# Patient Record
Sex: Male | Born: 1945 | Race: White | Hispanic: No | Marital: Married | Smoking: Never smoker
Health system: Southern US, Community
[De-identification: ages and names within clinical notes are randomized; demographics above are authoritative.]

## PROBLEM LIST (undated history)

## (undated) DIAGNOSIS — I509 Heart failure, unspecified: Secondary | ICD-10-CM

## (undated) DIAGNOSIS — J439 Emphysema, unspecified: Secondary | ICD-10-CM

## (undated) DIAGNOSIS — I2510 Atherosclerotic heart disease of native coronary artery without angina pectoris: Secondary | ICD-10-CM

## (undated) DIAGNOSIS — K2270 Barrett's esophagus without dysplasia: Secondary | ICD-10-CM

## (undated) DIAGNOSIS — A048 Other specified bacterial intestinal infections: Secondary | ICD-10-CM

## (undated) DIAGNOSIS — D132 Benign neoplasm of duodenum: Secondary | ICD-10-CM

## (undated) DIAGNOSIS — C884 Extranodal marginal zone b-cell lymphoma of mucosa-associated lymphoid tissue (malt-lymphoma) not having achieved remission: Secondary | ICD-10-CM

## (undated) DIAGNOSIS — R7303 Prediabetes: Secondary | ICD-10-CM

## (undated) DIAGNOSIS — K649 Unspecified hemorrhoids: Secondary | ICD-10-CM

## (undated) DIAGNOSIS — I5030 Unspecified diastolic (congestive) heart failure: Secondary | ICD-10-CM

## (undated) DIAGNOSIS — Z87448 Personal history of other diseases of urinary system: Secondary | ICD-10-CM

## (undated) DIAGNOSIS — I10 Essential (primary) hypertension: Secondary | ICD-10-CM

## (undated) DIAGNOSIS — N400 Enlarged prostate without lower urinary tract symptoms: Secondary | ICD-10-CM

## (undated) DIAGNOSIS — M109 Gout, unspecified: Secondary | ICD-10-CM

## (undated) DIAGNOSIS — K635 Polyp of colon: Secondary | ICD-10-CM

## (undated) DIAGNOSIS — K219 Gastro-esophageal reflux disease without esophagitis: Secondary | ICD-10-CM

## (undated) DIAGNOSIS — K449 Diaphragmatic hernia without obstruction or gangrene: Secondary | ICD-10-CM

## (undated) DIAGNOSIS — F419 Anxiety disorder, unspecified: Secondary | ICD-10-CM

## (undated) DIAGNOSIS — K259 Gastric ulcer, unspecified as acute or chronic, without hemorrhage or perforation: Secondary | ICD-10-CM

## (undated) DIAGNOSIS — I5032 Chronic diastolic (congestive) heart failure: Secondary | ICD-10-CM

## (undated) DIAGNOSIS — I700 Atherosclerosis of aorta: Secondary | ICD-10-CM

## (undated) DIAGNOSIS — K2970 Gastritis, unspecified, without bleeding: Secondary | ICD-10-CM

## (undated) HISTORY — DX: Diaphragmatic hernia without obstruction or gangrene: K44.9

## (undated) HISTORY — PX: OTHER SURGICAL HISTORY: SHX169

## (undated) HISTORY — PX: EYE SURGERY: SHX253

## (undated) HISTORY — DX: Polyp of colon: K63.5

## (undated) HISTORY — DX: Gastro-esophageal reflux disease without esophagitis: K21.9

## (undated) HISTORY — DX: Unspecified diastolic (congestive) heart failure: I50.30

## (undated) HISTORY — DX: Barrett's esophagus without dysplasia: K22.70

## (undated) HISTORY — PX: CORONARY ANGIOPLASTY: SHX604

## (undated) HISTORY — PX: TONSILLECTOMY: SUR1361

## (undated) HISTORY — PX: COLONOSCOPY: SHX174

## (undated) HISTORY — DX: Essential (primary) hypertension: I10

## (undated) HISTORY — DX: Gout, unspecified: M10.9

## (undated) HISTORY — DX: Atherosclerotic heart disease of native coronary artery without angina pectoris: I25.10

## (undated) HISTORY — PX: CARDIAC CATHETERIZATION: SHX172

---
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
## 2008-01-01 ENCOUNTER — Ambulatory Visit: Payer: Self-pay | Admitting: Gastroenterology

## 2008-03-25 ENCOUNTER — Emergency Department: Payer: Self-pay | Admitting: Emergency Medicine

## 2008-03-25 ENCOUNTER — Other Ambulatory Visit: Payer: Self-pay

## 2010-04-12 ENCOUNTER — Ambulatory Visit: Payer: Self-pay | Admitting: Family Medicine

## 2010-05-20 ENCOUNTER — Ambulatory Visit: Payer: Self-pay | Admitting: Cardiovascular Disease

## 2010-06-08 ENCOUNTER — Ambulatory Visit: Payer: Self-pay | Admitting: Family Medicine

## 2010-06-22 ENCOUNTER — Ambulatory Visit: Payer: Self-pay | Admitting: Gastroenterology

## 2010-06-24 LAB — PATHOLOGY REPORT

## 2011-02-26 ENCOUNTER — Emergency Department: Payer: Self-pay | Admitting: Internal Medicine

## 2011-03-02 ENCOUNTER — Ambulatory Visit: Payer: Self-pay | Admitting: Orthopedic Surgery

## 2011-03-07 ENCOUNTER — Ambulatory Visit: Payer: Self-pay | Admitting: Orthopedic Surgery

## 2011-11-27 ENCOUNTER — Ambulatory Visit: Payer: Self-pay | Admitting: Family Medicine

## 2012-01-03 DIAGNOSIS — I10 Essential (primary) hypertension: Secondary | ICD-10-CM | POA: Diagnosis not present

## 2012-03-13 DIAGNOSIS — R509 Fever, unspecified: Secondary | ICD-10-CM | POA: Diagnosis not present

## 2012-03-13 DIAGNOSIS — R51 Headache: Secondary | ICD-10-CM | POA: Diagnosis not present

## 2012-03-13 DIAGNOSIS — R070 Pain in throat: Secondary | ICD-10-CM | POA: Diagnosis not present

## 2012-03-13 DIAGNOSIS — J019 Acute sinusitis, unspecified: Secondary | ICD-10-CM | POA: Diagnosis not present

## 2012-03-20 DIAGNOSIS — R0982 Postnasal drip: Secondary | ICD-10-CM | POA: Diagnosis not present

## 2012-03-20 DIAGNOSIS — H9319 Tinnitus, unspecified ear: Secondary | ICD-10-CM | POA: Diagnosis not present

## 2012-03-20 DIAGNOSIS — J018 Other acute sinusitis: Secondary | ICD-10-CM | POA: Diagnosis not present

## 2012-03-20 DIAGNOSIS — H903 Sensorineural hearing loss, bilateral: Secondary | ICD-10-CM | POA: Diagnosis not present

## 2012-05-02 DIAGNOSIS — K219 Gastro-esophageal reflux disease without esophagitis: Secondary | ICD-10-CM | POA: Diagnosis not present

## 2012-05-02 DIAGNOSIS — Z0000 Encounter for general adult medical examination without abnormal findings: Secondary | ICD-10-CM | POA: Diagnosis not present

## 2012-05-02 DIAGNOSIS — I10 Essential (primary) hypertension: Secondary | ICD-10-CM | POA: Diagnosis not present

## 2012-05-02 DIAGNOSIS — Z125 Encounter for screening for malignant neoplasm of prostate: Secondary | ICD-10-CM | POA: Diagnosis not present

## 2012-05-02 DIAGNOSIS — K228 Other specified diseases of esophagus: Secondary | ICD-10-CM | POA: Diagnosis not present

## 2012-07-17 DIAGNOSIS — K219 Gastro-esophageal reflux disease without esophagitis: Secondary | ICD-10-CM | POA: Diagnosis not present

## 2012-10-29 DIAGNOSIS — Z23 Encounter for immunization: Secondary | ICD-10-CM | POA: Diagnosis not present

## 2012-10-29 DIAGNOSIS — I10 Essential (primary) hypertension: Secondary | ICD-10-CM | POA: Diagnosis not present

## 2012-11-26 DIAGNOSIS — E875 Hyperkalemia: Secondary | ICD-10-CM | POA: Diagnosis not present

## 2013-01-03 DIAGNOSIS — J019 Acute sinusitis, unspecified: Secondary | ICD-10-CM | POA: Diagnosis not present

## 2013-01-16 DIAGNOSIS — J018 Other acute sinusitis: Secondary | ICD-10-CM | POA: Diagnosis not present

## 2013-04-09 ENCOUNTER — Ambulatory Visit: Payer: Self-pay | Admitting: Gastroenterology

## 2013-04-09 DIAGNOSIS — Z79899 Other long term (current) drug therapy: Secondary | ICD-10-CM | POA: Diagnosis not present

## 2013-04-09 DIAGNOSIS — I10 Essential (primary) hypertension: Secondary | ICD-10-CM | POA: Diagnosis not present

## 2013-04-09 DIAGNOSIS — K5730 Diverticulosis of large intestine without perforation or abscess without bleeding: Secondary | ICD-10-CM | POA: Diagnosis not present

## 2013-04-09 DIAGNOSIS — Z7982 Long term (current) use of aspirin: Secondary | ICD-10-CM | POA: Diagnosis not present

## 2013-04-09 DIAGNOSIS — Z09 Encounter for follow-up examination after completed treatment for conditions other than malignant neoplasm: Secondary | ICD-10-CM | POA: Diagnosis not present

## 2013-04-09 DIAGNOSIS — K2270 Barrett's esophagus without dysplasia: Secondary | ICD-10-CM | POA: Diagnosis not present

## 2013-04-09 DIAGNOSIS — Z1211 Encounter for screening for malignant neoplasm of colon: Secondary | ICD-10-CM | POA: Diagnosis not present

## 2013-04-09 DIAGNOSIS — K219 Gastro-esophageal reflux disease without esophagitis: Secondary | ICD-10-CM | POA: Diagnosis not present

## 2013-04-09 DIAGNOSIS — K449 Diaphragmatic hernia without obstruction or gangrene: Secondary | ICD-10-CM | POA: Diagnosis not present

## 2013-04-09 DIAGNOSIS — Z86010 Personal history of colonic polyps: Secondary | ICD-10-CM | POA: Diagnosis not present

## 2013-04-10 LAB — PATHOLOGY REPORT

## 2013-05-07 DIAGNOSIS — Z0000 Encounter for general adult medical examination without abnormal findings: Secondary | ICD-10-CM | POA: Diagnosis not present

## 2013-05-07 DIAGNOSIS — M545 Low back pain: Secondary | ICD-10-CM | POA: Diagnosis not present

## 2013-05-07 DIAGNOSIS — K219 Gastro-esophageal reflux disease without esophagitis: Secondary | ICD-10-CM | POA: Diagnosis not present

## 2013-05-08 DIAGNOSIS — E785 Hyperlipidemia, unspecified: Secondary | ICD-10-CM | POA: Diagnosis not present

## 2013-05-08 DIAGNOSIS — I10 Essential (primary) hypertension: Secondary | ICD-10-CM | POA: Diagnosis not present

## 2013-05-08 DIAGNOSIS — Z0000 Encounter for general adult medical examination without abnormal findings: Secondary | ICD-10-CM | POA: Diagnosis not present

## 2013-05-08 DIAGNOSIS — Z125 Encounter for screening for malignant neoplasm of prostate: Secondary | ICD-10-CM | POA: Diagnosis not present

## 2013-07-28 DIAGNOSIS — M545 Low back pain: Secondary | ICD-10-CM | POA: Diagnosis not present

## 2013-10-14 DIAGNOSIS — M7650 Patellar tendinitis, unspecified knee: Secondary | ICD-10-CM | POA: Diagnosis not present

## 2013-11-03 DIAGNOSIS — S83419A Sprain of medial collateral ligament of unspecified knee, initial encounter: Secondary | ICD-10-CM | POA: Diagnosis not present

## 2013-11-04 DIAGNOSIS — S83419A Sprain of medial collateral ligament of unspecified knee, initial encounter: Secondary | ICD-10-CM | POA: Diagnosis not present

## 2013-11-05 DIAGNOSIS — I10 Essential (primary) hypertension: Secondary | ICD-10-CM | POA: Diagnosis not present

## 2013-11-05 DIAGNOSIS — Z23 Encounter for immunization: Secondary | ICD-10-CM | POA: Diagnosis not present

## 2013-11-11 DIAGNOSIS — S83419A Sprain of medial collateral ligament of unspecified knee, initial encounter: Secondary | ICD-10-CM | POA: Diagnosis not present

## 2013-11-13 DIAGNOSIS — S83419A Sprain of medial collateral ligament of unspecified knee, initial encounter: Secondary | ICD-10-CM | POA: Diagnosis not present

## 2013-11-18 DIAGNOSIS — S83419A Sprain of medial collateral ligament of unspecified knee, initial encounter: Secondary | ICD-10-CM | POA: Diagnosis not present

## 2013-11-20 DIAGNOSIS — S83419A Sprain of medial collateral ligament of unspecified knee, initial encounter: Secondary | ICD-10-CM | POA: Diagnosis not present

## 2013-12-02 DIAGNOSIS — M25569 Pain in unspecified knee: Secondary | ICD-10-CM | POA: Diagnosis not present

## 2013-12-02 DIAGNOSIS — M6281 Muscle weakness (generalized): Secondary | ICD-10-CM | POA: Diagnosis not present

## 2013-12-09 DIAGNOSIS — J019 Acute sinusitis, unspecified: Secondary | ICD-10-CM | POA: Diagnosis not present

## 2013-12-17 DIAGNOSIS — M1710 Unilateral primary osteoarthritis, unspecified knee: Secondary | ICD-10-CM | POA: Diagnosis not present

## 2013-12-17 DIAGNOSIS — S83419A Sprain of medial collateral ligament of unspecified knee, initial encounter: Secondary | ICD-10-CM | POA: Diagnosis not present

## 2014-01-06 ENCOUNTER — Emergency Department: Payer: Self-pay | Admitting: Emergency Medicine

## 2014-01-06 DIAGNOSIS — Z79899 Other long term (current) drug therapy: Secondary | ICD-10-CM | POA: Diagnosis not present

## 2014-01-06 DIAGNOSIS — I10 Essential (primary) hypertension: Secondary | ICD-10-CM | POA: Diagnosis not present

## 2014-01-06 DIAGNOSIS — R000 Tachycardia, unspecified: Secondary | ICD-10-CM | POA: Diagnosis not present

## 2014-01-06 DIAGNOSIS — R002 Palpitations: Secondary | ICD-10-CM | POA: Diagnosis not present

## 2014-01-06 LAB — CBC
HCT: 42.5 % (ref 40.0–52.0)
HGB: 14.6 g/dL (ref 13.0–18.0)
MCH: 32.4 pg (ref 26.0–34.0)
MCHC: 34.3 g/dL (ref 32.0–36.0)
MCV: 95 fL (ref 80–100)
PLATELETS: 260 10*3/uL (ref 150–440)
RBC: 4.5 10*6/uL (ref 4.40–5.90)
RDW: 13.6 % (ref 11.5–14.5)
WBC: 7.8 10*3/uL (ref 3.8–10.6)

## 2014-01-06 LAB — TSH: Thyroid Stimulating Horm: 1.2 u[IU]/mL

## 2014-01-06 LAB — BASIC METABOLIC PANEL
ANION GAP: 6 — AB (ref 7–16)
BUN: 22 mg/dL — ABNORMAL HIGH (ref 7–18)
CHLORIDE: 101 mmol/L (ref 98–107)
CO2: 27 mmol/L (ref 21–32)
Calcium, Total: 9.7 mg/dL (ref 8.5–10.1)
Creatinine: 1.13 mg/dL (ref 0.60–1.30)
EGFR (African American): >60
GLUCOSE: 113 mg/dL — AB (ref 65–99)
OSMOLALITY: 272 (ref 275–301)
POTASSIUM: 4 mmol/L (ref 3.5–5.1)
SODIUM: 134 mmol/L — AB (ref 136–145)

## 2014-01-06 LAB — T4, FREE: Free Thyroxine: 1.15 ng/dL (ref 0.76–1.46)

## 2014-01-26 DIAGNOSIS — I10 Essential (primary) hypertension: Secondary | ICD-10-CM | POA: Diagnosis not present

## 2014-02-04 DIAGNOSIS — J018 Other acute sinusitis: Secondary | ICD-10-CM | POA: Diagnosis not present

## 2014-03-10 DIAGNOSIS — I10 Essential (primary) hypertension: Secondary | ICD-10-CM | POA: Diagnosis not present

## 2014-05-12 DIAGNOSIS — Z125 Encounter for screening for malignant neoplasm of prostate: Secondary | ICD-10-CM | POA: Diagnosis not present

## 2014-05-12 DIAGNOSIS — Z23 Encounter for immunization: Secondary | ICD-10-CM | POA: Diagnosis not present

## 2014-05-12 DIAGNOSIS — Z0000 Encounter for general adult medical examination without abnormal findings: Secondary | ICD-10-CM | POA: Diagnosis not present

## 2014-05-12 DIAGNOSIS — I10 Essential (primary) hypertension: Secondary | ICD-10-CM | POA: Diagnosis not present

## 2014-11-10 DIAGNOSIS — I10 Essential (primary) hypertension: Secondary | ICD-10-CM | POA: Diagnosis not present

## 2014-11-10 DIAGNOSIS — Z23 Encounter for immunization: Secondary | ICD-10-CM | POA: Diagnosis not present

## 2015-05-17 ENCOUNTER — Encounter: Payer: Self-pay | Admitting: Family Medicine

## 2015-05-25 ENCOUNTER — Ambulatory Visit (INDEPENDENT_AMBULATORY_CARE_PROVIDER_SITE_OTHER): Payer: Medicare Other | Admitting: Family Medicine

## 2015-05-25 ENCOUNTER — Encounter: Payer: Self-pay | Admitting: Family Medicine

## 2015-05-25 VITALS — BP 132/78 | HR 92 | Temp 98.7°F | Ht 69.0 in | Wt 238.0 lb

## 2015-05-25 DIAGNOSIS — K2270 Barrett's esophagus without dysplasia: Secondary | ICD-10-CM | POA: Diagnosis not present

## 2015-05-25 DIAGNOSIS — Z125 Encounter for screening for malignant neoplasm of prostate: Secondary | ICD-10-CM

## 2015-05-25 DIAGNOSIS — I10 Essential (primary) hypertension: Secondary | ICD-10-CM | POA: Diagnosis not present

## 2015-05-25 DIAGNOSIS — Z0001 Encounter for general adult medical examination with abnormal findings: Secondary | ICD-10-CM | POA: Diagnosis not present

## 2015-05-25 DIAGNOSIS — F419 Anxiety disorder, unspecified: Secondary | ICD-10-CM | POA: Insufficient documentation

## 2015-05-25 DIAGNOSIS — M5441 Lumbago with sciatica, right side: Secondary | ICD-10-CM

## 2015-05-25 DIAGNOSIS — K229 Disease of esophagus, unspecified: Secondary | ICD-10-CM | POA: Insufficient documentation

## 2015-05-25 MED ORDER — METOPROLOL TARTRATE 25 MG PO TABS: 25.0000 mg | ORAL_TABLET | Freq: Two times a day (BID) | ORAL | Status: DC

## 2015-05-25 MED ORDER — CELECOXIB 200 MG PO CAPS: 200.0000 mg | ORAL_CAPSULE | Freq: Every day | ORAL | Status: DC

## 2015-05-25 MED ORDER — OLMESARTAN-AMLODIPINE-HCTZ 40-10-12.5 MG PO TABS: 1.0000 | ORAL_TABLET | Freq: Every morning | ORAL | Status: DC

## 2015-05-25 MED ORDER — LORAZEPAM 1 MG PO TABS: 1.0000 mg | ORAL_TABLET | Freq: Every day | ORAL | Status: DC | PRN

## 2015-05-25 NOTE — Assessment & Plan Note (Signed)
The current medical regimen is effective;  continue present plan and medications.  

## 2015-05-25 NOTE — Addendum Note (Signed)
Addended by: Amado Coe on: 05/25/2015 04:17 PM   Modules accepted: Orders

## 2015-05-25 NOTE — Progress Notes (Addendum)
BP 132/78 mmHg  Pulse 92  Temp(Src) 98.7 F (37.1 C)  Ht 5\' 9"  (1.753 m)  Wt 238 lb (107.956 kg)  BMI 35.13 kg/m2  SpO2 98%   Subjective:    Patient ID: Jesse Norton, male    DOB: [birth year], 69 y.o.   MRN: [id number]  HPI: Jesse Norton is a 69 y.o. male  Chief Complaint  Patient presents with  . Annual Exam  . Back Pain  rt lower back and hip worse with walking not with heavy back pack cellebrex has helped BP doing well meds taking every day and no side effects Reflux stable Anxiety l;orezapam did well and out of meds wants refill   Relevant past medical, surgical, family and social history reviewed and updated as indicated. Interim medical history since our last visit reviewed. Allergies and medications reviewed and updated.  Review of Systems  Constitutional: Negative.   HENT: Negative.   Eyes: Negative.   Respiratory: Negative.   Cardiovascular: Negative.   Gastrointestinal: Negative.   Endocrine: Negative.   Genitourinary: Negative.   Musculoskeletal: Negative.   Skin: Negative.   Allergic/Immunologic: Negative.   Neurological: Negative.   Hematological: Negative.   Psychiatric/Behavioral: Negative.     Per HPI unless specifically indicated above     Objective:    BP 132/78 mmHg  Pulse 92  Temp(Src) 98.7 F (37.1 C)  Ht 5\' 9"  (1.753 m)  Wt 238 lb (107.956 kg)  BMI 35.13 kg/m2  SpO2 98%  Wt Readings from Last 3 Encounters:  05/25/15 238 lb (107.956 kg)  11/10/14 239 lb (108.41 kg)  04/09/15 239 lb (108.41 kg)    Physical Exam  Constitutional: He is oriented to person, place, and time. He appears well-developed and well-nourished.  HENT:  Head: Normocephalic and atraumatic.  Right Ear: External ear normal.  Left Ear: External ear normal.  Eyes: Conjunctivae and EOM are normal. Pupils are equal, round, and reactive to light.  Neck: Normal range of motion. Neck supple.  Cardiovascular: Normal rate, regular rhythm, normal heart sounds  and intact distal pulses.   No murmur heard. Pulmonary/Chest: Effort normal and breath sounds normal.  Abdominal: Soft. Bowel sounds are normal. There is no splenomegaly or hepatomegaly.  Genitourinary: Rectum normal, prostate normal and penis normal.  Musculoskeletal: Normal range of motion.  Neurological: He is alert and oriented to person, place, and time. He has normal reflexes.  Skin: No rash noted. No erythema.  Psychiatric: He has a normal mood and affect. His behavior is normal. Judgment and thought content normal.        Assessment & Plan:   Problem List Items Addressed This Visit      Cardiovascular and Mediastinum   Hypertension - Primary    The current medical regimen is effective;  continue present plan and medications.       Relevant Medications   metoprolol tartrate (LOPRESSOR) 25 MG tablet   Olmesartan-Amlodipine-HCTZ 40-10-12.5 MG TABS   Other Relevant Orders   CBC with Differential/Platelet   Comprehensive metabolic panel   Lipid panel   TSH   Urinalysis, Routine w reflex microscopic (not at [facility])   Basic metabolic panel     Digestive   Barrett's esophagus    .Marland KitchenMarland KitchenThe current medical regimen is effective;  continue present plan and medications.       Relevant Orders   CBC with Differential/Platelet   Comprehensive metabolic panel   TSH   Urinalysis, Routine w reflex microscopic (not at [facility])  Other   Acute anxiety    Good tx needed for mother-in-law 78 lasted a year      Relevant Medications   LORazepam (ATIVAN) 1 MG tablet   Other Relevant Orders   CBC with Differential/Platelet   Comprehensive metabolic panel   TSH   Urinalysis, Routine w reflex microscopic (not at [facility])    Other Visit Diagnoses    Right-sided low back pain with right-sided sciatica        discussed cont celebrex and will get apt chiropractor     Relevant Medications    celecoxib (CELEBREX) 200 MG capsule    Prostate cancer screening        Screening for prostate  cancer            Follow up plan: Return in about 6 months (around 11/24/2015) for BP.

## 2015-05-25 NOTE — Addendum Note (Signed)
Addended byGolden Pop on: 05/25/2015 04:08 PM   Modules accepted: Miquel Dunn

## 2015-05-25 NOTE — Assessment & Plan Note (Signed)
Good tx needed for mother-in-law 29 lasted a year

## 2015-05-26 LAB — CBC WITH DIFFERENTIAL/PLATELET
BASOS: 1 %
Basophils Absolute: 0.1 10*3/uL (ref 0.0–0.2)
EOS (ABSOLUTE): 0.1 10*3/uL (ref 0.0–0.4)
EOS: 2 %
HEMATOCRIT: 42.5 % (ref 37.5–51.0)
HEMOGLOBIN: 14.6 g/dL (ref 12.6–17.7)
IMMATURE GRANS (ABS): 0 10*3/uL (ref 0.0–0.1)
IMMATURE GRANULOCYTES: 0 %
LYMPHS: 44 %
Lymphocytes Absolute: 3.2 10*3/uL — ABNORMAL HIGH (ref 0.7–3.1)
MCH: 32 pg (ref 26.6–33.0)
MCHC: 34.4 g/dL (ref 31.5–35.7)
MCV: 93 fL (ref 79–97)
MONOS ABS: 0.7 10*3/uL (ref 0.1–0.9)
Monocytes: 10 %
NEUTROS ABS: 3.2 10*3/uL (ref 1.4–7.0)
Neutrophils: 43 %
Platelets: 251 10*3/uL (ref 150–379)
RBC: 4.56 x10E6/uL (ref 4.14–5.80)
RDW: 14.4 % (ref 12.3–15.4)
WBC: 7.3 10*3/uL (ref 3.4–10.8)

## 2015-05-26 LAB — COMPREHENSIVE METABOLIC PANEL
ALBUMIN: 4.5 g/dL (ref 3.5–4.8)
ALT: 14 IU/L (ref 0–44)
AST: 22 IU/L (ref 0–40)
Albumin/Globulin Ratio: 2 (ref 1.1–2.5)
Alkaline Phosphatase: 47 IU/L (ref 39–117)
BUN/Creatinine Ratio: 22 (ref 10–22)
BUN: 22 mg/dL (ref 8–27)
Bilirubin Total: 0.6 mg/dL (ref 0.0–1.2)
CALCIUM: 10 mg/dL (ref 8.6–10.2)
CO2: 26 mmol/L (ref 18–29)
CREATININE: 1.02 mg/dL (ref 0.76–1.27)
Chloride: 98 mmol/L (ref 97–108)
GFR calc Af Amer: 86 mL/min/{1.73_m2} (ref >59)
GFR calc non Af Amer: 74 mL/min/{1.73_m2} (ref >59)
GLOBULIN, TOTAL: 2.3 g/dL (ref 1.5–4.5)
Glucose: 99 mg/dL (ref 65–99)
Potassium: 4.5 mmol/L (ref 3.5–5.2)
Sodium: 139 mmol/L (ref 134–144)
TOTAL PROTEIN: 6.8 g/dL (ref 6.0–8.5)

## 2015-05-26 LAB — URINALYSIS, ROUTINE W REFLEX MICROSCOPIC
BILIRUBIN UA: NEGATIVE
Glucose, UA: NEGATIVE
Ketones, UA: NEGATIVE
Leukocytes, UA: NEGATIVE
Nitrite, UA: NEGATIVE
Protein, UA: NEGATIVE
SPEC GRAV UA: 1.015 (ref 1.005–1.030)
UUROB: 0.2 mg/dL (ref 0.2–1.0)
pH, UA: 5 (ref 5.0–7.5)

## 2015-05-26 LAB — MICROSCOPIC EXAMINATION: WBC UA: NONE SEEN /HPF (ref 0–?)

## 2015-05-26 LAB — PSA: Prostate Specific Ag, Serum: 2.7 ng/mL (ref 0.0–4.0)

## 2015-05-26 LAB — LIPID PANEL
CHOLESTEROL TOTAL: 186 mg/dL (ref 100–199)
Chol/HDL Ratio: 3 ratio units (ref 0.0–5.0)
HDL: 62 mg/dL (ref >39)
LDL CALC: 109 mg/dL — AB (ref 0–99)
Triglycerides: 75 mg/dL (ref 0–149)
VLDL Cholesterol Cal: 15 mg/dL (ref 5–40)

## 2015-05-26 LAB — TSH: TSH: 1.61 u[IU]/mL (ref 0.450–4.500)

## 2015-10-21 ENCOUNTER — Encounter: Payer: Self-pay | Admitting: Family Medicine

## 2015-11-22 ENCOUNTER — Encounter: Payer: Self-pay | Admitting: Family Medicine

## 2015-11-22 ENCOUNTER — Ambulatory Visit (INDEPENDENT_AMBULATORY_CARE_PROVIDER_SITE_OTHER): Payer: Medicare Other | Admitting: Family Medicine

## 2015-11-22 VITALS — BP 136/80 | HR 79 | Temp 97.9°F | Ht 68.2 in | Wt 245.0 lb

## 2015-11-22 DIAGNOSIS — Z23 Encounter for immunization: Secondary | ICD-10-CM

## 2015-11-22 DIAGNOSIS — Z113 Encounter for screening for infections with a predominantly sexual mode of transmission: Secondary | ICD-10-CM | POA: Diagnosis not present

## 2015-11-22 DIAGNOSIS — I10 Essential (primary) hypertension: Secondary | ICD-10-CM

## 2015-11-22 DIAGNOSIS — M79675 Pain in left toe(s): Secondary | ICD-10-CM | POA: Diagnosis not present

## 2015-11-22 DIAGNOSIS — M25572 Pain in left ankle and joints of left foot: Secondary | ICD-10-CM

## 2015-11-22 DIAGNOSIS — F419 Anxiety disorder, unspecified: Secondary | ICD-10-CM

## 2015-11-22 MED ORDER — AMLODIPINE BESYLATE 10 MG PO TABS: 10.0000 mg | ORAL_TABLET | Freq: Every day | ORAL | Status: DC

## 2015-11-22 MED ORDER — LOSARTAN POTASSIUM-HCTZ 100-12.5 MG PO TABS: 1.0000 | ORAL_TABLET | Freq: Every day | ORAL | Status: DC

## 2015-11-22 MED ORDER — LORAZEPAM 1 MG PO TABS: 1.0000 mg | ORAL_TABLET | Freq: Every day | ORAL | Status: DC | PRN

## 2015-11-22 NOTE — Assessment & Plan Note (Signed)
The current medical regimen is effective;  continue present plan and medications. But because of cost will change into component medications Discussed taking metoprolol tartrate 2 tablets each morning patient will checks blood pressure in the evenings and see what it is and report back

## 2015-11-22 NOTE — Progress Notes (Signed)
BP 136/80 mmHg  Pulse 79  Temp(Src) 97.9 F (36.6 C)  Ht 5' 8.2" (1.732 m)  Wt 245 lb (111.131 kg)  BMI 37.05 kg/m2  SpO2 99%   Subjective:    Patient ID: Jesse Norton, male    DOB: 01-12-1946, 69 y.o.   MRN: [id number]  HPI: Jesse Norton is a 69 y.o. male  Chief Complaint  Patient presents with  . Hypertension   Doing well with blood pressure blood pressure medications no complaints or concerns. Taking medications without side effects except for tribenzor gotten too expensive even generic Patient also with left bunion gets somewhat red and inflamed sore and tender steak and Celebrex which is helped. No prior history of gout  Relevant past medical, surgical, family and social history reviewed and updated as indicated. Interim medical history since our last visit reviewed. Allergies and medications reviewed and updated.  Review of Systems  Constitutional: Negative.   Respiratory: Negative.   Cardiovascular: Negative.     Per HPI unless specifically indicated above     Objective:    BP 136/80 mmHg  Pulse 79  Temp(Src) 97.9 F (36.6 C)  Ht 5' 8.2" (1.732 m)  Wt 245 lb (111.131 kg)  BMI 37.05 kg/m2  SpO2 99%  Wt Readings from Last 3 Encounters:  11/22/15 245 lb (111.131 kg)  05/25/15 238 lb (107.956 kg)  11/10/14 239 lb (108.41 kg)    Physical Exam  Constitutional: He is oriented to person, place, and time. He appears well-developed and well-nourished. No distress.  HENT:  Head: Normocephalic and atraumatic.  Right Ear: Hearing normal.  Left Ear: Hearing normal.  Nose: Nose normal.  Eyes: Conjunctivae and lids are normal. Right eye exhibits no discharge. Left eye exhibits no discharge. No scleral icterus.  Cardiovascular: Normal rate, regular rhythm and normal heart sounds.   Pulmonary/Chest: Effort normal and breath sounds normal. No respiratory distress.  Musculoskeletal: Normal range of motion.  bunioin enlarged otherwise normal  Neurological:  He is alert and oriented to person, place, and time.  Skin: Skin is intact. No rash noted.  Psychiatric: He has a normal mood and affect. His speech is normal and behavior is normal. Judgment and thought content normal. Cognition and memory are normal.    Results for orders placed or performed in visit on 05/25/15  Microscopic Examination  Result Value Ref Range   WBC, UA None seen 0 -  5 /hpf   RBC, UA 0-2 0 -  2 /hpf   Epithelial Cells (non renal) 0-10 0 - 10 /hpf   Bacteria, UA Few None seen/Few  CBC with Differential/Platelet  Result Value Ref Range   WBC 7.3 3.4 - 10.8 x10E3/uL   RBC 4.56 4.14 - 5.80 x10E6/uL   Hemoglobin 14.6 12.6 - 17.7 g/dL   Hematocrit 42.5 37.5 - 51.0 %   MCV 93 79 - 97 fL   MCH 32.0 26.6 - 33.0 pg   MCHC 34.4 31.5 - 35.7 g/dL   RDW 14.4 12.3 - 15.4 %   Platelets 251 150 - 379 x10E3/uL   Neutrophils 43 %   Lymphs 44 %   Monocytes 10 %   Eos 2 %   Basos 1 %   Neutrophils Absolute 3.2 1.4 - 7.0 x10E3/uL   Lymphocytes Absolute 3.2 (H) 0.7 - 3.1 x10E3/uL   Monocytes Absolute 0.7 0.1 - 0.9 x10E3/uL   EOS (ABSOLUTE) 0.1 0.0 - 0.4 x10E3/uL   Basophils Absolute 0.1 0.0 - 0.2 x10E3/uL  Immature Granulocytes 0 %   Immature Grans (Abs) 0.0 0.0 - 0.1 x10E3/uL  Comprehensive metabolic panel  Result Value Ref Range   Glucose 99 65 - 99 mg/dL   BUN 22 8 - 27 mg/dL   Creatinine, Ser 1.02 0.76 - 1.27 mg/dL   GFR calc non Af Amer 74 >59 mL/min/1.73   GFR calc Af Amer 86 >59 mL/min/1.73   BUN/Creatinine Ratio 22 10 - 22   Sodium 139 134 - 144 mmol/L   Potassium 4.5 3.5 - 5.2 mmol/L   Chloride 98 97 - 108 mmol/L   CO2 26 18 - 29 mmol/L   Calcium 10.0 8.6 - 10.2 mg/dL   Total Protein 6.8 6.0 - 8.5 g/dL   Albumin 4.5 3.5 - 4.8 g/dL   Globulin, Total 2.3 1.5 - 4.5 g/dL   Albumin/Globulin Ratio 2.0 1.1 - 2.5   Bilirubin Total 0.6 0.0 - 1.2 mg/dL   Alkaline Phosphatase 47 39 - 117 IU/L   AST 22 0 - 40 IU/L   ALT 14 0 - 44 IU/L  Lipid panel  Result Value Ref  Range   Cholesterol, Total 186 100 - 199 mg/dL   Triglycerides 75 0 - 149 mg/dL   HDL 62 >39 mg/dL   VLDL Cholesterol Cal 15 5 - 40 mg/dL   LDL Calculated 109 (H) 0 - 99 mg/dL   Chol/HDL Ratio 3.0 0.0 - 5.0 ratio units  TSH  Result Value Ref Range   TSH 1.610 0.450 - 4.500 uIU/mL  Urinalysis, Routine w reflex microscopic (not at [facility])  Result Value Ref Range   Specific Gravity, UA 1.015 1.005 - 1.030   pH, UA 5.0 5.0 - 7.5   Color, UA Yellow Yellow   Appearance Ur Clear Clear   Leukocytes, UA Negative Negative   Protein, UA Negative Negative/Trace   Glucose, UA Negative Negative   Ketones, UA Negative Negative   RBC, UA 3+ (A) Negative   Bilirubin, UA Negative Negative   Urobilinogen, Ur 0.2 0.2 - 1.0 mg/dL   Nitrite, UA Negative Negative   Microscopic Examination See below:   PSA  Result Value Ref Range   Prostate Specific Ag, Serum 2.7 0.0 - 4.0 ng/mL      Assessment & Plan:   Problem List Items Addressed This Visit      Cardiovascular and Mediastinum   Essential hypertension    The current medical regimen is effective;  continue present plan and medications. But because of cost will change into component medications Discussed taking metoprolol tartrate 2 tablets each morning patient will checks blood pressure in the evenings and see what it is and report back      Relevant Medications   losartan-hydrochlorothiazide (HYZAAR) 100-12.5 MG tablet   amLODipine (NORVASC) 10 MG tablet     Other   Acute anxiety   Relevant Medications   LORazepam (ATIVAN) 1 MG tablet    Other Visit Diagnoses    Immunization due    -  Primary    Relevant Orders    Flu Vaccine QUAD 36+ mos PF IM (Fluarix & Fluzone Quad PF) (Completed)    Routine screening for STI (sexually transmitted infection)        Relevant Orders    Hepatitis C Antibody    Toe joint pain, left        Will check uric acid if uric acid normal diagnosis arthritis    Relevant Orders    Basic metabolic panel     Uric  acid        Follow up plan: Return in about 6 months (around 05/22/2016) for Physical Exam.

## 2015-11-23 ENCOUNTER — Telehealth: Payer: Self-pay | Admitting: Family Medicine

## 2015-11-23 LAB — BASIC METABOLIC PANEL
BUN / CREAT RATIO: 13 (ref 10–22)
BUN: 14 mg/dL (ref 8–27)
CO2: 26 mmol/L (ref 18–29)
CREATININE: 1.04 mg/dL (ref 0.76–1.27)
Calcium: 9.7 mg/dL (ref 8.6–10.2)
Chloride: 99 mmol/L (ref 96–106)
GFR, EST AFRICAN AMERICAN: 84 mL/min/{1.73_m2} (ref >59)
GFR, EST NON AFRICAN AMERICAN: 72 mL/min/{1.73_m2} (ref >59)
Glucose: 96 mg/dL (ref 65–99)
POTASSIUM: 4.9 mmol/L (ref 3.5–5.2)
SODIUM: 140 mmol/L (ref 134–144)

## 2015-11-23 LAB — HEPATITIS C ANTIBODY: HEP C VIRUS AB: 0.2 {s_co_ratio} (ref 0.0–0.9)

## 2015-11-23 LAB — URIC ACID: Uric Acid: 7.5 mg/dL (ref 3.7–8.6)

## 2015-11-23 MED ORDER — COLCHICINE 0.6 MG PO CAPS: 0.6000 mg | ORAL_CAPSULE | Freq: Every day | ORAL | Status: DC

## 2015-11-23 MED ORDER — ALLOPURINOL 300 MG PO TABS: 300.0000 mg | ORAL_TABLET | Freq: Every day | ORAL | Status: DC

## 2015-11-23 NOTE — Telephone Encounter (Signed)
-----   Message from Wynn Maudlin, [place] sent at 11/23/2015  4:56 PM EST ----- labs

## 2015-11-23 NOTE — Telephone Encounter (Signed)
Phone call Discussed with patient elevated uric acid Asian still with marked gout symptoms will start treatment discuss use of medication

## 2015-11-25 ENCOUNTER — Ambulatory Visit: Payer: Medicare Other | Admitting: Family Medicine

## 2016-02-09 ENCOUNTER — Emergency Department
Admission: EM | Admit: 2016-02-09 | Discharge: 2016-02-09 | Disposition: A | Discharge status: Home / Self Care | Payer: Medicare Other | Attending: Emergency Medicine | Admitting: Emergency Medicine

## 2016-02-09 ENCOUNTER — Emergency Department: Payer: Medicare Other

## 2016-02-09 DIAGNOSIS — F419 Anxiety disorder, unspecified: Secondary | ICD-10-CM | POA: Diagnosis not present

## 2016-02-09 DIAGNOSIS — M79642 Pain in left hand: Secondary | ICD-10-CM | POA: Insufficient documentation

## 2016-02-09 DIAGNOSIS — Z7982 Long term (current) use of aspirin: Secondary | ICD-10-CM | POA: Diagnosis not present

## 2016-02-09 DIAGNOSIS — I10 Essential (primary) hypertension: Secondary | ICD-10-CM | POA: Diagnosis not present

## 2016-02-09 DIAGNOSIS — Z79899 Other long term (current) drug therapy: Secondary | ICD-10-CM | POA: Diagnosis not present

## 2016-02-09 DIAGNOSIS — F439 Reaction to severe stress, unspecified: Secondary | ICD-10-CM | POA: Diagnosis not present

## 2016-02-09 DIAGNOSIS — I159 Secondary hypertension, unspecified: Secondary | ICD-10-CM

## 2016-02-09 DIAGNOSIS — F430 Acute stress reaction: Secondary | ICD-10-CM | POA: Diagnosis not present

## 2016-02-09 LAB — URINALYSIS COMPLETE WITH MICROSCOPIC (ARMC ONLY)
BACTERIA UA: NONE SEEN
Bilirubin Urine: NEGATIVE
Glucose, UA: NEGATIVE mg/dL
LEUKOCYTES UA: NEGATIVE
NITRITE: NEGATIVE
PH: 5 (ref 5.0–8.0)
PROTEIN: NEGATIVE mg/dL
SPECIFIC GRAVITY, URINE: 1.024 (ref 1.005–1.030)

## 2016-02-09 LAB — CBC
HCT: 42.5 % (ref 40.0–52.0)
HEMOGLOBIN: 14.4 g/dL (ref 13.0–18.0)
MCH: 32.5 pg (ref 26.0–34.0)
MCHC: 34 g/dL (ref 32.0–36.0)
MCV: 95.6 fL (ref 80.0–100.0)
PLATELETS: 249 10*3/uL (ref 150–440)
RBC: 4.44 MIL/uL (ref 4.40–5.90)
RDW: 14.4 % (ref 11.5–14.5)
WBC: 7.3 10*3/uL (ref 3.8–10.6)

## 2016-02-09 LAB — BASIC METABOLIC PANEL
Anion gap: 10 (ref 5–15)
BUN: 23 mg/dL — AB (ref 6–20)
CALCIUM: 9.8 mg/dL (ref 8.9–10.3)
CO2: 26 mmol/L (ref 22–32)
CREATININE: 1.2 mg/dL (ref 0.61–1.24)
Chloride: 101 mmol/L (ref 101–111)
GFR, EST NON AFRICAN AMERICAN: 59 mL/min — AB (ref >60)
Glucose, Bld: 120 mg/dL — ABNORMAL HIGH (ref 65–99)
Potassium: 3.6 mmol/L (ref 3.5–5.1)
SODIUM: 137 mmol/L (ref 135–145)

## 2016-02-09 LAB — TROPONIN I
TROPONIN I: 0.03 ng/mL (ref <0.031)
Troponin I: <0.03 ng/mL (ref <0.031)

## 2016-02-09 MED ORDER — LORAZEPAM 1 MG PO TABS: 1.0000 mg | ORAL_TABLET | Freq: Three times a day (TID) | ORAL | Status: DC | PRN

## 2016-02-09 NOTE — ED Notes (Signed)
Pt reports checking his BP yesterday and today and it being high. Pt denies any CP. Pt in no acute distress

## 2016-02-09 NOTE — ED Notes (Signed)
Discussed discharge instructions, prescriptions, and follow-up care with patient. No questions or concerns at this time. Pt stable at discharge.  

## 2016-02-09 NOTE — ED Provider Notes (Signed)
[facility] Emergency Department Provider Note  ____________________________________________  Time seen: Approximately 5:04 AM  I have reviewed the triage vital signs and the nursing notes.   HISTORY  Chief Complaint Hypertension    HPI ENDY MAZZAFERRO is a 70 y.o. male who presents to the ED from home with a chief complaint of elevated blood pressure. Patient reports his PCP changed his medications in December 2 medicines which are more affordable. He is taking amlodipine, Hyzaar and metoprolol daily. Reports increased stress this week and has been using his chainsaw for the past 3 days to saw wood in his yard.Baseline blood pressures 110-130/60s to 80s. Patient states he usually checks his blood pressure once weekly. Yesterday when he checked his pressure it was something in the 160/100 range. He awoke tonight approximately 12:30 AM sweating but states he was covered by a down comforter and sometimes gets too hot. Also noted aching and warm sensation in his left arm, but states he has had surgery in that arm before and occasionally notes aching sensation. The combination of his symptoms concerned him and he presented to the ED for further evaluation. Denies associated symptoms of fever, chills, headache, neck pain, vision changes, chest pain, shortness of breath, abdominal pain, nausea, vomiting, diarrhea, dysuria. Denies recent trauma or travel. Nothing makes the symptoms better or worse.   Past Medical History  Diagnosis Date  . Barrett's esophagus   . GERD (gastroesophageal reflux disease)     Patient Active Problem List   Diagnosis Date Noted  . Essential hypertension 05/25/2015  . Barrett's esophagus 05/25/2015  . Acute anxiety 05/25/2015    Past Surgical History  Procedure Laterality Date  . Tonsillectomy    . Left elbow repair Left   . Splenectomy  1957    Current Outpatient Rx  Name  Route  Sig  Dispense  Refill  . allopurinol (ZYLOPRIM) 300  MG tablet   Oral   Take 1 tablet (300 mg total) by mouth daily.   30 tablet   6   . amLODipine (NORVASC) 10 MG tablet   Oral   Take 1 tablet (10 mg total) by mouth daily.   90 tablet   1   . aspirin EC 81 MG tablet   Oral   Take 81 mg by mouth daily.         . celecoxib (CELEBREX) 200 MG capsule   Oral   Take 1 capsule (200 mg total) by mouth daily.   90 capsule   4   . Colchicine (MITIGARE) 0.6 MG CAPS   Oral   Take 0.6 mg by mouth daily. Take 2 now then 1 a day   30 capsule   4   . esomeprazole (NEXIUM) 40 MG capsule   Oral   Take 40 mg by mouth daily at 12 noon.         . fexofenadine (ALLEGRA) 180 MG tablet   Oral   Take 180 mg by mouth daily.         Marland Kitchen LORazepam (ATIVAN) 1 MG tablet   Oral   Take 1 tablet (1 mg total) by mouth daily as needed for anxiety.   30 tablet   1   . losartan-hydrochlorothiazide (HYZAAR) 100-12.5 MG tablet   Oral   Take 1 tablet by mouth daily.   90 tablet   1   . metoprolol tartrate (LOPRESSOR) 25 MG tablet   Oral   Take 1 tablet (25 mg total) by mouth  2 (two) times daily.   90 tablet   4   . Multiple Vitamin (MULTIVITAMIN) tablet   Oral   Take 1 tablet by mouth daily.           Allergies Review of patient's allergies indicates no known allergies.  Family History  Problem Relation Age of Onset  . Adopted: Yes    Social History Social History  Substance Use Topics  . Smoking status: Never Smoker   . Smokeless tobacco: Never Used  . Alcohol Use: 3.0 oz/week    5 Glasses of wine per week    Review of Systems  Constitutional: No fever/chills. Eyes: No visual changes. ENT: No sore throat. Cardiovascular: Denies chest pain. Respiratory: Denies shortness of breath. Gastrointestinal: No abdominal pain.  No nausea, no vomiting.  No diarrhea.  No constipation. Genitourinary: Negative for dysuria. Musculoskeletal: Positive for LUE pain. Negative for back pain. Skin: Negative for rash. Neurological:  Negative for headaches, focal weakness or numbness.  10-point ROS otherwise negative.  ____________________________________________   PHYSICAL EXAM:  VITAL SIGNS: ED Triage Vitals  Enc Vitals Group     BP 02/09/16 0208 156/88 mmHg     Pulse Rate 02/09/16 0208 90     Resp 02/09/16 0208 18     Temp 02/09/16 0208 98.2 F (36.8 C)     Temp Source 02/09/16 0208 Oral     SpO2 02/09/16 0208 97 %     Weight 02/09/16 0206 220 lb (99.791 kg)     Height 02/09/16 0206 5\' 11"  (1.803 m)     Head Cir --      Peak Flow --      Pain Score --      Pain Loc --      Pain Edu? --      Excl. in [place]? --     Constitutional: Alert and oriented. Well appearing and in no acute distress. Eyes: Conjunctivae are normal. PERRL. EOMI. Head: Atraumatic. Nose: No congestion/rhinnorhea. Mouth/Throat: Mucous membranes are moist.  Oropharynx non-erythematous. Neck: No stridor.  No carotid bruits. Cardiovascular: Normal rate, regular rhythm. Grossly normal heart sounds.  Good peripheral circulation. Respiratory: Normal respiratory effort.  No retractions. Lungs CTAB. Gastrointestinal: Soft and nontender. No distention. No abdominal bruits. No CVA tenderness. Musculoskeletal: No lower extremity tenderness nor edema.  No joint effusions. Neurologic:  Normal speech and language. No gross focal neurologic deficits are appreciated. No gait instability. Skin:  Skin is warm, dry and intact. No rash noted. Psychiatric: Mood and affect are normal. Speech and behavior are normal.  ____________________________________________   LABS (all labs ordered are listed, but only abnormal results are displayed)  Labs Reviewed  BASIC METABOLIC PANEL - Abnormal; Notable for the following:    Glucose, Bld 120 (*)    BUN 23 (*)    GFR calc non Af Amer 59 (*)    All other components within normal limits  URINALYSIS COMPLETEWITH MICROSCOPIC ([facility] ONLY) - Abnormal; Notable for the following:    Color, Urine YELLOW (*)     APPearance CLEAR (*)    Ketones, ur TRACE (*)    Hgb urine dipstick 2+ (*)    Squamous Epithelial / LPF 0-5 (*)    All other components within normal limits  CBC  TROPONIN I  TROPONIN I   ____________________________________________  EKG  ED ECG REPORT I, SUNG,JADE J, the attending physician, personally viewed and interpreted this ECG.   Date: 02/09/2016  EKG Time: 0208  Rate: 91  Rhythm:  normal EKG, normal sinus rhythm  Axis: Normal  Intervals:none  ST&T Change: Nonspecific  ____________________________________________  RADIOLOGY  Chest 2 view (view by me, interpreted per Dr. Dorann Lodge): Bibasilar atelectasis. ___________________________________________   PROCEDURES  Procedure(s) performed: None  Critical Care performed: No  ____________________________________________   INITIAL IMPRESSION / ASSESSMENT AND PLAN / ED COURSE  Pertinent labs & imaging results that were available during my care of the patient were reviewed by me and considered in my medical decision making (see chart for details).  70 year old male with a history of hypertension who presents for elevated blood pressure from his baseline. He is currently asymptomatic and resting in no acute distress. Initial laboratory results unremarkable including negative troponin. Will add chest x-ray, urinalysis and obtain repeat timed troponin.  ----------------------------------------- 6:37 AM on 02/09/2016 -----------------------------------------  Patient resting in no acute distress. Voices no complaints of chest pain, shortness of breath or arm pain. Discussed at length; patient appears to have had more stress and anxiety since he has retired. Will write limited prescription for low-dose benzodiazepine. He is to keep a log of his blood pressure medicines and bring them to his doctor for follow-up next week. He is due for his morning medications which he will take once he gets home. Strict return precautions  given. Patient and spouse verbalize understanding and agree with plan of care.  ____________________________________________   FINAL CLINICAL IMPRESSION(S) / ED DIAGNOSES  Final diagnoses:  Secondary hypertension, unspecified  Stress  Anxiety      Paulette Blanch, MD 02/09/16 [phone]

## 2016-02-09 NOTE — ED Notes (Signed)
Pt states had bp meds changed in January and he checks it regularly and states was elevated yesterday.  States no chest pain at this time, states left arm "feels warm".  Denies any shob at this time, no diaphoresis.

## 2016-02-09 NOTE — Discharge Instructions (Signed)
1. Take your blood pressure around the same time day and evening; keep a log to bring to your doctor.  2. You may take anxiety medicine as needed for stress/anxiety (Ativan #15). 3. Return to the ER for worsening symptoms, persistent vomiting, difficulty breathing, chest pain or other concerns.  Hypertension Hypertension, commonly called high blood pressure, is when the force of blood pumping through your arteries is too strong. Your arteries are the blood vessels that carry blood from your heart throughout your body. A blood pressure reading consists of a higher number over a lower number, such as 110/72. The higher number (systolic) is the pressure inside your arteries when your heart pumps. The lower number (diastolic) is the pressure inside your arteries when your heart relaxes. Ideally you want your blood pressure below 120/80. Hypertension forces your heart to work harder to pump blood. Your arteries may become narrow or stiff. Having untreated or uncontrolled hypertension can cause heart attack, stroke, kidney disease, and other problems. RISK FACTORS Some risk factors for high blood pressure are controllable. Others are not.  Risk factors you cannot control include:   Race. You may be at higher risk if you are African American.  Age. Risk increases with age.  Gender. Men are at higher risk than women before age 31 years. After age 64, women are at higher risk than men. Risk factors you can control include:  Not getting enough exercise or physical activity.  Being overweight.  Getting too much fat, sugar, calories, or salt in your diet.  Drinking too much alcohol. SIGNS AND SYMPTOMS Hypertension does not usually cause signs or symptoms. Extremely high blood pressure (hypertensive crisis) may cause headache, anxiety, shortness of breath, and nosebleed. DIAGNOSIS To check if you have hypertension, your health care provider will measure your blood pressure while you are seated, with  your arm held at the level of your heart. It should be measured at least twice using the same arm. Certain conditions can cause a difference in blood pressure between your right and left arms. A blood pressure reading that is higher than normal on one occasion does not mean that you need treatment. If it is not clear whether you have high blood pressure, you may be asked to return on a different day to have your blood pressure checked again. Or, you may be asked to monitor your blood pressure at home for 1 or more weeks. TREATMENT Treating high blood pressure includes making lifestyle changes and possibly taking medicine. Living a healthy lifestyle can help lower high blood pressure. You may need to change some of your habits. Lifestyle changes may include:  Following the DASH diet. This diet is high in fruits, vegetables, and whole grains. It is low in salt, red meat, and added sugars.  Keep your sodium intake below 2,300 mg per day.  Getting at least 30-45 minutes of aerobic exercise at least 4 times per week.  Losing weight if necessary.  Not smoking.  Limiting alcoholic beverages.  Learning ways to reduce stress. Your health care provider may prescribe medicine if lifestyle changes are not enough to get your blood pressure under control, and if one of the following is true:  You are 45-17 years of age and your systolic blood pressure is above 140.  You are 40 years of age or older, and your systolic blood pressure is above 150.  Your diastolic blood pressure is above 90.  You have diabetes, and your systolic blood pressure is over 140 or  your diastolic blood pressure is over 90.  You have kidney disease and your blood pressure is above 140/90.  You have heart disease and your blood pressure is above 140/90. Your personal target blood pressure may vary depending on your medical conditions, your age, and other factors. HOME CARE INSTRUCTIONS  Have your blood pressure rechecked as  directed by your health care provider.   Take medicines only as directed by your health care provider. Follow the directions carefully. Blood pressure medicines must be taken as prescribed. The medicine does not work as well when you skip doses. Skipping doses also puts you at risk for problems.  Do not smoke.   Monitor your blood pressure at home as directed by your health care provider. SEEK MEDICAL CARE IF:   You think you are having a reaction to medicines taken.  You have recurrent headaches or feel dizzy.  You have swelling in your ankles.  You have trouble with your vision. SEEK IMMEDIATE MEDICAL CARE IF:  You develop a severe headache or confusion.  You have unusual weakness, numbness, or feel faint.  You have severe chest or abdominal pain.  You vomit repeatedly.  You have trouble breathing. MAKE SURE YOU:   Understand these instructions.  Will watch your condition.  Will get help right away if you are not doing well or get worse.   This information is not intended to replace advice given to you by your health care provider. Make sure you discuss any questions you have with your health care provider.   Document Released: 11/27/2005 Document Revised: 04/13/2015 Document Reviewed: 09/19/2013 Elsevier Interactive Patient Education 2016 [employer] and Stress Management Stress is a normal reaction to life events. It is what you feel when life demands more than you are used to or more than you can handle. Some stress can be useful. For example, the stress reaction can help you catch the last bus of the day, study for a test, or meet a deadline at work. But stress that occurs too often or for too long can cause problems. It can affect your emotional health and interfere with relationships and normal daily activities. Too much stress can weaken your immune system and increase your risk for physical illness. If you already have a medical problem, stress can  make it worse. CAUSES  All sorts of life events may cause stress. An event that causes stress for one person may not be stressful for another person. Major life events commonly cause stress. These may be positive or negative. Examples include losing your job, moving into a new home, getting married, having a baby, or losing a loved one. Less obvious life events may also cause stress, especially if they occur day after day or in combination. Examples include working long hours, driving in traffic, caring for children, being in debt, or being in a difficult relationship. SIGNS AND SYMPTOMS Stress may cause emotional symptoms including, the following:  Anxiety. This is feeling worried, afraid, on edge, overwhelmed, or out of control.  Anger. This is feeling irritated or impatient.  Depression. This is feeling sad, down, helpless, or guilty.  Difficulty focusing, remembering, or making decisions. Stress may cause physical symptoms, including the following:   Aches and pains. These may affect your head, neck, back, stomach, or other areas of your body.  Tight muscles or clenched jaw.  Low energy or trouble sleeping. Stress may cause unhealthy behaviors, including the following:   Eating to feel better (overeating) or  skipping meals.  Sleeping too little, too much, or both.  Working too much or putting off tasks (procrastination).  Smoking, drinking alcohol, or using drugs to feel better. DIAGNOSIS  Stress is diagnosed through an assessment by your health care provider. Your health care provider will ask questions about your symptoms and any stressful life events.Your health care provider will also ask about your medical history and may order blood tests or other tests. Certain medical conditions and medicine can cause physical symptoms similar to stress. Mental illness can cause emotional symptoms and unhealthy behaviors similar to stress. Your health care provider may refer you to a  mental health professional for further evaluation.  TREATMENT  Stress management is the recommended treatment for stress.The goals of stress management are reducing stressful life events and coping with stress in healthy ways.  Techniques for reducing stressful life events include the following:  Stress identification. Self-monitor for stress and identify what causes stress for you. These skills may help you to avoid some stressful events.  Time management. Set your priorities, keep a calendar of events, and learn to say "no." These tools can help you avoid making too many commitments. Techniques for coping with stress include the following:  Rethinking the problem. Try to think realistically about stressful events rather than ignoring them or overreacting. Try to find the positives in a stressful situation rather than focusing on the negatives.  Exercise. Physical exercise can release both physical and emotional tension. The key is to find a form of exercise you enjoy and do it regularly.  Relaxation techniques. These relax the body and mind. Examples include yoga, meditation, tai chi, biofeedback, deep breathing, progressive muscle relaxation, listening to music, being out in nature, journaling, and other hobbies. Again, the key is to find one or more that you enjoy and can do regularly.  Healthy lifestyle. Eat a balanced diet, get plenty of sleep, and do not smoke. Avoid using alcohol or drugs to relax.  Strong support network. Spend time with family, friends, or other people you enjoy being around.Express your feelings and talk things over with someone you trust. Counseling or talktherapy with a mental health professional may be helpful if you are having difficulty managing stress on your own. Medicine is typically not recommended for the treatment of stress.Talk to your health care provider if you think you need medicine for symptoms of stress. HOME CARE INSTRUCTIONS  Keep all  follow-up visits as directed by your health care provider.  Take all medicines as directed by your health care provider. SEEK MEDICAL CARE IF:  Your symptoms get worse or you start having new symptoms.  You feel overwhelmed by your problems and can no longer manage them on your own. SEEK IMMEDIATE MEDICAL CARE IF:  You feel like hurting yourself or someone else.   This information is not intended to replace advice given to you by your health care provider. Make sure you discuss any questions you have with your health care provider.   Document Released: 05/23/2001 Document Revised: 12/18/2014 Document Reviewed: 07/22/2013 Elsevier Interactive Patient Education [employer].

## 2016-02-14 ENCOUNTER — Encounter: Payer: Self-pay | Admitting: Family Medicine

## 2016-02-14 ENCOUNTER — Ambulatory Visit (INDEPENDENT_AMBULATORY_CARE_PROVIDER_SITE_OTHER): Payer: Medicare Other | Admitting: Family Medicine

## 2016-02-14 VITALS — BP 148/90 | HR 76 | Temp 97.4°F | Ht 68.1 in | Wt 247.0 lb

## 2016-02-14 DIAGNOSIS — I10 Essential (primary) hypertension: Secondary | ICD-10-CM | POA: Diagnosis not present

## 2016-02-14 DIAGNOSIS — F419 Anxiety disorder, unspecified: Secondary | ICD-10-CM | POA: Diagnosis not present

## 2016-02-14 MED ORDER — LORAZEPAM 1 MG PO TABS: 1.0000 mg | ORAL_TABLET | Freq: Three times a day (TID) | ORAL | Status: DC

## 2016-02-14 MED ORDER — LORAZEPAM 1 MG PO TABS: 0.5000 mg | ORAL_TABLET | Freq: Every day | ORAL | Status: DC | PRN

## 2016-02-14 MED ORDER — LOSARTAN POTASSIUM-HCTZ 100-25 MG PO TABS
1.0000 | ORAL_TABLET | Freq: Every day | ORAL | Status: DC
Start: 2016-02-14 — End: 2016-05-30

## 2016-02-14 NOTE — Assessment & Plan Note (Signed)
Discussed blood pressure and stress response stress management techniques with exercise lifestyle diet Discuss unavoidable nature of stress over the next short-term We will modify risk for now though by increasing losartan HCT to 100/25 Discussed taking other medications metoprolol tartrate twice a day

## 2016-02-14 NOTE — Progress Notes (Signed)
BP 148/90 mmHg  Pulse 76  Temp(Src) 97.4 F (36.3 C)  Ht 5' 8.1" (1.73 m)  Wt 247 lb (112.038 kg)  BMI 37.43 kg/m2  SpO2 99%   Subjective:    Patient ID: Jesse Norton, male    DOB: [birth year], 70 y.o.   MRN: [id number]  HPI: Jesse Norton is a 70 y.o. male  Chief Complaint  Patient presents with  . Hypertension    spiked high last week   Patient reviews been under a great deal of stress with mother dying manage homes and regular activities of daily living blood pressures been elevated as treated and released from the emergency room which notes were reviewed patient's ultimate diagnosis was anxiety with resulting elevated blood pressure Patient's been checking his blood pressure with higher readings in the afternoons in the morning patient's taking his metoprolol tartrate 2 in the evenings. Has used some lorazepam which seemed to help him stormy days. Patient's had a lot of stormy days lately Relevant past medical, surgical, family and social history reviewed and updated as indicated. Interim medical history since our last visit reviewed. Allergies and medications reviewed and updated.  Review of Systems  Constitutional: Negative.   Respiratory: Negative.   Cardiovascular: Negative.     Per HPI unless specifically indicated above     Objective:    BP 148/90 mmHg  Pulse 76  Temp(Src) 97.4 F (36.3 C)  Ht 5' 8.1" (1.73 m)  Wt 247 lb (112.038 kg)  BMI 37.43 kg/m2  SpO2 99%  Wt Readings from Last 3 Encounters:  02/14/16 247 lb (112.038 kg)  02/09/16 220 lb (99.791 kg)  11/22/15 245 lb (111.131 kg)    Physical Exam  Constitutional: He is oriented to person, place, and time. He appears well-developed and well-nourished. No distress.  HENT:  Head: Normocephalic and atraumatic.  Right Ear: Hearing normal.  Left Ear: Hearing normal.  Nose: Nose normal.  Eyes: Conjunctivae and lids are normal. Right eye exhibits no discharge. Left eye exhibits no discharge. No  scleral icterus.  Cardiovascular: Normal rate, regular rhythm and normal heart sounds.   Pulmonary/Chest: Effort normal and breath sounds normal. No respiratory distress.  Musculoskeletal: Normal range of motion.  Neurological: He is alert and oriented to person, place, and time.  Skin: Skin is intact. No rash noted.  Psychiatric: He has a normal mood and affect. His speech is normal and behavior is normal. Judgment and thought content normal. Cognition and memory are normal.    Results for orders placed or performed during the hospital encounter of 02/09/16  CBC  Result Value Ref Range   WBC 7.3 3.8 - 10.6 K/uL   RBC 4.44 4.40 - 5.90 MIL/uL   Hemoglobin 14.4 13.0 - 18.0 g/dL   HCT 42.5 40.0 - 52.0 %   MCV 95.6 80.0 - 100.0 fL   MCH 32.5 26.0 - 34.0 pg   MCHC 34.0 32.0 - 36.0 g/dL   RDW 14.4 11.5 - 14.5 %   Platelets 249 150 - 440 K/uL  Basic metabolic panel  Result Value Ref Range   Sodium 137 135 - 145 mmol/L   Potassium 3.6 3.5 - 5.1 mmol/L   Chloride 101 101 - 111 mmol/L   CO2 26 22 - 32 mmol/L   Glucose, Bld 120 (H) 65 - 99 mg/dL   BUN 23 (H) 6 - 20 mg/dL   Creatinine, Ser 1.20 0.61 - 1.24 mg/dL   Calcium 9.8 8.9 - 10.3 mg/dL  GFR calc non Af Amer 59 (L) >60 mL/min   GFR calc Af Amer >60 >60 mL/min   Anion gap 10 5 - 15  Troponin I  Result Value Ref Range   Troponin I 0.03 <0.031 ng/mL  Urinalysis complete, with microscopic ([facility] only)  Result Value Ref Range   Color, Urine YELLOW (A) YELLOW   APPearance CLEAR (A) CLEAR   Glucose, UA NEGATIVE NEGATIVE mg/dL   Bilirubin Urine NEGATIVE NEGATIVE   Ketones, ur TRACE (A) NEGATIVE mg/dL   Specific Gravity, Urine 1.024 1.005 - 1.030   Hgb urine dipstick 2+ (A) NEGATIVE   pH 5.0 5.0 - 8.0   Protein, ur NEGATIVE NEGATIVE mg/dL   Nitrite NEGATIVE NEGATIVE   Leukocytes, UA NEGATIVE NEGATIVE   RBC / HPF 0-5 0 - 5 RBC/hpf   WBC, UA 0-5 0 - 5 WBC/hpf   Bacteria, UA NONE SEEN NONE SEEN   Squamous Epithelial / LPF 0-5 (A)  NONE SEEN   Mucous PRESENT    Hyaline Casts, UA PRESENT   Troponin I  Result Value Ref Range   Troponin I <0.03 <0.031 ng/mL      Assessment & Plan:   Problem List Items Addressed This Visit      Cardiovascular and Mediastinum   Essential hypertension - Primary    Discussed blood pressure and stress response stress management techniques with exercise lifestyle diet Discuss unavoidable nature of stress over the next short-term We will modify risk for now though by increasing losartan HCT to 100/25 Discussed taking other medications metoprolol tartrate twice a day      Relevant Medications   losartan-hydrochlorothiazide (HYZAAR) 100-25 MG tablet     Other   Acute anxiety    Discussed limited use meds      Relevant Medications   LORazepam (ATIVAN) 1 MG tablet       Follow up plan: Return in about 4 weeks (around 03/13/2016) for BP check.

## 2016-02-14 NOTE — Assessment & Plan Note (Signed)
Discussed limited use meds

## 2016-03-21 ENCOUNTER — Ambulatory Visit (INDEPENDENT_AMBULATORY_CARE_PROVIDER_SITE_OTHER): Payer: Medicare Other | Admitting: Family Medicine

## 2016-03-21 ENCOUNTER — Encounter: Payer: Self-pay | Admitting: Family Medicine

## 2016-03-21 VITALS — BP 135/80 | HR 72 | Temp 97.5°F | Ht 68.3 in | Wt 247.0 lb

## 2016-03-21 DIAGNOSIS — M10072 Idiopathic gout, left ankle and foot: Secondary | ICD-10-CM | POA: Diagnosis not present

## 2016-03-21 DIAGNOSIS — I10 Essential (primary) hypertension: Secondary | ICD-10-CM

## 2016-03-21 DIAGNOSIS — M109 Gout, unspecified: Secondary | ICD-10-CM | POA: Insufficient documentation

## 2016-03-21 NOTE — Progress Notes (Signed)
BP 135/80 mmHg  Pulse 72  Temp(Src) 97.5 F (36.4 C)  Ht 5' 8.3" (1.735 m)  Wt 247 lb (112.038 kg)  BMI 37.22 kg/m2  SpO2 99%   Subjective:    Patient ID: Jesse Norton, male    DOB: 07-08-1946, 70 y.o.   MRN: [id number]  HPI: NASIF LEN is a 70 y.o. male  Chief Complaint  Patient presents with  . Hypertension  . clarify gout medications  Patient recheck hypertension doing well with medications no complaints home blood pressure monitoring indicating good control. Patient not taking colchicine or allopurinol some confusion over what and went to take.  Relevant past medical, surgical, family and social history reviewed and updated as indicated. Interim medical history since our last visit reviewed. Allergies and medications reviewed and updated.  Review of Systems  Constitutional: Negative.   Respiratory: Negative.   Cardiovascular: Negative.     Per HPI unless specifically indicated above     Objective:    BP 135/80 mmHg  Pulse 72  Temp(Src) 97.5 F (36.4 C)  Ht 5' 8.3" (1.735 m)  Wt 247 lb (112.038 kg)  BMI 37.22 kg/m2  SpO2 99%  Wt Readings from Last 3 Encounters:  03/21/16 247 lb (112.038 kg)  02/14/16 247 lb (112.038 kg)  02/09/16 220 lb (99.791 kg)    Physical Exam  Constitutional: He is oriented to person, place, and time. He appears well-developed and well-nourished. No distress.  HENT:  Head: Normocephalic and atraumatic.  Right Ear: Hearing normal.  Left Ear: Hearing normal.  Nose: Nose normal.  Eyes: Conjunctivae and lids are normal. Right eye exhibits no discharge. Left eye exhibits no discharge. No scleral icterus.  Cardiovascular: Normal rate, regular rhythm and normal heart sounds.   Pulmonary/Chest: Effort normal and breath sounds normal. No respiratory distress.  Musculoskeletal: Normal range of motion.  Neurological: He is alert and oriented to person, place, and time.  Skin: Skin is intact. No rash noted.  Psychiatric: He has  a normal mood and affect. His speech is normal and behavior is normal. Judgment and thought content normal. Cognition and memory are normal.    Results for orders placed or performed during the hospital encounter of 02/09/16  CBC  Result Value Ref Range   WBC 7.3 3.8 - 10.6 K/uL   RBC 4.44 4.40 - 5.90 MIL/uL   Hemoglobin 14.4 13.0 - 18.0 g/dL   HCT 42.5 40.0 - 52.0 %   MCV 95.6 80.0 - 100.0 fL   MCH 32.5 26.0 - 34.0 pg   MCHC 34.0 32.0 - 36.0 g/dL   RDW 14.4 11.5 - 14.5 %   Platelets 249 150 - 440 K/uL  Basic metabolic panel  Result Value Ref Range   Sodium 137 135 - 145 mmol/L   Potassium 3.6 3.5 - 5.1 mmol/L   Chloride 101 101 - 111 mmol/L   CO2 26 22 - 32 mmol/L   Glucose, Bld 120 (H) 65 - 99 mg/dL   BUN 23 (H) 6 - 20 mg/dL   Creatinine, Ser 1.20 0.61 - 1.24 mg/dL   Calcium 9.8 8.9 - 10.3 mg/dL   GFR calc non Af Amer 59 (L) >60 mL/min   GFR calc Af Amer >60 >60 mL/min   Anion gap 10 5 - 15  Troponin I  Result Value Ref Range   Troponin I 0.03 <0.031 ng/mL  Urinalysis complete, with microscopic ([facility] only)  Result Value Ref Range   Color, Urine YELLOW (A) YELLOW  APPearance CLEAR (A) CLEAR   Glucose, UA NEGATIVE NEGATIVE mg/dL   Bilirubin Urine NEGATIVE NEGATIVE   Ketones, ur TRACE (A) NEGATIVE mg/dL   Specific Gravity, Urine 1.024 1.005 - 1.030   Hgb urine dipstick 2+ (A) NEGATIVE   pH 5.0 5.0 - 8.0   Protein, ur NEGATIVE NEGATIVE mg/dL   Nitrite NEGATIVE NEGATIVE   Leukocytes, UA NEGATIVE NEGATIVE   RBC / HPF 0-5 0 - 5 RBC/hpf   WBC, UA 0-5 0 - 5 WBC/hpf   Bacteria, UA NONE SEEN NONE SEEN   Squamous Epithelial / LPF 0-5 (A) NONE SEEN   Mucous PRESENT    Hyaline Casts, UA PRESENT   Troponin I  Result Value Ref Range   Troponin I <0.03 <0.031 ng/mL      Assessment & Plan:   Problem List Items Addressed This Visit      Cardiovascular and Mediastinum   Essential hypertension - Primary    The current medical regimen is effective;  continue present plan and  medications.       Relevant Orders   Basic metabolic panel     Other   Gout    Discuss gout care and treatment use of colchicine first for 2 months followed by allopurinol in 1-2 weeks with long-term use of allopurinol stopping colchicine after about 2 months.      Relevant Orders   Uric acid       Follow up plan: Return for Physical Exam, As scheduled.

## 2016-03-21 NOTE — Assessment & Plan Note (Signed)
The current medical regimen is effective;  continue present plan and medications.  

## 2016-03-21 NOTE — Assessment & Plan Note (Signed)
Discuss gout care and treatment use of colchicine first for 2 months followed by allopurinol in 1-2 weeks with long-term use of allopurinol stopping colchicine after about 2 months.

## 2016-03-22 ENCOUNTER — Encounter: Payer: Self-pay | Admitting: Family Medicine

## 2016-03-22 LAB — BASIC METABOLIC PANEL
BUN / CREAT RATIO: 20 (ref 10–24)
BUN: 21 mg/dL (ref 8–27)
CO2: 27 mmol/L (ref 18–29)
CREATININE: 1.06 mg/dL (ref 0.76–1.27)
Calcium: 9.6 mg/dL (ref 8.6–10.2)
Chloride: 99 mmol/L (ref 96–106)
GFR, EST AFRICAN AMERICAN: 81 mL/min/{1.73_m2} (ref >59)
GFR, EST NON AFRICAN AMERICAN: 70 mL/min/{1.73_m2} (ref >59)
GLUCOSE: 105 mg/dL — AB (ref 65–99)
Potassium: 4.3 mmol/L (ref 3.5–5.2)
SODIUM: 141 mmol/L (ref 134–144)

## 2016-03-22 LAB — URIC ACID: Uric Acid: 6.7 mg/dL (ref 3.7–8.6)

## 2016-04-28 DIAGNOSIS — K2270 Barrett's esophagus without dysplasia: Secondary | ICD-10-CM | POA: Diagnosis not present

## 2016-04-28 DIAGNOSIS — K219 Gastro-esophageal reflux disease without esophagitis: Secondary | ICD-10-CM | POA: Diagnosis not present

## 2016-05-30 ENCOUNTER — Ambulatory Visit (INDEPENDENT_AMBULATORY_CARE_PROVIDER_SITE_OTHER): Payer: Medicare Other | Admitting: Family Medicine

## 2016-05-30 ENCOUNTER — Encounter: Payer: Self-pay | Admitting: Family Medicine

## 2016-05-30 VITALS — BP 134/82 | HR 77 | Temp 97.6°F | Ht 68.3 in | Wt 243.0 lb

## 2016-05-30 DIAGNOSIS — N400 Enlarged prostate without lower urinary tract symptoms: Secondary | ICD-10-CM | POA: Diagnosis not present

## 2016-05-30 DIAGNOSIS — K22719 Barrett's esophagus with dysplasia, unspecified: Secondary | ICD-10-CM

## 2016-05-30 DIAGNOSIS — F419 Anxiety disorder, unspecified: Secondary | ICD-10-CM | POA: Diagnosis not present

## 2016-05-30 DIAGNOSIS — I10 Essential (primary) hypertension: Secondary | ICD-10-CM

## 2016-05-30 DIAGNOSIS — R5383 Other fatigue: Secondary | ICD-10-CM

## 2016-05-30 DIAGNOSIS — M10072 Idiopathic gout, left ankle and foot: Secondary | ICD-10-CM

## 2016-05-30 DIAGNOSIS — L918 Other hypertrophic disorders of the skin: Secondary | ICD-10-CM | POA: Diagnosis not present

## 2016-05-30 DIAGNOSIS — Q828 Other specified congenital malformations of skin: Secondary | ICD-10-CM | POA: Diagnosis not present

## 2016-05-30 DIAGNOSIS — Z0000 Encounter for general adult medical examination without abnormal findings: Secondary | ICD-10-CM | POA: Diagnosis not present

## 2016-05-30 LAB — URINALYSIS, ROUTINE W REFLEX MICROSCOPIC
Bilirubin, UA: NEGATIVE
GLUCOSE, UA: NEGATIVE
Ketones, UA: NEGATIVE
Leukocytes, UA: NEGATIVE
Nitrite, UA: NEGATIVE
PROTEIN UA: NEGATIVE
SPEC GRAV UA: 1.015 (ref 1.005–1.030)
UUROB: 0.2 mg/dL (ref 0.2–1.0)
pH, UA: 6 (ref 5.0–7.5)

## 2016-05-30 LAB — MICROSCOPIC EXAMINATION

## 2016-05-30 MED ORDER — ALLOPURINOL 300 MG PO TABS: 300.0000 mg | ORAL_TABLET | Freq: Every day | ORAL | Status: DC

## 2016-05-30 MED ORDER — METOPROLOL TARTRATE 25 MG PO TABS: 25.0000 mg | ORAL_TABLET | Freq: Two times a day (BID) | ORAL | Status: DC

## 2016-05-30 MED ORDER — LORAZEPAM 1 MG PO TABS: 0.5000 mg | ORAL_TABLET | Freq: Every day | ORAL | Status: DC | PRN

## 2016-05-30 MED ORDER — AMLODIPINE BESYLATE 10 MG PO TABS: 10.0000 mg | ORAL_TABLET | Freq: Every day | ORAL | Status: DC

## 2016-05-30 MED ORDER — COLCHICINE 0.6 MG PO CAPS: 0.6000 mg | ORAL_CAPSULE | Freq: Every day | ORAL | Status: DC

## 2016-05-30 MED ORDER — LOSARTAN POTASSIUM-HCTZ 100-25 MG PO TABS: 1.0000 | ORAL_TABLET | Freq: Every day | ORAL | Status: DC

## 2016-05-30 NOTE — Assessment & Plan Note (Signed)
Follow up EGD scheduled for routine monitoring.

## 2016-05-30 NOTE — Assessment & Plan Note (Signed)
The current medical regimen is effective;  continue present plan and medications.  

## 2016-05-30 NOTE — Progress Notes (Signed)
BP 134/82 mmHg  Pulse 77  Temp(Src) 97.6 F (36.4 C)  Ht 5' 8.3" (1.735 m)  Wt 243 lb (110.224 kg)  BMI 36.62 kg/m2  SpO2 99%   Subjective:    Patient ID: Jesse Norton, male    DOB: 1946/04/07, 70 y.o.   MRN: [id number]  HPI: DETAVION SEKEL is a 70 y.o. male  Chief Complaint  Patient presents with  . Annual Exam   HTN - Taking amlodipine, metoprolol, and losartan HCTZ faithfully. Home BPs running 110-130/80s. No dizzy spells or syncope  Gout - Doing well with allopurinol, controlled with no recent flares. Has colchicine on hand for flares.  Anxiety - Having excess home stress currently, needing refill on lorazepam to help with his nerves.   Barrett's Esophagus - EGD scheduled soon to monitor. Symptoms stable.   Joint Pain - pain in right wrist recently, noticed when working out and lifting more than 50 lb at the gym. Pain shoots up arm toward elbow.   Relevant past medical, surgical, family and social history reviewed and updated as indicated. Interim medical history since our last visit reviewed. Allergies and medications reviewed and updated.  Review of Systems  Constitutional: Negative.   HENT: Negative.   Eyes: Negative.   Respiratory: Negative.   Cardiovascular: Negative.   Gastrointestinal: Negative.   Endocrine: Negative.   Genitourinary: Negative.   Musculoskeletal: Positive for arthralgias.       Right wrist pain  Skin: Negative.   Allergic/Immunologic: Negative.   Neurological: Negative.   Hematological: Negative.   Psychiatric/Behavioral: Negative.     Per HPI unless specifically indicated above     Objective:    BP 134/82 mmHg  Pulse 77  Temp(Src) 97.6 F (36.4 C)  Ht 5' 8.3" (1.735 m)  Wt 243 lb (110.224 kg)  BMI 36.62 kg/m2  SpO2 99%  Wt Readings from Last 3 Encounters:  05/30/16 243 lb (110.224 kg)  03/21/16 247 lb (112.038 kg)  02/14/16 247 lb (112.038 kg)    Physical Exam  Constitutional: He is oriented to person, place,  and time. He appears well-developed and well-nourished.  HENT:  Head: Normocephalic and atraumatic.  Right Ear: External ear normal.  Left Ear: External ear normal.  Eyes: Conjunctivae and EOM are normal. Pupils are equal, round, and reactive to light.  Neck: Normal range of motion. Neck supple.  Cardiovascular: Normal rate, regular rhythm, normal heart sounds and intact distal pulses.   Pulmonary/Chest: Effort normal and breath sounds normal.  Abdominal: Soft. Bowel sounds are normal. There is no splenomegaly or hepatomegaly.  Genitourinary: Rectum normal, prostate normal and penis normal.  Musculoskeletal: Normal range of motion.  TTP on right wrist.   Neurological: He is alert and oriented to person, place, and time. He has normal reflexes.  Skin: No rash noted. No erythema.  Skin tags present in right axilla  Psychiatric: He has a normal mood and affect. His behavior is normal. Judgment and thought content normal.    Results for orders placed or performed in visit on 03/21/16  Uric acid  Result Value Ref Range   Uric Acid 6.7 3.7 - 8.6 mg/dL  Basic metabolic panel  Result Value Ref Range   Glucose 105 (H) 65 - 99 mg/dL   BUN 21 8 - 27 mg/dL   Creatinine, Ser 1.06 0.76 - 1.27 mg/dL   GFR calc non Af Amer 70 >59 mL/min/1.73   GFR calc Af Amer 81 >59 mL/min/1.73   BUN/Creatinine Ratio 20  10 - 24   Sodium 141 134 - 144 mmol/L   Potassium 4.3 3.5 - 5.2 mmol/L   Chloride 99 96 - 106 mmol/L   CO2 27 18 - 29 mmol/L   Calcium 9.6 8.6 - 10.2 mg/dL      Assessment & Plan:   Problem List Items Addressed This Visit      Cardiovascular and Mediastinum   Essential hypertension    The current medical regimen is effective;  continue present plan and medications.      Relevant Medications   amLODipine (NORVASC) 10 MG tablet   losartan-hydrochlorothiazide (HYZAAR) 100-25 MG tablet   metoprolol tartrate (LOPRESSOR) 25 MG tablet     Digestive   Barrett's esophagus    Follow up  EGD scheduled for routine monitoring.        Other   Acute anxiety    The current medical regimen is effective;  continue present plan and medications. Will refill today.       Relevant Medications   LORazepam (ATIVAN) 1 MG tablet   Gout    The current medical regimen is effective;  continue present plan and medications.        Other Visit Diagnoses    Routine general medical examination at a health care facility    -  Primary    Relevant Orders    CBC with Differential/Platelet    Comprehensive metabolic panel    Lipid Panel w/o Chol/HDL Ratio    PSA    TSH    Urinalysis, Routine w reflex microscopic (not at [facility])    Other fatigue        Relevant Orders    CBC with Differential/Platelet    TSH    Enlarged prostate        Relevant Orders    PSA    Accessory skin tags        Skin tags left and right axilla destroyed with cryo-unit. These are irritated skin tags that occasionally scratching bleed.        Follow up plan: Return in about 6 months (around 11/29/2016) for bmp.

## 2016-05-30 NOTE — Assessment & Plan Note (Signed)
The current medical regimen is effective;  continue present plan and medications. Will refill today.

## 2016-05-31 ENCOUNTER — Encounter: Payer: Self-pay | Admitting: Family Medicine

## 2016-05-31 LAB — COMPREHENSIVE METABOLIC PANEL
ALBUMIN: 4.5 g/dL (ref 3.5–4.8)
ALK PHOS: 48 IU/L (ref 39–117)
ALT: 21 IU/L (ref 0–44)
AST: 25 IU/L (ref 0–40)
Albumin/Globulin Ratio: 2 (ref 1.2–2.2)
BILIRUBIN TOTAL: 0.6 mg/dL (ref 0.0–1.2)
BUN/Creatinine Ratio: 21 (ref 10–24)
BUN: 23 mg/dL (ref 8–27)
CHLORIDE: 97 mmol/L (ref 96–106)
CO2: 27 mmol/L (ref 18–29)
CREATININE: 1.08 mg/dL (ref 0.76–1.27)
Calcium: 9.8 mg/dL (ref 8.6–10.2)
GFR calc Af Amer: 79 mL/min/{1.73_m2} (ref >59)
GFR calc non Af Amer: 69 mL/min/{1.73_m2} (ref >59)
GLUCOSE: 117 mg/dL — AB (ref 65–99)
Globulin, Total: 2.2 g/dL (ref 1.5–4.5)
Potassium: 4.1 mmol/L (ref 3.5–5.2)
Sodium: 140 mmol/L (ref 134–144)
Total Protein: 6.7 g/dL (ref 6.0–8.5)

## 2016-05-31 LAB — LIPID PANEL W/O CHOL/HDL RATIO
CHOLESTEROL TOTAL: 181 mg/dL (ref 100–199)
HDL: 62 mg/dL (ref >39)
LDL CALC: 107 mg/dL — AB (ref 0–99)
TRIGLYCERIDES: 61 mg/dL (ref 0–149)
VLDL CHOLESTEROL CAL: 12 mg/dL (ref 5–40)

## 2016-05-31 LAB — CBC WITH DIFFERENTIAL/PLATELET
BASOS ABS: 0.1 10*3/uL (ref 0.0–0.2)
Basos: 2 %
EOS (ABSOLUTE): 0.2 10*3/uL (ref 0.0–0.4)
Eos: 3 %
HEMOGLOBIN: 14 g/dL (ref 12.6–17.7)
Hematocrit: 39.5 % (ref 37.5–51.0)
Immature Grans (Abs): 0 10*3/uL (ref 0.0–0.1)
Immature Granulocytes: 0 %
LYMPHS ABS: 2.4 10*3/uL (ref 0.7–3.1)
Lymphs: 47 %
MCH: 33.1 pg — AB (ref 26.6–33.0)
MCHC: 35.4 g/dL (ref 31.5–35.7)
MCV: 93 fL (ref 79–97)
MONOCYTES: 14 %
Monocytes Absolute: 0.7 10*3/uL (ref 0.1–0.9)
NEUTROS ABS: 1.7 10*3/uL (ref 1.4–7.0)
Neutrophils: 34 %
PLATELETS: 278 10*3/uL (ref 150–379)
RBC: 4.23 x10E6/uL (ref 4.14–5.80)
RDW: 13.8 % (ref 12.3–15.4)
WBC: 5.1 10*3/uL (ref 3.4–10.8)

## 2016-05-31 LAB — URIC ACID: URIC ACID: 6 mg/dL (ref 3.7–8.6)

## 2016-05-31 LAB — PSA: Prostate Specific Ag, Serum: 1.9 ng/mL (ref 0.0–4.0)

## 2016-05-31 LAB — TSH: TSH: 1.46 u[IU]/mL (ref 0.450–4.500)

## 2016-06-01 ENCOUNTER — Other Ambulatory Visit: Payer: Self-pay | Admitting: Family Medicine

## 2016-07-07 ENCOUNTER — Encounter: Payer: Self-pay | Admitting: *Deleted

## 2016-07-10 ENCOUNTER — Encounter
Admission: RE | Disposition: A | Discharge status: Home Health | Payer: Self-pay | Source: Ambulatory Visit | Attending: Gastroenterology

## 2016-07-10 ENCOUNTER — Encounter: Payer: Self-pay | Admitting: *Deleted

## 2016-07-10 ENCOUNTER — Ambulatory Visit
Admission: RE | Admit: 2016-07-10 | Discharge: 2016-07-10 | Disposition: A | Discharge status: Home Health | Payer: Medicare Other | Source: Ambulatory Visit | Attending: Gastroenterology | Admitting: Gastroenterology

## 2016-07-10 ENCOUNTER — Ambulatory Visit: Payer: Medicare Other | Admitting: Anesthesiology

## 2016-07-10 DIAGNOSIS — K2270 Barrett's esophagus without dysplasia: Secondary | ICD-10-CM | POA: Diagnosis not present

## 2016-07-10 DIAGNOSIS — K2970 Gastritis, unspecified, without bleeding: Secondary | ICD-10-CM | POA: Diagnosis not present

## 2016-07-10 DIAGNOSIS — Z8711 Personal history of peptic ulcer disease: Secondary | ICD-10-CM | POA: Insufficient documentation

## 2016-07-10 DIAGNOSIS — K449 Diaphragmatic hernia without obstruction or gangrene: Secondary | ICD-10-CM | POA: Insufficient documentation

## 2016-07-10 DIAGNOSIS — Z8719 Personal history of other diseases of the digestive system: Secondary | ICD-10-CM | POA: Diagnosis not present

## 2016-07-10 DIAGNOSIS — K2960 Other gastritis without bleeding: Secondary | ICD-10-CM | POA: Diagnosis not present

## 2016-07-10 DIAGNOSIS — Z79899 Other long term (current) drug therapy: Secondary | ICD-10-CM | POA: Diagnosis not present

## 2016-07-10 DIAGNOSIS — K3189 Other diseases of stomach and duodenum: Secondary | ICD-10-CM | POA: Diagnosis not present

## 2016-07-10 DIAGNOSIS — K219 Gastro-esophageal reflux disease without esophagitis: Secondary | ICD-10-CM | POA: Insufficient documentation

## 2016-07-10 DIAGNOSIS — K317 Polyp of stomach and duodenum: Secondary | ICD-10-CM | POA: Diagnosis not present

## 2016-07-10 DIAGNOSIS — I10 Essential (primary) hypertension: Secondary | ICD-10-CM | POA: Diagnosis not present

## 2016-07-10 DIAGNOSIS — D132 Benign neoplasm of duodenum: Secondary | ICD-10-CM | POA: Insufficient documentation

## 2016-07-10 DIAGNOSIS — F419 Anxiety disorder, unspecified: Secondary | ICD-10-CM | POA: Diagnosis not present

## 2016-07-10 DIAGNOSIS — K228 Other specified diseases of esophagus: Secondary | ICD-10-CM | POA: Diagnosis not present

## 2016-07-10 HISTORY — DX: Anxiety disorder, unspecified: F41.9

## 2016-07-10 HISTORY — DX: Gastric ulcer, unspecified as acute or chronic, without hemorrhage or perforation: K25.9

## 2016-07-10 HISTORY — PX: ESOPHAGOGASTRODUODENOSCOPY (EGD) WITH PROPOFOL: SHX5813

## 2016-07-10 SURGERY — ESOPHAGOGASTRODUODENOSCOPY (EGD) WITH PROPOFOL
Anesthesia: General

## 2016-07-10 MED ORDER — SODIUM CHLORIDE 0.9 % IV SOLN
INTRAVENOUS | Status: DC
  Administered 2016-07-10: 13:00:00 via INTRAVENOUS

## 2016-07-10 MED ORDER — MIDAZOLAM HCL 5 MG/5ML IJ SOLN
INTRAMUSCULAR | Status: DC | PRN
  Administered 2016-07-10: 1 mg via INTRAVENOUS

## 2016-07-10 MED ORDER — SODIUM CHLORIDE 0.9 % IV SOLN: INTRAVENOUS | Status: DC

## 2016-07-10 MED ORDER — PROPOFOL 500 MG/50ML IV EMUL
INTRAVENOUS | Status: DC | PRN
  Administered 2016-07-10: 120 ug/kg/min via INTRAVENOUS

## 2016-07-10 MED ORDER — PROPOFOL 10 MG/ML IV BOLUS
INTRAVENOUS | Status: DC | PRN
  Administered 2016-07-10: 70 mg via INTRAVENOUS
  Administered 2016-07-10: 30 mg via INTRAVENOUS

## 2016-07-10 MED ORDER — GLYCOPYRROLATE 0.2 MG/ML IJ SOLN
INTRAMUSCULAR | Status: DC | PRN
Start: 2016-07-10 — End: 2016-07-10
  Administered 2016-07-10: 0.2 mg via INTRAVENOUS

## 2016-07-10 MED ORDER — LIDOCAINE 2% (20 MG/ML) 5 ML SYRINGE
INTRAMUSCULAR | Status: DC | PRN
  Administered 2016-07-10: 50 mg via INTRAVENOUS

## 2016-07-10 NOTE — H&P (Signed)
Outpatient short stay form Pre-procedure 07/10/2016 1:04 PM Jesse Sails MD  Primary Physician: Dr. Kelli Churn  Reason for visit:  EGD  History of present illness:  Patient is a 70 year old male presenting today for EGD. He has a personal history of Barrett's esophagus. His last procedure was 04/09/2013. At that time he did show Barrett's esophagus negative for dysplasia. He is currently taking over-the-counter Nexium daily. He denies any dysphagia. Heartburn symptoms are well controlled.    Current Facility-Administered Medications:  .  0.9 %  sodium chloride infusion, , Intravenous, Continuous, Manya Silvas, MD, Last Rate: 20 mL/hr at 07/10/16 1233 .  0.9 %  sodium chloride infusion, , Intravenous, Continuous, Jesse Sails, MD  Prescriptions Prior to Admission  Medication Sig Dispense Refill Last Dose  . allopurinol (ZYLOPRIM) 300 MG tablet Take 1 tablet (300 mg total) by mouth daily. 90 tablet 4 07/09/2016 at Unknown time  . amLODipine (NORVASC) 10 MG tablet Take 1 tablet (10 mg total) by mouth daily. 90 tablet 4 07/10/2016 at 0600  . LORazepam (ATIVAN) 1 MG tablet Take 0.5-1 tablets (0.5-1 mg total) by mouth daily as needed for anxiety. 30 tablet 1 Past Month at Unknown time  . losartan-hydrochlorothiazide (HYZAAR) 100-25 MG tablet Take 1 tablet by mouth daily. 90 tablet 4 07/10/2016 at 0600  . metoprolol tartrate (LOPRESSOR) 25 MG tablet Take 1 tablet (25 mg total) by mouth 2 (two) times daily. 90 tablet 4 07/10/2016 at 0600  . Multiple Vitamin (MULTIVITAMIN) tablet Take 1 tablet by mouth daily.   Past Week at Unknown time  . aspirin EC 81 MG tablet Take 81 mg by mouth daily.   07/06/2016  . celecoxib (CELEBREX) 200 MG capsule Take 1 capsule (200 mg total) by mouth daily. 90 capsule 4 07/08/2016  . Colchicine (MITIGARE) 0.6 MG CAPS Take 0.6 mg by mouth daily. Take 2 now then 1 a day 30 capsule 4 07/08/2016  . esomeprazole (NEXIUM) 40 MG capsule Take 40 mg by mouth daily at  12 noon.   Taking  . metoprolol tartrate (LOPRESSOR) 25 MG tablet TAKE 1 TABLET(25 MG) BY MOUTH TWICE DAILY 90 tablet 1      No Known Allergies   Past Medical History:  Diagnosis Date  . Anxiety   . Barrett's esophagus   . GERD (gastroesophageal reflux disease)   . Hypertension   . Stomach ulcer     Review of systems:      Physical Exam    Heart and lungs: Regular rate and rhythm without rub or gallop, lungs are bilaterally clear.    HEENT: Normocephalic atraumatic eyes are anicteric    Other: Digital    Pertinant exam for procedure: Soft nontender nondistended bowel sounds positive normoactive.    Planned proceedures:  I have discussed the risks benefits and complications of procedures to include not limited to bleeding, infection, perforation and the risk of sedation and the patient wishes to proceed.    Jesse Sails, MD Gastroenterology 07/10/2016  1:04 PM

## 2016-07-10 NOTE — Anesthesia Preprocedure Evaluation (Signed)
Anesthesia Evaluation  Patient identified by MRN, date of birth, ID band Patient awake    Reviewed: Allergy & Precautions, NPO status , Patient's Chart, lab work & pertinent test results  Airway Mallampati: II       Dental  (+) Teeth Intact, Implants   Pulmonary neg pulmonary ROS,  Sleep Apnea?   breath sounds clear to auscultation       Cardiovascular Exercise Tolerance: Good hypertension, Pt. on medications and Pt. on home beta blockers  Rhythm:Regular     Neuro/Psych Anxiety negative neurological ROS     GI/Hepatic Neg liver ROS, PUD, GERD  Medicated,  Endo/Other  Morbid obesity  Renal/GU negative Renal ROS     Musculoskeletal negative musculoskeletal ROS (+)   Abdominal (+) + obese,   Peds  Hematology negative hematology ROS (+)   Anesthesia Other Findings   Reproductive/Obstetrics                             Anesthesia Physical Anesthesia Plan  ASA: III  Anesthesia Plan: General   Post-op Pain Management:    Induction: Intravenous  Airway Management Planned: Natural Airway and Nasal Cannula  Additional Equipment:   Intra-op Plan:   Post-operative Plan:   Informed Consent: I have reviewed the patients History and Physical, chart, labs and discussed the procedure including the risks, benefits and alternatives for the proposed anesthesia with the patient or authorized representative who has indicated his/her understanding and acceptance.     Plan Discussed with: CRNA  Anesthesia Plan Comments:         Anesthesia Quick Evaluation

## 2016-07-10 NOTE — Op Note (Signed)
[facility] Gastroenterology Patient Name: Jesse Norton Procedure Date: 07/10/2016 1:20 PM MRN: [id number] Account #: [id number] Date of Birth: December 19, 1945 Admit Type: Outpatient Age: 70 Room: [facility] ENDO ROOM 1 Gender: Male Note Status: Finalized Procedure:            Upper GI endoscopy Indications:          Follow-up of Barrett's esophagus Providers:            Lollie Sails, MD Referring MD:         [name], MD (Referring MD) Medicines:            Monitored Anesthesia Care Complications:        No immediate complications. Procedure:            Pre-Anesthesia Assessment:                       - ASA Grade Assessment: III - A patient with severe                        systemic disease.                       After obtaining informed consent, the endoscope was                        passed under direct vision. Throughout the procedure,                        the patient's blood pressure, pulse, and oxygen                        saturations were monitored continuously. The Endoscope                        was introduced through the mouth, and advanced to the                        third part of duodenum. The upper GI endoscopy was                        accomplished without difficulty. The patient tolerated                        the procedure well. Findings:      There were esophageal mucosal changes consistent with short-segment       Barrett's esophagus present in the lower third of the esophagus. The       maximum longitudinal extent of these mucosal changes was 2 cm in length.       Mucosa was biopsied with a cold forceps for histology in 4 quadrants at       38 and 40 cm from the incisors.      A small hiatal hernia was found. The Z-line was a variable distance from       incisors; the hiatal hernia was sliding.      Patchy mild inflammation characterized by congestion (edema) and       erythema was found in the gastric antrum. Biopsies were  taken with a       cold forceps for histology. Biopsies were taken with a cold forceps for  Helicobacter pylori testing.      The cardia and gastric fundus were normal on retroflexion otherwise.      A single 1 mm sessile polyp with no bleeding was found in the third       portion of the duodenum. The polyp was removed with a cold biopsy       forceps. Resection and retrieval were complete. Impression:           - Esophageal mucosal changes consistent with                        short-segment Barrett's esophagus. Biopsied.                       - Small hiatal hernia.                       - Gastritis. Biopsied.                       - A single duodenal polyp. Resected and retrieved. Recommendation:       - Await pathology results.                       - Continue present medications.                       - Telephone GI clinic for pathology results in 1 week. Procedure Code(s):    --- Professional ---                       [phone], Esophagogastroduodenoscopy, flexible, transoral;                        with biopsy, single or multiple Diagnosis Code(s):    --- Professional ---                       K22.70, Barrett's esophagus without dysplasia                       K44.9, Diaphragmatic hernia without obstruction or                        gangrene                       K29.70, Gastritis, unspecified, without bleeding                       K31.7, Polyp of stomach and duodenum CPT copyright 2016 [employer]. All rights reserved. The codes documented in this report are preliminary and upon coder review may  be revised to meet current compliance requirements. Lollie Sails, MD 07/10/2016 1:42:05 PM This report has been signed electronically. Number of Addenda: 0 Note Initiated On: 07/10/2016 1:20 PM      [facility]

## 2016-07-10 NOTE — Transfer of Care (Signed)
Immediate Anesthesia Transfer of Care Note  Patient: Jesse Norton  Procedure(s) Performed: Procedure(s): ESOPHAGOGASTRODUODENOSCOPY (EGD) WITH PROPOFOL (N/A)  Patient Location: Endoscopy Unit  Anesthesia Type:General  Level of Consciousness: awake  Airway & Oxygen Therapy: Patient Spontanous Breathing and Patient connected to nasal cannula oxygen  Post-op Assessment: Report given to RN and Post -op Vital signs reviewed and stable  Post vital signs: Reviewed  Last Vitals:  Vitals:   07/10/16 1341 07/10/16 1342  BP:  (!) 153/102  Pulse:  69  Resp:  18  Temp: (P) 36.2 C 36.2 C    Last Pain:  Vitals:   07/10/16 1341  TempSrc: (P) Tympanic         Complications: No apparent anesthesia complications

## 2016-07-10 NOTE — Anesthesia Postprocedure Evaluation (Signed)
Anesthesia Post Note  Patient: Jesse Norton  Procedure(s) Performed: Procedure(s) (LRB): ESOPHAGOGASTRODUODENOSCOPY (EGD) WITH PROPOFOL (N/A)  Patient location during evaluation: PACU Anesthesia Type: General Level of consciousness: awake Pain management: satisfactory to patient Vital Signs Assessment: post-procedure vital signs reviewed and stable Respiratory status: spontaneous breathing Cardiovascular status: stable Anesthetic complications: no    Last Vitals:  Vitals:   07/10/16 1342 07/10/16 1352  BP: (!) 153/102 125/81  Pulse: 69 72  Resp: 18 15  Temp: 36.2 C     Last Pain:  Vitals:   07/10/16 1341  TempSrc: Tympanic                 VAN STAVEREN,Knox Holdman

## 2016-07-11 ENCOUNTER — Encounter: Payer: Self-pay | Admitting: Gastroenterology

## 2016-07-12 LAB — SURGICAL PATHOLOGY

## 2016-08-10 DIAGNOSIS — K219 Gastro-esophageal reflux disease without esophagitis: Secondary | ICD-10-CM | POA: Diagnosis not present

## 2016-08-10 DIAGNOSIS — K2270 Barrett's esophagus without dysplasia: Secondary | ICD-10-CM | POA: Diagnosis not present

## 2016-11-29 ENCOUNTER — Encounter: Payer: Self-pay | Admitting: Family Medicine

## 2016-11-29 ENCOUNTER — Ambulatory Visit (INDEPENDENT_AMBULATORY_CARE_PROVIDER_SITE_OTHER): Payer: Medicare Other | Admitting: Family Medicine

## 2016-11-29 VITALS — BP 121/78 | HR 77 | Temp 97.6°F | Ht 68.5 in | Wt 244.0 lb

## 2016-11-29 DIAGNOSIS — F419 Anxiety disorder, unspecified: Secondary | ICD-10-CM | POA: Diagnosis not present

## 2016-11-29 DIAGNOSIS — K22719 Barrett's esophagus with dysplasia, unspecified: Secondary | ICD-10-CM | POA: Diagnosis not present

## 2016-11-29 DIAGNOSIS — Z23 Encounter for immunization: Secondary | ICD-10-CM

## 2016-11-29 DIAGNOSIS — I10 Essential (primary) hypertension: Secondary | ICD-10-CM

## 2016-11-29 DIAGNOSIS — M10072 Idiopathic gout, left ankle and foot: Secondary | ICD-10-CM

## 2016-11-29 MED ORDER — PANTOPRAZOLE SODIUM 40 MG PO TBEC: 40.0000 mg | DELAYED_RELEASE_TABLET | Freq: Every day | ORAL | 2 refills | Status: DC

## 2016-11-29 MED ORDER — LORAZEPAM 1 MG PO TABS: 0.5000 mg | ORAL_TABLET | Freq: Every day | ORAL | 1 refills | Status: DC | PRN

## 2016-11-29 MED ORDER — METOPROLOL TARTRATE 25 MG PO TABS: 25.0000 mg | ORAL_TABLET | Freq: Two times a day (BID) | ORAL | 2 refills | Status: DC

## 2016-11-29 NOTE — Assessment & Plan Note (Signed)
Continue lorazepam limited use

## 2016-11-29 NOTE — Assessment & Plan Note (Signed)
The current medical regimen is effective;  continue present plan and medications.  

## 2016-11-29 NOTE — Assessment & Plan Note (Signed)
Patient is changed from Nexium to Protonix 40 mg and is doing much better wants to stay on Protonix will give prescription.

## 2016-11-29 NOTE — Assessment & Plan Note (Signed)
Discuss gout we'll check uric acid today and reassess medications

## 2016-11-29 NOTE — Progress Notes (Signed)
BP 121/78 (BP Location: Right Arm, Patient Position: Sitting, Cuff Size: Normal)   Pulse 77   Temp 97.6 F (36.4 C) (Oral)   Ht 5' 8.5" (1.74 m)   Wt 244 lb (110.7 kg)   SpO2 94%   BMI 36.56 kg/m    Subjective:    Patient ID: Jesse Norton, male    DOB: October 01, 1946, 70 y.o.   MRN: [id number]  HPI: Jesse Norton is a 70 y.o. male  Chief Complaint  Patient presents with  . Hypertension  . Follow-up   Patient follow-up doing well with blood pressure no issues with medications taken faithfully without side effects. For reflux doing well with change over to Protonix wants to continue. Anxiety taking lorazepam occasionally for stormy days and does okay. Has an anticipation of more stormy days to calm and maybe a lot of rainy days will consider at that point whether we need to go to a daily medication. For gout has had some occasional flare which is resolved with colchicine  Relevant past medical, surgical, family and social history reviewed and updated as indicated. Interim medical history since our last visit reviewed. Allergies and medications reviewed and updated.  Review of Systems  Constitutional: Negative.   Respiratory: Negative.   Cardiovascular: Negative.     Per HPI unless specifically indicated above     Objective:    BP 121/78 (BP Location: Right Arm, Patient Position: Sitting, Cuff Size: Normal)   Pulse 77   Temp 97.6 F (36.4 C) (Oral)   Ht 5' 8.5" (1.74 m)   Wt 244 lb (110.7 kg)   SpO2 94%   BMI 36.56 kg/m   Wt Readings from Last 3 Encounters:  11/29/16 244 lb (110.7 kg)  07/10/16 243 lb (110.2 kg)  05/30/16 243 lb (110.2 kg)    Physical Exam  Constitutional: He is oriented to person, place, and time. He appears well-developed and well-nourished. No distress.  HENT:  Head: Normocephalic and atraumatic.  Right Ear: Hearing normal.  Left Ear: Hearing normal.  Nose: Nose normal.  Eyes: Conjunctivae and lids are normal. Right eye exhibits no  discharge. Left eye exhibits no discharge. No scleral icterus.  Cardiovascular: Normal rate, regular rhythm and normal heart sounds.   Pulmonary/Chest: Effort normal and breath sounds normal. No respiratory distress.  Musculoskeletal: Normal range of motion.  Neurological: He is alert and oriented to person, place, and time.  Skin: Skin is intact. No rash noted.  Psychiatric: He has a normal mood and affect. His speech is normal and behavior is normal. Judgment and thought content normal. Cognition and memory are normal.        Assessment & Plan:   Problem List Items Addressed This Visit      Cardiovascular and Mediastinum   Essential hypertension - Primary    The current medical regimen is effective;  continue present plan and medications.       Relevant Medications   metoprolol tartrate (LOPRESSOR) 25 MG tablet   Other Relevant Orders   Basic metabolic panel     Digestive   Barrett's esophagus    Patient is changed from Nexium to Protonix 40 mg and is doing much better wants to stay on Protonix will give prescription.      Relevant Medications   pantoprazole (PROTONIX) 40 MG tablet     Other   Acute anxiety    Continue lorazepam limited use      Relevant Medications   LORazepam (ATIVAN) 1 MG  tablet   Gout    Discuss gout we'll check uric acid today and reassess medications      Relevant Orders   Uric acid    Other Visit Diagnoses    Need for influenza vaccination       Relevant Orders   Flu vaccine HIGH DOSE PF (Completed)       Follow up plan: Return in about 6 months (around 05/30/2017) for Physical Exam.

## 2016-11-30 ENCOUNTER — Encounter: Payer: Self-pay | Admitting: Family Medicine

## 2016-11-30 ENCOUNTER — Other Ambulatory Visit: Payer: Self-pay | Admitting: Family Medicine

## 2016-11-30 LAB — BASIC METABOLIC PANEL
BUN / CREAT RATIO: 23 (ref 10–24)
BUN: 23 mg/dL (ref 8–27)
CALCIUM: 9.9 mg/dL (ref 8.6–10.2)
CHLORIDE: 97 mmol/L (ref 96–106)
CO2: 26 mmol/L (ref 18–29)
Creatinine, Ser: 1.01 mg/dL (ref 0.76–1.27)
GFR, EST AFRICAN AMERICAN: 86 mL/min/{1.73_m2} (ref >59)
GFR, EST NON AFRICAN AMERICAN: 74 mL/min/{1.73_m2} (ref >59)
Glucose: 114 mg/dL — ABNORMAL HIGH (ref 65–99)
Potassium: 4.3 mmol/L (ref 3.5–5.2)
Sodium: 140 mmol/L (ref 134–144)

## 2016-11-30 LAB — URIC ACID: Uric Acid: 4.3 mg/dL (ref 3.7–8.6)

## 2016-11-30 NOTE — Telephone Encounter (Signed)
Routing to provider.   Patient was seen yesterday.

## 2016-11-30 NOTE — Telephone Encounter (Signed)
Pt came in stated his RX for Losartan was not sent to the pharmacy. Pt needs refill on Losartan. [facility] [place]. Thanks.

## 2017-01-09 ENCOUNTER — Ambulatory Visit (INDEPENDENT_AMBULATORY_CARE_PROVIDER_SITE_OTHER): Payer: Medicare Other | Admitting: Family Medicine

## 2017-01-09 ENCOUNTER — Encounter: Payer: Self-pay | Admitting: Family Medicine

## 2017-01-09 DIAGNOSIS — M10072 Idiopathic gout, left ankle and foot: Secondary | ICD-10-CM | POA: Diagnosis not present

## 2017-01-09 DIAGNOSIS — I10 Essential (primary) hypertension: Secondary | ICD-10-CM

## 2017-01-09 NOTE — Progress Notes (Signed)
BP 138/86 (BP Location: Left Arm)   Pulse 86   Temp 98.6 F (37 C) (Oral)   Ht 5' 8.5" (1.74 m)   Wt 240 lb (108.9 kg)   SpO2 98%   BMI 35.96 kg/m    Subjective:    Patient ID: Jesse Norton, male    DOB: [birth year], 70 y.o.   MRN: [id number]  HPI: Jesse Norton is a 71 y.o. male  Chief Complaint  Patient presents with  . Follow-up  . Hypertension    Started this weekend.  187/95, no symptoms.  Started Supercallogen   Patient's been checking blood pressure approximately once a week and has consistently good blood pressure readings 120s over 70s. Has been taking medication without fail or change. Has no side effects. Patient started taking super collagen supplement which on review has a lot of of collagen in it. Also some salt. This is the only change the patient is made.  Relevant past medical, surgical, family and social history reviewed and updated as indicated. Interim medical history since our last visit reviewed. Allergies and medications reviewed and updated.  Review of Systems  Constitutional: Negative.   Respiratory: Negative.   Cardiovascular: Negative.     Per HPI unless specifically indicated above     Objective:    BP 138/86 (BP Location: Left Arm)   Pulse 86   Temp 98.6 F (37 C) (Oral)   Ht 5' 8.5" (1.74 m)   Wt 240 lb (108.9 kg)   SpO2 98%   BMI 35.96 kg/m   Wt Readings from Last 3 Encounters:  01/09/17 240 lb (108.9 kg)  11/29/16 244 lb (110.7 kg)  07/10/16 243 lb (110.2 kg)    Physical Exam  Constitutional: He is oriented to person, place, and time. He appears well-developed and well-nourished. No distress.  HENT:  Head: Normocephalic and atraumatic.  Right Ear: Hearing normal.  Left Ear: Hearing normal.  Nose: Nose normal.  Eyes: Conjunctivae and lids are normal. Right eye exhibits no discharge. Left eye exhibits no discharge. No scleral icterus.  Cardiovascular: Normal rate, regular rhythm and normal heart sounds.     Pulmonary/Chest: Effort normal and breath sounds normal. No respiratory distress.  Musculoskeletal: Normal range of motion.  Neurological: He is alert and oriented to person, place, and time.  Skin: Skin is intact. No rash noted.  Psychiatric: He has a normal mood and affect. His speech is normal and behavior is normal. Judgment and thought content normal. Cognition and memory are normal.    Results for orders placed or performed in visit on [id number]  Basic metabolic panel  Result Value Ref Range   Glucose 114 (H) 65 - 99 mg/dL   BUN 23 8 - 27 mg/dL   Creatinine, Ser 1.01 0.76 - 1.27 mg/dL   GFR calc non Af Amer 74 >59 mL/min/1.73   GFR calc Af Amer 86 >59 mL/min/1.73   BUN/Creatinine Ratio 23 10 - 24   Sodium 140 134 - 144 mmol/L   Potassium 4.3 3.5 - 5.2 mmol/L   Chloride 97 96 - 106 mmol/L   CO2 26 18 - 29 mmol/L   Calcium 9.9 8.6 - 10.2 mg/dL  Uric acid  Result Value Ref Range   Uric Acid 4.3 3.7 - 8.6 mg/dL      Assessment & Plan:   Problem List Items Addressed This Visit      Cardiovascular and Mediastinum   Essential hypertension    Discuss elevated blood pressure readings  and change with starting to take super collagen. Patient will discontinue supplement observe blood pressure control. If blood pressure remains elevated will reassess and call.        Other   Gout    Gout symptoms has resolved still has small lump in area of her gout was. But no further gout symptoms. Discussed Will continue allopurinol long-term.          Follow up plan: Return in about 6 months (around 07/09/2017) for Physical Exam.

## 2017-01-09 NOTE — Assessment & Plan Note (Signed)
Gout symptoms has resolved still has small lump in area of her gout was. But no further gout symptoms. Discussed Will continue allopurinol long-term.

## 2017-01-09 NOTE — Assessment & Plan Note (Signed)
Discuss elevated blood pressure readings and change with starting to take super collagen. Patient will discontinue supplement observe blood pressure control. If blood pressure remains elevated will reassess and call.

## 2017-01-26 ENCOUNTER — Encounter: Payer: Self-pay | Admitting: *Deleted

## 2017-01-29 ENCOUNTER — Ambulatory Visit: Payer: Medicare Other | Admitting: Anesthesiology

## 2017-01-29 ENCOUNTER — Ambulatory Visit
Admission: RE | Admit: 2017-01-29 | Discharge: 2017-01-29 | Disposition: A | Discharge status: Home / Self Care | Payer: Medicare Other | Source: Ambulatory Visit | Attending: Gastroenterology | Admitting: Gastroenterology

## 2017-01-29 ENCOUNTER — Encounter: Payer: Self-pay | Admitting: *Deleted

## 2017-01-29 ENCOUNTER — Encounter
Admission: RE | Disposition: A | Discharge status: Home / Self Care | Payer: Self-pay | Source: Ambulatory Visit | Attending: Gastroenterology

## 2017-01-29 DIAGNOSIS — Z79899 Other long term (current) drug therapy: Secondary | ICD-10-CM | POA: Insufficient documentation

## 2017-01-29 DIAGNOSIS — K2900 Acute gastritis without bleeding: Secondary | ICD-10-CM | POA: Diagnosis not present

## 2017-01-29 DIAGNOSIS — K2960 Other gastritis without bleeding: Secondary | ICD-10-CM | POA: Diagnosis not present

## 2017-01-29 DIAGNOSIS — K219 Gastro-esophageal reflux disease without esophagitis: Secondary | ICD-10-CM | POA: Insufficient documentation

## 2017-01-29 DIAGNOSIS — I10 Essential (primary) hypertension: Secondary | ICD-10-CM | POA: Insufficient documentation

## 2017-01-29 DIAGNOSIS — Z7982 Long term (current) use of aspirin: Secondary | ICD-10-CM | POA: Diagnosis not present

## 2017-01-29 DIAGNOSIS — K2270 Barrett's esophagus without dysplasia: Secondary | ICD-10-CM | POA: Diagnosis not present

## 2017-01-29 DIAGNOSIS — K259 Gastric ulcer, unspecified as acute or chronic, without hemorrhage or perforation: Secondary | ICD-10-CM | POA: Diagnosis not present

## 2017-01-29 DIAGNOSIS — F419 Anxiety disorder, unspecified: Secondary | ICD-10-CM | POA: Diagnosis not present

## 2017-01-29 DIAGNOSIS — K317 Polyp of stomach and duodenum: Secondary | ICD-10-CM | POA: Diagnosis not present

## 2017-01-29 DIAGNOSIS — K2970 Gastritis, unspecified, without bleeding: Secondary | ICD-10-CM | POA: Diagnosis not present

## 2017-01-29 HISTORY — PX: ESOPHAGOGASTRODUODENOSCOPY: SHX5428

## 2017-01-29 SURGERY — EGD (ESOPHAGOGASTRODUODENOSCOPY)
Anesthesia: General

## 2017-01-29 MED ORDER — SODIUM CHLORIDE 0.9 % IV SOLN: INTRAVENOUS | Status: DC

## 2017-01-29 MED ORDER — SODIUM CHLORIDE 0.9 % IV SOLN
INTRAVENOUS | Status: DC | PRN
  Administered 2017-01-29: 100 ug via INTRAVENOUS

## 2017-01-29 MED ORDER — PROPOFOL 10 MG/ML IV BOLUS
INTRAVENOUS | Status: DC | PRN
  Administered 2017-01-29: 20 mg via INTRAVENOUS
  Administered 2017-01-29: 80 mg via INTRAVENOUS

## 2017-01-29 MED ORDER — EPHEDRINE SULFATE 50 MG/ML IJ SOLN
INTRAMUSCULAR | Status: DC | PRN
  Administered 2017-01-29 (×2): 10 mg via INTRAVENOUS

## 2017-01-29 MED ORDER — PROPOFOL 500 MG/50ML IV EMUL
INTRAVENOUS | Status: AC
  Filled 2017-01-29: qty 50

## 2017-01-29 MED ORDER — LIDOCAINE HCL (PF) 2 % IJ SOLN
INTRAMUSCULAR | Status: DC | PRN
  Administered 2017-01-29: 50 mg via INTRADERMAL

## 2017-01-29 MED ORDER — SODIUM CHLORIDE 0.9 % IV SOLN
INTRAVENOUS | Status: DC | PRN
  Administered 2017-01-29: 08:00:00 via INTRAVENOUS

## 2017-01-29 MED ORDER — SODIUM CHLORIDE 0.9 % IV SOLN
INTRAVENOUS | Status: DC
  Administered 2017-01-29: 08:00:00 via INTRAVENOUS

## 2017-01-29 MED ORDER — PROPOFOL 500 MG/50ML IV EMUL
INTRAVENOUS | Status: DC | PRN
  Administered 2017-01-29: 150 ug/kg/min via INTRAVENOUS

## 2017-01-29 NOTE — Anesthesia Post-op Follow-up Note (Signed)
Anesthesia QCDR form completed.        

## 2017-01-29 NOTE — Op Note (Signed)
[facility] Gastroenterology Patient Name: Jesse Norton Procedure Date: 01/29/2017 7:38 AM MRN: [id number] Account #: [id number] Date of Birth: [birth year] Admit Type: Outpatient Age: 71 Room: [facility] ENDO ROOM 3 Gender: Male Note Status: Finalized Procedure:            Upper GI endoscopy Indications:          Follow-up of Barrett's esophagus Providers:            Lollie Sails, MD Referring MD:         [name], MD (Referring MD) Medicines:            Monitored Anesthesia Care Complications:        No immediate complications. Procedure:            Pre-Anesthesia Assessment:                       - ASA Grade Assessment: III - A patient with severe                        systemic disease.                       After obtaining informed consent, the endoscope was                        passed under direct vision. Throughout the procedure,                        the patient's blood pressure, pulse, and oxygen                        saturations were monitored continuously. The Endoscope                        was introduced through the mouth, and advanced to the                        third part of duodenum. The upper GI endoscopy was                        accomplished without difficulty. The patient tolerated                        the procedure well. Findings:      There were esophageal mucosal changes secondary to established       short-segment Barrett's disease present in the lower third of the       esophagus. The maximum longitudinal extent of these mucosal changes was       2 cm in length. Mucosa was biopsied with a cold forceps for histology in       4 quadrants at intervals of 2 cm at 38 and 40 cm from the incisors. A       total of 2 specimen bottles were sent to pathology.      A single non-bleeding localized erosion was found at the incisura. There       were no stigmata of recent bleeding. Biopsies were taken with a cold       forceps for  histology.      Patchy mild inflammation characterized by congestion (edema) and       erythema  was found in the gastric body. Biopsies were taken with a cold       forceps for histology. Biopsies were taken with a cold forceps for       Helicobacter pylori testing.      The cardia and gastric fundus were normal on retroflexion otherwise.      The examined duodenum was normal. Impression:           - Esophageal mucosal changes secondary to established                        short-segment Barrett's disease. Biopsied.                       - Gastric erosion without bleeding. Biopsied.                       - Gastritis. Biopsied.                       - Normal examined duodenum. Recommendation:       - Discharge patient to home.                       - Await pathology results.                       - Continue present medications. Procedure Code(s):    --- Professional ---                       [id number], Esophagogastroduodenoscopy, flexible, transoral;                        with biopsy, single or multiple Diagnosis Code(s):    --- Professional ---                       K22.70, Barrett's esophagus without dysplasia                       K25.9, Gastric ulcer, unspecified as acute or chronic,                        without hemorrhage or perforation                       K29.70, Gastritis, unspecified, without bleeding CPT copyright 2016 American Medical Association. All rights reserved. The codes documented in this report are preliminary and upon coder review may  be revised to meet current compliance requirements. Lollie Sails, MD 01/29/2017 8:01:34 AM This report has been signed electronically. Number of Addenda: 0 Note Initiated On: 01/29/2017 7:38 AM      [facility]

## 2017-01-29 NOTE — Anesthesia Postprocedure Evaluation (Signed)
Anesthesia Post Note  Patient: Jesse Norton  Procedure(s) Performed: Procedure(s) (LRB): ESOPHAGOGASTRODUODENOSCOPY (EGD) (N/A)  Patient location during evaluation: Endoscopy Anesthesia Type: General Level of consciousness: awake and alert and oriented Pain management: pain level controlled Vital Signs Assessment: post-procedure vital signs reviewed and stable Respiratory status: spontaneous breathing, nonlabored ventilation and respiratory function stable Cardiovascular status: blood pressure returned to baseline and stable Postop Assessment: no signs of nausea or vomiting Anesthetic complications: no     Last Vitals:  Vitals:   01/29/17 0710 01/29/17 0800  BP: 133/90 (!) 81/46  Pulse: 67 65  Resp: 18 (!) 23  Temp: (!) 35.7 C 36.2 C    Last Pain:  Vitals:   01/29/17 0710  TempSrc: Tympanic                 Zohair Epp

## 2017-01-29 NOTE — H&P (Signed)
Outpatient short stay form Pre-procedure 01/29/2017 7:26 AM Lollie Sails MD  Primary Physician: Dr Deborra Medina  Reason for visit:   EGD  History of present illness:   Patient is a 71 year old male presenting today as above. He has a personal history of Barrett's esophagus without dysplasia. He is presenting today for routine follow-up. Does take a proton pump inhibitor, pantoprazole, daily. He denies any heartburn or dysphagia symptoms. He has taken no aspirin products for at least 5 days. He takes no blood thinning agents.     Current Facility-Administered Medications:  .  0.9 %  sodium chloride infusion, , Intravenous, Continuous, Lollie Sails, MD  Prescriptions Prior to Admission  Medication Sig Dispense Refill Last Dose  . allopurinol (ZYLOPRIM) 300 MG tablet Take 1 tablet (300 mg total) by mouth daily. 90 tablet 4 Past Week at Unknown time  . amLODipine (NORVASC) 10 MG tablet Take 1 tablet (10 mg total) by mouth daily. 90 tablet 4 01/28/2017 at Unknown time  . aspirin EC 81 MG tablet Take 81 mg by mouth daily.   Past Week at Unknown time  . celecoxib (CELEBREX) 200 MG capsule Take 1 capsule (200 mg total) by mouth daily. 90 capsule 4 Past Month at Unknown time  . Colchicine (MITIGARE) 0.6 MG CAPS Take 0.6 mg by mouth daily. Take 2 now then 1 a day 30 capsule 4 Past Week at Unknown time  . losartan-hydrochlorothiazide (HYZAAR) 100-25 MG tablet Take 1 tablet by mouth daily. 90 tablet 4 01/28/2017 at Unknown time  . metoprolol tartrate (LOPRESSOR) 25 MG tablet Take 1 tablet (25 mg total) by mouth 2 (two) times daily. 180 tablet 2 01/28/2017 at Unknown time  . Multiple Vitamin (MULTIVITAMIN) tablet Take 1 tablet by mouth daily.   Past Week at Unknown time  . pantoprazole (PROTONIX) 40 MG tablet Take 1 tablet (40 mg total) by mouth daily. 90 tablet 2 01/28/2017 at Unknown time  . LORazepam (ATIVAN) 1 MG tablet Take 0.5-1 tablets (0.5-1 mg total) by mouth daily as needed for anxiety.  30 tablet 1 Taking     No Known Allergies   Past Medical History:  Diagnosis Date  . Anxiety   . Barrett's esophagus   . GERD (gastroesophageal reflux disease)   . Hypertension   . Stomach ulcer     Review of systems:      Physical Exam    Heart and lungs: Regular rate and rhythm without rub or gallop, lungs are bilaterally clear.    HEENT: Normocephalic atraumatic eyes are anicteric    Other:     Pertinant exam for procedure: Soft nontender nondistended bowel sounds positive normoactive    proceedures: EGD and indicated procedures I have discussed the risks benefits and complications of procedures to include not limited to bleeding, infection, perforation and the risk of sedation and the patient wishes to proceed.    Lollie Sails, MD Gastroenterology 01/29/2017  7:26 AM

## 2017-01-29 NOTE — Anesthesia Preprocedure Evaluation (Signed)
Anesthesia Evaluation  Patient identified by MRN, date of birth, ID band Patient awake    Reviewed: Allergy & Precautions, NPO status , Patient's Chart, lab work & pertinent test results  History of Anesthesia Complications Negative for: history of anesthetic complications  Airway Mallampati: III  TM Distance: >3 FB Neck ROM: Full    Dental  (+) Implants   Pulmonary neg pulmonary ROS, neg sleep apnea, neg COPD,    breath sounds clear to auscultation- rhonchi (-) wheezing      Cardiovascular Exercise Tolerance: Good hypertension, Pt. on medications (-) CAD and (-) Past MI  Rhythm:Regular Rate:Normal - Systolic murmurs and - Diastolic murmurs    Neuro/Psych negative neurological ROS  negative psych ROS   GI/Hepatic Neg liver ROS, PUD, GERD  Medicated,  Endo/Other  negative endocrine ROSneg diabetes  Renal/GU negative Renal ROS     Musculoskeletal negative musculoskeletal ROS (+)   Abdominal (+) + obese,   Peds  Hematology negative hematology ROS (+)   Anesthesia Other Findings Past Medical History: No date: Anxiety No date: Barrett's esophagus No date: GERD (gastroesophageal reflux disease) No date: Hypertension No date: Stomach ulcer   Reproductive/Obstetrics                             Anesthesia Physical Anesthesia Plan  ASA: III  Anesthesia Plan: General   Post-op Pain Management:    Induction: Intravenous  Airway Management Planned: Natural Airway  Additional Equipment:   Intra-op Plan:   Post-operative Plan:   Informed Consent: I have reviewed the patients History and Physical, chart, labs and discussed the procedure including the risks, benefits and alternatives for the proposed anesthesia with the patient or authorized representative who has indicated his/her understanding and acceptance.   Dental advisory given  Plan Discussed with: CRNA and  Anesthesiologist  Anesthesia Plan Comments:         Anesthesia Quick Evaluation

## 2017-01-29 NOTE — Transfer of Care (Signed)
Immediate Anesthesia Transfer of Care Note  Patient: Jesse Norton  Procedure(s) Performed: Procedure(s): ESOPHAGOGASTRODUODENOSCOPY (EGD) (N/A)  Patient Location: Endoscopy Unit  Anesthesia Type:General  Level of Consciousness: sedated  Airway & Oxygen Therapy: Patient Spontanous Breathing and Patient connected to nasal cannula oxygen  Post-op Assessment: Report given to RN and Post -op Vital signs reviewed and stable  Post vital signs: Reviewed and stable  Last Vitals:  Vitals:   01/29/17 0710 01/29/17 0800  BP: 133/90 (!) (P) 81/46  Pulse: 67   Resp: 18   Temp: (!) 35.7 C (P) 36.2 C    Last Pain:  Vitals:   01/29/17 0710  TempSrc: Tympanic         Complications: No apparent anesthesia complications

## 2017-01-30 ENCOUNTER — Encounter: Payer: Self-pay | Admitting: Gastroenterology

## 2017-01-30 LAB — SURGICAL PATHOLOGY

## 2017-05-18 ENCOUNTER — Ambulatory Visit (INDEPENDENT_AMBULATORY_CARE_PROVIDER_SITE_OTHER): Payer: Medicare Other

## 2017-05-18 VITALS — BP 128/70 | HR 70 | Temp 97.7°F | Resp 16 | Ht 69.0 in | Wt 257.9 lb

## 2017-05-18 DIAGNOSIS — Z0000 Encounter for general adult medical examination without abnormal findings: Secondary | ICD-10-CM | POA: Diagnosis not present

## 2017-05-18 NOTE — Progress Notes (Signed)
Subjective:   Jesse Norton is a 71 y.o. male who presents for Medicare Annual/Subsequent preventive examination.  Review of Systems:   Cardiac Risk Factors include: male gender;advanced age (>72men, >56 women);hypertension     Objective:    Vitals: BP 128/70 (BP Location: Left Arm, Patient Position: Sitting)   Pulse 70   Temp 97.7 F (36.5 C)   Resp 16   Ht 5\' 9"  (1.753 m)   Wt 257 lb 14.4 oz (117 kg)   BMI 38.09 kg/m   Body mass index is 38.09 kg/m.  Tobacco History  Smoking Status  . Never Smoker  Smokeless Tobacco  . Never Used     Counseling given: Not Answered   Past Medical History:  Diagnosis Date  . Anxiety   . Barrett's esophagus   . GERD (gastroesophageal reflux disease)   . Hypertension   . Stomach ulcer    Past Surgical History:  Procedure Laterality Date  . ESOPHAGOGASTRODUODENOSCOPY N/A 01/29/2017   Procedure: ESOPHAGOGASTRODUODENOSCOPY (EGD);  Surgeon: Lollie Sails, MD;  Location: [facility] ENDOSCOPY;  Service: Endoscopy;  Laterality: N/A;  . ESOPHAGOGASTRODUODENOSCOPY (EGD) WITH PROPOFOL N/A 07/10/2016   Procedure: ESOPHAGOGASTRODUODENOSCOPY (EGD) WITH PROPOFOL;  Surgeon: Lollie Sails, MD;  Location: [facility] ENDOSCOPY;  Service: Endoscopy;  Laterality: N/A;  . left elbow repair Left   . SPLENECTOMY  1957  . TONSILLECTOMY     Family History  Problem Relation Age of Onset  . Adopted: Yes  . Family history unknown: Yes   History  Sexual Activity  . Sexual activity: Not on file    Outpatient Encounter Prescriptions as of 05/18/2017  Medication Sig  . allopurinol (ZYLOPRIM) 300 MG tablet Take 1 tablet (300 mg total) by mouth daily.  Marland Kitchen amLODipine (NORVASC) 10 MG tablet Take 1 tablet (10 mg total) by mouth daily.  Marland Kitchen aspirin EC 81 MG tablet Take 81 mg by mouth daily.  . celecoxib (CELEBREX) 200 MG capsule Take 1 capsule (200 mg total) by mouth daily.  . Colchicine (MITIGARE) 0.6 MG CAPS Take 0.6 mg by mouth daily. Take 2 now then 1 a day    . LORazepam (ATIVAN) 1 MG tablet Take 0.5-1 tablets (0.5-1 mg total) by mouth daily as needed for anxiety.  Marland Kitchen losartan-hydrochlorothiazide (HYZAAR) 100-25 MG tablet Take 1 tablet by mouth daily.  . metoprolol tartrate (LOPRESSOR) 25 MG tablet Take 1 tablet (25 mg total) by mouth 2 (two) times daily.  . pantoprazole (PROTONIX) 40 MG tablet Take 1 tablet (40 mg total) by mouth daily.  . [DISCONTINUED] Multiple Vitamin (MULTIVITAMIN) tablet Take 1 tablet by mouth daily.   No facility-administered encounter medications on file as of 05/18/2017.     Activities of Daily Living In your present state of health, do you have any difficulty performing the following activities: 05/18/2017 05/30/2016  Hearing? N N  Vision? N N  Difficulty concentrating or making decisions? N N  Walking or climbing stairs? N N  Dressing or bathing? N N  Doing errands, shopping? N N  Preparing Food and eating ? N -  Using the Toilet? N -  In the past six months, have you accidently leaked urine? N -  Do you have problems with loss of bowel control? N -  Managing your Medications? N -  Managing your Finances? N -  Housekeeping or managing your Housekeeping? N -  Some recent data might be hidden    Patient Care Team: [name], MD as PCP - General (Family  Medicine) Hulen Luster, MD (Inactive) as Consulting Physician (Gastroenterology)   Assessment:     Exercise Activities and Dietary recommendations Current Exercise Habits: Home exercise routine, Type of exercise: strength training/weights;Other - see comments (bike ride 2x a week, strength training 2-3 x week ), Time (Minutes): > 60, Frequency (Times/Week): 5, Weekly Exercise (Minutes/Week): 0, Intensity: Mild  Goals    . Increase water intake          Recommend drinking at least 4-5 glasses of water a day      Fall Risk Fall Risk  05/18/2017 01/09/2017 05/30/2016 12/03/2015  Falls in the past year? No No No No   Depression Screen PHQ 2/9 Scores  05/18/2017 05/30/2016  PHQ - 2 Score 0 0  PHQ- 9 Score 1 -    Cognitive Function     6CIT Screen 05/18/2017  What Year? 0 points  What month? 0 points  What time? 0 points  Count back from 20 0 points  Months in reverse 0 points  Repeat phrase 0 points  Total Score 0    Immunization History  Administered Date(s) Administered  . Influenza, High Dose Seasonal PF 11/29/2016  . Influenza,inj,Quad PF,36+ Mos 11/22/2015  . Influenza-Unspecified 11/10/2014  . Pneumococcal Conjugate-13 05/12/2014  . Pneumococcal Polysaccharide-23 04/11/2010  . Td 05/12/2014  . Zoster 10/08/2008   Screening Tests Health Maintenance  Topic Date Due  . INFLUENZA VACCINE  07/11/2017  . COLONOSCOPY  04/09/2018  . TETANUS/TDAP  05/12/2024  . Hepatitis C Screening  Completed  . PNA vac Low Risk Adult  Completed      Plan:  I have personally reviewed and addressed the Medicare Annual Wellness questionnaire and have noted the following in the patient's chart:  A. Medical and social history B. Use of alcohol, tobacco or illicit drugs  C. Current medications and supplements D. Functional ability and status E.  Nutritional status F.  Physical activity G. Advance directives H. List of other physicians I.  Hospitalizations, surgeries, and ER visits in previous 12 months J.  [place] such as hearing and vision if needed, cognitive and depression L. Referrals and appointments   In addition, I have reviewed and discussed with patient certain preventive protocols, quality metrics, and best practice recommendations. A written personalized care plan for preventive services as well as general preventive health recommendations were provided to patient.   Signed,  Tyler Aas, LPN Nurse Health Advisor   MD Recommendations: none

## 2017-05-18 NOTE — Patient Instructions (Addendum)
Jesse Norton , Thank you for taking time to come for your Medicare Wellness Visit. I appreciate your ongoing commitment to your health goals. Please review the following plan we discussed and let me know if I can assist you in the future.   Screening recommendations/referrals: Colonoscopy: completed 04/15/2013, due 04/2023 Recommended yearly ophthalmology/optometry visit for glaucoma screening and checkup Recommended yearly dental visit for hygiene and checkup  Vaccinations: Influenza vaccine: up to date, due 11/2017 Pneumococcal vaccine: completed series Tdap vaccine: up to date Shingles vaccine: up to date   Advanced directives: Please bring a copy of your health care of power attorney and living will at your convenience.   Conditions/risks identified:Recommend drinking at least 4-5 glasses of water a day  Next appointment: Follow up on 6/72/2018 at 10:30am with Dr.Crissman. Follow up in one year for your annual wellness exam.   Preventive Care 72 Years and Older, Male Preventive care refers to lifestyle choices and visits with your health care provider that can promote health and wellness. What does preventive care include?  A yearly physical exam. This is also called an annual well check.  Dental exams once or twice a year.  Routine eye exams. Ask your health care provider how often you should have your eyes checked.  Personal lifestyle choices, including:  Daily care of your teeth and gums.  Regular physical activity.  Eating a healthy diet.  Avoiding tobacco and drug use.  Limiting alcohol use.  Practicing safe sex.  Taking low doses of aspirin every day.  Taking vitamin and mineral supplements as recommended by your health care provider. What happens during an annual well check? The services and screenings done by your health care provider during your annual well check will depend on your age, overall health, lifestyle risk factors, and family history of  disease. Counseling  Your health care provider may ask you questions about your:  Alcohol use.  Tobacco use.  Drug use.  Emotional well-being.  Home and relationship well-being.  Sexual activity.  Eating habits.  History of falls.  Memory and ability to understand (cognition).  Work and work Statistician. Screening  You may have the following tests or measurements:  Height, weight, and BMI.  Blood pressure.  Lipid and cholesterol levels. These may be checked every 5 years, or more frequently if you are over 1 years old.  Skin check.  Lung cancer screening. You may have this screening every year starting at age 72 if you have a 30-pack-year history of smoking and currently smoke or have quit within the past 15 years.  Fecal occult blood test (FOBT) of the stool. You may have this test every year starting at age 72.  Flexible sigmoidoscopy or colonoscopy. You may have a sigmoidoscopy every 5 years or a colonoscopy every 10 years starting at age 72.  Prostate cancer screening. Recommendations will vary depending on your family history and other risks.  Hepatitis C blood test.  Hepatitis B blood test.  Sexually transmitted disease (STD) testing.  Diabetes screening. This is done by checking your blood sugar (glucose) after you have not eaten for a while (fasting). You may have this done every 1-3 years.  Abdominal aortic aneurysm (AAA) screening. You may need this if you are a current or former smoker.  Osteoporosis. You may be screened starting at age 72 if you are at high risk. Talk with your health care provider about your test results, treatment options, and if necessary, the need for more tests. Vaccines  Your health care provider may recommend certain vaccines, such as:  Influenza vaccine. This is recommended every year.  Tetanus, diphtheria, and acellular pertussis (Tdap, Td) vaccine. You may need a Td booster every 10 years.  Zoster vaccine. You may  need this after age 72.  Pneumococcal 13-valent conjugate (PCV13) vaccine. One dose is recommended after age 72.  Pneumococcal polysaccharide (PPSV23) vaccine. One dose is recommended after age 72. Talk to your health care provider about which screenings and vaccines you need and how often you need them. This information is not intended to replace advice given to you by your health care provider. Make sure you discuss any questions you have with your health care provider. Document Released: 12/24/2015 Document Revised: 08/16/2016 Document Reviewed: 09/28/2015 Elsevier Interactive Patient Education  2017 [place] Prevention in the Home Falls can cause injuries. They can happen to people of all ages. There are many things you can do to make your home safe and to help prevent falls. What can I do on the outside of my home?  Regularly fix the edges of walkways and driveways and fix any cracks.  Remove anything that might make you trip as you walk through a door, such as a raised step or threshold.  Trim any bushes or trees on the path to your home.  Use bright outdoor lighting.  Clear any walking paths of anything that might make someone trip, such as rocks or tools.  Regularly check to see if handrails are loose or broken. Make sure that both sides of any steps have handrails.  Any raised decks and porches should have guardrails on the edges.  Have any leaves, snow, or ice cleared regularly.  Use sand or salt on walking paths during winter.  Clean up any spills in your garage right away. This includes oil or grease spills. What can I do in the bathroom?  Use night lights.  Install grab bars by the toilet and in the tub and shower. Do not use towel bars as grab bars.  Use non-skid mats or decals in the tub or shower.  If you need to sit down in the shower, use a plastic, non-slip stool.  Keep the floor dry. Clean up any water that spills on the floor as soon as it  happens.  Remove soap buildup in the tub or shower regularly.  Attach bath mats securely with double-sided non-slip rug tape.  Do not have throw rugs and other things on the floor that can make you trip. What can I do in the bedroom?  Use night lights.  Make sure that you have a light by your bed that is easy to reach.  Do not use any sheets or blankets that are too big for your bed. They should not hang down onto the floor.  Have a firm chair that has side arms. You can use this for support while you get dressed.  Do not have throw rugs and other things on the floor that can make you trip. What can I do in the kitchen?  Clean up any spills right away.  Avoid walking on wet floors.  Keep items that you use a lot in easy-to-reach places.  If you need to reach something above you, use a strong step stool that has a grab bar.  Keep electrical cords out of the way.  Do not use floor polish or wax that makes floors slippery. If you must use wax, use non-skid floor wax.  Do  not have throw rugs and other things on the floor that can make you trip. What can I do with my stairs?  Do not leave any items on the stairs.  Make sure that there are handrails on both sides of the stairs and use them. Fix handrails that are broken or loose. Make sure that handrails are as long as the stairways.  Check any carpeting to make sure that it is firmly attached to the stairs. Fix any carpet that is loose or worn.  Avoid having throw rugs at the top or bottom of the stairs. If you do have throw rugs, attach them to the floor with carpet tape.  Make sure that you have a light switch at the top of the stairs and the bottom of the stairs. If you do not have them, ask someone to add them for you. What else can I do to help prevent falls?  Wear shoes that:  Do not have high heels.  Have rubber bottoms.  Are comfortable and fit you well.  Are closed at the toe. Do not wear sandals.  If you  use a stepladder:  Make sure that it is fully opened. Do not climb a closed stepladder.  Make sure that both sides of the stepladder are locked into place.  Ask someone to hold it for you, if possible.  Clearly mark and make sure that you can see:  Any grab bars or handrails.  First and last steps.  Where the edge of each step is.  Use tools that help you move around (mobility aids) if they are needed. These include:  Canes.  Walkers.  Scooters.  Crutches.  Turn on the lights when you go into a dark area. Replace any light bulbs as soon as they burn out.  Set up your furniture so you have a clear path. Avoid moving your furniture around.  If any of your floors are uneven, fix them.  If there are any pets around you, be aware of where they are.  Review your medicines with your doctor. Some medicines can make you feel dizzy. This can increase your chance of falling. Ask your doctor what other things that you can do to help prevent falls. This information is not intended to replace advice given to you by your health care provider. Make sure you discuss any questions you have with your health care provider. Document Released: 09/23/2009 Document Revised: 05/04/2016 Document Reviewed: 01/01/2015 Elsevier Interactive Patient Education  2017 [employer].

## 2017-05-21 ENCOUNTER — Encounter: Payer: Self-pay | Admitting: Family Medicine

## 2017-05-31 ENCOUNTER — Encounter: Payer: Medicare Other | Admitting: Family Medicine

## 2017-06-07 ENCOUNTER — Encounter: Payer: Self-pay | Admitting: Family Medicine

## 2017-06-07 ENCOUNTER — Encounter: Payer: Medicare Other | Admitting: Family Medicine

## 2017-06-07 ENCOUNTER — Ambulatory Visit (INDEPENDENT_AMBULATORY_CARE_PROVIDER_SITE_OTHER): Payer: Medicare Other | Admitting: Family Medicine

## 2017-06-07 VITALS — BP 132/82 | HR 62 | Wt 257.0 lb

## 2017-06-07 DIAGNOSIS — Z131 Encounter for screening for diabetes mellitus: Secondary | ICD-10-CM

## 2017-06-07 DIAGNOSIS — Z1329 Encounter for screening for other suspected endocrine disorder: Secondary | ICD-10-CM | POA: Diagnosis not present

## 2017-06-07 DIAGNOSIS — Z125 Encounter for screening for malignant neoplasm of prostate: Secondary | ICD-10-CM

## 2017-06-07 DIAGNOSIS — K22719 Barrett's esophagus with dysplasia, unspecified: Secondary | ICD-10-CM

## 2017-06-07 DIAGNOSIS — Z13220 Encounter for screening for lipoid disorders: Secondary | ICD-10-CM

## 2017-06-07 DIAGNOSIS — F419 Anxiety disorder, unspecified: Secondary | ICD-10-CM | POA: Diagnosis not present

## 2017-06-07 DIAGNOSIS — I10 Essential (primary) hypertension: Secondary | ICD-10-CM | POA: Diagnosis not present

## 2017-06-07 DIAGNOSIS — M10072 Idiopathic gout, left ankle and foot: Secondary | ICD-10-CM | POA: Diagnosis not present

## 2017-06-07 LAB — URINALYSIS, ROUTINE W REFLEX MICROSCOPIC
BILIRUBIN UA: NEGATIVE
GLUCOSE, UA: NEGATIVE
Ketones, UA: NEGATIVE
Nitrite, UA: NEGATIVE
PH UA: 6 (ref 5.0–7.5)
Protein, UA: NEGATIVE
Specific Gravity, UA: 1.02 (ref 1.005–1.030)
UUROB: 0.2 mg/dL (ref 0.2–1.0)

## 2017-06-07 LAB — MICROSCOPIC EXAMINATION

## 2017-06-07 MED ORDER — LOSARTAN POTASSIUM-HCTZ 100-25 MG PO TABS: 1.0000 | ORAL_TABLET | Freq: Every day | ORAL | 4 refills | Status: DC

## 2017-06-07 MED ORDER — PANTOPRAZOLE SODIUM 40 MG PO TBEC: 40.0000 mg | DELAYED_RELEASE_TABLET | Freq: Every day | ORAL | 4 refills | Status: DC

## 2017-06-07 MED ORDER — METOPROLOL TARTRATE 25 MG PO TABS: 25.0000 mg | ORAL_TABLET | Freq: Two times a day (BID) | ORAL | 4 refills | Status: DC

## 2017-06-07 MED ORDER — ALLOPURINOL 300 MG PO TABS: 300.0000 mg | ORAL_TABLET | Freq: Every day | ORAL | 4 refills | Status: DC

## 2017-06-07 MED ORDER — LORAZEPAM 1 MG PO TABS: 0.5000 mg | ORAL_TABLET | Freq: Every day | ORAL | 1 refills | Status: DC | PRN

## 2017-06-07 MED ORDER — CELECOXIB 200 MG PO CAPS: 200.0000 mg | ORAL_CAPSULE | Freq: Every day | ORAL | 4 refills | Status: DC

## 2017-06-07 MED ORDER — AMLODIPINE BESYLATE 10 MG PO TABS: 10.0000 mg | ORAL_TABLET | Freq: Every day | ORAL | 4 refills | Status: DC

## 2017-06-07 NOTE — Assessment & Plan Note (Signed)
The current medical regimen is effective;  continue present plan and medications.  

## 2017-06-07 NOTE — Progress Notes (Addendum)
BP 132/82 (BP Location: Left Arm)   Pulse 62   Wt 257 lb (116.6 kg)   SpO2 99%   BMI 37.95 kg/m    Subjective:    Patient ID: Jesse Norton, male    DOB: 04-02-1946, 71 y.o.   MRN: [id number]  HPI: Jesse Norton is a 71 y.o. male  Chief Complaint  Patient presents with  . Annual Exam  Patient follow-up medical conditions. Pt taken allopurinol 4 times a week as an occasional gout symptoms. Blood pressure doing well home blood pressure checks also doing well. Celebrex takes 34 days a week for back pain and long walks does okay with that. Lorazepam takes for stormy days 60 pills of lasted more than 6 months. Protonix working well f reflux had endoscopy earlier this year  Relevant past medical, surgical, family and social history reviewed and updated as indicated. Interim medical history since our last visit reviewed. Allergies and medications reviewed and updated.  Review of Systems  Constitutional: Negative.   HENT: Negative.   Eyes: Negative.   Respiratory: Negative.   Cardiovascular: Negative.   Gastrointestinal: Negative.   Endocrine: Negative.   Genitourinary: Negative.   Musculoskeletal: Negative.   Skin: Negative.   Allergic/Immunologic: Negative.   Neurological: Negative.   Hematological: Negative.   Psychiatric/Behavioral: Negative.     Per HPI unless specifically indicated above     Objective:    BP 132/82 (BP Location: Left Arm)   Pulse 62   Wt 257 lb (116.6 kg)   SpO2 99%   BMI 37.95 kg/m   Wt Readings from Last 3 Encounters:  06/07/17 257 lb (116.6 kg)  05/18/17 257 lb 14.4 oz (117 kg)  01/29/17 230 lb (104.3 kg)    Physical Exam  Constitutional: He is oriented to person, place, and time. He appears well-developed and well-nourished.  HENT:  Head: Normocephalic and atraumatic.  Right Ear: External ear normal.  Left Ear: External ear normal.  Eyes: Conjunctivae and EOM are normal. Pupils are equal, round, and reactive to light.    Neck: Normal range of motion. Neck supple.  Cardiovascular: Normal rate, regular rhythm, normal heart sounds and intact distal pulses.   Pulmonary/Chest: Effort normal and breath sounds normal.  Abdominal: Soft. Bowel sounds are normal. There is no splenomegaly or hepatomegaly.  Genitourinary: Rectum normal, prostate normal and penis normal.  Musculoskeletal: Normal range of motion.  Neurological: He is alert and oriented to person, place, and time. He has normal reflexes.  Skin: No rash noted. No erythema.  Psychiatric: He has a normal mood and affect. His behavior is normal. Judgment and thought content normal.        Assessment & Plan:   Problem List Items Addressed This Visit      Cardiovascular and Mediastinum   Essential hypertension - Primary    The current medical regimen is effective;  continue present plan and medications.       Relevant Orders   CBC with Differential/Platelet     Digestive   Barrett's esophagus    The current medical regimen is effective;  continue present plan and medications.         Other   Acute anxiety   Gout    occ gout sx takes only 4 a week       Other Visit Diagnoses    Screening for diabetes mellitus (DM)       Relevant Orders   Comprehensive metabolic panel   Urinalysis, Routine w reflex  microscopic   Screening cholesterol level       Relevant Orders   Lipid panel   Prostate cancer screening       Relevant Orders   PSA   Thyroid disorder screen       Relevant Orders   TSH      Also discussed microscopic hematuria patient had evaluation before from urology  Follow up plan: Return in about 6 months (around 12/07/2017) for BMP.

## 2017-06-07 NOTE — Assessment & Plan Note (Signed)
occ gout sx takes only 4 a week

## 2017-06-08 LAB — CBC WITH DIFFERENTIAL/PLATELET
BASOS: 1 %
Basophils Absolute: 0.1 10*3/uL (ref 0.0–0.2)
EOS (ABSOLUTE): 0.1 10*3/uL (ref 0.0–0.4)
EOS: 1 %
HEMATOCRIT: 42.1 % (ref 37.5–51.0)
HEMOGLOBIN: 13.9 g/dL (ref 13.0–17.7)
IMMATURE GRANS (ABS): 0 10*3/uL (ref 0.0–0.1)
IMMATURE GRANULOCYTES: 0 %
LYMPHS: 46 %
Lymphocytes Absolute: 3 10*3/uL (ref 0.7–3.1)
MCH: 32.5 pg (ref 26.6–33.0)
MCHC: 33 g/dL (ref 31.5–35.7)
MCV: 98 fL — ABNORMAL HIGH (ref 79–97)
Monocytes Absolute: 0.5 10*3/uL (ref 0.1–0.9)
Monocytes: 8 %
NEUTROS ABS: 2.9 10*3/uL (ref 1.4–7.0)
Neutrophils: 44 %
Platelets: 236 10*3/uL (ref 150–379)
RBC: 4.28 x10E6/uL (ref 4.14–5.80)
RDW: 14.2 % (ref 12.3–15.4)
WBC: 6.4 10*3/uL (ref 3.4–10.8)

## 2017-06-08 LAB — LIPID PANEL
Chol/HDL Ratio: 3 ratio (ref 0.0–5.0)
Cholesterol, Total: 166 mg/dL (ref 100–199)
HDL: 56 mg/dL (ref >39)
LDL Calculated: 85 mg/dL (ref 0–99)
Triglycerides: 127 mg/dL (ref 0–149)
VLDL CHOLESTEROL CAL: 25 mg/dL (ref 5–40)

## 2017-06-08 LAB — COMPREHENSIVE METABOLIC PANEL
A/G RATIO: 2.3 — AB (ref 1.2–2.2)
ALBUMIN: 4.5 g/dL (ref 3.5–4.8)
ALT: 24 IU/L (ref 0–44)
AST: 25 IU/L (ref 0–40)
Alkaline Phosphatase: 47 IU/L (ref 39–117)
BILIRUBIN TOTAL: 0.6 mg/dL (ref 0.0–1.2)
BUN / CREAT RATIO: 16 (ref 10–24)
BUN: 18 mg/dL (ref 8–27)
CALCIUM: 9.3 mg/dL (ref 8.6–10.2)
CHLORIDE: 101 mmol/L (ref 96–106)
CO2: 27 mmol/L (ref 20–29)
Creatinine, Ser: 1.11 mg/dL (ref 0.76–1.27)
GFR calc non Af Amer: 66 mL/min/{1.73_m2} (ref >59)
GFR, EST AFRICAN AMERICAN: 76 mL/min/{1.73_m2} (ref >59)
Globulin, Total: 2 g/dL (ref 1.5–4.5)
Glucose: 115 mg/dL — ABNORMAL HIGH (ref 65–99)
POTASSIUM: 3.8 mmol/L (ref 3.5–5.2)
Sodium: 143 mmol/L (ref 134–144)
TOTAL PROTEIN: 6.5 g/dL (ref 6.0–8.5)

## 2017-06-08 LAB — URIC ACID: Uric Acid: 5.6 mg/dL (ref 3.7–8.6)

## 2017-06-08 LAB — PSA: PROSTATE SPECIFIC AG, SERUM: 2.1 ng/mL (ref 0.0–4.0)

## 2017-06-08 LAB — TSH: TSH: 1.51 u[IU]/mL (ref 0.450–4.500)

## 2017-06-11 ENCOUNTER — Encounter: Payer: Self-pay | Admitting: Family Medicine

## 2017-06-19 ENCOUNTER — Other Ambulatory Visit: Payer: Self-pay | Admitting: Family Medicine

## 2017-06-19 NOTE — Telephone Encounter (Signed)
  Last routine OV: 01/09/17 Next OV: 12/13/16

## 2017-07-15 ENCOUNTER — Other Ambulatory Visit: Payer: Self-pay | Admitting: Family Medicine

## 2017-07-16 NOTE — Telephone Encounter (Signed)
  Last routine OV: 06/07/17 Next OV: 12/13/17

## 2017-07-20 ENCOUNTER — Telehealth: Payer: Self-pay | Admitting: *Deleted

## 2017-07-20 NOTE — Telephone Encounter (Signed)
Called and spoke to [place]. Incorrect patient, they need records on Jibreel Fedewa DOB 11-Jun-2046. Donne Baley is not a patient at [facility].

## 2017-07-20 NOTE — Telephone Encounter (Signed)
Blair from [facility] has requested any office notes, imagining and reports in associated to patients [employer]fl[facility].  Contact [phone] Fax [phone]   

## 2017-10-11 ENCOUNTER — Ambulatory Visit (INDEPENDENT_AMBULATORY_CARE_PROVIDER_SITE_OTHER): Payer: Medicare Other

## 2017-10-11 DIAGNOSIS — Z23 Encounter for immunization: Secondary | ICD-10-CM

## 2017-11-02 ENCOUNTER — Ambulatory Visit: Payer: Self-pay

## 2017-11-02 NOTE — Telephone Encounter (Signed)
Pt. called to report episode of dizziness, nausea, and sensation of the room spinning at 12 midnight, last night.  Stated the episode started again at about 4:00 AM, and described as "severe."  Stated he applied cold compresses, and just tried to focus his vision on one point.  Reported when he gets up and moves around, his balance feels unsteady.  Reported the symptoms are slightly better @ this time.  Asking if Dr. Jeananne Rama could call him in something for the Vertigo?  Reported he had a similar episode about 5 years ago.  Questioned if had any headache, vision change, weakness of extremities; denied all of the above.  Pt. able to articulate his thoughts without difficulty.  The pt. Stated he is about 2 hrs. away from home, and will return on Sun. night.  Advised that Dr. Rance Muir office is closed at this time.  Advised the protocol recommends he see the MD within 24 hrs.  Stated he will need to go to an UC.  The pt. stated he is starting to feel better, and will go to UC if the symptoms reoccur.  Offered to schedule an appt. for next week, with Dr. Jeananne Rama.  The pt. stated he will call on Monday, if he wants to make an appt.            Reason for Disposition . [1] MODERATE dizziness (e.g., vertigo; feels very unsteady, interferes with normal activities) AND [2] has NOT been evaluated by physician for this  Answer Assessment - Initial Assessment Questions 1. DESCRIPTION: "Describe your dizziness."     Room spinning 2. VERTIGO: "Do you feel like either you or the room is spinning or tilting?"      Had episode of room spinning at 12:00 MN; awoke at 4:00 AM and another episode; "felt like I was on a [place]"     3. LIGHTHEADED: "Do you feel lightheaded?" (e.g., somewhat faint, woozy, weak upon standing)     Denies feeling faint or woozy.  States balance is off. 4. SEVERITY: "How bad is it?"  "Can you walk?"   - MILD - Feels unsteady but walking normally.   - MODERATE - Feels very unsteady when  walking, but not falling; interferes with normal activities (e.g., school, work) .   - SEVERE - Unable to walk without falling (requires assistance).     Was severe at 4:00 AM, then moderate 5. ONSET:  "When did the dizziness begin?"     12:00 MD  6. AGGRAVATING FACTORS: "Does anything make it worse?" (e.g., standing, change in head position)     Moving quickly 7. CAUSE: "What do you think is causing the dizziness?"     Thinks it is an episode of vertigo 8. RECURRENT SYMPTOM: "Have you had dizziness before?" If so, ask: "When was the last time?" "What happened that time?"     Yes; treated by PCP 9. OTHER SYMPTOMS: "Do you have any other symptoms?" (e.g., headache, weakness, numbness, vomiting, earache)     No other symptoms; has had tinnitus several years 10. PREGNANCY: "Is there any chance you are pregnant?" "When was your last menstrual period?"       n/a  Protocols used: DIZZINESS - VERTIGO-A-AH

## 2017-12-13 ENCOUNTER — Ambulatory Visit: Payer: Medicare Other | Admitting: Family Medicine

## 2017-12-25 ENCOUNTER — Ambulatory Visit (INDEPENDENT_AMBULATORY_CARE_PROVIDER_SITE_OTHER): Payer: Medicare Other | Admitting: Family Medicine

## 2017-12-25 ENCOUNTER — Encounter: Payer: Self-pay | Admitting: Family Medicine

## 2017-12-25 VITALS — BP 120/82 | HR 67 | Wt 255.0 lb

## 2017-12-25 DIAGNOSIS — I10 Essential (primary) hypertension: Secondary | ICD-10-CM | POA: Diagnosis not present

## 2017-12-25 DIAGNOSIS — F419 Anxiety disorder, unspecified: Secondary | ICD-10-CM | POA: Diagnosis not present

## 2017-12-25 DIAGNOSIS — M10072 Idiopathic gout, left ankle and foot: Secondary | ICD-10-CM | POA: Diagnosis not present

## 2017-12-25 MED ORDER — LORAZEPAM 1 MG PO TABS: 0.5000 mg | ORAL_TABLET | Freq: Every day | ORAL | 1 refills | Status: DC | PRN

## 2017-12-25 NOTE — Assessment & Plan Note (Signed)
The current medical regimen is effective;  continue present plan and medications.  

## 2017-12-25 NOTE — Assessment & Plan Note (Signed)
Reviewed gout patient will stop allopurinol for a month if symptoms challenge with allopurinol notify us if symptoms come back

## 2017-12-25 NOTE — Progress Notes (Signed)
BP 120/82 (BP Location: Left Arm)   Pulse 67   Wt 255 lb (115.7 kg)   SpO2 98%   BMI 37.66 kg/m    Subjective:    Patient ID: Jesse Norton, male    DOB: 1946-06-05, 72 y.o.   MRN: [id number]  HPI: Jesse Norton is a 72 y.o. male  Chief Complaint  Patient presents with  . Follow-up  . Hypertension  . Dizziness    3 times in last 10 years, last one Thanksgiving night  . Wrist Pain  . Medication Problem  Patient with ongoing hypertension good control on medications with no side effects Has had some rare dizzy spells discussed meclizine with patient for dizziness Wrist pain when weight riding his bicycle on his drops for a long time is nonpainful Reviewed concerned that allopurinol may be causing some joint aches Stable with medication no side effects or issues no gout symptoms  Relevant past medical, surgical, family and social history reviewed and updated as indicated. Interim medical history since our last visit reviewed. Allergies and medications reviewed and updated.  Review of Systems  Constitutional: Negative.   Respiratory: Negative.   Cardiovascular: Negative.     Per HPI unless specifically indicated above     Objective:    BP 120/82 (BP Location: Left Arm)   Pulse 67   Wt 255 lb (115.7 kg)   SpO2 98%   BMI 37.66 kg/m   Wt Readings from Last 3 Encounters:  12/25/17 255 lb (115.7 kg)  06/07/17 257 lb (116.6 kg)  05/18/17 257 lb 14.4 oz (117 kg)    Physical Exam  Constitutional: He is oriented to person, place, and time. He appears well-developed and well-nourished.  HENT:  Head: Normocephalic and atraumatic.  Eyes: Conjunctivae and EOM are normal.  Neck: Normal range of motion.  Cardiovascular: Normal rate, regular rhythm and normal heart sounds.  Pulmonary/Chest: Effort normal and breath sounds normal.  Musculoskeletal: Normal range of motion.  Wrist with no obvious lesions  Neurological: He is alert and oriented to person, place, and  time.  Skin: No erythema.  Psychiatric: He has a normal mood and affect. His behavior is normal. Judgment and thought content normal.    Results for orders placed or performed in visit on 06/07/17  Microscopic Examination  Result Value Ref Range   WBC, UA 6-10 (A) 0 - 5 /hpf   RBC, UA 11-30 (A) 0 - 2 /hpf   Epithelial Cells (non renal) 0-10 0 - 10 /hpf   Bacteria, UA Few (A) None seen/Few  CBC with Differential/Platelet  Result Value Ref Range   WBC 6.4 3.4 - 10.8 x10E3/uL   RBC 4.28 4.14 - 5.80 x10E6/uL   Hemoglobin 13.9 13.0 - 17.7 g/dL   Hematocrit 42.1 37.5 - 51.0 %   MCV 98 (H) 79 - 97 fL   MCH 32.5 26.6 - 33.0 pg   MCHC 33.0 31.5 - 35.7 g/dL   RDW 14.2 12.3 - 15.4 %   Platelets 236 150 - 379 x10E3/uL   Neutrophils 44 Not Estab. %   Lymphs 46 Not Estab. %   Monocytes 8 Not Estab. %   Eos 1 Not Estab. %   Basos 1 Not Estab. %   Neutrophils Absolute 2.9 1.4 - 7.0 x10E3/uL   Lymphocytes Absolute 3.0 0.7 - 3.1 x10E3/uL   Monocytes Absolute 0.5 0.1 - 0.9 x10E3/uL   EOS (ABSOLUTE) 0.1 0.0 - 0.4 x10E3/uL   Basophils Absolute 0.1 0.0 -  0.2 x10E3/uL   Immature Granulocytes 0 Not Estab. %   Immature Grans (Abs) 0.0 0.0 - 0.1 x10E3/uL  Comprehensive metabolic panel  Result Value Ref Range   Glucose 115 (H) 65 - 99 mg/dL   BUN 18 8 - 27 mg/dL   Creatinine, Ser 1.11 0.76 - 1.27 mg/dL   GFR calc non Af Amer 66 >59 mL/min/1.73   GFR calc Af Amer 76 >59 mL/min/1.73   BUN/Creatinine Ratio 16 10 - 24   Sodium 143 134 - 144 mmol/L   Potassium 3.8 3.5 - 5.2 mmol/L   Chloride 101 96 - 106 mmol/L   CO2 27 20 - 29 mmol/L   Calcium 9.3 8.6 - 10.2 mg/dL   Total Protein 6.5 6.0 - 8.5 g/dL   Albumin 4.5 3.5 - 4.8 g/dL   Globulin, Total 2.0 1.5 - 4.5 g/dL   Albumin/Globulin Ratio 2.3 (H) 1.2 - 2.2   Bilirubin Total 0.6 0.0 - 1.2 mg/dL   Alkaline Phosphatase 47 39 - 117 IU/L   AST 25 0 - 40 IU/L   ALT 24 0 - 44 IU/L  Lipid panel  Result Value Ref Range   Cholesterol, Total 166 100 -  199 mg/dL   Triglycerides 127 0 - 149 mg/dL   HDL 56 >39 mg/dL   VLDL Cholesterol Cal 25 5 - 40 mg/dL   LDL Calculated 85 0 - 99 mg/dL   Chol/HDL Ratio 3.0 0.0 - 5.0 ratio  PSA  Result Value Ref Range   Prostate Specific Ag, Serum 2.1 0.0 - 4.0 ng/mL  TSH  Result Value Ref Range   TSH 1.510 0.450 - 4.500 uIU/mL  Urinalysis, Routine w reflex microscopic  Result Value Ref Range   Specific Gravity, UA 1.020 1.005 - 1.030   pH, UA 6.0 5.0 - 7.5   Color, UA Yellow Yellow   Appearance Ur Clear Clear   Leukocytes, UA Trace (A) Negative   Protein, UA Negative Negative/Trace   Glucose, UA Negative Negative   Ketones, UA Negative Negative   RBC, UA 2+ (A) Negative   Bilirubin, UA Negative Negative   Urobilinogen, Ur 0.2 0.2 - 1.0 mg/dL   Nitrite, UA Negative Negative   Microscopic Examination See below:   Uric acid  Result Value Ref Range   Uric Acid 5.6 3.7 - 8.6 mg/dL      Assessment & Plan:   Problem List Items Addressed This Visit      Cardiovascular and Mediastinum   Essential hypertension - Primary    The current medical regimen is effective;  continue present plan and medications.       Relevant Orders   Basic metabolic panel     Other   Acute anxiety    The current medical regimen is effective;  continue present plan and medications.       Relevant Medications   LORazepam (ATIVAN) 1 MG tablet   Gout    Reviewed gout patient will stop allopurinol for a month if symptoms challenge with allopurinol notify us if symptoms come back         Discussed wrist care and treatment etc. Follow up plan: Return in about 6 months (around 06/24/2018) for Physical Exam.

## 2017-12-26 ENCOUNTER — Encounter: Payer: Self-pay | Admitting: Family Medicine

## 2017-12-26 LAB — BASIC METABOLIC PANEL
BUN/Creatinine Ratio: 12 (ref 10–24)
BUN: 13 mg/dL (ref 8–27)
CALCIUM: 9.6 mg/dL (ref 8.6–10.2)
CO2: 26 mmol/L (ref 20–29)
CREATININE: 1.07 mg/dL (ref 0.76–1.27)
Chloride: 99 mmol/L (ref 96–106)
GFR calc Af Amer: 80 mL/min/{1.73_m2} (ref >59)
GFR, EST NON AFRICAN AMERICAN: 69 mL/min/{1.73_m2} (ref >59)
Glucose: 127 mg/dL — ABNORMAL HIGH (ref 65–99)
Potassium: 4.3 mmol/L (ref 3.5–5.2)
Sodium: 142 mmol/L (ref 134–144)

## 2018-02-20 ENCOUNTER — Ambulatory Visit (INDEPENDENT_AMBULATORY_CARE_PROVIDER_SITE_OTHER): Payer: Medicare Other | Admitting: Family Medicine

## 2018-02-20 ENCOUNTER — Encounter: Payer: Self-pay | Admitting: Family Medicine

## 2018-02-20 VITALS — BP 157/81 | HR 89 | Temp 97.6°F | Wt 259.6 lb

## 2018-02-20 DIAGNOSIS — J069 Acute upper respiratory infection, unspecified: Secondary | ICD-10-CM

## 2018-02-20 MED ORDER — ALBUTEROL SULFATE HFA 108 (90 BASE) MCG/ACT IN AERS: 2.0000 | INHALATION_SPRAY | Freq: Four times a day (QID) | RESPIRATORY_TRACT | 0 refills | Status: DC | PRN

## 2018-02-20 MED ORDER — ALBUTEROL SULFATE (2.5 MG/3ML) 0.083% IN NEBU: 2.5000 mg | INHALATION_SOLUTION | Freq: Once | RESPIRATORY_TRACT | Status: DC

## 2018-02-20 MED ORDER — PREDNISONE 20 MG PO TABS: 40.0000 mg | ORAL_TABLET | Freq: Every day | ORAL | 0 refills | Status: DC

## 2018-02-20 MED ORDER — HYDROCOD POLST-CPM POLST ER 10-8 MG/5ML PO SUER: 5.0000 mL | Freq: Two times a day (BID) | ORAL | 0 refills | Status: DC | PRN

## 2018-02-20 MED ORDER — AZITHROMYCIN 250 MG PO TABS: ORAL_TABLET | ORAL | 0 refills | Status: DC

## 2018-02-20 NOTE — Progress Notes (Signed)
BP (!) 157/81 (BP Location: Left Arm, Patient Position: Sitting, Cuff Size: Normal)   Pulse 89   Temp 97.6 F (36.4 C) (Oral)   Wt 259 lb 9.6 oz (117.8 kg)   SpO2 99%   BMI 38.34 kg/m    Subjective:    Patient ID: Jesse Norton, male    DOB: July 13, 1946, 72 y.o.   MRN: [id number]  HPI: Jesse Norton is a 72 y.o. male  Chief Complaint  Patient presents with  . Cough    Productive. x's 6 days. Progressively has became worse.  . Sinusitis  . Nasal Congestion   1 week of fatigue, watery eyes, rhinorrhea, head congestion. Progressively worsening now with facial pain and pressure, chest congestion, hacking cough, chest tightness, wheezing. Denies fevers,chills, SOB. Doing sinus rinses twice daily, taking zinc lozenges, and coricidin with no relief. Lots of sick contacts. No hx of pulmonary dz.   Relevant past medical, surgical, family and social history reviewed and updated as indicated. Interim medical history since our last visit reviewed. Allergies and medications reviewed and updated.  Review of Systems  Per HPI unless specifically indicated above     Objective:    BP (!) 157/81 (BP Location: Left Arm, Patient Position: Sitting, Cuff Size: Normal)   Pulse 89   Temp 97.6 F (36.4 C) (Oral)   Wt 259 lb 9.6 oz (117.8 kg)   SpO2 99%   BMI 38.34 kg/m   Wt Readings from Last 3 Encounters:  02/20/18 259 lb 9.6 oz (117.8 kg)  12/25/17 255 lb (115.7 kg)  06/07/17 257 lb (116.6 kg)    Physical Exam  Constitutional: He is oriented to person, place, and time. He appears well-developed and well-nourished. No distress.  HENT:  Head: Atraumatic.  B/l middle ear effusion Oropharynx and nasal mucosa erythematous with thick drainage present  Eyes: Conjunctivae are normal. Pupils are equal, round, and reactive to light.  Neck: Normal range of motion. Neck supple.  Cardiovascular: Normal rate and normal heart sounds.  Pulmonary/Chest: Effort normal. No respiratory distress.  He has wheezes.  Musculoskeletal: Normal range of motion.  Neurological: He is alert and oriented to person, place, and time.  Skin: Skin is warm and dry.  Psychiatric: He has a normal mood and affect. His behavior is normal.  Nursing note and vitals reviewed.  Results for orders placed or performed in visit on 75/17/00  Basic metabolic panel  Result Value Ref Range   Glucose 127 (H) 65 - 99 mg/dL   BUN 13 8 - 27 mg/dL   Creatinine, Ser 1.07 0.76 - 1.27 mg/dL   GFR calc non Af Amer 69 >59 mL/min/1.73   GFR calc Af Amer 80 >59 mL/min/1.73   BUN/Creatinine Ratio 12 10 - 24   Sodium 142 134 - 144 mmol/L   Potassium 4.3 3.5 - 5.2 mmol/L   Chloride 99 96 - 106 mmol/L   CO2 26 20 - 29 mmol/L   Calcium 9.6 8.6 - 10.2 mg/dL      Assessment & Plan:   Problem List Items Addressed This Visit    None    Visit Diagnoses    Upper respiratory tract infection, unspecified type    -  Primary   Will tx with zpack, tessalon, tussionex, and prednisone burst. Continue coricidin, OTC supportive care reviewed at length. F/u if no better   Relevant Medications   albuterol (PROVENTIL) (2.5 MG/3ML) 0.083% nebulizer solution 2.5 mg   azithromycin (ZITHROMAX) 250 MG tablet  Follow up plan: Return if symptoms worsen or fail to improve.

## 2018-02-23 NOTE — Patient Instructions (Signed)
Follow up if no better

## 2018-05-23 ENCOUNTER — Ambulatory Visit (INDEPENDENT_AMBULATORY_CARE_PROVIDER_SITE_OTHER): Payer: Medicare Other

## 2018-05-23 VITALS — BP 152/82 | HR 72 | Temp 98.1°F | Resp 17 | Ht 68.5 in | Wt 270.5 lb

## 2018-05-23 DIAGNOSIS — Z0000 Encounter for general adult medical examination without abnormal findings: Secondary | ICD-10-CM

## 2018-05-23 DIAGNOSIS — Z1211 Encounter for screening for malignant neoplasm of colon: Secondary | ICD-10-CM | POA: Diagnosis not present

## 2018-05-23 NOTE — Progress Notes (Signed)
Subjective:   Jesse Norton is a 72 y.o. male who presents for Medicare Annual/Subsequent preventive examination.  Review of Systems:  Cardiac Risk Factors include: advanced age (>68men, >61 women);male gender;hypertension;obesity (BMI >30kg/m2);dyslipidemia     Objective:    Vitals: BP (!) 152/82 (BP Location: Left Arm, Patient Position: Sitting)   Pulse 72   Temp 98.1 F (36.7 C) (Temporal)   Resp 17   Ht 5' 8.5" (1.74 m)   Wt 270 lb 8 oz (122.7 kg)   BMI 40.53 kg/m   Body mass index is 40.53 kg/m.  Advanced Directives 05/23/2018 05/18/2017 01/29/2017 07/10/2016  Does Patient Have a Medical Advance Directive? Yes Yes Yes No  Type of Paramedic of [place];Living will [place];Living will Living will;Healthcare Power of Attorney -  Copy of [place] in Chart? No - copy requested No - copy requested No - copy requested -  Would patient like information on creating a medical advance directive? - - - No - patient declined information    Tobacco Social History   Tobacco Use  Smoking Status Never Smoker  Smokeless Tobacco Never Used     Counseling given: Not Answered   Clinical Intake:  Pre-visit preparation completed: Yes  Pain : No/denies pain     Nutritional Status: BMI > 30  Obese Nutritional Risks: None Diabetes: No  How often do you need to have someone help you when you read instructions, pamphlets, or other written materials from your doctor or pharmacy?: 1 - Never What is the last grade level you completed in school?: masters  degree  Interpreter Needed?: No  Information entered by :: Tiffany Hill,LPN   Past Medical History:  Diagnosis Date  . Anxiety   . Barrett's esophagus   . GERD (gastroesophageal reflux disease)   . Hypertension   . Stomach ulcer    Past Surgical History:  Procedure Laterality Date  . ESOPHAGOGASTRODUODENOSCOPY N/A 01/29/2017   Procedure:  ESOPHAGOGASTRODUODENOSCOPY (EGD);  Surgeon: Lollie Sails, MD;  Location: [facility] ENDOSCOPY;  Service: Endoscopy;  Laterality: N/A;  . ESOPHAGOGASTRODUODENOSCOPY (EGD) WITH PROPOFOL N/A 07/10/2016   Procedure: ESOPHAGOGASTRODUODENOSCOPY (EGD) WITH PROPOFOL;  Surgeon: Lollie Sails, MD;  Location: [facility] ENDOSCOPY;  Service: Endoscopy;  Laterality: N/A;  . left elbow repair Left   . SPLENECTOMY  1957  . TONSILLECTOMY     Family History  Adopted: Yes  Family history unknown: Yes   Social History   Socioeconomic History  . Marital status: Married    Spouse name: Not on file  . Number of children: Not on file  . Years of education: Not on file  . Highest education level: Not on file  Occupational History  . Not on file  Social Needs  . Financial resource strain: Not hard at all  . Food insecurity:    Worry: Never true    Inability: Never true  . Transportation needs:    Medical: No    Non-medical: No  Tobacco Use  . Smoking status: Never Smoker  . Smokeless tobacco: Never Used  Substance and Sexual Activity  . Alcohol use: Yes    Alcohol/week: 3.0 oz    Types: 5 Glasses of wine per week  . Drug use: No  . Sexual activity: Not on file  Lifestyle  . Physical activity:    Days per week: 4 days    Minutes per session: 60 min  . Stress: Not at all  Relationships  .  Social connections:    Talks on phone: More than three times a week    Gets together: More than three times a week    Attends religious service: More than 4 times per year    Active member of club or organization: Yes    Attends meetings of clubs or organizations: More than 4 times per year    Relationship status: Married  Other Topics Concern  . Not on file  Social History Narrative   [employer]    Outpatient Encounter Medications as of 05/23/2018  Medication Sig  . allopurinol (ZYLOPRIM) 300 MG tablet Take 1 tablet (300 mg total) by mouth daily.  Marland Kitchen amLODipine (NORVASC) 10 MG tablet Take 1  tablet (10 mg total) by mouth daily.  Marland Kitchen aspirin EC 81 MG tablet Take 81 mg by mouth daily.  . Colchicine 0.6 MG CAPS TAKE 2 CAPSULES BY MOUTH NOW, THEN 1 CAPSULE DAILY  . LORazepam (ATIVAN) 1 MG tablet Take 0.5-1 tablets (0.5-1 mg total) by mouth daily as needed for anxiety.  Marland Kitchen losartan-hydrochlorothiazide (HYZAAR) 100-25 MG tablet Take 1 tablet by mouth daily.  . metoprolol tartrate (LOPRESSOR) 25 MG tablet Take 1 tablet (25 mg total) by mouth 2 (two) times daily.  . Misc Natural Products (OSTEO BI-FLEX ADV DOUBLE ST PO) Take by mouth.  . pantoprazole (PROTONIX) 40 MG tablet Take 1 tablet (40 mg total) by mouth daily.  Marland Kitchen albuterol (PROVENTIL HFA;VENTOLIN HFA) 108 (90 Base) MCG/ACT inhaler Inhale 2 puffs into the lungs every 6 (six) hours as needed for wheezing or shortness of breath. (Patient not taking: Reported on 05/23/2018)  . azithromycin (ZITHROMAX) 250 MG tablet Take 2 tabs day one, then 1 tab daily until complete (Patient not taking: Reported on 05/23/2018)  . chlorpheniramine-HYDROcodone (TUSSIONEX PENNKINETIC ER) 10-8 MG/5ML SUER Take 5 mLs by mouth every 12 (twelve) hours as needed for cough. (Patient not taking: Reported on 05/23/2018)  . predniSONE (DELTASONE) 20 MG tablet Take 2 tablets (40 mg total) by mouth daily with breakfast. (Patient not taking: Reported on 05/23/2018)   Facility-Administered Encounter Medications as of 05/23/2018  Medication  . albuterol (PROVENTIL) (2.5 MG/3ML) 0.083% nebulizer solution 2.5 mg    Activities of Daily Living In your present state of health, do you have any difficulty performing the following activities: 05/23/2018  Hearing? N  Comment ringing in ears  Vision? N  Difficulty concentrating or making decisions? Y  Walking or climbing stairs? N  Dressing or bathing? N  Doing errands, shopping? N  Preparing Food and eating ? N  Using the Toilet? N  In the past six months, have you accidently leaked urine? N  Do you have problems with loss of  bowel control? N  Managing your Medications? N  Managing your Finances? N  Housekeeping or managing your Housekeeping? N  Some recent data might be hidden    Patient Care Team: [name], MD as PCP - General (Family Medicine) Lollie Sails, MD as Consulting Physician (Gastroenterology)   Assessment:   This is a routine wellness examination for Jesse Norton.  Exercise Activities and Dietary recommendations Current Exercise Habits: Home exercise routine(gym 4 days a week ), Type of exercise: walking;treadmill;strength training/weights(bicycle ), Time (Minutes): > 60, Frequency (Times/Week): 4, Weekly Exercise (Minutes/Week): 0, Intensity: Mild, Exercise limited by: None identified  Goals    . DIET - INCREASE WATER INTAKE     Recommend dirnking at least 6-8 glasses of water a day  Fall Risk Fall Risk  05/23/2018 12/25/2017 05/18/2017 01/09/2017 05/30/2016  Falls in the past year? No No No No No   Is the patient's home free of loose throw rugs in walkways, pet beds, electrical cords, etc?   yes      Grab bars in the bathroom? yes      Handrails on the stairs?   yes      Adequate lighting?   yes  Timed Get Up and Go Performed: Completed in 8 seconds with no use of assistive devices, steady gait. No intervention needed at this time.   Depression Screen PHQ 2/9 Scores 05/23/2018 12/25/2017 05/18/2017 05/30/2016  PHQ - 2 Score 1 0 0 0  PHQ- 9 Score 5 - 1 -    Cognitive Function     6CIT Screen 05/23/2018 05/18/2017  What Year? 0 points 0 points  What month? 0 points 0 points  What time? 0 points 0 points  Count back from 20 0 points 0 points  Months in reverse 0 points 0 points  Repeat phrase 0 points 0 points  Total Score 0 0    Immunization History  Administered Date(s) Administered  . Influenza, High Dose Seasonal PF 11/29/2016, 10/11/2017  . Influenza,inj,Quad PF,6+ Mos 11/22/2015  . Influenza-Unspecified 11/10/2014  . Pneumococcal Conjugate-13 05/12/2014  .  Pneumococcal Polysaccharide-23 04/11/2010  . Td 05/12/2014  . Zoster 10/08/2008    Qualifies for Shingles Vaccine? Yes, discussed shingrix vaccine  Screening Tests Health Maintenance  Topic Date Due  . COLONOSCOPY  04/09/2018  . INFLUENZA VACCINE  07/11/2018  . TETANUS/TDAP  05/12/2024  . Hepatitis C Screening  Completed  . PNA vac Low Risk Adult  Completed   Cancer Screenings: Lung: Low Dose CT Chest recommended if Age 17-80 years, 30 pack-year currently smoking OR have quit w/in 15years. Patient does not qualify. Colorectal: completed 04/09/2013  Additional Screenings:  Hepatitis C Screening: completed 11/22/2015      Plan:    I have personally reviewed and addressed the Medicare Annual Wellness questionnaire and have noted the following in the patient's chart:  A. Medical and social history B. Use of alcohol, tobacco or illicit drugs  C. Current medications and supplements D. Functional ability and status E.  Nutritional status F.  Physical activity G. Advance directives H. List of other physicians I.  Hospitalizations, surgeries, and ER visits in previous 12 months J.  [place] such as hearing and vision if needed, cognitive and depression L. Referrals and appointments   In addition, I have reviewed and discussed with patient certain preventive protocols, quality metrics, and best practice recommendations. A written personalized care plan for preventive services as well as general preventive health recommendations were provided to patient.   Signed,  Tyler Aas, LPN Nurse Health Advisor   Nurse Notes: pain in right wrist, started over a year ago and pain has increased since last speaking with PCP about it. Has difficulty with gripping, no numbness or tingling in hand or arm. No radiating pain, has difficulty with ROM. Has CPE on 06/26/2018 with Dr.Crissman.

## 2018-05-23 NOTE — Patient Instructions (Addendum)
Jesse Norton , Thank you for taking time to come for your Medicare Wellness Visit. I appreciate your ongoing commitment to your health goals. Please review the following plan we discussed and let me know if I can assist you in the future.   Screening recommendations/referrals: Colonoscopy: due now - referral placed to [facility] Recommended yearly ophthalmology/optometry visit for glaucoma screening and checkup Recommended yearly dental visit for hygiene and checkup  Vaccinations: Influenza vaccine: up to date, due 11/2017 Pneumococcal vaccine: completed series Tdap vaccine: up to date Shingles vaccine: shingrix eligible, check with your insurance company for coverage    Advanced directives: Please bring a copy of your health care of power attorney and living will at your convenience.   Conditions/risks identified:Recommend drinking at least 6-8 glasses of water a day  Next appointment: Follow up on 06/26/2018 at 8:30am with Dr.Crissman. Follow up in one year for your annual wellness exam.    Preventive Care 65 Years and Older, Male Preventive care refers to lifestyle choices and visits with your health care provider that can promote health and wellness. What does preventive care include?  A yearly physical exam. This is also called an annual well check.  Dental exams once or twice a year.  Routine eye exams. Ask your health care provider how often you should have your eyes checked.  Personal lifestyle choices, including:  Daily care of your teeth and gums.  Regular physical activity.  Eating a healthy diet.  Avoiding tobacco and drug use.  Limiting alcohol use.  Practicing safe sex.  Taking low doses of aspirin every day.  Taking vitamin and mineral supplements as recommended by your health care provider. What happens during an annual well check? The services and screenings done by your health care provider during your annual well check will depend on your age,  overall health, lifestyle risk factors, and family history of disease. Counseling  Your health care provider may ask you questions about your:  Alcohol use.  Tobacco use.  Drug use.  Emotional well-being.  Home and relationship well-being.  Sexual activity.  Eating habits.  History of falls.  Memory and ability to understand (cognition).  Work and work Statistician. Screening  You may have the following tests or measurements:  Height, weight, and BMI.  Blood pressure.  Lipid and cholesterol levels. These may be checked every 5 years, or more frequently if you are over 72 years old.  Skin check.  Lung cancer screening. You may have this screening every year starting at age 47 if you have a 30-pack-year history of smoking and currently smoke or have quit within the past 15 years.  Fecal occult blood test (FOBT) of the stool. You may have this test every year starting at age 105.  Flexible sigmoidoscopy or colonoscopy. You may have a sigmoidoscopy every 5 years or a colonoscopy every 10 years starting at age 25.  Prostate cancer screening. Recommendations will vary depending on your family history and other risks.  Hepatitis C blood test.  Hepatitis B blood test.  Sexually transmitted disease (STD) testing.  Diabetes screening. This is done by checking your blood sugar (glucose) after you have not eaten for a while (fasting). You may have this done every 1-3 years.  Abdominal aortic aneurysm (AAA) screening. You may need this if you are a current or former smoker.  Osteoporosis. You may be screened starting at age 21 if you are at high risk. Talk with your health care provider about your test results, treatment  options, and if necessary, the need for more tests. Vaccines  Your health care provider may recommend certain vaccines, such as:  Influenza vaccine. This is recommended every year.  Tetanus, diphtheria, and acellular pertussis (Tdap, Td) vaccine. You may  need a Td booster every 10 years.  Zoster vaccine. You may need this after age 37.  Pneumococcal 13-valent conjugate (PCV13) vaccine. One dose is recommended after age 69.  Pneumococcal polysaccharide (PPSV23) vaccine. One dose is recommended after age 28. Talk to your health care provider about which screenings and vaccines you need and how often you need them. This information is not intended to replace advice given to you by your health care provider. Make sure you discuss any questions you have with your health care provider. Document Released: 12/24/2015 Document Revised: 08/16/2016 Document Reviewed: 09/28/2015 Elsevier Interactive Patient Education  2017 [employer] Prevention in the Home Falls can cause injuries. They can happen to people of all ages. There are many things you can do to make your home safe and to help prevent falls. What can I do on the outside of my home?  Regularly fix the edges of walkways and driveways and fix any cracks.  Remove anything that might make you trip as you walk through a door, such as a raised step or threshold.  Trim any bushes or trees on the path to your home.  Use bright outdoor lighting.  Clear any walking paths of anything that might make someone trip, such as rocks or tools.  Regularly check to see if handrails are loose or broken. Make sure that both sides of any steps have handrails.  Any raised decks and porches should have guardrails on the edges.  Have any leaves, snow, or ice cleared regularly.  Use sand or salt on walking paths during winter.  Clean up any spills in your garage right away. This includes oil or grease spills. What can I do in the bathroom?  Use night lights.  Install grab bars by the toilet and in the tub and shower. Do not use towel bars as grab bars.  Use non-skid mats or decals in the tub or shower.  If you need to sit down in the shower, use a plastic, non-slip stool.  Keep the floor  dry. Clean up any water that spills on the floor as soon as it happens.  Remove soap buildup in the tub or shower regularly.  Attach bath mats securely with double-sided non-slip rug tape.  Do not have throw rugs and other things on the floor that can make you trip. What can I do in the bedroom?  Use night lights.  Make sure that you have a light by your bed that is easy to reach.  Do not use any sheets or blankets that are too big for your bed. They should not hang down onto the floor.  Have a firm chair that has side arms. You can use this for support while you get dressed.  Do not have throw rugs and other things on the floor that can make you trip. What can I do in the kitchen?  Clean up any spills right away.  Avoid walking on wet floors.  Keep items that you use a lot in easy-to-reach places.  If you need to reach something above you, use a strong step stool that has a grab bar.  Keep electrical cords out of the way.  Do not use floor polish or wax that makes floors slippery.  If you must use wax, use non-skid floor wax.  Do not have throw rugs and other things on the floor that can make you trip. What can I do with my stairs?  Do not leave any items on the stairs.  Make sure that there are handrails on both sides of the stairs and use them. Fix handrails that are broken or loose. Make sure that handrails are as long as the stairways.  Check any carpeting to make sure that it is firmly attached to the stairs. Fix any carpet that is loose or worn.  Avoid having throw rugs at the top or bottom of the stairs. If you do have throw rugs, attach them to the floor with carpet tape.  Make sure that you have a light switch at the top of the stairs and the bottom of the stairs. If you do not have them, ask someone to add them for you. What else can I do to help prevent falls?  Wear shoes that:  Do not have high heels.  Have rubber bottoms.  Are comfortable and fit you  well.  Are closed at the toe. Do not wear sandals.  If you use a stepladder:  Make sure that it is fully opened. Do not climb a closed stepladder.  Make sure that both sides of the stepladder are locked into place.  Ask someone to hold it for you, if possible.  Clearly mark and make sure that you can see:  Any grab bars or handrails.  First and last steps.  Where the edge of each step is.  Use tools that help you move around (mobility aids) if they are needed. These include:  Canes.  Walkers.  Scooters.  Crutches.  Turn on the lights when you go into a dark area. Replace any light bulbs as soon as they burn out.  Set up your furniture so you have a clear path. Avoid moving your furniture around.  If any of your floors are uneven, fix them.  If there are any pets around you, be aware of where they are.  Review your medicines with your doctor. Some medicines can make you feel dizzy. This can increase your chance of falling. Ask your doctor what other things that you can do to help prevent falls. This information is not intended to replace advice given to you by your health care provider. Make sure you discuss any questions you have with your health care provider. Document Released: 09/23/2009 Document Revised: 05/04/2016 Document Reviewed: 01/01/2015 Elsevier Interactive Patient Education  2017 [employer].

## 2018-06-07 ENCOUNTER — Other Ambulatory Visit: Payer: Self-pay | Admitting: Family Medicine

## 2018-06-07 DIAGNOSIS — K22719 Barrett's esophagus with dysplasia, unspecified: Secondary | ICD-10-CM

## 2018-06-21 DIAGNOSIS — Z86010 Personal history of colonic polyps: Secondary | ICD-10-CM | POA: Diagnosis not present

## 2018-06-26 ENCOUNTER — Ambulatory Visit (INDEPENDENT_AMBULATORY_CARE_PROVIDER_SITE_OTHER): Payer: Medicare Other | Admitting: Family Medicine

## 2018-06-26 ENCOUNTER — Encounter: Payer: Self-pay | Admitting: Family Medicine

## 2018-06-26 VITALS — BP 132/80 | HR 73 | Ht 70.0 in | Wt 262.0 lb

## 2018-06-26 DIAGNOSIS — R319 Hematuria, unspecified: Secondary | ICD-10-CM | POA: Insufficient documentation

## 2018-06-26 DIAGNOSIS — Z7189 Other specified counseling: Secondary | ICD-10-CM

## 2018-06-26 DIAGNOSIS — Z125 Encounter for screening for malignant neoplasm of prostate: Secondary | ICD-10-CM | POA: Diagnosis not present

## 2018-06-26 DIAGNOSIS — M10072 Idiopathic gout, left ankle and foot: Secondary | ICD-10-CM | POA: Diagnosis not present

## 2018-06-26 DIAGNOSIS — R3121 Asymptomatic microscopic hematuria: Secondary | ICD-10-CM | POA: Diagnosis not present

## 2018-06-26 DIAGNOSIS — M25531 Pain in right wrist: Secondary | ICD-10-CM

## 2018-06-26 DIAGNOSIS — Z1329 Encounter for screening for other suspected endocrine disorder: Secondary | ICD-10-CM | POA: Diagnosis not present

## 2018-06-26 DIAGNOSIS — N400 Enlarged prostate without lower urinary tract symptoms: Secondary | ICD-10-CM

## 2018-06-26 DIAGNOSIS — I10 Essential (primary) hypertension: Secondary | ICD-10-CM

## 2018-06-26 DIAGNOSIS — K22719 Barrett's esophagus with dysplasia, unspecified: Secondary | ICD-10-CM

## 2018-06-26 DIAGNOSIS — F419 Anxiety disorder, unspecified: Secondary | ICD-10-CM

## 2018-06-26 LAB — URINALYSIS, ROUTINE W REFLEX MICROSCOPIC
Bilirubin, UA: NEGATIVE
Glucose, UA: NEGATIVE
KETONES UA: NEGATIVE
LEUKOCYTES UA: NEGATIVE
NITRITE UA: NEGATIVE
PH UA: 5.5 (ref 5.0–7.5)
Protein, UA: NEGATIVE
SPEC GRAV UA: 1.02 (ref 1.005–1.030)
Urobilinogen, Ur: 1 mg/dL (ref 0.2–1.0)

## 2018-06-26 LAB — MICROSCOPIC EXAMINATION

## 2018-06-26 MED ORDER — MELOXICAM 15 MG PO TABS: 15.0000 mg | ORAL_TABLET | Freq: Every day | ORAL | 3 refills | Status: DC

## 2018-06-26 MED ORDER — PANTOPRAZOLE SODIUM 20 MG PO TBEC: 20.0000 mg | DELAYED_RELEASE_TABLET | Freq: Two times a day (BID) | ORAL | 4 refills | Status: DC

## 2018-06-26 MED ORDER — FEBUXOSTAT 40 MG PO TABS: 40.0000 mg | ORAL_TABLET | Freq: Every day | ORAL | 4 refills | Status: DC

## 2018-06-26 MED ORDER — AMLODIPINE BESYLATE 10 MG PO TABS: 10.0000 mg | ORAL_TABLET | Freq: Every day | ORAL | 4 refills | Status: DC

## 2018-06-26 MED ORDER — LOSARTAN POTASSIUM-HCTZ 100-25 MG PO TABS: 1.0000 | ORAL_TABLET | Freq: Every day | ORAL | 4 refills | Status: DC

## 2018-06-26 MED ORDER — LORAZEPAM 1 MG PO TABS: 0.5000 mg | ORAL_TABLET | Freq: Every day | ORAL | 1 refills | Status: DC | PRN

## 2018-06-26 MED ORDER — METOPROLOL TARTRATE 25 MG PO TABS: 25.0000 mg | ORAL_TABLET | Freq: Two times a day (BID) | ORAL | 4 refills | Status: DC

## 2018-06-26 NOTE — Assessment & Plan Note (Signed)
The current medical regimen is effective;  continue present plan and medications.  

## 2018-06-26 NOTE — Assessment & Plan Note (Signed)
A voluntary discussion about advanced care planning including explanation and discussion of advanced directives was extentively discussed with the patient.  Explained about the healthcare proxy and living will was reviewed and packet with forms with expiration of how to fill them out was given.  Time spent: Encounter 16+ min individuals present: Patient 

## 2018-06-26 NOTE — Progress Notes (Signed)
BP 132/80 (BP Location: Left Arm)   Pulse 73   Ht 5\' 10"  (1.778 m)   Wt 262 lb (118.8 kg)   SpO2 95%   BMI 37.59 kg/m    Subjective:    Patient ID: Jesse Norton, male    DOB: 07/22/1946, 72 y.o.   MRN: [id number]  HPI: Jesse Norton is a 72 y.o. male  Chief Complaint  Patient presents with  . Yearly  Annual exam Patient also follow-up hypertension doing well with blood pressure no complaints. Reflux doing stable GI wants to change to 20 mg twice daily for Barrett's. Ongoing stress anxiety we will continue lorazepam on a as needed basis. Right wrist sore tender with some decreased strength. Gout having some side effects some neck soreness tightness with taking medication wants to consider trying something else.  Has tried stopping allopurinol and his left foot flares up again with gout symptoms.   Relevant past medical, surgical, family and social history reviewed and updated as indicated. Interim medical history since our last visit reviewed. Allergies and medications reviewed and updated.  Review of Systems  Constitutional: Negative.   HENT: Negative.   Eyes: Negative.   Respiratory: Negative.   Cardiovascular: Negative.   Gastrointestinal: Negative.   Endocrine: Negative.   Genitourinary: Negative.   Musculoskeletal: Negative.   Skin: Negative.   Allergic/Immunologic: Negative.   Neurological: Negative.   Hematological: Negative.   Psychiatric/Behavioral: Negative.      Per HPI unless specifically indicated above     Objective:    BP 132/80 (BP Location: Left Arm)   Pulse 73   Ht 5\' 10"  (1.778 m)   Wt 262 lb (118.8 kg)   SpO2 95%   BMI 37.59 kg/m   Wt Readings from Last 3 Encounters:  06/26/18 262 lb (118.8 kg)  05/23/18 270 lb 8 oz (122.7 kg)  02/20/18 259 lb 9.6 oz (117.8 kg)    Physical Exam  Constitutional: He is oriented to person, place, and time. He appears well-developed and well-nourished.  HENT:  Head: Normocephalic and  atraumatic.  Right Ear: External ear normal.  Left Ear: External ear normal.  Eyes: Pupils are equal, round, and reactive to light. Conjunctivae and EOM are normal.  Neck: Normal range of motion. Neck supple.  Cardiovascular: Normal rate, regular rhythm, normal heart sounds and intact distal pulses.  Pulmonary/Chest: Effort normal and breath sounds normal.  Abdominal: Soft. Bowel sounds are normal. There is no splenomegaly or hepatomegaly.  Genitourinary: Rectum normal, prostate normal and penis normal.  Musculoskeletal: Normal range of motion.  Neurological: He is alert and oriented to person, place, and time. He has normal reflexes.  Skin: No rash noted. No erythema.  Psychiatric: He has a normal mood and affect. His behavior is normal. Judgment and thought content normal.    Results for orders placed or performed in visit on 53/29/92  Basic metabolic panel  Result Value Ref Range   Glucose 127 (H) 65 - 99 mg/dL   BUN 13 8 - 27 mg/dL   Creatinine, Ser 1.07 0.76 - 1.27 mg/dL   GFR calc non Af Amer 69 >59 mL/min/1.73   GFR calc Af Amer 80 >59 mL/min/1.73   BUN/Creatinine Ratio 12 10 - 24   Sodium 142 134 - 144 mmol/L   Potassium 4.3 3.5 - 5.2 mmol/L   Chloride 99 96 - 106 mmol/L   CO2 26 20 - 29 mmol/L   Calcium 9.6 8.6 - 10.2 mg/dL  Assessment & Plan:   Problem List Items Addressed This Visit      Cardiovascular and Mediastinum   Essential hypertension - Primary    The current medical regimen is effective;  continue present plan and medications.       Relevant Medications   metoprolol tartrate (LOPRESSOR) 25 MG tablet   losartan-hydrochlorothiazide (HYZAAR) 100-25 MG tablet   amLODipine (NORVASC) 10 MG tablet   Other Relevant Orders   CBC with Differential/Platelet   Comprehensive metabolic panel   Lipid panel   Urinalysis, Routine w reflex microscopic     Digestive   Barrett's esophagus    At recommendation of GI will switch to Protonix 20 mg twice daily        Relevant Medications   pantoprazole (PROTONIX) 20 MG tablet     Genitourinary   BPH (benign prostatic hyperplasia)    stable      Relevant Orders   PSA     Other   Acute anxiety    Discuss ongoing anxiety and difficulties especially with son.  We will continue lorazepam on as needed usage      Relevant Medications   LORazepam (ATIVAN) 1 MG tablet   Gout    Developing intolerance to allopurinol with side effects will switch to Uloric      Relevant Orders   Uric acid   Arthralgia of right wrist    Discussed wrist arthritis will observe for now give meloxicam and if no improvement will consider orthopedic referral.      Hematuria    For microscopic hematuria will refer to urology to further evaluate patient asymptomatic      Relevant Orders   Ambulatory referral to Urology   Advanced care planning/counseling discussion    A voluntary discussion about advanced care planning including explanation and discussion of advanced directives was extentively discussed with the patient.  Explained about the healthcare proxy and living will was reviewed and packet with forms with expiration of how to fill them out was given.  Time spent: Encounter 16+ min individuals present: Patient       Other Visit Diagnoses    Prostate cancer screening       Relevant Orders   PSA   Thyroid disorder screen       Relevant Orders   TSH       Follow up plan: Return in about 6 months (around 12/27/2018).

## 2018-06-26 NOTE — Assessment & Plan Note (Signed)
At recommendation of GI will switch to Protonix 20 mg twice daily

## 2018-06-26 NOTE — Assessment & Plan Note (Signed)
stable °

## 2018-06-26 NOTE — Assessment & Plan Note (Signed)
Discuss ongoing anxiety and difficulties especially with son.  We will continue lorazepam on as needed usage

## 2018-06-26 NOTE — Assessment & Plan Note (Signed)
Developing intolerance to allopurinol with side effects will switch to [employer]

## 2018-06-26 NOTE — Assessment & Plan Note (Signed)
For microscopic hematuria will refer to urology to further evaluate patient asymptomatic

## 2018-06-26 NOTE — Assessment & Plan Note (Signed)
Discussed wrist arthritis will observe for now give meloxicam and if no improvement will consider orthopedic referral.

## 2018-06-27 ENCOUNTER — Encounter: Payer: Self-pay | Admitting: Family Medicine

## 2018-06-27 LAB — CBC WITH DIFFERENTIAL/PLATELET
BASOS: 1 %
Basophils Absolute: 0.1 10*3/uL (ref 0.0–0.2)
EOS (ABSOLUTE): 0.1 10*3/uL (ref 0.0–0.4)
Eos: 2 %
Hematocrit: 42.9 % (ref 37.5–51.0)
Hemoglobin: 14.3 g/dL (ref 13.0–17.7)
IMMATURE GRANS (ABS): 0 10*3/uL (ref 0.0–0.1)
IMMATURE GRANULOCYTES: 0 %
LYMPHS: 46 %
Lymphocytes Absolute: 2.9 10*3/uL (ref 0.7–3.1)
MCH: 33.3 pg — ABNORMAL HIGH (ref 26.6–33.0)
MCHC: 33.3 g/dL (ref 31.5–35.7)
MCV: 100 fL — AB (ref 79–97)
MONOS ABS: 0.7 10*3/uL (ref 0.1–0.9)
Monocytes: 12 %
NEUTROS PCT: 39 %
Neutrophils Absolute: 2.4 10*3/uL (ref 1.4–7.0)
PLATELETS: 289 10*3/uL (ref 150–450)
RBC: 4.29 x10E6/uL (ref 4.14–5.80)
RDW: 14.4 % (ref 12.3–15.4)
WBC: 6.2 10*3/uL (ref 3.4–10.8)

## 2018-06-27 LAB — COMPREHENSIVE METABOLIC PANEL
ALK PHOS: 58 IU/L (ref 39–117)
ALT: 41 IU/L (ref 0–44)
AST: 37 IU/L (ref 0–40)
Albumin/Globulin Ratio: 1.9 (ref 1.2–2.2)
Albumin: 4.3 g/dL (ref 3.5–4.8)
BUN/Creatinine Ratio: 15 (ref 10–24)
BUN: 16 mg/dL (ref 8–27)
Bilirubin Total: 0.7 mg/dL (ref 0.0–1.2)
CALCIUM: 9.9 mg/dL (ref 8.6–10.2)
CO2: 28 mmol/L (ref 20–29)
CREATININE: 1.09 mg/dL (ref 0.76–1.27)
Chloride: 99 mmol/L (ref 96–106)
GFR calc Af Amer: 77 mL/min/{1.73_m2} (ref >59)
GFR, EST NON AFRICAN AMERICAN: 67 mL/min/{1.73_m2} (ref >59)
GLUCOSE: 117 mg/dL — AB (ref 65–99)
Globulin, Total: 2.3 g/dL (ref 1.5–4.5)
Potassium: 3.8 mmol/L (ref 3.5–5.2)
Sodium: 142 mmol/L (ref 134–144)
Total Protein: 6.6 g/dL (ref 6.0–8.5)

## 2018-06-27 LAB — LIPID PANEL
CHOL/HDL RATIO: 2.9 ratio (ref 0.0–5.0)
Cholesterol, Total: 169 mg/dL (ref 100–199)
HDL: 59 mg/dL (ref >39)
LDL CALC: 98 mg/dL (ref 0–99)
TRIGLYCERIDES: 62 mg/dL (ref 0–149)
VLDL CHOLESTEROL CAL: 12 mg/dL (ref 5–40)

## 2018-06-27 LAB — PSA: Prostate Specific Ag, Serum: 2.2 ng/mL (ref 0.0–4.0)

## 2018-06-27 LAB — URIC ACID: Uric Acid: 5.3 mg/dL (ref 3.7–8.6)

## 2018-06-27 LAB — TSH: TSH: 1.24 u[IU]/mL (ref 0.450–4.500)

## 2018-07-03 ENCOUNTER — Emergency Department
Admission: EM | Admit: 2018-07-03 | Discharge: 2018-07-03 | Disposition: A | Discharge status: Home / Self Care | Payer: Medicare Other | Attending: Emergency Medicine | Admitting: Emergency Medicine

## 2018-07-03 ENCOUNTER — Emergency Department: Payer: Medicare Other

## 2018-07-03 ENCOUNTER — Encounter: Payer: Self-pay | Admitting: Emergency Medicine

## 2018-07-03 ENCOUNTER — Other Ambulatory Visit: Payer: Self-pay

## 2018-07-03 DIAGNOSIS — R110 Nausea: Secondary | ICD-10-CM | POA: Insufficient documentation

## 2018-07-03 DIAGNOSIS — Z7982 Long term (current) use of aspirin: Secondary | ICD-10-CM | POA: Insufficient documentation

## 2018-07-03 DIAGNOSIS — I10 Essential (primary) hypertension: Secondary | ICD-10-CM | POA: Diagnosis not present

## 2018-07-03 DIAGNOSIS — R079 Chest pain, unspecified: Secondary | ICD-10-CM | POA: Diagnosis not present

## 2018-07-03 DIAGNOSIS — Z79899 Other long term (current) drug therapy: Secondary | ICD-10-CM | POA: Insufficient documentation

## 2018-07-03 DIAGNOSIS — R0789 Other chest pain: Secondary | ICD-10-CM | POA: Diagnosis not present

## 2018-07-03 LAB — CBC
HCT: 42.4 % (ref 40.0–52.0)
Hemoglobin: 15 g/dL (ref 13.0–18.0)
MCH: 35.1 pg — ABNORMAL HIGH (ref 26.0–34.0)
MCHC: 35.4 g/dL (ref 32.0–36.0)
MCV: 99 fL (ref 80.0–100.0)
Platelets: 291 10*3/uL (ref 150–440)
RBC: 4.28 MIL/uL — AB (ref 4.40–5.90)
RDW: 13.6 % (ref 11.5–14.5)
WBC: 8.2 10*3/uL (ref 3.8–10.6)

## 2018-07-03 LAB — BASIC METABOLIC PANEL
ANION GAP: 9 (ref 5–15)
BUN: 19 mg/dL (ref 8–23)
CALCIUM: 9.3 mg/dL (ref 8.9–10.3)
CO2: 30 mmol/L (ref 22–32)
Chloride: 98 mmol/L (ref 98–111)
Creatinine, Ser: 1.16 mg/dL (ref 0.61–1.24)
Glucose, Bld: 125 mg/dL — ABNORMAL HIGH (ref 70–99)
Potassium: 3.5 mmol/L (ref 3.5–5.1)
Sodium: 137 mmol/L (ref 135–145)

## 2018-07-03 LAB — TROPONIN I: Troponin I: <0.03 ng/mL (ref <0.03)

## 2018-07-03 NOTE — ED Triage Notes (Signed)
Pt to ED c/o chest pressure that started while at the gym between 8-9am radiating to left arm, states can't take deep breath, nausea denies vomiting.

## 2018-07-03 NOTE — ED Provider Notes (Signed)
[facility] Emergency Department Provider Note ____________________________________________   First MD Initiated Contact with Patient 07/03/18 1639     (approximate)  I have reviewed the triage vital signs and the nursing notes.   HISTORY  Chief Complaint Chest Pain    HPI Jesse Norton is a 72 y.o. male with PMH as noted below who presents with chest pain described as relatively mild discomfort, mainly in the left side of his chest and somewhat radiating to the arm.  It started after he woke this morning and continued when he went to the gym, but he states that it was not exertional.  He reported some associated nausea at this morning, but no shortness of breath or lightheadedness.  He did feel like he could not take a full breath.  He states that the symptoms have completely resolved at this time and he feels back to his baseline.  He does report anxiety and recently feeling overwhelmed due to some life stressors, and thinks that the anxiety may have resulted in his symptoms as he was feeling anxious this morning.  He denies any leg pain or swelling, cough, fever, or weakness.  Past Medical History:  Diagnosis Date  . Anxiety   . Barrett's esophagus   . GERD (gastroesophageal reflux disease)   . Hypertension   . Stomach ulcer     Patient Active Problem List   Diagnosis Date Noted  . BPH (benign prostatic hyperplasia) 06/26/2018  . Arthralgia of right wrist 06/26/2018  . Hematuria 06/26/2018  . Advanced care planning/counseling discussion 06/26/2018  . Gout 03/21/2016  . Essential hypertension 05/25/2015  . Barrett's esophagus 05/25/2015  . Acute anxiety 05/25/2015    Past Surgical History:  Procedure Laterality Date  . ESOPHAGOGASTRODUODENOSCOPY N/A 01/29/2017   Procedure: ESOPHAGOGASTRODUODENOSCOPY (EGD);  Surgeon: Lollie Sails, MD;  Location: [facility] ENDOSCOPY;  Service: Endoscopy;  Laterality: N/A;  . ESOPHAGOGASTRODUODENOSCOPY (EGD) WITH  PROPOFOL N/A 07/10/2016   Procedure: ESOPHAGOGASTRODUODENOSCOPY (EGD) WITH PROPOFOL;  Surgeon: Lollie Sails, MD;  Location: [facility] ENDOSCOPY;  Service: Endoscopy;  Laterality: N/A;  . left elbow repair Left   . SPLENECTOMY  1957  . TONSILLECTOMY      Prior to Admission medications   Medication Sig Start Date End Date Taking? Authorizing Provider  albuterol (PROVENTIL HFA;VENTOLIN HFA) 108 (90 Base) MCG/ACT inhaler Inhale 2 puffs into the lungs every 6 (six) hours as needed for wheezing or shortness of breath. 02/20/18   Volney American, PA-C  amLODipine (NORVASC) 10 MG tablet Take 1 tablet (10 mg total) by mouth daily. 06/26/18   [name], MD  aspirin EC 81 MG tablet Take 81 mg by mouth daily.    [provider]  Colchicine 0.6 MG CAPS TAKE 2 CAPSULES BY MOUTH NOW, THEN 1 CAPSULE DAILY 06/19/17   [name], MD  febuxostat (ULORIC) 40 MG tablet Take 1 tablet (40 mg total) by mouth daily. 06/26/18   [name], MD  LORazepam (ATIVAN) 1 MG tablet Take 0.5-1 tablets (0.5-1 mg total) by mouth daily as needed for anxiety. 06/26/18   [name], MD  losartan-hydrochlorothiazide (HYZAAR) 100-25 MG tablet Take 1 tablet by mouth daily. 06/26/18   [name], MD  meloxicam (MOBIC) 15 MG tablet Take 1 tablet (15 mg total) by mouth daily. 06/26/18   [name], MD  metoprolol tartrate (LOPRESSOR) 25 MG tablet Take 1 tablet (25 mg total) by mouth 2 (two) times daily. 06/26/18  [name], MD  Misc Natural Products (OSTEO BI-FLEX ADV DOUBLE ST PO) Take by mouth.    [provider]  pantoprazole (PROTONIX) 20 MG tablet Take 1 tablet (20 mg total) by mouth 2 (two) times daily. 06/26/18   [name], MD    Allergies Patient has no known allergies.  Family History  Adopted: Yes  Family history unknown: Yes    Social History Social History   Tobacco Use  . Smoking status: Never Smoker  . Smokeless tobacco: Never Used  Substance Use  Topics  . Alcohol use: Not Currently    Alcohol/week: 3.0 oz    Types: 5 Glasses of wine per week  . Drug use: No    Review of Systems  Constitutional: No fever. Eyes: No redness ENT: No sore throat. Cardiovascular: Positive for resolved chest pain. Respiratory: Denies shortness of breath. Gastrointestinal: No vomiting. Genitourinary: Negative for flank pain.  Musculoskeletal: Negative for back pain. Skin: Negative for rash. Neurological: Negative for headache.   ____________________________________________   PHYSICAL EXAM:  VITAL SIGNS: ED Triage Vitals [07/03/18 1346]  Enc Vitals Group     BP (!) 158/82     Pulse Rate 93     Resp 18     Temp 97.8 F (36.6 C)     Temp Source Oral     SpO2 96 %     Weight 245 lb (111.1 kg)     Height 5\' 11"  (1.803 m)     Head Circumference      Peak Flow      Pain Score 2     Pain Loc      Pain Edu?      Excl. in [place]?     Constitutional: Alert and oriented. Well appearing and in no acute distress. Eyes: Conjunctivae are normal.  Head: Atraumatic. Nose: No congestion/rhinnorhea. Mouth/Throat: Mucous membranes are moist.   Neck: Normal range of motion.  Cardiovascular: Normal rate, regular rhythm. Grossly normal heart sounds.  Good peripheral circulation. Respiratory: Normal respiratory effort.  No retractions. Lungs CTAB. Gastrointestinal: Soft and nontender. No distention.  Genitourinary: No flank tenderness. Musculoskeletal: No lower extremity edema.  Extremities warm and well perfused.  No calf or popliteal swelling or tenderness. Neurologic:  Normal speech and language. No gross focal neurologic deficits are appreciated.  Skin:  Skin is warm and dry. No rash noted. Psychiatric: Mood and affect are normal. Speech and behavior are normal.  ____________________________________________   LABS (all labs ordered are listed, but only abnormal results are displayed)  Labs Reviewed  BASIC METABOLIC PANEL - Abnormal; Notable  for the following components:      Result Value   Glucose, Bld 125 (*)    All other components within normal limits  CBC - Abnormal; Notable for the following components:   RBC 4.28 (*)    MCH 35.1 (*)    All other components within normal limits  TROPONIN I  TROPONIN I  B. BURGDORFI ANTIBODIES   ____________________________________________  EKG  ED ECG REPORT I, Arta Silence, the attending physician, personally viewed and interpreted this ECG.  Date: 07/03/2018 EKG Time: 1344 Rate: 91 Rhythm: normal sinus rhythm QRS Axis: normal Intervals: normal ST/T Wave abnormalities: Nonspecific ST flattening in anterior leads Narrative Interpretation: no evidence of acute ischemia; no significant change when compared to EKG of 02/09/2016  ____________________________________________  RADIOLOGY  CXR: No focal infiltrate or other acute findings  ____________________________________________   PROCEDURES  Procedure(s) performed: No  Procedures  Critical Care performed: No ____________________________________________   INITIAL IMPRESSION / ASSESSMENT AND PLAN / ED COURSE  Pertinent labs & imaging results that were available during my care of the patient were reviewed by me and considered in my medical decision making (see chart for details).  72 year old male with PMH as noted above presents with atypical chest discomfort earlier today which was nonexertional, not associated with any significant symptoms other than anxiety, and is now resolved.  On exam, the patient is well-appearing, the vital signs are normal, and the remainder of the exam is as described above.  EKG shows nonspecific ST changes which are unchanged when compared to prior EKGs.  Overall I suspect most likely anxiety versus musculoskeletal pain or other benign etiology.  The patient's overall low risk for ACS.  Will obtain a second troponin to rule out.  Given the resolved symptoms and the normal vital  signs there is no evidence for PE, aortic dissection, or other concerning acute etiology.  Patient also reports a recent tick bite on his abdomen.  He was evaluated by his PMD after this, but states that Lyme test was not sent.  He denies having a rash or other acute symptoms related to this.  I will obtain a Lyme antibody test although it will not result tonight, so I will instruct him to have his doctor follow-up on it.  ----------------------------------------- 6:55 PM on 07/03/2018 -----------------------------------------  Repeat troponin is negative.  The patient has remained asymptomatic, and feels well to go home.  He agrees to follow-up with his primary care doctor, and requested referral to his wife's cardiologist Dr. Saunders Revel.  Return precautions given, and he expresses understanding.  ____________________________________________   FINAL CLINICAL IMPRESSION(S) / ED DIAGNOSES  Final diagnoses:  Atypical chest pain      NEW MEDICATIONS STARTED DURING THIS VISIT:  New Prescriptions   No medications on file     Note:  This document was prepared using Dragon voice recognition software and may include unintentional dictation errors.   Arta Silence, MD 07/03/18 [phone]

## 2018-07-03 NOTE — Discharge Instructions (Addendum)
Return to the ER for new, worsening, persistent severe chest pain, difficulty breathing, weakness or lightheadedness, or any other new or worsening symptoms that concern you.

## 2018-07-04 LAB — B. BURGDORFI ANTIBODIES: B burgdorferi Ab IgG+IgM: <0.91 {ISR} (ref 0.00–0.90)

## 2018-07-17 NOTE — Progress Notes (Signed)
07/18/2018 1:58 PM   Jesse Norton 07-03-1946 [id number]  Referring provider: [name], MD [address] [place], [place] [address]  Chief Complaint  Patient presents with  . New Patient (Initial Visit)     Asymptomatic microscopic hematuria pt denied pain/fever.     HPI: Patient is a 72 -year-old Caucasian male who presents today as a referral from Dr. Golden Pop for microscopic hematuria.   Patient was found to have micrscopic hematuria on 06/26/2018 with 3-10 RBC's/hpf.  He had an episode of hematuria 15 years ago and was evaluated by urology and was told he had a small stone.  He does not have a prior history of recurrent urinary tract infections, nephrolithiasis, trauma to the genitourinary tract, BPH or malignancies of the genitourinary tract.    His son has stones.  He does not have a family medical history of malignancies of the genitourinary tract or hematuria.   Today, he is having symptoms of nocturia x 1.  Patient denies any gross hematuria, dysuria or suprapubic/flank pain.  Patient denies any fevers, chills, nausea or vomiting.  His UA today demonstrates 11-30 RBC's  He is not a smoker.   He was exposed to second hand smoke growing up.  He has not worked with [employer], trichloroethylene, etc.   He has HTN.   He has a high BMI.     PMH: Past Medical History:  Diagnosis Date  . Anxiety   . Barrett's esophagus   . GERD (gastroesophageal reflux disease)   . Gout   . Hypertension   . Stomach ulcer     Surgical History: Past Surgical History:  Procedure Laterality Date  . ESOPHAGOGASTRODUODENOSCOPY N/A 01/29/2017   Procedure: ESOPHAGOGASTRODUODENOSCOPY (EGD);  Surgeon: Lollie Sails, MD;  Location: [facility] ENDOSCOPY;  Service: Endoscopy;  Laterality: N/A;  . ESOPHAGOGASTRODUODENOSCOPY (EGD) WITH PROPOFOL N/A 07/10/2016   Procedure: ESOPHAGOGASTRODUODENOSCOPY (EGD) WITH PROPOFOL;  Surgeon: Lollie Sails, MD;  Location: [facility] ENDOSCOPY;   Service: Endoscopy;  Laterality: N/A;  . left elbow repair Left   . SPLENECTOMY  1957  . TONSILLECTOMY      Home Medications:  Allergies as of 07/18/2018   No Known Allergies     Medication List        Accurate as of 07/18/18  1:58 PM. Always use your most recent med list.          amLODipine 10 MG tablet Commonly known as:  NORVASC Take 1 tablet (10 mg total) by mouth daily.   aspirin EC 81 MG tablet Take 81 mg by mouth daily.   Colchicine 0.6 MG Caps TAKE 2 CAPSULES BY MOUTH NOW, THEN 1 CAPSULE DAILY   febuxostat 40 MG tablet Commonly known as:  ULORIC Take 1 tablet (40 mg total) by mouth daily.   LORazepam 1 MG tablet Commonly known as:  ATIVAN Take 0.5-1 tablets (0.5-1 mg total) by mouth daily as needed for anxiety.   losartan-hydrochlorothiazide 100-25 MG tablet Commonly known as:  HYZAAR Take 1 tablet by mouth daily.   meloxicam 15 MG tablet Commonly known as:  MOBIC Take 1 tablet (15 mg total) by mouth daily.   metoprolol tartrate 25 MG tablet Commonly known as:  LOPRESSOR Take 1 tablet (25 mg total) by mouth 2 (two) times daily.   OSTEO BI-FLEX ADV DOUBLE ST PO Take by mouth.   pantoprazole 20 MG tablet Commonly known as:  PROTONIX Take 1 tablet (20 mg total) by mouth 2 (two) times  daily.       Allergies: No Known Allergies  Family History: Family History  Adopted: Yes  Family history unknown: Yes    Social History:  reports that he has never smoked. He has never used smokeless tobacco. He reports that he drank about 5.0 standard drinks of alcohol per week. He reports that he does not use drugs.  ROS: UROLOGY Frequent Urination?: No Hard to postpone urination?: No Burning/pain with urination?: No Get up at night to urinate?: Yes Leakage of urine?: No Urine stream starts and stops?: No Trouble starting stream?: No Do you have to strain to urinate?: No Blood in urine?: Yes Urinary tract infection?: No Sexually transmitted disease?:  No Injury to kidneys or bladder?: No Painful intercourse?: No Weak stream?: No Erection problems?: No Penile pain?: No  Gastrointestinal Nausea?: No Vomiting?: No Indigestion/heartburn?: No Diarrhea?: No Constipation?: No  Constitutional Fever: No Night sweats?: No Weight loss?: No Fatigue?: No  Skin Skin rash/lesions?: No Itching?: No  Eyes Blurred vision?: No Double vision?: No  Ears/Nose/Throat Sore throat?: No Sinus problems?: No  Hematologic/Lymphatic Swollen glands?: No Easy bruising?: No  Cardiovascular Leg swelling?: No Chest pain?: No  Respiratory Cough?: No Shortness of breath?: No  Endocrine Excessive thirst?: No  Musculoskeletal Back pain?: Yes Joint pain?: Yes  Neurological Headaches?: No Dizziness?: No  Psychologic Depression?: No Anxiety?: Yes  Physical Exam: BP (!) 154/96   Pulse 82   Ht 5\' 11"  (1.803 m)   Wt 265 lb (120.2 kg)   BMI 36.96 kg/m   Constitutional:  Well nourished. Alert and oriented, No acute distress. HEENT: [place] AT, moist mucus membranes.  Trachea midline, no masses. Cardiovascular: No clubbing, cyanosis, or edema. Respiratory: Normal respiratory effort, no increased work of breathing. GI: Abdomen is soft, non tender, non distended, no abdominal masses. Liver and spleen not palpable.  No hernias appreciated.  Stool sample for occult testing is not indicated.   GU: No CVA tenderness.  No bladder fullness or masses.  Patient with circumcised phallus.  Urethral meatus is patent.  No penile discharge. No penile lesions or rashes. Scrotum without lesions, cysts, rashes and/or edema.  Testicles are located scrotally bilaterally. No masses are appreciated in the testicles. Left and right epididymis are normal.  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 Anus and perineum without scarring or rashes. No rectal masses are appreciated. Prostate is approximately 50 grams, could only  palpated the apex and midportion of gland, no nodules are appreciated. Seminal vesicles are normal. Skin: No rashes, bruises or suspicious lesions. Lymph: No cervical or inguinal adenopathy. Neurologic: Grossly intact, no focal deficits, moving all 4 extremities. Psychiatric: Normal mood and affect.  Laboratory Data: Lab Results  Component Value Date   WBC 8.2 07/03/2018   HGB 15.0 07/03/2018   HCT 42.4 07/03/2018   MCV 99.0 07/03/2018   PLT 291 07/03/2018    Lab Results  Component Value Date   CREATININE 1.16 07/03/2018    No results found for: PSA  No results found for: TESTOSTERONE  No results found for: HGBA1C  Lab Results  Component Value Date   TSH 1.240 06/26/2018       Component Value Date/Time   CHOL 169 06/26/2018 1121   HDL 59 06/26/2018 1121   CHOLHDL 2.9 06/26/2018 1121   LDLCALC 98 06/26/2018 1121    Lab Results  Component Value Date   AST 37 06/26/2018   Lab Results  Component Value  Date   ALT 41 06/26/2018   No components found for: ALKALINEPHOPHATASE No components found for: BILIRUBINTOTAL  No results found for: ESTRADIOL   Urinalysis 11-30 RBC's.  See HPI and Epic.   I have reviewed the labs.  Assessment & Plan:    1. Microscopic hematuria Explained to the patient that there are a number of causes that can be associated with blood in the urine, such as stones, BPH, UTI's, damage to the urinary tract and/or cancer. At this time, I felt that the patient warranted further urologic evaluation.   The AUA guidelines state that a CT urogram is the preferred imaging study to evaluate hematuria. I explained to the patient that a contrast material will be injected into a vein and that in rare instances, an allergic reaction can result and may even life threatening   The patient denies any allergies to contrast, iodine and/or seafood and is not taking metformin. Following the imaging study,  I've recommended a cystoscopy. I described how this is  performed, typically in an office setting with a flexible cystoscope. We described the risks, benefits, and possible side effects, the most common of which is a minor amount of blood in the urine and/or burning which usually resolves in 24 to 48 hours.   The patient had the opportunity to ask questions which were answered. Based upon this discussion, the patient is willing to proceed. Therefore, I've ordered: a CT Urogram and cystoscopy.   - The patient will return following all of the above for discussion of the results.   - UA 11-30 RBC's  - Urine culture   - BUN + creatinine    2. Spermatocele  Scrotal ultrasound to confirm   Return for CT Urogram report and cystoscopy.  These notes generated with voice recognition software. I apologize for typographical errors.  Zara Council, PA-C  [facility] [address] [place]  [place], [place] [address] [phone]

## 2018-07-18 ENCOUNTER — Ambulatory Visit (INDEPENDENT_AMBULATORY_CARE_PROVIDER_SITE_OTHER): Payer: Medicare Other | Admitting: Urology

## 2018-07-18 ENCOUNTER — Encounter: Payer: Self-pay | Admitting: Urology

## 2018-07-18 VITALS — BP 154/96 | HR 82 | Ht 71.0 in | Wt 265.0 lb

## 2018-07-18 DIAGNOSIS — R3129 Other microscopic hematuria: Secondary | ICD-10-CM | POA: Diagnosis not present

## 2018-07-18 DIAGNOSIS — N4340 Spermatocele of epididymis, unspecified: Secondary | ICD-10-CM | POA: Diagnosis not present

## 2018-07-18 LAB — MICROSCOPIC EXAMINATION
EPITHELIAL CELLS (NON RENAL): NONE SEEN /HPF (ref 0–10)
WBC, UA: NONE SEEN /hpf (ref 0–5)

## 2018-07-18 LAB — URINALYSIS, COMPLETE
BILIRUBIN UA: NEGATIVE
Glucose, UA: NEGATIVE
KETONES UA: NEGATIVE
Leukocytes, UA: NEGATIVE
NITRITE UA: NEGATIVE
Protein, UA: NEGATIVE
SPEC GRAV UA: 1.025 (ref 1.005–1.030)
Urobilinogen, Ur: 0.2 mg/dL (ref 0.2–1.0)
pH, UA: 5.5 (ref 5.0–7.5)

## 2018-07-19 LAB — BUN+CREAT
BUN/Creatinine Ratio: 15 (ref 10–24)
BUN: 17 mg/dL (ref 8–27)
CREATININE: 1.13 mg/dL (ref 0.76–1.27)
GFR calc Af Amer: 74 mL/min/{1.73_m2} (ref >59)
GFR calc non Af Amer: 64 mL/min/{1.73_m2} (ref >59)

## 2018-07-20 LAB — URINE CULTURE

## 2018-08-05 ENCOUNTER — Ambulatory Visit
Admission: RE | Admit: 2018-08-05 | Discharge: 2018-08-05 | Disposition: A | Discharge status: Home / Self Care | Payer: Medicare Other | Source: Ambulatory Visit | Attending: Urology | Admitting: Urology

## 2018-08-05 DIAGNOSIS — N4341 Spermatocele of epididymis, single: Secondary | ICD-10-CM | POA: Insufficient documentation

## 2018-08-05 DIAGNOSIS — N4340 Spermatocele of epididymis, unspecified: Secondary | ICD-10-CM | POA: Diagnosis present

## 2018-08-05 DIAGNOSIS — Z9081 Acquired absence of spleen: Secondary | ICD-10-CM | POA: Diagnosis not present

## 2018-08-05 DIAGNOSIS — N433 Hydrocele, unspecified: Secondary | ICD-10-CM | POA: Diagnosis not present

## 2018-08-05 DIAGNOSIS — I700 Atherosclerosis of aorta: Secondary | ICD-10-CM | POA: Diagnosis not present

## 2018-08-05 DIAGNOSIS — R3129 Other microscopic hematuria: Secondary | ICD-10-CM | POA: Insufficient documentation

## 2018-08-05 MED ORDER — IOPAMIDOL (ISOVUE-300) INJECTION 61%
125.0000 mL | Freq: Once | INTRAVENOUS | Status: AC | PRN
  Administered 2018-08-05: 125 mL via INTRAVENOUS

## 2018-08-15 ENCOUNTER — Encounter: Payer: Self-pay | Admitting: Urology

## 2018-08-15 ENCOUNTER — Ambulatory Visit (INDEPENDENT_AMBULATORY_CARE_PROVIDER_SITE_OTHER): Payer: Medicare Other | Admitting: Urology

## 2018-08-15 ENCOUNTER — Other Ambulatory Visit: Payer: Self-pay | Admitting: Radiology

## 2018-08-15 VITALS — BP 169/99 | HR 91 | Ht 71.0 in | Wt 263.9 lb

## 2018-08-15 DIAGNOSIS — N35919 Unspecified urethral stricture, male, unspecified site: Secondary | ICD-10-CM

## 2018-08-15 DIAGNOSIS — R3129 Other microscopic hematuria: Secondary | ICD-10-CM

## 2018-08-15 LAB — MICROSCOPIC EXAMINATION
EPITHELIAL CELLS (NON RENAL): NONE SEEN /HPF (ref 0–10)
WBC UA: NONE SEEN /HPF (ref 0–5)

## 2018-08-15 LAB — URINALYSIS, COMPLETE
Bilirubin, UA: NEGATIVE
GLUCOSE, UA: NEGATIVE
KETONES UA: NEGATIVE
Nitrite, UA: NEGATIVE
Protein, UA: NEGATIVE
SPEC GRAV UA: 1.025 (ref 1.005–1.030)
Urobilinogen, Ur: 1 mg/dL (ref 0.2–1.0)
pH, UA: 6 (ref 5.0–7.5)

## 2018-08-15 MED ORDER — CIPROFLOXACIN IN D5W 400 MG/200ML IV SOLN
400.0000 mg | INTRAVENOUS | Status: AC
  Administered 2018-08-16: 400 mg via INTRAVENOUS

## 2018-08-15 MED ORDER — CIPROFLOXACIN HCL 500 MG PO TABS
500.0000 mg | ORAL_TABLET | Freq: Once | ORAL | Status: AC
  Administered 2018-08-15: 500 mg via ORAL

## 2018-08-15 MED ORDER — LIDOCAINE HCL URETHRAL/MUCOSAL 2 % EX GEL
1.0000 | Freq: Once | CUTANEOUS | Status: AC
Start: 2018-08-15 — End: 2018-08-15
  Administered 2018-08-15: 1 via URETHRAL

## 2018-08-15 NOTE — Progress Notes (Signed)
Cystoscopy Procedure Note:  Indication: Microscopic hematuria  After informed consent and discussion of the procedure and its risks, Jesse Norton was positioned and prepped in the standard fashion.  The flexible cystoscope was used to intubate the urethral meatus.  There was noted to be an approximately 5 French bulbar urethral stricture, this appeared short. Procedure aborted.  Imaging: CT urogram personally reviewed, no urolithiasis, hydronephrosis, or masses  Findings: 65F bulbourethral stricture  Assessment and Plan: Schedule for cystoscopy, urethral balloon dilation in OR   Nickolas Madrid, MD

## 2018-08-16 ENCOUNTER — Ambulatory Visit
Admission: RE | Admit: 2018-08-16 | Discharge: 2018-08-16 | Disposition: A | Discharge status: Home / Self Care | Payer: Medicare Other | Source: Ambulatory Visit | Attending: Urology | Admitting: Urology

## 2018-08-16 ENCOUNTER — Ambulatory Visit: Payer: Medicare Other | Admitting: Anesthesiology

## 2018-08-16 ENCOUNTER — Other Ambulatory Visit: Payer: Self-pay

## 2018-08-16 ENCOUNTER — Encounter
Admission: RE | Disposition: A | Discharge status: Home / Self Care | Payer: Self-pay | Source: Ambulatory Visit | Attending: Urology

## 2018-08-16 ENCOUNTER — Telehealth: Payer: Self-pay | Admitting: Urology

## 2018-08-16 DIAGNOSIS — N35919 Unspecified urethral stricture, male, unspecified site: Secondary | ICD-10-CM | POA: Diagnosis not present

## 2018-08-16 DIAGNOSIS — Z6836 Body mass index (BMI) 36.0-36.9, adult: Secondary | ICD-10-CM | POA: Diagnosis not present

## 2018-08-16 DIAGNOSIS — E669 Obesity, unspecified: Secondary | ICD-10-CM | POA: Insufficient documentation

## 2018-08-16 DIAGNOSIS — Z8711 Personal history of peptic ulcer disease: Secondary | ICD-10-CM | POA: Diagnosis not present

## 2018-08-16 DIAGNOSIS — N35912 Unspecified bulbous urethral stricture, male: Secondary | ICD-10-CM

## 2018-08-16 DIAGNOSIS — I10 Essential (primary) hypertension: Secondary | ICD-10-CM | POA: Insufficient documentation

## 2018-08-16 DIAGNOSIS — N35812 Other urethral bulbous stricture, male: Secondary | ICD-10-CM | POA: Diagnosis not present

## 2018-08-16 DIAGNOSIS — F419 Anxiety disorder, unspecified: Secondary | ICD-10-CM | POA: Diagnosis not present

## 2018-08-16 DIAGNOSIS — K219 Gastro-esophageal reflux disease without esophagitis: Secondary | ICD-10-CM | POA: Diagnosis not present

## 2018-08-16 DIAGNOSIS — Z7982 Long term (current) use of aspirin: Secondary | ICD-10-CM | POA: Insufficient documentation

## 2018-08-16 DIAGNOSIS — Z79899 Other long term (current) drug therapy: Secondary | ICD-10-CM | POA: Insufficient documentation

## 2018-08-16 DIAGNOSIS — N4340 Spermatocele of epididymis, unspecified: Secondary | ICD-10-CM | POA: Diagnosis not present

## 2018-08-16 HISTORY — PX: CYSTOSCOPY WITH URETHRAL DILATATION: SHX5125

## 2018-08-16 SURGERY — CYSTOSCOPY, WITH URETHRAL DILATION
Anesthesia: General | Site: Urethra | Wound class: Clean Contaminated

## 2018-08-16 MED ORDER — FENTANYL CITRATE (PF) 100 MCG/2ML IJ SOLN
INTRAMUSCULAR | Status: DC | PRN
  Administered 2018-08-16 (×2): 25 ug via INTRAVENOUS
  Administered 2018-08-16: 50 ug via INTRAVENOUS

## 2018-08-16 MED ORDER — PHENYLEPHRINE HCL 10 MG/ML IJ SOLN
INTRAMUSCULAR | Status: DC | PRN
  Administered 2018-08-16: 100 ug via INTRAVENOUS

## 2018-08-16 MED ORDER — LACTATED RINGERS IV SOLN
INTRAVENOUS | Status: DC
  Administered 2018-08-16 (×2): via INTRAVENOUS

## 2018-08-16 MED ORDER — FAMOTIDINE 20 MG PO TABS
20.0000 mg | ORAL_TABLET | Freq: Once | ORAL | Status: AC
  Administered 2018-08-16: 20 mg via ORAL

## 2018-08-16 MED ORDER — MIDAZOLAM HCL 2 MG/2ML IJ SOLN
INTRAMUSCULAR | Status: AC
  Filled 2018-08-16: qty 2

## 2018-08-16 MED ORDER — DEXAMETHASONE SODIUM PHOSPHATE 10 MG/ML IJ SOLN
INTRAMUSCULAR | Status: AC
  Filled 2018-08-16: qty 1

## 2018-08-16 MED ORDER — MIDAZOLAM HCL 2 MG/2ML IJ SOLN
INTRAMUSCULAR | Status: DC | PRN
  Administered 2018-08-16: 1 mg via INTRAVENOUS

## 2018-08-16 MED ORDER — FENTANYL CITRATE (PF) 100 MCG/2ML IJ SOLN
INTRAMUSCULAR | Status: AC
  Administered 2018-08-16: 25 ug via INTRAVENOUS
  Filled 2018-08-16: qty 2

## 2018-08-16 MED ORDER — PHENYLEPHRINE HCL 10 MG/ML IJ SOLN
INTRAMUSCULAR | Status: AC
  Filled 2018-08-16: qty 1

## 2018-08-16 MED ORDER — CIPROFLOXACIN IN D5W 400 MG/200ML IV SOLN
INTRAVENOUS | Status: AC
  Filled 2018-08-16: qty 200

## 2018-08-16 MED ORDER — DEXAMETHASONE SODIUM PHOSPHATE 10 MG/ML IJ SOLN
INTRAMUSCULAR | Status: DC | PRN
  Administered 2018-08-16: 10 mg via INTRAVENOUS

## 2018-08-16 MED ORDER — LIDOCAINE HCL (CARDIAC) PF 100 MG/5ML IV SOSY
PREFILLED_SYRINGE | INTRAVENOUS | Status: DC | PRN
  Administered 2018-08-16: 40 mg via INTRAVENOUS

## 2018-08-16 MED ORDER — SUCCINYLCHOLINE CHLORIDE 20 MG/ML IJ SOLN
INTRAMUSCULAR | Status: AC
  Filled 2018-08-16: qty 1

## 2018-08-16 MED ORDER — PROPOFOL 10 MG/ML IV BOLUS
INTRAVENOUS | Status: AC
  Filled 2018-08-16: qty 20

## 2018-08-16 MED ORDER — GLYCOPYRROLATE 0.2 MG/ML IJ SOLN
INTRAMUSCULAR | Status: DC | PRN
  Administered 2018-08-16: 0.2 mg via INTRAVENOUS

## 2018-08-16 MED ORDER — FENTANYL CITRATE (PF) 100 MCG/2ML IJ SOLN
25.0000 ug | INTRAMUSCULAR | Status: DC | PRN
  Administered 2018-08-16 (×2): 25 ug via INTRAVENOUS

## 2018-08-16 MED ORDER — EPHEDRINE SULFATE 50 MG/ML IJ SOLN
INTRAMUSCULAR | Status: DC | PRN
  Administered 2018-08-16: 10 mg via INTRAVENOUS

## 2018-08-16 MED ORDER — FAMOTIDINE 20 MG PO TABS
ORAL_TABLET | ORAL | Status: AC
  Administered 2018-08-16: 20 mg via ORAL
  Filled 2018-08-16: qty 1

## 2018-08-16 MED ORDER — ONDANSETRON HCL 4 MG/2ML IJ SOLN: 4.0000 mg | Freq: Once | INTRAMUSCULAR | Status: DC | PRN

## 2018-08-16 MED ORDER — FENTANYL CITRATE (PF) 100 MCG/2ML IJ SOLN
INTRAMUSCULAR | Status: AC
  Filled 2018-08-16: qty 2

## 2018-08-16 MED ORDER — ONDANSETRON HCL 4 MG/2ML IJ SOLN
INTRAMUSCULAR | Status: AC
  Filled 2018-08-16: qty 2

## 2018-08-16 MED ORDER — PROPOFOL 10 MG/ML IV BOLUS
INTRAVENOUS | Status: DC | PRN
  Administered 2018-08-16: 170 mg via INTRAVENOUS
  Administered 2018-08-16: 30 mg via INTRAVENOUS

## 2018-08-16 MED ORDER — ONDANSETRON HCL 4 MG/2ML IJ SOLN
INTRAMUSCULAR | Status: DC | PRN
  Administered 2018-08-16: 4 mg via INTRAVENOUS

## 2018-08-16 SURGICAL SUPPLY — 21 items
BAG URINE DRAINAGE (UROLOGICAL SUPPLIES) ×3 IMPLANT
BAG URINE LEG 25OZ (MISCELLANEOUS) ×6 IMPLANT
BALLN URETL DIL 7X10 (BALLOONS) ×3
BALLOON URETL DIL 7X10 (BALLOONS) ×1 IMPLANT
BRUSH SCRUB EZ 1% IODOPHOR (MISCELLANEOUS) ×3 IMPLANT
CATH FOL 2WAY LX 16X5 (CATHETERS) IMPLANT
CATH FOLEY 2W COUNCIL 5CC 18FR (CATHETERS) ×3 IMPLANT
ELECT REM PT RETURN 9FT ADLT (ELECTROSURGICAL)
ELECTRODE REM PT RTRN 9FT ADLT (ELECTROSURGICAL) IMPLANT
GLOVE BIO SURGEON STRL SZ 6.5 (GLOVE) ×2 IMPLANT
GLOVE BIO SURGEONS STRL SZ 6.5 (GLOVE) ×1
GOWN STRL REUS W/ TWL LRG LVL3 (GOWN DISPOSABLE) ×1 IMPLANT
GOWN STRL REUS W/TWL LRG LVL3 (GOWN DISPOSABLE) ×2
HOLDER FOLEY CATH W/STRAP (MISCELLANEOUS) ×3 IMPLANT
PACK CYSTO AR (MISCELLANEOUS) ×3 IMPLANT
SENSORWIRE 0.038 NOT ANGLED (WIRE) ×3
SET CYSTO W/LG BORE CLAMP LF (SET/KITS/TRAYS/PACK) ×3 IMPLANT
SYR 10ML LL (SYRINGE) ×3 IMPLANT
SYR 30ML LL (SYRINGE) ×3 IMPLANT
WATER STERILE IRR 1000ML POUR (IV SOLUTION) ×3 IMPLANT
WIRE SENSOR 0.038 NOT ANGLED (WIRE) ×1 IMPLANT

## 2018-08-16 NOTE — Anesthesia Post-op Follow-up Note (Signed)
Anesthesia QCDR form completed.        

## 2018-08-16 NOTE — Transfer of Care (Signed)
Immediate Anesthesia Transfer of Care Note  Patient: Jesse Norton  Procedure(s) Performed: CYSTOSCOPY WITH URETHRAL DILATATION (N/A Urethra)  Patient Location: PACU  Anesthesia Type:General  Level of Consciousness: awake, alert  and oriented  Airway & Oxygen Therapy: Patient Spontanous Breathing and Patient connected to face mask oxygen  Post-op Assessment: Report given to RN and Post -op Vital signs reviewed and stable  Post vital signs: Reviewed and stable  Last Vitals:  Vitals Value Taken Time  BP    Temp    Pulse 85 08/16/2018  8:22 AM  Resp 16 08/16/2018  8:22 AM  SpO2 100 % 08/16/2018  8:22 AM  Vitals shown include unvalidated device data.  Last Pain:  Vitals:   08/16/18 0626  TempSrc: Oral  PainSc: 0-No pain         Complications: No apparent anesthesia complications

## 2018-08-16 NOTE — Anesthesia Procedure Notes (Signed)
Procedure Name: LMA Insertion Date/Time: 08/16/2018 7:45 AM Performed by: Allean Found, CRNA Pre-anesthesia Checklist: Patient identified, Patient being monitored, Timeout performed, Emergency Drugs available and Suction available Patient Re-evaluated:Patient Re-evaluated prior to induction Oxygen Delivery Method: Circle system utilized Preoxygenation: Pre-oxygenation with 100% oxygen Induction Type: IV induction Ventilation: Mask ventilation without difficulty LMA: LMA inserted LMA Size: 5.0 Tube type: Oral Number of attempts: 1 Placement Confirmation: positive ETCO2 and breath sounds checked- equal and bilateral Tube secured with: Tape Dental Injury: Teeth and Oropharynx as per pre-operative assessment

## 2018-08-16 NOTE — Op Note (Signed)
Date of procedure: 08/16/18  Preoperative diagnosis:  1. Bulbourethral stricture  Postoperative diagnosis:  1. Same   Procedure: 1. Cystoscopy, balloon dilation urethral stricture  Surgeon: Nickolas Madrid, MD  Anesthesia: General  Complications: None  Intraoperative findings: ~81F bulbourethral stricture, normal cystoscopy  EBL: Minimal  Specimens: None  Drains: 18 French council Foley  Indication: Jesse Norton is a 72 y.o. patient with microscopic hematuria found to have 6 French proximal urethral stricture on office cystoscopy.  After reviewing the management options for treatment, they elected to proceed with the above surgical procedure(s). We have discussed the potential benefits and risks of the procedure, side effects of the proposed treatment, the likelihood of the patient achieving the goals of the procedure, and any potential problems that might occur during the procedure or recuperation. Informed consent has been obtained.  Description of procedure:  The patient was taken to the operating room and general anesthesia was induced.  The patient was placed in the dorsal lithotomy position, prepped and draped in the usual sterile fashion, and preoperative antibiotics(Cipro) were administered. A preoperative time-out was performed.   The 21 French rigid cystoscope with a 30 degree lens was used to intubate the urethra.  The urethra was grossly normal up until the bulbar urethra where an approximately 6 French urethral stricture was seen.  This was short and less than 1 cm in length.  A 0.035 sensor wire was advanced through the lumen of the stricture into the bladder.  A 21 French dilating UroMax balloon was then advanced over the wire and the stricture was dilated up to a pressure of 18 ATM under direct vision.  The urethra was widely patent after dilation.  We were able to pass the scope into the bladder.  The prostate was moderate in size with a high bladder neck.  Thorough  cystoscopy was performed and showed no concerning lesions, the ureteral orifices were orthotopic bilaterally.  The sensor wire was advanced into the bladder and the rigid cystoscope was removed.  An [place] Foley passed easily over the wire to the bladder and 10 cc was inserted into the balloon.  Disposition: Stable to PACU  Plan: Patient will remove Foley catheter at home on Sunday morning, then follow-up in 6 weeks in clinic for uroflow PVR.  [name], MD 08/16/2018

## 2018-08-16 NOTE — Telephone Encounter (Signed)
Patient has been scheduled however he is going to be out of town when he is due to come in so his follow up will be a week after he was due if that's ok? He also stated that he was having a little bit of urine come out of the catheter when he urinated not a lot and wanted to make sure that was ok. It is now 5:30 and I am the only one here still so I told him I thought it was ok as long as most of it was going into the bag. He said it was and I advised him to call the 24 hour nurse line if he had any other problems otherwise call the office on Monday. If something else needs to be done would you mind calling him to let him know what he needs to do before Monday? Not sure what else to do at this moment.  Thanks, Sharyn Lull

## 2018-08-16 NOTE — Anesthesia Postprocedure Evaluation (Signed)
Anesthesia Post Note  Patient: Jesse Norton  Procedure(s) Performed: CYSTOSCOPY WITH URETHRAL DILATATION (N/A Urethra)  Patient location during evaluation: PACU Anesthesia Type: General Level of consciousness: awake and alert Pain management: pain level controlled Vital Signs Assessment: post-procedure vital signs reviewed and stable Respiratory status: spontaneous breathing, nonlabored ventilation, respiratory function stable and patient connected to nasal cannula oxygen Cardiovascular status: blood pressure returned to baseline and stable Postop Assessment: no apparent nausea or vomiting Anesthetic complications: no     Last Vitals:  Vitals:   08/16/18 0930 08/16/18 1006  BP: 122/76 123/71  Pulse: 73 77  Resp: 16   Temp: (!) 35.7 C   SpO2: 97% 97%    Last Pain:  Vitals:   08/16/18 1006  TempSrc:   PainSc: 2                  Mackena Plummer S

## 2018-08-16 NOTE — Telephone Encounter (Signed)
-----   Message from [name], MD sent at 08/16/2018  8:35 AM EDT ----- Regarding: follow up Follow up in urology clinic in 6 weeks with uroflow/PVR  [name], MD 08/16/2018

## 2018-08-16 NOTE — Anesthesia Preprocedure Evaluation (Signed)
Anesthesia Evaluation  Patient identified by MRN, date of birth, ID band Patient awake    Reviewed: Allergy & Precautions, NPO status , Patient's Chart, lab work & pertinent test results, reviewed documented beta blocker date and time   Airway Mallampati: III  TM Distance: >3 FB     Dental  (+) Chipped   Pulmonary           Cardiovascular hypertension, Pt. on medications and Pt. on home beta blockers      Neuro/Psych Anxiety    GI/Hepatic PUD, GERD  Controlled,  Endo/Other    Renal/GU      Musculoskeletal   Abdominal   Peds  Hematology   Anesthesia Other Findings Obese. No regurgitation. Can sleep flat in bed. I think LMA is safe for this pt.  Reproductive/Obstetrics                             Anesthesia Physical Anesthesia Plan  ASA: III  Anesthesia Plan: General   Post-op Pain Management:    Induction: Intravenous  PONV Risk Score and Plan:   Airway Management Planned: LMA  Additional Equipment:   Intra-op Plan:   Post-operative Plan:   Informed Consent: I have reviewed the patients History and Physical, chart, labs and discussed the procedure including the risks, benefits and alternatives for the proposed anesthesia with the patient or authorized representative who has indicated his/her understanding and acceptance.     Plan Discussed with: CRNA  Anesthesia Plan Comments:         Anesthesia Quick Evaluation

## 2018-08-16 NOTE — H&P (Signed)
UROLOGY H&P UPDATE  72 yo M with urethral stricture, mild voiding symptoms  Cardiac: RRR Lungs: CTA bilaterally  Informed consent obtained for cystoscopy, balloon dilation, possible TURBT. Discussed risks of bleeding, infection, need for foley, and recurrence.  [name], MD 08/16/2018

## 2018-08-20 ENCOUNTER — Ambulatory Visit: Payer: Medicare Other | Admitting: Anesthesiology

## 2018-08-20 ENCOUNTER — Encounter
Admission: RE | Disposition: A | Discharge status: Home / Self Care | Payer: Self-pay | Source: Ambulatory Visit | Attending: Gastroenterology

## 2018-08-20 ENCOUNTER — Encounter: Payer: Self-pay | Admitting: Emergency Medicine

## 2018-08-20 ENCOUNTER — Ambulatory Visit
Admission: RE | Admit: 2018-08-20 | Discharge: 2018-08-20 | Disposition: A | Discharge status: Home / Self Care | Payer: Medicare Other | Source: Ambulatory Visit | Attending: Gastroenterology | Admitting: Gastroenterology

## 2018-08-20 DIAGNOSIS — Z8719 Personal history of other diseases of the digestive system: Secondary | ICD-10-CM | POA: Diagnosis not present

## 2018-08-20 DIAGNOSIS — I10 Essential (primary) hypertension: Secondary | ICD-10-CM | POA: Insufficient documentation

## 2018-08-20 DIAGNOSIS — Z79899 Other long term (current) drug therapy: Secondary | ICD-10-CM | POA: Insufficient documentation

## 2018-08-20 DIAGNOSIS — D374 Neoplasm of uncertain behavior of colon: Secondary | ICD-10-CM | POA: Diagnosis not present

## 2018-08-20 DIAGNOSIS — D120 Benign neoplasm of cecum: Secondary | ICD-10-CM | POA: Diagnosis not present

## 2018-08-20 DIAGNOSIS — K641 Second degree hemorrhoids: Secondary | ICD-10-CM | POA: Insufficient documentation

## 2018-08-20 DIAGNOSIS — K648 Other hemorrhoids: Secondary | ICD-10-CM | POA: Diagnosis not present

## 2018-08-20 DIAGNOSIS — K635 Polyp of colon: Secondary | ICD-10-CM | POA: Diagnosis not present

## 2018-08-20 DIAGNOSIS — Z7982 Long term (current) use of aspirin: Secondary | ICD-10-CM | POA: Diagnosis not present

## 2018-08-20 DIAGNOSIS — F419 Anxiety disorder, unspecified: Secondary | ICD-10-CM | POA: Diagnosis not present

## 2018-08-20 DIAGNOSIS — D125 Benign neoplasm of sigmoid colon: Secondary | ICD-10-CM | POA: Insufficient documentation

## 2018-08-20 DIAGNOSIS — K5790 Diverticulosis of intestine, part unspecified, without perforation or abscess without bleeding: Secondary | ICD-10-CM | POA: Diagnosis not present

## 2018-08-20 DIAGNOSIS — D128 Benign neoplasm of rectum: Secondary | ICD-10-CM | POA: Diagnosis not present

## 2018-08-20 DIAGNOSIS — Z1211 Encounter for screening for malignant neoplasm of colon: Secondary | ICD-10-CM | POA: Diagnosis not present

## 2018-08-20 DIAGNOSIS — M109 Gout, unspecified: Secondary | ICD-10-CM | POA: Insufficient documentation

## 2018-08-20 DIAGNOSIS — K640 First degree hemorrhoids: Secondary | ICD-10-CM | POA: Diagnosis not present

## 2018-08-20 DIAGNOSIS — D124 Benign neoplasm of descending colon: Secondary | ICD-10-CM | POA: Diagnosis not present

## 2018-08-20 DIAGNOSIS — K5730 Diverticulosis of large intestine without perforation or abscess without bleeding: Secondary | ICD-10-CM | POA: Insufficient documentation

## 2018-08-20 DIAGNOSIS — D123 Benign neoplasm of transverse colon: Secondary | ICD-10-CM | POA: Insufficient documentation

## 2018-08-20 DIAGNOSIS — Z86010 Personal history of colonic polyps: Secondary | ICD-10-CM | POA: Diagnosis not present

## 2018-08-20 DIAGNOSIS — K219 Gastro-esophageal reflux disease without esophagitis: Secondary | ICD-10-CM | POA: Insufficient documentation

## 2018-08-20 DIAGNOSIS — K621 Rectal polyp: Secondary | ICD-10-CM | POA: Diagnosis not present

## 2018-08-20 HISTORY — PX: COLONOSCOPY WITH PROPOFOL: SHX5780

## 2018-08-20 HISTORY — DX: Personal history of other diseases of urinary system: Z87.448

## 2018-08-20 HISTORY — DX: Benign neoplasm of duodenum: D13.2

## 2018-08-20 HISTORY — DX: Unspecified hemorrhoids: K64.9

## 2018-08-20 SURGERY — COLONOSCOPY WITH PROPOFOL
Anesthesia: General

## 2018-08-20 MED ORDER — PROPOFOL 500 MG/50ML IV EMUL
INTRAVENOUS | Status: DC | PRN
  Administered 2018-08-20: 150 ug/kg/min via INTRAVENOUS

## 2018-08-20 MED ORDER — LIDOCAINE HCL (PF) 2 % IJ SOLN
INTRAMUSCULAR | Status: AC
  Filled 2018-08-20: qty 10

## 2018-08-20 MED ORDER — LIDOCAINE HCL (CARDIAC) PF 100 MG/5ML IV SOSY
PREFILLED_SYRINGE | INTRAVENOUS | Status: DC | PRN
  Administered 2018-08-20: 100 mg via INTRAVENOUS

## 2018-08-20 MED ORDER — PROPOFOL 500 MG/50ML IV EMUL
INTRAVENOUS | Status: AC
  Filled 2018-08-20: qty 50

## 2018-08-20 MED ORDER — PROPOFOL 10 MG/ML IV BOLUS
INTRAVENOUS | Status: DC | PRN
  Administered 2018-08-20: 110 mg via INTRAVENOUS

## 2018-08-20 MED ORDER — SODIUM CHLORIDE 0.9 % IV SOLN
INTRAVENOUS | Status: DC
  Administered 2018-08-20: 1000 mL via INTRAVENOUS

## 2018-08-20 NOTE — Transfer of Care (Signed)
Immediate Anesthesia Transfer of Care Note  Patient: Jesse Norton  Procedure(s) Performed: COLONOSCOPY WITH PROPOFOL (N/A )  Patient Location: Endoscopy Unit  Anesthesia Type:General  Level of Consciousness: sedated  Airway & Oxygen Therapy: Patient Spontanous Breathing and Patient connected to nasal cannula oxygen  Post-op Assessment: Report given to RN and Post -op Vital signs reviewed and stable  Post vital signs: Reviewed and stable  Last Vitals:  Vitals Value Taken Time  BP 101/66 08/20/2018 11:47 AM  Temp 36 C 08/20/2018 11:47 AM  Pulse 70 08/20/2018 11:51 AM  Resp 20 08/20/2018 11:51 AM  SpO2 98 % 08/20/2018 11:51 AM  Vitals shown include unvalidated device data.  Last Pain:  Vitals:   08/20/18 1147  TempSrc: Tympanic  PainSc: Asleep         Complications: No apparent anesthesia complications

## 2018-08-20 NOTE — Anesthesia Post-op Follow-up Note (Signed)
Anesthesia QCDR form completed.        

## 2018-08-20 NOTE — Op Note (Signed)
[facility] Gastroenterology Patient Name: Jesse Norton Procedure Date: 08/20/2018 10:51 AM MRN: [id number] Account #: [id number] Date of Birth: Apr 21, 1946 Admit Type: Outpatient Age: 72 Room: [facility] ENDO ROOM 3 Gender: Male Note Status: Finalized Procedure:            Colonoscopy Indications:          Personal history of colonic polyps Providers:            Lollie Sails, MD Referring MD:         [name], MD (Referring MD) Medicines:            Monitored Anesthesia Care Complications:        No immediate complications. Procedure:            Pre-Anesthesia Assessment:                       - ASA Grade Assessment: III - A patient with severe                        systemic disease.                       After obtaining informed consent, the colonoscope was                        passed under direct vision. Throughout the procedure,                        the patient's blood pressure, pulse, and oxygen                        saturations were monitored continuously. The                        Colonoscope was introduced through the anus and                        advanced to the the cecum, identified by appendiceal                        orifice and ileocecal valve. The colonoscopy was                        performed with moderate difficulty due to significant                        looping. Successful completion of the procedure was                        aided by changing the patient to a prone position and                        using manual pressure. The patient tolerated the                        procedure well. The quality of the bowel preparation                        was good. Findings:      A few small-mouthed diverticula were found in the sigmoid colon,  descending colon and distal descending colon.      A 10 mm polyp was found in the mid sigmoid colon. The polyp was       semi-pedunculated. The polyp was removed with a cold snare.  Resection       and retrieval were complete.      A 4 mm polyp was found in the proximal descending colon. The polyp was       sessile. The polyp was removed with a cold snare. Resection and       retrieval were complete.      Two sessile polyps were found in the splenic flexure. The polyps were 3       to 4 mm in size. These polyps were removed with a cold snare. Resection       and retrieval were complete.      A 3 mm polyp was found in the splenic flexure. The polyp was sessile.       The polyp was removed with a cold biopsy forceps. Resection and       retrieval were complete.      A 1 mm polyp was found in the cecum. The polyp was sessile. The polyp       was removed with a cold biopsy forceps. Resection and retrieval were       complete.      Two sessile polyps were found in the proximal sigmoid colon. The polyps       were 3 to 5 mm in size. These polyps were removed with a cold snare.       Resection and retrieval were complete.      A 2 mm polyp was found in the sigmoid colon. The polyp was sessile. The       polyp was removed with a cold biopsy forceps. Resection and retrieval       were complete.      A 2 mm polyp was found in the rectum. The polyp was sessile. The polyp       was removed with a cold biopsy forceps. Resection and retrieval were       complete.      Non-bleeding internal hemorrhoids were found during retroflexion, during       digital exam and during anoscopy. The hemorrhoids were small, Grade I       (internal hemorrhoids that do not prolapse) and Grade II (internal       hemorrhoids that prolapse but reduce spontaneously).      The digital rectal exam was normal otherwise. There was a small amount       of bleeding from a hemorrhoid on DRE, resolved. Impression:           - Diverticulosis in the sigmoid colon, in the                        descending colon and in the distal descending colon.                       - One 10 mm polyp in the mid sigmoid colon,  removed                        with a cold snare. Resected and retrieved.                       - One 4  mm polyp in the proximal descending colon,                        removed with a cold snare. Resected and retrieved.                       - Two 3 to 4 mm polyps at the splenic flexure, removed                        with a cold snare. Resected and retrieved.                       - One 3 mm polyp at the splenic flexure, removed with a                        cold biopsy forceps. Resected and retrieved.                       - One 1 mm polyp in the cecum, removed with a cold                        biopsy forceps. Resected and retrieved.                       - Two 3 to 5 mm polyps in the proximal sigmoid colon,                        removed with a cold snare. Resected and retrieved.                       - One 2 mm polyp in the sigmoid colon, removed with a                        cold biopsy forceps. Resected and retrieved.                       - One 2 mm polyp in the rectum, removed with a cold                        biopsy forceps. Resected and retrieved.                       - Non-bleeding internal hemorrhoids. Recommendation:       - Discharge patient to home.                       - Await pathology results.                       - Telephone GI clinic for pathology results in 1 week. Procedure Code(s):    --- Professional ---                       [phone], Colonoscopy, flexible; with removal of tumor(s),                        polyp(s), or other lesion(s) by snare technique  [id number], 6, Colonoscopy, flexible; with biopsy, single                        or multiple Diagnosis Code(s):    --- Professional ---                       K64.1, Second degree hemorrhoids                       D12.4, Benign neoplasm of descending colon                       D12.3, Benign neoplasm of transverse colon (hepatic                        flexure or splenic flexure)                        D12.0, Benign neoplasm of cecum                       D12.5, Benign neoplasm of sigmoid colon                       K62.1, Rectal polyp                       Z86.010, Personal history of colonic polyps                       K57.30, Diverticulosis of large intestine without                        perforation or abscess without bleeding CPT copyright 2017 American Medical Association. All rights reserved. The codes documented in this report are preliminary and upon coder review may  be revised to meet current compliance requirements. Lollie Sails, MD 08/20/2018 11:51:41 AM This report has been signed electronically. Number of Addenda: 0 Note Initiated On: 08/20/2018 10:51 AM Scope Withdrawal Time: 0 hours 16 minutes 14 seconds  Total Procedure Duration: 0 hours 37 minutes 31 seconds       [facility]

## 2018-08-20 NOTE — Anesthesia Postprocedure Evaluation (Signed)
Anesthesia Post Note  Patient: Jesse Norton  Procedure(s) Performed: COLONOSCOPY WITH PROPOFOL (N/A )  Patient location during evaluation: Endoscopy Anesthesia Type: General Level of consciousness: awake and alert Pain management: pain level controlled Vital Signs Assessment: post-procedure vital signs reviewed and stable Respiratory status: spontaneous breathing, nonlabored ventilation, respiratory function stable and patient connected to nasal cannula oxygen Cardiovascular status: blood pressure returned to baseline and stable Postop Assessment: no apparent nausea or vomiting Anesthetic complications: no     Last Vitals:  Vitals:   08/20/18 1207 08/20/18 1217  BP: 110/79 124/78  Pulse:    Resp: 18 17  Temp:    SpO2:      Last Pain:  Vitals:   08/20/18 1217  TempSrc:   PainSc: 0-No pain                 Martha Clan

## 2018-08-20 NOTE — H&P (Signed)
Outpatient short stay form Pre-procedure 08/20/2018 10:20 AM Lollie Sails MD  Primary Physician: Golden Pop, MD  Reason for visit: Colonoscopy  History of present illness: Patient is a 72 year old male presenting today as above.  He has a personal history of adenomatous colon polyps.  His last colonoscopy was in 2014.  He tolerated his prep well.  He does take a daily 81 mg aspirin which he is held for couple of days.  He takes no other aspirin products or blood thinning agent.   No current facility-administered medications for this encounter.   Medications Prior to Admission  Medication Sig Dispense Refill Last Dose  . allopurinol (ZYLOPRIM) 300 MG tablet Take 300 mg by mouth daily.     Marland Kitchen amLODipine (NORVASC) 10 MG tablet Take 1 tablet (10 mg total) by mouth daily. 90 tablet 4 08/16/2018 at 500  . aspirin EC 81 MG tablet Take 81 mg by mouth daily.   08/14/2018  . Colchicine 0.6 MG CAPS TAKE 2 CAPSULES BY MOUTH NOW, THEN 1 CAPSULE DAILY (Patient not taking: No sig reported) 30 capsule 0 Not Taking at Unknown time  . febuxostat (ULORIC) 40 MG tablet Take 1 tablet (40 mg total) by mouth daily. (Patient not taking: Reported on 08/15/2018) 90 tablet 4 Not Taking at Unknown time  . LORazepam (ATIVAN) 1 MG tablet Take 0.5-1 tablets (0.5-1 mg total) by mouth daily as needed for anxiety. 30 tablet 1 Past Month at Unknown time  . losartan-hydrochlorothiazide (HYZAAR) 100-25 MG tablet Take 1 tablet by mouth daily. 90 tablet 4 08/16/2018 at 500  . meloxicam (MOBIC) 15 MG tablet Take 1 tablet (15 mg total) by mouth daily. (Patient not taking: Reported on 08/15/2018) 30 tablet 3 Not Taking at Unknown time  . metoprolol tartrate (LOPRESSOR) 25 MG tablet Take 1 tablet (25 mg total) by mouth 2 (two) times daily. 180 tablet 4 08/16/2018 at 500  . pantoprazole (PROTONIX) 20 MG tablet Take 1 tablet (20 mg total) by mouth 2 (two) times daily. (Patient taking differently: Take 20 mg by mouth daily. ) 180 tablet 4  08/15/2018 at 1600     No Known Allergies   Past Medical History:  Diagnosis Date  . Anxiety   . Barrett's esophagus   . Duodenal adenoma   . GERD (gastroesophageal reflux disease)   . Gout   . H/O urethral stricture   . Hemorrhoids   . Hypertension   . Stomach ulcer     Review of systems:      Physical Exam    Heart and lungs: Regular rate and rhythm without rub or gallop, lungs are bilaterally clear.    HEENT: Normocephalic atraumatic eyes are anicteric    Other:    Pertinant exam for procedure: Soft nontender nondistended bowel sounds positive normoactive, protuberant.    Planned proceedures: Colonoscopy and indicated procedures. I have discussed the risks benefits and complications of procedures to include not limited to bleeding, infection, perforation and the risk of sedation and the patient wishes to proceed.    Lollie Sails, MD Gastroenterology 08/20/2018  10:20 AM

## 2018-08-20 NOTE — Anesthesia Preprocedure Evaluation (Signed)
Anesthesia Evaluation  Patient identified by MRN, date of birth, ID band Patient awake    Reviewed: Allergy & Precautions, NPO status , Patient's Chart, lab work & pertinent test results  History of Anesthesia Complications Negative for: history of anesthetic complications  Airway Mallampati: III  TM Distance: >3 FB Neck ROM: Full    Dental  (+) Implants, Dental Advidsory Given   Pulmonary neg pulmonary ROS, neg sleep apnea, neg COPD,    breath sounds clear to auscultation- rhonchi (-) wheezing      Cardiovascular Exercise Tolerance: Good hypertension, Pt. on medications (-) CAD and (-) Past MI  Rhythm:Regular Rate:Normal - Systolic murmurs and - Diastolic murmurs    Neuro/Psych negative neurological ROS  negative psych ROS   GI/Hepatic Neg liver ROS, PUD, GERD  Medicated,  Endo/Other  negative endocrine ROSneg diabetes  Renal/GU negative Renal ROS     Musculoskeletal negative musculoskeletal ROS (+)   Abdominal (+) + obese,   Peds  Hematology negative hematology ROS (+)   Anesthesia Other Findings Past Medical History: No date: Anxiety No date: Barrett's esophagus No date: GERD (gastroesophageal reflux disease) No date: Hypertension No date: Stomach ulcer   Reproductive/Obstetrics                             Anesthesia Physical  Anesthesia Plan  ASA: III  Anesthesia Plan: General   Post-op Pain Management:    Induction: Intravenous  PONV Risk Score and Plan: 2 and Propofol infusion and TIVA  Airway Management Planned: Natural Airway and Nasal Cannula  Additional Equipment:   Intra-op Plan:   Post-operative Plan:   Informed Consent: I have reviewed the patients History and Physical, chart, labs and discussed the procedure including the risks, benefits and alternatives for the proposed anesthesia with the patient or authorized representative who has indicated his/her  understanding and acceptance.   Dental advisory given  Plan Discussed with: CRNA and Anesthesiologist  Anesthesia Plan Comments:         Anesthesia Quick Evaluation

## 2018-08-21 ENCOUNTER — Encounter: Payer: Self-pay | Admitting: Gastroenterology

## 2018-09-12 LAB — SURGICAL PATHOLOGY

## 2018-09-19 ENCOUNTER — Ambulatory Visit (INDEPENDENT_AMBULATORY_CARE_PROVIDER_SITE_OTHER): Payer: Medicare Other | Admitting: Internal Medicine

## 2018-09-19 ENCOUNTER — Encounter: Payer: Self-pay | Admitting: Internal Medicine

## 2018-09-19 VITALS — BP 130/82 | HR 85 | Ht 71.0 in | Wt 263.5 lb

## 2018-09-19 DIAGNOSIS — R0789 Other chest pain: Secondary | ICD-10-CM

## 2018-09-19 NOTE — Patient Instructions (Addendum)
Medication Instructions:  Your physician recommends that you continue on your current medications as directed. Please refer to the Current Medication list given to you today.  If you need a refill on your cardiac medications before your next appointment, please call your pharmacy.   Lab work: none If you have labs (blood work) drawn today and your tests are completely normal, you will receive your results only by: Marland Kitchen MyChart Message (if you have MyChart) OR . A paper copy in the mail If you have any lab test that is abnormal or we need to change your treatment, we will call you to review the results.  Testing/Procedures: Your physician has requested that you have an exercise tolerance test. For further information please visit [email or web address]. Please also follow instruction sheet, as given.   DO NOT drink or eat foods with caffeine for 24 hours before the test. (Chocolate, coffee, tea, decaf coffee/tea, or energy drinks)  DO NOT smoke for 4 hours before your test.  If you use an inhaler, bring it with you to the test.  Wear comfortable shoes and clothing.  DO NOT TAKE METOPROLOL THE NIGHT BEFORE OR MORNING OF.    Follow-Up: At [facility], you and your health needs are our priority.  As part of our continuing mission to provide you with exceptional heart care, we have created designated Provider Care Teams.  These Care Teams include your primary Cardiologist (physician) and Advanced Practice Providers (APPs -  Physician Assistants and Nurse Practitioners) who all work together to provide you with the care you need, when you need it. You will need a follow up appointment in 6 months.  Please call our office 2 months in advance to schedule this appointment.  You may see Dr Harrell Gave End or one of the following Advanced Practice Providers on your designated Care Team:   Murray Hodgkins, NP Christell Faith, PA-C . Marrianne Mood, PA-C     Exercise Stress Electrocardiogram An  exercise stress electrocardiogram is a test to check how blood flows to your heart. It is done to find areas of poor blood flow. You will need to walk on a treadmill for this test. The electrocardiogram will record your heartbeat when you are at rest and when you are exercising. What happens before the procedure?  Do not have drinks with caffeine or foods with caffeine for 24 hours before the test, or as told by your doctor. This includes coffee, tea (even decaf tea), sodas, chocolate, and cocoa.  Follow your doctor's instructions about eating and drinking before the test.  Ask your doctor what medicines you should or should not take before the test. Take your medicines with water unless told by your doctor not to.  If you use an inhaler, bring it with you to the test.  Bring a snack to eat after the test.  Do not  smoke for 4 hours before the test.  Do not put lotions, powders, creams, or oils on your chest before the test.  Wear comfortable shoes and clothing. What happens during the procedure?  You will have patches put on your chest. Small areas of your chest may need to be shaved. Wires will be connected to the patches.  Your heart rate will be watched while you are resting and while you are exercising.  You will walk on the treadmill. The treadmill will slowly get faster to raise your heart rate.  The test will take about 1-2 hours. What happens after the procedure?  Your heart  rate and blood pressure will be watched after the test.  You may return to your normal diet, activities, and medicines or as told by your doctor. This information is not intended to replace advice given to you by your health care provider. Make sure you discuss any questions you have with your health care provider. Document Released: 05/15/2008 Document Revised: 07/26/2016 Document Reviewed: 08/04/2013 Elsevier Interactive Patient Education  [employer].

## 2018-09-19 NOTE — Progress Notes (Signed)
New Outpatient Visit Date: 09/19/2018  Referring Provider: Arta Silence, MD [facility] Emergency Department  Chief Complaint: Chest pain  HPI:  Jesse Norton is a 72 y.o. male who is being seen today for the evaluation of chest pain at the request of Dr. Cherylann Banas. He has a history of hypertension, Barrett's esophagus, gout, and BPH.  He presented to the [facility] emergency department in late July with left-sided chest pain radiating to the arm that began at rest.  He noted some accompanying nausea and shortness of breath.  Emergency department work-up was unrevealing.  Today, Jesse Norton feels well.  He notes two episodes of chest pain (2017 and this summer) that both led to ED visits.  He describes pressure in his chest accompanied by an overwhelming sense of being out of control in the setting of very stressful situations.  The chest pressure was very mild and was accompanied by vague nausea.  He has not had any exertional chest pain and walks 3 to 4 miles a day on his treadmill without symptoms.  There were no other accompanying symptoms such as shortness of breath, palpitations, lightheadedness, and diaphoresis.  Jesse Norton reports having undergone a stress test many years ago, which he believes was normal.  Jesse Norton today notes 1 or 2 episodes of palpitations over the last few years that were not associated with the aforementioned chest pain.  He denies orthopnea, PND, and edema.  --------------------------------------------------------------------------------------------------  Cardiovascular History & Procedures: Cardiovascular Problems:  Chest pain  Risk Factors:  Hypertension, male gender, obesity, and age greater than 2  Cath/PCI:  None  CV Surgery:  None  EP Procedures and Devices:  None  Non-Invasive Evaluation(s):  None  Recent CV Pertinent Labs: Lab Results  Component Value Date   CHOL 169 06/26/2018   HDL 59 06/26/2018   LDLCALC 98 06/26/2018   TRIG 62  06/26/2018   CHOLHDL 2.9 06/26/2018   K 3.5 07/03/2018   K 4.0 01/06/2014   BUN 17 07/18/2018   BUN 22 (H) 01/06/2014   CREATININE 1.13 07/18/2018   CREATININE 1.13 01/06/2014    --------------------------------------------------------------------------------------------------  Past Medical History:  Diagnosis Date  . Anxiety   . Barrett's esophagus   . Duodenal adenoma   . GERD (gastroesophageal reflux disease)   . Gout   . H/O urethral stricture   . Hemorrhoids   . Hypertension   . Stomach ulcer     Past Surgical History:  Procedure Laterality Date  . COLONOSCOPY    . COLONOSCOPY WITH PROPOFOL N/A 08/20/2018   Procedure: COLONOSCOPY WITH PROPOFOL;  Surgeon: Lollie Sails, MD;  Location: [facility] ENDOSCOPY;  Service: Endoscopy;  Laterality: N/A;  . CYSTOSCOPY WITH URETHRAL DILATATION N/A 08/16/2018   Procedure: CYSTOSCOPY WITH URETHRAL DILATATION;  Surgeon: [name], MD;  Location: [facility] ORS;  Service: Urology;  Laterality: N/A;  . ESOPHAGOGASTRODUODENOSCOPY N/A 01/29/2017   Procedure: ESOPHAGOGASTRODUODENOSCOPY (EGD);  Surgeon: Lollie Sails, MD;  Location: [facility] ENDOSCOPY;  Service: Endoscopy;  Laterality: N/A;  . ESOPHAGOGASTRODUODENOSCOPY (EGD) WITH PROPOFOL N/A 07/10/2016   Procedure: ESOPHAGOGASTRODUODENOSCOPY (EGD) WITH PROPOFOL;  Surgeon: Lollie Sails, MD;  Location: [facility] ENDOSCOPY;  Service: Endoscopy;  Laterality: N/A;  . gastritis    . h.pylori    . left elbow repair Left   . SPLENECTOMY  1957  . TONSILLECTOMY      Current Meds  Medication Sig  . amLODipine (NORVASC) 10 MG tablet Take 1 tablet (10 mg total) by mouth daily.  Marland Kitchen aspirin  EC 81 MG tablet Take 81 mg by mouth daily.  . febuxostat (ULORIC) 40 MG tablet Take 1 tablet (40 mg total) by mouth daily.  Marland Kitchen LORazepam (ATIVAN) 1 MG tablet Take 0.5-1 tablets (0.5-1 mg total) by mouth daily as needed for anxiety.  Marland Kitchen losartan-hydrochlorothiazide (HYZAAR) 100-25 MG tablet Take 1 tablet by mouth  daily.  . metoprolol tartrate (LOPRESSOR) 25 MG tablet Take 1 tablet (25 mg total) by mouth 2 (two) times daily.  . pantoprazole (PROTONIX) 20 MG tablet Take 1 tablet (20 mg total) by mouth 2 (two) times daily. (Patient taking differently: Take 20 mg by mouth daily. )    Allergies: Patient has no known allergies.  Social History   Tobacco Use  . Smoking status: Never Smoker  . Smokeless tobacco: Never Used  Substance Use Topics  . Alcohol use: Yes    Alcohol/week: 5.0 standard drinks    Types: 5 Glasses of wine per week  . Drug use: No    Family History  Adopted: Yes  Problem Relation Age of Onset  . Heart attack Mother 78  . Pancreatic cancer Father   . Hypertension Sister   . Hyperlipidemia Sister   . Hypertension Brother   . Hyperlipidemia Brother     Review of Systems: A 12-system review of systems was performed and was negative except as noted in the HPI.  --------------------------------------------------------------------------------------------------  Physical Exam: BP 130/82 (BP Location: Right Arm, Patient Position: Sitting, Cuff Size: Normal)   Pulse 85   Ht 5\' 11"  (1.803 m)   Wt 263 lb 8 oz (119.5 kg)   BMI 36.75 kg/m   General: NAD. HEENT: No conjunctival pallor or scleral icterus. Moist mucous membranes. OP clear. Neck: Supple without lymphadenopathy, thyromegaly, JVD, or HJR. No carotid bruit. Lungs: Normal work of breathing. Clear to auscultation bilaterally without wheezes or crackles. Heart: Regular rate and rhythm without murmurs, rubs, or gallops.  Unable to assess PMI due to body habitus. Abd: Bowel sounds present. Soft, NT/ND.  Unable to assess HSM due to body habitus. Ext: No lower extremity edema. Radial, PT, and DP pulses are 2+ bilaterally Skin: Warm and dry without rash. Neuro: CNIII-XII intact. Strength and fine-touch sensation intact in upper and lower extremities bilaterally. Psych: Normal mood and affect.  EKG: Normal sinus rhythm  without abnormalities.  Lab Results  Component Value Date   WBC 8.2 07/03/2018   HGB 15.0 07/03/2018   HCT 42.4 07/03/2018   MCV 99.0 07/03/2018   PLT 291 07/03/2018    Lab Results  Component Value Date   NA 137 07/03/2018   K 3.5 07/03/2018   CL 98 07/03/2018   CO2 30 07/03/2018   BUN 17 07/18/2018   CREATININE 1.13 07/18/2018   GLUCOSE 125 (H) 07/03/2018   ALT 41 06/26/2018    Lab Results  Component Value Date   CHOL 169 06/26/2018   HDL 59 06/26/2018   LDLCALC 98 06/26/2018   TRIG 62 06/26/2018   CHOLHDL 2.9 06/26/2018     --------------------------------------------------------------------------------------------------  ASSESSMENT AND PLAN: Atypical chest pain Pain is atypical and has occurred twice under very stressful circumstances.  Jesse Norton is able to exercise without difficulty.  Cardiac risk factors include hypertension, male gender, obesity, and age.  In light of these risk factors, we have agreed to perform an exercise tolerance test.  We will continue his current medications for primary prevention.  Follow-up: Turn to clinic in 6 months, sooner if recurrent symptoms or abnormal stress test.  Nelva Bush, MD 09/19/2018 9:29 PM

## 2018-10-08 ENCOUNTER — Other Ambulatory Visit: Payer: Self-pay

## 2018-10-08 ENCOUNTER — Ambulatory Visit (INDEPENDENT_AMBULATORY_CARE_PROVIDER_SITE_OTHER): Payer: Medicare Other | Admitting: Urology

## 2018-10-08 ENCOUNTER — Encounter: Payer: Self-pay | Admitting: Urology

## 2018-10-08 VITALS — BP 156/97 | HR 77 | Ht 71.0 in | Wt 265.3 lb

## 2018-10-08 DIAGNOSIS — N35919 Unspecified urethral stricture, male, unspecified site: Secondary | ICD-10-CM | POA: Diagnosis not present

## 2018-10-08 LAB — BLADDER SCAN AMB NON-IMAGING: Scan Result: 71

## 2018-10-08 NOTE — Progress Notes (Signed)
   10/08/2018 11:34 AM   Jesse Norton 1946-03-24 [id number]  Reason for visit: Follow up urethral stricture status post balloon dilation  I had the pleasure of seeing [name] back in urology clinic.  He is a 72 year old male that underwent a microscopic hematuria work-up with a normal CT urogram, and flexible clinic cystoscopy that demonstrated a 6 French bulbar urethral stricture.  He had mild to moderate urinary symptoms at that time.  He underwent uncomplicated cystoscopy with balloon dilation of his urethral stricture on August 16, 2018.  Since then, he has been doing very well and reports strong stream and no urinary complaints.  He denies any gross hematuria or dysuria.  PLAN: Follow-up in 9 to 12 months with [facility].  If clinically doing well, will not repeat clinic cystoscopy or RUG.  Please note that greater than 15 minutes were spent with the patient today, greater than 50% were spent in direct patient education and counseling regarding urethral stricture disease, recurrence, and urinary symptoms.   [name], [facility] [address], [place], [place] [address] [phone]

## 2018-10-10 DIAGNOSIS — C884 Extranodal marginal zone B-cell lymphoma of mucosa-associated lymphoid tissue [MALT-lymphoma]: Secondary | ICD-10-CM | POA: Diagnosis not present

## 2018-10-11 DIAGNOSIS — I2510 Atherosclerotic heart disease of native coronary artery without angina pectoris: Secondary | ICD-10-CM

## 2018-10-11 HISTORY — DX: Atherosclerotic heart disease of native coronary artery without angina pectoris: I25.10

## 2018-10-17 ENCOUNTER — Telehealth: Payer: Self-pay | Admitting: Internal Medicine

## 2018-10-17 NOTE — Telephone Encounter (Signed)
lmov to r/s stress test due to treadmill not working.  Will attempt to contact again tomorrow.

## 2018-10-28 ENCOUNTER — Ambulatory Visit (INDEPENDENT_AMBULATORY_CARE_PROVIDER_SITE_OTHER): Payer: Medicare Other

## 2018-10-28 DIAGNOSIS — Z23 Encounter for immunization: Secondary | ICD-10-CM | POA: Diagnosis not present

## 2018-10-30 ENCOUNTER — Inpatient Hospital Stay: Payer: Medicare Other | Attending: Internal Medicine | Admitting: Internal Medicine

## 2018-10-30 ENCOUNTER — Inpatient Hospital Stay: Payer: Medicare Other

## 2018-10-30 ENCOUNTER — Encounter: Payer: Self-pay | Admitting: Internal Medicine

## 2018-10-30 DIAGNOSIS — Z800 Family history of malignant neoplasm of digestive organs: Secondary | ICD-10-CM | POA: Diagnosis not present

## 2018-10-30 DIAGNOSIS — Z86010 Personal history of colonic polyps: Secondary | ICD-10-CM | POA: Insufficient documentation

## 2018-10-30 DIAGNOSIS — I10 Essential (primary) hypertension: Secondary | ICD-10-CM | POA: Diagnosis not present

## 2018-10-30 DIAGNOSIS — C884 Extranodal marginal zone b-cell lymphoma of mucosa-associated lymphoid tissue (malt-lymphoma) not having achieved remission: Secondary | ICD-10-CM | POA: Insufficient documentation

## 2018-10-30 DIAGNOSIS — K2270 Barrett's esophagus without dysplasia: Secondary | ICD-10-CM | POA: Insufficient documentation

## 2018-10-30 LAB — CBC WITH DIFFERENTIAL/PLATELET
Abs Immature Granulocytes: 0.02 10*3/uL (ref 0.00–0.07)
BASOS ABS: 0.1 10*3/uL (ref 0.0–0.1)
BASOS PCT: 1 %
EOS ABS: 0.1 10*3/uL (ref 0.0–0.5)
EOS PCT: 1 %
HCT: 44.4 % (ref 39.0–52.0)
HEMOGLOBIN: 15 g/dL (ref 13.0–17.0)
Immature Granulocytes: 0 %
LYMPHS PCT: 24 %
Lymphs Abs: 2.4 10*3/uL (ref 0.7–4.0)
MCH: 32.7 pg (ref 26.0–34.0)
MCHC: 33.8 g/dL (ref 30.0–36.0)
MCV: 96.7 fL (ref 80.0–100.0)
MONO ABS: 1.1 10*3/uL — AB (ref 0.1–1.0)
Monocytes Relative: 12 %
NRBC: 0 % (ref 0.0–0.2)
Neutro Abs: 6 10*3/uL (ref 1.7–7.7)
Neutrophils Relative %: 62 %
Platelets: 287 10*3/uL (ref 150–400)
RBC: 4.59 MIL/uL (ref 4.22–5.81)
RDW: 12.7 % (ref 11.5–15.5)
WBC: 9.7 10*3/uL (ref 4.0–10.5)

## 2018-10-30 LAB — COMPREHENSIVE METABOLIC PANEL
ALK PHOS: 58 U/L (ref 38–126)
ALT: 54 U/L — AB (ref 0–44)
ANION GAP: 10 (ref 5–15)
AST: 48 U/L — ABNORMAL HIGH (ref 15–41)
Albumin: 4.6 g/dL (ref 3.5–5.0)
BUN: 21 mg/dL (ref 8–23)
CALCIUM: 9.5 mg/dL (ref 8.9–10.3)
CO2: 25 mmol/L (ref 22–32)
CREATININE: 0.96 mg/dL (ref 0.61–1.24)
Chloride: 101 mmol/L (ref 98–111)
Glucose, Bld: 100 mg/dL — ABNORMAL HIGH (ref 70–99)
Potassium: 3.6 mmol/L (ref 3.5–5.1)
Sodium: 136 mmol/L (ref 135–145)
TOTAL PROTEIN: 7.7 g/dL (ref 6.5–8.1)
Total Bilirubin: 0.9 mg/dL (ref 0.3–1.2)

## 2018-10-30 LAB — LACTATE DEHYDROGENASE: LDH: 183 U/L (ref 98–192)

## 2018-10-30 NOTE — Assessment & Plan Note (Addendum)
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  Unusual site.  #I had a long discussion with patient regarding-above diagnosis/pathology.  Would recommend a PET scan for further evaluation/staging.  #Discussed that in general low-grade lymphomas are not treated with active systemic therapies unless symptomatic.  If symptomatic-radiation/systemic therapies like Rituxan based are recommended.  It is very unlikely patient will need any aggressive systemic therapies.  We will also discussed the tumor conference.  #Multiple colon polyps-surveillance as per GI.  #Barrett's esophagus-surveillance as per GI.   Thank you Dr. Gustavo Lah for allowing me to participate in the care of your pleasant patient. Please do not hesitate to contact me with questions or concerns in the interim.  # 60 minutes face-to-face with the patient discussing the above plan of care; more than 50% of time spent on prognosis/ natural history; counseling and coordination.   DISPOSITION: Labs today-CBC CMP hepatitis panel LDH monoclonal work-up. Follow up in 2 weeks/ PET prior- no labs-Dr.B

## 2018-10-30 NOTE — Progress Notes (Signed)
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  male has been referred to [place] for further evaluation recommendations for diagnosis of lymphoma.  Patient states that he has history of colonic polyps for which he undergoes routine screening colonoscopies.  However the most recent done-had multiple polyps however "one polyp"-lymphoma.  Patient denies any unusual weight loss or night sweats or lumps or bumps.  Appetite is good.   Review of Systems  Constitutional: Negative for chills, diaphoresis, fever, malaise/fatigue and weight loss.  HENT: Negative for nosebleeds and sore throat.   Eyes: Negative for double vision.  Respiratory: Negative for cough, hemoptysis, sputum production, shortness of breath and wheezing.   Cardiovascular: Negative for chest pain, palpitations, orthopnea and leg swelling.  Gastrointestinal: Negative for abdominal pain, blood in stool, constipation, diarrhea, heartburn, melena, nausea and vomiting.  Genitourinary: Negative for dysuria, frequency and urgency.  Musculoskeletal: Negative for back pain and joint pain.  Skin: Negative.  Negative for itching and rash.  Neurological: Negative for dizziness, tingling, focal weakness, weakness and headaches.   Endo/Heme/Allergies: Does not bruise/bleed easily.  Psychiatric/Behavioral: Negative for depression. The patient is not nervous/anxious and does not have insomnia.      MEDICAL HISTORY:  Past Medical History:  Diagnosis Date  . Anxiety   . Barrett's esophagus   . Duodenal adenoma   . GERD (gastroesophageal reflux disease)   . Gout   . H/O urethral stricture   . Hemorrhoids   . Hypertension   . Stomach ulcer     SURGICAL HISTORY: Past Surgical History:  Procedure Laterality Date  . COLONOSCOPY    . COLONOSCOPY WITH PROPOFOL N/A 08/20/2018   Procedure: COLONOSCOPY WITH PROPOFOL;  Surgeon: Lollie Sails, MD;  Location: [facility] ENDOSCOPY;  Service: Endoscopy;  Laterality: N/A;  . CYSTOSCOPY WITH URETHRAL DILATATION N/A 08/16/2018   Procedure: CYSTOSCOPY WITH URETHRAL DILATATION;  Surgeon: [name], MD;  Location: [facility] ORS;  Service: Urology;  Laterality: N/A;  . ESOPHAGOGASTRODUODENOSCOPY N/A 01/29/2017   Procedure: ESOPHAGOGASTRODUODENOSCOPY (EGD);  Surgeon: Lollie Sails, MD;  Location: [facility] ENDOSCOPY;  Service: Endoscopy;  Laterality: N/A;  . ESOPHAGOGASTRODUODENOSCOPY (EGD) WITH PROPOFOL N/A 07/10/2016   Procedure: ESOPHAGOGASTRODUODENOSCOPY (EGD) WITH PROPOFOL;  Surgeon: Lollie Sails, MD;  Location: [facility] ENDOSCOPY;  Service: Endoscopy;  Laterality: N/A;  . gastritis    . h.pylori    . left elbow repair Left   . SPLENECTOMY  1957  . TONSILLECTOMY      SOCIAL HISTORY: Social History   Socioeconomic History  . Marital status: Married    Spouse name: Not on file  . Number of children: Not on file  . Years of education: Not on file  . Highest education level: Not on file  Occupational History  .  Not on file  Social Needs  . Financial resource strain: Not hard at all  . Food insecurity:    Worry: Never true    Inability: Never true  . Transportation needs:    Medical: No    Non-medical: No  Tobacco Use  . Smoking status: Never Smoker  . Smokeless  tobacco: Never Used  Substance and Sexual Activity  . Alcohol use: Yes    Alcohol/week: 5.0 standard drinks    Types: 5 Glasses of wine per week  . Drug use: No  . Sexual activity: Not on file  Lifestyle  . Physical activity:    Days per week: 4 days    Minutes per session: 60 min  . Stress: Not at all  Relationships  . Social connections:    Talks on phone: More than three times a week    Gets together: More than three times a week    Attends religious service: More than 4 times per year    Active member of club or organization: Yes    Attends meetings of clubs or organizations: More than 4 times per year    Relationship status: Married  . Intimate partner violence:    Fear of current or ex partner: No    Emotionally abused: No    Physically abused: No    Forced sexual activity: No  Other Topics Concern  . Not on file  Social History Narrative   [place] [employer].      No smoking.  Occasional alcohol.  Lives at home.  Retired Customer service manager.    FAMILY HISTORY: Family History  Adopted: Yes  Problem Relation Age of Onset  . Heart attack Mother 41  . Pancreatic cancer Father   . Hypertension Sister   . Hyperlipidemia Sister   . Hypertension Brother   . Hyperlipidemia Brother   . Prostate cancer Neg Hx   . Kidney cancer Neg Hx   . Bladder Cancer Neg Hx     ALLERGIES:  has No Known Allergies.  MEDICATIONS:  Current Outpatient Medications  Medication Sig Dispense Refill  . amLODipine (NORVASC) 10 MG tablet Take 1 tablet (10 mg total) by mouth daily. 90 tablet 4  . aspirin EC 81 MG tablet Take 81 mg by mouth daily.    . febuxostat (ULORIC) 40 MG tablet Take 1 tablet (40 mg total) by mouth daily. 90 tablet 4  . LORazepam (ATIVAN) 1 MG tablet Take 0.5-1 tablets (0.5-1 mg total) by mouth daily as needed for anxiety. 30 tablet 1  . losartan-hydrochlorothiazide (HYZAAR) 100-25 MG tablet Take 1 tablet by mouth daily. 90 tablet 4  . metoprolol tartrate (LOPRESSOR) 25 MG  tablet Take 1 tablet (25 mg total) by mouth 2 (two) times daily. 180 tablet 4  . pantoprazole (PROTONIX) 20 MG tablet Take 1 tablet (20 mg total) by mouth 2 (two) times daily. (Patient taking differently: Take 20 mg by mouth daily. ) 180 tablet 4   No current facility-administered medications for this visit.       Marland Kitchen  PHYSICAL EXAMINATION: ECOG PERFORMANCE STATUS: 0 - Asymptomatic  Vitals:   10/30/18 1527  BP: (!) 144/96  Pulse: 87  Resp: 16  Temp: (!) 97.1 F (36.2 C)   Filed Weights   10/30/18 1527  Weight: 261 lb (118.4 kg)    Physical Exam  Constitutional: He is oriented to person, place, and time and well-developed, well-nourished, and in no distress.  HENT:  Head: Normocephalic and atraumatic.  Mouth/Throat: Oropharynx  is clear and moist. No oropharyngeal exudate.  Eyes: Pupils are equal, round, and reactive to light.  Neck: Normal range of motion. Neck supple.  Cardiovascular: Normal rate and regular rhythm.  Pulmonary/Chest: No respiratory distress. He has no wheezes.  Abdominal: Soft. Bowel sounds are normal. He exhibits no distension and no mass. There is no tenderness. There is no rebound and no guarding.  Musculoskeletal: Normal range of motion. He exhibits no edema or tenderness.  Neurological: He is alert and oriented to person, place, and time.  Skin: Skin is warm.  Psychiatric: Affect normal.     LABORATORY DATA:  I have reviewed the data as listed Lab Results  Component Value Date   WBC 9.7 10/30/2018   HGB 15.0 10/30/2018   HCT 44.4 10/30/2018   MCV 96.7 10/30/2018   PLT 287 10/30/2018   Recent Labs    06/26/18 1121 07/03/18 1349 07/18/18 1330 10/30/18 1608  NA 142 137  --  136  K 3.8 3.5  --  3.6  CL 99 98  --  101  CO2 28 30  --  25  GLUCOSE 117* 125*  --  100*  BUN 16 19 17 21   CREATININE 1.09 1.16 1.13 0.96  CALCIUM 9.9 9.3  --  9.5  GFRNONAA 67 >60 64 >60  GFRAA 77 >60 74 >60  PROT 6.6  --   --  7.7  ALBUMIN 4.3  --   --  4.6   AST 37  --   --  48*  ALT 41  --   --  54*  ALKPHOS 58  --   --  58  BILITOT 0.7  --   --  0.9    RADIOGRAPHIC STUDIES: I have personally reviewed the radiological images as listed and agreed with the findings in the report. No results found.  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  Unusual site.  #I had a long discussion with patient regarding-above diagnosis/pathology.  Would recommend a PET scan for further evaluation/staging.  #Discussed that in general low-grade lymphomas are not treated with active systemic therapies unless symptomatic.  If symptomatic-radiation/systemic therapies like Rituxan based are recommended.  It is very unlikely patient will need any aggressive systemic therapies.  We will also discussed the tumor conference.  #Multiple colon polyps-surveillance as per GI.  #Barrett's esophagus-surveillance as per GI.   Thank you Dr. Gustavo Lah for allowing me to participate in the care of your pleasant patient. Please do not hesitate to contact me with questions or concerns in the interim.  # 60 minutes face-to-face with the patient discussing the above plan of care; more than 50% of time spent on prognosis/ natural history; counseling and coordination.   DISPOSITION: Labs today-CBC CMP hepatitis panel LDH monoclonal work-up. Follow up in 2 weeks/ PET prior- no labs-Dr.B    All questions were answered. The patient knows to call the clinic with any problems, questions or concerns.       Cammie Sickle, MD 10/30/2018 4:44 PM

## 2018-10-31 LAB — HEPATITIS C ANTIBODY: HCV Ab: <0.1 s/co ratio (ref 0.0–0.9)

## 2018-10-31 LAB — HEPATITIS B SURFACE ANTIGEN: Hepatitis B Surface Ag: NEGATIVE

## 2018-10-31 LAB — HEPATITIS B SURFACE ANTIBODY, QUANTITATIVE: Hepatitis B-Post: <3.1 m[IU]/mL — ABNORMAL LOW (ref >9.9)

## 2018-11-01 LAB — MULTIPLE MYELOMA PANEL, SERUM
ALBUMIN/GLOB SERPL: 1.6 (ref 0.7–1.7)
ALPHA 1: 0.2 g/dL (ref 0.0–0.4)
Albumin SerPl Elph-Mcnc: 4.3 g/dL (ref 2.9–4.4)
Alpha2 Glob SerPl Elph-Mcnc: 0.8 g/dL (ref 0.4–1.0)
B-GLOBULIN SERPL ELPH-MCNC: 1.2 g/dL (ref 0.7–1.3)
Gamma Glob SerPl Elph-Mcnc: 0.5 g/dL (ref 0.4–1.8)
Globulin, Total: 2.7 g/dL (ref 2.2–3.9)
IGM (IMMUNOGLOBULIN M), SRM: 38 mg/dL (ref 15–143)
IgA: 451 mg/dL — ABNORMAL HIGH (ref 61–437)
IgG (Immunoglobin G), Serum: 671 mg/dL — ABNORMAL LOW (ref 700–1600)
TOTAL PROTEIN ELP: 7 g/dL (ref 6.0–8.5)

## 2018-11-04 IMAGING — CR DG CHEST 2V
1 series · 2 of 2 positions shown · non-contrast
Comparison: 02/09/2016

CLINICAL DATA: 72-year-old with left-sided chest pain and left arm
weakness.

EXAM:
CHEST - 2 VIEW

[Series 1: dg chest 2 view · 0.14mm/px · 2 of 2 slices shown]
[im 1/2]
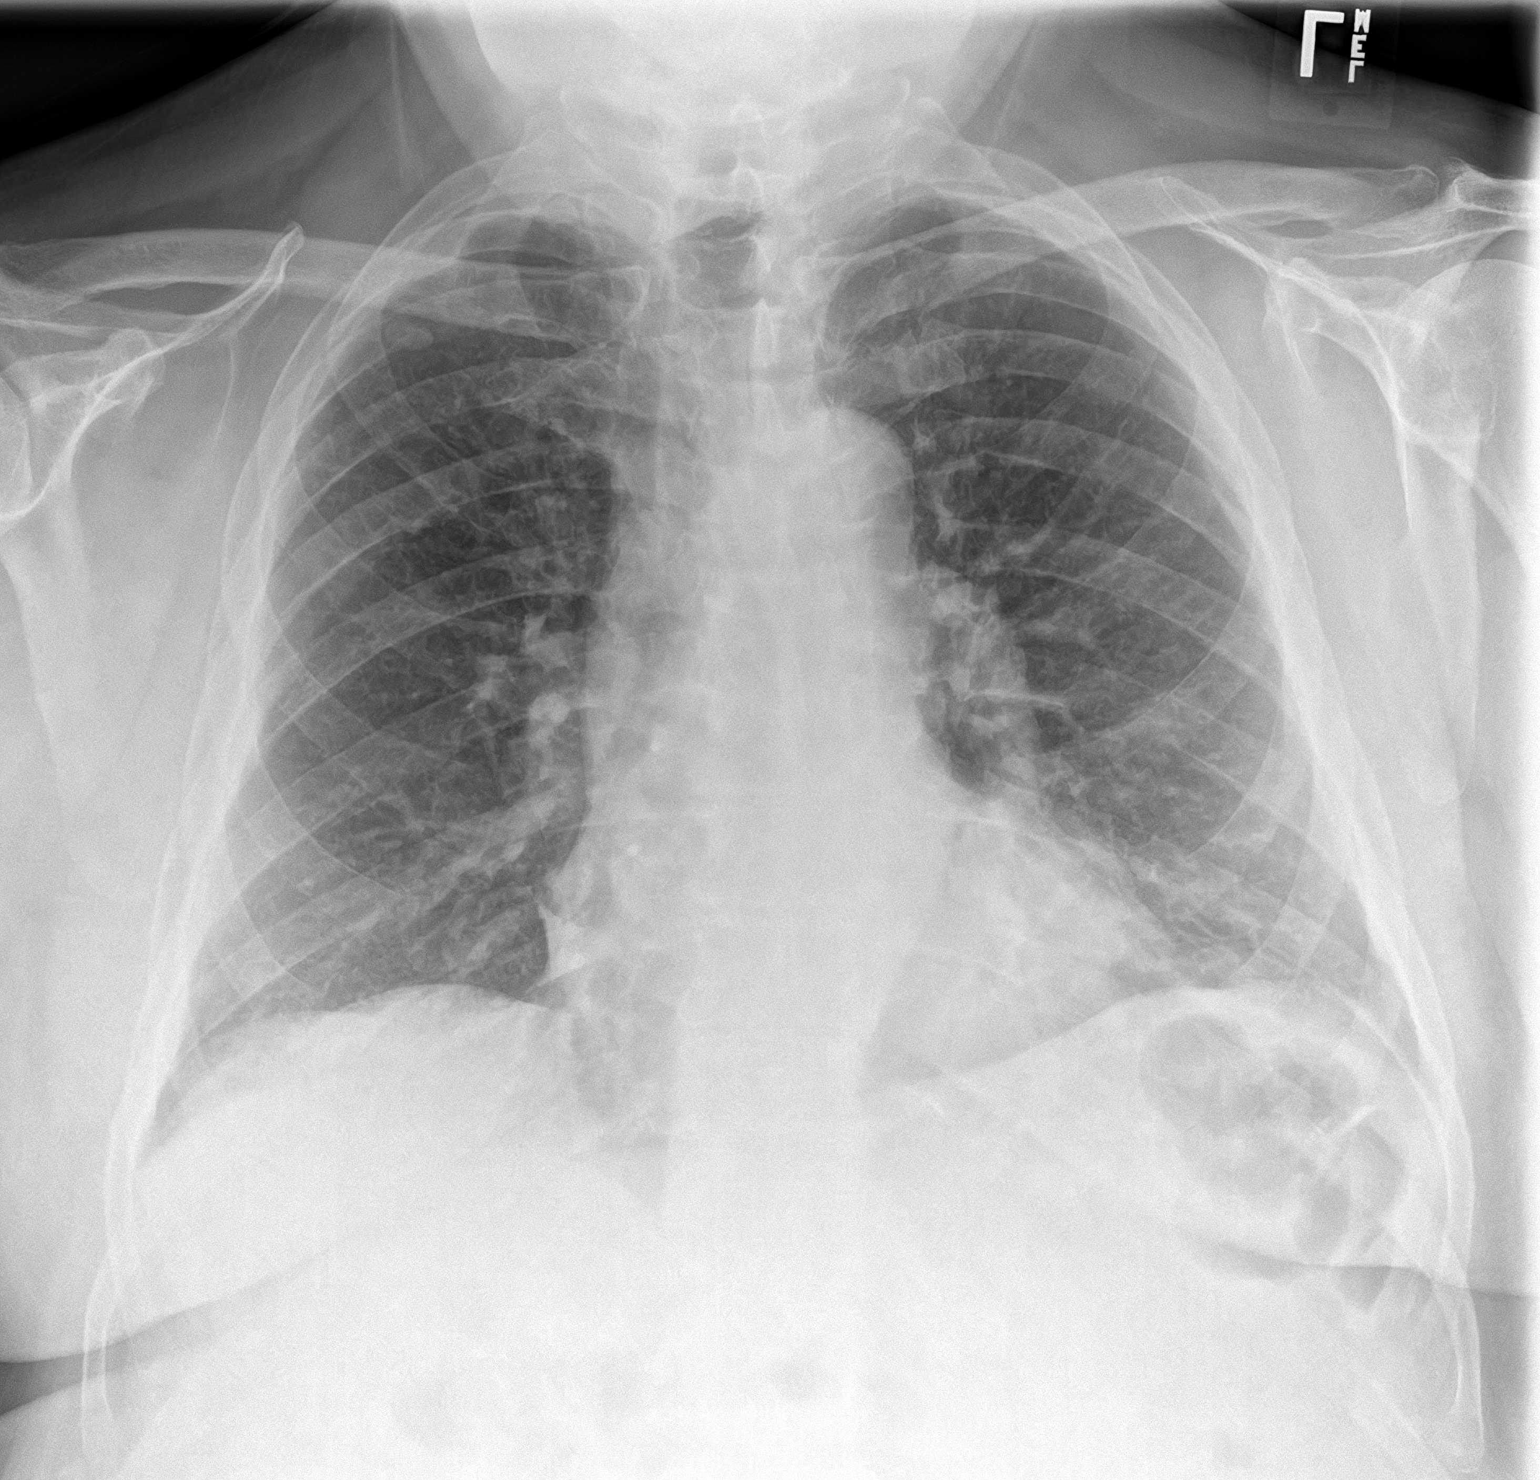
[im 2/2]
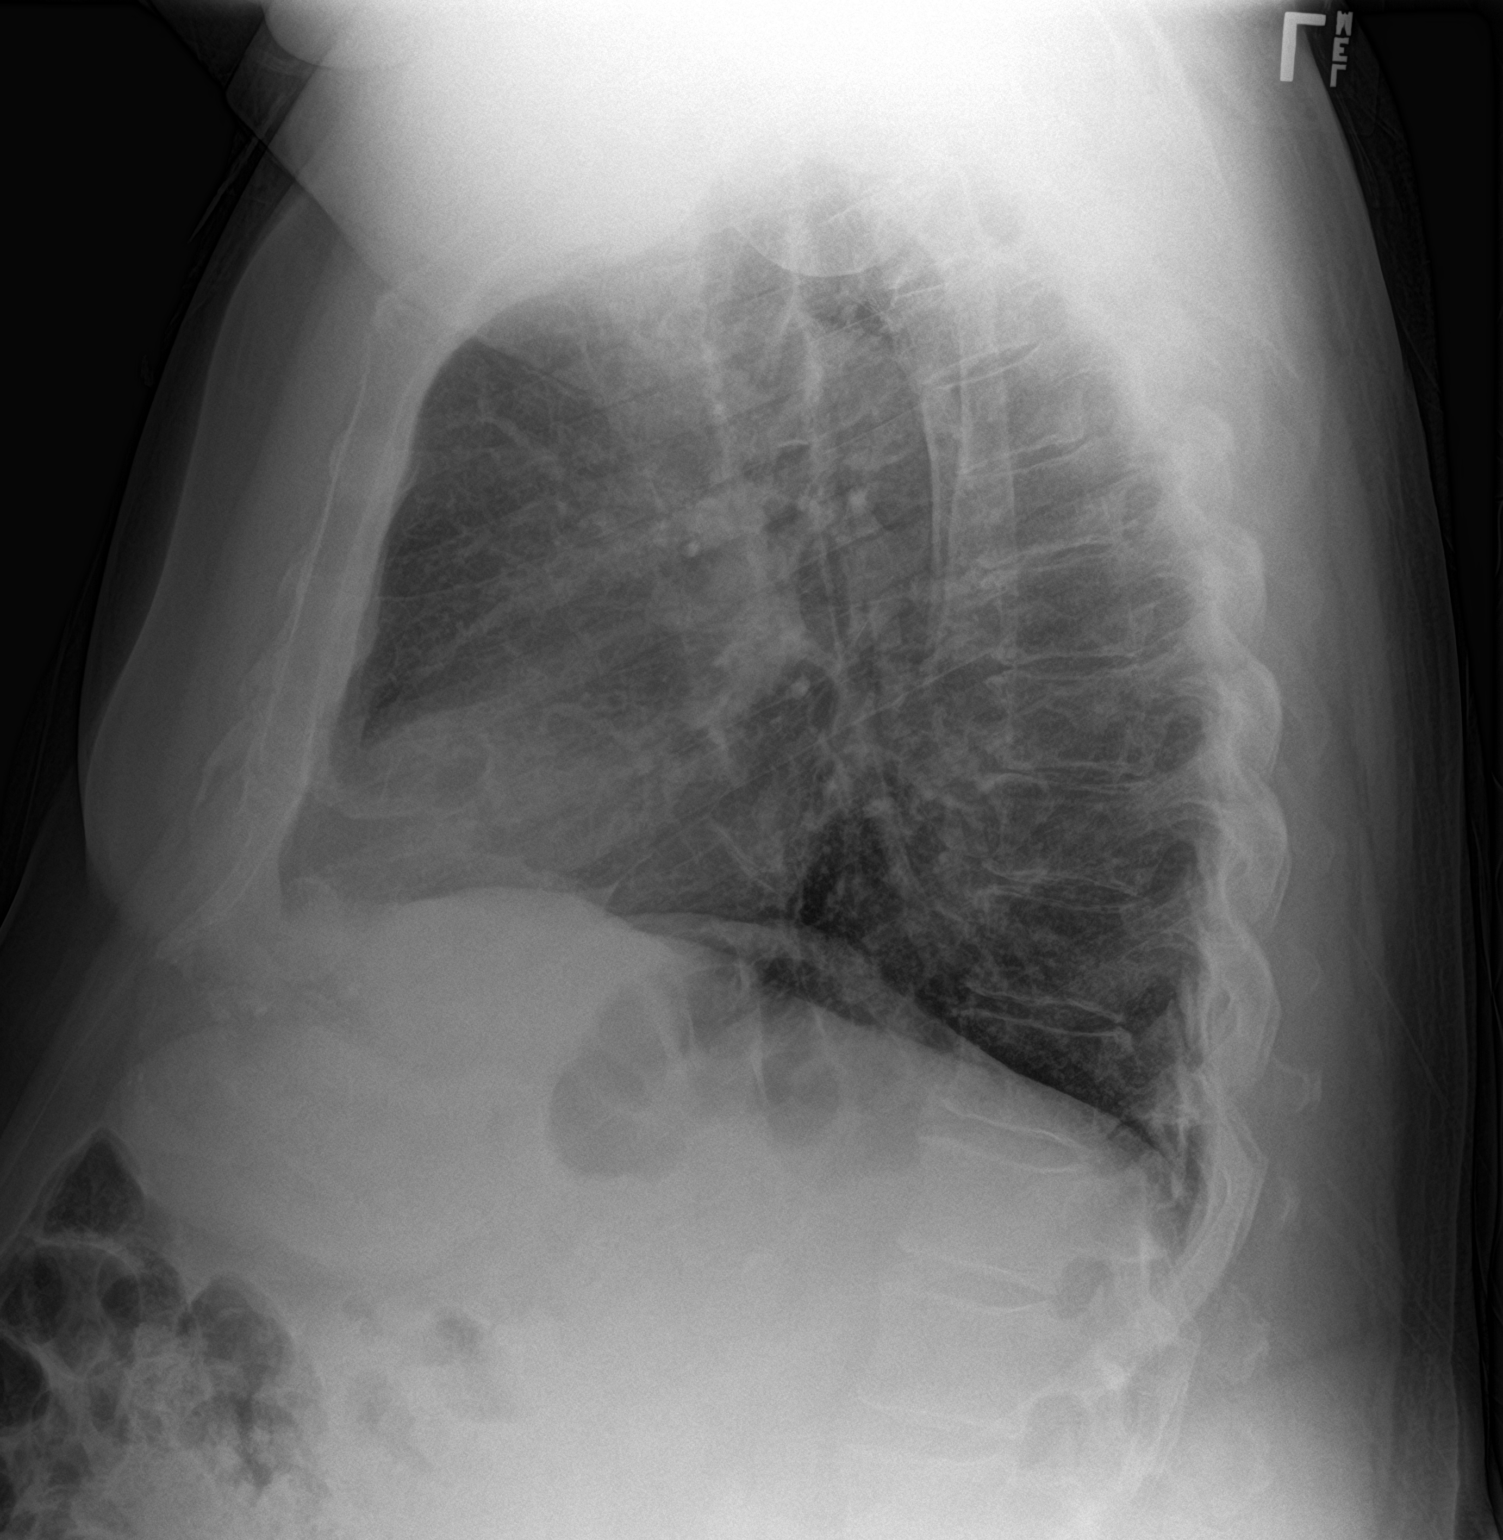

[2 of 2 positions shown; findings below may reference images not displayed]

FINDINGS: Few densities at the left lung base may represent atelectasis.
Otherwise, the lungs are clear without airspace disease or pulmonary
edema. Small adjacent nodular structures in the right upper chest
are probably outside of the patient. Heart and mediastinum are
within normal limits. The trachea is midline.
IMPRESSION: No active cardiopulmonary disease.

Nodular densities in the right upper chest are likely external to
the patient.

## 2018-11-05 ENCOUNTER — Telehealth: Payer: Self-pay | Admitting: *Deleted

## 2018-11-05 NOTE — Telephone Encounter (Signed)
Left a message for the patient to call back for a reminder of instructions for the GXT tomorrow 11/27:   DO NOT drink or eat foods with caffeine for 24 hours before the test. (Chocolate, coffee, tea, decaf coffee/tea, or energy drinks)  DO NOT smoke for 4 hours before your test.  If you use an inhaler, bring it with you to the test.  Wear comfortable shoes and clothing.  DO NOT TAKE METOPROLOL THE NIGHT BEFORE OR MORNING OF.

## 2018-11-06 ENCOUNTER — Ambulatory Visit (INDEPENDENT_AMBULATORY_CARE_PROVIDER_SITE_OTHER): Payer: Medicare Other

## 2018-11-06 DIAGNOSIS — R0789 Other chest pain: Secondary | ICD-10-CM | POA: Diagnosis not present

## 2018-11-06 LAB — EXERCISE TOLERANCE TEST
CHL CUP RESTING HR STRESS: 102 {beats}/min
CSEPED: 7 min
CSEPHR: 94 %
Estimated workload: 9.1 METS
Exercise duration (sec): 24 s
MPHR: 147 {beats}/min
Peak HR: 139 {beats}/min
RPE: 13

## 2018-11-11 ENCOUNTER — Encounter: Payer: Self-pay | Admitting: *Deleted

## 2018-11-11 ENCOUNTER — Telehealth: Payer: Self-pay | Admitting: *Deleted

## 2018-11-11 ENCOUNTER — Encounter
Admission: RE | Admit: 2018-11-11 | Discharge: 2018-11-11 | Disposition: A | Discharge status: Home / Self Care | Payer: Medicare Other | Source: Ambulatory Visit | Attending: Internal Medicine | Admitting: Internal Medicine

## 2018-11-11 DIAGNOSIS — C884 Extranodal marginal zone B-cell lymphoma of mucosa-associated lymphoid tissue [MALT-lymphoma]: Secondary | ICD-10-CM | POA: Diagnosis not present

## 2018-11-11 DIAGNOSIS — C8512 Unspecified B-cell lymphoma, intrathoracic lymph nodes: Secondary | ICD-10-CM | POA: Diagnosis not present

## 2018-11-11 DIAGNOSIS — R9439 Abnormal result of other cardiovascular function study: Secondary | ICD-10-CM

## 2018-11-11 DIAGNOSIS — R079 Chest pain, unspecified: Secondary | ICD-10-CM

## 2018-11-11 DIAGNOSIS — R0789 Other chest pain: Secondary | ICD-10-CM

## 2018-11-11 LAB — GLUCOSE, CAPILLARY: GLUCOSE-CAPILLARY: 122 mg/dL — AB (ref 70–99)

## 2018-11-11 MED ORDER — FLUDEOXYGLUCOSE F - 18 (FDG) INJECTION
13.5400 | Freq: Once | INTRAVENOUS | Status: AC | PRN
  Administered 2018-11-11: 13.54 via INTRAVENOUS

## 2018-11-11 NOTE — Telephone Encounter (Signed)
No answer. Left message to call back.   

## 2018-11-11 NOTE — Telephone Encounter (Signed)
-----   Message from Nelva Bush, MD sent at 11/11/2018  7:05 AM EST ----- Please let Jesse Norton know that his exercise tolerance test was abnormal with subtle changes on the EKG portion of the test as well as frequent extra beats (PVC's) during stress.  I recommend that we obtain a cardiac CTA for evaluation of chest pain and abnormal stress test.  I can call him tomorrow to discuss the results in more detail if questions come up.

## 2018-11-11 NOTE — Telephone Encounter (Signed)
Results called to pt. Pt verbalized understanding. He is agreeable to the Cardiac CTA. He verbalized understanding of the following instructions and letter with instructions mailed to patient as listed below. If he does not hear anything from scheduling in 2-3 weeks, he will give [place] a call. Message sent to pre-cert and scheduling.   Please arrive at the [facility] main entrance of [facility] at xx:xx AM (30-45 minutes prior to test start time)  [facility] [place], [place] [address] [phone]  Proceed to the [facility] Radiology Department (First Floor).  Please follow these instructions carefully (unless otherwise directed):  Hold all erectile dysfunction medications at least 48 hours prior to test.  On the Night Before the Test: . Be sure to Drink plenty of water. . Do not consume any caffeinated/decaffeinated beverages or chocolate 12 hours prior to your test. . Do not take any antihistamines 12 hours prior to your test.   On the Day of the Test: . Drink plenty of water. Do not drink any water within one hour of the test. . Do not eat any food 4 hours prior to the test. . You may take your regular medications prior to the test.  . Take metoprolol (Lopressor) two hours prior to test. . HOLD Furosemide/Hydrochlorothiazide morning of the test.       After the Test: . Drink plenty of water. . After receiving IV contrast, you may experience a mild flushed feeling. This is normal. . On occasion, you may experience a mild rash up to 24 hours after the test. This is not dangerous. If this occurs, you can take Benadryl 25 mg and increase your fluid intake. . If you experience trouble breathing, this can be serious. If it is severe call 911 IMMEDIATELY. If it is mild, please call our office.

## 2018-11-11 NOTE — Telephone Encounter (Signed)
Patient returning our call ° °Please call back ° °

## 2018-11-14 ENCOUNTER — Encounter: Payer: Self-pay | Admitting: Internal Medicine

## 2018-11-14 ENCOUNTER — Inpatient Hospital Stay: Payer: Medicare Other | Attending: Internal Medicine | Admitting: Internal Medicine

## 2018-11-14 ENCOUNTER — Other Ambulatory Visit: Payer: Medicare Other

## 2018-11-14 VITALS — BP 154/95 | HR 87 | Resp 16 | Wt 260.0 lb

## 2018-11-14 DIAGNOSIS — I10 Essential (primary) hypertension: Secondary | ICD-10-CM | POA: Diagnosis not present

## 2018-11-14 DIAGNOSIS — Z86010 Personal history of colonic polyps: Secondary | ICD-10-CM | POA: Diagnosis not present

## 2018-11-14 DIAGNOSIS — Z9081 Acquired absence of spleen: Secondary | ICD-10-CM | POA: Diagnosis not present

## 2018-11-14 DIAGNOSIS — K2270 Barrett's esophagus without dysplasia: Secondary | ICD-10-CM | POA: Diagnosis not present

## 2018-11-14 DIAGNOSIS — C884 Extranodal marginal zone B-cell lymphoma of mucosa-associated lymphoid tissue [MALT-lymphoma]: Secondary | ICD-10-CM | POA: Diagnosis not present

## 2018-11-14 DIAGNOSIS — Z800 Family history of malignant neoplasm of digestive organs: Secondary | ICD-10-CM | POA: Insufficient documentation

## 2018-11-14 NOTE — Progress Notes (Signed)
[place] NOTE  Patient Care Team: [name], MD as PCP - General (Family Medicine) Lollie Sails, MD as Consulting Physician (Gastroenterology)  CHIEF COMPLAINTS/PURPOSE OF CONSULTATION:  Lymphoma  #  Oncology History   # NOV 2019- MALT [II opinion at [facility] path]; s/p sigmoid colon polyp resection [Dr.Skulskie; incidental]; PET scan -NED  # Barrett's esophagus- 2018;#Colonic polyps surveillance/Dr. 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  male has been referred to [place] for further evaluation recommendations for diagnosis of lymphoma.  Patient states that he has history of colonic polyps for which he undergoes routine screening colonoscopies.  However the most recent done-had multiple polyps however "one polyp"-lymphoma.  Patient denies any unusual weight loss or night sweats or lumps or bumps.  Appetite is good.   Review of Systems  Constitutional: Negative for chills, diaphoresis, fever, malaise/fatigue and weight loss.  HENT: Negative for nosebleeds and sore throat.   Eyes: Negative for double vision.  Respiratory: Negative for cough, hemoptysis, sputum production, shortness of breath and wheezing.   Cardiovascular: Negative for chest pain, palpitations, orthopnea and leg swelling.  Gastrointestinal: Negative for abdominal pain, blood in stool, constipation, diarrhea, heartburn, melena, nausea and vomiting.  Genitourinary: Negative for dysuria, frequency and urgency.   Musculoskeletal: Negative for back pain and joint pain.  Skin: Negative.  Negative for itching and rash.  Neurological: Negative for dizziness, tingling, focal weakness, weakness and headaches.  Endo/Heme/Allergies: Does not bruise/bleed easily.  Psychiatric/Behavioral: Negative for depression. The patient is not nervous/anxious and does not have insomnia.      MEDICAL HISTORY:  Past Medical History:  Diagnosis Date  . Anxiety   . Barrett's esophagus   . Duodenal adenoma   . GERD (gastroesophageal reflux disease)   . Gout   . H/O urethral stricture   . Hemorrhoids   . Hypertension   . Stomach ulcer     SURGICAL HISTORY: Past Surgical History:  Procedure Laterality Date  . COLONOSCOPY    . COLONOSCOPY WITH PROPOFOL N/A 08/20/2018   Procedure: COLONOSCOPY WITH PROPOFOL;  Surgeon: Lollie Sails, MD;  Location: [facility] ENDOSCOPY;  Service: Endoscopy;  Laterality: N/A;  . CYSTOSCOPY WITH URETHRAL DILATATION N/A 08/16/2018   Procedure: CYSTOSCOPY WITH URETHRAL DILATATION;  Surgeon: [name], MD;  Location: [facility] ORS;  Service: Urology;  Laterality: N/A;  . ESOPHAGOGASTRODUODENOSCOPY N/A 01/29/2017   Procedure: ESOPHAGOGASTRODUODENOSCOPY (EGD);  Surgeon: Lollie Sails, MD;  Location: [facility] ENDOSCOPY;  Service: Endoscopy;  Laterality: N/A;  . ESOPHAGOGASTRODUODENOSCOPY (EGD) WITH PROPOFOL N/A 07/10/2016   Procedure: ESOPHAGOGASTRODUODENOSCOPY (EGD) WITH PROPOFOL;  Surgeon: Lollie Sails, MD;  Location: [facility] ENDOSCOPY;  Service: Endoscopy;  Laterality: N/A;  . gastritis    . h.pylori    . left elbow repair Left   . SPLENECTOMY  1957  . TONSILLECTOMY      SOCIAL HISTORY: Social History   Socioeconomic History  . Marital status: Married    Spouse name: Not on file  .  Number of children: Not on file  . Years of education: Not on file  . Highest education level: Not on file  Occupational History  . Not on file  Social Needs  . Financial resource strain: Not hard at  all  . Food insecurity:    Worry: Never true    Inability: Never true  . Transportation needs:    Medical: No    Non-medical: No  Tobacco Use  . Smoking status: Never Smoker  . Smokeless tobacco: Never Used  Substance and Sexual Activity  . Alcohol use: Yes    Alcohol/week: 5.0 standard drinks    Types: 5 Glasses of wine per week  . Drug use: No  . Sexual activity: Not on file  Lifestyle  . Physical activity:    Days per week: 4 days    Minutes per session: 60 min  . Stress: Not at all  Relationships  . Social connections:    Talks on phone: More than three times a week    Gets together: More than three times a week    Attends religious service: More than 4 times per year    Active member of club or organization: Yes    Attends meetings of clubs or organizations: More than 4 times per year    Relationship status: Married  . Intimate partner violence:    Fear of current or ex partner: No    Emotionally abused: No    Physically abused: No    Forced sexual activity: No  Other Topics Concern  . Not on file  Social History Narrative   [employer].      No smoking.  Occasional alcohol.  Lives at home.  Retired Customer service manager.    FAMILY HISTORY: Family History  Adopted: Yes  Problem Relation Age of Onset  . Heart attack Mother 35  . Pancreatic cancer Father   . Hypertension Sister   . Hyperlipidemia Sister   . Hypertension Brother   . Hyperlipidemia Brother   . Prostate cancer Neg Hx   . Kidney cancer Neg Hx   . Bladder Cancer Neg Hx     ALLERGIES:  has No Known Allergies.  MEDICATIONS:  Current Outpatient Medications  Medication Sig Dispense Refill  . amLODipine (NORVASC) 10 MG tablet Take 1 tablet (10 mg total) by mouth daily. 90 tablet 4  . aspirin EC 81 MG tablet Take 81 mg by mouth daily.    . febuxostat (ULORIC) 40 MG tablet Take 1 tablet (40 mg total) by mouth daily. 90 tablet 4  . LORazepam (ATIVAN) 1 MG tablet Take 0.5-1 tablets (0.5-1 mg total) by  mouth daily as needed for anxiety. 30 tablet 1  . losartan-hydrochlorothiazide (HYZAAR) 100-25 MG tablet Take 1 tablet by mouth daily. 90 tablet 4  . metoprolol tartrate (LOPRESSOR) 25 MG tablet Take 1 tablet (25 mg total) by mouth 2 (two) times daily. 180 tablet 4  . pantoprazole (PROTONIX) 20 MG tablet Take 1 tablet (20 mg total) by mouth 2 (two) times daily. (Patient taking differently: Take 20 mg by mouth daily. ) 180 tablet 4  . polyethylene glycol-electrolytes (NULYTELY/GOLYTELY) 420 g solution USE AS DIRECTED FOR COLONOSCOPY  0   No current facility-administered medications for this visit.       Marland Kitchen  PHYSICAL EXAMINATION: ECOG PERFORMANCE STATUS: 0 - Asymptomatic  Vitals:   11/14/18 1047  BP: (!) 154/95  Pulse: 87  Resp: 16   Filed Weights   11/14/18 1047  Weight: 260  lb (117.9 kg)    Physical Exam  Constitutional: He is oriented to person, place, and time and well-developed, well-nourished, and in no distress.  HENT:  Head: Normocephalic and atraumatic.  Mouth/Throat: Oropharynx is clear and moist. No oropharyngeal exudate.  Eyes: Pupils are equal, round, and reactive to light.  Neck: Normal range of motion. Neck supple.  Cardiovascular: Normal rate and regular rhythm.  Pulmonary/Chest: No respiratory distress. He has no wheezes.  Abdominal: Soft. Bowel sounds are normal. He exhibits no distension and no mass. There is no tenderness. There is no rebound and no guarding.  Musculoskeletal: Normal range of motion. He exhibits no edema or tenderness.  Neurological: He is alert and oriented to person, place, and time.  Skin: Skin is warm.  Psychiatric: Affect normal.     LABORATORY DATA:  I have reviewed the data as listed Lab Results  Component Value Date   WBC 9.7 10/30/2018   HGB 15.0 10/30/2018   HCT 44.4 10/30/2018   MCV 96.7 10/30/2018   PLT 287 10/30/2018   Recent Labs    06/26/18 1121 07/03/18 1349 07/18/18 1330 10/30/18 1608  NA 142 137  --  136   K 3.8 3.5  --  3.6  CL 99 98  --  101  CO2 28 30  --  25  GLUCOSE 117* 125*  --  100*  BUN '16 19 17 21  '$ CREATININE 1.09 1.16 1.13 0.96  CALCIUM 9.9 9.3  --  9.5  GFRNONAA 67 >60 64 >60  GFRAA 77 >60 74 >60  PROT 6.6  --   --  7.7  ALBUMIN 4.3  --   --  4.6  AST 37  --   --  48*  ALT 41  --   --  54*  ALKPHOS 58  --   --  58  BILITOT 0.7  --   --  0.9    RADIOGRAPHIC STUDIES: I have personally reviewed the radiological images as listed and agreed with the findings in the report. Nm Pet Image Initial (pi) Skull Base To Thigh  Result Date: 11/11/2018 CLINICAL DATA:  Initial treatment strategy for malt lymphoma (low-grade B-cell) of the colon. EXAM: NUCLEAR MEDICINE PET SKULL BASE TO THIGH TECHNIQUE: 13.54 mCi F-18 FDG was injected intravenously. Full-ring PET imaging was performed from the skull base to thigh after the radiotracer. CT data was obtained and used for attenuation correction and anatomic localization. Fasting blood glucose: 122 mg/dl COMPARISON:  CT scan 08/05/2018 FINDINGS: Mediastinal blood pool activity: SUV max 2.74 NECK: No hypermetabolic lymph nodes in the neck. Incidental CT findings: Bilateral carotid artery calcifications are noted. CHEST: No hypermetabolic mediastinal or hilar nodes. No supraclavicular or axillary lymphadenopathy. No suspicious pulmonary nodules on the CT scan. Incidental CT findings: Fairly extensive coronary artery calcifications. ABDOMEN/PELVIS: No abnormal hypermetabolic activity within the liver, pancreas, adrenal glands, or spleen. No hypermetabolic lymph nodes in the abdomen or pelvis. Do not see any definite colonic lesions or areas of worrisome hypermetabolism. Incidental CT findings: Moderate vascular calcifications. Status post splenectomy with numerous upper abdominal splenules. SKELETON: No focal hypermetabolic activity to suggest skeletal metastasis. Incidental CT findings: none IMPRESSION: Negative PET-CT. No findings suspicious for  metabolically active lymphoma. Electronically Signed   By: Marijo Sanes M.D.   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 PET NED.  #Again reviewed the pathology/imaging with the patient.  Labs within normal limits would not recommend a bone marrow biopsy.  Discussed surveillance would be mostly clinical.  We will plan to get a imaging again in 1 year.  We also reviewed the tumor conference.  #Mild elevation of LFTs AST ALT likely secondary to fatty liver.  Recommend weight loss.  #Multiple colon polyps/Barrett's surveillance as per GI.  # DISPOSITION: # follow up in 6 months/labs- cbc/cmp/ldh-Dr.B  # I reviewed the blood work- with the patient in detail; also reviewed the imaging independently [as summarized above]; and with the patient in detail.       All questions were answered. The patient knows to call the clinic with any problems, questions or concerns.       Cammie Sickle, MD 11/14/2018 11:10 AM

## 2018-11-14 NOTE — Progress Notes (Signed)
Tumor Board Documentation  Jesse Norton was presented by Dr Rogue Bussing at our Tumor Board on 11/14/2018, which included representatives from medical oncology, radiation oncology, internal medicine, navigation, pathology, radiology, surgical, research, pulmonology.  Jesse Norton currently presents as a new patient with history of the following treatments: surgical intervention(s).  Additionally, we reviewed previous medical and familial history, history of present illness, and recent lab results along with all available histopathologic and imaging studies. The tumor board considered available treatment options and made the following recommendations: Active surveillance    The following procedures/referrals were also placed: No orders of the defined types were placed in this encounter.   Clinical Trial Status: not discussed   Staging used: AJCC Stage Group  Stage 1 Lymphoma  National site-specific guidelines  NCCN were discussed with respect to the case.  Tumor board is a meeting of clinicians from various specialty areas who evaluate and discuss patients for whom a multidisciplinary approach is being considered. Final determinations in the plan of care are those of the provider(s). The responsibility for follow up of recommendations given during tumor board is that of the provider.   Today's extended care, comprehensive team conference, Jesse Norton was not present for the discussion and was not examined.   Multidisciplinary Tumor Board is a multidisciplinary case peer review process.  Decisions discussed in the Multidisciplinary Tumor Board reflect the opinions of the specialists present at the conference without having examined the patient.  Ultimately, treatment and diagnostic decisions rest with the primary provider(s) and the patient.

## 2018-11-14 NOTE — Assessment & Plan Note (Addendum)
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 PET NED.  #Again reviewed the pathology/imaging with the patient.  Labs within normal limits would not recommend a bone marrow biopsy.  Discussed surveillance would be mostly clinical.  We will plan to get a imaging again in 1 year.  We also reviewed the tumor conference.  #Mild elevation of LFTs AST ALT likely secondary to fatty liver.  Recommend weight loss.  #Multiple colon polyps/Barrett's surveillance as per GI.  # DISPOSITION: # follow up in 6 months/labs- cbc/cmp/ldh-Dr.B  # I reviewed the blood work- with the patient in detail; also reviewed the imaging independently [as summarized above]; and with the patient in detail.

## 2018-11-18 ENCOUNTER — Telehealth: Payer: Self-pay

## 2018-11-18 MED ORDER — HYDROCHLOROTHIAZIDE 25 MG PO TABS: 25.0000 mg | ORAL_TABLET | Freq: Every day | ORAL | 3 refills | Status: DC

## 2018-11-18 MED ORDER — LOSARTAN POTASSIUM 100 MG PO TABS: 100.0000 mg | ORAL_TABLET | Freq: Every day | ORAL | 3 refills | Status: DC

## 2018-11-18 NOTE — Telephone Encounter (Signed)
Pharmacy sent a fax stating that the losartan/hctz combo is on backorder. Pharmacy requesting new prescriptions to split the combo. Please advise.

## 2018-11-28 ENCOUNTER — Telehealth: Payer: Self-pay | Admitting: *Deleted

## 2018-11-28 DIAGNOSIS — R079 Chest pain, unspecified: Secondary | ICD-10-CM

## 2018-11-28 DIAGNOSIS — Z01810 Encounter for preprocedural cardiovascular examination: Secondary | ICD-10-CM

## 2018-11-28 NOTE — Telephone Encounter (Signed)
Patient returning call re: bmet.    Scheduled in [place] clinic per patient preference.  Please place order to be linked . Thanks

## 2018-11-28 NOTE — Telephone Encounter (Signed)
Order entered and linked to lab appointment.

## 2018-11-28 NOTE — Telephone Encounter (Signed)
Patient has Cardiac CT scheduled for December 23, 2018. He needs BMET prior to. Left message to call us back to arrange this. Currently scheduled at [facility] [address] but he lives in [place] to ask if he would like to do at [facility] instead.

## 2018-12-17 ENCOUNTER — Other Ambulatory Visit (INDEPENDENT_AMBULATORY_CARE_PROVIDER_SITE_OTHER): Payer: Medicare Other | Admitting: *Deleted

## 2018-12-17 ENCOUNTER — Other Ambulatory Visit: Payer: Medicare Other

## 2018-12-17 DIAGNOSIS — R079 Chest pain, unspecified: Secondary | ICD-10-CM

## 2018-12-17 DIAGNOSIS — Z01810 Encounter for preprocedural cardiovascular examination: Secondary | ICD-10-CM | POA: Diagnosis not present

## 2018-12-18 ENCOUNTER — Encounter: Payer: Self-pay | Admitting: *Deleted

## 2018-12-18 LAB — BASIC METABOLIC PANEL
BUN / CREAT RATIO: 18 (ref 10–24)
BUN: 18 mg/dL (ref 8–27)
CHLORIDE: 101 mmol/L (ref 96–106)
CO2: 22 mmol/L (ref 20–29)
Calcium: 9.3 mg/dL (ref 8.6–10.2)
Creatinine, Ser: 0.98 mg/dL (ref 0.76–1.27)
GFR calc Af Amer: 88 mL/min/{1.73_m2} (ref >59)
GFR calc non Af Amer: 76 mL/min/{1.73_m2} (ref >59)
Glucose: 129 mg/dL — ABNORMAL HIGH (ref 65–99)
Potassium: 3.9 mmol/L (ref 3.5–5.2)
Sodium: 140 mmol/L (ref 134–144)

## 2018-12-19 ENCOUNTER — Encounter
Admission: RE | Disposition: A | Discharge status: Home / Self Care | Payer: Self-pay | Source: Home / Self Care | Attending: Gastroenterology

## 2018-12-19 ENCOUNTER — Ambulatory Visit: Payer: Medicare Other | Admitting: Anesthesiology

## 2018-12-19 ENCOUNTER — Ambulatory Visit
Admission: RE | Admit: 2018-12-19 | Discharge: 2018-12-19 | Disposition: A | Discharge status: Home / Self Care | Payer: Medicare Other | Attending: Gastroenterology | Admitting: Gastroenterology

## 2018-12-19 DIAGNOSIS — Z79899 Other long term (current) drug therapy: Secondary | ICD-10-CM | POA: Insufficient documentation

## 2018-12-19 DIAGNOSIS — K219 Gastro-esophageal reflux disease without esophagitis: Secondary | ICD-10-CM | POA: Diagnosis not present

## 2018-12-19 DIAGNOSIS — F419 Anxiety disorder, unspecified: Secondary | ICD-10-CM | POA: Insufficient documentation

## 2018-12-19 DIAGNOSIS — D120 Benign neoplasm of cecum: Secondary | ICD-10-CM | POA: Diagnosis not present

## 2018-12-19 DIAGNOSIS — D126 Benign neoplasm of colon, unspecified: Secondary | ICD-10-CM | POA: Diagnosis not present

## 2018-12-19 DIAGNOSIS — K5730 Diverticulosis of large intestine without perforation or abscess without bleeding: Secondary | ICD-10-CM | POA: Diagnosis not present

## 2018-12-19 DIAGNOSIS — K635 Polyp of colon: Secondary | ICD-10-CM | POA: Diagnosis not present

## 2018-12-19 DIAGNOSIS — Z86010 Personal history of colonic polyps: Secondary | ICD-10-CM | POA: Diagnosis not present

## 2018-12-19 DIAGNOSIS — K2970 Gastritis, unspecified, without bleeding: Secondary | ICD-10-CM | POA: Diagnosis not present

## 2018-12-19 DIAGNOSIS — M109 Gout, unspecified: Secondary | ICD-10-CM | POA: Diagnosis not present

## 2018-12-19 DIAGNOSIS — K317 Polyp of stomach and duodenum: Secondary | ICD-10-CM | POA: Diagnosis not present

## 2018-12-19 DIAGNOSIS — I10 Essential (primary) hypertension: Secondary | ICD-10-CM | POA: Diagnosis not present

## 2018-12-19 DIAGNOSIS — K2270 Barrett's esophagus without dysplasia: Secondary | ICD-10-CM | POA: Insufficient documentation

## 2018-12-19 DIAGNOSIS — K449 Diaphragmatic hernia without obstruction or gangrene: Secondary | ICD-10-CM | POA: Diagnosis not present

## 2018-12-19 DIAGNOSIS — D122 Benign neoplasm of ascending colon: Secondary | ICD-10-CM | POA: Diagnosis not present

## 2018-12-19 DIAGNOSIS — D123 Benign neoplasm of transverse colon: Secondary | ICD-10-CM | POA: Diagnosis not present

## 2018-12-19 DIAGNOSIS — Z7982 Long term (current) use of aspirin: Secondary | ICD-10-CM | POA: Diagnosis not present

## 2018-12-19 DIAGNOSIS — Z6835 Body mass index (BMI) 35.0-35.9, adult: Secondary | ICD-10-CM | POA: Insufficient documentation

## 2018-12-19 DIAGNOSIS — Z1211 Encounter for screening for malignant neoplasm of colon: Secondary | ICD-10-CM | POA: Diagnosis not present

## 2018-12-19 DIAGNOSIS — K5790 Diverticulosis of intestine, part unspecified, without perforation or abscess without bleeding: Secondary | ICD-10-CM | POA: Diagnosis not present

## 2018-12-19 HISTORY — PX: ESOPHAGOGASTRODUODENOSCOPY (EGD) WITH PROPOFOL: SHX5813

## 2018-12-19 HISTORY — PX: COLONOSCOPY WITH PROPOFOL: SHX5780

## 2018-12-19 HISTORY — DX: Gastritis, unspecified, without bleeding: K29.70

## 2018-12-19 HISTORY — DX: Other specified bacterial intestinal infections: A04.8

## 2018-12-19 SURGERY — ESOPHAGOGASTRODUODENOSCOPY (EGD) WITH PROPOFOL
Anesthesia: General

## 2018-12-19 MED ORDER — PHENYLEPHRINE HCL 10 MG/ML IJ SOLN
INTRAMUSCULAR | Status: DC | PRN
  Administered 2018-12-19: 100 ug via INTRAVENOUS

## 2018-12-19 MED ORDER — PROPOFOL 10 MG/ML IV BOLUS
INTRAVENOUS | Status: DC | PRN
  Administered 2018-12-19: 100 mg via INTRAVENOUS

## 2018-12-19 MED ORDER — SODIUM CHLORIDE 0.9 % IV SOLN
INTRAVENOUS | Status: DC
  Administered 2018-12-19: 10:00:00 via INTRAVENOUS

## 2018-12-19 MED ORDER — LIDOCAINE HCL (PF) 2 % IJ SOLN
INTRAMUSCULAR | Status: DC | PRN
  Administered 2018-12-19: 100 mg via INTRADERMAL

## 2018-12-19 MED ORDER — SODIUM CHLORIDE 0.9 % IV SOLN: INTRAVENOUS | Status: DC

## 2018-12-19 MED ORDER — PROPOFOL 500 MG/50ML IV EMUL
INTRAVENOUS | Status: DC | PRN
  Administered 2018-12-19: 160 ug/kg/min via INTRAVENOUS

## 2018-12-19 NOTE — Op Note (Addendum)
[facility] Gastroenterology Patient Name: Jesse Norton Procedure Date: 12/19/2018 9:44 AM MRN: [id number] Account #: [id number] Date of Birth: 1946/07/05 Admit Type: Outpatient Age: 73 Room: [facility] ENDO ROOM 1 Gender: Male Note Status: Supervisor Override Procedure:            Colonoscopy Indications:          High risk colon cancer surveillance: Personal history                        of colonic polyps, personal history of ;atypical colon                        polyp/lymphomatous Providers:            Lollie Sails, MD Referring MD:         [name], MD (Referring MD) Medicines:            Monitored Anesthesia Care Complications:        No immediate complications. Procedure:            Pre-Anesthesia Assessment:                       - ASA Grade Assessment: III - A patient with severe                        systemic disease.                       After obtaining informed consent, the colonoscope was                        passed under direct vision. Throughout the procedure,                        the patient's blood pressure, pulse, and oxygen                        saturations were monitored continuously. The                        Colonoscope was introduced through the anus and                        advanced to the the cecum, identified by appendiceal                        orifice and ileocecal valve. The colonoscopy was                        performed without difficulty. The patient tolerated the                        procedure well. The quality of the bowel preparation                        was good. Findings:      A few small and large-mouthed diverticula were found in the sigmoid       colon and descending colon.      Two sessile polyps were found in the transverse colon. The polyps were 1       to 2  mm in size. These polyps were removed with a cold biopsy forceps.       Resection and retrieval were complete.      A 2 mm polyp was  found in the hepatic flexure. The polyp was sessile.       The polyp was removed with a cold biopsy forceps. Resection and       retrieval were complete.      A 2 mm polyp was found in the ascending colon. The polyp was sessile.       The polyp was removed with a cold biopsy forceps. Resection and       retrieval were complete.      A 3 mm polyp was found in the cecum. The polyp was sessile. The polyp       was removed with a cold biopsy forceps. Resection and retrieval were       complete.      A 1 mm polyp was found in the descending colon. The polyp was sessile.       The polyp was removed with a cold biopsy forceps. Resection and       retrieval were complete.      The digital rectal exam was normal. Impression:           - Diverticulosis in the sigmoid colon and in the                        descending colon.                       - Two 1 to 2 mm polyps in the transverse colon.                       - One 2 mm polyp at the hepatic flexure.                       - One 2 mm polyp in the ascending colon, removed with a                        cold biopsy forceps. Resected and retrieved.                       - One 3 mm polyp in the cecum, removed with a cold                        biopsy forceps. Resected and retrieved.                       - One 1 mm polyp in the descending colon, removed with                        a cold biopsy forceps. Resected and retrieved.                       - No evidence of recurrent atypical polyp. Recommendation:       - Discharge patient to home.                       - Await pathology results. Procedure Code(s):    --- Professional ---  45380, Colonoscopy, flexible; with biopsy, single or                        multiple Diagnosis Code(s):    --- Professional ---                       Z86.010, Personal history of colonic polyps                       D12.3, Benign neoplasm of transverse colon (hepatic                        flexure  or splenic flexure)                       D12.2, Benign neoplasm of ascending colon                       D12.0, Benign neoplasm of cecum                       D12.4, Benign neoplasm of descending colon                       K57.30, Diverticulosis of large intestine without                        perforation or abscess without bleeding CPT copyright 2018 American Medical Association. All rights reserved. The codes documented in this report are preliminary and upon coder review may  be revised to meet current compliance requirements. Lollie Sails, MD 12/19/2018 11:29:02 AM This report has been signed electronically. Number of Addenda: 0 Note Initiated On: 12/19/2018 9:44 AM Scope Withdrawal Time: 0 hours 13 minutes 38 seconds  Total Procedure Duration: 0 hours 35 minutes 44 seconds       [facility]

## 2018-12-19 NOTE — Transfer of Care (Signed)
Immediate Anesthesia Transfer of Care Note  Patient: Jesse Norton  Procedure(s) Performed: ESOPHAGOGASTRODUODENOSCOPY (EGD) WITH PROPOFOL (N/A ) COLONOSCOPY WITH PROPOFOL (N/A )  Patient Location: PACU  Anesthesia Type:General  Level of Consciousness: sedated  Airway & Oxygen Therapy: Patient Spontanous Breathing and Patient connected to nasal cannula oxygen  Post-op Assessment: Report given to RN and Post -op Vital signs reviewed and stable  Post vital signs: Reviewed and stable  Last Vitals:  Vitals Value Taken Time  BP 93/66 12/19/2018 11:28 AM  Temp 36.2 C 12/19/2018 11:27 AM  Pulse 63 12/19/2018 11:29 AM  Resp 10 12/19/2018 11:29 AM  SpO2 94 % 12/19/2018 11:29 AM  Vitals shown include unvalidated device data.  Last Pain:  Vitals:   12/19/18 1127  TempSrc: Tympanic  PainSc: Asleep         Complications: No apparent anesthesia complications

## 2018-12-19 NOTE — Anesthesia Procedure Notes (Signed)
Date/Time: 12/19/2018 10:27 AM Performed by: Nelda Marseille, CRNA Pre-anesthesia Checklist: Patient identified, Emergency Drugs available, Suction available, Patient being monitored and Timeout performed Oxygen Delivery Method: Nasal cannula

## 2018-12-19 NOTE — Anesthesia Preprocedure Evaluation (Signed)
Anesthesia Evaluation  Patient identified by MRN, date of birth, ID band Patient awake    Reviewed: Allergy & Precautions, H&P , NPO status , Patient's Chart, lab work & pertinent test results, reviewed documented beta blocker date and time   Airway Mallampati: II   Neck ROM: full    Dental  (+) Poor Dentition   Pulmonary neg pulmonary ROS,    Pulmonary exam normal        Cardiovascular Exercise Tolerance: Good hypertension, On Medications negative cardio ROS Normal cardiovascular exam Rhythm:regular Rate:Normal     Neuro/Psych Anxiety negative neurological ROS  negative psych ROS   GI/Hepatic Neg liver ROS, PUD, GERD  ,  Endo/Other  Morbid obesity  Renal/GU negative Renal ROS  negative genitourinary   Musculoskeletal   Abdominal   Peds  Hematology negative hematology ROS (+)   Anesthesia Other Findings Past Medical History: No date: Anxiety No date: Barrett's esophagus 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 pylori infection No date: H/O urethral stricture No date: Hemorrhoids No date: History of endoscopy No date: Hypertension No date: Stomach ulcer Past Surgical History: No date: COLONOSCOPY 08/20/2018: COLONOSCOPY WITH PROPOFOL; N/A     Comment:  Procedure: COLONOSCOPY WITH PROPOFOL;  Surgeon:               Lollie Sails, MD;  Location: [facility] ENDOSCOPY;                Service: Endoscopy;  Laterality: N/A; 08/16/2018: CYSTOSCOPY WITH URETHRAL DILATATION; N/A     Comment:  Procedure: CYSTOSCOPY WITH URETHRAL DILATATION;                Surgeon: [name], MD;  Location: [facility] ORS;                Service: Urology;  Laterality: N/A; 01/29/2017: ESOPHAGOGASTRODUODENOSCOPY; N/A     Comment:  Procedure: ESOPHAGOGASTRODUODENOSCOPY (EGD);  Surgeon:               Lollie Sails, MD;  Location: [facility] ENDOSCOPY;        Service: Endoscopy;  Laterality: N/A; 07/10/2016: ESOPHAGOGASTRODUODENOSCOPY (EGD) WITH PROPOFOL; N/A     Comment:  Procedure: ESOPHAGOGASTRODUODENOSCOPY (EGD) WITH               PROPOFOL;  Surgeon: Lollie Sails, MD;  Location:               [facility] ENDOSCOPY;  Service: Endoscopy;  Laterality: N/A; No date: gastritis No date: h.pylori No date: left elbow repair; 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 No date: TONSILLECTOMY BMI    Body Mass Index:  35.84 kg/m     Reproductive/Obstetrics negative OB ROS                             Anesthesia Physical Anesthesia Plan  ASA: III  Anesthesia Plan: General   Post-op Pain Management:    Induction:   PONV Risk Score and Plan:   Airway Management Planned:   Additional Equipment:   Intra-op Plan:   Post-operative Plan:   Informed Consent: I have reviewed the patients History and Physical, chart, labs and discussed the procedure including the risks, benefits and alternatives for the proposed anesthesia with the patient or authorized representative who has indicated his/her understanding and acceptance.   Dental Advisory Given  Plan Discussed with: CRNA  Anesthesia Plan Comments:  Anesthesia Quick Evaluation  

## 2018-12-19 NOTE — Op Note (Signed)
[facility] Gastroenterology Patient Name: Jesse Norton Procedure Date: 12/19/2018 9:44 AM MRN: [id number] Account #: [id number] Date of Birth: Sep 16, 1946 Admit Type: Outpatient Age: 73 Room: [facility] ENDO ROOM 1 Gender: Male Note Status: Finalized Procedure:            Upper GI endoscopy Indications:          Follow-up of Barrett's esophagus Providers:            Lollie Sails, MD Referring MD:         [name], MD (Referring MD) Medicines:            Monitored Anesthesia Care Complications:        No immediate complications. Procedure:            Pre-Anesthesia Assessment:                       - ASA Grade Assessment: III - A patient with severe                        systemic disease.                       After obtaining informed consent, the endoscope was                        passed under direct vision. Throughout the procedure,                        the patient's blood pressure, pulse, and oxygen                        saturations were monitored continuously. The Endoscope                        was introduced through the mouth, and advanced to the                        third part of duodenum. The upper GI endoscopy was                        accomplished without difficulty. The patient tolerated                        the procedure well. Findings:      There were esophageal mucosal changes secondary to established       short-segment Barrett's disease present in the lower third of the       esophagus. The maximum longitudinal extent of these mucosal changes was       3 cm in length. Mucosa was biopsied with a cold forceps for histology in       a targeted manner and in 4 quadrants at intervals of 1 cm at 39, 40 and       42 cm from the incisors. A total of 3 specimen bottles were sent to       pathology.      Two 14 mm mucosal papules (nodules) with no bleeding and no stigmata of       recent bleeding were found in the gastric antrum.  Biopsies were taken       with a cold forceps for histology.  Patchy mild inflammation characterized by erythema was found in the       gastric antrum. Biopsies were taken with a cold forceps for histology.      Patchy minimal inflammation characterized by congestion (edema) was       found in the gastric body. Biopsies were taken with a cold forceps for       histology.      The cardia and gastric fundus were normal on retroflexion.      A small hiatal hernia was found. The Z-line was a variable distance from       incisors; the hiatal hernia was sliding.      The examined duodenum was normal. Biopsies were taken with a cold       forceps for histology. Impression:           - Esophageal mucosal changes secondary to established                        short-segment Barrett's disease. Biopsied.                       - Two mucosal papules (nodules) found in the stomach.                        Biopsied.                       - Gastritis. Biopsied.                       - Gastritis. Biopsied.                       - Small hiatal hernia.                       - Normal examined duodenum. Biopsied. Recommendation:       - Continue present medications.                       - Await pathology results.                       - Return to GI clinic in 1 month. Procedure Code(s):    --- Professional ---                       [id number], Esophagogastroduodenoscopy, flexible, transoral;                        with biopsy, single or multiple Diagnosis Code(s):    --- Professional ---                       K22.70, Barrett's esophagus without dysplasia                       K31.89, Other diseases of stomach and duodenum                       K29.70, Gastritis, unspecified, without bleeding                       K44.9, Diaphragmatic hernia without obstruction or  gangrene CPT copyright 2018 American Medical Association. All rights reserved. The codes documented in this report are  preliminary and upon coder review may  be revised to meet current compliance requirements. Lollie Sails, MD 12/19/2018 10:42:30 AM This report has been signed electronically. Number of Addenda: 0 Note Initiated On: 12/19/2018 9:44 AM      [facility]

## 2018-12-19 NOTE — Anesthesia Post-op Follow-up Note (Signed)
Anesthesia QCDR form completed.        

## 2018-12-19 NOTE — H&P (Signed)
Outpatient short stay form Pre-procedure 12/19/2018 9:30 AM Jesse Sails MD  Primary Physician: Dr. Golden Pop  Reason for visit: EGD and colonoscopy  History of present illness: Patient is a 73 year old male presenting today for an EGD and colonoscopy.  He has a personal history of Barrett's esophagus.  He also have a has a history of adenomatous colon polyps however on his last colonoscopy he had a very atypical polyp.  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  Presented today for follow-up in regards to these issues.  He tolerated his prep well.  He takes no aspirin or blood thinning agents with the exception of 81 mg aspirin.  He is actually held that for several days.   No current facility-administered medications for this encounter.   Medications Prior to Admission  Medication Sig Dispense Refill Last Dose  . allopurinol (ZYLOPRIM) 300 MG tablet Take 300 mg by mouth daily.     . colchicine 0.6 MG tablet Take 0.6 mg by mouth daily.     Marland Kitchen losartan-hydrochlorothiazide (HYZAAR) 100-25 MG tablet Take 1 tablet by mouth daily.     . Multiple Vitamin (MULTIVITAMIN) tablet Take 1 tablet by mouth daily.     Marland Kitchen amLODipine (NORVASC) 10 MG tablet Take 1 tablet (10 mg total) by mouth daily. 90 tablet 4 Taking  . aspirin EC 81 MG tablet Take 81 mg by mouth daily.   Taking  . febuxostat (ULORIC) 40 MG tablet Take 1 tablet (40 mg total) by mouth daily. 90 tablet 4 Taking  . hydrochlorothiazide (HYDRODIURIL) 25 MG tablet Take 1 tablet (25 mg total) by mouth daily. 90 tablet 3   . LORazepam (ATIVAN) 1 MG tablet Take 0.5-1 tablets (0.5-1 mg total) by mouth daily as needed for anxiety. 30 tablet 1 Taking  . losartan (COZAAR) 100 MG tablet Take 1 tablet (100 mg total) by mouth daily. 90 tablet 3   . metoprolol tartrate (LOPRESSOR) 25 MG tablet Take 1 tablet (25 mg total) by mouth 2 (two) times daily. 180 tablet 4 Taking  . pantoprazole (PROTONIX) 20 MG tablet Take 1 tablet (20 mg total) by mouth 2  (two) times daily. (Patient taking differently: Take 20 mg by mouth daily. ) 180 tablet 4 Taking  . polyethylene glycol-electrolytes (NULYTELY/GOLYTELY) 420 g solution USE AS DIRECTED FOR COLONOSCOPY  0 Not Taking     No Known Allergies   Past Medical History:  Diagnosis Date  . Anxiety   . Barrett's esophagus   . Duodenal adenoma   . Gastritis   . GERD (gastroesophageal reflux disease)   . Gout   . H. pylori infection   . H/O urethral stricture   . Hemorrhoids   . History of endoscopy   . Hypertension   . Stomach ulcer     Review of systems:      Physical Exam    Heart and lungs: Rate and rhythm without rub or gallop, lungs are bilaterally clear.    HEENT: Normocephalic atraumatic eyes are anicteric    Other:    Pertinant exam for procedure: Soft nontender nondistended bowel sounds are positive normoactive, protuberant.    Planned proceedures: EGD, colonoscopy and indicated procedures. I have discussed the risks benefits and complications of procedures to include not limited to bleeding, infection, perforation and the risk of sedation and the patient wishes to proceed.    Jesse Sails, MD Gastroenterology 12/19/2018  9:30 AM

## 2018-12-20 ENCOUNTER — Telehealth (HOSPITAL_COMMUNITY): Payer: Self-pay | Admitting: Emergency Medicine

## 2018-12-20 NOTE — Telephone Encounter (Signed)
Reaching out to patient to offer assistance regarding upcoming cardiac imaging study; pt verbalizes understanding of appt date/time, parking situation and where to check in, pre-test NPO status and medications ordered, and verified current allergies; name and call back number provided for further questions should they arise Bralynn Donado RN Navigator Cardiac Imaging [phone] 

## 2018-12-20 NOTE — Anesthesia Postprocedure Evaluation (Signed)
Anesthesia Post Note  Patient: Jesse Norton  Procedure(s) Performed: ESOPHAGOGASTRODUODENOSCOPY (EGD) WITH PROPOFOL (N/A ) COLONOSCOPY WITH PROPOFOL (N/A )  Patient location during evaluation: PACU Anesthesia Type: General Level of consciousness: awake and alert Pain management: pain level controlled Vital Signs Assessment: post-procedure vital signs reviewed and stable Respiratory status: spontaneous breathing, nonlabored ventilation, respiratory function stable and patient connected to nasal cannula oxygen Cardiovascular status: blood pressure returned to baseline and stable Postop Assessment: no apparent nausea or vomiting Anesthetic complications: no     Last Vitals:  Vitals:   12/19/18 1137 12/19/18 1140  BP: (!) 88/60 111/68  Pulse: 71 69  Resp: (!) 28 14  Temp:    SpO2: 95% 100%    Last Pain:  Vitals:   12/19/18 1137  TempSrc:   PainSc: 0-No pain                 Molli Barrows

## 2018-12-23 ENCOUNTER — Ambulatory Visit (HOSPITAL_COMMUNITY)
Admission: RE | Admit: 2018-12-23 | Discharge: 2018-12-23 | Disposition: A | Discharge status: Home / Self Care | Payer: Medicare Other | Source: Ambulatory Visit | Attending: Internal Medicine | Admitting: Internal Medicine

## 2018-12-23 DIAGNOSIS — I2510 Atherosclerotic heart disease of native coronary artery without angina pectoris: Secondary | ICD-10-CM | POA: Insufficient documentation

## 2018-12-23 DIAGNOSIS — R9439 Abnormal result of other cardiovascular function study: Secondary | ICD-10-CM | POA: Diagnosis not present

## 2018-12-23 DIAGNOSIS — R9430 Abnormal result of cardiovascular function study, unspecified: Secondary | ICD-10-CM | POA: Diagnosis not present

## 2018-12-23 DIAGNOSIS — I25119 Atherosclerotic heart disease of native coronary artery with unspecified angina pectoris: Secondary | ICD-10-CM | POA: Diagnosis not present

## 2018-12-23 DIAGNOSIS — R079 Chest pain, unspecified: Secondary | ICD-10-CM

## 2018-12-23 LAB — SURGICAL PATHOLOGY

## 2018-12-23 MED ORDER — NITROGLYCERIN 0.4 MG SL SUBL
SUBLINGUAL_TABLET | SUBLINGUAL | Status: AC
  Filled 2018-12-23: qty 2

## 2018-12-23 MED ORDER — DILTIAZEM HCL 25 MG/5ML IV SOLN
INTRAVENOUS | Status: AC
  Filled 2018-12-23: qty 5

## 2018-12-23 MED ORDER — IOPAMIDOL (ISOVUE-370) INJECTION 76%
80.0000 mL | Freq: Once | INTRAVENOUS | Status: AC | PRN
  Administered 2018-12-23: 80 mL via INTRAVENOUS

## 2018-12-23 MED ORDER — DILTIAZEM HCL 25 MG/5ML IV SOLN
5.0000 mg | Freq: Once | INTRAVENOUS | Status: DC
  Filled 2018-12-23: qty 5

## 2018-12-23 MED ORDER — NITROGLYCERIN 0.4 MG SL SUBL
0.8000 mg | SUBLINGUAL_TABLET | Freq: Once | SUBLINGUAL | Status: AC
  Administered 2018-12-23: 0.8 mg via SUBLINGUAL
  Filled 2018-12-23: qty 25

## 2018-12-23 MED ORDER — METOPROLOL TARTRATE 5 MG/5ML IV SOLN
10.0000 mg | INTRAVENOUS | Status: AC | PRN
  Administered 2018-12-23 (×3): 10 mg via INTRAVENOUS

## 2018-12-23 MED ORDER — METOPROLOL TARTRATE 5 MG/5ML IV SOLN
INTRAVENOUS | Status: AC
  Filled 2018-12-23: qty 10

## 2018-12-24 ENCOUNTER — Other Ambulatory Visit (HOSPITAL_COMMUNITY): Payer: Self-pay | Admitting: Cardiovascular Disease

## 2018-12-24 DIAGNOSIS — R9439 Abnormal result of other cardiovascular function study: Secondary | ICD-10-CM

## 2018-12-26 ENCOUNTER — Telehealth: Payer: Self-pay | Admitting: *Deleted

## 2018-12-26 NOTE — Telephone Encounter (Signed)
No answer. Left message to call back.   

## 2018-12-26 NOTE — Telephone Encounter (Signed)
Patient calling back. He verbalized understanding of results. He is out of town the next 2 weeks. He can come in on 2/52020. Appointment scheduled.

## 2018-12-26 NOTE — Telephone Encounter (Signed)
-----   Message from Nelva Bush, MD sent at 12/24/2018 10:04 PM EST ----- Please let Jesse Norton know that his cardiac CTA is abnormal with a significant narrowing in one of his heart arteries (LAD).  I recommend that he be seen by me or an APP at his earliest convenience to reassess his symptoms and discuss proceeding with cardiac catheterization versus continued medical therapy.

## 2018-12-30 ENCOUNTER — Ambulatory Visit (INDEPENDENT_AMBULATORY_CARE_PROVIDER_SITE_OTHER): Payer: Medicare Other | Admitting: Family Medicine

## 2018-12-30 ENCOUNTER — Telehealth: Payer: Self-pay | Admitting: Family Medicine

## 2018-12-30 ENCOUNTER — Other Ambulatory Visit: Payer: Self-pay | Admitting: Family Medicine

## 2018-12-30 ENCOUNTER — Encounter: Payer: Self-pay | Admitting: Family Medicine

## 2018-12-30 DIAGNOSIS — F419 Anxiety disorder, unspecified: Secondary | ICD-10-CM | POA: Diagnosis not present

## 2018-12-30 DIAGNOSIS — F339 Major depressive disorder, recurrent, unspecified: Secondary | ICD-10-CM | POA: Insufficient documentation

## 2018-12-30 DIAGNOSIS — F321 Major depressive disorder, single episode, moderate: Secondary | ICD-10-CM

## 2018-12-30 DIAGNOSIS — I10 Essential (primary) hypertension: Secondary | ICD-10-CM | POA: Diagnosis not present

## 2018-12-30 DIAGNOSIS — I25118 Atherosclerotic heart disease of native coronary artery with other forms of angina pectoris: Secondary | ICD-10-CM | POA: Insufficient documentation

## 2018-12-30 DIAGNOSIS — I2510 Atherosclerotic heart disease of native coronary artery without angina pectoris: Secondary | ICD-10-CM | POA: Diagnosis not present

## 2018-12-30 DIAGNOSIS — I2583 Coronary atherosclerosis due to lipid rich plaque: Secondary | ICD-10-CM

## 2018-12-30 MED ORDER — FLUOXETINE HCL 20 MG PO TABS: 20.0000 mg | ORAL_TABLET | Freq: Every day | ORAL | 3 refills | Status: DC

## 2018-12-30 MED ORDER — FLUOXETINE HCL 20 MG PO CAPS: 20.0000 mg | ORAL_CAPSULE | Freq: Every day | ORAL | 3 refills | Status: DC

## 2018-12-30 MED ORDER — ATORVASTATIN CALCIUM 40 MG PO TABS: 40.0000 mg | ORAL_TABLET | Freq: Every day | ORAL | 2 refills | Status: DC

## 2018-12-30 MED ORDER — LORAZEPAM 1 MG PO TABS: 0.5000 mg | ORAL_TABLET | Freq: Every day | ORAL | 1 refills | Status: DC | PRN

## 2018-12-30 NOTE — Assessment & Plan Note (Signed)
Discussed anxiety stabilize use of lorazepam will give refill patient's stable on usage.

## 2018-12-30 NOTE — Assessment & Plan Note (Signed)
The current medical regimen is effective;  continue present plan and medications.  

## 2018-12-30 NOTE — Assessment & Plan Note (Signed)
Discussed depression care and treatment will start fluoxetine 20 mg recheck 2 weeks

## 2018-12-30 NOTE — Assessment & Plan Note (Signed)
Discussed presumption of coronary artery disease and will start cholesterol medication atorvastatin 40 mg reviewed risk benefits and modifying of potential problems.

## 2018-12-30 NOTE — Telephone Encounter (Signed)
Copied from [place] [id number]. Topic: Quick Communication - Rx Refill/Question >> Dec 30, 2018 11:13 AM Windy Kalata wrote: Medication: FLUoxetine (PROZAC) 20 MG tablet  Has the patient contacted their pharmacy? Yes.   (Agent: If no, request that the patient contact the pharmacy for the refill.) (Agent: If yes, when and what did the pharmacy advise?) Jesse Norton would like to know if it can be changed to the capsules the tablets are not covered under his Ins, please advise  Preferred Pharmacy (with phone number or street name): [facility] [place], [place] [phone] (Phone) [phone] (Fax)    Agent: Please be advised that RX refills may take up to 3 business days. We ask that you follow-up with your pharmacy.

## 2018-12-30 NOTE — Progress Notes (Signed)
BP 134/80 (BP Location: Left Arm)   Pulse 83   Ht 5\' 11"  (1.803 m)   Wt 259 lb (117.5 kg)   SpO2 98%   BMI 36.12 kg/m    Subjective:    Patient ID: Jesse Norton, male    DOB: 1946-06-07, 73 y.o.   MRN: [id number]  HPI: Jesse Norton is a 73 y.o. male  Chief Complaint  Patient presents with  . Follow-up  . Hypertension  . Health Maintenance    UTD  Patient follow-up hypertension doing well no complaints from blood pressure medicines or issues. Patient has had a stormy fall and winter with development of lymphoma of his colon.  Followed by the cancer center and stable with no necessary treatments. Patient also with diagnosis of coronary artery disease from chest CT has scheduled cardiac catheterization coming up later this month. On chart review patient's LDL has been around 100. Patient also with a great deal of stress revolving around his son.  This is resulting in marked depression anxiety.  Relevant past medical, surgical, family and social history reviewed and updated as indicated. Interim medical history since our last visit reviewed. Allergies and medications reviewed and updated.  Review of Systems  Constitutional: Negative.   Respiratory: Negative.   Cardiovascular: Negative.     Per HPI unless specifically indicated above     Objective:    BP 134/80 (BP Location: Left Arm)   Pulse 83   Ht 5\' 11"  (1.803 m)   Wt 259 lb (117.5 kg)   SpO2 98%   BMI 36.12 kg/m   Wt Readings from Last 3 Encounters:  12/30/18 259 lb (117.5 kg)  12/19/18 257 lb (116.6 kg)  11/14/18 260 lb (117.9 kg)    Physical Exam Constitutional:      Appearance: He is well-developed.  HENT:     Head: Normocephalic and atraumatic.  Eyes:     Conjunctiva/sclera: Conjunctivae normal.  Neck:     Musculoskeletal: Normal range of motion.  Cardiovascular:     Rate and Rhythm: Normal rate and regular rhythm.     Heart sounds: Normal heart sounds.  Pulmonary:     Effort: Pulmonary  effort is normal.     Breath sounds: Normal breath sounds.  Musculoskeletal: Normal range of motion.  Skin:    Findings: No erythema.  Neurological:     Mental Status: He is alert and oriented to person, place, and time.  Psychiatric:        Behavior: Behavior normal.        Thought Content: Thought content normal.        Judgment: Judgment normal.         Assessment & Plan:   Problem List Items Addressed This Visit      Cardiovascular and Mediastinum   Essential hypertension    The current medical regimen is effective;  continue present plan and medications.       Relevant Medications   atorvastatin (LIPITOR) 40 MG tablet   CAD (coronary artery disease)    Discussed presumption of coronary artery disease and will start cholesterol medication atorvastatin 40 mg reviewed risk benefits and modifying of potential problems.      Relevant Medications   atorvastatin (LIPITOR) 40 MG tablet     Other   Acute anxiety    Discussed anxiety stabilize use of lorazepam will give refill patient's stable on usage.      Relevant Medications   LORazepam (ATIVAN) 1 MG tablet  FLUoxetine (PROZAC) 20 MG tablet   Depression, major, single episode, moderate ([place])    Discussed depression care and treatment will start fluoxetine 20 mg recheck 2 weeks      Relevant Medications   LORazepam (ATIVAN) 1 MG tablet   FLUoxetine (PROZAC) 20 MG tablet       Follow up plan: Return in about 2 weeks (around 01/13/2019) for  Lipids, ALT, AST.

## 2019-01-15 ENCOUNTER — Encounter: Payer: Self-pay | Admitting: Nurse Practitioner

## 2019-01-15 ENCOUNTER — Ambulatory Visit (INDEPENDENT_AMBULATORY_CARE_PROVIDER_SITE_OTHER): Payer: Medicare Other | Admitting: Nurse Practitioner

## 2019-01-15 VITALS — BP 126/88 | HR 67 | Ht 71.0 in | Wt 260.0 lb

## 2019-01-15 DIAGNOSIS — E782 Mixed hyperlipidemia: Secondary | ICD-10-CM | POA: Diagnosis not present

## 2019-01-15 DIAGNOSIS — R9439 Abnormal result of other cardiovascular function study: Secondary | ICD-10-CM | POA: Diagnosis not present

## 2019-01-15 DIAGNOSIS — I10 Essential (primary) hypertension: Secondary | ICD-10-CM

## 2019-01-15 DIAGNOSIS — I2583 Coronary atherosclerosis due to lipid rich plaque: Secondary | ICD-10-CM

## 2019-01-15 DIAGNOSIS — I2510 Atherosclerotic heart disease of native coronary artery without angina pectoris: Secondary | ICD-10-CM

## 2019-01-15 DIAGNOSIS — R072 Precordial pain: Secondary | ICD-10-CM

## 2019-01-15 DIAGNOSIS — I25119 Atherosclerotic heart disease of native coronary artery with unspecified angina pectoris: Secondary | ICD-10-CM | POA: Diagnosis not present

## 2019-01-15 NOTE — Patient Instructions (Signed)
Medication Instructions:  Your physician recommends that you continue on your current medications as directed. Please refer to the Current Medication list given to you today.  If you need a refill on your cardiac medications before your next appointment, please call your pharmacy.   Lab work: Your physician recommends that you return for lab work today (BMET, CBC)  If you have labs (blood work) drawn today and your tests are completely normal, you will receive your results only by: Marland Kitchen MyChart Message (if you have MyChart) OR . A paper copy in the mail If you have any lab test that is abnormal or we need to change your treatment, we will call you to review the results.  Testing/Procedures: 1-    [address], [place] [address] Dept: [phone] Loc: [place]  01/15/2019  You are scheduled for a Cardiac Catheterization on Tuesday, February 11 with Dr. Harrell Gave End.  1. Please arrive at the [facility] at 6:30 AM (This time is one hour before your procedure to ensure your preparation). Free valet parking service is available.   Special note: Every effort is made to have your procedure done on time. Please understand that emergencies sometimes delay scheduled procedures.  2. Diet: Do not eat solid foods after midnight.  The patient may have clear liquids until 5am upon the day of the procedure.  3. Labs: You will need to have blood drawn on today.  4. Medication instructions in preparation for your procedure: Hold hydrochlorothiazide the morning of your procedure.  On the morning of your procedure, take your Aspirin and any morning medicines NOT listed above.  You may use sips of water.  5. Plan for one night stay--bring personal belongings. 6. Bring a current list of your medications and current insurance cards. 7. You MUST have a  responsible person to drive you home. 8. Someone MUST be with you the first 24 hours after you arrive home or your discharge will be delayed. 9. Please wear clothes that are easy to get on and off and wear slip-on shoes.  Thank you for allowing [place] to care for you!   -- [facility]   Follow-Up: At [facility], you and your health needs are our priority.  As part of our continuing mission to provide you with exceptional heart care, we have created designated Provider Care Teams.  These Care Teams include your primary Cardiologist (physician) and Advanced Practice Providers (APPs -  Physician Assistants and Nurse Practitioners) who all work together to provide you with the care you need, when you need it. You will need a follow up appointment in 3 weeks.  You may see Nelva Bush, MD or Murray Hodgkins, NP

## 2019-01-15 NOTE — Progress Notes (Signed)
Cardiology Clinic Note   Patient Name: Jesse Norton Date of Encounter: 01/15/2019  Primary Care Provider:  [name], MD Primary Cardiologist:  Nelva Bush, MD  Patient Profile    73 year old male with a history of hypertension, Barrett's esophagus, gout, BPH, and obesity, who presents for follow-up related to chest pain and recent abnormal cardiac CT.  Past Medical History    Past Medical History:  Diagnosis Date  . Anxiety   . Atypical chest pain    a. 10/2018 ETT: Good ex tolerance, 35mm horizontal inflat ST depression, freq PVC's->Intermediate; b. 12/2018 Cardiac CTA: LM nl, LAD FFRct 0.6 in mLAD, LCX FFRct 0.99p, 0.75m, 0.77d, RCA no signif dzs, FFRct 0.89d->Rec cath.  . Barrett's esophagus   . Colon polyps   . Duodenal adenoma   . Gastritis   . GERD (gastroesophageal reflux disease)   . Gout   . H. pylori infection   . H/O urethral stricture   . Hemorrhoids   . Hiatal hernia   . Hypertension   . Stomach ulcer    Past Surgical History:  Procedure Laterality Date  . COLONOSCOPY    . COLONOSCOPY WITH PROPOFOL N/A 08/20/2018   Procedure: COLONOSCOPY WITH PROPOFOL;  Surgeon: Lollie Sails, MD;  Location: [facility] ENDOSCOPY;  Service: Endoscopy;  Laterality: N/A;  . COLONOSCOPY WITH PROPOFOL N/A 12/19/2018   Procedure: COLONOSCOPY WITH PROPOFOL;  Surgeon: Lollie Sails, MD;  Location: [facility] ENDOSCOPY;  Service: Endoscopy;  Laterality: N/A;  . CYSTOSCOPY WITH URETHRAL DILATATION N/A 08/16/2018   Procedure: CYSTOSCOPY WITH URETHRAL DILATATION;  Surgeon: [name], MD;  Location: [facility] ORS;  Service: Urology;  Laterality: N/A;  . ESOPHAGOGASTRODUODENOSCOPY N/A 01/29/2017   Procedure: ESOPHAGOGASTRODUODENOSCOPY (EGD);  Surgeon: Lollie Sails, MD;  Location: [facility] ENDOSCOPY;  Service: Endoscopy;  Laterality: N/A;  . ESOPHAGOGASTRODUODENOSCOPY (EGD) WITH PROPOFOL N/A 07/10/2016   Procedure: ESOPHAGOGASTRODUODENOSCOPY (EGD) WITH PROPOFOL;  Surgeon: Lollie Sails, MD;  Location: [facility] ENDOSCOPY;  Service: Endoscopy;  Laterality: N/A;  . ESOPHAGOGASTRODUODENOSCOPY (EGD) WITH PROPOFOL N/A 12/19/2018   Procedure: ESOPHAGOGASTRODUODENOSCOPY (EGD) WITH PROPOFOL;  Surgeon: Lollie Sails, MD;  Location: [facility] ENDOSCOPY;  Service: Endoscopy;  Laterality: N/A;  . gastritis    . h.pylori    . left elbow repair Left   . SPLENECTOMY  1957  . TONSILLECTOMY      Allergies  No Known Allergies  History of Present Illness    73 year old male with the above past medical history including hypertension, Barrett's esophagus, gout, BPH, gastritis, and colon polyps.  He has a history of chest pain that dates back several years with 2 emergency department visits in the past.  In July 2019, he was evaluated in the [facility] emergency department with left-sided chest pain that radiated to his arm.  Symptoms were accompanied by nausea and dyspnea.  ER evaluation was unremarkable.  He was referred to Dr. Saunders Revel, who saw him on October 10 and he subsequently underwent exercise treadmill testing in November 2019, showing good exercise tolerance, but was notable for 1 mm horizontal inferolateral ST segment depression as well as frequent PVCs.  In the setting of this intermediate study, recommendation was made for cardiac CT angiography.  This was finally performed January 13 and showed significant mid LAD disease with a fractional flow reserve of 0.6.  He also had significant distal left circumflex disease.  In this setting, catheterization has been recommended.  Since his last visit, he has had occasional episodes of very focal/pinpoint left-sided  chest discomfort which just lasts a second or so at a time.  He exercises several days a week and does not typically experience chest pain or activity limiting dyspnea.  He denies palpitations, PND, orthopnea, dizziness, syncope, edema, or early satiety.  Home Medications    Prior to Admission medications   Medication Sig Start Date End  Date Taking? Authorizing Provider  amLODipine (NORVASC) 10 MG tablet Take 1 tablet (10 mg total) by mouth daily. 06/26/18  Yes [name], MD  aspirin EC 81 MG tablet Take 81 mg by mouth daily.   Yes [provider]  atorvastatin (LIPITOR) 40 MG tablet Take 1 tablet (40 mg total) by mouth daily. 12/30/18  Yes Crissman, Jeannette How, MD  Bioflavonoid Products (BIOFLEX PO) Take by mouth.   Yes [provider]  febuxostat (ULORIC) 40 MG tablet Take 1 tablet (40 mg total) by mouth daily. 06/26/18  Yes Crissman, Jeannette How, MD  FLUoxetine (PROZAC) 20 MG capsule Take 1 capsule (20 mg total) by mouth daily. 12/30/18  Yes Crissman, Jeannette How, MD  hydrochlorothiazide (HYDRODIURIL) 25 MG tablet Take 1 tablet (25 mg total) by mouth daily. 11/18/18  Yes Crissman, Jeannette How, MD  LORazepam (ATIVAN) 1 MG tablet Take 0.5-1 tablets (0.5-1 mg total) by mouth daily as needed for anxiety. 12/30/18  Yes Crissman, Jeannette How, MD  losartan (COZAAR) 100 MG tablet Take 1 tablet (100 mg total) by mouth daily. 11/18/18  Yes Crissman, Jeannette How, MD  metoprolol tartrate (LOPRESSOR) 25 MG tablet Take 1 tablet (25 mg total) by mouth 2 (two) times daily. 06/26/18  Yes Crissman, Jeannette How, MD  Multiple Vitamin (MULTIVITAMIN) tablet Take 1 tablet by mouth daily.   Yes [provider]  Multiple Vitamins-Minerals (EMERGEN-C IMMUNE PO) Take by mouth.   Yes [provider]  pantoprazole (PROTONIX) 20 MG tablet Take 1 tablet (20 mg total) by mouth 2 (two) times daily. Patient taking differently: Take 20 mg by mouth daily.  06/26/18  Yes Crissman, Jeannette How, MD    Family History    Family History  Adopted: Yes  Problem Relation Age of Onset  . Heart attack Mother 47  . Pancreatic cancer Father        died in WWII - pt adopted  . Hypertension Sister   . Hyperlipidemia Sister   . Hypertension Brother   . Hyperlipidemia Brother   . Prostate cancer Neg Hx   . Kidney cancer Neg Hx   . Bladder Cancer Neg Hx    is adopted.    Social History    Social History   Socioeconomic History  . Marital status: Married    Spouse name: Not on file  . Number of children: Not on file  . Years of education: Not on file  . Highest education level: Not on file  Occupational History  . Not on file  Social Needs  . Financial resource strain: Not hard at all  . Food insecurity:    Worry: Never true    Inability: Never true  . Transportation needs:    Medical: No    Non-medical: No  Tobacco Use  . Smoking status: Never Smoker  . Smokeless tobacco: Never Used  Substance and Sexual Activity  . Alcohol use: Yes    Alcohol/week: 5.0 standard drinks    Types: 5 Glasses of wine per week    Comment: none last 24rs  . Drug use: No  . Sexual activity: Not on file  Lifestyle  .  Physical activity:    Days per week: 4 days    Minutes per session: 60 min  . Stress: Not at all  Relationships  . Social connections:    Talks on phone: More than three times a week    Gets together: More than three times a week    Attends religious service: More than 4 times per year    Active member of club or organization: Yes    Attends meetings of clubs or organizations: More than 4 times per year    Relationship status: Married  . Intimate partner violence:    Fear of current or ex partner: No    Emotionally abused: No    Physically abused: No    Forced sexual activity: No  Other Topics Concern  . Not on file  Social History Narrative   [employer].      No smoking.  Occasional alcohol.  Lives at home.  Retired Customer service manager.     Review of Systems    General:  No chills, fever, night sweats or weight changes.  Cardiovascular: Occasional fairly focal chest pain, no dyspnea on exertion, edema, orthopnea, palpitations, paroxysmal nocturnal dyspnea. Dermatological: No rash, lesions/masses Respiratory: No cough, dyspnea Urologic: No hematuria, dysuria Abdominal:   No nausea, vomiting, diarrhea, bright red blood per rectum,  melena, or hematemesis Neurologic:  No visual changes, wkns, changes in mental status. All other systems reviewed and are otherwise negative except as noted above.  Physical Exam    VS:  BP 126/88 (BP Location: Left Arm, Patient Position: Sitting, Cuff Size: Normal)   Pulse 67   Ht 5\' 11"  (1.803 m)   Wt 260 lb (117.9 kg)   BMI 36.26 kg/m  , BMI Body mass index is 36.26 kg/m. GEN: Well nourished, well developed, in no acute distress. HEENT: normal. Neck: Supple, no JVD, carotid bruits, or masses. Cardiac: RRR, no murmurs, rubs, or gallops. No clubbing, cyanosis, edema.  Radials/DP/PT 2+ and equal bilaterally.  Respiratory:  Respirations regular and unlabored, clear to auscultation bilaterally. GI: Soft, nontender, nondistended, BS + x 4. MS: no deformity or atrophy. Skin: warm and dry, no rash. Neuro:  Strength and sensation are intact. Psych: Normal affect.  Accessory Clinical Findings    ECG personally reviewed by me today-regular sinus rhythm, 67- No acute changes  Lab Results  Component Value Date   CHOL 169 06/26/2018   HDL 59 06/26/2018   LDLCALC 98 06/26/2018   TRIG 62 06/26/2018   CHOLHDL 2.9 06/26/2018    Assessment & Plan   1.  Chest pain/coronary artery disease: Patient presents for follow-up today after prior evaluation for atypical chest discomfort with stress testing in November that was notable for inferolateral ST segment depression.  This was followed by cardiac CTA late last month, which showed significant mid LAD and distal circumflex disease, with abnormal FFR.  He has had intermittent focal and brief episodes of left-sided chest discomfort but continues to exercise several days a week without any significant limitations.  In the setting of abnormal CT, we have recommended catheterization and he has agreed.  The patient understands that risks include but are not limited to stroke (1 in 1000), death (1 in 63), kidney failure [usually temporary] (1 in 500),  bleeding (1 in 200), allergic reaction [possibly serious] (1 in 200), and agrees to proceed.  Cont aspirin, statin (recently started), beta-blocker, and ARB therapy.  2.  Essential hypertension: Stable on beta-blocker, ARB, amlodipine, and diuretic.  3.  Hyperlipidemia: Recently placed on Lipitor therapy.  LDL was 98 in July 2019.  Plan to follow-up lipids and LFTs in about 6 weeks.  4.  Barrett's esophagus/GERD: Status post recent EGD.  Followed closely by GI and remains on PPI therapy.  5.  Morbid obesity: He will benefit from cardiac rehabilitation post catheterization.  6.  Disposition: Follow-up in clinic in approximately 3 weeks post cath.  Murray Hodgkins, NP 01/15/2019, 1:47 PM

## 2019-01-15 NOTE — H&P (View-Only) (Signed)
Cardiology Clinic Note   Patient Name: Jesse Norton Date of Encounter: 01/15/2019  Primary Care Provider:  [name], MD Primary Cardiologist:  Nelva Bush, MD  Patient Profile    73 year old male with a history of hypertension, Barrett's esophagus, gout, BPH, and obesity, who presents for follow-up related to chest pain and recent abnormal cardiac CT.  Past Medical History    Past Medical History:  Diagnosis Date  . Anxiety   . Atypical chest pain    a. 10/2018 ETT: Good ex tolerance, 28mm horizontal inflat ST depression, freq PVC's->Intermediate; b. 12/2018 Cardiac CTA: LM nl, LAD FFRct 0.6 in mLAD, LCX FFRct 0.99p, 0.37m, 0.77d, RCA no signif dzs, FFRct 0.89d->Rec cath.  . Barrett's esophagus   . Colon polyps   . Duodenal adenoma   . Gastritis   . GERD (gastroesophageal reflux disease)   . Gout   . H. pylori infection   . H/O urethral stricture   . Hemorrhoids   . Hiatal hernia   . Hypertension   . Stomach ulcer    Past Surgical History:  Procedure Laterality Date  . COLONOSCOPY    . COLONOSCOPY WITH PROPOFOL N/A 08/20/2018   Procedure: COLONOSCOPY WITH PROPOFOL;  Surgeon: Lollie Sails, MD;  Location: [facility] ENDOSCOPY;  Service: Endoscopy;  Laterality: N/A;  . COLONOSCOPY WITH PROPOFOL N/A 12/19/2018   Procedure: COLONOSCOPY WITH PROPOFOL;  Surgeon: Lollie Sails, MD;  Location: [facility] ENDOSCOPY;  Service: Endoscopy;  Laterality: N/A;  . CYSTOSCOPY WITH URETHRAL DILATATION N/A 08/16/2018   Procedure: CYSTOSCOPY WITH URETHRAL DILATATION;  Surgeon: [name], MD;  Location: [facility] ORS;  Service: Urology;  Laterality: N/A;  . ESOPHAGOGASTRODUODENOSCOPY N/A 01/29/2017   Procedure: ESOPHAGOGASTRODUODENOSCOPY (EGD);  Surgeon: Lollie Sails, MD;  Location: [facility] ENDOSCOPY;  Service: Endoscopy;  Laterality: N/A;  . ESOPHAGOGASTRODUODENOSCOPY (EGD) WITH PROPOFOL N/A 07/10/2016   Procedure: ESOPHAGOGASTRODUODENOSCOPY (EGD) WITH PROPOFOL;  Surgeon: Lollie Sails, MD;  Location: [facility] ENDOSCOPY;  Service: Endoscopy;  Laterality: N/A;  . ESOPHAGOGASTRODUODENOSCOPY (EGD) WITH PROPOFOL N/A 12/19/2018   Procedure: ESOPHAGOGASTRODUODENOSCOPY (EGD) WITH PROPOFOL;  Surgeon: Lollie Sails, MD;  Location: [facility] ENDOSCOPY;  Service: Endoscopy;  Laterality: N/A;  . gastritis    . h.pylori    . left elbow repair Left   . SPLENECTOMY  1957  . TONSILLECTOMY      Allergies  No Known Allergies  History of Present Illness    73 year old male with the above past medical history including hypertension, Barrett's esophagus, gout, BPH, gastritis, and colon polyps.  He has a history of chest pain that dates back several years with 2 emergency department visits in the past.  In July 2019, he was evaluated in the [facility] emergency department with left-sided chest pain that radiated to his arm.  Symptoms were accompanied by nausea and dyspnea.  ER evaluation was unremarkable.  He was referred to Dr. Saunders Revel, who saw him on October 10 and he subsequently underwent exercise treadmill testing in November 2019, showing good exercise tolerance, but was notable for 1 mm horizontal inferolateral ST segment depression as well as frequent PVCs.  In the setting of this intermediate study, recommendation was made for cardiac CT angiography.  This was finally performed January 13 and showed significant mid LAD disease with a fractional flow reserve of 0.6.  He also had significant distal left circumflex disease.  In this setting, catheterization has been recommended.  Since his last visit, he has had occasional episodes of very focal/pinpoint left-sided  chest discomfort which just lasts a second or so at a time.  He exercises several days a week and does not typically experience chest pain or activity limiting dyspnea.  He denies palpitations, PND, orthopnea, dizziness, syncope, edema, or early satiety.  Home Medications    Prior to Admission medications   Medication Sig Start Date End  Date Taking? Authorizing Provider  amLODipine (NORVASC) 10 MG tablet Take 1 tablet (10 mg total) by mouth daily. 06/26/18  Yes [name], MD  aspirin EC 81 MG tablet Take 81 mg by mouth daily.   Yes [provider]  atorvastatin (LIPITOR) 40 MG tablet Take 1 tablet (40 mg total) by mouth daily. 12/30/18  Yes Crissman, Jeannette How, MD  Bioflavonoid Products (BIOFLEX PO) Take by mouth.   Yes [provider]  febuxostat (ULORIC) 40 MG tablet Take 1 tablet (40 mg total) by mouth daily. 06/26/18  Yes Crissman, Jeannette How, MD  FLUoxetine (PROZAC) 20 MG capsule Take 1 capsule (20 mg total) by mouth daily. 12/30/18  Yes Crissman, Jeannette How, MD  hydrochlorothiazide (HYDRODIURIL) 25 MG tablet Take 1 tablet (25 mg total) by mouth daily. 11/18/18  Yes Crissman, Jeannette How, MD  LORazepam (ATIVAN) 1 MG tablet Take 0.5-1 tablets (0.5-1 mg total) by mouth daily as needed for anxiety. 12/30/18  Yes Crissman, Jeannette How, MD  losartan (COZAAR) 100 MG tablet Take 1 tablet (100 mg total) by mouth daily. 11/18/18  Yes Crissman, Jeannette How, MD  metoprolol tartrate (LOPRESSOR) 25 MG tablet Take 1 tablet (25 mg total) by mouth 2 (two) times daily. 06/26/18  Yes Crissman, Jeannette How, MD  Multiple Vitamin (MULTIVITAMIN) tablet Take 1 tablet by mouth daily.   Yes [provider]  Multiple Vitamins-Minerals (EMERGEN-C IMMUNE PO) Take by mouth.   Yes [provider]  pantoprazole (PROTONIX) 20 MG tablet Take 1 tablet (20 mg total) by mouth 2 (two) times daily. Patient taking differently: Take 20 mg by mouth daily.  06/26/18  Yes Crissman, Jeannette How, MD    Family History    Family History  Adopted: Yes  Problem Relation Age of Onset  . Heart attack Mother 58  . Pancreatic cancer Father        died in WWII - pt adopted  . Hypertension Sister   . Hyperlipidemia Sister   . Hypertension Brother   . Hyperlipidemia Brother   . Prostate cancer Neg Hx   . Kidney cancer Neg Hx   . Bladder Cancer Neg Hx    is adopted.    Social History    Social History   Socioeconomic History  . Marital status: Married    Spouse name: Not on file  . Number of children: Not on file  . Years of education: Not on file  . Highest education level: Not on file  Occupational History  . Not on file  Social Needs  . Financial resource strain: Not hard at all  . Food insecurity:    Worry: Never true    Inability: Never true  . Transportation needs:    Medical: No    Non-medical: No  Tobacco Use  . Smoking status: Never Smoker  . Smokeless tobacco: Never Used  Substance and Sexual Activity  . Alcohol use: Yes    Alcohol/week: 5.0 standard drinks    Types: 5 Glasses of wine per week    Comment: none last 24rs  . Drug use: No  . Sexual activity: Not on file  Lifestyle  .  Physical activity:    Days per week: 4 days    Minutes per session: 60 min  . Stress: Not at all  Relationships  . Social connections:    Talks on phone: More than three times a week    Gets together: More than three times a week    Attends religious service: More than 4 times per year    Active member of club or organization: Yes    Attends meetings of clubs or organizations: More than 4 times per year    Relationship status: Married  . Intimate partner violence:    Fear of current or ex partner: No    Emotionally abused: No    Physically abused: No    Forced sexual activity: No  Other Topics Concern  . Not on file  Social History Narrative    bicycle club.      No smoking.  Occasional alcohol.  Lives at home.  Retired Customer service manager.     Review of Systems    General:  No chills, fever, night sweats or weight changes.  Cardiovascular: Occasional fairly focal chest pain, no dyspnea on exertion, edema, orthopnea, palpitations, paroxysmal nocturnal dyspnea. Dermatological: No rash, lesions/masses Respiratory: No cough, dyspnea Urologic: No hematuria, dysuria Abdominal:   No nausea, vomiting, diarrhea, bright red blood per rectum,  melena, or hematemesis Neurologic:  No visual changes, wkns, changes in mental status. All other systems reviewed and are otherwise negative except as noted above.  Physical Exam    VS:  BP 126/88 (BP Location: Left Arm, Patient Position: Sitting, Cuff Size: Normal)   Pulse 67   Ht 5\' 11"  (1.803 m)   Wt 260 lb (117.9 kg)   BMI 36.26 kg/m  , BMI Body mass index is 36.26 kg/m. GEN: Well nourished, well developed, in no acute distress. HEENT: normal. Neck: Supple, no JVD, carotid bruits, or masses. Cardiac: RRR, no murmurs, rubs, or gallops. No clubbing, cyanosis, edema.  Radials/DP/PT 2+ and equal bilaterally.  Respiratory:  Respirations regular and unlabored, clear to auscultation bilaterally. GI: Soft, nontender, nondistended, BS + x 4. MS: no deformity or atrophy. Skin: warm and dry, no rash. Neuro:  Strength and sensation are intact. Psych: Normal affect.  Accessory Clinical Findings    ECG personally reviewed by me today-regular sinus rhythm, 67- No acute changes  Lab Results  Component Value Date   CHOL 169 06/26/2018   HDL 59 06/26/2018   LDLCALC 98 06/26/2018   TRIG 62 06/26/2018   CHOLHDL 2.9 06/26/2018    Assessment & Plan   1.  Chest pain/coronary artery disease: Patient presents for follow-up today after prior evaluation for atypical chest discomfort with stress testing in November that was notable for inferolateral ST segment depression.  This was followed by cardiac CTA late last month, which showed significant mid LAD and distal circumflex disease, with abnormal FFR.  He has had intermittent focal and brief episodes of left-sided chest discomfort but continues to exercise several days a week without any significant limitations.  In the setting of abnormal CT, we have recommended catheterization and he has agreed.  The patient understands that risks include but are not limited to stroke (1 in 1000), death (1 in 26), kidney failure [usually temporary] (1 in 500),  bleeding (1 in 200), allergic reaction [possibly serious] (1 in 200), and agrees to proceed.  Cont aspirin, statin (recently started), beta-blocker, and ARB therapy.  2.  Essential hypertension: Stable on beta-blocker, ARB, amlodipine, and diuretic.  3.  Hyperlipidemia: Recently placed on Lipitor therapy.  LDL was 98 in July 2019.  Plan to follow-up lipids and LFTs in about 6 weeks.  4.  Barrett's esophagus/GERD: Status post recent EGD.  Followed closely by GI and remains on PPI therapy.  5.  Morbid obesity: He will benefit from cardiac rehabilitation post catheterization.  6.  Disposition: Follow-up in clinic in approximately 3 weeks post cath.  Murray Hodgkins, NP 01/15/2019, 1:47 PM

## 2019-01-16 LAB — BASIC METABOLIC PANEL
BUN/Creatinine Ratio: 19 (ref 10–24)
BUN: 19 mg/dL (ref 8–27)
CO2: 23 mmol/L (ref 20–29)
Calcium: 9.8 mg/dL (ref 8.6–10.2)
Chloride: 97 mmol/L (ref 96–106)
Creatinine, Ser: 0.98 mg/dL (ref 0.76–1.27)
GFR calc Af Amer: 87 mL/min/{1.73_m2} (ref >59)
GFR calc non Af Amer: 76 mL/min/{1.73_m2} (ref >59)
Glucose: 112 mg/dL — ABNORMAL HIGH (ref 65–99)
Potassium: 3.9 mmol/L (ref 3.5–5.2)
Sodium: 138 mmol/L (ref 134–144)

## 2019-01-16 LAB — CBC
Hematocrit: 38.9 % (ref 37.5–51.0)
Hemoglobin: 14.4 g/dL (ref 13.0–17.7)
MCH: 34.8 pg — ABNORMAL HIGH (ref 26.6–33.0)
MCHC: 37 g/dL — ABNORMAL HIGH (ref 31.5–35.7)
MCV: 94 fL (ref 79–97)
Platelets: 262 10*3/uL (ref 150–450)
RBC: 4.14 x10E6/uL (ref 4.14–5.80)
RDW: 12 % (ref 11.6–15.4)
WBC: 6.7 10*3/uL (ref 3.4–10.8)

## 2019-01-17 ENCOUNTER — Telehealth: Payer: Self-pay

## 2019-01-17 NOTE — Telephone Encounter (Signed)
Reviewed note from provider regarding labs prior to cath. I confirmed cath scheduling with patient. He verbalized understanding and had no further questions at this time.

## 2019-01-17 NOTE — Telephone Encounter (Signed)
-----   Message from Theora Gianotti, NP sent at 01/16/2019  4:11 PM EST ----- Labs ok for cath.

## 2019-01-21 ENCOUNTER — Other Ambulatory Visit: Payer: Self-pay

## 2019-01-21 ENCOUNTER — Encounter
Admission: RE | Disposition: A | Discharge status: Home / Self Care | Payer: Self-pay | Source: Home / Self Care | Attending: Internal Medicine

## 2019-01-21 ENCOUNTER — Ambulatory Visit: Payer: Medicare Other | Admitting: Family Medicine

## 2019-01-21 ENCOUNTER — Ambulatory Visit
Admission: RE | Admit: 2019-01-21 | Discharge: 2019-01-22 | Disposition: A | Discharge status: Home / Self Care | Payer: Medicare Other | Attending: Internal Medicine | Admitting: Internal Medicine

## 2019-01-21 DIAGNOSIS — N400 Enlarged prostate without lower urinary tract symptoms: Secondary | ICD-10-CM | POA: Insufficient documentation

## 2019-01-21 DIAGNOSIS — I25118 Atherosclerotic heart disease of native coronary artery with other forms of angina pectoris: Secondary | ICD-10-CM | POA: Diagnosis not present

## 2019-01-21 DIAGNOSIS — K219 Gastro-esophageal reflux disease without esophagitis: Secondary | ICD-10-CM | POA: Insufficient documentation

## 2019-01-21 DIAGNOSIS — I10 Essential (primary) hypertension: Secondary | ICD-10-CM | POA: Insufficient documentation

## 2019-01-21 DIAGNOSIS — M109 Gout, unspecified: Secondary | ICD-10-CM | POA: Diagnosis not present

## 2019-01-21 DIAGNOSIS — Z79899 Other long term (current) drug therapy: Secondary | ICD-10-CM | POA: Insufficient documentation

## 2019-01-21 DIAGNOSIS — Z6836 Body mass index (BMI) 36.0-36.9, adult: Secondary | ICD-10-CM | POA: Insufficient documentation

## 2019-01-21 DIAGNOSIS — Z8249 Family history of ischemic heart disease and other diseases of the circulatory system: Secondary | ICD-10-CM | POA: Insufficient documentation

## 2019-01-21 DIAGNOSIS — Z9081 Acquired absence of spleen: Secondary | ICD-10-CM | POA: Insufficient documentation

## 2019-01-21 DIAGNOSIS — R931 Abnormal findings on diagnostic imaging of heart and coronary circulation: Secondary | ICD-10-CM

## 2019-01-21 DIAGNOSIS — I2584 Coronary atherosclerosis due to calcified coronary lesion: Secondary | ICD-10-CM | POA: Insufficient documentation

## 2019-01-21 DIAGNOSIS — Z7982 Long term (current) use of aspirin: Secondary | ICD-10-CM | POA: Insufficient documentation

## 2019-01-21 DIAGNOSIS — K2270 Barrett's esophagus without dysplasia: Secondary | ICD-10-CM | POA: Diagnosis not present

## 2019-01-21 DIAGNOSIS — E785 Hyperlipidemia, unspecified: Secondary | ICD-10-CM | POA: Diagnosis not present

## 2019-01-21 DIAGNOSIS — I5030 Unspecified diastolic (congestive) heart failure: Secondary | ICD-10-CM

## 2019-01-21 DIAGNOSIS — I2510 Atherosclerotic heart disease of native coronary artery without angina pectoris: Secondary | ICD-10-CM | POA: Diagnosis present

## 2019-01-21 DIAGNOSIS — I493 Ventricular premature depolarization: Secondary | ICD-10-CM | POA: Diagnosis not present

## 2019-01-21 DIAGNOSIS — I25119 Atherosclerotic heart disease of native coronary artery with unspecified angina pectoris: Secondary | ICD-10-CM

## 2019-01-21 DIAGNOSIS — R079 Chest pain, unspecified: Secondary | ICD-10-CM

## 2019-01-21 HISTORY — DX: Unspecified diastolic (congestive) heart failure: I50.30

## 2019-01-21 HISTORY — PX: CORONARY STENT INTERVENTION: CATH118234

## 2019-01-21 HISTORY — PX: LEFT HEART CATH AND CORONARY ANGIOGRAPHY: CATH118249

## 2019-01-21 LAB — CREATININE, SERUM
Creatinine, Ser: 0.85 mg/dL (ref 0.61–1.24)
GFR calc Af Amer: >60 mL/min (ref >60)
GFR calc non Af Amer: >60 mL/min (ref >60)

## 2019-01-21 LAB — CBC
HCT: 40.1 % (ref 39.0–52.0)
Hemoglobin: 13.6 g/dL (ref 13.0–17.0)
MCH: 33.5 pg (ref 26.0–34.0)
MCHC: 33.9 g/dL (ref 30.0–36.0)
MCV: 98.8 fL (ref 80.0–100.0)
Platelets: 244 10*3/uL (ref 150–400)
RBC: 4.06 MIL/uL — ABNORMAL LOW (ref 4.22–5.81)
RDW: 13 % (ref 11.5–15.5)
WBC: 6.8 10*3/uL (ref 4.0–10.5)
nRBC: 0 % (ref 0.0–0.2)

## 2019-01-21 LAB — POCT ACTIVATED CLOTTING TIME: Activated Clotting Time: 279 seconds

## 2019-01-21 SURGERY — LEFT HEART CATH AND CORONARY ANGIOGRAPHY
Anesthesia: Moderate Sedation

## 2019-01-21 MED ORDER — FEBUXOSTAT 40 MG PO TABS
40.0000 mg | ORAL_TABLET | Freq: Every day | ORAL | Status: DC
  Filled 2019-01-21 (×2): qty 1

## 2019-01-21 MED ORDER — ASPIRIN 81 MG PO CHEW
CHEWABLE_TABLET | ORAL | Status: AC
  Administered 2019-01-21: 14:00:00
  Filled 2019-01-21: qty 1

## 2019-01-21 MED ORDER — PANTOPRAZOLE SODIUM 20 MG PO TBEC
20.0000 mg | DELAYED_RELEASE_TABLET | Freq: Two times a day (BID) | ORAL | Status: DC
  Administered 2019-01-21 – 2019-01-22 (×2): 20 mg via ORAL
  Filled 2019-01-21 (×3): qty 1

## 2019-01-21 MED ORDER — PRASUGREL HCL 10 MG PO TABS
ORAL_TABLET | ORAL | Status: AC
  Filled 2019-01-21: qty 6

## 2019-01-21 MED ORDER — HEPARIN SODIUM (PORCINE) 1000 UNIT/ML IJ SOLN
INTRAMUSCULAR | Status: DC | PRN
  Administered 2019-01-21: 7000 [IU] via INTRAVENOUS
  Administered 2019-01-21: 5000 [IU] via INTRAVENOUS
  Administered 2019-01-21: 2000 [IU] via INTRAVENOUS

## 2019-01-21 MED ORDER — SODIUM CHLORIDE 0.9% FLUSH
3.0000 mL | Freq: Two times a day (BID) | INTRAVENOUS | Status: DC
  Administered 2019-01-21 – 2019-01-22 (×3): 3 mL via INTRAVENOUS

## 2019-01-21 MED ORDER — FLUOXETINE HCL 20 MG PO CAPS
20.0000 mg | ORAL_CAPSULE | Freq: Every day | ORAL | Status: DC
  Administered 2019-01-22: 20 mg via ORAL
  Filled 2019-01-21 (×2): qty 1

## 2019-01-21 MED ORDER — HEPARIN SODIUM (PORCINE) 1000 UNIT/ML IJ SOLN
INTRAMUSCULAR | Status: AC
  Filled 2019-01-21: qty 1

## 2019-01-21 MED ORDER — SODIUM CHLORIDE 0.9 % IV SOLN: 250.0000 mL | INTRAVENOUS | Status: DC | PRN

## 2019-01-21 MED ORDER — VERAPAMIL HCL 2.5 MG/ML IV SOLN
INTRAVENOUS | Status: AC
  Filled 2019-01-21: qty 2

## 2019-01-21 MED ORDER — FENTANYL CITRATE (PF) 100 MCG/2ML IJ SOLN
INTRAMUSCULAR | Status: DC | PRN
  Administered 2019-01-21 (×3): 25 ug via INTRAVENOUS

## 2019-01-21 MED ORDER — ASPIRIN 81 MG PO CHEW
CHEWABLE_TABLET | ORAL | Status: DC | PRN
  Administered 2019-01-21: 243 mg via ORAL

## 2019-01-21 MED ORDER — SODIUM CHLORIDE 0.9 % WEIGHT BASED INFUSION: 1.0000 mL/kg/h | INTRAVENOUS | Status: DC

## 2019-01-21 MED ORDER — ASPIRIN 81 MG PO CHEW
81.0000 mg | CHEWABLE_TABLET | ORAL | Status: AC
  Administered 2019-01-21: 81 mg via ORAL

## 2019-01-21 MED ORDER — VERAPAMIL HCL 2.5 MG/ML IV SOLN
INTRAVENOUS | Status: DC | PRN
  Administered 2019-01-21: 2.5 mg via INTRA_ARTERIAL

## 2019-01-21 MED ORDER — ASPIRIN 81 MG PO CHEW
CHEWABLE_TABLET | ORAL | Status: AC
  Filled 2019-01-21: qty 4

## 2019-01-21 MED ORDER — CLOPIDOGREL BISULFATE 75 MG PO TABS
75.0000 mg | ORAL_TABLET | Freq: Every day | ORAL | Status: DC
  Administered 2019-01-22: 75 mg via ORAL
  Filled 2019-01-21: qty 1

## 2019-01-21 MED ORDER — SODIUM CHLORIDE 0.9% FLUSH: 3.0000 mL | Freq: Two times a day (BID) | INTRAVENOUS | Status: DC

## 2019-01-21 MED ORDER — NITROGLYCERIN 5 MG/ML IV SOLN
INTRAVENOUS | Status: AC
  Filled 2019-01-21: qty 10

## 2019-01-21 MED ORDER — METOPROLOL TARTRATE 25 MG PO TABS
25.0000 mg | ORAL_TABLET | Freq: Two times a day (BID) | ORAL | Status: DC
  Administered 2019-01-21 – 2019-01-22 (×2): 25 mg via ORAL
  Filled 2019-01-21 (×2): qty 1

## 2019-01-21 MED ORDER — NITROGLYCERIN 1 MG/10 ML FOR IR/CATH LAB
INTRA_ARTERIAL | Status: DC | PRN
  Administered 2019-01-21 (×2): 100 ug via INTRACORONARY

## 2019-01-21 MED ORDER — ACETAMINOPHEN 325 MG PO TABS: 650.0000 mg | ORAL_TABLET | ORAL | Status: DC | PRN

## 2019-01-21 MED ORDER — ASPIRIN EC 81 MG PO TBEC
81.0000 mg | DELAYED_RELEASE_TABLET | Freq: Every day | ORAL | Status: DC
  Administered 2019-01-22: 81 mg via ORAL
  Filled 2019-01-21: qty 1

## 2019-01-21 MED ORDER — ONDANSETRON HCL 4 MG/2ML IJ SOLN: 4.0000 mg | Freq: Four times a day (QID) | INTRAMUSCULAR | Status: DC | PRN

## 2019-01-21 MED ORDER — FENTANYL CITRATE (PF) 100 MCG/2ML IJ SOLN
INTRAMUSCULAR | Status: AC
  Filled 2019-01-21: qty 2

## 2019-01-21 MED ORDER — AMLODIPINE BESYLATE 5 MG PO TABS
10.0000 mg | ORAL_TABLET | Freq: Every day | ORAL | Status: DC
  Administered 2019-01-22: 10 mg via ORAL
  Filled 2019-01-21: qty 2

## 2019-01-21 MED ORDER — ATORVASTATIN CALCIUM 20 MG PO TABS
40.0000 mg | ORAL_TABLET | Freq: Every day | ORAL | Status: DC
  Administered 2019-01-21 – 2019-01-22 (×2): 40 mg via ORAL
  Filled 2019-01-21 (×2): qty 2

## 2019-01-21 MED ORDER — ENOXAPARIN SODIUM 40 MG/0.4ML ~~LOC~~ SOLN: 40.0000 mg | SUBCUTANEOUS | Status: DC

## 2019-01-21 MED ORDER — LABETALOL HCL 5 MG/ML IV SOLN: 10.0000 mg | INTRAVENOUS | Status: AC | PRN

## 2019-01-21 MED ORDER — MIDAZOLAM HCL 2 MG/2ML IJ SOLN
INTRAMUSCULAR | Status: DC | PRN
  Administered 2019-01-21 (×2): 1 mg via INTRAVENOUS

## 2019-01-21 MED ORDER — IOPAMIDOL (ISOVUE-300) INJECTION 61%
INTRAVENOUS | Status: DC | PRN
  Administered 2019-01-21: 140 mL via INTRA_ARTERIAL

## 2019-01-21 MED ORDER — LORAZEPAM 0.5 MG PO TABS: 0.5000 mg | ORAL_TABLET | Freq: Every day | ORAL | Status: DC | PRN

## 2019-01-21 MED ORDER — PRASUGREL HCL 10 MG PO TABS
ORAL_TABLET | ORAL | Status: DC | PRN
  Administered 2019-01-21: 60 mg via ORAL

## 2019-01-21 MED ORDER — SODIUM CHLORIDE 0.9 % IV SOLN
INTRAVENOUS | Status: AC
  Administered 2019-01-21: 13:00:00 via INTRAVENOUS

## 2019-01-21 MED ORDER — HYDRALAZINE HCL 20 MG/ML IJ SOLN: 5.0000 mg | INTRAMUSCULAR | Status: AC | PRN

## 2019-01-21 MED ORDER — LOSARTAN POTASSIUM 50 MG PO TABS
100.0000 mg | ORAL_TABLET | Freq: Every day | ORAL | Status: DC
  Administered 2019-01-22: 100 mg via ORAL
  Filled 2019-01-21: qty 2

## 2019-01-21 MED ORDER — SODIUM CHLORIDE 0.9% FLUSH: 3.0000 mL | INTRAVENOUS | Status: DC | PRN

## 2019-01-21 MED ORDER — HEPARIN (PORCINE) IN NACL 1000-0.9 UT/500ML-% IV SOLN
INTRAVENOUS | Status: DC | PRN
  Administered 2019-01-21: 1000 mL

## 2019-01-21 MED ORDER — SODIUM CHLORIDE 0.9 % WEIGHT BASED INFUSION
3.0000 mL/kg/h | INTRAVENOUS | Status: DC
  Administered 2019-01-21: 3 mL/kg/h via INTRAVENOUS

## 2019-01-21 MED ORDER — HEPARIN (PORCINE) IN NACL 1000-0.9 UT/500ML-% IV SOLN
INTRAVENOUS | Status: AC
  Filled 2019-01-21: qty 1000

## 2019-01-21 MED ORDER — MIDAZOLAM HCL 2 MG/2ML IJ SOLN
INTRAMUSCULAR | Status: AC
  Filled 2019-01-21: qty 2

## 2019-01-21 SURGICAL SUPPLY — 17 items
BALLN TREK RX 2.5X8 (BALLOONS) ×4
BALLN ~~LOC~~ TREK RX 3.0X8 (BALLOONS) ×4
BALLOON TREK RX 2.5X8 (BALLOONS) ×2 IMPLANT
BALLOON ~~LOC~~ TREK RX 3.0X8 (BALLOONS) ×2 IMPLANT
CATH INFINITI 5 FR JL3.5 (CATHETERS) ×4 IMPLANT
CATH INFINITI 5FR ANG PIGTAIL (CATHETERS) ×4 IMPLANT
CATH INFINITI 5FR TG (CATHETERS) ×4 IMPLANT
CATH LAUNCHER 6FR EBU3.5 (CATHETERS) ×4 IMPLANT
DEVICE INFLAT 30 PLUS (MISCELLANEOUS) ×4 IMPLANT
DEVICE RAD COMP TR BAND LRG (VASCULAR PRODUCTS) ×4 IMPLANT
GLIDESHEATH SLEND SS 6F .021 (SHEATH) ×4 IMPLANT
KIT MANI 3VAL PERCEP (MISCELLANEOUS) ×4 IMPLANT
PACK CARDIAC CATH (CUSTOM PROCEDURE TRAY) ×4 IMPLANT
STENT RESOLUTE ONYX 2.75X15 (Permanent Stent) ×4 IMPLANT
WIRE ASAHI PROWATER 180CM (WIRE) ×4 IMPLANT
WIRE ROSEN-J .035X260CM (WIRE) ×4 IMPLANT
WIRE RUNTHROUGH .014X180CM (WIRE) ×4 IMPLANT

## 2019-01-21 NOTE — Interval H&P Note (Signed)
History and Physical Interval Note:  01/21/2019 7:30 AM  Jesse Norton  has presented today for cardiac catheterization, with the diagnosis of stable angina and abnormal cardiac CTA. The various methods of treatment have been discussed with the patient and family. After consideration of risks, benefits and other options for treatment, the patient has consented to  Procedure(s): LEFT HEART CATH AND CORONARY ANGIOGRAPHY (Left) as a surgical intervention .  The patient's history has been reviewed, patient examined, no change in status, stable for surgery.  I have reviewed the patient's chart and labs.  Questions were answered to the patient's satisfaction.    Cath Lab Visit (complete for each Cath Lab visit)  Clinical Evaluation Leading to the Procedure:   ACS: No.  Non-ACS:    Anginal Classification: CCS II  Anti-ischemic medical therapy: Maximal Therapy (2 or more classes of medications)  Non-Invasive Test Results: Intermediate-risk stress test findings: cardiac mortality 1-3%/year  Prior CABG: No previous CABG  Jesse Norton

## 2019-01-21 NOTE — Brief Op Note (Signed)
BRIEF CARDIAC CATHETERIZATION NOTE  01/21/2019  9:04 AM  PATIENT:  Jesse Norton  73 y.o. male  PRE-OPERATIVE DIAGNOSIS:  LT Heart Cath   Abnormal Heart CT  Chest Pain  POST-OPERATIVE DIAGNOSIS:  * No post-op diagnosis entered *  PROCEDURE:  Procedure(s): LEFT HEART CATH AND CORONARY ANGIOGRAPHY (Left) CORONARY STENT INTERVENTION (N/A)  SURGEON:  Surgeon(s) and Role:    Nelva Bush, MD - Primary  FINDINGS: 1. Significant 2-vessel coronary artery disease with calcified 80-90% mid LAD stenosis and focal 70% proximal LCx lesion. 2. Normal LVEF with mildly elevated LVEDP. 3. Successful PCI to LCx using Resolute Onyx 2.75 x 15 mm DES with 0% residual stenosis and TIMI-3 flow.  RECOMMENDATIONS: 1. Overnight extended recovery. 2. Plan for stage PCI to mid LAD in 1-2 weeks at [facility], as intervention may require atherectomy. 3. Aggressive secondary prevention. 4. DAPT with ASA and clopidogrel for at least 6 months.  Nelva Bush, MD [facility] Pager: [phone]

## 2019-01-22 ENCOUNTER — Other Ambulatory Visit: Payer: Self-pay | Admitting: Physician Assistant

## 2019-01-22 ENCOUNTER — Encounter: Payer: Self-pay | Admitting: Internal Medicine

## 2019-01-22 ENCOUNTER — Telehealth: Payer: Self-pay | Admitting: *Deleted

## 2019-01-22 DIAGNOSIS — R931 Abnormal findings on diagnostic imaging of heart and coronary circulation: Secondary | ICD-10-CM | POA: Diagnosis not present

## 2019-01-22 DIAGNOSIS — I25119 Atherosclerotic heart disease of native coronary artery with unspecified angina pectoris: Secondary | ICD-10-CM

## 2019-01-22 DIAGNOSIS — Z9889 Other specified postprocedural states: Secondary | ICD-10-CM | POA: Diagnosis not present

## 2019-01-22 DIAGNOSIS — R079 Chest pain, unspecified: Secondary | ICD-10-CM | POA: Diagnosis not present

## 2019-01-22 DIAGNOSIS — I25118 Atherosclerotic heart disease of native coronary artery with other forms of angina pectoris: Secondary | ICD-10-CM | POA: Diagnosis not present

## 2019-01-22 DIAGNOSIS — I2584 Coronary atherosclerosis due to calcified coronary lesion: Secondary | ICD-10-CM | POA: Diagnosis not present

## 2019-01-22 DIAGNOSIS — E876 Hypokalemia: Secondary | ICD-10-CM

## 2019-01-22 DIAGNOSIS — I10 Essential (primary) hypertension: Secondary | ICD-10-CM | POA: Diagnosis not present

## 2019-01-22 DIAGNOSIS — E785 Hyperlipidemia, unspecified: Secondary | ICD-10-CM | POA: Diagnosis not present

## 2019-01-22 DIAGNOSIS — Z01810 Encounter for preprocedural cardiovascular examination: Secondary | ICD-10-CM

## 2019-01-22 LAB — BASIC METABOLIC PANEL
Anion gap: 3 — ABNORMAL LOW (ref 5–15)
BUN: 18 mg/dL (ref 8–23)
CO2: 30 mmol/L (ref 22–32)
Calcium: 8.8 mg/dL — ABNORMAL LOW (ref 8.9–10.3)
Chloride: 105 mmol/L (ref 98–111)
Creatinine, Ser: 0.84 mg/dL (ref 0.61–1.24)
GFR calc Af Amer: >60 mL/min (ref >60)
GFR calc non Af Amer: >60 mL/min (ref >60)
GLUCOSE: 118 mg/dL — AB (ref 70–99)
Potassium: 3.4 mmol/L — ABNORMAL LOW (ref 3.5–5.1)
Sodium: 138 mmol/L (ref 135–145)

## 2019-01-22 LAB — CBC
HCT: 38.4 % — ABNORMAL LOW (ref 39.0–52.0)
Hemoglobin: 13 g/dL (ref 13.0–17.0)
MCH: 33.3 pg (ref 26.0–34.0)
MCHC: 33.9 g/dL (ref 30.0–36.0)
MCV: 98.5 fL (ref 80.0–100.0)
Platelets: 234 10*3/uL (ref 150–400)
RBC: 3.9 MIL/uL — ABNORMAL LOW (ref 4.22–5.81)
RDW: 12.9 % (ref 11.5–15.5)
WBC: 7.8 10*3/uL (ref 4.0–10.5)
nRBC: 0 % (ref 0.0–0.2)

## 2019-01-22 MED ORDER — FUROSEMIDE 20 MG PO TABS: 20.0000 mg | ORAL_TABLET | Freq: Every day | ORAL | 0 refills | Status: DC

## 2019-01-22 MED ORDER — METOPROLOL TARTRATE 25 MG PO TABS: 25.0000 mg | ORAL_TABLET | Freq: Two times a day (BID) | ORAL | 3 refills | Status: DC

## 2019-01-22 MED ORDER — POTASSIUM CHLORIDE ER 10 MEQ PO TBCR: 10.0000 meq | EXTENDED_RELEASE_TABLET | Freq: Every day | ORAL | 0 refills | Status: DC

## 2019-01-22 MED ORDER — CLOPIDOGREL BISULFATE 75 MG PO TABS: 75.0000 mg | ORAL_TABLET | Freq: Every day | ORAL | 3 refills | Status: DC

## 2019-01-22 NOTE — Plan of Care (Signed)
PIV x2 removed from left arm and wrist.  Tips intact.  Dressings applied.  Telemetry removed.  Pt escorted via W/C to POV for d/c with family.

## 2019-01-22 NOTE — Discharge Summary (Signed)
Discharge Summary    Patient ID: Jesse Norton MRN: [id number]; DOB: [birth year]  Admit date: 01/21/2019 Discharge date: 01/22/2019  Primary Care Provider: [name], MD  Primary Cardiologist: Nelva Bush, MD  Primary Electrophysiologist:  None   Discharge Diagnoses    Principal Problem:   Coronary artery disease with stable angina pectoris [facility]) Active Problems:   Abnormal CT scan of heart   Coronary artery disease of native artery of native heart with stable angina pectoris ([place])   Allergies No Known Allergies  Diagnostic Studies/Procedures    Coronary Findings   Diagnostic  Dominance: Right  Left Main  Vessel is large. Vessel is angiographically normal.  Left Anterior Descending  Vessel is large.  Prox LAD lesion 40% stenosed  Prox LAD lesion is 40% stenosed. The lesion is severely calcified.  Mid LAD lesion 85% stenosed  Mid LAD lesion is 85% stenosed. The lesion is focal. The lesion is severely calcified.  First Diagonal Branch  Vessel is moderate in size.  Second Diagonal Branch  Vessel is small in size.  2nd Diag lesion 70% stenosed  2nd Diag lesion is 70% stenosed.  Left Circumflex  Vessel is large.  Prox Cx lesion 70% stenosed  Prox Cx lesion is 70% stenosed. The lesion is mildly calcified.  First Obtuse Marginal Branch  Vessel is small in size.  Second Obtuse Marginal Branch  Vessel is moderate in size.  Third Obtuse Marginal Branch  Vessel is small in size.  Right Coronary Artery  Vessel is large. The vessel exhibits minimal luminal irregularities.  Intervention   Prox Cx lesion  Stent  Lesion length: 10 mm. CATHETER LAUNCHER 6FREBU 3.5 guide catheter was inserted. Lesion crossed with guidewire using a WIRE RUNTHROUGH .[id number]. Pre-stent angioplasty was performed using a BALLOON TREK RX 2.5X8. Maximum pressure: 8 atm. A drug-eluting stent was successfully placed using a STENT RESOLUTE ONYX [id number]. Maximum pressure: 16 atm.  Post-stent angioplasty was performed using a BALLOON Trainer TREK RX 3.0X8. Maximum pressure: 16 atm.  Post-Intervention Lesion Assessment  The intervention was successful. Pre-interventional TIMI flow is 3. Post-intervention TIMI flow is 3. No complications occurred at this lesion.  There is a 0% residual stenosis post intervention.  Wall Motion   Resting               Left Heart   Left Ventricle The left ventricular size is normal. The left ventricular systolic function is normal. LV end diastolic pressure is moderately elevated. LVEDP 25-30 mmHg. The left ventricular ejection fraction is 55-65% by visual estimate.  Aortic Valve There is no aortic valve stenosis.  Coronary Diagrams   Diagnostic  Dominance: Right    Intervention     PRE-OPERATIVE DIAGNOSIS:  LT Heart Cath   Abnormal Heart CT  Chest Pain PROCEDURE:  Procedure(s): LEFT HEART CATH AND CORONARY ANGIOGRAPHY (Left) CORONARY STENT INTERVENTION (N/A)  SURGEON:  Surgeon(s) and Role:    * End, Harrell Gave, MD - Primary FINDINGS: 1. Significant 2-vessel coronary artery disease with calcified 80-90% mid LAD stenosis and focal 70% proximal LCx lesion. 2. Normal LVEF with mildly elevated LVEDP. 3. Successful PCI to LCx using Resolute Onyx 2.75 x 15 mm DES with 0% residual stenosis and TIMI-3 flow. RECOMMENDATIONS: 1. Overnight extended recovery. 2. Plan for stage PCI to mid LAD in 1-2 weeks at [facility], as intervention may require atherectomy. 3. Aggressive secondary prevention. 4. DAPT with ASA and clopidogrel for at least 6 months. _____________   History of Present Illness  73 yo male with history of CAD with stable angina HTN, barrett's esophagus, gout, BPH, and obesity. History of chest pain that dates back several years with 2 emergency department visits in the past. In July 2019, he was seen at [facility] emergency department with left sided chest pain that radiated to his arm.  Symptoms were accompanied by nausea  and dyspnea.  ER evaluation was unremarkable.  He was subsequently referred to Dr. Saunders Revel, [facility], who saw him on September 19, 2018 and subsequently underwent exercise treadmill stress testing on November 2019, showing good exercise tolerance, but notable for 1 mm horizontal inferior lateral ST segment depression as well as frequent PVCs.  In the setting of this intermittent study, recommendation was made for cardiac CT angiography.  CTA performed December 23, 2018 and showed significant mid LAD disease with fractional flow reserve of 0.6.  Significant distal left circumflex disease was also noted.  In the setting, cardiac catheterization was recommended.  He was seen in the office on 01/15/2019 and reported occasional episodes of very focal/pinpoint left-sided chest discomfort which would last a second or so at a time.  He reported exercising several days a week and stated he typically did not experience chest pain or activity limiting dyspnea.  He denied palpitations, PND, orthopnea, dizziness, syncope, edema, or early satiety.  Cardiac catheterization was scheduled for 01/21/2019 at [facility] hospital with Dr. Harrell Gave End.  Hospital Course    He presented for left heart catheterization with coronary angiography given diagnosis of stable angina and abnormal cardiac CTA on 01/21/2019.  Cardiac catheterization revealed significant two-vessel coronary artery disease with calcified 80 to 90% mid LAD stenosis and focal 70% proximal left circumflex lesion.  Normal left ventricular ejection fraction with mildly elevated left ventricular end-diastolic pressure were noted.  Successful PCI was performed left circumflex with resolute onyx 2.75 x 15 mm drug-eluting stent with 0% residual stenosis and TIMI-3 flow.  Recommendations were for overnight recovery and plan for staged PCI to the mid LAD in 1 to 2 weeks at [facility] in [place], as intervention may require atherectomy.  Aggressive secondary prevention was  recommended.  Patient did have slight bleeding with hematoma at catheterization site that resolved with pressure held overnight. Patient will be discharged home on dual antiplatelet therapy with ASA and clopidogrel for at least 6 months. Additional 30 days (no refills) Lasix 20 mg with 52mq potassium supplementation and follow-up BMET lab ordered for within two weeks as below. MD has seen and examined the patient today and feels patient is stable for discharge. Labs reviewed, follow-up staged PCI to be scheduled thus TCM deferred for now. Right radial cath site examined and deemed stable for discharge.   Patient informed that medications called into pharmacy with new medications reviewed and upcoming staged PCI reviewed as well.   Consultants: Cardiology   _____________  Discharge Vitals Blood pressure 137/78, pulse 63, temperature 97.8 F (36.6 C), temperature source Oral, resp. rate 20, height 5' 11" (1.803 m), weight 109.2 kg, SpO2 98 %.  Filed Weights   01/21/19 0636 01/21/19 1528  Weight: 118 kg 109.2 kg    Labs & Radiologic Studies    CBC Recent Labs    01/21/19 1235 01/22/19 0455  WBC 6.8 7.8  HGB 13.6 13.0  HCT 40.1 38.4*  MCV 98.8 98.5  PLT 244 2889  Basic Metabolic Panel Recent Labs    01/21/19 1235 01/22/19 0455  NA  --  138  K  --  3.4*  CL  --  105  CO2  --  30  GLUCOSE  --  118*  BUN  --  18  CREATININE 0.85 0.84  CALCIUM  --  8.8*   Liver Function Tests No results for input(s): AST, ALT, ALKPHOS, BILITOT, PROT, ALBUMIN in the last 72 hours. No results for input(s): LIPASE, AMYLASE in the last 72 hours. Cardiac Enzymes No results for input(s): CKTOTAL, CKMB, CKMBINDEX, TROPONINI in the last 72 hours. BNP Invalid input(s): POCBNP D-Dimer No results for input(s): DDIMER in the last 72 hours. Hemoglobin A1C No results for input(s): HGBA1C in the last 72 hours. Fasting Lipid Panel No results for input(s): CHOL, HDL, LDLCALC, TRIG, CHOLHDL, LDLDIRECT in  the last 72 hours. Thyroid Function Tests No results for input(s): TSH, T4TOTAL, T3FREE, THYROIDAB in the last 72 hours.  Invalid input(s): FREET3 _____________  Ct Coronary Morph W/cta Cor W/score W/ca W/cm &/or Wo/cm  Addendum Date: 12/24/2018   ADDENDUM REPORT: 12/24/2018 18:24 EXAM: CT FFR ANALYSIS CLINICAL DATA:  42M with hypertension, chest pain and abnormal coronary CT-A. FINDINGS: FFRct analysis was performed on the original cardiac CT angiogram dataset. Diagrammatic representation of the FFRct analysis is provided in a separate PDF document in PACS. This dictation was created using the PDF document and an interactive 3D model of the results. 3D model is not available in the EMR/PACS. Normal FFR range is >0.80. 1. Left Main:  No significant stenosis.  FFRct 0.99. 2. LAD: FFRct 0.6 in the mid LAD, consistent with severe stenosis. 3. LCX: FFRct 0.99 proximal, 0.87 mid, 0.77 distal. Concerning for severe mid to distal stenosis. 4. RCA: No significant stenosis.  FFRct 0.89 distal. IMPRESSION: 1.  CT FFR analysis is consistent with severe mid LAD disease. 2.  Cannot rule out severe mid to distal LCX disease. 3.  Recommendation cardiac catheterization. Electronically Signed   By: Skeet Latch   On: 12/24/2018 18:24   Addendum Date: 12/23/2018   ADDENDUM REPORT: 12/23/2018 16:09 CLINICAL DATA:  42M with hypertension and chest pain. EXAM: Cardiac/Coronary  CT TECHNIQUE: The patient was scanned on a [employer]. FINDINGS: A 120 kV prospective scan was triggered in the descending thoracic aorta at 111 HU's. Axial non-contrast 3 mm slices were carried out through the heart. The data set was analyzed on a dedicated work station and scored using the [place]. Gantry rotation speed was 250 msecs and collimation was .6 mm. No beta blockade and 0.8 mg of sl NTG was given. The 3D data set was reconstructed in 5% intervals of the 67-82 % of the R-R cycle. Diastolic phases were analyzed on a  dedicated work station using MPR, MIP and VRT modes. The patient received 80 cc of contrast. Aorta: Ascending aorta is at the upper limit of normal (3.65cm). Mild calcification of the aortic root. No dissection. Aortic Valve:  Trileaflet.  No calcifications. Coronary Arteries:  Normal coronary origin.  Right dominance. RCA is a large dominant artery that gives rise to PDA and two PLVB. There is mild (25-49%) calcified plaque in the ostium and proximally. There is moderate (50-69%) mixed attenuation plaque in the mid and distal RDA. There is moderate mixed attenuation plaque in the proximal second PLV branch. Left main is a large artery that gives rise to LAD and LCX arteries. There is minimal (1-24%) calcified plaque. LAD is a large vessel that is heavily calcified. There is diffuse, severe (>70%) calcified plaque in the proximal and mid LAD. There is mild calcified plaque distally.  There is at least moderate calcified plaque in proximal D1. LCX is a non-dominant artery that gives rise to one large OM1 branch. There is severe calcified plaque in the proximal to mid LCX. Other findings: Normal pulmonary vein drainage into the left atrium. Normal let atrial appendage without a thrombus. Normal size of the pulmonary artery. IMPRESSION: 1. Coronary calcium score of 2408. This was 93rd percentile for age and sex matched control. 2. Normal coronary origin with right dominance. 3. Three vessel CAD. 4. Will send study for FFRct. 5. Given the high calcium score, concern for obstructive disease, would recommend cardiac catheterization. Skeet Latch, MD Electronically Signed   By: Skeet Latch   On: 12/23/2018 16:09   Result Date: 12/24/2018 EXAM: OVER-READ INTERPRETATION  CT CHEST The following report is an over-read performed by radiologist Dr. Rolm Baptise of [facility], [place] on 12/23/2018. This over-read does not include interpretation of cardiac or coronary anatomy or pathology. The coronary CTA  interpretation by the cardiologist is attached. COMPARISON:  None. FINDINGS: Vascular: Heart is normal size.  Aorta normal caliber. Mediastinum/Nodes: No adenopathy in the lower mediastinum or hila. Lungs/Pleura: No confluent opacities or effusions. Upper Abdomen: Imaging into the upper abdomen shows no acute findings. Musculoskeletal: Chest wall soft tissues are unremarkable. No acute bony abnormality. IMPRESSION: No acute or significant extracardiac abnormality. Electronically Signed: By: Rolm Baptise M.D. On: 12/23/2018 12:11   Disposition   Pt seen and examined and is being discharged home today in good condition.   Physical Exam  Constitutional: He is oriented to person, place, and time. He appears well-developed and well-nourished. No distress.  HENT:  Head: Normocephalic and atraumatic.  Cardiovascular: Regular rhythm, normal heart sounds and intact distal pulses. Exam reveals no gallop and no friction rub.  No murmur heard. At times bradycardic rate, normal rhythm  Pulmonary/Chest: Effort normal and breath sounds normal. No respiratory distress. He has no wheezes. He has no rales. He exhibits no tenderness.  Musculoskeletal: Normal range of motion.  Neurological: He is alert and oriented to person, place, and time.  Skin: Skin is warm and dry. He is not diaphoretic.  R radial cath site with ecchymosis and swelling due to bleeding last night that resolved with pressure / hematoma   Psychiatric: He has a normal mood and affect. Judgment normal.    Follow-up Plans & Appointments    Follow-up Information    [facility] Follow up.   Specialty:  Cardiac Rehabilitation Why:  Your cardiologist has referred you to outpatient Cardiac Rehab.  The Cardiac Rehab dept will contact you by phone within one to two weeks of discharge to schedule your first appointment.   Contact information: [place] [phone] [address] [place]  [place] [phone]         Discharge Instructions    AMB Referral to Cardiac Rehabilitation - Phase II   Complete by:  As directed    Diagnosis:  Coronary Stents   Call MD for:  extreme fatigue   Complete by:  As directed    Call MD for:  persistant dizziness or light-headedness   Complete by:  As directed    Call MD for:  persistant nausea and vomiting   Complete by:  As directed    Call MD for:  redness, tenderness, or signs of infection (pain, swelling, redness, odor or green/yellow discharge around incision site)   Complete by:  As directed    Call MD for:  severe uncontrolled  pain   Complete by:  As directed    Call MD for:  temperature >100.4   Complete by:  As directed    Diet - low sodium heart healthy   Complete by:  As directed    Increase activity slowly   Complete by:  As directed       Discharge Medications   Allergies as of 01/22/2019   No Known Allergies     Medication List    STOP taking these medications   hydrochlorothiazide 25 MG tablet Commonly known as:  HYDRODIURIL     TAKE these medications   amLODipine 10 MG tablet Commonly known as:  NORVASC Take 1 tablet (10 mg total) by mouth daily.   aspirin EC 81 MG tablet Take 81 mg by mouth daily.   atorvastatin 40 MG tablet Commonly known as:  LIPITOR Take 1 tablet (40 mg total) by mouth daily.   BIOFLEX PO Take 1 tablet by mouth daily.   clopidogrel 75 MG tablet Commonly known as:  PLAVIX Take 1 tablet (75 mg total) by mouth daily with breakfast. Start taking on:  January 23, 2019   EMERGEN-C IMMUNE PO Take 1 tablet by mouth every other day.   febuxostat 40 MG tablet Commonly known as:  ULORIC Take 1 tablet (40 mg total) by mouth daily.   FLUoxetine 20 MG capsule Commonly known as:  PROZAC Take 1 capsule (20 mg total) by mouth daily.   furosemide 20 MG tablet Commonly known as:  LASIX Take 1 tablet (20 mg total) by mouth daily for 30 days.   LORazepam 1 MG tablet Commonly  known as:  ATIVAN Take 0.5-1 tablets (0.5-1 mg total) by mouth daily as needed for anxiety.   losartan 100 MG tablet Commonly known as:  COZAAR Take 1 tablet (100 mg total) by mouth daily.   metoprolol tartrate 25 MG tablet Commonly known as:  LOPRESSOR Take 1 tablet (25 mg total) by mouth 2 (two) times daily.   multivitamin tablet Take 1 tablet by mouth daily.   pantoprazole 20 MG tablet Commonly known as:  PROTONIX Take 1 tablet (20 mg total) by mouth 2 (two) times daily.   potassium chloride 10 MEQ tablet Commonly known as:  K-DUR Take 1 tablet (10 mEq total) by mouth daily.        Acute coronary syndrome (MI, NSTEMI, STEMI, etc) this admission?: No.    Outstanding Labs/Studies   Follow-up BMET ordered for within 2 weeks as hypokalemic on today's metabolic panel with recommendation for KCl supplementation and lasix. Staged PCI within 1-2 weeks. TCM deferred at this time d/t upcoming staged PCI  Duration of Discharge Encounter   Greater than 30 minutes including physician time.  Signed, Arvil Chaco, PA-C 01/22/2019, 11:30 AM

## 2019-01-22 NOTE — Progress Notes (Signed)
Progress Note  Patient Name: Jesse Norton Date of Encounter: 01/22/2019  Primary Cardiologist: Nelva Bush, MD   Subjective   No significant chest pain overnight Swelling right wrist, mild tenderness Ambulating around the hallway without shortness of breath or chest pain Denies significant leg swelling abdominal bloating PND orthopnea  Inpatient Medications    Scheduled Meds: . amLODipine  10 mg Oral Daily  . aspirin EC  81 mg Oral Daily  . atorvastatin  40 mg Oral Daily  . clopidogrel  75 mg Oral Q breakfast  . enoxaparin (LOVENOX) injection  40 mg Subcutaneous Q24H  . febuxostat  40 mg Oral Daily  . FLUoxetine  20 mg Oral Daily  . losartan  100 mg Oral Daily  . metoprolol tartrate  25 mg Oral BID  . pantoprazole  20 mg Oral BID  . sodium chloride flush  3 mL Intravenous Q12H   Continuous Infusions: . sodium chloride     PRN Meds: sodium chloride, acetaminophen, LORazepam, ondansetron (ZOFRAN) IV, sodium chloride flush   Vital Signs    Vitals:   01/21/19 1528 01/21/19 2012 01/22/19 0437 01/22/19 0726  BP: (!) 144/83 (!) 144/84 136/84 137/78  Pulse: 65 68 65 63  Resp: 18 18 20    Temp: 97.6 F (36.4 C) 97.9 F (36.6 C) 97.7 F (36.5 C) 97.8 F (36.6 C)  TempSrc: Oral Oral Oral Oral  SpO2: 97% 96% 97% 98%  Weight: 109.2 kg     Height: 5\' 11"  (1.803 m)       Intake/Output Summary (Last 24 hours) at 01/22/2019 1143 Last data filed at 01/22/2019 0904 Gross per 24 hour  Intake 720 ml  Output 0 ml  Net 720 ml   Last 3 Weights 01/21/2019 01/21/2019 01/15/2019  Weight (lbs) 240 lb 11.2 oz 260 lb 2.3 oz 260 lb  Weight (kg) 109.181 kg 118 kg 117.935 kg      Telemetry    Normal sinus rhythm- Personally Reviewed  ECG     - Personally Reviewed  Physical Exam   Constitutional:  oriented to person, place, and time. No distress.  HENT:  Head: Grossly normal Eyes:  no discharge. No scleral icterus.  Neck: No JVD, no carotid bruits  Cardiovascular:  Regular rate and rhythm, no murmurs appreciated Pulmonary/Chest: Clear to auscultation bilaterally, no wheezes or rails Abdominal: Soft.  no distension.  no tenderness.  Musculoskeletal: Normal range of motion Small region of ecchymosis right wrist bandage in place Neurological:  normal muscle tone. Coordination normal. No atrophy Skin: Skin warm and dry Psychiatric: normal affect, pleasant   Labs    Chemistry Recent Labs  Lab 01/21/19 1235 01/22/19 0455  NA  --  138  K  --  3.4*  CL  --  105  CO2  --  30  GLUCOSE  --  118*  BUN  --  18  CREATININE 0.85 0.84  CALCIUM  --  8.8*  GFRNONAA >60 >60  GFRAA >60 >60  ANIONGAP  --  3*     Hematology Recent Labs  Lab 01/21/19 1235 01/22/19 0455  WBC 6.8 7.8  RBC 4.06* 3.90*  HGB 13.6 13.0  HCT 40.1 38.4*  MCV 98.8 98.5  MCH 33.5 33.3  MCHC 33.9 33.9  RDW 13.0 12.9  PLT 244 234    Cardiac EnzymesNo results for input(s): TROPONINI in the last 168 hours. No results for input(s): TROPIPOC in the last 168 hours.   BNPNo results for input(s): BNP, PROBNP in  the last 168 hours.   DDimer No results for input(s): DDIMER in the last 168 hours.   Radiology    No results found.  Cardiac Studies   Cath2/10/2019 1. Significant 2-vessel coronary artery disease, including 80-90% mid LAD stenosis with heavy calcification, as well as 70% proximal LCx stenosis. 2. Normal left ventricular systolic function. 3. Moderately elevated left ventricular filling pressure. 4. Successful PCI to proximal LCx using Resolute Onyx 2.75 x 15 mm drug-eluting stent (postdilated to 3.1 mm proximally) with 0% residual stenosis and TIMI-3 flow.  Recommendations: 1. Given significant mid LAD disease with heavy calcification, we will plan for staged PCI using orbital atherectomy in ~2 weeks. 2. Dual antiplatelet therapy with aspirin and clopidogrel for at least 6 months. 3. Aggressive secondary prevention. 4. If renal function allows, consider  beginning gentle diuresis tomorrow for an element of HFpEF. 5. Overnight extended recovery.   Patient Profile     73 year old male with a history of hypertension, Barrett's esophagus, gout, BPH, and obesity,  chest pain and recent abnormal cardiac CT. Cardiac catheterization yesterday showing severe disease of left circumflex and LAD Had stent placed to left circumflex plan for staged procedure to the LAD in 2 weeks time  Assessment & Plan    Coronary artery disease with stable angina Stent placed yesterday to left circumflex Plan is for PCI to the LAD in [facility] with orbital atherectomy -Stressed importance of staying on his low-dose aspirin with Plavix Statin recently started January 2020 He is also on beta-blocker/metoprolol  Hypertension On amlodipine , losartan, Toprol    Total encounter time more than 25 minutes  Greater than 50% was spent in counseling and coordination of care with the patient  For questions or updates, please contact [place] Please consult [email or web address] for contact info under        Signed, Ida Rogue, MD  01/22/2019, 11:43 AM

## 2019-01-22 NOTE — Discharge Instructions (Signed)
You have received care from [facility] during this hospital stay and we look forward to continuing to provide you with excellent care in our office settings after you've left the hospital.  Our advanced practice providers work closely with your physician in order to address all of your heart's needs in a timely manner.  More information about all of our providers may be found here: [email or web address]  You should be contacted regarding the next steps following your cardiac catheterization this admission. Please call the office if you have any questions about any upcoming appointments or procedures.    Please plan to bring all of your prescriptions to your follow-up appointment and don't hesitate to contact us with questions or concerns.  [address], [place] [address] --------------------------------------  CATHETERIZATION: NO HEAVY LIFTING X 4 WEEKS. NO SEXUAL ACTIVITY X 4 WEEKS. NO SOAKING BATHS, HOT TUBS, POOLS, ETC., X 7 DAYS.   Radial Site Care Refer to this sheet in the next few weeks. These instructions provide you with information on caring for yourself after your procedure. Your caregiver may also give you more specific instructions. Your treatment has been planned according to current medical practices, but problems sometimes occur. Call your caregiver if you have any problems or questions after your procedure. HOME CARE INSTRUCTIONS  You may shower the day after the procedure. Remove the bandage (dressing) and gently wash the site with plain soap and water. Gently pat the site dry.   Do not apply powder or lotion to the site.   Do not submerge the affected site in water for 3 to 5 days.   Inspect the site at least twice daily.   Do not flex or bend the affected arm for 24 hours.   No lifting over 5 pounds (2.3 kg) for 5  days after your procedure.  What to expect:  Any bruising will usually fade within 1 to 2 weeks.   Blood that collects in the tissue (hematoma) may be painful to the touch. It should usually decrease in size and tenderness within 1 to 2 weeks.  SEEK IMMEDIATE MEDICAL CARE IF:  You have unusual pain at the radial site.   You have redness, warmth, swelling, or pain at the radial site.   You have drainage (other than a small amount of blood on the dressing).   You have chills.   You have a fever or persistent symptoms for more than 72 hours.   You have a fever and your symptoms suddenly get worse.   Your arm becomes pale, cool, tingly, or numb.   You have heavy bleeding from the site. Hold pressure on the site.   Groin Site Care Refer to this sheet in the next few weeks. These instructions provide you with information on caring for yourself after your procedure. Your caregiver may also give you more specific instructions. Your treatment has been planned according to current medical practices, but problems sometimes occur. Call your caregiver if you have any problems or questions after your procedure. HOME CARE INSTRUCTIONS  You may shower 24 hours after the procedure. Remove the bandage (dressing) and gently wash the site with plain soap and water. Gently pat the site dry.   Do not apply powder or lotion to the site.   Do not sit in a bathtub, swimming pool, or whirlpool for 5 to 7 days.   No bending, squatting, or lifting anything over 10  pounds (4.5 kg) as directed by your caregiver.   Inspect the site at least twice daily.   Do not drive home if you are discharged the same day of the procedure. Have someone else drive you.  What to expect:  Any bruising will usually fade within 1 to 2 weeks.   Blood that collects in the tissue (hematoma) may be painful to the touch. It should usually decrease in size and tenderness within 1 to 2 weeks.  SEEK IMMEDIATE MEDICAL CARE  IF:  You have unusual pain at the groin site or down the affected leg.   You have redness, warmth, swelling, or pain at the groin site.   You have drainage (other than a small amount of blood on the dressing).   You have chills.   You have a fever or persistent symptoms for more than 72 hours.   You have a fever and your symptoms suddenly get worse.   Your leg becomes pale, cool, tingly, or numb.  You have heavy bleeding from the site. Hold pressure on the site.      10 Habits of Highly Healthy [facility] wants to help you get well and stay well.  Live a longer, healthier life by practicing healthy habits every day.  1.  Visit your primary care provider regularly. 2.  Make time for family and friends.  Healthy relationships are important. 3.  Take medications as directed by your provider. 4.  Maintain a healthy weight and a trim waistline. 5.  Eat healthy meals and snacks, rich in fruits, vegetables, whole grains, and lean proteins. 6.  Get moving every day - aim for 150 minutes of moderate physical activity each week. 7.  Don't smoke. 8.  Avoid alcohol or drink in moderation. 9.  Manage stress through meditation or mindful relaxation. 10.  Get seven to nine hours of quality sleep each night.  Want more information on healthy habits?  To learn more about these and other healthy habits, visit [email or web address].   ----------------------------- Reduced EF  One of your heart tests showed weakness of the heart muscle this admission. This may make you more susceptible to weight gain from fluid retention, which can lead to symptoms that we call heart failure. Please follow these special instructions:  1. Follow a low-salt diet - you are allowed no more than 2,000mg  of sodium per day. Watch your fluid intake. In general, you should not be taking in more than 2 liters of fluid per day (no more than 8 glasses per day). This includes sources of water in foods like soup,  coffee, tea, milk, etc. 2. Weigh yourself on the same scale at same time of day and keep a log. 3. Call your doctor: (Anytime you feel any of the following symptoms)  - 3lb weight gain overnight or 5lb within a few days - Shortness of breath, with or without a dry hacking cough  - Swelling in the hands, feet or stomach  - If you have to sleep on extra pillows at night in order to breathe   IT IS IMPORTANT TO LET YOUR DOCTOR KNOW EARLY ON IF YOU ARE HAVING SYMPTOMS SO WE CAN HELP YOU!

## 2019-01-22 NOTE — Plan of Care (Signed)
  Problem: Health Behavior/Discharge Planning: Goal: Ability to manage health-related needs will improve Outcome: Progressing   Problem: Clinical Measurements: Goal: Ability to maintain clinical measurements within normal limits will improve Outcome: Progressing Goal: Cardiovascular complication will be avoided Outcome: Progressing   Problem: Coping: Goal: Level of anxiety will decrease Outcome: Progressing   Problem: Pain Managment: Goal: General experience of comfort will improve Outcome: Progressing   Problem: Cardiac: Goal: Ability to achieve and maintain adequate cardiovascular perfusion will improve Outcome: Progressing   Problem: Cardiovascular: Goal: Ability to achieve and maintain adequate cardiovascular perfusion will improve Outcome: Progressing Goal: Vascular access site(s) Level 0-1 will be maintained Outcome: Progressing

## 2019-01-22 NOTE — Plan of Care (Signed)
Discharge education provided.  Understanding verified through teach back.

## 2019-01-22 NOTE — Telephone Encounter (Signed)
-----   Message from Nelva Bush, MD sent at 01/21/2019  8:01 PM EST ----- Regarding: [facility] at [facility],  Mr. Warne will need staged PCI to the mid LAD using orbital atherectomy with me at [facility].  Can you try to set him up for that procedure on 2/24 or 3/2?  He will need repeat labs shortly before the procedure.  Let me know if any questions come up.  Thanks.  Gerald Stabs

## 2019-01-22 NOTE — Progress Notes (Signed)
Cardiovascular and Pulmonary Nurse Navigator Note:    73 year old male with hx of HTN, Barrett's esophagus, gout, BPH, and obesity who presented for scheduled for outpatient cath after being worked up for chest pain and recent abnormal cardiac CT.    Procedures  01/21/2019 CORONARY STENT INTERVENTION  LEFT HEART CATH AND CORONARY ANGIOGRAPHY  Conclusion   Conclusions: 1. Significant 2-vessel coronary artery disease, including 80-90% mid LAD stenosis with heavy calcification, as well as 70% proximal LCx stenosis. 2. Normal left ventricular systolic function. 3. Moderately elevated left ventricular filling pressure. 4. Successful PCI to proximal LCx using Resolute Onyx 2.75 x 15 mm drug-eluting stent (postdilated to 3.1 mm proximally) with 0% residual stenosis and TIMI-3 flow.  Recommendations: 1. Given significant mid LAD disease with heavy calcification, we will plan for staged PCI using orbital atherectomy in ~2 weeks. 2. Dual antiplatelet therapy with aspirin and clopidogrel for at least 6 months. 3. Aggressive secondary prevention. 4. If renal function allows, consider beginning gentle diuresis tomorrow for an element of HFpEF. 5. Overnight extended recovery.  Nelva Bush, MD [facility] Pager: [phone]   Education:   Rounded on patient.  Patient's wife at bedside.  Provided patient and wife with booklet entitled "Angioplasty and Stents."    Discussed the definition of CAD. Reviewed the location of CAD and where his stent was placed. Discussed the stenosis of the LAD and procedure planned at [facility].  Informed patient he will be given a stent card. Explained the purpose of the stent card. Instructed patient to keep stent card in his wallet.  ? Discussed modifiable risk factors including controlling blood pressure, cholesterol, and blood sugar; following heart healthy diet; maintaining healthy weight; exercise; and smoking cessation.  ? Discussed cardiac medications  including rationale for taking, mechanisms of action, and side effects. Stressed the importance of taking medications as prescribed. Assigned RN to review medications, new medications, and when next dose is due.   Heart healthy diet of low sodium, low fat, low cholesterol heart healthy diet discussed. Information / handouts on diet provided. ADA Heart Healthy Nutrition Therapy, Reading Food Labels for Heart Healthy Diet, selecting Heart Healthy Carbohydrates given to patient.   ? Smoking Cessation - Patient is a NEVER smoker.  ? Exercise - Benefits of exercised discussed.  Patient has been a life long exerciser and continues to exercise.  Patient cycles, canoes, and stays active.    Informed patient that his cardiologist has referred him to outpatient Cardiac Rehab. An overview of the program was provided. Brochure, informational letter, class and orientation times, and CPT billing codes given to patient. Patient is interested in participating. Patient plans to check with his insurance company to see what his out-of-pocket expenses will be. Patient needs arthrectomy of his LAD, which will be performed at [facility] in one to two weeks.  Will defer contacting patient for two to three weeks.    Patient appreciative of the information.  ? Roanna Epley, RN, BSN, [place]  [facility] Cardiac & Pulmonary Rehab  Cardiovascular & Pulmonary Nurse Navigator  Direct Line: [phone]  Department Phone #: [phone] Fax: [phone]  Email Address: [email or web address]

## 2019-01-22 NOTE — H&P (View-Only) (Signed)
Progress Note  Patient Name: Jesse Norton Date of Encounter: 01/22/2019  Primary Cardiologist: Nelva Bush, MD   Subjective   No significant chest pain overnight Swelling right wrist, mild tenderness Ambulating around the hallway without shortness of breath or chest pain Denies significant leg swelling abdominal bloating PND orthopnea  Inpatient Medications    Scheduled Meds: . amLODipine  10 mg Oral Daily  . aspirin EC  81 mg Oral Daily  . atorvastatin  40 mg Oral Daily  . clopidogrel  75 mg Oral Q breakfast  . enoxaparin (LOVENOX) injection  40 mg Subcutaneous Q24H  . febuxostat  40 mg Oral Daily  . FLUoxetine  20 mg Oral Daily  . losartan  100 mg Oral Daily  . metoprolol tartrate  25 mg Oral BID  . pantoprazole  20 mg Oral BID  . sodium chloride flush  3 mL Intravenous Q12H   Continuous Infusions: . sodium chloride     PRN Meds: sodium chloride, acetaminophen, LORazepam, ondansetron (ZOFRAN) IV, sodium chloride flush   Vital Signs    Vitals:   01/21/19 1528 01/21/19 2012 01/22/19 0437 01/22/19 0726  BP: (!) 144/83 (!) 144/84 136/84 137/78  Pulse: 65 68 65 63  Resp: 18 18 20    Temp: 97.6 F (36.4 C) 97.9 F (36.6 C) 97.7 F (36.5 C) 97.8 F (36.6 C)  TempSrc: Oral Oral Oral Oral  SpO2: 97% 96% 97% 98%  Weight: 109.2 kg     Height: 5\' 11"  (1.803 m)       Intake/Output Summary (Last 24 hours) at 01/22/2019 1143 Last data filed at 01/22/2019 0904 Gross per 24 hour  Intake 720 ml  Output 0 ml  Net 720 ml   Last 3 Weights 01/21/2019 01/21/2019 01/15/2019  Weight (lbs) 240 lb 11.2 oz 260 lb 2.3 oz 260 lb  Weight (kg) 109.181 kg 118 kg 117.935 kg      Telemetry    Normal sinus rhythm- Personally Reviewed  ECG     - Personally Reviewed  Physical Exam   Constitutional:  oriented to person, place, and time. No distress.  HENT:  Head: Grossly normal Eyes:  no discharge. No scleral icterus.  Neck: No JVD, no carotid bruits  Cardiovascular:  Regular rate and rhythm, no murmurs appreciated Pulmonary/Chest: Clear to auscultation bilaterally, no wheezes or rails Abdominal: Soft.  no distension.  no tenderness.  Musculoskeletal: Normal range of motion Small region of ecchymosis right wrist bandage in place Neurological:  normal muscle tone. Coordination normal. No atrophy Skin: Skin warm and dry Psychiatric: normal affect, pleasant   Labs    Chemistry Recent Labs  Lab 01/21/19 1235 01/22/19 0455  NA  --  138  K  --  3.4*  CL  --  105  CO2  --  30  GLUCOSE  --  118*  BUN  --  18  CREATININE 0.85 0.84  CALCIUM  --  8.8*  GFRNONAA >60 >60  GFRAA >60 >60  ANIONGAP  --  3*     Hematology Recent Labs  Lab 01/21/19 1235 01/22/19 0455  WBC 6.8 7.8  RBC 4.06* 3.90*  HGB 13.6 13.0  HCT 40.1 38.4*  MCV 98.8 98.5  MCH 33.5 33.3  MCHC 33.9 33.9  RDW 13.0 12.9  PLT 244 234    Cardiac EnzymesNo results for input(s): TROPONINI in the last 168 hours. No results for input(s): TROPIPOC in the last 168 hours.   BNPNo results for input(s): BNP, PROBNP in  the last 168 hours.   DDimer No results for input(s): DDIMER in the last 168 hours.   Radiology    No results found.  Cardiac Studies   Cath2/10/2019 1. Significant 2-vessel coronary artery disease, including 80-90% mid LAD stenosis with heavy calcification, as well as 70% proximal LCx stenosis. 2. Normal left ventricular systolic function. 3. Moderately elevated left ventricular filling pressure. 4. Successful PCI to proximal LCx using Resolute Onyx 2.75 x 15 mm drug-eluting stent (postdilated to 3.1 mm proximally) with 0% residual stenosis and TIMI-3 flow.  Recommendations: 1. Given significant mid LAD disease with heavy calcification, we will plan for staged PCI using orbital atherectomy in ~2 weeks. 2. Dual antiplatelet therapy with aspirin and clopidogrel for at least 6 months. 3. Aggressive secondary prevention. 4. If renal function allows, consider  beginning gentle diuresis tomorrow for an element of HFpEF. 5. Overnight extended recovery.   Patient Profile     73 year old male with a history of hypertension, Barrett's esophagus, gout, BPH, and obesity,  chest pain and recent abnormal cardiac CT. Cardiac catheterization yesterday showing severe disease of left circumflex and LAD Had stent placed to left circumflex plan for staged procedure to the LAD in 2 weeks time  Assessment & Plan    Coronary artery disease with stable angina Stent placed yesterday to left circumflex Plan is for PCI to the LAD in [facility] with orbital atherectomy -Stressed importance of staying on his low-dose aspirin with Plavix Statin recently started January 2020 He is also on beta-blocker/metoprolol  Hypertension On amlodipine , losartan, Toprol    Total encounter time more than 25 minutes  Greater than 50% was spent in counseling and coordination of care with the patient  For questions or updates, please contact [facility] Please consult [email or web address] for contact info under        Signed, Ida Rogue, MD  01/22/2019, 11:43 AM

## 2019-01-24 ENCOUNTER — Encounter: Payer: Self-pay | Admitting: *Deleted

## 2019-01-24 NOTE — Telephone Encounter (Signed)
No answer. Left message to call back.   

## 2019-01-24 NOTE — Telephone Encounter (Signed)
Patient returning call.

## 2019-01-24 NOTE — Telephone Encounter (Signed)
Patient calling back. He is agreeable to procedure on 02/03/19.  He would like the letter with pre-procedural instructions mailed to him.  We went over them over the phone as well and he verbalized understanding. He will come to our office on 01/28/19 for lab work.    Called [facility] Cath lab. Patient scheduled for 02/03/19 at 0900 am with arrival time of 0700 am.  Patient called back and verbalized understanding of this arrival time and date. He was very Patent attorney. Letter mailed.

## 2019-01-24 NOTE — Telephone Encounter (Signed)
Attempted to reach patient. It went straight to voicemail.  Please see if I'm available and I will try to speak with him when he calls back if I am available.

## 2019-01-28 ENCOUNTER — Other Ambulatory Visit (INDEPENDENT_AMBULATORY_CARE_PROVIDER_SITE_OTHER): Payer: Medicare Other

## 2019-01-28 DIAGNOSIS — Z01810 Encounter for preprocedural cardiovascular examination: Secondary | ICD-10-CM | POA: Diagnosis not present

## 2019-01-28 DIAGNOSIS — I25119 Atherosclerotic heart disease of native coronary artery with unspecified angina pectoris: Secondary | ICD-10-CM | POA: Diagnosis not present

## 2019-01-29 LAB — BASIC METABOLIC PANEL
BUN/Creatinine Ratio: 16 (ref 10–24)
BUN: 18 mg/dL (ref 8–27)
CHLORIDE: 100 mmol/L (ref 96–106)
CO2: 24 mmol/L (ref 20–29)
Calcium: 9.6 mg/dL (ref 8.6–10.2)
Creatinine, Ser: 1.13 mg/dL (ref 0.76–1.27)
GFR, EST AFRICAN AMERICAN: 74 mL/min/{1.73_m2} (ref >59)
GFR, EST NON AFRICAN AMERICAN: 64 mL/min/{1.73_m2} (ref >59)
Glucose: 108 mg/dL — ABNORMAL HIGH (ref 65–99)
POTASSIUM: 4.5 mmol/L (ref 3.5–5.2)
Sodium: 139 mmol/L (ref 134–144)

## 2019-01-29 LAB — CBC WITH DIFFERENTIAL/PLATELET
Basophils Absolute: 0.1 10*3/uL (ref 0.0–0.2)
Basos: 1 %
EOS (ABSOLUTE): 0.1 10*3/uL (ref 0.0–0.4)
Eos: 2 %
Hematocrit: 40.4 % (ref 37.5–51.0)
Hemoglobin: 14.5 g/dL (ref 13.0–17.7)
Immature Grans (Abs): 0 10*3/uL (ref 0.0–0.1)
Immature Granulocytes: 0 %
Lymphocytes Absolute: 1.8 10*3/uL (ref 0.7–3.1)
Lymphs: 29 %
MCH: 35 pg — ABNORMAL HIGH (ref 26.6–33.0)
MCHC: 35.9 g/dL — ABNORMAL HIGH (ref 31.5–35.7)
MCV: 98 fL — ABNORMAL HIGH (ref 79–97)
Monocytes Absolute: 0.8 10*3/uL (ref 0.1–0.9)
Monocytes: 13 %
Neutrophils Absolute: 3.4 10*3/uL (ref 1.4–7.0)
Neutrophils: 55 %
PLATELETS: 297 10*3/uL (ref 150–450)
RBC: 4.14 x10E6/uL (ref 4.14–5.80)
RDW: 12.6 % (ref 11.6–15.4)
WBC: 6.1 10*3/uL (ref 3.4–10.8)

## 2019-01-30 ENCOUNTER — Telehealth: Payer: Self-pay | Admitting: *Deleted

## 2019-01-30 NOTE — Telephone Encounter (Signed)
Pt contacted pre-coronary atherectomy scheduled at [facility] for: Monday February 03, 2019 9 AM Verified arrival time and place: [facility] Entrance A at: 7 AM  No solid food after midnight prior to cath, clear liquids until 5 AM day of procedure. Contrast allergy: no Verified no diabetes medications.  Hold: Furosemide-02/01/19, 02/02/19, 02/03/19 KCl-02/01/19, 02/02/19, 02/03/19  Except hold medications AM meds can be  taken pre-cath with sip of water including: ASA 81 mg Clopidogrel 75 mg  Confirmed patient has responsible person to drive home post procedure and observe 24 hours after arriving home: yes

## 2019-02-03 ENCOUNTER — Ambulatory Visit (HOSPITAL_COMMUNITY)
Admission: RE | Admit: 2019-02-03 | Discharge: 2019-02-04 | Disposition: A | Discharge status: Home / Self Care | Payer: Medicare Other | Attending: Internal Medicine | Admitting: Internal Medicine

## 2019-02-03 ENCOUNTER — Telehealth: Payer: Self-pay

## 2019-02-03 ENCOUNTER — Encounter (HOSPITAL_COMMUNITY)
Admission: RE | Disposition: A | Discharge status: Home / Self Care | Payer: Self-pay | Source: Home / Self Care | Attending: Internal Medicine

## 2019-02-03 ENCOUNTER — Other Ambulatory Visit: Payer: Self-pay

## 2019-02-03 ENCOUNTER — Encounter (HOSPITAL_COMMUNITY): Payer: Self-pay | Admitting: Internal Medicine

## 2019-02-03 DIAGNOSIS — Z6834 Body mass index (BMI) 34.0-34.9, adult: Secondary | ICD-10-CM | POA: Insufficient documentation

## 2019-02-03 DIAGNOSIS — I25118 Atherosclerotic heart disease of native coronary artery with other forms of angina pectoris: Secondary | ICD-10-CM | POA: Diagnosis not present

## 2019-02-03 DIAGNOSIS — M109 Gout, unspecified: Secondary | ICD-10-CM | POA: Insufficient documentation

## 2019-02-03 DIAGNOSIS — Z7902 Long term (current) use of antithrombotics/antiplatelets: Secondary | ICD-10-CM | POA: Diagnosis not present

## 2019-02-03 DIAGNOSIS — I10 Essential (primary) hypertension: Secondary | ICD-10-CM | POA: Diagnosis not present

## 2019-02-03 DIAGNOSIS — I2584 Coronary atherosclerosis due to calcified coronary lesion: Secondary | ICD-10-CM | POA: Insufficient documentation

## 2019-02-03 DIAGNOSIS — R931 Abnormal findings on diagnostic imaging of heart and coronary circulation: Secondary | ICD-10-CM | POA: Diagnosis present

## 2019-02-03 DIAGNOSIS — K2270 Barrett's esophagus without dysplasia: Secondary | ICD-10-CM | POA: Insufficient documentation

## 2019-02-03 DIAGNOSIS — I2510 Atherosclerotic heart disease of native coronary artery without angina pectoris: Secondary | ICD-10-CM | POA: Diagnosis present

## 2019-02-03 DIAGNOSIS — Z7982 Long term (current) use of aspirin: Secondary | ICD-10-CM | POA: Insufficient documentation

## 2019-02-03 DIAGNOSIS — I208 Other forms of angina pectoris: Secondary | ICD-10-CM | POA: Diagnosis present

## 2019-02-03 DIAGNOSIS — E669 Obesity, unspecified: Secondary | ICD-10-CM | POA: Diagnosis not present

## 2019-02-03 DIAGNOSIS — N400 Enlarged prostate without lower urinary tract symptoms: Secondary | ICD-10-CM | POA: Insufficient documentation

## 2019-02-03 DIAGNOSIS — Z955 Presence of coronary angioplasty implant and graft: Secondary | ICD-10-CM | POA: Diagnosis not present

## 2019-02-03 DIAGNOSIS — Z79899 Other long term (current) drug therapy: Secondary | ICD-10-CM | POA: Insufficient documentation

## 2019-02-03 HISTORY — PX: CORONARY STENT INTERVENTION: CATH118234

## 2019-02-03 HISTORY — PX: CORONARY ATHERECTOMY: CATH118238

## 2019-02-03 HISTORY — PX: CORONARY ULTRASOUND/IVUS: CATH118244

## 2019-02-03 LAB — POCT ACTIVATED CLOTTING TIME
ACTIVATED CLOTTING TIME: 268 s
Activated Clotting Time: 274 seconds
Activated Clotting Time: 285 seconds
Activated Clotting Time: 274 seconds

## 2019-02-03 LAB — CBC
HCT: 37.7 % — ABNORMAL LOW (ref 39.0–52.0)
Hemoglobin: 12.6 g/dL — ABNORMAL LOW (ref 13.0–17.0)
MCH: 33 pg (ref 26.0–34.0)
MCHC: 33.4 g/dL (ref 30.0–36.0)
MCV: 98.7 fL (ref 80.0–100.0)
Platelets: 244 10*3/uL (ref 150–400)
RBC: 3.82 MIL/uL — ABNORMAL LOW (ref 4.22–5.81)
RDW: 12.7 % (ref 11.5–15.5)
WBC: 5.7 10*3/uL (ref 4.0–10.5)
nRBC: 0 % (ref 0.0–0.2)

## 2019-02-03 LAB — CREATININE, SERUM
Creatinine, Ser: 0.9 mg/dL (ref 0.61–1.24)
GFR calc Af Amer: >60 mL/min (ref >60)
GFR calc non Af Amer: >60 mL/min (ref >60)

## 2019-02-03 SURGERY — CORONARY ATHERECTOMY
Anesthesia: LOCAL

## 2019-02-03 MED ORDER — ANGIOPLASTY BOOK
Freq: Once | Status: AC
  Administered 2019-02-03: 21:00:00
  Filled 2019-02-03: qty 1

## 2019-02-03 MED ORDER — SODIUM CHLORIDE 0.9 % WEIGHT BASED INFUSION
3.0000 mL/kg/h | INTRAVENOUS | Status: DC
  Administered 2019-02-03: 3 mL/kg/h via INTRAVENOUS

## 2019-02-03 MED ORDER — VERAPAMIL HCL 2.5 MG/ML IV SOLN
INTRAVENOUS | Status: AC
  Filled 2019-02-03: qty 2

## 2019-02-03 MED ORDER — HEPARIN (PORCINE) IN NACL 1000-0.9 UT/500ML-% IV SOLN
INTRAVENOUS | Status: DC | PRN
  Administered 2019-02-03 (×2): 500 mL

## 2019-02-03 MED ORDER — ATORVASTATIN CALCIUM 40 MG PO TABS
40.0000 mg | ORAL_TABLET | Freq: Every day | ORAL | Status: DC
  Administered 2019-02-04: 11:00:00 40 mg via ORAL
  Filled 2019-02-03: qty 1

## 2019-02-03 MED ORDER — CLOPIDOGREL BISULFATE 300 MG PO TABS
ORAL_TABLET | ORAL | Status: DC | PRN
  Administered 2019-02-03: 300 mg via ORAL

## 2019-02-03 MED ORDER — SODIUM CHLORIDE 0.9 % IV SOLN: 250.0000 mL | INTRAVENOUS | Status: DC | PRN

## 2019-02-03 MED ORDER — FENTANYL CITRATE (PF) 100 MCG/2ML IJ SOLN
INTRAMUSCULAR | Status: DC | PRN
  Administered 2019-02-03 (×2): 25 ug via INTRAVENOUS

## 2019-02-03 MED ORDER — LOSARTAN POTASSIUM 50 MG PO TABS
100.0000 mg | ORAL_TABLET | Freq: Every day | ORAL | Status: DC
  Administered 2019-02-04: 100 mg via ORAL
  Filled 2019-02-03: qty 2

## 2019-02-03 MED ORDER — MIDAZOLAM HCL 2 MG/2ML IJ SOLN
INTRAMUSCULAR | Status: AC
  Filled 2019-02-03: qty 2

## 2019-02-03 MED ORDER — SODIUM CHLORIDE 0.9% FLUSH: 3.0000 mL | Freq: Two times a day (BID) | INTRAVENOUS | Status: DC

## 2019-02-03 MED ORDER — ONDANSETRON HCL 4 MG/2ML IJ SOLN: 4.0000 mg | Freq: Four times a day (QID) | INTRAMUSCULAR | Status: DC | PRN

## 2019-02-03 MED ORDER — LIDOCAINE HCL (PF) 1 % IJ SOLN
INTRAMUSCULAR | Status: DC | PRN
  Administered 2019-02-03: 2 mL

## 2019-02-03 MED ORDER — HEPARIN SODIUM (PORCINE) 1000 UNIT/ML IJ SOLN
INTRAMUSCULAR | Status: AC
  Filled 2019-02-03: qty 1

## 2019-02-03 MED ORDER — SODIUM CHLORIDE 0.9 % IV SOLN
INTRAVENOUS | Status: AC | PRN
  Administered 2019-02-03: 109 mL/h via INTRAVENOUS

## 2019-02-03 MED ORDER — MIDAZOLAM HCL 2 MG/2ML IJ SOLN
INTRAMUSCULAR | Status: DC | PRN
  Administered 2019-02-03 (×3): 1 mg via INTRAVENOUS

## 2019-02-03 MED ORDER — ENOXAPARIN SODIUM 40 MG/0.4ML ~~LOC~~ SOLN
40.0000 mg | SUBCUTANEOUS | Status: DC
  Administered 2019-02-04: 09:00:00 40 mg via SUBCUTANEOUS
  Filled 2019-02-03: qty 0.4

## 2019-02-03 MED ORDER — SODIUM CHLORIDE 0.9 % IV SOLN
INTRAVENOUS | Status: AC
  Administered 2019-02-03: 13:00:00 via INTRAVENOUS

## 2019-02-03 MED ORDER — SODIUM CHLORIDE 0.9 % WEIGHT BASED INFUSION
1.0000 mL/kg/h | INTRAVENOUS | Status: DC
  Administered 2019-02-03: 250 mL via INTRAVENOUS

## 2019-02-03 MED ORDER — NITROGLYCERIN 1 MG/10 ML FOR IR/CATH LAB
INTRA_ARTERIAL | Status: DC | PRN
  Administered 2019-02-03: 100 ug via INTRACORONARY

## 2019-02-03 MED ORDER — FLUOXETINE HCL 20 MG PO CAPS
20.0000 mg | ORAL_CAPSULE | Freq: Every day | ORAL | Status: DC
  Administered 2019-02-04: 20 mg via ORAL
  Filled 2019-02-03: qty 1

## 2019-02-03 MED ORDER — LIDOCAINE HCL (PF) 1 % IJ SOLN
INTRAMUSCULAR | Status: AC
  Filled 2019-02-03: qty 30

## 2019-02-03 MED ORDER — ASPIRIN EC 81 MG PO TBEC
81.0000 mg | DELAYED_RELEASE_TABLET | Freq: Every day | ORAL | Status: DC
  Administered 2019-02-04: 11:00:00 81 mg via ORAL
  Filled 2019-02-03: qty 1

## 2019-02-03 MED ORDER — LABETALOL HCL 5 MG/ML IV SOLN: 10.0000 mg | INTRAVENOUS | Status: AC | PRN

## 2019-02-03 MED ORDER — CLOPIDOGREL BISULFATE 75 MG PO TABS: 75.0000 mg | ORAL_TABLET | ORAL | Status: DC

## 2019-02-03 MED ORDER — IOHEXOL 350 MG/ML SOLN
INTRAVENOUS | Status: DC | PRN
  Administered 2019-02-03: 115 mL via INTRACARDIAC

## 2019-02-03 MED ORDER — NITROGLYCERIN 1 MG/10 ML FOR IR/CATH LAB
INTRA_ARTERIAL | Status: AC
  Filled 2019-02-03: qty 10

## 2019-02-03 MED ORDER — HEPARIN (PORCINE) IN NACL 1000-0.9 UT/500ML-% IV SOLN
INTRAVENOUS | Status: AC
  Filled 2019-02-03: qty 500

## 2019-02-03 MED ORDER — HEPARIN SODIUM (PORCINE) 1000 UNIT/ML IJ SOLN
INTRAMUSCULAR | Status: DC | PRN
  Administered 2019-02-03: 3000 [IU] via INTRAVENOUS
  Administered 2019-02-03: 2000 [IU] via INTRAVENOUS
  Administered 2019-02-03 (×2): 5000 [IU] via INTRAVENOUS
  Administered 2019-02-03: 2000 [IU] via INTRAVENOUS

## 2019-02-03 MED ORDER — VIPERSLIDE LUBRICANT OPTIME
TOPICAL | Status: DC | PRN
  Administered 2019-02-03: 13:00:00 via SURGICAL_CAVITY

## 2019-02-03 MED ORDER — METOPROLOL TARTRATE 25 MG PO TABS
25.0000 mg | ORAL_TABLET | Freq: Two times a day (BID) | ORAL | Status: DC
  Administered 2019-02-03 – 2019-02-04 (×2): 25 mg via ORAL
  Filled 2019-02-03 (×2): qty 1

## 2019-02-03 MED ORDER — LORAZEPAM 0.5 MG PO TABS: 0.5000 mg | ORAL_TABLET | Freq: Every day | ORAL | Status: DC | PRN

## 2019-02-03 MED ORDER — CLOPIDOGREL BISULFATE 300 MG PO TABS
ORAL_TABLET | ORAL | Status: AC
  Filled 2019-02-03: qty 1

## 2019-02-03 MED ORDER — SODIUM CHLORIDE 0.9% FLUSH: 3.0000 mL | INTRAVENOUS | Status: DC | PRN

## 2019-02-03 MED ORDER — FENTANYL CITRATE (PF) 100 MCG/2ML IJ SOLN
INTRAMUSCULAR | Status: AC
  Filled 2019-02-03: qty 2

## 2019-02-03 MED ORDER — ACETAMINOPHEN 325 MG PO TABS: 650.0000 mg | ORAL_TABLET | ORAL | Status: DC | PRN

## 2019-02-03 MED ORDER — VERAPAMIL HCL 2.5 MG/ML IV SOLN
INTRAVENOUS | Status: DC | PRN
  Administered 2019-02-03: 10 mL via INTRA_ARTERIAL

## 2019-02-03 MED ORDER — ASPIRIN 81 MG PO CHEW: 81.0000 mg | CHEWABLE_TABLET | ORAL | Status: DC

## 2019-02-03 MED ORDER — HYDRALAZINE HCL 20 MG/ML IJ SOLN: 5.0000 mg | INTRAMUSCULAR | Status: AC | PRN

## 2019-02-03 MED ORDER — CLOPIDOGREL BISULFATE 75 MG PO TABS
75.0000 mg | ORAL_TABLET | Freq: Every day | ORAL | Status: DC
  Administered 2019-02-04: 75 mg via ORAL
  Filled 2019-02-03: qty 1

## 2019-02-03 MED ORDER — PANTOPRAZOLE SODIUM 20 MG PO TBEC
20.0000 mg | DELAYED_RELEASE_TABLET | Freq: Two times a day (BID) | ORAL | Status: DC
  Administered 2019-02-03 – 2019-02-04 (×2): 20 mg via ORAL
  Filled 2019-02-03 (×2): qty 1

## 2019-02-03 SURGICAL SUPPLY — 31 items
BALLN MINITREK OTW 1.5X6 (BALLOONS) ×2
BALLN SAPPHIRE 2.5X12 (BALLOONS) ×2
BALLN SAPPHIRE ~~LOC~~ 3.25X12 (BALLOONS) ×1 IMPLANT
BALLN SAPPHIRE ~~LOC~~ 3.5X12 (BALLOONS) ×1 IMPLANT
BALLOON MINITREK OTW 1.5X6 (BALLOONS) IMPLANT
BALLOON SAPPHIRE 2.5X12 (BALLOONS) IMPLANT
CATH INFINITI JR4 5F (CATHETERS) ×1 IMPLANT
CATH LAUNCHER 6FR EBU 4 (CATHETERS) ×1 IMPLANT
CATH OPTICROSS 40MHZ (CATHETERS) ×1 IMPLANT
CATH OPTITORQUE TIG 4.0 5F (CATHETERS) IMPLANT
CROWN DIAMONDBACK CLASSIC 1.25 (BURR) ×1 IMPLANT
DEVICE RAD COMP TR BAND LRG (VASCULAR PRODUCTS) ×1 IMPLANT
ELECT DEFIB PAD ADLT CADENCE (PAD) ×1 IMPLANT
GLIDESHEATH SLEND SS 6F .021 (SHEATH) ×1 IMPLANT
GUIDEWIRE INQWIRE 1.5J.035X260 (WIRE) IMPLANT
INQWIRE 1.5J .035X260CM (WIRE) ×2
KIT ENCORE 26 ADVANTAGE (KITS) ×1 IMPLANT
KIT HEART LEFT (KITS) ×2 IMPLANT
KIT HEMO VALVE WATCHDOG (MISCELLANEOUS) ×1 IMPLANT
LUBRICANT VIPERSLIDE CORONARY (MISCELLANEOUS) ×1 IMPLANT
PACK CARDIAC CATHETERIZATION (CUSTOM PROCEDURE TRAY) ×2 IMPLANT
PAD PRO RADIOLUCENT 2001M-C (PAD) ×1 IMPLANT
SHEATH PROBE COVER 6X72 (BAG) ×1 IMPLANT
SLED PULL BACK IVUS (MISCELLANEOUS) ×1 IMPLANT
STENT RESOLUTE ONYX 3.0X15 (Permanent Stent) ×1 IMPLANT
TRANSDUCER W/STOPCOCK (MISCELLANEOUS) ×2 IMPLANT
TUBING CIL FLEX 10 FLL-RA (TUBING) ×2 IMPLANT
WIRE ASAHI GRAND SLAM 180CM (WIRE) ×1 IMPLANT
WIRE RUNTHROUGH .014X180CM (WIRE) ×1 IMPLANT
WIRE RUNTHROUGH EXTENSION (WIRE) ×1 IMPLANT
WIRE VIPERWIRE COR FLEX .012 (WIRE) ×1 IMPLANT

## 2019-02-03 NOTE — Brief Op Note (Signed)
BRIEF CARDIAC CATHETERIZATION NOTE  DATE: 02/03/2019  TIME: 12:27 PM  PATIENT:  Jesse Norton  73 y.o. male  PRE-OPERATIVE DIAGNOSIS:  Stable angina  POST-OPERATIVE DIAGNOSIS:  Same  PROCEDURE:  Procedure(s): CORONARY ATHERECTOMY (N/A) CORONARY STENT INTERVENTION (N/A) Intravascular Ultrasound/IVUS (N/A)  SURGEON:  Surgeon(s) and Role:    Nelva Bush, MD - Primary  FINDINGS: 1. Severe, heavily calcified mid LAD stenosis (80-90%). 2. Widely patent LCx stent. 3. Normal LVEDP. 4. Successful orbital atherectomy and IVUS-guided PCI to the mid LAD using Resolute Onyx 3.0 x 15 mm drug-eluting stent (postdilated to 3.6 mm) with 0% residual stenosis and TIMI-3 flow.  RECOMMENDATIONS: 1. Overnight extended recovery. 2. Continue dual antiplatelet therapy with aspirin and clopidogrel for at least 6 months. 3. Aggressive secondary prevention.  Nelva Bush, MD [facility] Pager: [phone]

## 2019-02-03 NOTE — Interval H&P Note (Signed)
History and Physical Interval Note:  02/03/2019 9:34 AM  Jesse Norton  has presented today for surgery, with the diagnosis of stable angina and abnormal cardiac CTA. The various methods of treatment have been discussed with the patient and family. After consideration of risks, benefits and other options for treatment, the patient has consented to  Procedure(s): CORONARY ATHERECTOMY (N/A) as a surgical intervention .  The patient's history has been reviewed, patient examined, no change in status, stable for surgery.  I have reviewed the patient's chart and labs.  Questions were answered to the patient's satisfaction.    Cath Lab Visit (complete for each Cath Lab visit)  Clinical Evaluation Leading to the Procedure:   ACS: No.  Non-ACS:    Anginal Classification: CCS II  Anti-ischemic medical therapy: Maximal Therapy (2 or more classes of medications)  Non-Invasive Test Results: Intermediate-risk stress test findings: cardiac mortality 1-3%/year  Prior CABG: No previous CABG  Jesse Norton

## 2019-02-03 NOTE — Telephone Encounter (Signed)
L MOM to confirm appt change to 3/6 at 2 pm with Murray Hodgkins, NP

## 2019-02-03 NOTE — Telephone Encounter (Signed)
-----   Message from Vanessa Ralphs, RN sent at 02/03/2019 12:41 PM EST ----- Regarding: FW: Post-PCI follow-up Cina Klumpp,  Do you mind calling patient to push out appointment. Please let me know if you need help finding a slot.  Thanks, Anderson Malta ----- Message ----- From: Nelva Bush, MD Sent: 02/03/2019  12:29 PM EST To: Vanessa Ralphs, RN Subject: Post-PCI follow-up                             [facility],  Mr. Reinders' PCI went well today.  He is going to spend the night and hopefully be discharged tomorrow.  He is currently scheduled to [facility] on 2/27.  Can we move that back another week?  No need for him to be seen in the office quite so soon.  Thanks.  Gerald Stabs

## 2019-02-04 ENCOUNTER — Encounter (HOSPITAL_COMMUNITY): Payer: Self-pay | Admitting: General Practice

## 2019-02-04 ENCOUNTER — Other Ambulatory Visit: Payer: Self-pay

## 2019-02-04 DIAGNOSIS — Z955 Presence of coronary angioplasty implant and graft: Secondary | ICD-10-CM | POA: Diagnosis not present

## 2019-02-04 DIAGNOSIS — I2584 Coronary atherosclerosis due to calcified coronary lesion: Secondary | ICD-10-CM | POA: Diagnosis not present

## 2019-02-04 DIAGNOSIS — M109 Gout, unspecified: Secondary | ICD-10-CM | POA: Diagnosis not present

## 2019-02-04 DIAGNOSIS — K2270 Barrett's esophagus without dysplasia: Secondary | ICD-10-CM | POA: Diagnosis not present

## 2019-02-04 DIAGNOSIS — N400 Enlarged prostate without lower urinary tract symptoms: Secondary | ICD-10-CM | POA: Diagnosis not present

## 2019-02-04 DIAGNOSIS — E669 Obesity, unspecified: Secondary | ICD-10-CM | POA: Diagnosis not present

## 2019-02-04 DIAGNOSIS — Z7902 Long term (current) use of antithrombotics/antiplatelets: Secondary | ICD-10-CM | POA: Diagnosis not present

## 2019-02-04 DIAGNOSIS — Z6834 Body mass index (BMI) 34.0-34.9, adult: Secondary | ICD-10-CM | POA: Diagnosis not present

## 2019-02-04 DIAGNOSIS — R931 Abnormal findings on diagnostic imaging of heart and coronary circulation: Secondary | ICD-10-CM | POA: Diagnosis not present

## 2019-02-04 DIAGNOSIS — I25118 Atherosclerotic heart disease of native coronary artery with other forms of angina pectoris: Secondary | ICD-10-CM | POA: Diagnosis not present

## 2019-02-04 DIAGNOSIS — Z7982 Long term (current) use of aspirin: Secondary | ICD-10-CM | POA: Diagnosis not present

## 2019-02-04 DIAGNOSIS — Z79899 Other long term (current) drug therapy: Secondary | ICD-10-CM | POA: Diagnosis not present

## 2019-02-04 DIAGNOSIS — I10 Essential (primary) hypertension: Secondary | ICD-10-CM | POA: Diagnosis not present

## 2019-02-04 LAB — BASIC METABOLIC PANEL
Anion gap: 8 (ref 5–15)
BUN: 14 mg/dL (ref 8–23)
CHLORIDE: 104 mmol/L (ref 98–111)
CO2: 26 mmol/L (ref 22–32)
CREATININE: 0.96 mg/dL (ref 0.61–1.24)
Calcium: 9 mg/dL (ref 8.9–10.3)
GFR calc Af Amer: >60 mL/min (ref >60)
GFR calc non Af Amer: >60 mL/min (ref >60)
Glucose, Bld: 112 mg/dL — ABNORMAL HIGH (ref 70–99)
Potassium: 4.1 mmol/L (ref 3.5–5.1)
Sodium: 138 mmol/L (ref 135–145)

## 2019-02-04 LAB — CBC
HCT: 37.5 % — ABNORMAL LOW (ref 39.0–52.0)
Hemoglobin: 12.6 g/dL — ABNORMAL LOW (ref 13.0–17.0)
MCH: 33.6 pg (ref 26.0–34.0)
MCHC: 33.6 g/dL (ref 30.0–36.0)
MCV: 100 fL (ref 80.0–100.0)
Platelets: 232 10*3/uL (ref 150–400)
RBC: 3.75 MIL/uL — ABNORMAL LOW (ref 4.22–5.81)
RDW: 12.9 % (ref 11.5–15.5)
WBC: 7.3 10*3/uL (ref 4.0–10.5)
nRBC: 0 % (ref 0.0–0.2)

## 2019-02-04 NOTE — Progress Notes (Signed)
CARDIAC REHAB PHASE I   PRE:  Rate/Rhythm: 73 SR  BP:  Supine:   Sitting: 127/86  Standing:    SaO2:   MODE:  Ambulation: 800 ft   POST:  Rate/Rhythm: 80 SR  BP:  Supine:   Sitting: 140/91  Standing:    SaO2: 97%RA 0802-0900 Pt walked 800 ft on RA with steady gait and tolerated well. No CP. Education completed with pt who voiced understanding. Stressed importance of plavix with stent. Discussed NTG use, ex ed, CRP 2 and heart healthy food choices. Referred to [facility] CRP 2 and pt very much wants to attend. Pt is very active and is ready to begin CRP as soon as appropriate.   Graylon Good, RN BSN  02/04/2019 8:57 AM

## 2019-02-04 NOTE — Telephone Encounter (Signed)
Patient called and confirmed 3/6 at 2pm

## 2019-02-04 NOTE — Discharge Summary (Addendum)
Discharge Summary    Patient ID: Jesse Norton,  MRN: [id number], DOB/AGE: [birth year] 73 y.o.  Admit date: 02/03/2019 Discharge date: 02/04/2019  Primary Care Provider: Golden Pop A Primary Cardiologist: Dr. Saunders Revel  Discharge Diagnoses    Principal Problem:   Coronary artery disease of native artery of native heart with stable angina pectoris [facility]) Active Problems:   Abnormal CT scan of heart   Stable angina (HCC)   Allergies No Known Allergies  Diagnostic Studies/Procedures    Cath: 02/03/2019  Conclusions: 1. Severe, heavily calcified mid LAD stenosis (80-90%), unchanged from diagnostic catheterization earlier this month. 2. Widely patent LCx stent. 3. Normal left ventricular filling pressure. 4. Successful orbital atherectomy and IVUS-guided PCI to the mid LAD using a Resolute Onyx 3.0 x 15 mm drug-eluting stent (postdilated to 3.6 mm) with 0% residual stenosis and TIMI-3 flow.  Recommendations: 1. Overnight extended recovery. 2. Continue dual antiplatelet therapy with aspirin and clopidogrel for at least 6 months. 3. Aggressive secondary prevention.  Nelva Bush, MD _____________   History of Present Illness     73 year old male with the past medical history of hypertension, Barrett's esophagus, gout, BPH, gastritis, and colon polyps. He has a history of chest pain that dates back several years with 2 emergency department visits in the past.  In July 2019, he was evaluated in the [facility] emergency department with left-sided chest pain that radiated to his arm.  Symptoms were accompanied by nausea and dyspnea.  ER evaluation was unremarkable.  He was referred to Dr. Saunders Revel, who saw him on October 10 and he subsequently underwent exercise treadmill testing in November 2019, showing good exercise tolerance, but was notable for 1 mm horizontal inferolateral ST segment depression as well as frequent PVCs.  In the setting of this intermediate study, recommendation was  made for cardiac CT angiography.  This was finally performed January 13 and showed significant mid LAD disease with a fractional flow reserve of 0.6.  He also had significant distal left circumflex disease.  In this setting, catheterization was been recommended.  Since his last visit, he has had occasional episodes of very focal/pinpoint left-sided chest discomfort which just lasts a second or so at a time.  He exercises several days a week and does not typically experience chest pain or activity limiting dyspnea.  He denied palpitations, PND, orthopnea, dizziness, syncope, edema, or early satiety. Presented for cath on 01/21/2019 with disease in the mLAD, heavily calcified. Started on ASA and plavix. Brought back for CSI.   Hospital Course     Underwent outpatient cardiac cath noted above with successful orbital atherectomy and IVUS guided PCI to the mLAD. No complications noted post cath. Worked well with cardiac rehab, no recurrent chest pain. Will be continued with ASA/plavix for at least 6 months. Already on BB and statin.   Jesse Norton was seen by Dr. Irish Lack and determined stable for discharge home. Follow up in the office has been arranged. Medications are listed below.   _____________  Discharge Vitals Blood pressure 127/86, pulse 60, temperature (!) 97.5 F (36.4 C), temperature source Oral, resp. rate 14, height 5\' 11"  (1.803 m), weight 113 kg, SpO2 97 %.  Filed Weights   02/03/19 0705 02/04/19 0548  Weight: 112.5 kg 113 kg    Labs & Radiologic Studies    CBC Recent Labs    02/03/19 1406 02/04/19 0529  WBC 5.7 7.3  HGB 12.6* 12.6*  HCT 37.7* 37.5*  MCV 98.7 100.0  PLT 244 258   Basic Metabolic Panel Recent Labs    02/03/19 1406 02/04/19 0529  NA  --  138  K  --  4.1  CL  --  104  CO2  --  26  GLUCOSE  --  112*  BUN  --  14  CREATININE 0.90 0.96  CALCIUM  --  9.0   Liver Function Tests No results for input(s): AST, ALT, ALKPHOS, BILITOT, PROT, ALBUMIN in  the last 72 hours. No results for input(s): LIPASE, AMYLASE in the last 72 hours. Cardiac Enzymes No results for input(s): CKTOTAL, CKMB, CKMBINDEX, TROPONINI in the last 72 hours. BNP Invalid input(s): POCBNP D-Dimer No results for input(s): DDIMER in the last 72 hours. Hemoglobin A1C No results for input(s): HGBA1C in the last 72 hours. Fasting Lipid Panel No results for input(s): CHOL, HDL, LDLCALC, TRIG, CHOLHDL, LDLDIRECT in the last 72 hours. Thyroid Function Tests No results for input(s): TSH, T4TOTAL, T3FREE, THYROIDAB in the last 72 hours.  Invalid input(s): FREET3 _____________  No results found. Disposition   Pt is being discharged home today in good condition.  Follow-up Plans & Appointments    Follow-up Information    Theora Gianotti, NP Follow up on 02/14/2019.   Specialties:  Nurse Practitioner, Cardiology, Radiology Why:  at 2pm for your follow up appt.  Contact information: [place] [address] [phone]          Discharge Instructions    Amb Referral to Cardiac Rehabilitation   Complete by:  As directed    Diagnosis:  Coronary Stents   Diet - low sodium heart healthy   Complete by:  As directed    Discharge instructions   Complete by:  As directed    Radial Site Care Refer to this sheet in the next few weeks. These instructions provide you with information on caring for yourself after your procedure. Your caregiver may also give you more specific instructions. Your treatment has been planned according to current medical practices, but problems sometimes occur. Call your caregiver if you have any problems or questions after your procedure. HOME CARE INSTRUCTIONS You may shower the day after the procedure.Remove the bandage (dressing) and gently wash the site with plain soap and water.Gently pat the site dry.  Do not apply powder or lotion to the site.  Do not submerge the affected site in water for 3 to 5  days.  Inspect the site at least twice daily.  Do not flex or bend the affected arm for 24 hours.  No lifting over 5 pounds (2.3 kg) for 5 days after your procedure.  Do not drive home if you are discharged the same day of the procedure. Have someone else drive you.  You may drive 24 hours after the procedure unless otherwise instructed by your caregiver.  What to expect: Any bruising will usually fade within 1 to 2 weeks.  Blood that collects in the tissue (hematoma) may be painful to the touch. It should usually decrease in size and tenderness within 1 to 2 weeks.  SEEK IMMEDIATE MEDICAL CARE IF: You have unusual pain at the radial site.  You have redness, warmth, swelling, or pain at the radial site.  You have drainage (other than a small amount of blood on the dressing).  You have chills.  You have a fever or persistent symptoms for more than 72 hours.  You have a fever and your symptoms suddenly get worse.  Your  arm becomes pale, cool, tingly, or numb.  You have heavy bleeding from the site. Hold pressure on the site.   PLEASE DO NOT MISS ANY DOSES OF YOUR PLAVIX!!!!! Also keep a log of you blood pressures and bring back to your follow up appt. Please call the office with any questions.   Patients taking blood thinners should generally stay away from medicines like ibuprofen, Advil, Motrin, naproxen, and Aleve due to risk of stomach bleeding. You may take Tylenol as directed or talk to your primary doctor about alternatives.   Increase activity slowly   Complete by:  As directed      Discharge Medications     Medication List    TAKE these medications   amLODipine 10 MG tablet Commonly known as:  NORVASC Take 1 tablet (10 mg total) by mouth daily.   aspirin EC 81 MG tablet Take 81 mg by mouth daily.   atorvastatin 40 MG tablet Commonly known as:  LIPITOR Take 1 tablet (40 mg total) by mouth daily.   BIOFLEX PO Take 1 tablet by mouth daily.   clopidogrel 75 MG  tablet Commonly known as:  PLAVIX Take 1 tablet (75 mg total) by mouth daily with breakfast.   EMERGEN-C IMMUNE PO Take 1 tablet by mouth every other day.   febuxostat 40 MG tablet Commonly known as:  ULORIC Take 1 tablet (40 mg total) by mouth daily.   FLUoxetine 20 MG capsule Commonly known as:  PROZAC Take 1 capsule (20 mg total) by mouth daily.   furosemide 20 MG tablet Commonly known as:  LASIX Take 1 tablet (20 mg total) by mouth daily for 30 days.   LORazepam 1 MG tablet Commonly known as:  ATIVAN Take 0.5-1 tablets (0.5-1 mg total) by mouth daily as needed for anxiety.   losartan 100 MG tablet Commonly known as:  COZAAR Take 1 tablet (100 mg total) by mouth daily.   metoprolol tartrate 25 MG tablet Commonly known as:  LOPRESSOR Take 1 tablet (25 mg total) by mouth 2 (two) times daily.   multivitamin tablet Take 1 tablet by mouth daily.   pantoprazole 20 MG tablet Commonly known as:  PROTONIX Take 1 tablet (20 mg total) by mouth 2 (two) times daily.   potassium chloride 10 MEQ tablet Commonly known as:  K-DUR Take 1 tablet (10 mEq total) by mouth daily.      Acute coronary syndrome (MI, NSTEMI, STEMI, etc) this admission?: No.     Outstanding Labs/Studies   N/a  Duration of Discharge Encounter   Greater than 30 minutes including physician time.  Signed, Reino Bellis NP-C 02/04/2019, 11:11 AM   I have examined the patient and reviewed assessment and plan and discussed with patient.  Agree with above as stated.    GEN: Well nourished, well developed, in no acute distress  HEENT: normal  Neck: no JVD, carotid bruits, or masses Cardiac: RRR; no murmurs, rubs, or gallops,no edema  Respiratory:  clear to auscultation bilaterally, normal work of breathing GI: soft, nontender, nondistended,  MS: no deformity or atrophy ; no hematoma at the right wrist Skin: warm and dry, no rash Neuro:  Strength and sensation are intact Psych: euthymic mood, full  affect  COntinue DAPT and secondary prevention.  Did well with cardiac rehab.    Larae Grooms

## 2019-02-06 ENCOUNTER — Ambulatory Visit: Payer: Medicare Other | Admitting: Nurse Practitioner

## 2019-02-12 ENCOUNTER — Encounter: Payer: Self-pay | Admitting: Family Medicine

## 2019-02-12 ENCOUNTER — Ambulatory Visit (INDEPENDENT_AMBULATORY_CARE_PROVIDER_SITE_OTHER): Payer: Medicare Other | Admitting: Family Medicine

## 2019-02-12 VITALS — BP 137/83 | HR 61 | Temp 97.9°F | Wt 259.0 lb

## 2019-02-12 DIAGNOSIS — E7800 Pure hypercholesterolemia, unspecified: Secondary | ICD-10-CM | POA: Diagnosis not present

## 2019-02-12 DIAGNOSIS — D692 Other nonthrombocytopenic purpura: Secondary | ICD-10-CM | POA: Diagnosis not present

## 2019-02-12 DIAGNOSIS — F321 Major depressive disorder, single episode, moderate: Secondary | ICD-10-CM

## 2019-02-12 DIAGNOSIS — I10 Essential (primary) hypertension: Secondary | ICD-10-CM

## 2019-02-12 DIAGNOSIS — E785 Hyperlipidemia, unspecified: Secondary | ICD-10-CM | POA: Insufficient documentation

## 2019-02-12 DIAGNOSIS — I2510 Atherosclerotic heart disease of native coronary artery without angina pectoris: Secondary | ICD-10-CM | POA: Diagnosis not present

## 2019-02-12 DIAGNOSIS — I25118 Atherosclerotic heart disease of native coronary artery with other forms of angina pectoris: Secondary | ICD-10-CM | POA: Diagnosis not present

## 2019-02-12 DIAGNOSIS — I2583 Coronary atherosclerosis due to lipid rich plaque: Secondary | ICD-10-CM | POA: Diagnosis not present

## 2019-02-12 LAB — LP+ALT+AST PICCOLO, WAIVED
ALT (SGPT) Piccolo, Waived: 30 U/L (ref 10–47)
AST (SGOT) Piccolo, Waived: 38 U/L (ref 11–38)
Chol/HDL Ratio Piccolo,Waive: 2.1 mg/dL
Cholesterol Piccolo, Waived: 124 mg/dL (ref <200)
HDL Chol Piccolo, Waived: 58 mg/dL (ref >59)
LDL Chol Calc Piccolo Waived: 55 mg/dL (ref <100)
Triglycerides Piccolo,Waived: 58 mg/dL (ref <150)
VLDL Chol Calc Piccolo,Waive: 12 mg/dL (ref <30)

## 2019-02-12 NOTE — Progress Notes (Signed)
BP 137/83   Pulse 61   Temp 97.9 F (36.6 C) (Oral)   Wt 259 lb (117.5 kg)   SpO2 97%   BMI 36.12 kg/m    Subjective:    Patient ID: Jesse Norton, male    DOB: [birth year], 73 y.o.   MRN: [id number]  HPI: COYE DAWOOD is a 73 y.o. male  Chief Complaint  Patient presents with  . Depression  . Hyperlipidemia   Patient all in all doing really well has seen cardiology with stent placement and has done well. Patient reset his cholesterol goals and is taking Lipitor 40 mg without problems and good control. Taking Plavix and aspirin without problems for reepithelialization of the stent. Depression doing much better with fluoxetine sleeping well with normal energy.  Relevant past medical, surgical, family and social history reviewed and updated as indicated. Interim medical history since our last visit reviewed. Allergies and medications reviewed and updated.  Review of Systems  Constitutional: Negative.   Respiratory: Negative.   Cardiovascular: Negative.     Per HPI unless specifically indicated above     Objective:    BP 137/83   Pulse 61   Temp 97.9 F (36.6 C) (Oral)   Wt 259 lb (117.5 kg)   SpO2 97%   BMI 36.12 kg/m   Wt Readings from Last 3 Encounters:  02/12/19 259 lb (117.5 kg)  02/04/19 249 lb 1.9 oz (113 kg)  01/21/19 240 lb 11.2 oz (109.2 kg)    Physical Exam Constitutional:      Appearance: He is well-developed.  HENT:     Head: Normocephalic and atraumatic.  Eyes:     Conjunctiva/sclera: Conjunctivae normal.  Neck:     Musculoskeletal: Normal range of motion.  Cardiovascular:     Rate and Rhythm: Normal rate and regular rhythm.     Heart sounds: Normal heart sounds.  Pulmonary:     Effort: Pulmonary effort is normal.     Breath sounds: Normal breath sounds.  Musculoskeletal: Normal range of motion.  Skin:    Findings: No erythema.  Neurological:     Mental Status: He is alert and oriented to person, place, and time.  Psychiatric:         Behavior: Behavior normal.        Thought Content: Thought content normal.        Judgment: Judgment normal.     Results for orders placed or performed during the hospital encounter of 02/03/19  CBC  Result Value Ref Range   WBC 5.7 4.0 - 10.5 K/uL   RBC 3.82 (L) 4.22 - 5.81 MIL/uL   Hemoglobin 12.6 (L) 13.0 - 17.0 g/dL   HCT 37.7 (L) 39.0 - 52.0 %   MCV 98.7 80.0 - 100.0 fL   MCH 33.0 26.0 - 34.0 pg   MCHC 33.4 30.0 - 36.0 g/dL   RDW 12.7 11.5 - 15.5 %   Platelets 244 150 - 400 K/uL   nRBC 0.0 0.0 - 0.2 %  Creatinine, serum  Result Value Ref Range   Creatinine, Ser 0.90 0.61 - 1.24 mg/dL   GFR calc non Af Amer >60 >60 mL/min   GFR calc Af Amer >60 >60 mL/min  Basic metabolic panel  Result Value Ref Range   Sodium 138 135 - 145 mmol/L   Potassium 4.1 3.5 - 5.1 mmol/L   Chloride 104 98 - 111 mmol/L   CO2 26 22 - 32 mmol/L   Glucose, Bld 112 (H)  70 - 99 mg/dL   BUN 14 8 - 23 mg/dL   Creatinine, Ser 0.96 0.61 - 1.24 mg/dL   Calcium 9.0 8.9 - 10.3 mg/dL   GFR calc non Af Amer >60 >60 mL/min   GFR calc Af Amer >60 >60 mL/min   Anion gap 8 5 - 15  CBC  Result Value Ref Range   WBC 7.3 4.0 - 10.5 K/uL   RBC 3.75 (L) 4.22 - 5.81 MIL/uL   Hemoglobin 12.6 (L) 13.0 - 17.0 g/dL   HCT 37.5 (L) 39.0 - 52.0 %   MCV 100.0 80.0 - 100.0 fL   MCH 33.6 26.0 - 34.0 pg   MCHC 33.6 30.0 - 36.0 g/dL   RDW 12.9 11.5 - 15.5 %   Platelets 232 150 - 400 K/uL   nRBC 0.0 0.0 - 0.2 %  POCT Activated clotting time  Result Value Ref Range   Activated Clotting Time 274 seconds  POCT Activated clotting time  Result Value Ref Range   Activated Clotting Time 285 seconds  POCT Activated clotting time  Result Value Ref Range   Activated Clotting Time 268 seconds  POCT Activated clotting time  Result Value Ref Range   Activated Clotting Time 274 seconds      Assessment & Plan:   Problem List Items Addressed This Visit      Cardiovascular and Mediastinum   Essential hypertension     The current medical regimen is effective;  continue present plan and medications.       Coronary artery disease with stable angina pectoris [facility])    The current medical regimen is effective;  continue present plan and medications.       Purpura senilis ([place])    Reviewed and stable        Other   Depression, major, single episode, moderate ([place])    The current medical regimen is effective;  continue present plan and medications.       Hypercholesteremia    Reviewed cholesterol and indications will continue current medication       Other Visit Diagnoses    Hypercholesterolemia    -  Primary   Relevant Orders   LP+ALT+AST [place], Waived       Follow up plan: Return for Physical Exam this summer.

## 2019-02-12 NOTE — Assessment & Plan Note (Signed)
The current medical regimen is effective;  continue present plan and medications.  

## 2019-02-12 NOTE — Assessment & Plan Note (Signed)
Reviewed and stable.

## 2019-02-12 NOTE — Assessment & Plan Note (Signed)
Reviewed cholesterol and indications will continue current medication

## 2019-02-13 ENCOUNTER — Encounter: Payer: Self-pay | Admitting: *Deleted

## 2019-02-13 ENCOUNTER — Encounter: Payer: Medicare Other | Attending: Internal Medicine | Admitting: *Deleted

## 2019-02-13 VITALS — Ht 68.9 in | Wt 252.2 lb

## 2019-02-13 DIAGNOSIS — I10 Essential (primary) hypertension: Secondary | ICD-10-CM | POA: Insufficient documentation

## 2019-02-13 DIAGNOSIS — M109 Gout, unspecified: Secondary | ICD-10-CM | POA: Insufficient documentation

## 2019-02-13 DIAGNOSIS — Z79899 Other long term (current) drug therapy: Secondary | ICD-10-CM | POA: Insufficient documentation

## 2019-02-13 DIAGNOSIS — F419 Anxiety disorder, unspecified: Secondary | ICD-10-CM | POA: Insufficient documentation

## 2019-02-13 DIAGNOSIS — Z7982 Long term (current) use of aspirin: Secondary | ICD-10-CM | POA: Diagnosis not present

## 2019-02-13 DIAGNOSIS — Z7902 Long term (current) use of antithrombotics/antiplatelets: Secondary | ICD-10-CM | POA: Diagnosis not present

## 2019-02-13 DIAGNOSIS — Z955 Presence of coronary angioplasty implant and graft: Secondary | ICD-10-CM | POA: Insufficient documentation

## 2019-02-13 DIAGNOSIS — K219 Gastro-esophageal reflux disease without esophagitis: Secondary | ICD-10-CM | POA: Insufficient documentation

## 2019-02-13 NOTE — Progress Notes (Signed)
Cardiac Individual Treatment Plan  Patient Details  Name: Jesse Norton MRN: [id number] Date of Birth: 30-Jan-1946 Referring Provider:     Cardiac Rehab from 02/13/2019 in [facility] Cardiac and Pulmonary Rehab  Referring Provider  End      Initial Encounter Date:    Cardiac Rehab from 02/13/2019 in [facility] Cardiac and Pulmonary Rehab  Date  02/13/19      Visit Diagnosis: Status post coronary artery stent placement  Patient's Home Medications on Admission:  Current Outpatient Medications:  .  amLODipine (NORVASC) 10 MG tablet, Take 1 tablet (10 mg total) by mouth daily., Disp: 90 tablet, Rfl: 4 .  aspirin EC 81 MG tablet, Take 81 mg by mouth daily., Disp: , Rfl:  .  atorvastatin (LIPITOR) 40 MG tablet, Take 1 tablet (40 mg total) by mouth daily., Disp: 90 tablet, Rfl: 2 .  Bioflavonoid Products (BIOFLEX PO), Take 1 tablet by mouth daily. , Disp: , Rfl:  .  clopidogrel (PLAVIX) 75 MG tablet, Take 1 tablet (75 mg total) by mouth daily with breakfast., Disp: 90 tablet, Rfl: 3 .  febuxostat (ULORIC) 40 MG tablet, Take 1 tablet (40 mg total) by mouth daily., Disp: 90 tablet, Rfl: 4 .  FLUoxetine (PROZAC) 20 MG capsule, Take 1 capsule (20 mg total) by mouth daily., Disp: 90 capsule, Rfl: 3 .  furosemide (LASIX) 20 MG tablet, Take 1 tablet (20 mg total) by mouth daily for 30 days., Disp: 30 tablet, Rfl: 0 .  LORazepam (ATIVAN) 1 MG tablet, Take 0.5-1 tablets (0.5-1 mg total) by mouth daily as needed for anxiety., Disp: 30 tablet, Rfl: 1 .  losartan (COZAAR) 100 MG tablet, Take 1 tablet (100 mg total) by mouth daily., Disp: 90 tablet, Rfl: 3 .  metoprolol tartrate (LOPRESSOR) 25 MG tablet, Take 1 tablet (25 mg total) by mouth 2 (two) times daily., Disp: 180 tablet, Rfl: 3 .  Multiple Vitamin (MULTIVITAMIN) tablet, Take 1 tablet by mouth daily., Disp: , Rfl:  .  Multiple Vitamins-Minerals (EMERGEN-C IMMUNE PO), Take 1 tablet by mouth every other day. , Disp: , Rfl:  .  pantoprazole (PROTONIX) 20 MG  tablet, Take 1 tablet (20 mg total) by mouth 2 (two) times daily., Disp: 180 tablet, Rfl: 4 .  potassium chloride (K-DUR) 10 MEQ tablet, Take 1 tablet (10 mEq total) by mouth daily., Disp: 30 tablet, Rfl: 0  Past Medical History: Past Medical History:  Diagnosis Date  . Anxiety   . Atypical chest pain    a. 10/2018 ETT: Good ex tolerance, 14m horizontal inflat ST depression, freq PVC's->Intermediate; b. 12/2018 Cardiac CTA: LM nl, LAD FFRct 0.6 in mLAD, LCX FFRct 0.99p, 0.860m0.77d, RCA no signif dzs, FFRct 0.89d->Rec cath.  . Barrett's esophagus   . Colon polyps   . Duodenal adenoma   . Gastritis   . GERD (gastroesophageal reflux disease)   . Gout   . H. pylori infection   . H/O urethral stricture   . Hemorrhoids   . Hiatal hernia   . Hypertension   . Stomach ulcer     Tobacco Use: Social History   Tobacco Use  Smoking Status Never Smoker  Smokeless Tobacco Never Used    Labs: Recent Review Flowsheet Data    Labs for ITP Cardiac and Pulmonary Rehab Latest Ref Rng & Units 05/25/2015 05/30/2016 06/07/2017 06/26/2018 02/12/2019   Cholestrol <200 mg/dL 186 181 166 169 124   LDLCALC 0 - 99 mg/dL 109(H) 107(H) 85 98 -   HDL >39  mg/dL 62 62 56 59 -   Trlycerides <150 mg/dL 75 61 127 62 58       Exercise Target Goals: Exercise Program Goal: Individual exercise prescription set using results from initial 6 min walk test and THRR while considering  patient's activity barriers and safety.   Exercise Prescription Goal: Initial exercise prescription builds to 30-45 minutes a day of aerobic activity, 2-3 days per week.  Home exercise guidelines will be given to patient during program as part of exercise prescription that the participant will acknowledge.  Activity Barriers & Risk Stratification: Activity Barriers & Cardiac Risk Stratification - 02/13/19 1432      Activity Barriers & Cardiac Risk Stratification   Activity Barriers  Back Problems    Cardiac Risk Stratification   Moderate       6 Minute Walk: 6 Minute Walk    Row Name 02/13/19 1435         6 Minute Walk   Phase  Initial     Distance  1200 feet     Walk Time  6 minutes     # of Rest Breaks  0     MPH  2.3     METS  2.08     RPE  10     VO2 Peak  7.3     Symptoms  No     Resting HR  60 bpm     Resting BP  132/68     Resting Oxygen Saturation   99 %     Exercise Oxygen Saturation  during 6 min walk  98 %     Max Ex. HR  107 bpm     Max Ex. BP  142/70     2 Minute Post BP  132/76        Oxygen Initial Assessment:   Oxygen Re-Evaluation:   Oxygen Discharge (Final Oxygen Re-Evaluation):   Initial Exercise Prescription: Initial Exercise Prescription - 02/13/19 1400      Date of Initial Exercise RX and Referring Provider   Date  02/13/19    Referring Provider  End      Treadmill   MPH  2.3    Grade  0    Minutes  15    METs  2.7      Recumbant Bike   Level  5    RPM  60    Watts  25    Minutes  15    METs  2.7      Elliptical   Level  1    Speed  2.6    Minutes  15      Prescription Details   Frequency (times per week)  3    Duration  Progress to 45 minutes of aerobic exercise without signs/symptoms of physical distress      Intensity   THRR 40-80% of Max Heartrate  94-129    Ratings of Perceived Exertion  11-13    Perceived Dyspnea  0-4      Resistance Training   Training Prescription  Yes    Weight  4 lb    Reps  10-15       Perform Capillary Blood Glucose checks as needed.  Exercise Prescription Changes: Exercise Prescription Changes    Row Name 02/13/19 1400             Response to Exercise   Blood Pressure (Admit)  132/68       Blood Pressure (Exercise)  142/70  Blood Pressure (Exit)  132/76       Heart Rate (Admit)  63 bpm       Heart Rate (Exercise)  107 bpm       Heart Rate (Exit)  53 bpm       Oxygen Saturation (Admit)  99 %       Oxygen Saturation (Exit)  98 %       Rating of Perceived Exertion (Exercise)  10           Exercise Comments:   Exercise Goals and Review: Exercise Goals    Row Name 02/13/19 1434             Exercise Goals   Increase Physical Activity  Yes       Intervention  Provide advice, education, support and counseling about physical activity/exercise needs.;Develop an individualized exercise prescription for aerobic and resistive training based on initial evaluation findings, risk stratification, comorbidities and participant's personal goals.       Expected Outcomes  Short Term: Attend rehab on a regular basis to increase amount of physical activity.;Long Term: Add in home exercise to make exercise part of routine and to increase amount of physical activity.;Long Term: Exercising regularly at least 3-5 days a week.       Increase Strength and Stamina  Yes       Intervention  Provide advice, education, support and counseling about physical activity/exercise needs.;Develop an individualized exercise prescription for aerobic and resistive training based on initial evaluation findings, risk stratification, comorbidities and participant's personal goals.       Expected Outcomes  Short Term: Increase workloads from initial exercise prescription for resistance, speed, and METs.;Short Term: Perform resistance training exercises routinely during rehab and add in resistance training at home;Long Term: Improve cardiorespiratory fitness, muscular endurance and strength as measured by increased METs and functional capacity (6MWT)       Able to understand and use rate of perceived exertion (RPE) scale  Yes       Intervention  Provide education and explanation on how to use RPE scale       Expected Outcomes  Short Term: Able to use RPE daily in rehab to express subjective intensity level;Long Term:  Able to use RPE to guide intensity level when exercising independently       Able to understand and use Dyspnea scale  Yes       Intervention  Provide education and explanation on how to use Dyspnea scale        Expected Outcomes  Short Term: Able to use Dyspnea scale daily in rehab to express subjective sense of shortness of breath during exertion;Long Term: Able to use Dyspnea scale to guide intensity level when exercising independently       Knowledge and understanding of Target Heart Rate Range (THRR)  Yes       Intervention  Provide education and explanation of THRR including how the numbers were predicted and where they are located for reference       Expected Outcomes  Short Term: Able to state/look up THRR;Short Term: Able to use daily as guideline for intensity in rehab;Long Term: Able to use THRR to govern intensity when exercising independently       Able to check pulse independently  Yes       Intervention  Provide education and demonstration on how to check pulse in carotid and radial arteries.;Review the importance of being able to check your own pulse for safety during independent  exercise       Expected Outcomes  Short Term: Able to explain why pulse checking is important during independent exercise;Long Term: Able to check pulse independently and accurately       Understanding of Exercise Prescription  Yes       Intervention  Provide education, explanation, and written materials on patient's individual exercise prescription       Expected Outcomes  Short Term: Able to explain program exercise prescription;Long Term: Able to explain home exercise prescription to exercise independently          Exercise Goals Re-Evaluation :   Discharge Exercise Prescription (Final Exercise Prescription Changes): Exercise Prescription Changes - 02/13/19 1400      Response to Exercise   Blood Pressure (Admit)  132/68    Blood Pressure (Exercise)  142/70    Blood Pressure (Exit)  132/76    Heart Rate (Admit)  63 bpm    Heart Rate (Exercise)  107 bpm    Heart Rate (Exit)  53 bpm    Oxygen Saturation (Admit)  99 %    Oxygen Saturation (Exit)  98 %    Rating of Perceived Exertion (Exercise)  10        Nutrition:  Target Goals: Understanding of nutrition guidelines, daily intake of sodium '1500mg'$ , cholesterol '200mg'$ , calories 30% from fat and 7% or less from saturated fats, daily to have 5 or more servings of fruits and vegetables.  Biometrics: Pre Biometrics - 02/13/19 1433      Pre Biometrics   Height  5' 8.9" (1.75 m)    Weight  252 lb 3.2 oz (114.4 kg)    Waist Circumference  48 inches    Hip Circumference  44.25 inches    Waist to Hip Ratio  1.08 %    BMI (Calculated)  37.35    Single Leg Stand  22.5 seconds        Nutrition Therapy Plan and Nutrition Goals: Nutrition Therapy & Goals - 02/13/19 1420      Intervention Plan   Intervention  Prescribe, educate and counsel regarding individualized specific dietary modifications aiming towards targeted core components such as weight, hypertension, lipid management, diabetes, heart failure and other comorbidities.;Nutrition handout(s) given to patient.    Expected Outcomes  Short Term Goal: Understand basic principles of dietary content, such as calories, fat, sodium, cholesterol and nutrients.;Short Term Goal: A plan has been developed with personal nutrition goals set during dietitian appointment.;Long Term Goal: Adherence to prescribed nutrition plan.       Nutrition Assessments: Nutrition Assessments - 02/13/19 1420      MEDFICTS Scores   Pre Score  78       Nutrition Goals Re-Evaluation:   Nutrition Goals Discharge (Final Nutrition Goals Re-Evaluation):   Psychosocial: Target Goals: Acknowledge presence or absence of significant depression and/or stress, maximize coping skills, provide positive support system. Participant is able to verbalize types and ability to use techniques and skills needed for reducing stress and depression.   Initial Review & Psychosocial Screening: Initial Psych Review & Screening - 02/13/19 1411      Initial Review   Current issues with  Current Stress Concerns    Comments   Jesse Norton did not go into detail about his stressors during orientation, but did say it has gotten better since his MD put him on Prozac in January. One of his top three goals is to learn how to manage stress better      Family Dynamics   Good  Support System?  Yes   spouse, son, friend     Barriers   Psychosocial barriers to participate in program  There are no identifiable barriers or psychosocial needs.;The patient should benefit from training in stress management and relaxation.      Screening Interventions   Interventions  Encouraged to exercise;Program counselor consult;Provide feedback about the scores to participant;To provide support and resources with identified psychosocial needs    Expected Outcomes  Short Term goal: Utilizing psychosocial counselor, staff and physician to assist with identification of specific Stressors or current issues interfering with healing process. Setting desired goal for each stressor or current issue identified.;Long Term Goal: Stressors or current issues are controlled or eliminated.;Short Term goal: Identification and review with participant of any Quality of Life or Depression concerns found by scoring the questionnaire.;Long Term goal: The participant improves quality of Life and PHQ9 Scores as seen by post scores and/or verbalization of changes       Quality of Life Scores:  Quality of Life - 02/13/19 1420      Quality of Life   Select  Quality of Life      Quality of Life Scores   Health/Function Pre  25.27 %    Socioeconomic Pre  30 %    Psych/Spiritual Pre  25.79 %    Family Pre  25.6 %    GLOBAL Pre  26.4 %      Scores of 19 and below usually indicate a poorer quality of life in these areas.  A difference of  2-3 points is a clinically meaningful difference.  A difference of 2-3 points in the total score of the Quality of Life Index has been associated with significant improvement in overall quality of life, self-image, physical symptoms, and  general health in studies assessing change in quality of life.  PHQ-9: Recent Review Flowsheet Data    Depression screen [facility] 2/9 02/13/2019 02/12/2019 05/23/2018 12/25/2017 05/18/2017   Decreased Interest 0 0 0 0 0   Down, Depressed, Hopeless 0 0 1 0 0   PHQ - 2 Score 0 0 1 0 0   Altered sleeping '1 1 1 '$ - 0   Tired, decreased energy 0 0 1 - 1   Change in appetite 0 0 0 - 0   Feeling bad or failure about yourself  0 0 0 - 0   Trouble concentrating 0 0 1 - 0   Moving slowly or fidgety/restless 0 0 1 - 0   Suicidal thoughts 0 0 0 - 0   PHQ-9 Score '1 1 5 '$ - 1   Difficult doing work/chores Not difficult at all Not difficult at all Not difficult at all - -     Interpretation of Total Score  Total Score Depression Severity:  1-4 = Minimal depression, 5-9 = Mild depression, 10-14 = Moderate depression, 15-19 = Moderately severe depression, 20-27 = Severe depression   Psychosocial Evaluation and Intervention:   Psychosocial Re-Evaluation:   Psychosocial Discharge (Final Psychosocial Re-Evaluation):   Vocational Rehabilitation: Provide vocational rehab assistance to qualifying candidates.   Vocational Rehab Evaluation & Intervention: Vocational Rehab - 02/13/19 1410      Initial Vocational Rehab Evaluation & Intervention   Assessment shows need for Vocational Rehabilitation  No       Education: Education Goals: Education classes will be provided on a variety of topics geared toward better understanding of heart health and risk factor modification. Participant will state understanding/return demonstration of topics presented as noted by education  test scores.  Learning Barriers/Preferences: Learning Barriers/Preferences - 02/13/19 1407      Learning Barriers/Preferences   Learning Barriers  None    Learning Preferences  Group Instruction;Pictoral;Individual Instruction;Verbal Instruction;Skilled Demonstration       Education Topics:  AED/CPR: - Group verbal and written  instruction with the use of models to demonstrate the basic use of the AED with the basic ABC's of resuscitation.   General Nutrition Guidelines/Fats and Fiber: -Group instruction provided by verbal, written material, models and posters to present the general guidelines for heart healthy nutrition. Gives an explanation and review of dietary fats and fiber.   Controlling Sodium/Reading Food Labels: -Group verbal and written material supporting the discussion of sodium use in heart healthy nutrition. Review and explanation with models, verbal and written materials for utilization of the food label.   Exercise Physiology & General Exercise Guidelines: - Group verbal and written instruction with models to review the exercise physiology of the cardiovascular system and associated critical values. Provides general exercise guidelines with specific guidelines to those with heart or lung disease.    Aerobic Exercise & Resistance Training: - Gives group verbal and written instruction on the various components of exercise. Focuses on aerobic and resistive training programs and the benefits of this training and how to safely progress through these programs..   Flexibility, Balance, Mind/Body Relaxation: Provides group verbal/written instruction on the benefits of flexibility and balance training, including mind/body exercise modes such as yoga, pilates and tai chi.  Demonstration and skill practice provided.   Stress and Anxiety: - Provides group verbal and written instruction about the health risks of elevated stress and causes of high stress.  Discuss the correlation between heart/lung disease and anxiety and treatment options. Review healthy ways to manage with stress and anxiety.   Depression: - Provides group verbal and written instruction on the correlation between heart/lung disease and depressed mood, treatment options, and the stigmas associated with seeking treatment.   Anatomy &  Physiology of the Heart: - Group verbal and written instruction and models provide basic cardiac anatomy and physiology, with the coronary electrical and arterial systems. Review of Valvular disease and Heart Failure   Cardiac Procedures: - Group verbal and written instruction to review commonly prescribed medications for heart disease. Reviews the medication, class of the drug, and side effects. Includes the steps to properly store meds and maintain the prescription regimen. (beta blockers and nitrates)   Cardiac Medications I: - Group verbal and written instruction to review commonly prescribed medications for heart disease. Reviews the medication, class of the drug, and side effects. Includes the steps to properly store meds and maintain the prescription regimen.   Cardiac Medications II: -Group verbal and written instruction to review commonly prescribed medications for heart disease. Reviews the medication, class of the drug, and side effects. (all other drug classes)    Go Sex-Intimacy & Heart Disease, Get SMART - Goal Setting: - Group verbal and written instruction through game format to discuss heart disease and the return to sexual intimacy. Provides group verbal and written material to discuss and apply goal setting through the application of the S.M.A.R.T. Method.   Other Matters of the Heart: - Provides group verbal, written materials and models to describe Stable Angina and Peripheral Artery. Includes description of the disease process and treatment options available to the cardiac patient.   Exercise & Equipment Safety: - Individual verbal instruction and demonstration of equipment use and safety with use of the equipment.  Cardiac Rehab from 02/13/2019 in [facility] Cardiac and Pulmonary Rehab  Date  02/13/19  Educator  [facility]  Instruction Review Code  1- Verbalizes Understanding      Infection Prevention: - Provides verbal and written material to individual with discussion of  infection control including proper hand washing and proper equipment cleaning during exercise session.   Cardiac Rehab from 02/13/2019 in [facility] Cardiac and Pulmonary Rehab  Date  02/13/19  Educator  [facility]  Instruction Review Code  1- Verbalizes Understanding      Falls Prevention: - Provides verbal and written material to individual with discussion of falls prevention and safety.   Cardiac Rehab from 02/13/2019 in [facility] Cardiac and Pulmonary Rehab  Date  02/13/19  Educator  [facility]  Instruction Review Code  1- Verbalizes Understanding      Diabetes: - Individual verbal and written instruction to review signs/symptoms of diabetes, desired ranges of glucose level fasting, after meals and with exercise. Acknowledge that pre and post exercise glucose checks will be done for 3 sessions at entry of program.   Know Your Numbers and Risk Factors: -Group verbal and written instruction about important numbers in your health.  Discussion of what are risk factors and how they play a role in the disease process.  Review of Cholesterol, Blood Pressure, Diabetes, and BMI and the role they play in your overall health.   Sleep Hygiene: -Provides group verbal and written instruction about how sleep can affect your health.  Define sleep hygiene, discuss sleep cycles and impact of sleep habits. Review good sleep hygiene tips.    Other: -Provides group and verbal instruction on various topics (see comments)   Knowledge Questionnaire Score: Knowledge Questionnaire Score - 02/13/19 1409      Knowledge Questionnaire Score   Pre Score  24/26   correct answers reviewed with Mahamud. Focus on nutrition       Core Components/Risk Factors/Patient Goals at Admission: Personal Goals and Risk Factors at Admission - 02/13/19 1406      Core Components/Risk Factors/Patient Goals on Admission    Weight Management  Yes;Weight Loss    Intervention  Weight Management: Develop a combined nutrition and exercise program  designed to reach desired caloric intake, while maintaining appropriate intake of nutrient and fiber, sodium and fats, and appropriate energy expenditure required for the weight goal.;Weight Management: Provide education and appropriate resources to help participant work on and attain dietary goals.;Weight Management/Obesity: Establish reasonable short term and long term weight goals.    Admit Weight  252 lb (114.3 kg)    Goal Weight: Short Term  248 lb (112.5 kg)    Goal Weight: Long Term  215 lb (97.5 kg)    Expected Outcomes  Short Term: Continue to assess and modify interventions until short term weight is achieved;Long Term: Adherence to nutrition and physical activity/exercise program aimed toward attainment of established weight goal;Weight Loss: Understanding of general recommendations for a balanced deficit meal plan, which promotes 1-2 lb weight loss per week and includes a negative energy balance of [phone] kcal/d;Understanding recommendations for meals to include 15-35% energy as protein, 25-35% energy from fat, 35-60% energy from carbohydrates, less than '200mg'$  of dietary cholesterol, 20-35 gm of total fiber daily;Understanding of distribution of calorie intake throughout the day with the consumption of 4-5 meals/snacks    Hypertension  Yes    Intervention  Provide education on lifestyle modifcations including regular physical activity/exercise, weight management, moderate sodium restriction and increased consumption of fresh fruit, vegetables, and low  fat dairy, alcohol moderation, and smoking cessation.;Monitor prescription use compliance.    Expected Outcomes  Short Term: Continued assessment and intervention until BP is < 140/40m HG in hypertensive participants. < 130/819mHG in hypertensive participants with diabetes, heart failure or chronic kidney disease.;Long Term: Maintenance of blood pressure at goal levels.    Lipids  Yes    Intervention  Provide education and support for  participant on nutrition & aerobic/resistive exercise along with prescribed medications to achieve LDL '70mg'$ , HDL >'40mg'$ .    Expected Outcomes  Short Term: Participant states understanding of desired cholesterol values and is compliant with medications prescribed. Participant is following exercise prescription and nutrition guidelines.;Long Term: Cholesterol controlled with medications as prescribed, with individualized exercise RX and with personalized nutrition plan. Value goals: LDL < '70mg'$ , HDL > 40 mg.       Core Components/Risk Factors/Patient Goals Review:    Core Components/Risk Factors/Patient Goals at Discharge (Final Review):    ITP Comments: ITP Comments    Row Name 02/13/19 1339           ITP Comments  Med Review completed. Initial ITP created. [facility]          Comments: Initial ITP

## 2019-02-13 NOTE — Progress Notes (Signed)
Office Visit    Patient Name: Jesse Norton Date of Encounter: 02/14/2019  Primary Care Provider:  [name], MD Primary Cardiologist:  Nelva Bush, MD  Chief Complaint    73 year old male with a history of hypertension, Barrett's esophagus, gout, BPH, obesity, and CAD, who presents for follow-up after recent LAD and circumflex stenting.  Past Medical History    Past Medical History:  Diagnosis Date  . (HFpEF) heart failure with preserved ejection fraction ([place])    a. 01/2019 Cath: EF 55-60% w/ elevated LVEDP.  Marland Kitchen Anxiety   . Barrett's esophagus   . CAD (coronary artery disease)    a. 10/2018 ETT: 66mm horizontal inflat ST depression, freq PVC's->Intermediate; b. 12/2018 Cardiac CTA: LM nl, LAD FFRct 0.6 in mLAD, LCX FFRct 0.99p, 0.31m, 0.77d, RCA no signif dzs, FFRct 0.89d->Rec cath; c. 01/2019 PCI: LM nl, LAD 40p, 83m, D2 70, LCX 70p (2.75x15 Resolute Onyx DES), RCA min irregs. EF 55-65%. EDP 25-68mmHg; d. 01/2019 PCI: LAD 80-90 (atherectomy & 3x15 Resolute Onyx DES).  . Colon polyps   . Duodenal adenoma   . Gastritis   . GERD (gastroesophageal reflux disease)   . Gout   . H. pylori infection   . H/O urethral stricture   . Hemorrhoids   . Hiatal hernia   . Hypertension   . Stomach ulcer    Past Surgical History:  Procedure Laterality Date  . COLONOSCOPY    . COLONOSCOPY WITH PROPOFOL N/A 08/20/2018   Procedure: COLONOSCOPY WITH PROPOFOL;  Surgeon: Lollie Sails, MD;  Location: [facility] ENDOSCOPY;  Service: Endoscopy;  Laterality: N/A;  . COLONOSCOPY WITH PROPOFOL N/A 12/19/2018   Procedure: COLONOSCOPY WITH PROPOFOL;  Surgeon: Lollie Sails, MD;  Location: [facility] ENDOSCOPY;  Service: Endoscopy;  Laterality: N/A;  . CORONARY ATHERECTOMY N/A 02/03/2019   Procedure: CORONARY ATHERECTOMY;  Surgeon: Nelva Bush, MD;  Location: [place] CV LAB;  Service: Cardiovascular;  Laterality: N/A;  . CORONARY STENT INTERVENTION N/A 01/21/2019   Procedure: CORONARY  STENT INTERVENTION;  Surgeon: Nelva Bush, MD;  Location: [place] CV LAB;  Service: Cardiovascular;  Laterality: N/A;  . CORONARY STENT INTERVENTION N/A 02/03/2019   Procedure: CORONARY STENT INTERVENTION;  Surgeon: Nelva Bush, MD;  Location: [place] CV LAB;  Service: Cardiovascular;  Laterality: N/A;  . CYSTOSCOPY WITH URETHRAL DILATATION N/A 08/16/2018   Procedure: CYSTOSCOPY WITH URETHRAL DILATATION;  Surgeon: [name], MD;  Location: [facility] ORS;  Service: Urology;  Laterality: N/A;  . ESOPHAGOGASTRODUODENOSCOPY N/A 01/29/2017   Procedure: ESOPHAGOGASTRODUODENOSCOPY (EGD);  Surgeon: Lollie Sails, MD;  Location: [facility] ENDOSCOPY;  Service: Endoscopy;  Laterality: N/A;  . ESOPHAGOGASTRODUODENOSCOPY (EGD) WITH PROPOFOL N/A 07/10/2016   Procedure: ESOPHAGOGASTRODUODENOSCOPY (EGD) WITH PROPOFOL;  Surgeon: Lollie Sails, MD;  Location: [facility] ENDOSCOPY;  Service: Endoscopy;  Laterality: N/A;  . ESOPHAGOGASTRODUODENOSCOPY (EGD) WITH PROPOFOL N/A 12/19/2018   Procedure: ESOPHAGOGASTRODUODENOSCOPY (EGD) WITH PROPOFOL;  Surgeon: Lollie Sails, MD;  Location: [facility] ENDOSCOPY;  Service: Endoscopy;  Laterality: N/A;  . gastritis    . h.pylori    . INTRAVASCULAR ULTRASOUND/IVUS N/A 02/03/2019   Procedure: Intravascular Ultrasound/IVUS;  Surgeon: Nelva Bush, MD;  Location: [place] CV LAB;  Service: Cardiovascular;  Laterality: N/A;  . left elbow repair Left   . LEFT HEART CATH AND CORONARY ANGIOGRAPHY Left 01/21/2019   Procedure: LEFT HEART CATH AND CORONARY ANGIOGRAPHY;  Surgeon: Nelva Bush, MD;  Location: [place] CV LAB;  Service: Cardiovascular;  Laterality: Left;  . SPLENECTOMY  4  . TONSILLECTOMY      Allergies  No Known Allergies  History of Present Illness    73 year old male with the above past medical history including hypertension, Barrett's esophagus, gout, BPH, gastritis, and colon polyps.  He has a history of chest pain that dates back  several years with 2 emergency department visits in the past.  In July 2019, he was evaluated in the [facility] emergency department with left-sided chest pain that radiated to his arm.  Symptoms were accompanied by nausea and dyspnea.  ER evaluation was unremarkable.  He was referred to Dr. Saunders Revel, who saw him on October 10 and he subsequently underwent exercise treadmill testing in November 2019, showing good exercise tolerance, but was notable for 1 mm horizontal inferolateral ST segment depression as well as frequent PVCs.  In the setting of this intermediate study, recommendation was made for cardiac CT angiography.  This was finally performed January 13 and showed significant mid LAD disease with a fractional flow reserve of 0.6.    He subsequently underwent diagnostic catheterization revealing severe, calcified LAD disease as well as circumflex disease.  The circumflex was successfully treated with a drug-eluting stent and approximately 2 weeks later, he underwent orbital atherectomy of the LAD with successful placement of a drug-eluting stent.    Since his PCI and discharge on February 25, he has been doing exceptionally well.  He denies chest pain or dyspnea and has been walking some without significant limitations.  He says he definitely notices a difference in his activity tolerance following stenting.  He is looking forward to returning to backpacking/hiking as well as getting into the gym.  He visited cardiac rehabilitation for orientation yesterday and is looking forward to participating.  He denies palpitations, pnd, orthopnea, n, v, dizziness, syncope, edema, weight gain, or early satiety.   Home Medications    Prior to Admission medications   Medication Sig Start Date End Date Taking? Authorizing Provider  amLODipine (NORVASC) 10 MG tablet Take 1 tablet (10 mg total) by mouth daily. 06/26/18   [name], MD  aspirin EC 81 MG tablet Take 81 mg by mouth daily.    [provider]    atorvastatin (LIPITOR) 40 MG tablet Take 1 tablet (40 mg total) by mouth daily. 12/30/18   [name], MD  Bioflavonoid Products (BIOFLEX PO) Take 1 tablet by mouth daily.     [provider]  clopidogrel (PLAVIX) 75 MG tablet Take 1 tablet (75 mg total) by mouth daily with breakfast. 01/23/19   Marrianne Mood D, PA-C  febuxostat (ULORIC) 40 MG tablet Take 1 tablet (40 mg total) by mouth daily. 06/26/18   [name], MD  FLUoxetine (PROZAC) 20 MG capsule Take 1 capsule (20 mg total) by mouth daily. 12/30/18   [name], MD  furosemide (LASIX) 20 MG tablet Take 1 tablet (20 mg total) by mouth daily for 30 days. 01/22/19 02/21/19  Marrianne Mood D, PA-C  LORazepam (ATIVAN) 1 MG tablet Take 0.5-1 tablets (0.5-1 mg total) by mouth daily as needed for anxiety. 12/30/18   [name], MD  losartan (COZAAR) 100 MG tablet Take 1 tablet (100 mg total) by mouth daily. 11/18/18   [name], MD  metoprolol tartrate (LOPRESSOR) 25 MG tablet Take 1 tablet (25 mg total) by mouth 2 (two) times daily. 01/22/19 01/22/20  Marrianne Mood D, PA-C  Multiple Vitamin (MULTIVITAMIN) tablet Take 1 tablet by mouth daily.    [provider]  Multiple Vitamins-Minerals (EMERGEN-C IMMUNE PO) Take 1 tablet by mouth every other day.     [provider]  pantoprazole (PROTONIX) 20 MG tablet Take 1 tablet (20 mg total) by mouth 2 (two) times daily. 06/26/18   [name], MD  potassium chloride (K-DUR) 10 MEQ tablet Take 1 tablet (10 mEq total) by mouth daily. 01/22/19   Marrianne Mood D, PA-C    Review of Systems    He denies chest pain, palpitations, dyspnea, pnd, orthopnea, n, v, dizziness, syncope, edema, weight gain, or early satiety. All other systems reviewed and are otherwise negative except as noted above.  Physical Exam    VS:  BP 138/80 (BP Location: Left Arm, Patient Position: Sitting, Cuff Size: Normal)   Pulse 68   Ht 5\' 11"  (1.803 m)   Wt 251 lb  (113.9 kg)   BMI 35.01 kg/m  , BMI Body mass index is 35.01 kg/m. GEN: Well nourished, well developed, in no acute distress. HEENT: normal. Neck: Supple, no JVD, carotid bruits, or masses. Cardiac: RRR, no murmurs, rubs, or gallops. No clubbing, cyanosis, edema.  Radials/PT 2+ and equal bilaterally.  Bilat radial cath sites w/o bleeding/bruit/hematoma. Respiratory:  Respirations regular and unlabored, clear to auscultation bilaterally. GI: Soft, nontender, nondistended, BS + x 4. MS: no deformity or atrophy. Skin: warm and dry, no rash. Neuro:  Strength and sensation are intact. Psych: Normal affect.  Accessory Clinical Findings    ECG personally reviewed by me today - RSR, 70 - no acute changes.  Lab Results  Component Value Date   CHOL 124 02/12/2019   HDL 59 06/26/2018   LDLCALC 98 06/26/2018   TRIG 58 02/12/2019   CHOLHDL 2.9 06/26/2018     Assessment & Plan    1.  CAD: Status post recent PCI and drug-eluting stent placement to the left circumflex followed by atherectomy and drug-eluting stent placement to the LAD in February.  Patient is done exceptionally well since his procedures and has not been experiencing any chest pain or dyspnea.  He is looking forward to outdoor activities later in the year but recognizes that he must first complete cardiac rehab which he does plan to enroll in and has already completed orientation.  He remains on aspirin, statin, Plavix, beta-blocker, ARB, and calcium channel blocker therapy.  2.  Essential hypertension: Blood pressure mildly elevated today at 138/80.  Prior recordings show more normal pressures.  He does have a cuff at home but does not use it.  I recommended that he replaced the batteries and check pressures a few times a week and contact us if pressures are consistently over 885 systolic.  3.  Hyperlipidemia: He remains on statin therapy and is due for follow-up lipids and LFTs later this month.  LDL was 98 in July 2019.  4.   Barrett's esophagus/GERD: Stable on PPI therapy.  5.  Morbid obesity: He does plan to enroll in cardiac rehabilitation and is looking forward to exercising and losing weight.  6.  HFrEF: Normal LV function by left ventriculography at the time of diagnostic catheterization the LVEDP was elevated at that time and he was placed on Lasix.  Euvolemic on exam today.  Heart rate stable blood pressure mildly elevated.  He will continue to follow blood pressure.  I will refill his Lasix today.    7.  Disposition: Follow-up with Dr. Saunders Revel in 3 months or sooner if necessary.  I, Margit Banda am acting as a Education administrator for  Murray Hodgkins, NP.  I have reviewed the above documentation for accuracy and completeness, and I agree with the above.   Murray Hodgkins, NP 02/14/2019, 6:03 PM

## 2019-02-13 NOTE — Progress Notes (Signed)
Daily Session Note  Patient Details  Name: Jesse Norton MRN: [id number] Date of Birth: [birth year] Referring Provider:     Cardiac Rehab from 02/13/2019 in [facility]  Referring Provider  End      Encounter Date: 02/13/2019  Check In: Session Check In - 02/13/19 [place]      Check-In   Supervising physician immediately available to respond to emergencies  See telemetry face sheet for immediately available ER MD    Location  [facility]-Cardiac & Pulmonary Rehab    Staff Present  Renita Papa, RN Vickki Hearing, BA, ACSM CEP, Exercise Physiologist    Medication changes reported      No    Fall or balance concerns reported     No    Warm-up and Cool-down  Not performed (comment)   Med Review completed   Resistance Training Performed  Yes    VAD Patient?  No    PAD/SET Patient?  No      Pain Assessment   Currently in Pain?  No/denies        Exercise Prescription Changes - 02/13/19 1400      Response to Exercise   Blood Pressure (Admit)  132/68    Blood Pressure (Exercise)  142/70    Blood Pressure (Exit)  132/76    Heart Rate (Admit)  63 bpm    Heart Rate (Exercise)  107 bpm    Heart Rate (Exit)  53 bpm    Oxygen Saturation (Admit)  99 %    Oxygen Saturation (Exit)  98 %    Rating of Perceived Exertion (Exercise)  10       Social History   Tobacco Use  Smoking Status Never Smoker  Smokeless Tobacco Never Used    Goals Met:  Proper associated with RPD/PD & O2 Sat Exercise tolerated well Personal goals reviewed No report of cardiac concerns or symptoms Strength training completed today  Goals Unmet:  Not Applicable  Comments: Med Review completed   Dr. Emily Filbert is Medical Director for [place] [facility].

## 2019-02-13 NOTE — Patient Instructions (Signed)
Patient Instructions  Patient Details  Name: Jesse Norton MRN: [id number] Date of Birth: [birth year] Referring Provider:  Nelva Bush, MD  Below are your personal goals for exercise, nutrition, and risk factors. Our goal is to help you stay on track towards obtaining and maintaining these goals. We will be discussing your progress on these goals with you throughout the program.  Initial Exercise Prescription: Initial Exercise Prescription - 02/13/19 1400      Date of Initial Exercise RX and Referring Provider   Date  02/13/19    Referring Provider  End      Treadmill   MPH  2.3    Grade  0    Minutes  15    METs  2.7      Recumbant Bike   Level  5    RPM  60    Watts  25    Minutes  15    METs  2.7      Elliptical   Level  1    Speed  2.6    Minutes  15      Prescription Details   Frequency (times per week)  3    Duration  Progress to 45 minutes of aerobic exercise without signs/symptoms of physical distress      Intensity   THRR 40-80% of Max Heartrate  94-129    Ratings of Perceived Exertion  11-13    Perceived Dyspnea  0-4      Resistance Training   Training Prescription  Yes    Weight  4 lb    Reps  10-15       Exercise Goals: Frequency: Be able to perform aerobic exercise two to three times per week in program working toward 2-5 days per week of home exercise.  Intensity: Work with a perceived exertion of 11 (fairly light) - 15 (hard) while following your exercise prescription.  We will make changes to your prescription with you as you progress through the program.   Duration: Be able to do 30 to 45 minutes of continuous aerobic exercise in addition to a 5 minute warm-up and a 5 minute cool-down routine.   Nutrition Goals: Your personal nutrition goals will be established when you do your nutrition analysis with the dietician.  The following are general nutrition guidelines to follow: Cholesterol < 200mg /day Sodium < 1500mg /day Fiber: Men  over 50 yrs - 30 grams per day  Personal Goals: Personal Goals and Risk Factors at Admission - 02/13/19 1406      Core Components/Risk Factors/Patient Goals on Admission    Weight Management  Yes;Weight Loss    Intervention  Weight Management: Develop a combined nutrition and exercise program designed to reach desired caloric intake, while maintaining appropriate intake of nutrient and fiber, sodium and fats, and appropriate energy expenditure required for the weight goal.;Weight Management: Provide education and appropriate resources to help participant work on and attain dietary goals.;Weight Management/Obesity: Establish reasonable short term and long term weight goals.    Admit Weight  252 lb (114.3 kg)    Goal Weight: Short Term  248 lb (112.5 kg)    Goal Weight: Long Term  215 lb (97.5 kg)    Expected Outcomes  Short Term: Continue to assess and modify interventions until short term weight is achieved;Long Term: Adherence to nutrition and physical activity/exercise program aimed toward attainment of established weight goal;Weight Loss: Understanding of general recommendations for a balanced deficit meal plan, which promotes 1-2 lb weight loss per  week and includes a negative energy balance of [phone] kcal/d;Understanding recommendations for meals to include 15-35% energy as protein, 25-35% energy from fat, 35-60% energy from carbohydrates, less than 200mg  of dietary cholesterol, 20-35 gm of total fiber daily;Understanding of distribution of calorie intake throughout the day with the consumption of 4-5 meals/snacks    Hypertension  Yes    Intervention  Provide education on lifestyle modifcations including regular physical activity/exercise, weight management, moderate sodium restriction and increased consumption of fresh fruit, vegetables, and low fat dairy, alcohol moderation, and smoking cessation.;Monitor prescription use compliance.    Expected Outcomes  Short Term: Continued assessment and  intervention until BP is < 140/33mm HG in hypertensive participants. < 130/48mm HG in hypertensive participants with diabetes, heart failure or chronic kidney disease.;Long Term: Maintenance of blood pressure at goal levels.    Lipids  Yes    Intervention  Provide education and support for participant on nutrition & aerobic/resistive exercise along with prescribed medications to achieve LDL 70mg , HDL >40mg .    Expected Outcomes  Short Term: Participant states understanding of desired cholesterol values and is compliant with medications prescribed. Participant is following exercise prescription and nutrition guidelines.;Long Term: Cholesterol controlled with medications as prescribed, with individualized exercise RX and with personalized nutrition plan. Value goals: LDL < 70mg , HDL > 40 mg.       Tobacco Use Initial Evaluation: Social History   Tobacco Use  Smoking Status Never Smoker  Smokeless Tobacco Never Used    Exercise Goals and Review: Exercise Goals    Row Name 02/13/19 1434             Exercise Goals   Increase Physical Activity  Yes       Intervention  Provide advice, education, support and counseling about physical activity/exercise needs.;Develop an individualized exercise prescription for aerobic and resistive training based on initial evaluation findings, risk stratification, comorbidities and participant's personal goals.       Expected Outcomes  Short Term: Attend rehab on a regular basis to increase amount of physical activity.;Long Term: Add in home exercise to make exercise part of routine and to increase amount of physical activity.;Long Term: Exercising regularly at least 3-5 days a week.       Increase Strength and Stamina  Yes       Intervention  Provide advice, education, support and counseling about physical activity/exercise needs.;Develop an individualized exercise prescription for aerobic and resistive training based on initial evaluation findings, risk  stratification, comorbidities and participant's personal goals.       Expected Outcomes  Short Term: Increase workloads from initial exercise prescription for resistance, speed, and METs.;Short Term: Perform resistance training exercises routinely during rehab and add in resistance training at home;Long Term: Improve cardiorespiratory fitness, muscular endurance and strength as measured by increased METs and functional capacity (6MWT)       Able to understand and use rate of perceived exertion (RPE) scale  Yes       Intervention  Provide education and explanation on how to use RPE scale       Expected Outcomes  Short Term: Able to use RPE daily in rehab to express subjective intensity level;Long Term:  Able to use RPE to guide intensity level when exercising independently       Able to understand and use Dyspnea scale  Yes       Intervention  Provide education and explanation on how to use Dyspnea scale       Expected Outcomes  Short Term: Able to use Dyspnea scale daily in rehab to express subjective sense of shortness of breath during exertion;Long Term: Able to use Dyspnea scale to guide intensity level when exercising independently       Knowledge and understanding of Target Heart Rate Range (THRR)  Yes       Intervention  Provide education and explanation of THRR including how the numbers were predicted and where they are located for reference       Expected Outcomes  Short Term: Able to state/look up THRR;Short Term: Able to use daily as guideline for intensity in rehab;Long Term: Able to use THRR to govern intensity when exercising independently       Able to check pulse independently  Yes       Intervention  Provide education and demonstration on how to check pulse in carotid and radial arteries.;Review the importance of being able to check your own pulse for safety during independent exercise       Expected Outcomes  Short Term: Able to explain why pulse checking is important during independent  exercise;Long Term: Able to check pulse independently and accurately       Understanding of Exercise Prescription  Yes       Intervention  Provide education, explanation, and written materials on patient's individual exercise prescription       Expected Outcomes  Short Term: Able to explain program exercise prescription;Long Term: Able to explain home exercise prescription to exercise independently          Copy of goals given to participant.

## 2019-02-14 ENCOUNTER — Ambulatory Visit (INDEPENDENT_AMBULATORY_CARE_PROVIDER_SITE_OTHER): Payer: Medicare Other | Admitting: Nurse Practitioner

## 2019-02-14 ENCOUNTER — Encounter: Payer: Self-pay | Admitting: Nurse Practitioner

## 2019-02-14 VITALS — BP 138/80 | HR 68 | Ht 71.0 in | Wt 251.0 lb

## 2019-02-14 DIAGNOSIS — I5032 Chronic diastolic (congestive) heart failure: Secondary | ICD-10-CM

## 2019-02-14 DIAGNOSIS — E785 Hyperlipidemia, unspecified: Secondary | ICD-10-CM

## 2019-02-14 DIAGNOSIS — I10 Essential (primary) hypertension: Secondary | ICD-10-CM | POA: Diagnosis not present

## 2019-02-14 DIAGNOSIS — I2583 Coronary atherosclerosis due to lipid rich plaque: Secondary | ICD-10-CM | POA: Diagnosis not present

## 2019-02-14 DIAGNOSIS — I2510 Atherosclerotic heart disease of native coronary artery without angina pectoris: Secondary | ICD-10-CM

## 2019-02-14 MED ORDER — FUROSEMIDE 20 MG PO TABS: 20.0000 mg | ORAL_TABLET | Freq: Every day | ORAL | 3 refills | Status: DC

## 2019-02-14 NOTE — Patient Instructions (Signed)
Medication Instructions:  Your physician recommends that you continue on your current medications as directed. Please refer to the Current Medication list given to you today.  If you need a refill on your cardiac medications before your next appointment, please call your pharmacy.   Lab work: None ordered  If you have labs (blood work) drawn today and your tests are completely normal, you will receive your results only by: Marland Kitchen MyChart Message (if you have MyChart) OR . A paper copy in the mail If you have any lab test that is abnormal or we need to change your treatment, we will call you to review the results.  Testing/Procedures: None ordered   Follow-Up: At [facility], you and your health needs are our priority.  As part of our continuing mission to provide you with exceptional heart care, we have created designated Provider Care Teams.  These Care Teams include your primary Cardiologist (physician) and Advanced Practice Providers (APPs -  Physician Assistants and Nurse Practitioners) who all work together to provide you with the care you need, when you need it. You will need a follow up appointment in 3 months. Please see Nelva Bush, MD.

## 2019-02-17 ENCOUNTER — Ambulatory Visit (INDEPENDENT_AMBULATORY_CARE_PROVIDER_SITE_OTHER): Payer: Medicare Other | Admitting: Family Medicine

## 2019-02-17 ENCOUNTER — Encounter: Payer: Self-pay | Admitting: Family Medicine

## 2019-02-17 VITALS — BP 125/79 | HR 75 | Temp 98.5°F | Ht 69.0 in | Wt 251.7 lb

## 2019-02-17 DIAGNOSIS — J111 Influenza due to unidentified influenza virus with other respiratory manifestations: Secondary | ICD-10-CM | POA: Diagnosis not present

## 2019-02-17 DIAGNOSIS — I2583 Coronary atherosclerosis due to lipid rich plaque: Secondary | ICD-10-CM

## 2019-02-17 DIAGNOSIS — I2510 Atherosclerotic heart disease of native coronary artery without angina pectoris: Secondary | ICD-10-CM

## 2019-02-17 DIAGNOSIS — R6883 Chills (without fever): Secondary | ICD-10-CM | POA: Diagnosis not present

## 2019-02-17 LAB — VERITOR FLU A/B WAIVED
Influenza A: NEGATIVE
Influenza B: NEGATIVE

## 2019-02-17 MED ORDER — BENZONATATE 200 MG PO CAPS: 200.0000 mg | ORAL_CAPSULE | Freq: Three times a day (TID) | ORAL | 0 refills | Status: DC | PRN

## 2019-02-17 MED ORDER — OSELTAMIVIR PHOSPHATE 75 MG PO CAPS: 75.0000 mg | ORAL_CAPSULE | Freq: Two times a day (BID) | ORAL | 0 refills | Status: DC

## 2019-02-17 MED ORDER — HYDROCOD POLST-CPM POLST ER 10-8 MG/5ML PO SUER: 5.0000 mL | Freq: Every evening | ORAL | 0 refills | Status: DC | PRN

## 2019-02-17 NOTE — Patient Instructions (Signed)
Coricidin HBP products for cold and flu symptoms  Plain mucinex twice daily as needed  Delsym cough syrup as needed

## 2019-02-17 NOTE — Progress Notes (Signed)
BP 125/79 (BP Location: Right Arm, Patient Position: Sitting, Cuff Size: Normal)   Pulse 75   Temp 98.5 F (36.9 C) (Oral)   Ht 5\' 9"  (1.753 m)   Wt 251 lb 11.2 oz (114.2 kg)   SpO2 95%   BMI 37.17 kg/m    Subjective:    Patient ID: Jesse Norton, male    DOB: 1946-02-02, 73 y.o.   MRN: [id number]  HPI: YOHANN CURL is a 73 y.o. male  Chief Complaint  Patient presents with  . Sinusitis    Ongoing 3 days. Onset symptoms.   . Chills  . Headache  . Cough  . Nausea   Sudden onset cough, congestion, severe headache, body aches, sweats, chills, nausea x 3 days. No vomiting, ear pain, sore throat, Cp, SOB. Taking aspirin with minimal relief. Several sick contacts recently. Non smoker and no hx of pulmonary dz.   Relevant past medical, surgical, family and social history reviewed and updated as indicated. Interim medical history since our last visit reviewed. Allergies and medications reviewed and updated.  Review of Systems  Per HPI unless specifically indicated above     Objective:    BP 125/79 (BP Location: Right Arm, Patient Position: Sitting, Cuff Size: Normal)   Pulse 75   Temp 98.5 F (36.9 C) (Oral)   Ht 5\' 9"  (1.753 m)   Wt 251 lb 11.2 oz (114.2 kg)   SpO2 95%   BMI 37.17 kg/m   Wt Readings from Last 3 Encounters:  02/17/19 251 lb 11.2 oz (114.2 kg)  02/14/19 251 lb (113.9 kg)  02/13/19 252 lb 3.2 oz (114.4 kg)    Physical Exam Vitals signs and nursing note reviewed.  Constitutional:      Appearance: He is well-developed.  HENT:     Head: Atraumatic.     Right Ear: External ear normal.     Left Ear: External ear normal.     Nose: Rhinorrhea present.     Mouth/Throat:     Pharynx: Posterior oropharyngeal erythema present. No oropharyngeal exudate.  Eyes:     Conjunctiva/sclera: Conjunctivae normal.     Pupils: Pupils are equal, round, and reactive to light.  Neck:     Musculoskeletal: Normal range of motion and neck supple.  Cardiovascular:      Rate and Rhythm: Normal rate and regular rhythm.  Pulmonary:     Effort: Pulmonary effort is normal. No respiratory distress.     Breath sounds: No wheezing or rales.  Musculoskeletal: Normal range of motion.  Lymphadenopathy:     Cervical: No cervical adenopathy.  Skin:    General: Skin is warm and dry.  Neurological:     Mental Status: He is alert and oriented to person, place, and time.  Psychiatric:        Behavior: Behavior normal.     Results for orders placed or performed in visit on 02/17/19  Veritor Flu A/B Waived  Result Value Ref Range   Influenza A Negative Negative   Influenza B Negative Negative      Assessment & Plan:   Problem List Items Addressed This Visit    None    Visit Diagnoses    Influenza    -  Primary   Rapid flu neg, but sxs consistent. Tx with tamiflu, tussionex, mucinex, supportive care. Sedation and return precautions reviewed   Relevant Medications   oseltamivir (TAMIFLU) 75 MG capsule   Other Relevant Orders   Veritor Flu A/B Waived (  Completed)       Follow up plan: Return if symptoms worsen or fail to improve.

## 2019-02-26 ENCOUNTER — Ambulatory Visit: Payer: Medicare Other

## 2019-02-27 ENCOUNTER — Ambulatory Visit: Payer: Medicare Other

## 2019-03-03 ENCOUNTER — Ambulatory Visit: Payer: Medicare Other

## 2019-03-05 ENCOUNTER — Ambulatory Visit: Payer: Medicare Other

## 2019-03-05 ENCOUNTER — Encounter: Payer: Self-pay | Admitting: *Deleted

## 2019-03-05 DIAGNOSIS — Z955 Presence of coronary angioplasty implant and graft: Secondary | ICD-10-CM

## 2019-03-05 NOTE — Progress Notes (Signed)
Cardiac Individual Treatment Plan  Patient Details  Name: Jesse Norton MRN: [id number] Date of Birth: May 02, 1946 Referring Provider:     Cardiac Rehab from 02/13/2019 in [facility] Cardiac and Pulmonary Rehab  Referring Provider  End      Initial Encounter Date:    Cardiac Rehab from 02/13/2019 in [facility] Cardiac and Pulmonary Rehab  Date  02/13/19      Visit Diagnosis: Status post coronary artery stent placement  Patient's Home Medications on Admission:  Current Outpatient Medications:  .  amLODipine (NORVASC) 10 MG tablet, Take 1 tablet (10 mg total) by mouth daily., Disp: 90 tablet, Rfl: 4 .  aspirin EC 81 MG tablet, Take 81 mg by mouth daily., Disp: , Rfl:  .  atorvastatin (LIPITOR) 40 MG tablet, Take 1 tablet (40 mg total) by mouth daily., Disp: 90 tablet, Rfl: 2 .  benzonatate (TESSALON) 200 MG capsule, Take 1 capsule (200 mg total) by mouth 3 (three) times daily as needed., Disp: 30 capsule, Rfl: 0 .  Bioflavonoid Products (BIOFLEX PO), Take 1 tablet by mouth daily. , Disp: , Rfl:  .  chlorpheniramine-HYDROcodone (TUSSIONEX PENNKINETIC ER) 10-8 MG/5ML SUER, Take 5 mLs by mouth at bedtime as needed., Disp: 50 mL, Rfl: 0 .  clopidogrel (PLAVIX) 75 MG tablet, Take 1 tablet (75 mg total) by mouth daily with breakfast., Disp: 90 tablet, Rfl: 3 .  febuxostat (ULORIC) 40 MG tablet, Take 1 tablet (40 mg total) by mouth daily., Disp: 90 tablet, Rfl: 4 .  FLUoxetine (PROZAC) 20 MG capsule, Take 1 capsule (20 mg total) by mouth daily., Disp: 90 capsule, Rfl: 3 .  furosemide (LASIX) 20 MG tablet, Take 1 tablet (20 mg total) by mouth daily for 30 days., Disp: 90 tablet, Rfl: 3 .  LORazepam (ATIVAN) 1 MG tablet, Take 0.5-1 tablets (0.5-1 mg total) by mouth daily as needed for anxiety., Disp: 30 tablet, Rfl: 1 .  losartan (COZAAR) 100 MG tablet, Take 1 tablet (100 mg total) by mouth daily., Disp: 90 tablet, Rfl: 3 .  metoprolol tartrate (LOPRESSOR) 25 MG tablet, Take 1 tablet (25 mg total) by mouth 2  (two) times daily., Disp: 180 tablet, Rfl: 3 .  Multiple Vitamin (MULTIVITAMIN) tablet, Take 1 tablet by mouth daily., Disp: , Rfl:  .  Multiple Vitamins-Minerals (EMERGEN-C IMMUNE PO), Take 1 tablet by mouth every other day. , Disp: , Rfl:  .  oseltamivir (TAMIFLU) 75 MG capsule, Take 1 capsule (75 mg total) by mouth 2 (two) times daily., Disp: 10 capsule, Rfl: 0 .  pantoprazole (PROTONIX) 20 MG tablet, Take 1 tablet (20 mg total) by mouth 2 (two) times daily., Disp: 180 tablet, Rfl: 4 .  potassium chloride (K-DUR) 10 MEQ tablet, Take 1 tablet (10 mEq total) by mouth daily. (Patient not taking: Reported on 02/17/2019), Disp: 30 tablet, Rfl: 0  Past Medical History: Past Medical History:  Diagnosis Date  . (HFpEF) heart failure with preserved ejection fraction ([place])    a. 01/2019 Cath: EF 55-60% w/ elevated LVEDP.  Marland Kitchen Anxiety   . Barrett's esophagus   . CAD (coronary artery disease)    a. 10/2018 ETT: 61m horizontal inflat ST depression, freq PVC's->Intermediate; b. 12/2018 Cardiac CTA: LM nl, LAD FFRct 0.6 in mLAD, LCX FFRct 0.99p, 0.84m0.77d, RCA no signif dzs, FFRct 0.89d->Rec cath; c. 01/2019 PCI: LM nl, LAD 40p, 8547m2 70, LCX 70p (2.75x15 Resolute Onyx DES), RCA min irregs. EF 55-65%. EDP 25-35m51m d. 01/2019 PCI: LAD 80-90 (atherectomy & 3x15 Resolute  Onyx DES).  . Colon polyps   . Duodenal adenoma   . Gastritis   . GERD (gastroesophageal reflux disease)   . Gout   . H. pylori infection   . H/O urethral stricture   . Hemorrhoids   . Hiatal hernia   . Hypertension   . Stomach ulcer     Tobacco Use: Social History   Tobacco Use  Smoking Status Never Smoker  Smokeless Tobacco Never Used    Labs: Recent Review Flowsheet Data    Labs for ITP Cardiac and Pulmonary Rehab Latest Ref Rng & Units 05/25/2015 05/30/2016 06/07/2017 06/26/2018 02/12/2019   Cholestrol <200 mg/dL 186 181 166 169 124   LDLCALC 0 - 99 mg/dL 109(H) 107(H) 85 98 -   HDL >39 mg/dL 62 62 56 59 -   Trlycerides <150  mg/dL 75 61 127 62 58       Exercise Target Goals: Exercise Program Goal: Individual exercise prescription set using results from initial 6 min walk test and THRR while considering  patient's activity barriers and safety.   Exercise Prescription Goal: Initial exercise prescription builds to 30-45 minutes a day of aerobic activity, 2-3 days per week.  Home exercise guidelines will be given to patient during program as part of exercise prescription that the participant will acknowledge.  Activity Barriers & Risk Stratification: Activity Barriers & Cardiac Risk Stratification - 02/13/19 1432      Activity Barriers & Cardiac Risk Stratification   Activity Barriers  Back Problems    Cardiac Risk Stratification  Moderate       6 Minute Walk: 6 Minute Walk    Row Name 02/13/19 1435         6 Minute Walk   Phase  Initial     Distance  1200 feet     Walk Time  6 minutes     # of Rest Breaks  0     MPH  2.3     METS  2.08     RPE  10     VO2 Peak  7.3     Symptoms  No     Resting HR  60 bpm     Resting BP  132/68     Resting Oxygen Saturation   99 %     Exercise Oxygen Saturation  during 6 min walk  98 %     Max Ex. HR  107 bpm     Max Ex. BP  142/70     2 Minute Post BP  132/76        Oxygen Initial Assessment:   Oxygen Re-Evaluation:   Oxygen Discharge (Final Oxygen Re-Evaluation):   Initial Exercise Prescription: Initial Exercise Prescription - 02/13/19 1400      Date of Initial Exercise RX and Referring Provider   Date  02/13/19    Referring Provider  End      Treadmill   MPH  2.3    Grade  0    Minutes  15    METs  2.7      Recumbant Bike   Level  5    RPM  60    Watts  25    Minutes  15    METs  2.7      Elliptical   Level  1    Speed  2.6    Minutes  15      Prescription Details   Frequency (times per week)  3    Duration  Progress to 45 minutes of aerobic exercise without signs/symptoms of physical distress      Intensity   THRR  40-80% of Max Heartrate  94-129    Ratings of Perceived Exertion  11-13    Perceived Dyspnea  0-4      Resistance Training   Training Prescription  Yes    Weight  4 lb    Reps  10-15       Perform Capillary Blood Glucose checks as needed.  Exercise Prescription Changes: Exercise Prescription Changes    Row Name 02/13/19 1400             Response to Exercise   Blood Pressure (Admit)  132/68       Blood Pressure (Exercise)  142/70       Blood Pressure (Exit)  132/76       Heart Rate (Admit)  63 bpm       Heart Rate (Exercise)  107 bpm       Heart Rate (Exit)  53 bpm       Oxygen Saturation (Admit)  99 %       Oxygen Saturation (Exit)  98 %       Rating of Perceived Exertion (Exercise)  10          Exercise Comments:   Exercise Goals and Review: Exercise Goals    Row Name 02/13/19 1434             Exercise Goals   Increase Physical Activity  Yes       Intervention  Provide advice, education, support and counseling about physical activity/exercise needs.;Develop an individualized exercise prescription for aerobic and resistive training based on initial evaluation findings, risk stratification, comorbidities and participant's personal goals.       Expected Outcomes  Short Term: Attend rehab on a regular basis to increase amount of physical activity.;Long Term: Add in home exercise to make exercise part of routine and to increase amount of physical activity.;Long Term: Exercising regularly at least 3-5 days a week.       Increase Strength and Stamina  Yes       Intervention  Provide advice, education, support and counseling about physical activity/exercise needs.;Develop an individualized exercise prescription for aerobic and resistive training based on initial evaluation findings, risk stratification, comorbidities and participant's personal goals.       Expected Outcomes  Short Term: Increase workloads from initial exercise prescription for resistance, speed, and  METs.;Short Term: Perform resistance training exercises routinely during rehab and add in resistance training at home;Long Term: Improve cardiorespiratory fitness, muscular endurance and strength as measured by increased METs and functional capacity (6MWT)       Able to understand and use rate of perceived exertion (RPE) scale  Yes       Intervention  Provide education and explanation on how to use RPE scale       Expected Outcomes  Short Term: Able to use RPE daily in rehab to express subjective intensity level;Long Term:  Able to use RPE to guide intensity level when exercising independently       Able to understand and use Dyspnea scale  Yes       Intervention  Provide education and explanation on how to use Dyspnea scale       Expected Outcomes  Short Term: Able to use Dyspnea scale daily in rehab to express subjective sense of shortness of breath during exertion;Long Term: Able to use Dyspnea scale to guide intensity  level when exercising independently       Knowledge and understanding of Target Heart Rate Range (THRR)  Yes       Intervention  Provide education and explanation of THRR including how the numbers were predicted and where they are located for reference       Expected Outcomes  Short Term: Able to state/look up THRR;Short Term: Able to use daily as guideline for intensity in rehab;Long Term: Able to use THRR to govern intensity when exercising independently       Able to check pulse independently  Yes       Intervention  Provide education and demonstration on how to check pulse in carotid and radial arteries.;Review the importance of being able to check your own pulse for safety during independent exercise       Expected Outcomes  Short Term: Able to explain why pulse checking is important during independent exercise;Long Term: Able to check pulse independently and accurately       Understanding of Exercise Prescription  Yes       Intervention  Provide education, explanation, and  written materials on patient's individual exercise prescription       Expected Outcomes  Short Term: Able to explain program exercise prescription;Long Term: Able to explain home exercise prescription to exercise independently          Exercise Goals Re-Evaluation :   Discharge Exercise Prescription (Final Exercise Prescription Changes): Exercise Prescription Changes - 02/13/19 1400      Response to Exercise   Blood Pressure (Admit)  132/68    Blood Pressure (Exercise)  142/70    Blood Pressure (Exit)  132/76    Heart Rate (Admit)  63 bpm    Heart Rate (Exercise)  107 bpm    Heart Rate (Exit)  53 bpm    Oxygen Saturation (Admit)  99 %    Oxygen Saturation (Exit)  98 %    Rating of Perceived Exertion (Exercise)  10       Nutrition:  Target Goals: Understanding of nutrition guidelines, daily intake of sodium '1500mg'$ , cholesterol '200mg'$ , calories 30% from fat and 7% or less from saturated fats, daily to have 5 or more servings of fruits and vegetables.  Biometrics: Pre Biometrics - 02/13/19 1433      Pre Biometrics   Height  5' 8.9" (1.75 m)    Weight  252 lb 3.2 oz (114.4 kg)    Waist Circumference  48 inches    Hip Circumference  44.25 inches    Waist to Hip Ratio  1.08 %    BMI (Calculated)  37.35    Single Leg Stand  22.5 seconds        Nutrition Therapy Plan and Nutrition Goals: Nutrition Therapy & Goals - 02/13/19 1420      Intervention Plan   Intervention  Prescribe, educate and counsel regarding individualized specific dietary modifications aiming towards targeted core components such as weight, hypertension, lipid management, diabetes, heart failure and other comorbidities.;Nutrition handout(s) given to patient.    Expected Outcomes  Short Term Goal: Understand basic principles of dietary content, such as calories, fat, sodium, cholesterol and nutrients.;Short Term Goal: A plan has been developed with personal nutrition goals set during dietitian appointment.;Long  Term Goal: Adherence to prescribed nutrition plan.       Nutrition Assessments: Nutrition Assessments - 02/13/19 1420      MEDFICTS Scores   Pre Score  78       Nutrition Goals Re-Evaluation:  Nutrition Goals Discharge (Final Nutrition Goals Re-Evaluation):   Psychosocial: Target Goals: Acknowledge presence or absence of significant depression and/or stress, maximize coping skills, provide positive support system. Participant is able to verbalize types and ability to use techniques and skills needed for reducing stress and depression.   Initial Review & Psychosocial Screening: Initial Psych Review & Screening - 02/13/19 1411      Initial Review   Current issues with  Current Stress Concerns    Comments  Johnluke did not go into detail about his stressors during orientation, but did say it has gotten better since his MD put him on Prozac in January. One of his top three goals is to learn how to manage stress better      [place]?  Yes   spouse, son, friend     Barriers   Psychosocial barriers to participate in program  There are no identifiable barriers or psychosocial needs.;The patient should benefit from training in stress management and relaxation.      Screening Interventions   Interventions  Encouraged to exercise;Program counselor consult;Provide feedback about the scores to participant;To provide support and resources with identified psychosocial needs    Expected Outcomes  Short Term goal: Utilizing psychosocial counselor, staff and physician to assist with identification of specific Stressors or current issues interfering with healing process. Setting desired goal for each stressor or current issue identified.;Long Term Goal: Stressors or current issues are controlled or eliminated.;Short Term goal: Identification and review with participant of any Quality of Life or Depression concerns found by scoring the questionnaire.;Long Term goal: The  participant improves quality of Life and PHQ9 Scores as seen by post scores and/or verbalization of changes       Quality of Life Scores:  Quality of Life - 02/13/19 1420      Quality of Life   Select  Quality of Life      Quality of Life Scores   Health/Function Pre  25.27 %    Socioeconomic Pre  30 %    Psych/Spiritual Pre  25.79 %    Family Pre  25.6 %    GLOBAL Pre  26.4 %      Scores of 19 and below usually indicate a poorer quality of life in these areas.  A difference of  2-3 points is a clinically meaningful difference.  A difference of 2-3 points in the total score of the Quality of Life Index has been associated with significant improvement in overall quality of life, self-image, physical symptoms, and general health in studies assessing change in quality of life.  PHQ-9: Recent Review Flowsheet Data    Depression screen [employer] 2/9 02/13/2019 02/12/2019 05/23/2018 12/25/2017 05/18/2017   Decreased Interest 0 0 0 0 0   Down, Depressed, Hopeless 0 0 1 0 0   PHQ - 2 Score 0 0 1 0 0   Altered sleeping '1 1 1 '$ - 0   Tired, decreased energy 0 0 1 - 1   Change in appetite 0 0 0 - 0   Feeling bad or failure about yourself  0 0 0 - 0   Trouble concentrating 0 0 1 - 0   Moving slowly or fidgety/restless 0 0 1 - 0   Suicidal thoughts 0 0 0 - 0   PHQ-9 Score '1 1 5 '$ - 1   Difficult doing work/chores Not difficult at all Not difficult at all Not difficult at all - -  Interpretation of Total Score  Total Score Depression Severity:  1-4 = Minimal depression, 5-9 = Mild depression, 10-14 = Moderate depression, 15-19 = Moderately severe depression, 20-27 = Severe depression   Psychosocial Evaluation and Intervention:   Psychosocial Re-Evaluation:   Psychosocial Discharge (Final Psychosocial Re-Evaluation):   Vocational Rehabilitation: Provide vocational rehab assistance to qualifying candidates.   Vocational Rehab Evaluation & Intervention: Vocational Rehab - 02/13/19 1410       Initial Vocational Rehab Evaluation & Intervention   Assessment shows need for Vocational Rehabilitation  No       Education: Education Goals: Education classes will be provided on a variety of topics geared toward better understanding of heart health and risk factor modification. Participant will state understanding/return demonstration of topics presented as noted by education test scores.  Learning Barriers/Preferences: Learning Barriers/Preferences - 02/13/19 1407      Learning Barriers/Preferences   Learning Barriers  None    Learning Preferences  Group Instruction;Pictoral;Individual Instruction;Verbal Instruction;Skilled Demonstration       Education Topics:  AED/CPR: - Group verbal and written instruction with the use of models to demonstrate the basic use of the AED with the basic ABC's of resuscitation.   General Nutrition Guidelines/Fats and Fiber: -Group instruction provided by verbal, written material, models and posters to present the general guidelines for heart healthy nutrition. Gives an explanation and review of dietary fats and fiber.   Controlling Sodium/Reading Food Labels: -Group verbal and written material supporting the discussion of sodium use in heart healthy nutrition. Review and explanation with models, verbal and written materials for utilization of the food label.   Exercise Physiology & General Exercise Guidelines: - Group verbal and written instruction with models to review the exercise physiology of the cardiovascular system and associated critical values. Provides general exercise guidelines with specific guidelines to those with heart or lung disease.    Aerobic Exercise & Resistance Training: - Gives group verbal and written instruction on the various components of exercise. Focuses on aerobic and resistive training programs and the benefits of this training and how to safely progress through these programs..   Flexibility, Balance, Mind/Body  Relaxation: Provides group verbal/written instruction on the benefits of flexibility and balance training, including mind/body exercise modes such as yoga, pilates and tai chi.  Demonstration and skill practice provided.   Stress and Anxiety: - Provides group verbal and written instruction about the health risks of elevated stress and causes of high stress.  Discuss the correlation between heart/lung disease and anxiety and treatment options. Review healthy ways to manage with stress and anxiety.   Depression: - Provides group verbal and written instruction on the correlation between heart/lung disease and depressed mood, treatment options, and the stigmas associated with seeking treatment.   Anatomy & Physiology of the Heart: - Group verbal and written instruction and models provide basic cardiac anatomy and physiology, with the coronary electrical and arterial systems. Review of Valvular disease and Heart Failure   Cardiac Procedures: - Group verbal and written instruction to review commonly prescribed medications for heart disease. Reviews the medication, class of the drug, and side effects. Includes the steps to properly store meds and maintain the prescription regimen. (beta blockers and nitrates)   Cardiac Medications I: - Group verbal and written instruction to review commonly prescribed medications for heart disease. Reviews the medication, class of the drug, and side effects. Includes the steps to properly store meds and maintain the prescription regimen.   Cardiac Medications II: -Group verbal and written  instruction to review commonly prescribed medications for heart disease. Reviews the medication, class of the drug, and side effects. (all other drug classes)    Go Sex-Intimacy & Heart Disease, Get SMART - Goal Setting: - Group verbal and written instruction through game format to discuss heart disease and the return to sexual intimacy. Provides group verbal and written  material to discuss and apply goal setting through the application of the S.M.A.R.T. Method.   Other Matters of the Heart: - Provides group verbal, written materials and models to describe Stable Angina and Peripheral Artery. Includes description of the disease process and treatment options available to the cardiac patient.   Exercise & Equipment Safety: - Individual verbal instruction and demonstration of equipment use and safety with use of the equipment.   Cardiac Rehab from 02/13/2019 in [facility] Cardiac and Pulmonary Rehab  Date  02/13/19  Educator  [facility]  Instruction Review Code  1- Verbalizes Understanding      Infection Prevention: - Provides verbal and written material to individual with discussion of infection control including proper hand washing and proper equipment cleaning during exercise session.   Cardiac Rehab from 02/13/2019 in [facility] Cardiac and Pulmonary Rehab  Date  02/13/19  Educator  [facility]  Instruction Review Code  1- Verbalizes Understanding      Falls Prevention: - Provides verbal and written material to individual with discussion of falls prevention and safety.   Cardiac Rehab from 02/13/2019 in [facility] Cardiac and Pulmonary Rehab  Date  02/13/19  Educator  [facility]  Instruction Review Code  1- Verbalizes Understanding      Diabetes: - Individual verbal and written instruction to review signs/symptoms of diabetes, desired ranges of glucose level fasting, after meals and with exercise. Acknowledge that pre and post exercise glucose checks will be done for 3 sessions at entry of program.   Know Your Numbers and Risk Factors: -Group verbal and written instruction about important numbers in your health.  Discussion of what are risk factors and how they play a role in the disease process.  Review of Cholesterol, Blood Pressure, Diabetes, and BMI and the role they play in your overall health.   Sleep Hygiene: -Provides group verbal and written instruction about how sleep can  affect your health.  Define sleep hygiene, discuss sleep cycles and impact of sleep habits. Review good sleep hygiene tips.    Other: -Provides group and verbal instruction on various topics (see comments)   Knowledge Questionnaire Score: Knowledge Questionnaire Score - 02/13/19 1409      Knowledge Questionnaire Score   Pre Score  24/26   correct answers reviewed with [place]. Focus on nutrition       Core Components/Risk Factors/Patient Goals at Admission: Personal Goals and Risk Factors at Admission - 02/13/19 1406      Core Components/Risk Factors/Patient Goals on Admission    Weight Management  Yes;Weight Loss    Intervention  Weight Management: Develop a combined nutrition and exercise program designed to reach desired caloric intake, while maintaining appropriate intake of nutrient and fiber, sodium and fats, and appropriate energy expenditure required for the weight goal.;Weight Management: Provide education and appropriate resources to help participant work on and attain dietary goals.;Weight Management/Obesity: Establish reasonable short term and long term weight goals.    Admit Weight  252 lb (114.3 kg)    Goal Weight: Short Term  248 lb (112.5 kg)    Goal Weight: Long Term  215 lb (97.5 kg)    Expected Outcomes  Short Term: Continue to assess and modify interventions until short term weight is achieved;Long Term: Adherence to nutrition and physical activity/exercise program aimed toward attainment of established weight goal;Weight Loss: Understanding of general recommendations for a balanced deficit meal plan, which promotes 1-2 lb weight loss per week and includes a negative energy balance of [phone] kcal/d;Understanding recommendations for meals to include 15-35% energy as protein, 25-35% energy from fat, 35-60% energy from carbohydrates, less than '200mg'$  of dietary cholesterol, 20-35 gm of total fiber daily;Understanding of distribution of calorie intake throughout the day with  the consumption of 4-5 meals/snacks    Hypertension  Yes    Intervention  Provide education on lifestyle modifcations including regular physical activity/exercise, weight management, moderate sodium restriction and increased consumption of fresh fruit, vegetables, and low fat dairy, alcohol moderation, and smoking cessation.;Monitor prescription use compliance.    Expected Outcomes  Short Term: Continued assessment and intervention until BP is < 140/47m HG in hypertensive participants. < 130/848mHG in hypertensive participants with diabetes, heart failure or chronic kidney disease.;Long Term: Maintenance of blood pressure at goal levels.    Lipids  Yes    Intervention  Provide education and support for participant on nutrition & aerobic/resistive exercise along with prescribed medications to achieve LDL '70mg'$ , HDL >'40mg'$ .    Expected Outcomes  Short Term: Participant states understanding of desired cholesterol values and is compliant with medications prescribed. Participant is following exercise prescription and nutrition guidelines.;Long Term: Cholesterol controlled with medications as prescribed, with individualized exercise RX and with personalized nutrition plan. Value goals: LDL < '70mg'$ , HDL > 40 mg.       Core Components/Risk Factors/Patient Goals Review:    Core Components/Risk Factors/Patient Goals at Discharge (Final Review):    ITP Comments: ITP Comments    Row Name 02/13/19 1339 02/28/19 1007 03/05/19 1337       ITP Comments  Med Review completed. Initial ITP created. Diagnosis can be found in [facility]  Our program is currently closed due to COVID-19.  We are communicating with patient via phone calls and emails.   30 day review completed. Continue with ITP unless directed changes by Medical Director at review        Comments:

## 2019-03-06 ENCOUNTER — Ambulatory Visit: Payer: Medicare Other

## 2019-03-10 ENCOUNTER — Ambulatory Visit: Payer: Medicare Other

## 2019-03-12 ENCOUNTER — Ambulatory Visit: Payer: Medicare Other

## 2019-03-13 ENCOUNTER — Ambulatory Visit: Payer: Medicare Other

## 2019-03-15 IMAGING — CT NM PET TUM IMG INITIAL (PI) SKULL BASE T - THIGH
9 of 10 series · 19 of 25 positions shown · non-contrast
Comparison: CT scan 08/05/2018

CLINICAL DATA: Initial treatment strategy for malt lymphoma
(low-grade B-cell) of the colon..

EXAM:
NUCLEAR MEDICINE PET SKULL BASE TO THIGH
TECHNIQUE: 13.54 mCi F-18 FDG was injected intravenously. Full-ring PET imaging
was performed from the skull base to thigh after the radiotracer. CT
data was obtained and used for attenuation correction and anatomic
localization.
Fasting blood glucose: 122 mg/dl

[Series 3: ct wb 5.0 b30f · axial · 5.0mm · 0.98mm/px · z∈[-1182,-690]mm · 2 of 329 slices shown]
[im 1/329]
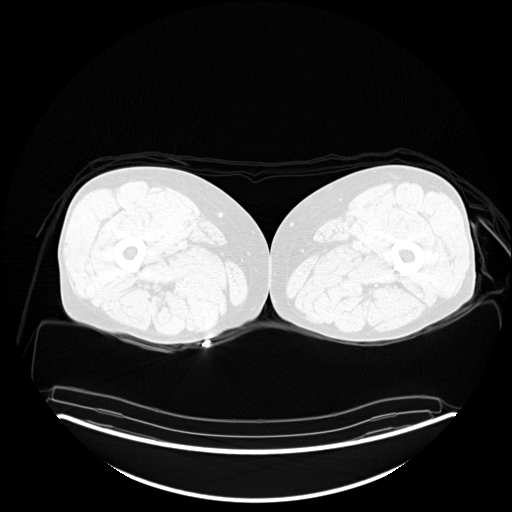
[im 165/329]
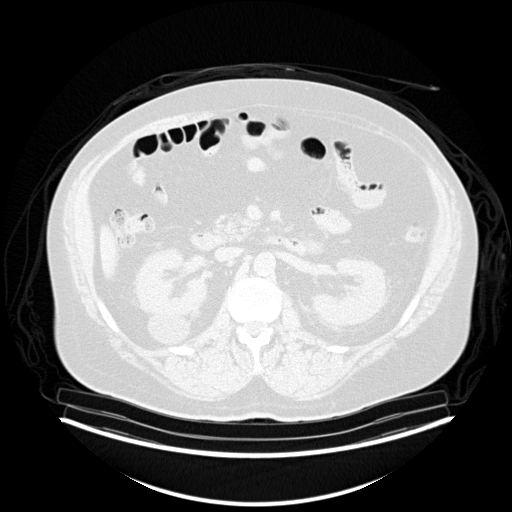

[Series 4: pet wb (ac) · axial · 5.0mm · 3.39mm/px · z∈[-1182,-198]mm · 3 of 329 slices shown]
[im 1/329]
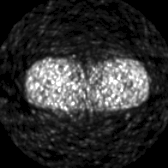
[im 165/329]
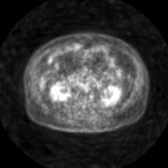
[im 329/329]
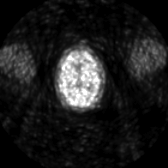

[Series 5: pet wb uncorrected (nac) · axial · 5.0mm · 4.07mm/px · z∈[-856,-198]mm · 3 of 329 slices shown]
[im 110/329]
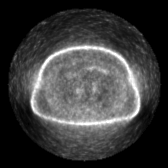
[im 219/329]
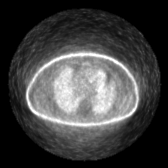
[im 329/329]
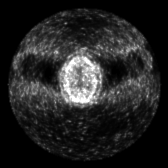

[Series 603: fused axial · 3 of 328 slices shown]
[im 110/328]
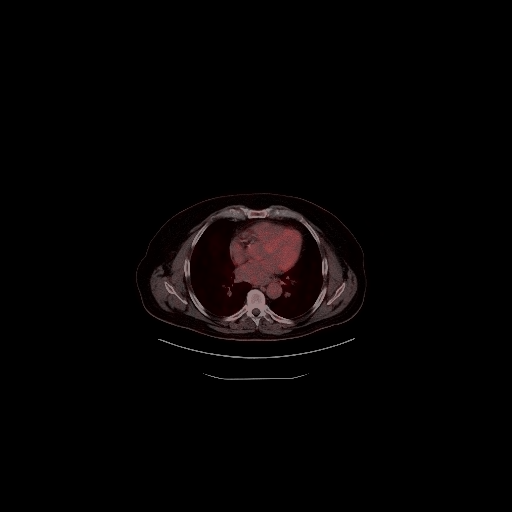
[im 219/328]
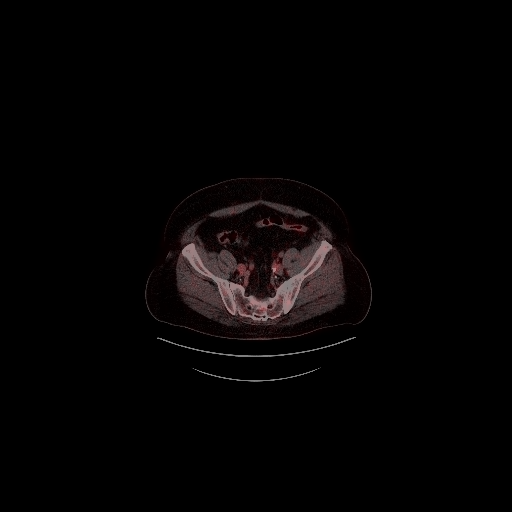
[im 328/328]
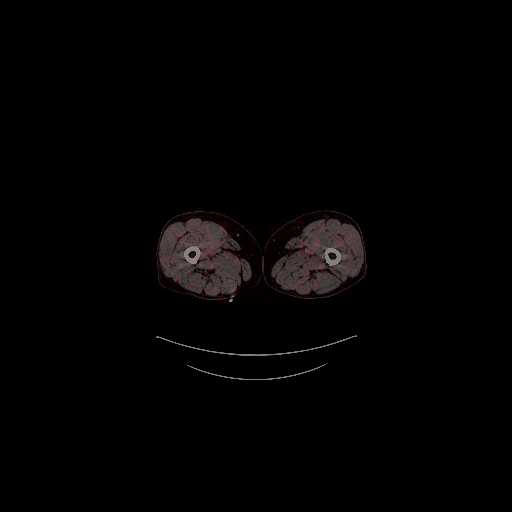

[Series 605: fused sagittal · 2 of 153 slices shown]
[im 1/153]
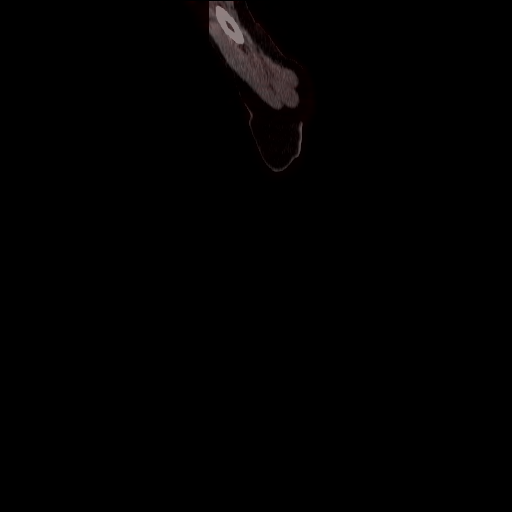
[im 153/153]
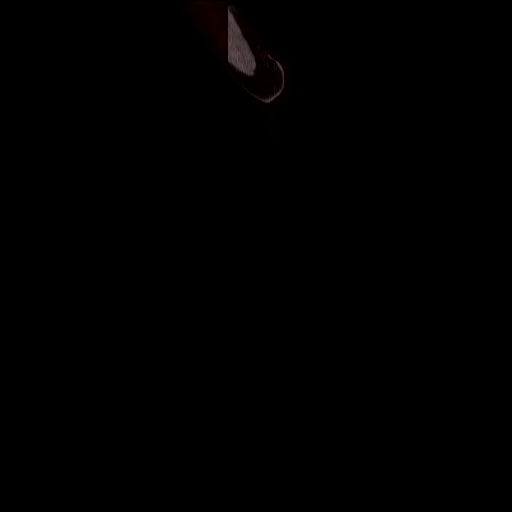

[Series 606: pet axial · 3 of 328 slices shown]
[im 1/328]
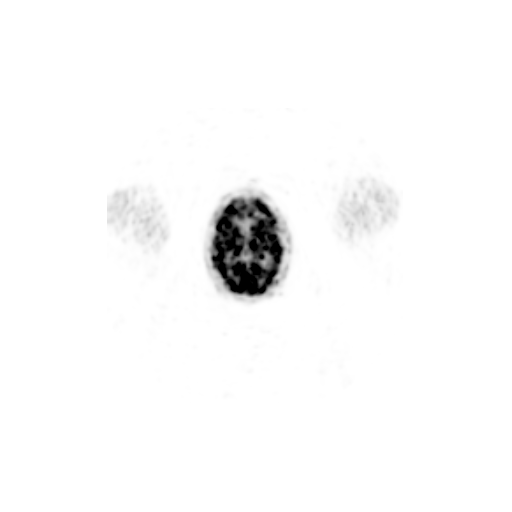
[im 219/328]
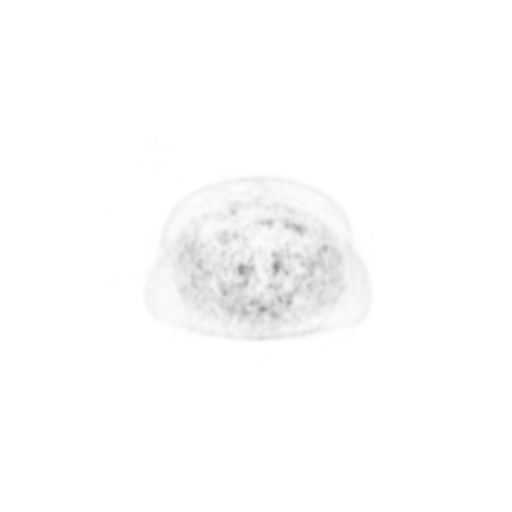
[im 328/328]
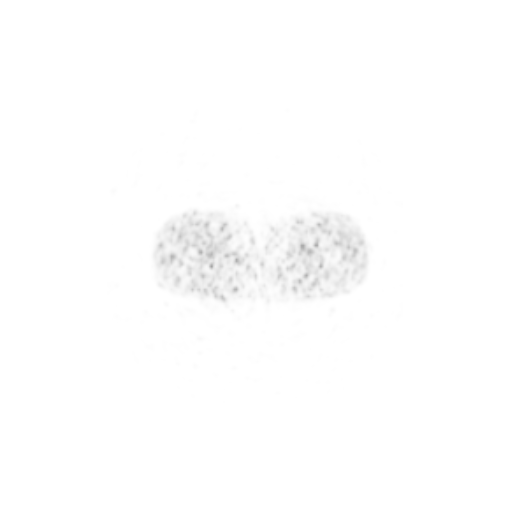

[Series 607: pet coronal · 1 of 114 slices shown]
[im 1/114]
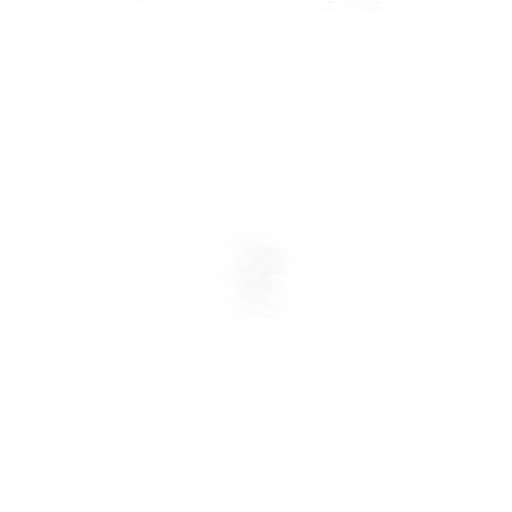

[Series 608: pet sagittal · 1 of 157 slices shown]
[im 157/157]
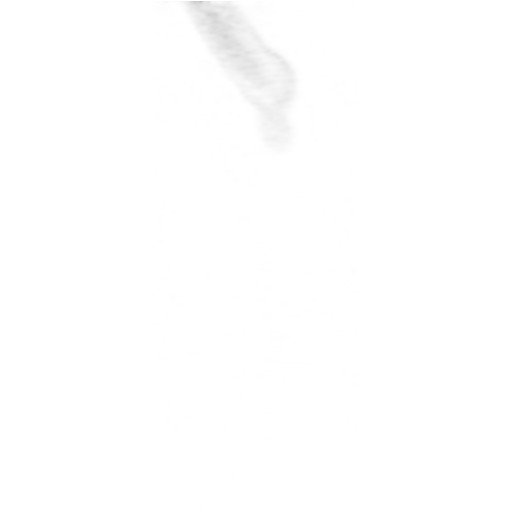

[Series 1064: results mm oncology reading · 1.0mm · 0.45mm/px · 1 of 1 slices shown]
[im 1/1]
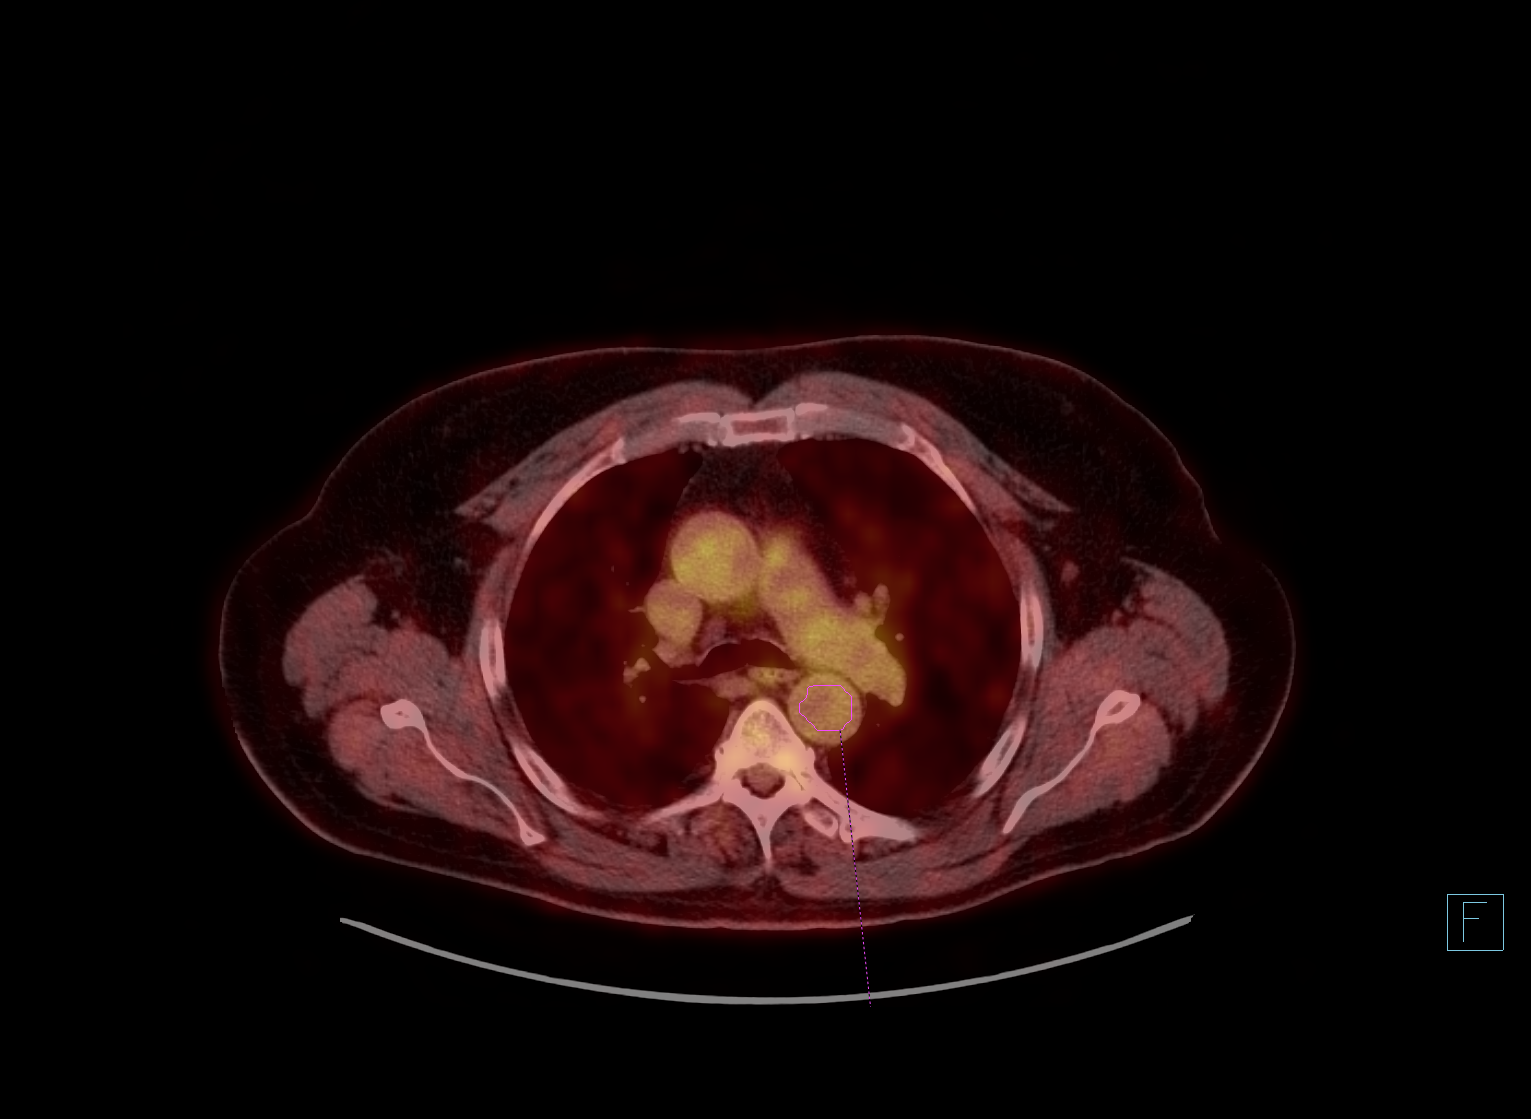

[19 of 25 positions shown; findings below may reference images not displayed]

FINDINGS: Mediastinal blood pool activity: SUV max

NECK: No hypermetabolic lymph nodes in the neck.

Incidental CT findings: Bilateral carotid artery calcifications are
noted.

CHEST: No hypermetabolic mediastinal or hilar nodes. No
supraclavicular or axillary lymphadenopathy. No suspicious pulmonary
nodules on the CT scan.

Incidental CT findings: Fairly extensive coronary artery
calcifications.

ABDOMEN/PELVIS: No abnormal hypermetabolic activity within the
liver, pancreas, adrenal glands, or spleen. No hypermetabolic lymph
nodes in the abdomen or pelvis.

Do not see any definite colonic lesions or areas of worrisome
hypermetabolism.

Incidental CT findings: Moderate vascular calcifications.

Status post splenectomy with numerous upper abdominal splenules.

SKELETON: No focal hypermetabolic activity to suggest skeletal
metastasis.

Incidental CT findings: none
IMPRESSION: Negative PET-CT. No findings suspicious for metabolically active
lymphoma.

## 2019-03-17 ENCOUNTER — Ambulatory Visit: Payer: Medicare Other

## 2019-03-19 ENCOUNTER — Ambulatory Visit: Payer: Medicare Other

## 2019-03-20 ENCOUNTER — Ambulatory Visit: Payer: Medicare Other

## 2019-03-24 ENCOUNTER — Ambulatory Visit: Payer: Medicare Other

## 2019-03-26 ENCOUNTER — Ambulatory Visit: Payer: Medicare Other

## 2019-03-27 ENCOUNTER — Ambulatory Visit: Payer: Medicare Other

## 2019-03-31 ENCOUNTER — Ambulatory Visit: Payer: Medicare Other

## 2019-04-02 ENCOUNTER — Ambulatory Visit: Payer: Medicare Other

## 2019-04-03 ENCOUNTER — Ambulatory Visit: Payer: Medicare Other

## 2019-04-03 NOTE — Progress Notes (Signed)
LMOM

## 2019-04-07 ENCOUNTER — Ambulatory Visit: Payer: Medicare Other

## 2019-04-09 ENCOUNTER — Ambulatory Visit: Payer: Medicare Other

## 2019-04-10 ENCOUNTER — Ambulatory Visit: Payer: Medicare Other

## 2019-04-14 ENCOUNTER — Ambulatory Visit: Payer: Medicare Other

## 2019-04-16 ENCOUNTER — Ambulatory Visit: Payer: Medicare Other

## 2019-04-17 ENCOUNTER — Ambulatory Visit: Payer: Medicare Other

## 2019-04-21 ENCOUNTER — Ambulatory Visit: Payer: Medicare Other

## 2019-04-23 ENCOUNTER — Ambulatory Visit: Payer: Medicare Other

## 2019-04-24 ENCOUNTER — Ambulatory Visit: Payer: Medicare Other

## 2019-04-28 ENCOUNTER — Ambulatory Visit: Payer: Medicare Other

## 2019-04-30 ENCOUNTER — Ambulatory Visit: Payer: Medicare Other

## 2019-05-01 ENCOUNTER — Ambulatory Visit: Payer: Medicare Other

## 2019-05-01 ENCOUNTER — Telehealth: Payer: Self-pay | Admitting: Internal Medicine

## 2019-05-01 NOTE — Telephone Encounter (Signed)

## 2019-05-07 ENCOUNTER — Ambulatory Visit: Payer: Medicare Other

## 2019-05-08 ENCOUNTER — Ambulatory Visit: Payer: Medicare Other

## 2019-05-12 ENCOUNTER — Ambulatory Visit: Payer: Medicare Other

## 2019-05-14 ENCOUNTER — Ambulatory Visit: Payer: Medicare Other

## 2019-05-14 ENCOUNTER — Other Ambulatory Visit: Payer: Self-pay

## 2019-05-15 ENCOUNTER — Inpatient Hospital Stay: Payer: Medicare Other | Admitting: Internal Medicine

## 2019-05-15 ENCOUNTER — Inpatient Hospital Stay: Payer: Medicare Other

## 2019-05-15 ENCOUNTER — Ambulatory Visit: Payer: Medicare Other

## 2019-05-20 ENCOUNTER — Telehealth: Payer: Self-pay

## 2019-05-20 NOTE — Progress Notes (Signed)
Virtual Visit via Video Note   This visit type was conducted due to national recommendations for restrictions regarding the COVID-19 Pandemic (e.g. social distancing) in an effort to limit this patient's exposure and mitigate transmission in our community.  Due to his co-morbid illnesses, this patient is at least at moderate risk for complications without adequate follow up.  This format is felt to be most appropriate for this patient at this time.  All issues noted in this document were discussed and addressed.  A limited physical exam was performed with this format.  Please refer to the patient's chart for his consent to telehealth for [facility].   Date:  05/20/2019   ID:  Jesse Norton, DOB Sep 18, 1946, MRN [id number]  Patient Location: Home Provider Location: Office  PCP:  [name], MD  Cardiologist:  Nelva Bush, MD  Electrophysiologist:  None   Evaluation Performed:  Follow-Up Visit  Chief Complaint:  Follow-up coronary artery disease  History of Present Illness:    AEDEN Norton is a 73 y.o. male with history of coronary artery disease, HFpEF, hypertension, Barrett's esophagus, gout, BPH, and obesity.  We are speaking today for follow-up of his coronary artery disease.  He was last seen in our office in early March by Ignacia Bayley, NP.  He was doing well at that time following staged PCI's to the LCx and LAD.  No medication changes or additional testing were performed at that time.  Today, Mr. Kistler reports that he has been doing quite well.  He continues to increase his exercise and is now walking and biking regularly.  He has also started meditating recently and feels like this is helping him relax and sleep better.  He notes two episodes of brief chest pain lasting only a second or two.  The pain is not reminiscent of the angina that he experienced prior to his two-vessel PCI earlier this year.  He denies shortness of breath, palpitations, lightheadedness, and  edema.  He is tolerating his medications well.  Blood pressure has been well-controlled (less than 140/90).  He is looking forward to his hiking trip to [place] in late September.  The patient does not have symptoms concerning for COVID-19 infection (fever, chills, cough, or new shortness of breath).    Past Medical History:  Diagnosis Date   (HFpEF) heart failure with preserved ejection fraction ([place])    a. 01/2019 Cath: EF 55-60% w/ elevated LVEDP.   Anxiety    Barrett's esophagus    CAD (coronary artery disease)    a. 10/2018 ETT: 72mm horizontal inflat ST depression, freq PVC's->Intermediate; b. 12/2018 Cardiac CTA: LM nl, LAD FFRct 0.6 in mLAD, LCX FFRct 0.99p, 0.23m, 0.77d, RCA no signif dzs, FFRct 0.89d->Rec cath; c. 01/2019 PCI: LM nl, LAD 40p, 103m, D2 70, LCX 70p (2.75x15 Resolute Onyx DES), RCA min irregs. EF 55-65%. EDP 25-22mmHg; d. 01/2019 PCI: LAD 80-90 (atherectomy & 3x15 Resolute Onyx DES).   Colon polyps    Duodenal adenoma    Gastritis    GERD (gastroesophageal reflux disease)    Gout    H. pylori infection    H/O urethral stricture    Hemorrhoids    Hiatal hernia    Hypertension    Stomach ulcer    Past Surgical History:  Procedure Laterality Date   COLONOSCOPY     COLONOSCOPY WITH PROPOFOL N/A 08/20/2018   Procedure: COLONOSCOPY WITH PROPOFOL;  Surgeon: Lollie Sails, MD;  Location[facility] ENDOSCOPY;  Service: Endoscopy;  Laterality: N/A;   COLONOSCOPY WITH PROPOFOL N/A 12/19/2018   Procedure: COLONOSCOPY WITH PROPOFOL;  Surgeon: Lollie Sails, MD;  Location: [facility] ENDOSCOPY;  Service: Endoscopy;  Laterality: N/A;   CORONARY ATHERECTOMY N/A 02/03/2019   Procedure: CORONARY ATHERECTOMY;  Surgeon: Nelva Bush, MD;  Location: [place] CV LAB;  Service: Cardiovascular;  Laterality: N/A;   CORONARY STENT INTERVENTION N/A 01/21/2019   Procedure: CORONARY STENT INTERVENTION;  Surgeon: Nelva Bush, MD;  Location: [place] CV LAB;   Service: Cardiovascular;  Laterality: N/A;   CORONARY STENT INTERVENTION N/A 02/03/2019   Procedure: CORONARY STENT INTERVENTION;  Surgeon: Nelva Bush, MD;  Location: [place] CV LAB;  Service: Cardiovascular;  Laterality: N/A;   CYSTOSCOPY WITH URETHRAL DILATATION N/A 08/16/2018   Procedure: CYSTOSCOPY WITH URETHRAL DILATATION;  Surgeon: [name], MD;  Location: [facility] ORS;  Service: Urology;  Laterality: N/A;   ESOPHAGOGASTRODUODENOSCOPY N/A 01/29/2017   Procedure: ESOPHAGOGASTRODUODENOSCOPY (EGD);  Surgeon: Lollie Sails, MD;  Location: [facility] ENDOSCOPY;  Service: Endoscopy;  Laterality: N/A;   ESOPHAGOGASTRODUODENOSCOPY (EGD) WITH PROPOFOL N/A 07/10/2016   Procedure: ESOPHAGOGASTRODUODENOSCOPY (EGD) WITH PROPOFOL;  Surgeon: Lollie Sails, MD;  Location: [facility] ENDOSCOPY;  Service: Endoscopy;  Laterality: N/A;   ESOPHAGOGASTRODUODENOSCOPY (EGD) WITH PROPOFOL N/A 12/19/2018   Procedure: ESOPHAGOGASTRODUODENOSCOPY (EGD) WITH PROPOFOL;  Surgeon: Lollie Sails, MD;  Location: [facility] ENDOSCOPY;  Service: Endoscopy;  Laterality: N/A;   gastritis     h.pylori     INTRAVASCULAR ULTRASOUND/IVUS N/A 02/03/2019   Procedure: Intravascular Ultrasound/IVUS;  Surgeon: Nelva Bush, MD;  Location: [place] CV LAB;  Service: Cardiovascular;  Laterality: N/A;   left elbow repair Left    LEFT HEART CATH AND CORONARY ANGIOGRAPHY Left 01/21/2019   Procedure: LEFT HEART CATH AND CORONARY ANGIOGRAPHY;  Surgeon: Nelva Bush, MD;  Location: [place] CV LAB;  Service: Cardiovascular;  Laterality: Left;   SPLENECTOMY  1957   TONSILLECTOMY       No outpatient medications have been marked as taking for the 05/21/19 encounter (Appointment) with Jesse Norton, Harrell Gave, MD.     Allergies:   Patient has no known allergies.   Social History   Tobacco Use   Smoking status: Never Smoker   Smokeless tobacco: Never Used  Substance Use Topics   Alcohol use: Yes    Alcohol/week:  5.0 standard drinks    Types: 5 Glasses of wine per week    Comment: OCCASIONAL   Drug use: No     Family Hx: The patient's family history includes Heart attack (age of onset: 42) in his mother; Hyperlipidemia in his brother and sister; Hypertension in his brother and sister; Pancreatic cancer in his father. There is no history of Prostate cancer, Kidney cancer, or Bladder Cancer. He was adopted.  ROS:   Please see the history of present illness.   All other systems reviewed and are negative.   Prior CV studies:   The following studies were reviewed today:  PCI (02/03/2019): 1. Severe, heavily calcified mid LAD stenosis (80-90%), unchanged from diagnostic catheterization earlier this month. 2. Widely patent LCx stent. 3. Normal left ventricular filling pressure. 4. Successful orbital atherectomy and IVUS-guided PCI to the mid LAD using a Resolute Onyx 3.0 x 15 mm drug-eluting stent (postdilated to 3.6 mm) with 0% residual stenosis and TIMI-3 flow.  LHC/PCI (01/21/2019): 1. Significant 2-vessel coronary artery disease, including 80-90% mid LAD stenosis with heavy calcification, as well as 70% proximal LCx stenosis. 2. Normal left ventricular systolic function. 3. Moderately elevated left  ventricular filling pressure. 4. Successful PCI to proximal LCx using Resolute Onyx 2.75 x 15 mm drug-eluting stent (postdilated to 3.1 mm proximally) with 0% residual stenosis and TIMI-3 flow.  Cardiac CTA (12/23/2018): Multivessel CAD with hemodynamically significant disease involving the mid LAD and distal LCx.  Exercise tolerance test (11/06/2018): Intermediate risk exercise tolerance test with 1 mm horizontal ST depressions and frequent PVC's during stress.  Labs/Other Tests and Data Reviewed:    EKG:  No ECG reviewed.  Recent Labs: 06/26/2018: TSH 1.240 02/04/2019: BUN 14; Creatinine, Ser 0.96; Hemoglobin 12.6; Platelets 232; Potassium 4.1; Sodium 138 02/12/2019: ALT (SGPT) [place]colo, Waived 30     Recent Lipid Panel Lab Results  Component Value Date/Time   CHOL 124 02/12/2019 01:50 PM   TRIG 58 02/12/2019 01:50 PM   HDL 59 06/26/2018 11:21 AM   CHOLHDL 2.9 06/26/2018 11:21 AM   LDLCALC 98 06/26/2018 11:21 AM    Wt Readings from Last 3 Encounters:  02/17/19 251 lb 11.2 oz (114.2 kg)  02/14/19 251 lb (113.9 kg)  02/13/19 252 lb 3.2 oz (114.4 kg)     Objective:    Vital Signs:  There were no vitals taken for this visit.   VITAL SIGNS:  reviewed GEN:  no acute distress  ASSESSMENT & PLAN:    Coronary artery disease: Mr. Lampert continues to do well following his staged 2-vessel PCI in February.  Brief episodes of chest pain are not consistent with angina.  We will continue his current medications for secondary prevention, including at least 6 months of DAPT with aspirin and clopidogrel (I favor long-term DAPT given multivessel CAD/PCI, as tolerated).  Hyperlipidemia: Continue high-intensity statin therapy.  LDL at goal.  Hypertension: BP upper normal.  Continue current medications and lifestyle modifications.  COVID-19 Education: The signs and symptoms of COVID-19 were discussed with the patient and how to seek care for testing (follow up with PCP or arrange E-visit).  The importance of social distancing was discussed today.  Time:   Today, I have spent 15 minutes with the patient with telehealth technology discussing the above problems.     Medication Adjustments/Labs and Tests Ordered: Current medicines are reviewed at length with the patient today.  Concerns regarding medicines are outlined above.   Tests Ordered: None.  Medication Changes: None.  Disposition:  Follow up in person in late August or early September (before trip to [place]).  Signed, Nelva Bush, MD  05/20/2019 6:08 PM    [place]

## 2019-05-20 NOTE — Telephone Encounter (Signed)
LMOM about [facility]

## 2019-05-21 ENCOUNTER — Encounter: Payer: Self-pay | Admitting: Internal Medicine

## 2019-05-21 ENCOUNTER — Other Ambulatory Visit: Payer: Self-pay

## 2019-05-21 ENCOUNTER — Telehealth (INDEPENDENT_AMBULATORY_CARE_PROVIDER_SITE_OTHER): Payer: Medicare Other | Admitting: Internal Medicine

## 2019-05-21 VITALS — BP 137/80 | HR 64 | Ht 71.0 in | Wt 248.0 lb

## 2019-05-21 DIAGNOSIS — E785 Hyperlipidemia, unspecified: Secondary | ICD-10-CM | POA: Diagnosis not present

## 2019-05-21 DIAGNOSIS — I10 Essential (primary) hypertension: Secondary | ICD-10-CM

## 2019-05-21 DIAGNOSIS — I2510 Atherosclerotic heart disease of native coronary artery without angina pectoris: Secondary | ICD-10-CM | POA: Diagnosis not present

## 2019-05-21 NOTE — Patient Instructions (Signed)
Medication Instructions:  Your physician recommends that you continue on your current medications as directed. Please refer to the Current Medication list given to you today.  If you need a refill on your cardiac medications before your next appointment, please call your pharmacy.   Lab work: - None ordered.  If you have labs (blood work) drawn today and your tests are completely normal, you will receive your results only by: Marland Kitchen MyChart Message (if you have MyChart) OR . A paper copy in the mail If you have any lab test that is abnormal or we need to change your treatment, we will call you to review the results.  Testing/Procedures: - None ordered.   Follow-Up: At [facility], you and your health needs are our priority.  As part of our continuing mission to provide you with exceptional heart care, we have created designated Provider Care Teams.  These Care Teams include your primary Cardiologist (physician) and Advanced Practice Providers (APPs -  Physician Assistants and Nurse Practitioners) who all work together to provide you with the care you need, when you need it. You will need a follow up appointment in 3 months (late August/Early September) with Dr End or Ignacia Bayley, NP.   Please call our office 2 months in advance to schedule this appointment.  You may see Nelva Bush, MDor one of the following Advanced Practice Providers on your designated Care Team:   Murray Hodgkins, NP Christell Faith, PA-C . Marrianne Mood, PA-C

## 2019-05-26 ENCOUNTER — Telehealth: Payer: Self-pay | Admitting: Family Medicine

## 2019-05-26 NOTE — Chronic Care Management (AMB) (Signed)
Chronic Care Management   Note  05/26/2019 Name: Jesse Norton MRN: [id number] DOB: [birth year]  Jesse Norton is a 73 y.o. year old male who is a primary care patient of Crissman, Jeannette How, MD. I reached out to [name] by phone today in response to a referral sent by Jesse Norton [employer] health plan.    Mr. Alfieri was given information about Chronic Care Management services today including:  1. CCM service includes personalized support from designated clinical staff supervised by his physician, including individualized plan of care and coordination with other care providers 2. 24/7 contact phone numbers for assistance for urgent and routine care needs. 3. Service will only be billed when office clinical staff spend 20 minutes or more in a month to coordinate care. 4. Only one practitioner may furnish and bill the service in a calendar month. 5. The patient may stop CCM services at any time (effective at the end of the month) by phone call to the office staff. 6. The patient will be responsible for cost sharing (co-pay) of up to 20% of the service fee (after annual deductible is met).  Patient agreed to services and verbal consent obtained.   Follow up plan: Telephone appointment with CCM team member scheduled for: 06/11/2019  [place]  [email or web address]   ??[phone]

## 2019-06-04 ENCOUNTER — Other Ambulatory Visit: Payer: Self-pay

## 2019-06-04 ENCOUNTER — Ambulatory Visit (INDEPENDENT_AMBULATORY_CARE_PROVIDER_SITE_OTHER): Payer: Medicare Other

## 2019-06-04 VITALS — BP 136/82 | HR 68 | Temp 97.6°F | Resp 16 | Ht 69.0 in | Wt 253.8 lb

## 2019-06-04 DIAGNOSIS — Z0000 Encounter for general adult medical examination without abnormal findings: Secondary | ICD-10-CM | POA: Diagnosis not present

## 2019-06-04 NOTE — Patient Instructions (Signed)
Jesse Norton , Thank you for taking time to come for your Medicare Wellness Visit. I appreciate your ongoing commitment to your health goals. Please review the following plan we discussed and let me know if I can assist you in the future.   Screening recommendations/referrals: Colonoscopy: completed 12/2018, repeat in 2013  Recommended yearly ophthalmology/optometry visit for glaucoma screening and checkup Recommended yearly dental visit for hygiene and checkup  Vaccinations: Influenza vaccine: up to date  Pneumococcal vaccine: up to date Tdap vaccine: up to date  Shingles vaccine: shingrix eligible, check with your insurance company for coverage   Advanced directives: Please bring a copy of your health care power of attorney and living will to the office at your convenience.  Conditions/risks identified: Continue follow up with Chronic Care Management Team.   Next appointment: Follow up in one year for your annual wellness exam.   Preventive Care 65 Years and Older, Male Preventive care refers to lifestyle choices and visits with your health care provider that can promote health and wellness. What does preventive care include?  A yearly physical exam. This is also called an annual well check.  Dental exams once or twice a year.  Routine eye exams. Ask your health care provider how often you should have your eyes checked.  Personal lifestyle choices, including:  Daily care of your teeth and gums.  Regular physical activity.  Eating a healthy diet.  Avoiding tobacco and drug use.  Limiting alcohol use.  Practicing safe sex.  Taking low doses of aspirin every day.  Taking vitamin and mineral supplements as recommended by your health care provider. What happens during an annual well check? The services and screenings done by your health care provider during your annual well check will depend on your age, overall health, lifestyle risk factors, and family history of disease.  Counseling  Your health care provider may ask you questions about your:  Alcohol use.  Tobacco use.  Drug use.  Emotional well-being.  Home and relationship well-being.  Sexual activity.  Eating habits.  History of falls.  Memory and ability to understand (cognition).  Work and work Statistician. Screening  You may have the following tests or measurements:  Height, weight, and BMI.  Blood pressure.  Lipid and cholesterol levels. These may be checked every 5 years, or more frequently if you are over 52 years old.  Skin check.  Lung cancer screening. You may have this screening every year starting at age 20 if you have a 30-pack-year history of smoking and currently smoke or have quit within the past 15 years.  Fecal occult blood test (FOBT) of the stool. You may have this test every year starting at age 70.  Flexible sigmoidoscopy or colonoscopy. You may have a sigmoidoscopy every 5 years or a colonoscopy every 10 years starting at age 37.  Prostate cancer screening. Recommendations will vary depending on your family history and other risks.  Hepatitis C blood test.  Hepatitis B blood test.  Sexually transmitted disease (STD) testing.  Diabetes screening. This is done by checking your blood sugar (glucose) after you have not eaten for a while (fasting). You may have this done every 1-3 years.  Abdominal aortic aneurysm (AAA) screening. You may need this if you are a current or former smoker.  Osteoporosis. You may be screened starting at age 38 if you are at high risk. Talk with your health care provider about your test results, treatment options, and if necessary, the need for more  tests. Vaccines  Your health care provider may recommend certain vaccines, such as:  Influenza vaccine. This is recommended every year.  Tetanus, diphtheria, and acellular pertussis (Tdap, Td) vaccine. You may need a Td booster every 10 years.  Zoster vaccine. You may need this  after age 61.  Pneumococcal 13-valent conjugate (PCV13) vaccine. One dose is recommended after age 90.  Pneumococcal polysaccharide (PPSV23) vaccine. One dose is recommended after age 31. Talk to your health care provider about which screenings and vaccines you need and how often you need them. This information is not intended to replace advice given to you by your health care provider. Make sure you discuss any questions you have with your health care provider. Document Released: 12/24/2015 Document Revised: 08/16/2016 Document Reviewed: 09/28/2015 Elsevier Interactive Patient Education  2017 [place] Prevention in the Home Falls can cause injuries. They can happen to people of all ages. There are many things you can do to make your home safe and to help prevent falls. What can I do on the outside of my home?  Regularly fix the edges of walkways and driveways and fix any cracks.  Remove anything that might make you trip as you walk through a door, such as a raised step or threshold.  Trim any bushes or trees on the path to your home.  Use bright outdoor lighting.  Clear any walking paths of anything that might make someone trip, such as rocks or tools.  Regularly check to see if handrails are loose or broken. Make sure that both sides of any steps have handrails.  Any raised decks and porches should have guardrails on the edges.  Have any leaves, snow, or ice cleared regularly.  Use sand or salt on walking paths during winter.  Clean up any spills in your garage right away. This includes oil or grease spills. What can I do in the bathroom?  Use night lights.  Install grab bars by the toilet and in the tub and shower. Do not use towel bars as grab bars.  Use non-skid mats or decals in the tub or shower.  If you need to sit down in the shower, use a plastic, non-slip stool.  Keep the floor dry. Clean up any water that spills on the floor as soon as it happens.   Remove soap buildup in the tub or shower regularly.  Attach bath mats securely with double-sided non-slip rug tape.  Do not have throw rugs and other things on the floor that can make you trip. What can I do in the bedroom?  Use night lights.  Make sure that you have a light by your bed that is easy to reach.  Do not use any sheets or blankets that are too big for your bed. They should not hang down onto the floor.  Have a firm chair that has side arms. You can use this for support while you get dressed.  Do not have throw rugs and other things on the floor that can make you trip. What can I do in the kitchen?  Clean up any spills right away.  Avoid walking on wet floors.  Keep items that you use a lot in easy-to-reach places.  If you need to reach something above you, use a strong step stool that has a grab bar.  Keep electrical cords out of the way.  Do not use floor polish or wax that makes floors slippery. If you must use wax, use non-skid floor  wax.  Do not have throw rugs and other things on the floor that can make you trip. What can I do with my stairs?  Do not leave any items on the stairs.  Make sure that there are handrails on both sides of the stairs and use them. Fix handrails that are broken or loose. Make sure that handrails are as long as the stairways.  Check any carpeting to make sure that it is firmly attached to the stairs. Fix any carpet that is loose or worn.  Avoid having throw rugs at the top or bottom of the stairs. If you do have throw rugs, attach them to the floor with carpet tape.  Make sure that you have a light switch at the top of the stairs and the bottom of the stairs. If you do not have them, ask someone to add them for you. What else can I do to help prevent falls?  Wear shoes that:  Do not have high heels.  Have rubber bottoms.  Are comfortable and fit you well.  Are closed at the toe. Do not wear sandals.  If you use a  stepladder:  Make sure that it is fully opened. Do not climb a closed stepladder.  Make sure that both sides of the stepladder are locked into place.  Ask someone to hold it for you, if possible.  Clearly mark and make sure that you can see:  Any grab bars or handrails.  First and last steps.  Where the edge of each step is.  Use tools that help you move around (mobility aids) if they are needed. These include:  Canes.  Walkers.  Scooters.  Crutches.  Turn on the lights when you go into a dark area. Replace any light bulbs as soon as they burn out.  Set up your furniture so you have a clear path. Avoid moving your furniture around.  If any of your floors are uneven, fix them.  If there are any pets around you, be aware of where they are.  Review your medicines with your doctor. Some medicines can make you feel dizzy. This can increase your chance of falling. Ask your doctor what other things that you can do to help prevent falls. This information is not intended to replace advice given to you by your health care provider. Make sure you discuss any questions you have with your health care provider. Document Released: 09/23/2009 Document Revised: 05/04/2016 Document Reviewed: 01/01/2015 Elsevier Interactive Patient Education  2017 [employer].

## 2019-06-04 NOTE — Progress Notes (Addendum)
Subjective:   Jesse Norton is a 73 y.o. male who presents for Medicare Annual/Subsequent preventive examination.   Review of Systems:      Objective:    Vitals: BP 136/82 (BP Location: Right Arm, Patient Position: Sitting, Cuff Size: Normal)   Pulse 68   Temp 97.6 F (36.4 C) (Temporal)   Resp 16   Ht 5\' 9"  (1.753 m)   Wt 253 lb 12.8 oz (115.1 kg)   BMI 37.48 kg/m   Body mass index is 37.48 kg/m.  Advanced Directives 06/04/2019 02/13/2019 02/04/2019 02/03/2019 01/21/2019 01/21/2019 12/19/2018  Does Patient Have a Medical Advance Directive? Yes Yes Yes Yes Yes Yes Yes  Type of Advance Directive Living will;Healthcare Power of [place];Living will [place];Living will [place];Living will [place];Living will [place];Living will [place];Living will  Does patient want to make changes to medical advance directive? - No - Patient declined No - Patient declined - No - Patient declined No - Patient declined -  Copy of [place] in Chart? No - copy requested No - copy requested No - copy requested No - copy requested No - copy requested No - copy requested No - copy requested  Would patient like information on creating a medical advance directive? - - - - - - -    Tobacco Social History   Tobacco Use  Smoking Status Never Smoker  Smokeless Tobacco Never Used     Counseling given: Not Answered   Clinical Intake:  Pre-visit preparation completed: Yes  Pain : No/denies pain     Nutritional Status: BMI > 30  Obese Nutritional Risks: None Diabetes: No  How often do you need to have someone help you when you read instructions, pamphlets, or other written materials from your doctor or pharmacy?: 1 - Never What is the last grade level you completed in school?: masters  Interpreter Needed?: No  Information entered by :: Hollan Philipp,LPN  Past Medical History:  Diagnosis Date  . (HFpEF) heart failure with preserved ejection fraction ([place])    a. 01/2019 Cath: EF 55-60% w/ elevated LVEDP.  Marland Kitchen Anxiety   . Barrett's esophagus   . CAD (coronary artery disease)    a. 10/2018 ETT: 66mm horizontal inflat ST depression, freq PVC's->Intermediate; b. 12/2018 Cardiac CTA: LM nl, LAD FFRct 0.6 in mLAD, LCX FFRct 0.99p, 0.78m, 0.77d, RCA no signif dzs, FFRct 0.89d->Rec cath; c. 01/2019 PCI: LM nl, LAD 40p, 20m, D2 70, LCX 70p (2.75x15 Resolute Onyx DES), RCA min irregs. EF 55-65%. EDP 25-53mmHg; d. 01/2019 PCI: LAD 80-90 (atherectomy & 3x15 Resolute Onyx DES).  . Colon polyps   . Duodenal adenoma   . Gastritis   . GERD (gastroesophageal reflux disease)   . Gout   . H. pylori infection   . H/O urethral stricture   . Hemorrhoids   . Hiatal hernia   . Hypertension   . Stomach ulcer    Past Surgical History:  Procedure Laterality Date  . COLONOSCOPY    . COLONOSCOPY WITH PROPOFOL N/A 08/20/2018   Procedure: COLONOSCOPY WITH PROPOFOL;  Surgeon: Lollie Sails, MD;  Location: [facility] ENDOSCOPY;  Service: Endoscopy;  Laterality: N/A;  . COLONOSCOPY WITH PROPOFOL N/A 12/19/2018   Procedure: COLONOSCOPY WITH PROPOFOL;  Surgeon: Lollie Sails, MD;  Location[facility] END[facility]COPY;  Service: Endoscopy;  Laterality: N/A;  . CORONARY ATHERECTOMY N/A 02/03/2019   Procedure: CORONARY  ATHERECTOMY;  Surgeon: Nelva Bush, MD;  Location: [place] CV LAB;  Service: Cardiovascular;  Laterality: N/A;  . CORONARY STENT INTERVENTION N/A 01/21/2019   Procedure: CORONARY STENT INTERVENTION;  Surgeon: Nelva Bush, MD;  Location: [place] CV LAB;  Service: Cardiovascular;  Laterality: N/A;  . CORONARY STENT INTERVENTION N/A 02/03/2019   Procedure: CORONARY STENT INTERVENTION;  Surgeon: Nelva Bush, MD;  Location: [place] CV LAB;  Service: Cardiovascular;  Laterality: N/A;  . CYSTOSCOPY WITH URETHRAL DILATATION N/A 08/16/2018    Procedure: CYSTOSCOPY WITH URETHRAL DILATATION;  Surgeon: [name], MD;  Location: [facility] ORS;  Service: Urology;  Laterality: N/A;  . ESOPHAGOGASTRODUODENOSCOPY N/A 01/29/2017   Procedure: ESOPHAGOGASTRODUODENOSCOPY (EGD);  Surgeon: Lollie Sails, MD;  Location: [facility] ENDOSCOPY;  Service: Endoscopy;  Laterality: N/A;  . ESOPHAGOGASTRODUODENOSCOPY (EGD) WITH PROPOFOL N/A 07/10/2016   Procedure: ESOPHAGOGASTRODUODENOSCOPY (EGD) WITH PROPOFOL;  Surgeon: Lollie Sails, MD;  Location: [facility] ENDOSCOPY;  Service: Endoscopy;  Laterality: N/A;  . ESOPHAGOGASTRODUODENOSCOPY (EGD) WITH PROPOFOL N/A 12/19/2018   Procedure: ESOPHAGOGASTRODUODENOSCOPY (EGD) WITH PROPOFOL;  Surgeon: Lollie Sails, MD;  Location: [facility] ENDOSCOPY;  Service: Endoscopy;  Laterality: N/A;  . gastritis    . h.pylori    . INTRAVASCULAR ULTRASOUND/IVUS N/A 02/03/2019   Procedure: Intravascular Ultrasound/IVUS;  Surgeon: Nelva Bush, MD;  Location: [place] CV LAB;  Service: Cardiovascular;  Laterality: N/A;  . left elbow repair Left   . LEFT HEART CATH AND CORONARY ANGIOGRAPHY Left 01/21/2019   Procedure: LEFT HEART CATH AND CORONARY ANGIOGRAPHY;  Surgeon: Nelva Bush, MD;  Location: [place] CV LAB;  Service: Cardiovascular;  Laterality: Left;  . SPLENECTOMY  1957  . TONSILLECTOMY     Family History  Adopted: Yes  Problem Relation Age of Onset  . Heart attack Mother 33  . Pancreatic cancer Father        died in WWII - pt adopted  . Hypertension Sister   . Hyperlipidemia Sister   . Hypertension Brother   . Hyperlipidemia Brother   . Prostate cancer Neg Hx   . Kidney cancer Neg Hx   . Bladder Cancer Neg Hx    Social History   Socioeconomic History  . Marital status: Married    Spouse name: Not on file  . Number of children: Not on file  . Years of education: Not on file  . Highest education level: Master's degree (e.g., MA, MS, MEng, MEd, MSW, MBA)  Occupational History  . Occupation:  retired   Scientific laboratory technician  . Financial resource strain: Not hard at all  . Food insecurity    Worry: Never true    Inability: Never true  . Transportation needs    Medical: No    Non-medical: No  Tobacco Use  . Smoking status: Never Smoker  . Smokeless tobacco: Never Used  Substance and Sexual Activity  . Alcohol use: Yes    Alcohol/week: 5.0 standard drinks    Types: 5 Glasses of wine per week    Comment: OCCASIONAL  . Drug use: No  . Sexual activity: Not on file  Lifestyle  . Physical activity    Days per week: 4 days    Minutes per session: 60 min  . Stress: Not at all  Relationships  . Social connections    Talks on phone: More than three times a week    Gets together: More than three times a week    Attends religious service: More than 4 times per year  Active member of club or organization: Yes    Attends meetings of clubs or organizations: More than 4 times per year    Relationship status: Married  Other Topics Concern  . Not on file  Social History Narrative   Rides bicycle with friends       No smoking.  Occasional alcohol.  Lives at home.  Retired Customer service manager.    Outpatient Encounter Medications as of 06/04/2019  Medication Sig  . amLODipine (NORVASC) 10 MG tablet Take 1 tablet (10 mg total) by mouth daily.  Marland Kitchen aspirin EC 81 MG tablet Take 81 mg by mouth daily.  Marland Kitchen atorvastatin (LIPITOR) 40 MG tablet Take 1 tablet (40 mg total) by mouth daily.  Marland Kitchen Bioflavonoid Products (BIOFLEX PO) Take 1 tablet by mouth 3 (three) times a week.   . clopidogrel (PLAVIX) 75 MG tablet Take 1 tablet (75 mg total) by mouth daily with breakfast.  . febuxostat (ULORIC) 40 MG tablet Take 1 tablet (40 mg total) by mouth daily.  Marland Kitchen FLUoxetine (PROZAC) 20 MG capsule Take 1 capsule (20 mg total) by mouth daily.  . furosemide (LASIX) 20 MG tablet Take 1 tablet (20 mg total) by mouth daily for 30 days. (Patient taking differently: Take 20 mg by mouth daily. Daily)  . LORazepam (ATIVAN) 1 MG tablet  Take 0.5-1 tablets (0.5-1 mg total) by mouth daily as needed for anxiety.  Marland Kitchen losartan (COZAAR) 100 MG tablet Take 1 tablet (100 mg total) by mouth daily.  . metoprolol tartrate (LOPRESSOR) 25 MG tablet Take 1 tablet (25 mg total) by mouth 2 (two) times daily.  . Multiple Vitamin (MULTIVITAMIN) tablet Take 1 tablet by mouth daily.  . Multiple Vitamins-Minerals (EMERGEN-C IMMUNE PO) Take 1 tablet by mouth every other day.   . pantoprazole (PROTONIX) 20 MG tablet Take 1 tablet (20 mg total) by mouth 2 (two) times daily.  . [DISCONTINUED] benzonatate (TESSALON) 200 MG capsule Take 1 capsule (200 mg total) by mouth 3 (three) times daily as needed. (Patient not taking: Reported on 06/04/2019)   No facility-administered encounter medications on file as of 06/04/2019.     Activities of Daily Living In your present state of health, do you have any difficulty performing the following activities: 06/04/2019 02/04/2019  Hearing? N -  Vision? N -  Difficulty concentrating or making decisions? N -  Walking or climbing stairs? N -  Dressing or bathing? N -  Doing errands, shopping? N N  Preparing Food and eating ? N -  Using the Toilet? N -  In the past six months, have you accidently leaked urine? N -  Do you have problems with loss of bowel control? N -  Managing your Medications? N -  Managing your Finances? N -  Housekeeping or managing your Housekeeping? N -  Some recent data might be hidden    Patient Care Team: [name], MD as PCP - General (Family Medicine) End, Harrell Gave, MD as PCP - Cardiology (Cardiology) Lollie Sails, MD as Consulting Physician (Gastroenterology) Minor, Dalbert Garnet, RN as [place] Management   Assessment:   This is a routine wellness examination for [place].  Exercise Activities and Dietary recommendations Current Exercise Habits: Home exercise routine, Type of exercise: Other - see comments(bicycle), Time (Minutes): 60, Frequency  (Times/Week): 4, Weekly Exercise (Minutes/Week): 240, Intensity: Moderate, Exercise limited by: None identified  Goals    . DIET - INCREASE WATER INTAKE     Recommend dirnking at least 6-8 glasses  of water a day     . Increase water intake     Recommend drinking at least 4-5 glasses of water a day       Fall Risk: Fall Risk  06/04/2019 02/13/2019 12/30/2018 06/26/2018 05/23/2018  Falls in the past year? 0 0 0 No No  Injury with Fall? - 0 - - -  Follow up - - Falls evaluation completed - -    FALL RISK PREVENTION PERTAINING TO THE HOME:  Any stairs in or around the home? Yes  If so, are there any without handrails? No   Home free of loose throw rugs in walkways, pet beds, electrical cords, etc? No  dogs and cats Adequate lighting in your home to reduce risk of falls? Yes   ASSISTIVE DEVICES UTILIZED TO PREVENT FALLS:  Life alert? No  Use of a cane, walker or w/c? No  Grab bars in the bathroom? No  Shower chair or bench in shower? No  Elevated toilet seat or a handicapped toilet? No   TIMED UP AND GO:  Was the test performed? Yes .  Length of time to ambulate 10 feet: 10 sec.   GAIT:  Appearance of gait: Gait steady and fast without the use of an assistive device.  Education: Fall risk prevention has been discussed.  Intervention(s) required? No  DME/home health order needed?  No   Depression Screen PHQ 2/9 Scores 06/04/2019 02/13/2019 02/12/2019 05/23/2018  PHQ - 2 Score 1 0 0 1  PHQ- 9 Score - 1 1 5     Cognitive Function     6CIT Screen 06/04/2019 05/23/2018 05/18/2017  What Year? 0 points 0 points 0 points  What month? 0 points 0 points 0 points  What time? 0 points 0 points 0 points  Count back from 20 0 points 0 points 0 points  Months in reverse 0 points 0 points 0 points  Repeat phrase 0 points 0 points 0 points  Total Score 0 0 0    Immunization History  Administered Date(s) Administered  . Influenza, High Dose Seasonal PF 11/29/2016, 10/11/2017, 10/28/2018   . Influenza,inj,Quad PF,6+ Mos 11/22/2015  . Influenza-Unspecified 11/10/2014  . Pneumococcal Conjugate-13 05/12/2014  . Pneumococcal Polysaccharide-23 04/11/2010  . Td 05/12/2014  . Zoster 10/08/2008    Qualifies for Shingles Vaccine? Yes  Zostavax completed 10/08/2008. Due for Shingrix. Education has been provided regarding the importance of this vaccine. Pt has been advised to call insurance company to determine out of pocket expense. Advised may also receive vaccine at local pharmacy or Health Dept. Verbalized acceptance and understanding.  Tdap: up to date   Flu Vaccine: up to date   Pneumococcal Vaccine: up to date   Screening Tests Health Maintenance  Topic Date Due  . INFLUENZA VACCINE  07/12/2019  . COLONOSCOPY  12/19/2021  . TETANUS/TDAP  05/12/2024  . Hepatitis C Screening  Completed  . PNA vac Low Risk Adult  Completed   Cancer Screenings:  Colorectal Screening: Completed 12/19/2018. Repeat every 3 years  Lung Cancer Screening: (Low Dose CT Chest recommended if Age 30-80 years, 30 pack-year currently smoking OR have quit w/in 15years.) does not qualify.    Additional Screening:  Hepatitis C Screening: does qualify; Completed 10/30/2018  Vision Screening: Recommended annual ophthalmology exams for early detection of glaucoma and other disorders of the eye. Is the patient up to date with their annual eye exam?  Yes  Who is the provider or what is the name of the office in  which the pt attends annual eye exams? [facility]    Dental Screening: Recommended annual dental exams for proper oral hygiene  Community Resource Referral:  CRR required this visit?  No        Plan:  I have personally reviewed and addressed the Medicare Annual Wellness questionnaire and have noted the following in the patient's chart:  A. Medical and social history B. Use of alcohol, tobacco or illicit drugs  C. Current medications and supplements D. Functional ability and  status E.  Nutritional status F.  Physical activity G. Advance directives H. List of other physicians I.  Hospitalizations, surgeries, and ER visits in previous 12 months J.  [place] such as hearing and vision if needed, cognitive and depression L. Referrals and appointments   In addition, I have reviewed and discussed with patient certain preventive protocols, quality metrics, and best practice recommendations. A written personalized care plan for preventive services as well as general preventive health recommendations were provided to patient.   Signed,   Bevelyn Ngo, LPN  08/25/568 Nurse Health Advisor   Nurse Notes: none

## 2019-06-10 ENCOUNTER — Other Ambulatory Visit: Payer: Self-pay | Admitting: *Deleted

## 2019-06-10 DIAGNOSIS — C884 Extranodal marginal zone B-cell lymphoma of mucosa-associated lymphoid tissue [MALT-lymphoma]: Secondary | ICD-10-CM

## 2019-06-11 ENCOUNTER — Telehealth: Payer: Medicare Other

## 2019-06-12 ENCOUNTER — Inpatient Hospital Stay (HOSPITAL_BASED_OUTPATIENT_CLINIC_OR_DEPARTMENT_OTHER): Payer: Medicare Other | Admitting: Internal Medicine

## 2019-06-12 ENCOUNTER — Other Ambulatory Visit: Payer: Self-pay

## 2019-06-12 ENCOUNTER — Inpatient Hospital Stay: Payer: Medicare Other | Attending: Internal Medicine

## 2019-06-12 DIAGNOSIS — C884 Extranodal marginal zone B-cell lymphoma of mucosa-associated lymphoid tissue [MALT-lymphoma]: Secondary | ICD-10-CM

## 2019-06-12 DIAGNOSIS — I2510 Atherosclerotic heart disease of native coronary artery without angina pectoris: Secondary | ICD-10-CM | POA: Insufficient documentation

## 2019-06-12 DIAGNOSIS — Z8052 Family history of malignant neoplasm of bladder: Secondary | ICD-10-CM

## 2019-06-12 DIAGNOSIS — Z8051 Family history of malignant neoplasm of kidney: Secondary | ICD-10-CM | POA: Insufficient documentation

## 2019-06-12 DIAGNOSIS — Z8572 Personal history of non-Hodgkin lymphomas: Secondary | ICD-10-CM

## 2019-06-12 DIAGNOSIS — R748 Abnormal levels of other serum enzymes: Secondary | ICD-10-CM

## 2019-06-12 DIAGNOSIS — K760 Fatty (change of) liver, not elsewhere classified: Secondary | ICD-10-CM | POA: Insufficient documentation

## 2019-06-12 DIAGNOSIS — Z8042 Family history of malignant neoplasm of prostate: Secondary | ICD-10-CM | POA: Insufficient documentation

## 2019-06-12 LAB — CBC WITH DIFFERENTIAL/PLATELET
Abs Immature Granulocytes: 0.05 10*3/uL (ref 0.00–0.07)
Basophils Absolute: 0.1 10*3/uL (ref 0.0–0.1)
Basophils Relative: 1 %
Eosinophils Absolute: 0.1 10*3/uL (ref 0.0–0.5)
Eosinophils Relative: 1 %
HCT: 40.3 % (ref 39.0–52.0)
Hemoglobin: 14.1 g/dL (ref 13.0–17.0)
Immature Granulocytes: 1 %
Lymphocytes Relative: 37 %
Lymphs Abs: 2.5 10*3/uL (ref 0.7–4.0)
MCH: 34.4 pg — ABNORMAL HIGH (ref 26.0–34.0)
MCHC: 35 g/dL (ref 30.0–36.0)
MCV: 98.3 fL (ref 80.0–100.0)
Monocytes Absolute: 0.9 10*3/uL (ref 0.1–1.0)
Monocytes Relative: 13 %
Neutro Abs: 3.1 10*3/uL (ref 1.7–7.7)
Neutrophils Relative %: 47 %
Platelets: 235 10*3/uL (ref 150–400)
RBC: 4.1 MIL/uL — ABNORMAL LOW (ref 4.22–5.81)
RDW: 12.9 % (ref 11.5–15.5)
WBC: 6.7 10*3/uL (ref 4.0–10.5)
nRBC: 0 % (ref 0.0–0.2)

## 2019-06-12 LAB — COMPREHENSIVE METABOLIC PANEL
ALT: 31 U/L (ref 0–44)
AST: 35 U/L (ref 15–41)
Albumin: 4.4 g/dL (ref 3.5–5.0)
Alkaline Phosphatase: 66 U/L (ref 38–126)
Anion gap: 10 (ref 5–15)
BUN: 19 mg/dL (ref 8–23)
CO2: 29 mmol/L (ref 22–32)
Calcium: 9.3 mg/dL (ref 8.9–10.3)
Chloride: 100 mmol/L (ref 98–111)
Creatinine, Ser: 0.96 mg/dL (ref 0.61–1.24)
GFR calc Af Amer: >60 mL/min (ref >60)
GFR calc non Af Amer: >60 mL/min (ref >60)
Glucose, Bld: 119 mg/dL — ABNORMAL HIGH (ref 70–99)
Potassium: 4.2 mmol/L (ref 3.5–5.1)
Sodium: 139 mmol/L (ref 135–145)
Total Bilirubin: 1.2 mg/dL (ref 0.3–1.2)
Total Protein: 7.6 g/dL (ref 6.5–8.1)

## 2019-06-12 LAB — LACTATE DEHYDROGENASE: LDH: 180 U/L (ref 98–192)

## 2019-06-12 NOTE — Progress Notes (Signed)
[facility] NOTE  Patient Care Team: [name], MD as PCP - General (Family Medicine) End, Harrell Gave, MD as PCP - Cardiology (Cardiology) Lollie Sails, MD as Consulting Physician (Gastroenterology) Minor, Dalbert Garnet, RN as [place] Management  CHIEF COMPLAINTS/PURPOSE OF CONSULTATION:  Lymphoma  #  Oncology History Overview Note  # NOV 2019- [place] [II opinion at [facility] path]; s/p sigmoid colon polyp resection [Dr.Skulskie; incidental]; PET scan -NED  # Barrett's esophagus- 2018;#Colonic polyps surveillance/Dr. 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  male is here for follow-up of his MALT lymphoma of the colon/polyp status post resection  The interim patient underwent a cardiac cath and stent placement.   Patient denies any new lumps or bumps.  Appetite is good.  No weight loss.  No night sweats.  Fever chills or cough.   Review of Systems  Constitutional: Negative for chills, diaphoresis, fever, malaise/fatigue and weight loss.  HENT: Negative for nosebleeds and sore throat.   Eyes: Negative for double vision.  Respiratory: Negative for cough, hemoptysis, sputum production, shortness of breath and wheezing.   Cardiovascular: Negative for chest pain, palpitations, orthopnea and leg swelling.  Gastrointestinal: Negative for abdominal pain, blood in stool, constipation, diarrhea, heartburn, melena, nausea and vomiting.  Genitourinary: Negative for dysuria, frequency and  urgency.  Musculoskeletal: Negative for back pain and joint pain.  Skin: Negative.  Negative for itching and rash.  Neurological: Negative for dizziness, tingling, focal weakness, weakness and headaches.  Endo/Heme/Allergies: Does not bruise/bleed easily.  Psychiatric/Behavioral: Negative for depression. The patient is not nervous/anxious and does not have insomnia.      MEDICAL HISTORY:  Past Medical History:  Diagnosis Date  . (HFpEF) heart failure with preserved ejection fraction ([place])    a. 01/2019 Cath: EF 55-60% w/ elevated LVEDP.  Marland Kitchen Anxiety   . Barrett's esophagus   . CAD (coronary artery disease)    a. 10/2018 ETT: 16mm horizontal inflat ST depression, freq PVC's->Intermediate; b. 12/2018 Cardiac CTA: LM nl, LAD FFRct 0.6 in mLAD, LCX FFRct 0.99p, 0.6m, 0.77d, RCA no signif dzs, FFRct 0.89d->Rec cath; c. 01/2019 PCI: LM nl, LAD 40p, 39m, D2 70, LCX 70p (2.75x15 Resolute Onyx DES), RCA min irregs. EF 55-65%. EDP 25-67mmHg; d. 01/2019 PCI: LAD 80-90 (atherectomy & 3x15 Resolute Onyx DES).  . Colon polyps   . Duodenal adenoma   . Gastritis   . GERD (gastroesophageal reflux disease)   . Gout   . H. pylori infection   . H/O urethral stricture   . Hemorrhoids   . Hiatal hernia   . Hypertension   . Stomach ulcer     SURGICAL HISTORY: Past Surgical History:  Procedure Laterality Date  . COLONOSCOPY    . COLONOSCOPY WITH PROPOFOL N/A 08/20/2018   Procedure: COLONOSCOPY WITH PROPOFOL;  Surgeon: Lollie Sails, MD;  Location: [facility] ENDOSCOPY;  Service: Endoscopy;  Laterality: N/A;  . COLONOSCOPY WITH PROPOFOL N/A 12/19/2018   Procedure: COLONOSCOPY WITH PROPOFOL;  Surgeon: Loistine Simas  U, MD;  Location: [facility] ENDOSCOPY;  Service: Endoscopy;  Laterality: N/A;  . CORONARY ATHERECTOMY N/A 02/03/2019   Procedure: CORONARY ATHERECTOMY;  Surgeon: Nelva Bush, MD;  Location: [facility] CV LAB;  Service: Cardiovascular;  Laterality: N/A;  . CORONARY STENT INTERVENTION N/A 01/21/2019    Procedure: CORONARY STENT INTERVENTION;  Surgeon: Nelva Bush, MD;  Location: [place] CV LAB;  Service: Cardiovascular;  Laterality: N/A;  . CORONARY STENT INTERVENTION N/A 02/03/2019   Procedure: CORONARY STENT INTERVENTION;  Surgeon: Nelva Bush, MD;  Location: [place] CV LAB;  Service: Cardiovascular;  Laterality: N/A;  . CYSTOSCOPY WITH URETHRAL DILATATION N/A 08/16/2018   Procedure: CYSTOSCOPY WITH URETHRAL DILATATION;  Surgeon: [name], MD;  Location: [facility] ORS;  Service: Urology;  Laterality: N/A;  . ESOPHAGOGASTRODUODENOSCOPY N/A 01/29/2017   Procedure: ESOPHAGOGASTRODUODENOSCOPY (EGD);  Surgeon: Lollie Sails, MD;  Location: [facility] ENDOSCOPY;  Service: Endoscopy;  Laterality: N/A;  . ESOPHAGOGASTRODUODENOSCOPY (EGD) WITH PROPOFOL N/A 07/10/2016   Procedure: ESOPHAGOGASTRODUODENOSCOPY (EGD) WITH PROPOFOL;  Surgeon: Lollie Sails, MD;  Location: [facility] ENDOSCOPY;  Service: Endoscopy;  Laterality: N/A;  . ESOPHAGOGASTRODUODENOSCOPY (EGD) WITH PROPOFOL N/A 12/19/2018   Procedure: ESOPHAGOGASTRODUODENOSCOPY (EGD) WITH PROPOFOL;  Surgeon: Lollie Sails, MD;  Location: [facility] ENDOSCOPY;  Service: Endoscopy;  Laterality: N/A;  . gastritis    . h.pylori    . INTRAVASCULAR ULTRASOUND/IVUS N/A 02/03/2019   Procedure: Intravascular Ultrasound/IVUS;  Surgeon: Nelva Bush, MD;  Location: [place] CV LAB;  Service: Cardiovascular;  Laterality: N/A;  . left elbow repair Left   . LEFT HEART CATH AND CORONARY ANGIOGRAPHY Left 01/21/2019   Procedure: LEFT HEART CATH AND CORONARY ANGIOGRAPHY;  Surgeon: Nelva Bush, MD;  Location: [place] CV LAB;  Service: Cardiovascular;  Laterality: Left;  . SPLENECTOMY  1957  . TONSILLECTOMY      SOCIAL HISTORY: Social History   Socioeconomic History  . Marital status: Married    Spouse name: Not on file  . Number of children: Not on file  . Years of education: Not on file  . Highest education level: Master's  degree (e.g., MA, MS, MEng, MEd, MSW, MBA)  Occupational History  . Occupation: retired   Scientific laboratory technician  . Financial resource strain: Not hard at all  . Food insecurity    Worry: Never true    Inability: Never true  . Transportation needs    Medical: No    Non-medical: No  Tobacco Use  . Smoking status: Never Smoker  . Smokeless tobacco: Never Used  Substance and Sexual Activity  . Alcohol use: Yes    Alcohol/week: 5.0 standard drinks    Types: 5 Glasses of wine per week    Comment: OCCASIONAL  . Drug use: No  . Sexual activity: Not on file  Lifestyle  . Physical activity    Days per week: 4 days    Minutes per session: 60 min  . Stress: Not at all  Relationships  . Social connections    Talks on phone: More than three times a week    Gets together: More than three times a week    Attends religious service: More than 4 times per year    Active member of club or organization: Yes    Attends meetings of clubs or organizations: More than 4 times per year    Relationship status: Married  . Intimate partner violence    Fear of current or ex partner: No    Emotionally abused: No    Physically  abused: No    Forced sexual activity: No  Other Topics Concern  . Not on file  Social History Narrative   Rides bicycle with friends       No smoking.  Occasional alcohol.  Lives at home.  Retired Customer service manager.    FAMILY HISTORY: Family History  Adopted: Yes  Problem Relation Age of Onset  . Heart attack Mother 4  . Pancreatic cancer Father        died in WWII - pt adopted  . Hypertension Sister   . Hyperlipidemia Sister   . Hypertension Brother   . Hyperlipidemia Brother   . Prostate cancer Neg Hx   . Kidney cancer Neg Hx   . Bladder Cancer Neg Hx     ALLERGIES:  has No Known Allergies.  MEDICATIONS:  Current Outpatient Medications  Medication Sig Dispense Refill  . amLODipine (NORVASC) 10 MG tablet Take 1 tablet (10 mg total) by mouth daily. 90 tablet 4  . aspirin EC 81  MG tablet Take 81 mg by mouth daily.    Marland Kitchen atorvastatin (LIPITOR) 40 MG tablet Take 1 tablet (40 mg total) by mouth daily. 90 tablet 2  . Bioflavonoid Products (BIOFLEX PO) Take 1 tablet by mouth 3 (three) times a week.     . clopidogrel (PLAVIX) 75 MG tablet Take 1 tablet (75 mg total) by mouth daily with breakfast. 90 tablet 3  . febuxostat (ULORIC) 40 MG tablet Take 1 tablet (40 mg total) by mouth daily. 90 tablet 4  . FLUoxetine (PROZAC) 20 MG capsule Take 1 capsule (20 mg total) by mouth daily. 90 capsule 3  . LORazepam (ATIVAN) 1 MG tablet Take 0.5-1 tablets (0.5-1 mg total) by mouth daily as needed for anxiety. 30 tablet 1  . losartan (COZAAR) 100 MG tablet Take 1 tablet (100 mg total) by mouth daily. 90 tablet 3  . metoprolol tartrate (LOPRESSOR) 25 MG tablet Take 1 tablet (25 mg total) by mouth 2 (two) times daily. 180 tablet 3  . Multiple Vitamin (MULTIVITAMIN) tablet Take 1 tablet by mouth daily.    . Multiple Vitamins-Minerals (EMERGEN-C IMMUNE PO) Take 1 tablet by mouth every other day.     . pantoprazole (PROTONIX) 20 MG tablet Take 1 tablet (20 mg total) by mouth 2 (two) times daily. 180 tablet 4  . furosemide (LASIX) 20 MG tablet Take 1 tablet (20 mg total) by mouth daily for 30 days. (Patient taking differently: Take 20 mg by mouth daily. Daily) 90 tablet 3   No current facility-administered medications for this visit.       Marland Kitchen  PHYSICAL EXAMINATION: ECOG PERFORMANCE STATUS: 0 - Asymptomatic  Vitals:   06/12/19 1054  BP: (!) 145/89  Pulse: 70  Resp: 18  Temp: (!) 97.2 F (36.2 C)   Filed Weights   06/12/19 1054  Weight: 261 lb 8 oz (118.6 kg)    Physical Exam  Constitutional: He is oriented to person, place, and time and well-developed, well-nourished, and in no distress.  HENT:  Head: Normocephalic and atraumatic.  Mouth/Throat: Oropharynx is clear and moist. No oropharyngeal exudate.  Eyes: Pupils are equal, round, and reactive to light.  Neck: Normal range  of motion. Neck supple.  Cardiovascular: Normal rate and regular rhythm.  Pulmonary/Chest: No respiratory distress. He has no wheezes.  Abdominal: Soft. Bowel sounds are normal. He exhibits no distension and no mass. There is no abdominal tenderness. There is no rebound and no guarding.  Musculoskeletal: Normal range of motion.  General: No tenderness or edema.  Neurological: He is alert and oriented to person, place, and time.  Skin: Skin is warm.  Psychiatric: Affect normal.     LABORATORY DATA:  I have reviewed the data as listed Lab Results  Component Value Date   WBC 6.7 06/12/2019   HGB 14.1 06/12/2019   HCT 40.3 06/12/2019   MCV 98.3 06/12/2019   PLT 235 06/12/2019   Recent Labs    06/26/18 1121  10/30/18 1608  01/28/19 1042 02/03/19 1406 02/04/19 0529 02/12/19 1350 06/12/19 1022  NA 142   < > 136   < > 139  --  138  --  139  K 3.8   < > 3.6   < > 4.5  --  4.1  --  4.2  CL 99   < > 101   < > 100  --  104  --  100  CO2 28   < > 25   < > 24  --  26  --  29  GLUCOSE 117*   < > 100*   < > 108*  --  112*  --  119*  BUN 16   < > 21   < > 18  --  14  --  19  CREATININE 1.09   < > 0.96   < > 1.13 0.90 0.96  --  0.96  CALCIUM 9.9   < > 9.5   < > 9.6  --  9.0  --  9.3  GFRNONAA 67   < > >60   < > 64 >60 >60  --  >60  GFRAA 77   < > >60   < > 74 >60 >60  --  >60  PROT 6.6  --  7.7  --   --   --   --   --  7.6  ALBUMIN 4.3  --  4.6  --   --   --   --   --  4.4  AST 37  --  48*  --   --   --   --  38 35  ALT 41  --  54*  --   --   --   --  30 31  ALKPHOS 58  --  58  --   --   --   --   --  66  BILITOT 0.7  --  0.9  --   --   --   --   --  1.2   < > = values in this interval not displayed.    RADIOGRAPHIC STUDIES: I have personally reviewed the radiological images as listed and agreed with the findings in the report. No results found.  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  Stable.  #No clinical evidence of recurrence.  Discussed surveillance; given the low risk of recurrence I would recommend clinical surveillance; may be repeat imaging in the next 1 year or so.  Patient agreement.  #Mild elevation of LFTs AST ALT likely secondary to fatty liver; stable.  #Multiple colon polyps/Barrett's surveillance Dr. Gustavo Lah- last EGD/[place]- Jan 2020.  Reviewed the EGD/pathology stable.  # CAD s/p stenting [Dr.End ]-stable.  # DISPOSITION: # follow up in 6 months- MD/labs- cbc/cmp/ldh-Dr.B      All questions were answered. The patient knows to call the clinic  with any problems, questions or concerns.       Cammie Sickle, MD 06/24/2019 8:21 PM

## 2019-06-12 NOTE — Progress Notes (Signed)
Patient does not offer any problems today.  

## 2019-06-12 NOTE — Assessment & Plan Note (Addendum)
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  Stable.  #No clinical evidence of recurrence.  Discussed surveillance; given the low risk of recurrence I would recommend clinical surveillance; may be repeat imaging in the next 1 year or so.  Patient agreement.  #Mild elevation of LFTs AST ALT likely secondary to fatty liver; stable.  #Multiple colon polyps/Barrett's surveillance Dr. Gustavo Lah- last EGD/[place]- Jan 2020.  Reviewed the EGD/pathology stable.  # CAD s/p stenting [Dr.End ]-stable.  # DISPOSITION: # follow up in 6 months- MD/labs- cbc/cmp/ldh-Dr.B

## 2019-06-25 ENCOUNTER — Encounter: Payer: Self-pay | Admitting: *Deleted

## 2019-06-25 DIAGNOSIS — Z955 Presence of coronary angioplasty implant and graft: Secondary | ICD-10-CM

## 2019-06-25 NOTE — Progress Notes (Signed)
Cardiac Individual Treatment Plan  Patient Details  Name: Jesse Norton MRN: [id number] Date of Birth: December 21, 1945 Referring Provider:     Cardiac Rehab from 02/13/2019 in [facility]  Referring Provider  End      Initial Encounter Date:    Cardiac Rehab from 02/13/2019 in [facility]  Date  02/13/19      Visit Diagnosis: Status post coronary artery stent placement  Patient's Home Medications on Admission:  Current Outpatient Medications:  .  amLODipine (NORVASC) 10 MG tablet, Take 1 tablet (10 mg total) by mouth daily., Disp: 90 tablet, Rfl: 4 .  aspirin EC 81 MG tablet, Take 81 mg by mouth daily., Disp: , Rfl:  .  atorvastatin (LIPITOR) 40 MG tablet, Take 1 tablet (40 mg total) by mouth daily., Disp: 90 tablet, Rfl: 2 .  Bioflavonoid Products (BIOFLEX PO), Take 1 tablet by mouth 3 (three) times a week. , Disp: , Rfl:  .  clopidogrel (PLAVIX) 75 MG tablet, Take 1 tablet (75 mg total) by mouth daily with breakfast., Disp: 90 tablet, Rfl: 3 .  febuxostat (ULORIC) 40 MG tablet, Take 1 tablet (40 mg total) by mouth daily., Disp: 90 tablet, Rfl: 4 .  FLUoxetine (PROZAC) 20 MG capsule, Take 1 capsule (20 mg total) by mouth daily., Disp: 90 capsule, Rfl: 3 .  furosemide (LASIX) 20 MG tablet, Take 1 tablet (20 mg total) by mouth daily for 30 days. (Patient taking differently: Take 20 mg by mouth daily. Daily), Disp: 90 tablet, Rfl: 3 .  LORazepam (ATIVAN) 1 MG tablet, Take 0.5-1 tablets (0.5-1 mg total) by mouth daily as needed for anxiety., Disp: 30 tablet, Rfl: 1 .  losartan (COZAAR) 100 MG tablet, Take 1 tablet (100 mg total) by mouth daily., Disp: 90 tablet, Rfl: 3 .  metoprolol tartrate (LOPRESSOR) 25 MG tablet, Take 1 tablet (25 mg total) by mouth 2 (two) times daily., Disp: 180 tablet, Rfl: 3 .  Multiple Vitamin (MULTIVITAMIN) tablet, Take 1 tablet by mouth daily., Disp: , Rfl:  .  Multiple Vitamins-Minerals (EMERGEN-C IMMUNE PO), Take 1 tablet  by mouth every other day. , Disp: , Rfl:  .  pantoprazole (PROTONIX) 20 MG tablet, Take 1 tablet (20 mg total) by mouth 2 (two) times daily., Disp: 180 tablet, Rfl: 4  Past Medical History: Past Medical History:  Diagnosis Date  . (HFpEF) heart failure with preserved ejection fraction ([place])    a. 01/2019 Cath: EF 55-60% w/ elevated LVEDP.  Marland Kitchen Anxiety   . Barrett's esophagus   . CAD (coronary artery disease)    a. 10/2018 ETT: 62m horizontal inflat ST depression, freq PVC's->Intermediate; b. 12/2018 Cardiac CTA: LM nl, LAD FFRct 0.6 in mLAD, LCX FFRct 0.99p, 0.829m0.77d, RCA no signif dzs, FFRct 0.89d->Rec cath; c. 01/2019 PCI: LM nl, LAD 40p, 8552m2 70, LCX 70p (2.75x15 Resolute Onyx DES), RCA min irregs. EF 55-65%. EDP 25-28m43m d. 01/2019 PCI: LAD 80-90 (atherectomy & 3x15 Resolute Onyx DES).  . Colon polyps   . Duodenal adenoma   . Gastritis   . GERD (gastroesophageal reflux disease)   . Gout   . H. pylori infection   . H/O urethral stricture   . Hemorrhoids   . Hiatal hernia   . Hypertension   . Stomach ulcer     Tobacco Use: Social History   Tobacco Use  Smoking Status Never Smoker  Smokeless Tobacco Never Used    Labs: Recent Review [employer], physiological  Labs for ITP Cardiac and Pulmonary Rehab Latest Ref Rng & Units 05/25/2015 05/30/2016 06/07/2017 06/26/2018 02/12/2019   Cholestrol <200 mg/dL 186 181 166 169 124   LDLCALC 0 - 99 mg/dL 109(H) 107(H) 85 98 -   HDL >39 mg/dL 62 62 56 59 -   Trlycerides <150 mg/dL 75 61 127 62 58       Exercise Target Goals: Exercise Program Goal: Individual exercise prescription set using results from initial 6 min walk test and THRR while considering  patient's activity barriers and safety.   Exercise Prescription Goal: Initial exercise prescription builds to 30-45 minutes a day of aerobic activity, 2-3 days per week.  Home exercise guidelines will be given to patient during program as part of exercise prescription that the participant will  acknowledge.  Activity Barriers & Risk Stratification: Activity Barriers & Cardiac Risk Stratification - 02/13/19 1432      Activity Barriers & Cardiac Risk Stratification   Activity Barriers  Back Problems    Cardiac Risk Stratification  Moderate       6 Minute Walk: 6 Minute Walk    Row Name 02/13/19 1435         6 Minute Walk   Phase  Initial     Distance  1200 feet     Walk Time  6 minutes     # of Rest Breaks  0     MPH  2.3     METS  2.08     RPE  10     VO2 Peak  7.3     Symptoms  No     Resting HR  60 bpm     Resting BP  132/68     Resting Oxygen Saturation   99 %     Exercise Oxygen Saturation  during 6 min walk  98 %     Max Ex. HR  107 bpm     Max Ex. BP  142/70     2 Minute Post BP  132/76        Oxygen Initial Assessment:   Oxygen Re-Evaluation:   Oxygen Discharge (Final Oxygen Re-Evaluation):   Initial Exercise Prescription: Initial Exercise Prescription - 02/13/19 1400      Date of Initial Exercise RX and Referring Provider   Date  02/13/19    Referring Provider  End      Treadmill   MPH  2.3    Grade  0    Minutes  15    METs  2.7      Recumbant Bike   Level  5    RPM  60    Watts  25    Minutes  15    METs  2.7      Elliptical   Level  1    Speed  2.6    Minutes  15      Prescription Details   Frequency (times per week)  3    Duration  Progress to 45 minutes of aerobic exercise without signs/symptoms of physical distress      Intensity   THRR 40-80% of Max Heartrate  94-129    Ratings of Perceived Exertion  11-13    Perceived Dyspnea  0-4      Resistance Training   Training Prescription  Yes    Weight  4 lb    Reps  10-15       Perform Capillary Blood Glucose checks as needed.  Exercise Prescription Changes: Exercise Prescription  Changes    Row Name 02/13/19 1400             Response to Exercise   Blood Pressure (Admit)  132/68       Blood Pressure (Exercise)  142/70       Blood Pressure (Exit)   132/76       Heart Rate (Admit)  63 bpm       Heart Rate (Exercise)  107 bpm       Heart Rate (Exit)  53 bpm       Oxygen Saturation (Admit)  99 %       Oxygen Saturation (Exit)  98 %       Rating of Perceived Exertion (Exercise)  10          Exercise Comments:   Exercise Goals and Review: Exercise Goals    Row Name 02/13/19 1434             Exercise Goals   Increase Physical Activity  Yes       Intervention  Provide advice, education, support and counseling about physical activity/exercise needs.;Develop an individualized exercise prescription for aerobic and resistive training based on initial evaluation findings, risk stratification, comorbidities and participant's personal goals.       Expected Outcomes  Short Term: Attend rehab on a regular basis to increase amount of physical activity.;Long Term: Add in home exercise to make exercise part of routine and to increase amount of physical activity.;Long Term: Exercising regularly at least 3-5 days a week.       Increase Strength and Stamina  Yes       Intervention  Provide advice, education, support and counseling about physical activity/exercise needs.;Develop an individualized exercise prescription for aerobic and resistive training based on initial evaluation findings, risk stratification, comorbidities and participant's personal goals.       Expected Outcomes  Short Term: Increase workloads from initial exercise prescription for resistance, speed, and METs.;Short Term: Perform resistance training exercises routinely during rehab and add in resistance training at home;Long Term: Improve cardiorespiratory fitness, muscular endurance and strength as measured by increased METs and functional capacity (6MWT)       Able to understand and use rate of perceived exertion (RPE) scale  Yes       Intervention  Provide education and explanation on how to use RPE scale       Expected Outcomes  Short Term: Able to use RPE daily in rehab to  express subjective intensity level;Long Term:  Able to use RPE to guide intensity level when exercising independently       Able to understand and use Dyspnea scale  Yes       Intervention  Provide education and explanation on how to use Dyspnea scale       Expected Outcomes  Short Term: Able to use Dyspnea scale daily in rehab to express subjective sense of shortness of breath during exertion;Long Term: Able to use Dyspnea scale to guide intensity level when exercising independently       Knowledge and understanding of Target Heart Rate Range (THRR)  Yes       Intervention  Provide education and explanation of THRR including how the numbers were predicted and where they are located for reference       Expected Outcomes  Short Term: Able to state/look up THRR;Short Term: Able to use daily as guideline for intensity in rehab;Long Term: Able to use THRR to govern intensity when exercising independently  Able to check pulse independently  Yes       Intervention  Provide education and demonstration on how to check pulse in carotid and radial arteries.;Review the importance of being able to check your own pulse for safety during independent exercise       Expected Outcomes  Short Term: Able to explain why pulse checking is important during independent exercise;Long Term: Able to check pulse independently and accurately       Understanding of Exercise Prescription  Yes       Intervention  Provide education, explanation, and written materials on patient's individual exercise prescription       Expected Outcomes  Short Term: Able to explain program exercise prescription;Long Term: Able to explain home exercise prescription to exercise independently          Exercise Goals Re-Evaluation : Exercise Goals Re-Evaluation    [place] Name 03/21/19 1133             Exercise Goal Re-Evaluation   Exercise Goals Review  Increase Physical Activity;Increase Strength and Stamina       Comments  Dorrian has been  walking 6 days a week 1-2 miles, and riding his bike inside 3 days.  He also does light weight work.   Staff will forward the videos so he has some other strength exercise to follow.  He did have to cut up a tree that fell and could tell he was tired - his son helped.  He is looking forward to starting HT.       Expected Outcomes  Short - continue exercise at home Long - improve MET level          Discharge Exercise Prescription (Final Exercise Prescription Changes): Exercise Prescription Changes - 02/13/19 1400      Response to Exercise   Blood Pressure (Admit)  132/68    Blood Pressure (Exercise)  142/70    Blood Pressure (Exit)  132/76    Heart Rate (Admit)  63 bpm    Heart Rate (Exercise)  107 bpm    Heart Rate (Exit)  53 bpm    Oxygen Saturation (Admit)  99 %    Oxygen Saturation (Exit)  98 %    Rating of Perceived Exertion (Exercise)  10       Nutrition:  Target Goals: Understanding of nutrition guidelines, daily intake of sodium '1500mg'$ , cholesterol '200mg'$ , calories 30% from fat and 7% or less from saturated fats, daily to have 5 or more servings of fruits and vegetables.  Biometrics: Pre Biometrics - 02/13/19 1433      Pre Biometrics   Height  5' 8.9" (1.75 m)    Weight  252 lb 3.2 oz (114.4 kg)    Waist Circumference  48 inches    Hip Circumference  44.25 inches    Waist to Hip Ratio  1.08 %    BMI (Calculated)  37.35    Single Leg Stand  22.5 seconds        Nutrition Therapy Plan and Nutrition Goals: Nutrition Therapy & Goals - 02/13/19 1420      Intervention Plan   Intervention  Prescribe, educate and counsel regarding individualized specific dietary modifications aiming towards targeted core components such as weight, hypertension, lipid management, diabetes, heart failure and other comorbidities.;Nutrition handout(s) given to patient.    Expected Outcomes  Short Term Goal: Understand basic principles of dietary content, such as calories, fat, sodium,  cholesterol and nutrients.;Short Term Goal: A plan has been developed with personal  nutrition goals set during dietitian appointment.;Long Term Goal: Adherence to prescribed nutrition plan.       Nutrition Assessments: Nutrition Assessments - 02/13/19 1420      MEDFICTS Scores   Pre Score  78       Nutrition Goals Re-Evaluation:   Nutrition Goals Discharge (Final Nutrition Goals Re-Evaluation):   Psychosocial: Target Goals: Acknowledge presence or absence of significant depression and/or stress, maximize coping skills, provide positive support system. Participant is able to verbalize types and ability to use techniques and skills needed for reducing stress and depression.   Initial Review & Psychosocial Screening: Initial Psych Review & Screening - 02/13/19 1411      Initial Review   Current issues with  Current Stress Concerns    Comments  Gaines did not go into detail about his stressors during orientation, but did say it has gotten better since his MD put him on Prozac in January. One of his top three goals is to learn how to manage stress better      [place]?  Yes   spouse, son, friend     Barriers   Psychosocial barriers to participate in program  There are no identifiable barriers or psychosocial needs.;The patient should benefit from training in stress management and relaxation.      Screening Interventions   Interventions  Encouraged to exercise;Program counselor consult;Provide feedback about the scores to participant;To provide support and resources with identified psychosocial needs    Expected Outcomes  Short Term goal: Utilizing psychosocial counselor, staff and physician to assist with identification of specific Stressors or current issues interfering with healing process. Setting desired goal for each stressor or current issue identified.;Long Term Goal: Stressors or current issues are controlled or eliminated.;Short Term goal:  Identification and review with participant of any Quality of Life or Depression concerns found by scoring the questionnaire.;Long Term goal: The participant improves quality of Life and PHQ9 Scores as seen by post scores and/or verbalization of changes       Quality of Life Scores:  Quality of Life - 02/13/19 1420      Quality of Life   Select  Quality of Life      Quality of Life Scores   Health/Function Pre  25.27 %    Socioeconomic Pre  30 %    Psych/Spiritual Pre  25.79 %    Family Pre  25.6 %    GLOBAL Pre  26.4 %      Scores of 19 and below usually indicate a poorer quality of life in these areas.  A difference of  2-3 points is a clinically meaningful difference.  A difference of 2-3 points in the total score of the Quality of Life Index has been associated with significant improvement in overall quality of life, self-image, physical symptoms, and general health in studies assessing change in quality of life.  PHQ-9: Recent Review Flowsheet Data    Depression screen [facility] 2/9 06/04/2019 02/13/2019 02/12/2019 05/23/2018 12/25/2017   Decreased Interest 0 0 0 0 0   Down, Depressed, Hopeless 1 0 0 1 0   PHQ - 2 Score 1 0 0 1 0   Altered sleeping - '1 1 1 '$ -   Tired, decreased energy - 0 0 1 -   Change in appetite - 0 0 0 -   Feeling bad or failure about yourself  - 0 0 0 -   Trouble concentrating - 0 0 1 -  Moving slowly or fidgety/restless - 0 0 1 -   Suicidal thoughts - 0 0 0 -   PHQ-9 Score - '1 1 5 '$ -   Difficult doing work/chores - Not difficult at all Not difficult at all Not difficult at all -     Interpretation of Total Score  Total Score Depression Severity:  1-4 = Minimal depression, 5-9 = Mild depression, 10-14 = Moderate depression, 15-19 = Moderately severe depression, 20-27 = Severe depression   Psychosocial Evaluation and Intervention:   Psychosocial Re-Evaluation:   Psychosocial Discharge (Final Psychosocial Re-Evaluation):   Vocational  Rehabilitation: Provide vocational rehab assistance to qualifying candidates.   Vocational Rehab Evaluation & Intervention: Vocational Rehab - 02/13/19 1410      Initial Vocational Rehab Evaluation & Intervention   Assessment shows need for Vocational Rehabilitation  No       Education: Education Goals: Education classes will be provided on a variety of topics geared toward better understanding of heart health and risk factor modification. Participant will state understanding/return demonstration of topics presented as noted by education test scores.  Learning Barriers/Preferences: Learning Barriers/Preferences - 02/13/19 1407      Learning Barriers/Preferences   Learning Barriers  None    Learning Preferences  Group Instruction;Pictoral;Individual Instruction;Verbal Instruction;Skilled Demonstration       Education Topics:  AED/CPR: - Group verbal and written instruction with the use of models to demonstrate the basic use of the AED with the basic ABC's of resuscitation.   General Nutrition Guidelines/Fats and Fiber: -Group instruction provided by verbal, written material, models and posters to present the general guidelines for heart healthy nutrition. Gives an explanation and review of dietary fats and fiber.   Controlling Sodium/Reading Food Labels: -Group verbal and written material supporting the discussion of sodium use in heart healthy nutrition. Review and explanation with models, verbal and written materials for utilization of the food label.   Exercise Physiology & General Exercise Guidelines: - Group verbal and written instruction with models to review the exercise physiology of the cardiovascular system and associated critical values. Provides general exercise guidelines with specific guidelines to those with heart or lung disease.    Aerobic Exercise & Resistance Training: - Gives group verbal and written instruction on the various components of exercise.  Focuses on aerobic and resistive training programs and the benefits of this training and how to safely progress through these programs..   Flexibility, Balance, Mind/Body Relaxation: Provides group verbal/written instruction on the benefits of flexibility and balance training, including mind/body exercise modes such as yoga, pilates and tai chi.  Demonstration and skill practice provided.   Stress and Anxiety: - Provides group verbal and written instruction about the health risks of elevated stress and causes of high stress.  Discuss the correlation between heart/lung disease and anxiety and treatment options. Review healthy ways to manage with stress and anxiety.   Depression: - Provides group verbal and written instruction on the correlation between heart/lung disease and depressed mood, treatment options, and the stigmas associated with seeking treatment.   Anatomy & Physiology of the Heart: - Group verbal and written instruction and models provide basic cardiac anatomy and physiology, with the coronary electrical and arterial systems. Review of Valvular disease and Heart Failure   Cardiac Procedures: - Group verbal and written instruction to review commonly prescribed medications for heart disease. Reviews the medication, class of the drug, and side effects. Includes the steps to properly store meds and maintain the prescription regimen. (beta blockers and nitrates)  Cardiac Medications I: - Group verbal and written instruction to review commonly prescribed medications for heart disease. Reviews the medication, class of the drug, and side effects. Includes the steps to properly store meds and maintain the prescription regimen.   Cardiac Medications II: -Group verbal and written instruction to review commonly prescribed medications for heart disease. Reviews the medication, class of the drug, and side effects. (all other drug classes)    Go Sex-Intimacy & Heart Disease, Get SMART  - Goal Setting: - Group verbal and written instruction through game format to discuss heart disease and the return to sexual intimacy. Provides group verbal and written material to discuss and apply goal setting through the application of the S.M.A.R.T. Method.   Other Matters of the Heart: - Provides group verbal, written materials and models to describe Stable Angina and Peripheral Artery. Includes description of the disease process and treatment options available to the cardiac patient.   Exercise & Equipment Safety: - Individual verbal instruction and demonstration of equipment use and safety with use of the equipment.   Cardiac Rehab from 02/13/2019 in [facility] Cardiac and Pulmonary Rehab  Date  02/13/19  Educator  [facility]  Instruction Review Code  1- Verbalizes Understanding      Infection Prevention: - Provides verbal and written material to individual with discussion of infection control including proper hand washing and proper equipment cleaning during exercise session.   Cardiac Rehab from 02/13/2019 in [facility] Cardiac and Pulmonary Rehab  Date  02/13/19  Educator  [facility]  Instruction Review Code  1- Verbalizes Understanding      Falls Prevention: - Provides verbal and written material to individual with discussion of falls prevention and safety.   Cardiac Rehab from 02/13/2019 in [facility] Cardiac and Pulmonary Rehab  Date  02/13/19  Educator  [facility]  Instruction Review Code  1- Verbalizes Understanding      Diabetes: - Individual verbal and written instruction to review signs/symptoms of diabetes, desired ranges of glucose level fasting, after meals and with exercise. Acknowledge that pre and post exercise glucose checks will be done for 3 sessions at entry of program.   Know Your Numbers and Risk Factors: -Group verbal and written instruction about important numbers in your health.  Discussion of what are risk factors and how they play a role in the disease process.  Review of Cholesterol,  Blood Pressure, Diabetes, and BMI and the role they play in your overall health.   Sleep Hygiene: -Provides group verbal and written instruction about how sleep can affect your health.  Define sleep hygiene, discuss sleep cycles and impact of sleep habits. Review good sleep hygiene tips.    Other: -Provides group and verbal instruction on various topics (see comments)   Knowledge Questionnaire Score: Knowledge Questionnaire Score - 02/13/19 1409      Knowledge Questionnaire Score   Pre Score  24/26   correct answers reviewed with Mamadou. Focus on nutrition       Core Components/Risk Factors/Patient Goals at Admission: Personal Goals and Risk Factors at Admission - 02/13/19 1406      Core Components/Risk Factors/Patient Goals on Admission    Weight Management  Yes;Weight Loss    Intervention  Weight Management: Develop a combined nutrition and exercise program designed to reach desired caloric intake, while maintaining appropriate intake of nutrient and fiber, sodium and fats, and appropriate energy expenditure required for the weight goal.;Weight Management: Provide education and appropriate resources to help participant work on and attain dietary goals.;Weight Management/Obesity: Establish  reasonable short term and long term weight goals.    Admit Weight  252 lb (114.3 kg)    Goal Weight: Short Term  248 lb (112.5 kg)    Goal Weight: Long Term  215 lb (97.5 kg)    Expected Outcomes  Short Term: Continue to assess and modify interventions until short term weight is achieved;Long Term: Adherence to nutrition and physical activity/exercise program aimed toward attainment of established weight goal;Weight Loss: Understanding of general recommendations for a balanced deficit meal plan, which promotes 1-2 lb weight loss per week and includes a negative energy balance of [phone] kcal/d;Understanding recommendations for meals to include 15-35% energy as protein, 25-35% energy from fat, 35-60%  energy from carbohydrates, less than '200mg'$  of dietary cholesterol, 20-35 gm of total fiber daily;Understanding of distribution of calorie intake throughout the day with the consumption of 4-5 meals/snacks    Hypertension  Yes    Intervention  Provide education on lifestyle modifcations including regular physical activity/exercise, weight management, moderate sodium restriction and increased consumption of fresh fruit, vegetables, and low fat dairy, alcohol moderation, and smoking cessation.;Monitor prescription use compliance.    Expected Outcomes  Short Term: Continued assessment and intervention until BP is < 140/25m HG in hypertensive participants. < 130/864mHG in hypertensive participants with diabetes, heart failure or chronic kidney disease.;Long Term: Maintenance of blood pressure at goal levels.    Lipids  Yes    Intervention  Provide education and support for participant on nutrition & aerobic/resistive exercise along with prescribed medications to achieve LDL '70mg'$ , HDL >'40mg'$ .    Expected Outcomes  Short Term: Participant states understanding of desired cholesterol values and is compliant with medications prescribed. Participant is following exercise prescription and nutrition guidelines.;Long Term: Cholesterol controlled with medications as prescribed, with individualized exercise RX and with personalized nutrition plan. Value goals: LDL < '70mg'$ , HDL > 40 mg.       Core Components/Risk Factors/Patient Goals Review:    Core Components/Risk Factors/Patient Goals at Discharge (Final Review):    ITP Comments: ITP Comments    Row Name 02/13/19 1339 02/28/19 1007 03/05/19 1337 04/15/19 1047 05/01/19 1604   ITP Comments  Med Review completed. Initial ITP created. [facility]  Our program is currently closed due to COVID-19.  We are communicating with patient via phone calls and emails.   30 day review completed. Continue with ITP unless directed changes by Medical  Director at review  RiAbdulhamids still exercising at home - up to walking 3 miles at 3 mph.  He is still riding his bike alternate days.  He is keeping a log of his food.  I will email Melissa about nutrition consult.   RiGorjes riding his bike at th[employer] walking.  He is monitoring his BP at home at rest and during exercise.  I will send meLenna Sciarais email as they havent been able to touch base via phone.   [place] 06/25/19 1334           ITP Comments  30 day review cycle restarting  after being closed since March 16 because of  Covid 19 pandemic. Program opened to patients on July 6. Not all have returned. ITP updated and sent to Medical Director for review,changes as needed and signature          Comments:

## 2019-06-30 ENCOUNTER — Other Ambulatory Visit: Payer: Self-pay

## 2019-06-30 ENCOUNTER — Ambulatory Visit (INDEPENDENT_AMBULATORY_CARE_PROVIDER_SITE_OTHER): Payer: Medicare Other | Admitting: Family Medicine

## 2019-06-30 ENCOUNTER — Encounter: Payer: Self-pay | Admitting: Family Medicine

## 2019-06-30 DIAGNOSIS — F419 Anxiety disorder, unspecified: Secondary | ICD-10-CM | POA: Diagnosis not present

## 2019-06-30 DIAGNOSIS — I2510 Atherosclerotic heart disease of native coronary artery without angina pectoris: Secondary | ICD-10-CM | POA: Diagnosis not present

## 2019-06-30 DIAGNOSIS — I10 Essential (primary) hypertension: Secondary | ICD-10-CM | POA: Diagnosis not present

## 2019-06-30 DIAGNOSIS — F339 Major depressive disorder, recurrent, unspecified: Secondary | ICD-10-CM

## 2019-06-30 DIAGNOSIS — I25118 Atherosclerotic heart disease of native coronary artery with other forms of angina pectoris: Secondary | ICD-10-CM | POA: Diagnosis not present

## 2019-06-30 DIAGNOSIS — N400 Enlarged prostate without lower urinary tract symptoms: Secondary | ICD-10-CM

## 2019-06-30 DIAGNOSIS — E785 Hyperlipidemia, unspecified: Secondary | ICD-10-CM

## 2019-06-30 DIAGNOSIS — M10072 Idiopathic gout, left ankle and foot: Secondary | ICD-10-CM | POA: Diagnosis not present

## 2019-06-30 DIAGNOSIS — Z7189 Other specified counseling: Secondary | ICD-10-CM

## 2019-06-30 DIAGNOSIS — I2583 Coronary atherosclerosis due to lipid rich plaque: Secondary | ICD-10-CM | POA: Diagnosis not present

## 2019-06-30 DIAGNOSIS — K22719 Barrett's esophagus with dysplasia, unspecified: Secondary | ICD-10-CM

## 2019-06-30 MED ORDER — PANTOPRAZOLE SODIUM 20 MG PO TBEC: 20.0000 mg | DELAYED_RELEASE_TABLET | Freq: Two times a day (BID) | ORAL | 4 refills | Status: DC

## 2019-06-30 MED ORDER — AMLODIPINE BESYLATE 10 MG PO TABS: 10.0000 mg | ORAL_TABLET | Freq: Every day | ORAL | 4 refills | Status: DC

## 2019-06-30 MED ORDER — FLUOXETINE HCL 20 MG PO CAPS: 20.0000 mg | ORAL_CAPSULE | Freq: Every day | ORAL | 4 refills | Status: DC

## 2019-06-30 MED ORDER — ATORVASTATIN CALCIUM 40 MG PO TABS: 40.0000 mg | ORAL_TABLET | Freq: Every day | ORAL | 4 refills | Status: DC

## 2019-06-30 MED ORDER — ALLOPURINOL 300 MG PO TABS: 300.0000 mg | ORAL_TABLET | Freq: Every day | ORAL | 4 refills | Status: DC

## 2019-06-30 MED ORDER — LOSARTAN POTASSIUM 100 MG PO TABS: 100.0000 mg | ORAL_TABLET | Freq: Every day | ORAL | 3 refills | Status: DC

## 2019-06-30 MED ORDER — METOPROLOL TARTRATE 25 MG PO TABS: 25.0000 mg | ORAL_TABLET | Freq: Two times a day (BID) | ORAL | 3 refills | Status: DC

## 2019-06-30 MED ORDER — LORAZEPAM 1 MG PO TABS: 0.5000 mg | ORAL_TABLET | Freq: Every day | ORAL | 1 refills | Status: DC | PRN

## 2019-06-30 NOTE — Assessment & Plan Note (Signed)
The current medical regimen is effective;  continue present plan and medications.  

## 2019-06-30 NOTE — Assessment & Plan Note (Signed)
Discussed gout patient with no symptoms or problems has switched to Uloric because of concern about weight issues now but due to cost wants to go back to allopurinol

## 2019-06-30 NOTE — Assessment & Plan Note (Signed)
A voluntary discussion about advanced care planning including explanation and discussion of advanced directives was extentively discussed with the patient.  Explained about the healthcare proxy and living will was reviewed and packet with forms with expiration of how to fill them out was given.  Time spent: Encounter 16+ min individuals present: Patient 

## 2019-06-30 NOTE — Assessment & Plan Note (Signed)
Discussed intermittent anxiety and limited use of lorazepam which patient is doing 30 pills with 1 refill of lasted 6 months.  Will give a refill again.

## 2019-06-30 NOTE — Progress Notes (Signed)
BP 137/77    Subjective:    Patient ID: Jesse Norton, male    DOB: 21-Oct-1946, 73 y.o.   MRN: [id number]  HPI: Jesse Norton is a 73 y.o. male  Med check  Discussed with patient concerns about upcoming vacation to [place] and COVID-19.  Further discussion with patient and partner is not taking precautions.  Discussed risk and patient's personal medical history. Doing well with otherwise no cardiology concerns no chest pain chest tightness. Depression doing well no BPH symptoms. Cholesterol stable and no other medication issues or problems needing refills.  Relevant past medical, surgical, family and social history reviewed and updated as indicated. Interim medical history since our last visit reviewed. Allergies and medications reviewed and updated.  Review of Systems  Constitutional: Negative.   HENT: Negative.   Eyes: Negative.   Respiratory: Negative.   Cardiovascular: Negative.   Gastrointestinal: Negative.   Endocrine: Negative.   Genitourinary: Negative.   Musculoskeletal: Negative.   Skin: Negative.   Allergic/Immunologic: Negative.   Neurological: Negative.   Hematological: Negative.   Psychiatric/Behavioral: Negative.     Per HPI unless specifically indicated above     Objective:    BP 137/77   Wt Readings from Last 3 Encounters:  06/12/19 261 lb 8 oz (118.6 kg)  06/04/19 253 lb 12.8 oz (115.1 kg)  05/21/19 248 lb (112.5 kg)    Physical Exam  Results for orders placed or performed in visit on 06/12/19  Lactate dehydrogenase  Result Value Ref Range   LDH 180 98 - 192 U/L  Comprehensive metabolic panel  Result Value Ref Range   Sodium 139 135 - 145 mmol/L   Potassium 4.2 3.5 - 5.1 mmol/L   Chloride 100 98 - 111 mmol/L   CO2 29 22 - 32 mmol/L   Glucose, Bld 119 (H) 70 - 99 mg/dL   BUN 19 8 - 23 mg/dL   Creatinine, Ser 0.96 0.61 - 1.24 mg/dL   Calcium 9.3 8.9 - 10.3 mg/dL   Total Protein 7.6 6.5 - 8.1 g/dL   Albumin 4.4 3.5 - 5.0 g/dL   AST  35 15 - 41 U/L   ALT 31 0 - 44 U/L   Alkaline Phosphatase 66 38 - 126 U/L   Total Bilirubin 1.2 0.3 - 1.2 mg/dL   GFR calc non Af Amer >60 >60 mL/min   GFR calc Af Amer >60 >60 mL/min   Anion gap 10 5 - 15  CBC with Differential  Result Value Ref Range   WBC 6.7 4.0 - 10.5 K/uL   RBC 4.10 (L) 4.22 - 5.81 MIL/uL   Hemoglobin 14.1 13.0 - 17.0 g/dL   HCT 40.3 39.0 - 52.0 %   MCV 98.3 80.0 - 100.0 fL   MCH 34.4 (H) 26.0 - 34.0 pg   MCHC 35.0 30.0 - 36.0 g/dL   RDW 12.9 11.5 - 15.5 %   Platelets 235 150 - 400 K/uL   nRBC 0.0 0.0 - 0.2 %   Neutrophils Relative % 47 %   Neutro Abs 3.1 1.7 - 7.7 K/uL   Lymphocytes Relative 37 %   Lymphs Abs 2.5 0.7 - 4.0 K/uL   Monocytes Relative 13 %   Monocytes Absolute 0.9 0.1 - 1.0 K/uL   Eosinophils Relative 1 %   Eosinophils Absolute 0.1 0.0 - 0.5 K/uL   Basophils Relative 1 %   Basophils Absolute 0.1 0.0 - 0.1 K/uL   Immature Granulocytes 1 %   Abs  Immature Granulocytes 0.05 0.00 - 0.07 K/uL      Assessment & Plan:   Problem List Items Addressed This Visit      Cardiovascular and Mediastinum   Essential hypertension    The current medical regimen is effective;  continue present plan and medications.       Relevant Medications   amLODipine (NORVASC) 10 MG tablet   losartan (COZAAR) 100 MG tablet   atorvastatin (LIPITOR) 40 MG tablet   metoprolol tartrate (LOPRESSOR) 25 MG tablet   Other Relevant Orders   TSH   Urinalysis, Routine w reflex microscopic   Coronary artery disease with stable angina pectoris ([place])    The current medical regimen is effective;  continue present plan and medications.       Relevant Medications   amLODipine (NORVASC) 10 MG tablet   losartan (COZAAR) 100 MG tablet   atorvastatin (LIPITOR) 40 MG tablet   metoprolol tartrate (LOPRESSOR) 25 MG tablet   Other Relevant Orders   Lipid panel   TSH   Urinalysis, Routine w reflex microscopic     Digestive   Barrett's esophagus    The current medical  regimen is effective;  continue present plan and medications.       Relevant Medications   pantoprazole (PROTONIX) 20 MG tablet     Genitourinary   BPH (benign prostatic hyperplasia)    The current medical regimen is effective;  continue present plan and medications.       Relevant Orders   PSA     Other   Acute anxiety    Discussed intermittent anxiety and limited use of lorazepam which patient is doing 30 pills with 1 refill of lasted 6 months.  Will give a refill again.      Relevant Medications   FLUoxetine (PROZAC) 20 MG capsule   LORazepam (ATIVAN) 1 MG tablet   Gout    Discussed gout patient with no symptoms or problems has switched to Uloric because of concern about weight issues now but due to cost wants to go back to allopurinol      Relevant Orders   TSH   Uric acid   Advanced care planning/counseling discussion    A voluntary discussion about advanced care planning including explanation and discussion of advanced directives was extentively discussed with the patient.  Explained about the healthcare proxy and living will was reviewed and packet with forms with expiration of how to fill them out was given.  Time spent: Encounter 16+ min individuals present: Patient      Depression, recurrent ([place])    The current medical regimen is effective;  continue present plan and medications.       Relevant Medications   FLUoxetine (PROZAC) 20 MG capsule   LORazepam (ATIVAN) 1 MG tablet   Other Relevant Orders   TSH   Hyperlipidemia LDL goal <70    The current medical regimen is effective;  continue present plan and medications.       Relevant Medications   amLODipine (NORVASC) 10 MG tablet   losartan (COZAAR) 100 MG tablet   atorvastatin (LIPITOR) 40 MG tablet   metoprolol tartrate (LOPRESSOR) 25 MG tablet   Other Relevant Orders   Lipid panel   TSH       Follow up plan: Return in about 6 months (around 12/31/2019) for BMP,  Lipids, ALT, AST, uric acid.

## 2019-07-01 ENCOUNTER — Telehealth: Payer: Medicare Other

## 2019-07-02 ENCOUNTER — Encounter: Payer: Medicare Other | Attending: Internal Medicine | Admitting: *Deleted

## 2019-07-02 ENCOUNTER — Other Ambulatory Visit: Payer: Self-pay

## 2019-07-02 DIAGNOSIS — Z955 Presence of coronary angioplasty implant and graft: Secondary | ICD-10-CM | POA: Insufficient documentation

## 2019-07-02 NOTE — Progress Notes (Signed)
Daily Session Note  Patient Details  Name: Jesse Norton MRN: [id number] Date of Birth: 19-Nov-1946 Referring Provider:     Cardiac Rehab from 02/13/2019 in [facility] Cardiac and Pulmonary Rehab  Referring Provider  End      Encounter Date: 07/02/2019  Check In: Session Check In - 07/02/19 1413      Check-In   Supervising physician immediately available to respond to emergencies  See telemetry face sheet for immediately available ER MD    Location  [facility]-Cardiac & Pulmonary Rehab    Staff Present  Renita Papa, RN BSN;Melissa Caiola RDN, LDN;Joseph Tessie Fass RCP,RRT,BSRT    Virtual Visit  No    Medication changes reported      No    Fall or balance concerns reported     No    Warm-up and Cool-down  Performed on first and last piece of equipment    Resistance Training Performed  Yes    VAD Patient?  No    PAD/SET Patient?  No      Pain Assessment   Currently in Pain?  No/denies          Social History   Tobacco Use  Smoking Status Never Smoker  Smokeless Tobacco Never Used    Goals Met:  Independence with exercise equipment Exercise tolerated well No report of cardiac concerns or symptoms Strength training completed today  Goals Unmet:  Not Applicable  Comments: First full day of exercise!  Patient was oriented to gym and equipment including functions, settings, policies, and procedures.  Patient's individual exercise prescription and treatment plan were reviewed.  All starting workloads were established based on the results of the 6 minute walk test done at initial orientation visit.  The plan for exercise progression was also introduced and progression will be customized based on patient's performance and goals.    Dr. Emily Filbert is Medical Director for [place] and [facility].

## 2019-07-03 ENCOUNTER — Other Ambulatory Visit: Payer: Self-pay

## 2019-07-03 ENCOUNTER — Encounter: Payer: Medicare Other | Admitting: *Deleted

## 2019-07-03 DIAGNOSIS — Z955 Presence of coronary angioplasty implant and graft: Secondary | ICD-10-CM

## 2019-07-03 NOTE — Progress Notes (Signed)
Daily Session Note  Patient Details  Name: Jesse Norton MRN: [id number] Date of Birth: 10/24/1946 Referring Provider:     Cardiac Rehab from 02/13/2019 in [facility] Cardiac and Pulmonary Rehab  Referring Provider  End      Encounter Date: 07/03/2019  Check In: Session Check In - 07/03/19 1403      Check-In   Supervising physician immediately available to respond to emergencies  See telemetry face sheet for immediately available ER MD    Location  [facility]-Cardiac & Pulmonary Rehab    Staff Present  Renita Papa, RN BSN;Joseph Tessie Fass RCP,RRT,BSRT    Virtual Visit  No    Medication changes reported      No    Fall or balance concerns reported     No    Warm-up and Cool-down  Performed on first and last piece of equipment    Resistance Training Performed  Yes    VAD Patient?  No    PAD/SET Patient?  No      Pain Assessment   Currently in Pain?  No/denies          Social History   Tobacco Use  Smoking Status Never Smoker  Smokeless Tobacco Never Used    Goals Met:  Independence with exercise equipment Exercise tolerated well No report of cardiac concerns or symptoms Strength training completed today  Goals Unmet:  Not Applicable  Comments: Pt able to follow exercise prescription today without complaint.  Will continue to monitor for progression.    Dr. Emily Filbert is Medical Director for [place] and [facility].

## 2019-07-07 ENCOUNTER — Encounter: Payer: Medicare Other | Admitting: *Deleted

## 2019-07-07 ENCOUNTER — Other Ambulatory Visit: Payer: Self-pay

## 2019-07-07 DIAGNOSIS — Z955 Presence of coronary angioplasty implant and graft: Secondary | ICD-10-CM

## 2019-07-07 NOTE — Progress Notes (Signed)
Daily Session Note  Patient Details  Name: CRIMSON BEER MRN: [id number] Date of Birth: 1946-02-17 Referring Provider:     Cardiac Rehab from 02/13/2019 in [facility] Cardiac and Pulmonary Rehab  Referring Provider  End      Encounter Date: 07/07/2019  Check In: Session Check In - 07/07/19 1402      Check-In   Supervising physician immediately available to respond to emergencies  See telemetry face sheet for immediately available ER MD    Location  [facility]-Cardiac & Pulmonary Rehab    Staff Present  Renita Papa, RN Moises Blood, BS, ACSM CEP, Exercise Physiologist;Joseph Tessie Fass RCP,RRT,BSRT    Virtual Visit  No    Medication changes reported      No    Fall or balance concerns reported     No    Warm-up and Cool-down  Performed on first and last piece of equipment    Resistance Training Performed  Yes    VAD Patient?  No    PAD/SET Patient?  No      Pain Assessment   Currently in Pain?  No/denies          Social History   Tobacco Use  Smoking Status Never Smoker  Smokeless Tobacco Never Used    Goals Met:  Independence with exercise equipment Exercise tolerated well No report of cardiac concerns or symptoms Strength training completed today  Goals Unmet:  Not Applicable  Comments: Pt able to follow exercise prescription today without complaint.  Will continue to monitor for progression.    Dr. Emily Filbert is Medical Director for [place] and [facility].

## 2019-07-09 ENCOUNTER — Other Ambulatory Visit: Payer: Self-pay

## 2019-07-09 ENCOUNTER — Encounter: Payer: Medicare Other | Admitting: *Deleted

## 2019-07-09 DIAGNOSIS — Z955 Presence of coronary angioplasty implant and graft: Secondary | ICD-10-CM | POA: Diagnosis not present

## 2019-07-09 NOTE — Progress Notes (Signed)
Daily Session Note  Patient Details  Name: Jesse Norton MRN: [id number] Date of Birth: 08-07-1946 Referring Provider:     Cardiac Rehab from 02/13/2019 in [facility] Cardiac and Pulmonary Rehab  Referring Provider  End      Encounter Date: 07/09/2019  Check In: Session Check In - 07/09/19 Jesse Norton      Check-In   Supervising physician immediately available to respond to emergencies  See telemetry face sheet for immediately available ER MD    Location  [facility]-Cardiac & Pulmonary Rehab    Staff Present  Jesse Papa, RN BSN;Jesse Norton RCP,RRT,BSRT    Virtual Visit  No    Medication changes reported      No    Fall or balance concerns reported     No    Warm-up and Cool-down  Performed on first and last piece of equipment    Resistance Training Performed  Yes    VAD Patient?  No    PAD/SET Patient?  No      Pain Assessment   Currently in Pain?  No/denies          Social History   Tobacco Use  Smoking Status Never Smoker  Smokeless Tobacco Never Used    Goals Met:  Independence with exercise equipment Exercise tolerated well No report of cardiac concerns or symptoms Strength training completed today  Goals Unmet:  Not Applicable  Comments: Pt able to follow exercise prescription today without complaint.  Will continue to monitor for progression.    Dr. Emily Norton is Medical Director for [place] and [facility].

## 2019-07-10 ENCOUNTER — Encounter: Payer: Medicare Other | Admitting: *Deleted

## 2019-07-10 DIAGNOSIS — Z955 Presence of coronary angioplasty implant and graft: Secondary | ICD-10-CM

## 2019-07-10 NOTE — Progress Notes (Signed)
Daily Session Note  Patient Details  Name: Jesse Norton MRN: [id number] Date of Birth: Apr 21, 1946 Referring Provider:     Cardiac Rehab from 02/13/2019 in [facility] Cardiac and Pulmonary Rehab  Referring Provider  End      Encounter Date: 07/10/2019  Check In: Session Check In - 07/10/19 1402      Check-In   Supervising physician immediately available to respond to emergencies  See telemetry face sheet for immediately available ER MD    Location  [facility]-Cardiac [facility] Pulmonary Rehab    Staff Present  Renita Papa, RN BSN;Joseph Tessie Fass RCP,RRT,BSRT    Virtual Visit  No    Medication changes reported      No    Fall or balance concerns reported     No    Warm-up and Cool-down  Performed on first and last piece of equipment    Resistance Training Performed  Yes    VAD Patient?  No    PAD/SET Patient?  No      Pain Assessment   Currently in Pain?  No/denies          Social History   Tobacco Use  Smoking Status Never Smoker  Smokeless Tobacco Never Used    Goals Met:  Independence with exercise equipment Exercise tolerated well No report of cardiac concerns or symptoms Strength training completed today  Goals Unmet:  Not Applicable  Comments: Pt able to follow exercise prescription today without complaint.  Will continue to monitor for progression.    Dr. Emily Filbert is Medical Director for [place] and [facility].

## 2019-07-15 ENCOUNTER — Other Ambulatory Visit: Payer: Self-pay

## 2019-07-15 ENCOUNTER — Encounter: Payer: Medicare Other | Attending: Internal Medicine

## 2019-07-15 DIAGNOSIS — Z955 Presence of coronary angioplasty implant and graft: Secondary | ICD-10-CM

## 2019-07-15 NOTE — Progress Notes (Signed)
Daily Session Note  Patient Details  Name: Jesse Norton MRN: [id number] Date of Birth: 03/04/1946 Referring Provider:     Cardiac Rehab from 02/13/2019 in [facility] Cardiac and Pulmonary Rehab  Referring Provider  End      Encounter Date: 07/15/2019  Check In: Session Check In - 07/15/19 1000      Check-In   Supervising physician immediately available to respond to emergencies  See telemetry face sheet for immediately available ER MD    Location  [facility]-Cardiac & Pulmonary Rehab    Staff Present  Joseph Hood RCP,RRT,BSRT;Susanne Bice, RN, BSN, CCRP;Amanda Sommer, BA, ACSM CEP, Exercise Physiologist;Jeanna Durrell BS, Exercise Physiologist    Virtual Visit  No    Medication changes reported      No    Fall or balance concerns reported     No    Warm-up and Cool-down  Performed on first and last piece of equipment    Resistance Training Performed  Yes    VAD Patient?  No    PAD/SET Patient?  No      Pain Assessment   Currently in Pain?  No/denies          Social History   Tobacco Use  Smoking Status Never Smoker  Smokeless Tobacco Never Used    Goals Met:  Independence with exercise equipment Exercise tolerated well No report of cardiac concerns or symptoms Strength training completed today  Goals Unmet:  Not Applicable  Comments: Pt able to follow exercise prescription today without complaint.  Will continue to monitor for progression.   Dr. Mark Miller is Medical Director for [facility] and [facility]. 

## 2019-07-16 DIAGNOSIS — Z955 Presence of coronary angioplasty implant and graft: Secondary | ICD-10-CM | POA: Diagnosis not present

## 2019-07-16 NOTE — Progress Notes (Signed)
Daily Session Note  Patient Details  Name: Jesse Norton MRN: [id number] Date of Birth: 10-01-1946 Referring Provider:     Cardiac Rehab from 02/13/2019 in [facility] Cardiac and Pulmonary Rehab  Referring Provider  End      Encounter Date: 07/16/2019  Check In: Session Check In - 07/16/19 [place]      Check-In   Supervising physician immediately available to respond to emergencies  See telemetry face sheet for immediately available ER MD    Location  [facility]-Cardiac & Pulmonary Rehab    Staff Present  Justin Mend RCP,RRT,BSRT;Melissa Caiola RDN, LDN;Mary Kellie Shropshire, RN, BSN, Kela Millin, BA, ACSM CEP, Exercise Physiologist    Virtual Visit  No    Medication changes reported      No    Fall or balance concerns reported     No    Warm-up and Cool-down  Performed on first and last piece of equipment    Resistance Training Performed  Yes    VAD Patient?  No    PAD/SET Patient?  No      Pain Assessment   Currently in Pain?  No/denies          Social History   Tobacco Use  Smoking Status Never Smoker  Smokeless Tobacco Never Used    Goals Met:  Independence with exercise equipment Exercise tolerated well No report of cardiac concerns or symptoms Strength training completed today  Goals Unmet:  Not Applicable  Comments: Pt able to follow exercise prescription today without complaint.  Will continue to monitor for progression.    Dr. Emily Filbert is Medical Director for [place] and [facility].

## 2019-07-17 ENCOUNTER — Other Ambulatory Visit: Payer: Self-pay

## 2019-07-17 ENCOUNTER — Encounter: Payer: Medicare Other | Admitting: *Deleted

## 2019-07-17 DIAGNOSIS — Z955 Presence of coronary angioplasty implant and graft: Secondary | ICD-10-CM | POA: Diagnosis not present

## 2019-07-17 NOTE — Progress Notes (Signed)
Daily Session Note  Patient Details  Name: Jesse Norton MRN: [id number] Date of Birth: 08/21/1946 Referring Provider:     Cardiac Rehab from 02/13/2019 in [facility] Cardiac and Pulmonary Rehab  Referring Provider  End      Encounter Date: 07/17/2019  Check In: Session Check In - 07/17/19 1356      Check-In   Supervising physician immediately available to respond to emergencies  See telemetry face sheet for immediately available ER MD    Location  [facility]-Cardiac & Pulmonary Rehab    Staff Present  Renita Papa, RN BSN;Joseph Foy [place], [place], ACSM CEP, Exercise Physiologist;Laureen Owens Shark, BS, RRT, CPFT    Virtual Visit  No    Medication changes reported      No    Fall or balance concerns reported     No    Warm-up and Cool-down  Performed on first and last piece of equipment    Resistance Training Performed  Yes    VAD Patient?  No    PAD/SET Patient?  No      Pain Assessment   Currently in Pain?  No/denies          Social History   Tobacco Use  Smoking Status Never Smoker  Smokeless Tobacco Never Used    Goals Met:  Independence with exercise equipment Exercise tolerated well No report of cardiac concerns or symptoms Strength training completed today  Goals Unmet:  Not Applicable  Comments: Pt able to follow exercise prescription today without complaint.  Will continue to monitor for progression.    Dr. Emily Filbert is Medical Director for [place] and [facility].

## 2019-07-22 ENCOUNTER — Other Ambulatory Visit: Payer: Self-pay

## 2019-07-22 ENCOUNTER — Encounter: Payer: Medicare Other | Admitting: *Deleted

## 2019-07-22 DIAGNOSIS — Z955 Presence of coronary angioplasty implant and graft: Secondary | ICD-10-CM

## 2019-07-22 NOTE — Progress Notes (Signed)
Daily Session Note  Patient Details  Name: Jesse Norton MRN: [id number] Date of Birth: 1946/09/18 Referring Provider:     Cardiac Rehab from 02/13/2019 in [facility] Cardiac and Pulmonary Rehab  Referring Provider  End      Encounter Date: 07/22/2019  Check In: Session Check In - 07/22/19 1023      Check-In   Supervising physician immediately available to respond to emergencies  See telemetry face sheet for immediately available ER MD    Location  [facility]-Cardiac & Pulmonary Rehab    Staff Present  Alberteen Sam, MA, RCEP, CCRP, CCET;Joseph [place];Heath Lark, RN, BSN, Lance Sell, BA, ACSM CEP, Exercise Physiologist    Virtual Visit  No    Medication changes reported      No    Fall or balance concerns reported     No    Warm-up and Cool-down  Performed on first and last piece of equipment    Resistance Training Performed  Yes    VAD Patient?  No    PAD/SET Patient?  No      Pain Assessment   Currently in Pain?  No/denies          Social History   Tobacco Use  Smoking Status Never Smoker  Smokeless Tobacco Never Used    Goals Met:  Independence with exercise equipment Exercise tolerated well No report of cardiac concerns or symptoms Strength training completed today  Goals Unmet:  Not Applicable  Comments: Pt able to follow exercise prescription today without complaint.  Will continue to monitor for progression.    Dr. Emily Filbert is Medical Director for [place] and [facility].

## 2019-07-23 ENCOUNTER — Encounter: Payer: Self-pay | Admitting: *Deleted

## 2019-07-23 DIAGNOSIS — Z955 Presence of coronary angioplasty implant and graft: Secondary | ICD-10-CM

## 2019-07-23 NOTE — Progress Notes (Signed)
Cardiac Individual Treatment Plan  Patient Details  Name: Jesse Norton MRN: [id number] Date of Birth: 16-Oct-1946 Referring Provider:     Cardiac Rehab from 02/13/2019 in [facility] Cardiac and Pulmonary Rehab  Referring Provider  End      Initial Encounter Date:    Cardiac Rehab from 02/13/2019 in [facility] Cardiac and Pulmonary Rehab  Date  02/13/19      Visit Diagnosis: Status post coronary artery stent placement  Patient's Home Medications on Admission:  Current Outpatient Medications:  .  allopurinol (ZYLOPRIM) 300 MG tablet, Take 1 tablet (300 mg total) by mouth daily., Disp: 90 tablet, Rfl: 4 .  amLODipine (NORVASC) 10 MG tablet, Take 1 tablet (10 mg total) by mouth daily., Disp: 90 tablet, Rfl: 4 .  aspirin EC 81 MG tablet, Take 81 mg by mouth daily., Disp: , Rfl:  .  atorvastatin (LIPITOR) 40 MG tablet, Take 1 tablet (40 mg total) by mouth daily., Disp: 90 tablet, Rfl: 4 .  Bioflavonoid Products (BIOFLEX PO), Take 1 tablet by mouth 3 (three) times a week. , Disp: , Rfl:  .  clopidogrel (PLAVIX) 75 MG tablet, Take 1 tablet (75 mg total) by mouth daily with breakfast., Disp: 90 tablet, Rfl: 3 .  FLUoxetine (PROZAC) 20 MG capsule, Take 1 capsule (20 mg total) by mouth daily., Disp: 90 capsule, Rfl: 4 .  furosemide (LASIX) 20 MG tablet, Take 1 tablet (20 mg total) by mouth daily for 30 days. (Patient taking differently: Take 20 mg by mouth daily. Daily), Disp: 90 tablet, Rfl: 3 .  LORazepam (ATIVAN) 1 MG tablet, Take 0.5-1 tablets (0.5-1 mg total) by mouth daily as needed for anxiety., Disp: 30 tablet, Rfl: 1 .  losartan (COZAAR) 100 MG tablet, Take 1 tablet (100 mg total) by mouth daily., Disp: 90 tablet, Rfl: 3 .  metoprolol tartrate (LOPRESSOR) 25 MG tablet, Take 1 tablet (25 mg total) by mouth 2 (two) times daily., Disp: 180 tablet, Rfl: 3 .  Multiple Vitamin (MULTIVITAMIN) tablet, Take 1 tablet by mouth daily., Disp: , Rfl:  .  Multiple Vitamins-Minerals (EMERGEN-C IMMUNE PO), Take 1  tablet by mouth every other day. , Disp: , Rfl:  .  pantoprazole (PROTONIX) 20 MG tablet, Take 1 tablet (20 mg total) by mouth 2 (two) times daily., Disp: 180 tablet, Rfl: 4  Past Medical History: Past Medical History:  Diagnosis Date  . (HFpEF) heart failure with preserved ejection fraction ([place])    a. 01/2019 Cath: EF 55-60% w/ elevated LVEDP.  Marland Kitchen Anxiety   . Barrett's esophagus   . CAD (coronary artery disease)    a. 10/2018 ETT: 40m horizontal inflat ST depression, freq PVC's->Intermediate; b. 12/2018 Cardiac CTA: LM nl, LAD FFRct 0.6 in mLAD, LCX FFRct 0.99p, 0.88m0.77d, RCA no signif dzs, FFRct 0.89d->Rec cath; c. 01/2019 PCI: LM nl, LAD 40p, 8547m2 70, LCX 70p (2.75x15 Resolute Onyx DES), RCA min irregs. EF 55-65%. EDP 25-44m38m d. 01/2019 PCI: LAD 80-90 (atherectomy & 3x15 Resolute Onyx DES).  . Colon polyps   . Duodenal adenoma   . Gastritis   . GERD (gastroesophageal reflux disease)   . Gout   . H. pylori infection   . H/O urethral stricture   . Hemorrhoids   . Hiatal hernia   . Hypertension   . Stomach ulcer     Tobacco Use: Social History   Tobacco Use  Smoking Status Never Smoker  Smokeless Tobacco Never Used    Labs: Recent Review [employer], physiological  Labs for ITP Cardiac and Pulmonary Rehab Latest Ref Rng & Units 05/25/2015 05/30/2016 06/07/2017 06/26/2018 02/12/2019   Cholestrol <200 mg/dL 186 181 166 169 124   LDLCALC 0 - 99 mg/dL 109(H) 107(H) 85 98 -   HDL >39 mg/dL 62 62 56 59 -   Trlycerides <150 mg/dL 75 61 127 62 58       Exercise Target Goals: Exercise Program Goal: Individual exercise prescription set using results from initial 6 min walk test and THRR while considering  patient's activity barriers and safety.   Exercise Prescription Goal: Initial exercise prescription builds to 30-45 minutes a day of aerobic activity, 2-3 days per week.  Home exercise guidelines will be given to patient during program as part of exercise prescription that the  participant will acknowledge.  Activity Barriers & Risk Stratification: Activity Barriers & Cardiac Risk Stratification - 02/13/19 1432      Activity Barriers & Cardiac Risk Stratification   Activity Barriers  Back Problems    Cardiac Risk Stratification  Moderate       6 Minute Walk: 6 Minute Walk    Row Name 02/13/19 1435         6 Minute Walk   Phase  Initial     Distance  1200 feet     Walk Time  6 minutes     # of Rest Breaks  0     MPH  2.3     METS  2.08     RPE  10     VO2 Peak  7.3     Symptoms  No     Resting HR  60 bpm     Resting BP  132/68     Resting Oxygen Saturation   99 %     Exercise Oxygen Saturation  during 6 min walk  98 %     Max Ex. HR  107 bpm     Max Ex. BP  142/70     2 Minute Post BP  132/76        Oxygen Initial Assessment:   Oxygen Re-Evaluation:   Oxygen Discharge (Final Oxygen Re-Evaluation):   Initial Exercise Prescription: Initial Exercise Prescription - 02/13/19 1400      Date of Initial Exercise RX and Referring Provider   Date  02/13/19    Referring Provider  End      Treadmill   MPH  2.3    Grade  0    Minutes  15    METs  2.7      Recumbant Bike   Level  5    RPM  60    Watts  25    Minutes  15    METs  2.7      Elliptical   Level  1    Speed  2.6    Minutes  15      Prescription Details   Frequency (times per week)  3    Duration  Progress to 45 minutes of aerobic exercise without signs/symptoms of physical distress      Intensity   THRR 40-80% of Max Heartrate  94-129    Ratings of Perceived Exertion  11-13    Perceived Dyspnea  0-4      Resistance Training   Training Prescription  Yes    Weight  4 lb    Reps  10-15       Perform Capillary Blood Glucose checks as needed.  Exercise Prescription Changes: Exercise Prescription  Changes    Row Name 02/13/19 1400 07/14/19 1000           Response to Exercise   Blood Pressure (Admit)  132/68  136/80      Blood Pressure (Exercise)  142/70   144/60      Blood Pressure (Exit)  132/76  138/78      Heart Rate (Admit)  63 bpm  63 bpm      Heart Rate (Exercise)  107 bpm  122 bpm      Heart Rate (Exit)  53 bpm  92 bpm      Oxygen Saturation (Admit)  99 %  -      Oxygen Saturation (Exit)  98 %  -      Rating of Perceived Exertion (Exercise)  10  13      Symptoms  -  none      Duration  -  Continue with 30 min of aerobic exercise without signs/symptoms of physical distress.      Intensity  -  THRR unchanged        Progression   Progression  -  Continue to progress workloads to maintain intensity without signs/symptoms of physical distress.      Average METs  -  3.04        Resistance Training   Training Prescription  -  Yes      Weight  -  4 lbs      Reps  -  10-15        Interval Training   Interval Training  -  No        Treadmill   MPH  -  2.3      Grade  -  0.5      Minutes  -  15      METs  -  2.92        NuStep   Level  -  3      Minutes  -  15      METs  -  4.3        Recumbant Elliptical   Level  -  1      Minutes  -  15      METs  -  1.9        Elliptical   Level  -  1      Speed  -  4.1      Minutes  -  15        Home Exercise Plan   Plans to continue exercise at  -  Home (comment) walking, biking      Frequency  -  Add 2 additional days to program exercise sessions.      Initial Home Exercises Provided  -  03/21/19         Exercise Comments:   Exercise Goals and Review: Exercise Goals    Row Name 02/13/19 1434             Exercise Goals   Increase Physical Activity  Yes       Intervention  Provide advice, education, support and counseling about physical activity/exercise needs.;Develop an individualized exercise prescription for aerobic and resistive training based on initial evaluation findings, risk stratification, comorbidities and participant's personal goals.       Expected Outcomes  Short Term: Attend rehab on a regular basis to increase amount of physical activity.;Long Term:  Add in home exercise to make exercise part of routine and to  increase amount of physical activity.;Long Term: Exercising regularly at least 3-5 days a week.       Increase Strength and Stamina  Yes       Intervention  Provide advice, education, support and counseling about physical activity/exercise needs.;Develop an individualized exercise prescription for aerobic and resistive training based on initial evaluation findings, risk stratification, comorbidities and participant's personal goals.       Expected Outcomes  Short Term: Increase workloads from initial exercise prescription for resistance, speed, and METs.;Short Term: Perform resistance training exercises routinely during rehab and add in resistance training at home;Long Term: Improve cardiorespiratory fitness, muscular endurance and strength as measured by increased METs and functional capacity (6MWT)       Able to understand and use rate of perceived exertion (RPE) scale  Yes       Intervention  Provide education and explanation on how to use RPE scale       Expected Outcomes  Short Term: Able to use RPE daily in rehab to express subjective intensity level;Long Term:  Able to use RPE to guide intensity level when exercising independently       Able to understand and use Dyspnea scale  Yes       Intervention  Provide education and explanation on how to use Dyspnea scale       Expected Outcomes  Short Term: Able to use Dyspnea scale daily in rehab to express subjective sense of shortness of breath during exertion;Long Term: Able to use Dyspnea scale to guide intensity level when exercising independently       Knowledge and understanding of Target Heart Rate Range (THRR)  Yes       Intervention  Provide education and explanation of THRR including how the numbers were predicted and where they are located for reference       Expected Outcomes  Short Term: Able to state/look up THRR;Short Term: Able to use daily as guideline for intensity in  rehab;Long Term: Able to use THRR to govern intensity when exercising independently       Able to check pulse independently  Yes       Intervention  Provide education and demonstration on how to check pulse in carotid and radial arteries.;Review the importance of being able to check your own pulse for safety during independent exercise       Expected Outcomes  Short Term: Able to explain why pulse checking is important during independent exercise;Long Term: Able to check pulse independently and accurately       Understanding of Exercise Prescription  Yes       Intervention  Provide education, explanation, and written materials on patient's individual exercise prescription       Expected Outcomes  Short Term: Able to explain program exercise prescription;Long Term: Able to explain home exercise prescription to exercise independently          Exercise Goals Re-Evaluation : Exercise Goals Re-Evaluation    Row Name 03/21/19 1133 07/02/19 1414 07/14/19 0959 07/17/19 1534       Exercise Goal Re-Evaluation   Exercise Goals Review  Increase Physical Activity;Increase Strength and Stamina  Increase Physical Activity;Able to understand and use rate of perceived exertion (RPE) scale;Knowledge and understanding of Target Heart Rate Range (THRR);Understanding of Exercise Prescription;Increase Strength and Stamina;Able to check pulse independently  Increase Physical Activity;Increase Strength and Stamina;Understanding of Exercise Prescription  Increase Physical Activity    Comments  Olvin has been walking 6 days a week 1-2 miles, and  riding his bike inside 3 days.  He also does light weight work.   Staff will forward the videos so he has some other strength exercise to follow.  He did have to cut up a tree that fell and could tell he was tired - his son helped.  He is looking forward to starting HT.  Reviewed RPE scale, THR and program prescription with pt today.  Pt voiced understanding and was given a copy of  goals to take home.  Daqwan is off to a good start. He has even started on the elliptical and up to 3 METs on the NuStep. We will continue to monitor his progress.  Evren talked about his home exercise routine today.  walking alternate days, biking 10 miles or rollers, cardio. USes stationary bike .  6 days a week routine    Expected Outcomes  Short - continue exercise at home Long - improve MET level  Short: Use RPE daily to regulate intensity. Long: Follow program prescription in THR.  Short: Continue to increase workloads.  Long: Continue to increase strength and stamina.  Short: Continue to increase workloads.  Long: Continue to increase strength and stamina.       Discharge Exercise Prescription (Final Exercise Prescription Changes): Exercise Prescription Changes - 07/14/19 1000      Response to Exercise   Blood Pressure (Admit)  136/80    Blood Pressure (Exercise)  144/60    Blood Pressure (Exit)  138/78    Heart Rate (Admit)  63 bpm    Heart Rate (Exercise)  122 bpm    Heart Rate (Exit)  92 bpm    Rating of Perceived Exertion (Exercise)  13    Symptoms  none    Duration  Continue with 30 min of aerobic exercise without signs/symptoms of physical distress.    Intensity  THRR unchanged      Progression   Progression  Continue to progress workloads to maintain intensity without signs/symptoms of physical distress.    Average METs  3.04      Resistance Training   Training Prescription  Yes    Weight  4 lbs    Reps  10-15      Interval Training   Interval Training  No      Treadmill   MPH  2.3    Grade  0.5    Minutes  15    METs  2.92      NuStep   Level  3    Minutes  15    METs  4.3      Recumbant Elliptical   Level  1    Minutes  15    METs  1.9      Elliptical   Level  1    Speed  4.1    Minutes  15      Home Exercise Plan   Plans to continue exercise at  Home (comment)   walking, biking   Frequency  Add 2 additional days to program exercise sessions.     Initial Home Exercises Provided  03/21/19       Nutrition:  Target Goals: Understanding of nutrition guidelines, daily intake of sodium <1560m, cholesterol <2021m calories 30% from fat and 7% or less from saturated fats, daily to have 5 or more servings of fruits and vegetables.  Biometrics: Pre Biometrics - 02/13/19 1433      Pre Biometrics   Height  5' 8.9" (1.75 m)    Weight  252 lb 3.2 oz (114.4 kg)    Waist Circumference  48 inches    Hip Circumference  44.25 inches    Waist to Hip Ratio  1.08 %    BMI (Calculated)  37.35    Single Leg Stand  22.5 seconds        Nutrition Therapy Plan and Nutrition Goals: Nutrition Therapy & Goals - 07/07/19 1510      Nutrition Therapy   Diet  Low Na HH diet    Drug/Food Interactions  Statins/Certain Fruits   Lipitor   Protein (specify units)  95g    Fiber  30 grams    Whole Grain Foods  3 servings    Saturated Fats  12 max. grams    Fruits and Vegetables  5 servings/day    Sodium  1.5 grams      Personal Nutrition Goals   Nutrition Goal  ST: navigate HH eating LT: get back into shape    Comments  Pt is on Lasixs (checks his legs every morning). Pt reports having good days and bad days. Pt wants help with his trip food list (pt provided - will review and disucss). Pt reports trying to choose lean meats and low sodium options as well as vegetables. Pt asked about asian cuisine and sodium intake; discussed that he could choose low sodium options and make some things himself, but in terms of sauce the biggest impact would be managing quantity. Discussed HH diet and low Na. Pt food list: B: eggs, bacon, coffee, juice. Oatmeal, prunes, brown sugar, coffee. cold cereal, fruit cup, powdered milk, coffee (granola, cheerios). pancakes, blueberry syrup, coffee. L: canned salmon or canned chicken and cheese wrap, chips, candy bar. Instant soup, nabs (peanut butter or cheese crackers), summer sausage, cookies. Cold cuts, apple, pickles, rolls.  Canned tuna or smoked oysters, nabs, cookies, mayo and pickles. D: country ham, pinto beans, cornbread, pudding. steak, instant potatoes, canned peas, cookies. spam/canned chicken, rice, string beans, blueberry or apple muffins, pineapple chunks. canned lasanga/ spaghetti, cheese, pregos sauce, muffins, pudding.[place] stew, muffins.      Intervention Plan   Intervention  Prescribe, educate and counsel regarding individualized specific dietary modifications aiming towards targeted core components such as weight, hypertension, lipid management, diabetes, heart failure and other comorbidities.;Nutrition handout(s) given to patient.    Expected Outcomes  Short Term Goal: Understand basic principles of dietary content, such as calories, fat, sodium, cholesterol and nutrients.;Short Term Goal: A plan has been developed with personal nutrition goals set during dietitian appointment.;Long Term Goal: Adherence to prescribed nutrition plan.       Nutrition Assessments: Nutrition Assessments - 02/13/19 1420      MEDFICTS Scores   Pre Score  78       Nutrition Goals Re-Evaluation:   Nutrition Goals Discharge (Final Nutrition Goals Re-Evaluation):   Psychosocial: Target Goals: Acknowledge presence or absence of significant depression and/or stress, maximize coping skills, provide positive support system. Participant is able to verbalize types and ability to use techniques and skills needed for reducing stress and depression.   Initial Review & Psychosocial Screening: Initial Psych Review & Screening - 02/13/19 1411      Initial Review   Current issues with  Current Stress Concerns    Comments  Cleburne did not go into detail about his stressors during orientation, but did say it has gotten better since his MD put him on Prozac in January. One of his top three goals is to learn how to manage stress  better      Family Dynamics   Good Support System?  Yes   spouse, son, friend     Barriers    Psychosocial barriers to participate in program  There are no identifiable barriers or psychosocial needs.;The patient should benefit from training in stress management and relaxation.      Screening Interventions   Interventions  Encouraged to exercise;Program counselor consult;Provide feedback about the scores to participant;To provide support and resources with identified psychosocial needs    Expected Outcomes  Short Term goal: Utilizing psychosocial counselor, staff and physician to assist with identification of specific Stressors or current issues interfering with healing process. Setting desired goal for each stressor or current issue identified.;Long Term Goal: Stressors or current issues are controlled or eliminated.;Short Term goal: Identification and review with participant of any Quality of Life or Depression concerns found by scoring the questionnaire.;Long Term goal: The participant improves quality of Life and PHQ9 Scores as seen by post scores and/or verbalization of changes       Quality of Life Scores:  Quality of Life - 02/13/19 1420      Quality of Life   Select  Quality of Life      Quality of Life Scores   Health/Function Pre  25.27 %    Socioeconomic Pre  30 %    Psych/Spiritual Pre  25.79 %    Family Pre  25.6 %    GLOBAL Pre  26.4 %      Scores of 19 and below usually indicate a poorer quality of life in these areas.  A difference of  2-3 points is a clinically meaningful difference.  A difference of 2-3 points in the total score of the Quality of Life Index has been associated with significant improvement in overall quality of life, self-image, physical symptoms, and general health in studies assessing change in quality of life.  PHQ-9: Recent Review Flowsheet Data    Depression screen [facility] 2/9 06/04/2019 02/13/2019 02/12/2019 05/23/2018 12/25/2017   Decreased Interest 0 0 0 0 0   Down, Depressed, Hopeless 1 0 0 1 0   PHQ - 2 Score 1 0 0 1 0   Altered sleeping - _0 -   Tired, decreased energy - 0 0 1 -   Change in appetite - 0 0 0 -   Feeling bad or failure about yourself  - 0 0 0 -   Trouble concentrating - 0 0 1 -   Moving slowly or fidgety/restless - 0 0 1 -   Suicidal thoughts - 0 0 0 -   PHQ-9 Score - _1 -   Difficult doing work/chores - Not difficult at all Not difficult at all Not difficult at all -     Interpretation of Total Score  Total Score Depression Severity:  1-4 = Minimal depression, 5-9 = Mild depression, 10-14 = Moderate depression, 15-19 = Moderately severe depression, 20-27 = Severe depression   Psychosocial Evaluation and Intervention:   Psychosocial Re-Evaluation: Psychosocial Re-Evaluation    Row Name 07/10/19 1429             Psychosocial Re-Evaluation   Current issues with  Current Psychotropic Meds;Current Anxiety/Panic;Current Stress Concerns       Comments  His son has been making some life decisions that has been attributing to his stress. Informed patient that it is not his fault and sometimes those issues are out of his control. He is taking porozac and has  been helping with his anxiety. Spoke to patient about continuing exercise to help reduce stress. He has a great attitude and wants to me able to do more after rehab.       Expected Outcomes  Short: continue medications and exercise. Long: reduce stressors to help his overall well being.       Interventions  Encouraged to attend Cardiac Rehabilitation for the exercise          Psychosocial Discharge (Final Psychosocial Re-Evaluation): Psychosocial Re-Evaluation - 07/10/19 1429      Psychosocial Re-Evaluation   Current issues with  Current Psychotropic Meds;Current Anxiety/Panic;Current Stress Concerns    Comments  His son has been making some life decisions that has been attributing to his stress. Informed patient that it is not his fault and sometimes those issues are out of his control. He is taking porozac and has been helping with his anxiety. Spoke  to patient about continuing exercise to help reduce stress. He has a great attitude and wants to me able to do more after rehab.    Expected Outcomes  Short: continue medications and exercise. Long: reduce stressors to help his overall well being.    Interventions  Encouraged to attend Cardiac Rehabilitation for the exercise       Vocational Rehabilitation: Provide vocational rehab assistance to qualifying candidates.   Vocational Rehab Evaluation & Intervention: Vocational Rehab - 02/13/19 1410      Initial Vocational Rehab Evaluation & Intervention   Assessment shows need for Vocational Rehabilitation  No       Education: Education Goals: Education classes will be provided on a variety of topics geared toward better understanding of heart health and risk factor modification. Participant will state understanding/return demonstration of topics presented as noted by education test scores.  Learning Barriers/Preferences: Learning Barriers/Preferences - 02/13/19 1407      Learning Barriers/Preferences   Learning Barriers  None    Learning Preferences  Group Instruction;Pictoral;Individual Instruction;Verbal Instruction;Skilled Demonstration       Education Topics:  AED/CPR: - Group verbal and written instruction with the use of models to demonstrate the basic use of the AED with the basic ABC's of resuscitation.   General Nutrition Guidelines/Fats and Fiber: -Group instruction provided by verbal, written material, models and posters to present the general guidelines for heart healthy nutrition. Gives an explanation and review of dietary fats and fiber.   Controlling Sodium/Reading Food Labels: -Group verbal and written material supporting the discussion of sodium use in heart healthy nutrition. Review and explanation with models, verbal and written materials for utilization of the food label.   Exercise Physiology & General Exercise Guidelines: - Group verbal and written  instruction with models to review the exercise physiology of the cardiovascular system and associated critical values. Provides general exercise guidelines with specific guidelines to those with heart or lung disease.    Aerobic Exercise & Resistance Training: - Gives group verbal and written instruction on the various components of exercise. Focuses on aerobic and resistive training programs and the benefits of this training and how to safely progress through these programs..   Flexibility, Balance, Mind/Body Relaxation: Provides group verbal/written instruction on the benefits of flexibility and balance training, including mind/body exercise modes such as yoga, pilates and tai chi.  Demonstration and skill practice provided.   Stress and Anxiety: - Provides group verbal and written instruction about the health risks of elevated stress and causes of high stress.  Discuss the correlation between heart/lung disease and anxiety and  treatment options. Review healthy ways to manage with stress and anxiety.   Depression: - Provides group verbal and written instruction on the correlation between heart/lung disease and depressed mood, treatment options, and the stigmas associated with seeking treatment.   Anatomy & Physiology of the Heart: - Group verbal and written instruction and models provide basic cardiac anatomy and physiology, with the coronary electrical and arterial systems. Review of Valvular disease and Heart Failure   Cardiac Procedures: - Group verbal and written instruction to review commonly prescribed medications for heart disease. Reviews the medication, class of the drug, and side effects. Includes the steps to properly store meds and maintain the prescription regimen. (beta blockers and nitrates)   Cardiac Medications I: - Group verbal and written instruction to review commonly prescribed medications for heart disease. Reviews the medication, class of the drug, and side  effects. Includes the steps to properly store meds and maintain the prescription regimen.   Cardiac Medications II: -Group verbal and written instruction to review commonly prescribed medications for heart disease. Reviews the medication, class of the drug, and side effects. (all other drug classes)    Go Sex-Intimacy & Heart Disease, Get SMART - Goal Setting: - Group verbal and written instruction through game format to discuss heart disease and the return to sexual intimacy. Provides group verbal and written material to discuss and apply goal setting through the application of the S.M.A.R.T. Method.   Other Matters of the Heart: - Provides group verbal, written materials and models to describe Stable Angina and Peripheral Artery. Includes description of the disease process and treatment options available to the cardiac patient.   Exercise & Equipment Safety: - Individual verbal instruction and demonstration of equipment use and safety with use of the equipment.   Cardiac Rehab from 02/13/2019 in [facility] Cardiac and Pulmonary Rehab  Date  02/13/19  Educator  [facility]  Instruction Review Code  1- Verbalizes Understanding      Infection Prevention: - Provides verbal and written material to individual with discussion of infection control including proper hand washing and proper equipment cleaning during exercise session.   Cardiac Rehab from 02/13/2019 in [facility] Cardiac and Pulmonary Rehab  Date  02/13/19  Educator  [facility]  Instruction Review Code  1- Verbalizes Understanding      Falls Prevention: - Provides verbal and written material to individual with discussion of falls prevention and safety.   Cardiac Rehab from 02/13/2019 in [facility] Cardiac and Pulmonary Rehab  Date  02/13/19  Educator  [facility]  Instruction Review Code  1- Verbalizes Understanding      Diabetes: - Individual verbal and written instruction to review signs/symptoms of diabetes, desired ranges of glucose level fasting, after meals  and with exercise. Acknowledge that pre and post exercise glucose checks will be done for 3 sessions at entry of program.   Know Your Numbers and Risk Factors: -Group verbal and written instruction about important numbers in your health.  Discussion of what are risk factors and how they play a role in the disease process.  Review of Cholesterol, Blood Pressure, Diabetes, and BMI and the role they play in your overall health.   Sleep Hygiene: -Provides group verbal and written instruction about how sleep can affect your health.  Define sleep hygiene, discuss sleep cycles and impact of sleep habits. Review good sleep hygiene tips.    Other: -Provides group and verbal instruction on various topics (see comments)   Knowledge Questionnaire Score: Knowledge Questionnaire Score - 02/13/19 1409  Knowledge Questionnaire Score   Pre Score  24/26   correct answers reviewed with Axel. Focus on nutrition       Core Components/Risk Factors/Patient Goals at Admission: Personal Goals and Risk Factors at Admission - 02/13/19 1406      Core Components/Risk Factors/Patient Goals on Admission    Weight Management  Yes;Weight Loss    Intervention  Weight Management: Develop a combined nutrition and exercise program designed to reach desired caloric intake, while maintaining appropriate intake of nutrient and fiber, sodium and fats, and appropriate energy expenditure required for the weight goal.;Weight Management: Provide education and appropriate resources to help participant work on and attain dietary goals.;Weight Management/Obesity: Establish reasonable short term and long term weight goals.    Admit Weight  252 lb (114.3 kg)    Goal Weight: Short Term  248 lb (112.5 kg)    Goal Weight: Long Term  215 lb (97.5 kg)    Expected Outcomes  Short Term: Continue to assess and modify interventions until short term weight is achieved;Long Term: Adherence to nutrition and physical activity/exercise  program aimed toward attainment of established weight goal;Weight Loss: Understanding of general recommendations for a balanced deficit meal plan, which promotes 1-2 lb weight loss per week and includes a negative energy balance of [phone] kcal/d;Understanding recommendations for meals to include 15-35% energy as protein, 25-35% energy from fat, 35-60% energy from carbohydrates, less than 242m of dietary cholesterol, 20-35 gm of total fiber daily;Understanding of distribution of calorie intake throughout the day with the consumption of 4-5 meals/snacks    Hypertension  Yes    Intervention  Provide education on lifestyle modifcations including regular physical activity/exercise, weight management, moderate sodium restriction and increased consumption of fresh fruit, vegetables, and low fat dairy, alcohol moderation, and smoking cessation.;Monitor prescription use compliance.    Expected Outcomes  Short Term: Continued assessment and intervention until BP is < 140/958mHG in hypertensive participants. < 130/8084mG in hypertensive participants with diabetes, heart failure or chronic kidney disease.;Long Term: Maintenance of blood pressure at goal levels.    Lipids  Yes    Intervention  Provide education and support for participant on nutrition & aerobic/resistive exercise along with prescribed medications to achieve LDL <80m5mDL >40mg3m Expected Outcomes  Short Term: Participant states understanding of desired cholesterol values and is compliant with medications prescribed. Participant is following exercise prescription and nutrition guidelines.;Long Term: Cholesterol controlled with medications as prescribed, with individualized exercise RX and with personalized nutrition plan. Value goals: LDL < 80mg,58m > 40 mg.       Core Components/Risk Factors/Patient Goals Review:  Goals and Risk Factor Review    Row Name 07/10/19 1441             Core Components/Risk Factors/Patient Goals Review    Personal Goals Review  Weight Management/Obesity;Lipids;Hypertension       Review  Patient states he is taking his medications at home and checking his blood pressure. He was checking his blood pressure after lunch and was not resting enough for a true resting blood pressure. Informed patient to take his blood pressure everday at the same time for a week to get a true resting blood pressure.       Expected Outcomes  Short: check blood pressure daily. Long: maintain a healthy blood pressure.          Core Components/Risk Factors/Patient Goals at Discharge (Final Review):  Goals and Risk Factor Review - 07/10/19 1441  Core Components/Risk Factors/Patient Goals Review   Personal Goals Review  Weight Management/Obesity;Lipids;Hypertension    Review  Patient states he is taking his medications at home and checking his blood pressure. He was checking his blood pressure after lunch and was not resting enough for a true resting blood pressure. Informed patient to take his blood pressure everday at the same time for a week to get a true resting blood pressure.    Expected Outcomes  Short: check blood pressure daily. Long: maintain a healthy blood pressure.       ITP Comments: ITP Comments    Row Name 02/13/19 1339 02/28/19 1007 03/05/19 1337 04/15/19 1047 05/01/19 1604   ITP Comments  Med Review completed. Initial ITP created. Diagnosis can be found in [facility] 2/24  Our program is currently closed due to COVID-19.  We are communicating with patient via phone calls and emails.   30 day review completed. Continue with ITP unless directed changes by Medical Director at review  Theordore is still exercising at home - up to walking 3 miles at 3 mph.  He is still riding his bike alternate days.  He is keeping a log of his food.  I will email Melissa about nutrition consult.   Ahyan is riding his bike at [employer] and walking.  He is monitoring his BP at home at rest and during exercise.  I will send Lenna Sciara his  email as they havent been able to touch base via phone.   [place] Name 06/25/19 1334 07/02/19 1415 07/23/19 0602       ITP Comments  30 day review cycle restarting  after being closed since March 16 because of  Covid 19 pandemic. Program opened to patients on July 6. Not all have returned. ITP updated and sent to Medical Director for review,changes as needed and signature  First full day of exercise!  Patient was oriented to gym and equipment including functions, settings, policies, and procedures.  Patient's individual exercise prescription and treatment plan were reviewed.  All starting workloads were established based on the results of the 6 minute walk test done at initial orientation visit.  The plan for exercise progression was also introduced and progression will be customized based on patient's performance and goals.  30 Day Review Completed today. Continue with ITP unless changed by Medical Director review.  New to program        Comments:

## 2019-07-23 NOTE — Progress Notes (Signed)
Daily Session Note  Patient Details  Name: Jesse Norton MRN: [id number] Date of Birth: 10/27/1946 Referring Provider:     Cardiac Rehab from 02/13/2019 in [facility] Cardiac and Pulmonary Rehab  Referring Provider  End      Encounter Date: 07/23/2019  Check In:      Social History   Tobacco Use  Smoking Status Never Smoker  Smokeless Tobacco Never Used    Goals Met:  Independence with exercise equipment Exercise tolerated well No report of cardiac concerns or symptoms Strength training completed today  Goals Unmet:  Not Applicable  Comments: Pt able to follow exercise prescription today without complaint.  Will continue to monitor for progression.    Dr. Emily Filbert is Medical Director for [place] and [facility].

## 2019-07-24 ENCOUNTER — Ambulatory Visit: Payer: Medicare Other

## 2019-07-24 ENCOUNTER — Other Ambulatory Visit: Payer: Self-pay

## 2019-07-24 ENCOUNTER — Encounter: Payer: Medicare Other | Admitting: *Deleted

## 2019-07-24 DIAGNOSIS — Z955 Presence of coronary angioplasty implant and graft: Secondary | ICD-10-CM

## 2019-07-24 NOTE — Progress Notes (Signed)
Daily Session Note  Patient Details  Name: Jesse Norton MRN: [id number] Date of Birth: 12/05/1946 Referring Provider:     Cardiac Rehab from 02/13/2019 in [facility] Cardiac and Pulmonary Rehab  Referring Provider  End      Encounter Date: 07/24/2019  Check In: Session Check In - 07/24/19 1003      Check-In   Supervising physician immediately available to respond to emergencies  See telemetry face sheet for immediately available ER MD    Location  [facility]-Cardiac & Pulmonary Rehab    Staff Present  Heath Lark, RN, BSN, Lance Sell, BA, ACSM CEP, Exercise Physiologist;Laureen Owens Shark, BS, RRT, CPFT    Virtual Visit  No    Medication changes reported      No    Fall or balance concerns reported     No    Warm-up and Cool-down  Performed on first and last piece of equipment    Resistance Training Performed  Yes    VAD Patient?  No    PAD/SET Patient?  No      Pain Assessment   Currently in Pain?  No/denies          Social History   Tobacco Use  Smoking Status Never Smoker  Smokeless Tobacco Never Used    Goals Met:  Independence with exercise equipment Exercise tolerated well No report of cardiac concerns or symptoms Strength training completed today  Goals Unmet:  Not Applicable  Comments: Pt able to follow exercise prescription today without complaint.  Will continue to monitor for progression.    Dr. Emily Filbert is Medical Director for [place] and [facility].

## 2019-07-29 ENCOUNTER — Other Ambulatory Visit: Payer: Self-pay

## 2019-07-29 DIAGNOSIS — Z955 Presence of coronary angioplasty implant and graft: Secondary | ICD-10-CM | POA: Diagnosis not present

## 2019-07-29 NOTE — Progress Notes (Signed)
Daily Session Note  Patient Details  Name: RYMAN RATHGEBER MRN: [id number] Date of Birth: 07-23-1946 Referring Provider:     Cardiac Rehab from 02/13/2019 in [facility] Cardiac and Pulmonary Rehab  Referring Provider  End      Encounter Date: 07/29/2019  Check In:      Social History   Tobacco Use  Smoking Status Never Smoker  Smokeless Tobacco Never Used    Goals Met:  Independence with exercise equipment Exercise tolerated well No report of cardiac concerns or symptoms Strength training completed today  Goals Unmet:  Not Applicable  Comments: Pt able to follow exercise prescription today without complaint.  Will continue to monitor for progression.    Dr. Emily Filbert is Medical Director for [place] and [facility].

## 2019-07-30 ENCOUNTER — Encounter: Payer: Medicare Other | Admitting: *Deleted

## 2019-07-30 DIAGNOSIS — Z955 Presence of coronary angioplasty implant and graft: Secondary | ICD-10-CM

## 2019-07-30 NOTE — Progress Notes (Signed)
Daily Session Note  Patient Details  Name: Jesse Norton MRN: [id number] Date of Birth: 25-Aug-1946 Referring Provider:     Cardiac Rehab from 02/13/2019 in [facility] Cardiac and Pulmonary Rehab  Referring Provider  End      Encounter Date: 07/30/2019  Check In: Session Check In - 07/30/19 1411      Check-In   Supervising physician immediately available to respond to emergencies  See telemetry face sheet for immediately available ER MD    Location  [facility]-Cardiac & Pulmonary Rehab    Staff Present  Heath Lark, RN, BSN, CCRP;Jeanna Durrell BS, Exercise Physiologist;Amanda Oletta Darter, BA, ACSM CEP, Exercise Physiologist    Virtual Visit  No    Medication changes reported      No    Fall or balance concerns reported     No    Warm-up and Cool-down  Performed on first and last piece of equipment    Resistance Training Performed  Yes    VAD Patient?  No    PAD/SET Patient?  No      Pain Assessment   Currently in Pain?  No/denies          Social History   Tobacco Use  Smoking Status Never Smoker  Smokeless Tobacco Never Used    Goals Met:  Independence with exercise equipment Exercise tolerated well No report of cardiac concerns or symptoms Strength training completed today  Goals Unmet:  Not Applicable  Comments: Doing well with exercise progression   Dr. Emily Filbert is Medical Director for [place] and [facility].

## 2019-07-31 ENCOUNTER — Other Ambulatory Visit: Payer: Self-pay

## 2019-07-31 ENCOUNTER — Encounter: Payer: Medicare Other | Admitting: *Deleted

## 2019-07-31 ENCOUNTER — Encounter: Payer: Medicare Other | Admitting: Nurse Practitioner

## 2019-07-31 DIAGNOSIS — Z955 Presence of coronary angioplasty implant and graft: Secondary | ICD-10-CM

## 2019-07-31 NOTE — Progress Notes (Signed)
Daily Session Note  Patient Details  Name: Jesse Norton MRN: [id number] Date of Birth: 10-29-1946 Referring Provider:     Cardiac Rehab from 02/13/2019 in [facility] Cardiac and Pulmonary Rehab  Referring Provider  End      Encounter Date: 07/31/2019  Check In: Session Check In - 07/31/19 1005      Check-In   Supervising physician immediately available to respond to emergencies  See telemetry face sheet for immediately available ER MD    Location  [facility]-Cardiac & Pulmonary Rehab    Staff Present  Heath Lark, RN, BSN, CCRP;Jeanna Durrell BS, Exercise Physiologist;Amanda Oletta Darter, BA, ACSM CEP, Exercise Physiologist    Virtual Visit  No    Medication changes reported      No    Fall or balance concerns reported     No    Warm-up and Cool-down  Performed on first and last piece of equipment    Resistance Training Performed  Yes    VAD Patient?  No    PAD/SET Patient?  No      Pain Assessment   Currently in Pain?  No/denies          Social History   Tobacco Use  Smoking Status Never Smoker  Smokeless Tobacco Never Used    Goals Met:  Independence with exercise equipment Exercise tolerated well No report of cardiac concerns or symptoms Strength training completed today  Goals Unmet:  Not Applicable  Comments: Pt able to follow exercise prescription today without complaint.  Will continue to monitor for progression.    Dr. Emily Filbert is Medical Director for [place] and [facility].

## 2019-08-01 ENCOUNTER — Telehealth: Payer: Self-pay

## 2019-08-05 ENCOUNTER — Encounter: Payer: Medicare Other | Admitting: *Deleted

## 2019-08-05 ENCOUNTER — Other Ambulatory Visit: Payer: Self-pay

## 2019-08-05 DIAGNOSIS — Z955 Presence of coronary angioplasty implant and graft: Secondary | ICD-10-CM

## 2019-08-05 NOTE — Progress Notes (Signed)
Daily Session Note  Patient Details  Name: QUANTEL MCINTURFF MRN: [id number] Date of Birth: 09-Mar-1946 Referring Provider:     Cardiac Rehab from 02/13/2019 in [facility] Cardiac and Pulmonary Rehab  Referring Provider  End      Encounter Date: 08/05/2019  Check In: Session Check In - 08/05/19 1004      Check-In   Supervising physician immediately available to respond to emergencies  See telemetry face sheet for immediately available ER MD    Location  [facility]-Cardiac & Pulmonary Rehab    Staff Present  Jasper Loser BS, Exercise Physiologist;Amanda Oletta Darter, BA, ACSM CEP, Exercise Physiologist;Raissa Dam, RN, BSN, CCRP    Virtual Visit  No    Medication changes reported      No    Fall or balance concerns reported     No    Warm-up and Cool-down  Performed on first and last piece of equipment    Resistance Training Performed  Yes    VAD Patient?  No    PAD/SET Patient?  No      Pain Assessment   Currently in Pain?  No/denies          Social History   Tobacco Use  Smoking Status Never Smoker  Smokeless Tobacco Never Used    Goals Met:  Independence with exercise equipment Exercise tolerated well No report of cardiac concerns or symptoms Strength training completed today  Goals Unmet:  Not Applicable  Comments: Pt able to follow exercise prescription today without complaint.  Will continue to monitor for progression.    Dr. Emily Filbert is Medical Director for [place] and [facility].

## 2019-08-06 ENCOUNTER — Encounter: Payer: Medicare Other | Admitting: *Deleted

## 2019-08-06 DIAGNOSIS — Z955 Presence of coronary angioplasty implant and graft: Secondary | ICD-10-CM | POA: Diagnosis not present

## 2019-08-06 NOTE — Progress Notes (Signed)
Daily Session Note  Patient Details  Name: Jesse Norton MRN: [id number] Date of Birth: Apr 21, 1946 Referring Provider:     Cardiac Rehab from 02/13/2019 in [facility] Cardiac and Pulmonary Rehab  Referring Provider  End      Encounter Date: 08/06/2019  Check In: Session Check In - 08/06/19 1400      Check-In   Supervising physician immediately available to respond to emergencies  See telemetry face sheet for immediately available ER MD    Location  [facility]-Cardiac & Pulmonary Rehab    Staff Present  Renita Papa, RN Vickki Hearing, BA, ACSM CEP, Exercise Physiologist;Jeanna Durrell BS, Exercise Physiologist    Virtual Visit  No    Medication changes reported      No    Fall or balance concerns reported     No    Warm-up and Cool-down  Performed on first and last piece of equipment    Resistance Training Performed  Yes    VAD Patient?  No    PAD/SET Patient?  No      Pain Assessment   Currently in Pain?  No/denies          Social History   Tobacco Use  Smoking Status Never Smoker  Smokeless Tobacco Never Used    Goals Met:  Independence with exercise equipment Exercise tolerated well No report of cardiac concerns or symptoms Strength training completed today  Goals Unmet:  Not Applicable  Comments: Pt able to follow exercise prescription today without complaint.  Will continue to monitor for progression.    Dr. Emily Filbert is Medical Director for [place] and [facility].

## 2019-08-07 ENCOUNTER — Other Ambulatory Visit: Payer: Self-pay

## 2019-08-07 DIAGNOSIS — Z955 Presence of coronary angioplasty implant and graft: Secondary | ICD-10-CM

## 2019-08-07 NOTE — Progress Notes (Signed)
Daily Session Note  Patient Details  Name: Jesse Norton MRN: [id number] Date of Birth: 1946-08-09 Referring Provider:     Cardiac Rehab from 02/13/2019 in [facility] Cardiac and Pulmonary Rehab  Referring Provider  End      Encounter Date: 08/07/2019  Check In: Session Check In - 08/07/19 1019      Check-In   Supervising physician immediately available to respond to emergencies  See telemetry face sheet for immediately available ER MD    Location  [facility]-Cardiac & Pulmonary Rehab    Staff Present  Vida Rigger RN, Vickki Hearing, BA, ACSM CEP, Exercise Physiologist;Jeanna Durrell BS, Exercise Physiologist    Virtual Visit  No    Medication changes reported      No    Fall or balance concerns reported     No    Warm-up and Cool-down  Performed on first and last piece of equipment    Resistance Training Performed  Yes    VAD Patient?  No    PAD/SET Patient?  No      Pain Assessment   Currently in Pain?  No/denies    Multiple Pain Sites  No          Social History   Tobacco Use  Smoking Status Never Smoker  Smokeless Tobacco Never Used    Goals Met:  Independence with exercise equipment Exercise tolerated well No report of cardiac concerns or symptoms Strength training completed today  Goals Unmet:  Not Applicable  Comments: Pt able to follow exercise prescription today without complaint.  Will continue to monitor for progression.   Dr. Emily Filbert is Medical Director for [place] and [facility].

## 2019-08-12 ENCOUNTER — Encounter: Payer: Medicare Other | Attending: Internal Medicine

## 2019-08-12 ENCOUNTER — Ambulatory Visit: Payer: Medicare Other | Admitting: Urology

## 2019-08-12 ENCOUNTER — Other Ambulatory Visit: Payer: Self-pay

## 2019-08-12 DIAGNOSIS — Z955 Presence of coronary angioplasty implant and graft: Secondary | ICD-10-CM | POA: Diagnosis not present

## 2019-08-12 NOTE — Progress Notes (Signed)
Daily Session Note  Patient Details  Name: Jesse Norton MRN: [id number] Date of Birth: 02/07/1946 Referring Provider:     Cardiac Rehab from 02/13/2019 in [facility] Cardiac and Pulmonary Rehab  Referring Provider  End      Encounter Date: 08/12/2019  Check In:      Social History   Tobacco Use  Smoking Status Never Smoker  Smokeless Tobacco Never Used    Goals Met:  Independence with exercise equipment Exercise tolerated well No report of cardiac concerns or symptoms Strength training completed today  Goals Unmet:  Not Applicable  Comments: Pt able to follow exercise prescription today without complaint.  Will continue to monitor for progression.    Dr. Mark Miller is Medical Director for [facility] and [facility]. 

## 2019-08-13 ENCOUNTER — Other Ambulatory Visit: Payer: Self-pay

## 2019-08-13 ENCOUNTER — Encounter: Payer: Medicare Other | Admitting: *Deleted

## 2019-08-13 DIAGNOSIS — Z955 Presence of coronary angioplasty implant and graft: Secondary | ICD-10-CM | POA: Diagnosis not present

## 2019-08-13 NOTE — Progress Notes (Signed)
Daily Session Note  Patient Details  Name: Jesse Norton MRN: [id number] Date of Birth: 12-19-1945 Referring Provider:     Cardiac Rehab from 02/13/2019 in [facility] Cardiac and Pulmonary Rehab  Referring Provider  End      Encounter Date: 08/13/2019  Check In: Session Check In - 08/13/19 1359      Check-In   Supervising physician immediately available to respond to emergencies  See telemetry face sheet for immediately available ER MD    Location  [facility]-Cardiac & Pulmonary Rehab    Staff Present  Renita Papa, RN Vickki Hearing, BA, ACSM CEP, Exercise Physiologist;Jesse Tessie Fass RCP,RRT,BSRT    Virtual Visit  No    Medication changes reported      No    Fall or balance concerns reported     No    Warm-up and Cool-down  Performed on first and last piece of equipment    Resistance Training Performed  Yes    VAD Patient?  No    PAD/SET Patient?  No      Pain Assessment   Currently in Pain?  No/denies          Social History   Tobacco Use  Smoking Status Never Smoker  Smokeless Tobacco Never Used    Goals Met:  Independence with exercise equipment Exercise tolerated well No report of cardiac concerns or symptoms Strength training completed today  Goals Unmet:  Not Applicable  Comments: Pt able to follow exercise prescription today without complaint.  Will continue to monitor for progression.    Dr. Emily Filbert is Medical Director for [place] and [facility].

## 2019-08-14 ENCOUNTER — Encounter: Payer: Medicare Other | Admitting: *Deleted

## 2019-08-14 DIAGNOSIS — Z955 Presence of coronary angioplasty implant and graft: Secondary | ICD-10-CM | POA: Diagnosis not present

## 2019-08-14 NOTE — Progress Notes (Signed)
Daily Session Note  Patient Details  Name: Jesse Norton MRN: [id number] Date of Birth: 17-Nov-1946 Referring Provider:     Cardiac Rehab from 02/13/2019 in [facility]  Referring Provider  End      Encounter Date: 08/14/2019  Check In: Session Check In - 08/14/19 1045      Check-In   Supervising physician immediately available to respond to emergencies  See telemetry face sheet for immediately available ER MD    Location  [facility]-Cardiac & Pulmonary Rehab    Staff Present  Heath Lark, RN, BSN, CCRP;Jeanna Durrell BS, Exercise Physiologist;Amanda Oletta Darter, BA, ACSM CEP, Exercise Physiologist    Virtual Visit  No    Medication changes reported      No    Fall or balance concerns reported     No    Warm-up and Cool-down  Performed on first and last piece of equipment    Resistance Training Performed  Yes    VAD Patient?  No    PAD/SET Patient?  No      Pain Assessment   Currently in Pain?  No/denies          Social History   Tobacco Use  Smoking Status Never Smoker  Smokeless Tobacco Never Used    Goals Met:  Independence with exercise equipment Exercise tolerated well No report of cardiac concerns or symptoms Strength training completed today  Goals Unmet:  Not Applicable  Comments: Pt able to follow exercise prescription today without complaint.  Will continue to monitor for progression.    Dr. Emily Filbert is Medical Director for [facility].

## 2019-08-19 ENCOUNTER — Encounter: Payer: Self-pay | Admitting: Nurse Practitioner

## 2019-08-19 ENCOUNTER — Ambulatory Visit (INDEPENDENT_AMBULATORY_CARE_PROVIDER_SITE_OTHER): Payer: Medicare Other | Admitting: Nurse Practitioner

## 2019-08-19 ENCOUNTER — Other Ambulatory Visit: Payer: Self-pay

## 2019-08-19 ENCOUNTER — Ambulatory Visit (INDEPENDENT_AMBULATORY_CARE_PROVIDER_SITE_OTHER): Payer: Medicare Other | Admitting: Physician Assistant

## 2019-08-19 ENCOUNTER — Encounter: Payer: Self-pay | Admitting: Physician Assistant

## 2019-08-19 VITALS — BP 128/88 | HR 84 | Temp 98.0°F | Ht 67.5 in | Wt 258.0 lb

## 2019-08-19 VITALS — BP 138/80 | HR 68 | Ht 71.0 in | Wt 258.2 lb

## 2019-08-19 DIAGNOSIS — I2510 Atherosclerotic heart disease of native coronary artery without angina pectoris: Secondary | ICD-10-CM | POA: Diagnosis not present

## 2019-08-19 DIAGNOSIS — N400 Enlarged prostate without lower urinary tract symptoms: Secondary | ICD-10-CM

## 2019-08-19 DIAGNOSIS — E785 Hyperlipidemia, unspecified: Secondary | ICD-10-CM | POA: Diagnosis not present

## 2019-08-19 DIAGNOSIS — I25118 Atherosclerotic heart disease of native coronary artery with other forms of angina pectoris: Secondary | ICD-10-CM | POA: Diagnosis not present

## 2019-08-19 DIAGNOSIS — M10072 Idiopathic gout, left ankle and foot: Secondary | ICD-10-CM | POA: Diagnosis not present

## 2019-08-19 DIAGNOSIS — F339 Major depressive disorder, recurrent, unspecified: Secondary | ICD-10-CM | POA: Diagnosis not present

## 2019-08-19 DIAGNOSIS — I10 Essential (primary) hypertension: Secondary | ICD-10-CM

## 2019-08-19 DIAGNOSIS — K219 Gastro-esophageal reflux disease without esophagitis: Secondary | ICD-10-CM

## 2019-08-19 DIAGNOSIS — I2583 Coronary atherosclerosis due to lipid rich plaque: Secondary | ICD-10-CM

## 2019-08-19 DIAGNOSIS — I5032 Chronic diastolic (congestive) heart failure: Secondary | ICD-10-CM | POA: Diagnosis not present

## 2019-08-19 DIAGNOSIS — Z0000 Encounter for general adult medical examination without abnormal findings: Secondary | ICD-10-CM | POA: Diagnosis not present

## 2019-08-19 LAB — URINALYSIS, ROUTINE W REFLEX MICROSCOPIC
Bilirubin, UA: NEGATIVE
Glucose, UA: NEGATIVE
Ketones, UA: NEGATIVE
Leukocytes,UA: NEGATIVE
Nitrite, UA: NEGATIVE
Protein,UA: NEGATIVE
Specific Gravity, UA: 1.01 (ref 1.005–1.030)
Urobilinogen, Ur: 0.2 mg/dL (ref 0.2–1.0)
pH, UA: 5 (ref 5.0–7.5)

## 2019-08-19 LAB — MICROSCOPIC EXAMINATION
Bacteria, UA: NONE SEEN
Epithelial Cells (non renal): NONE SEEN /hpf (ref 0–10)
WBC, UA: NONE SEEN /hpf (ref 0–5)

## 2019-08-19 MED ORDER — CLOPIDOGREL BISULFATE 75 MG PO TABS: 75.0000 mg | ORAL_TABLET | Freq: Every day | ORAL | 3 refills | Status: DC

## 2019-08-19 NOTE — Assessment & Plan Note (Signed)
Chronic, ongoing.  Continue current medication and adjust as needed.  Labs today.

## 2019-08-19 NOTE — Progress Notes (Signed)
Office Visit    Patient Name: Jesse Norton Date of Encounter: 08/19/2019  Primary Care Provider:  [name], MD Primary Cardiologist:  Nelva Bush, MD  Chief Complaint    73 yo male with PMH of CAD, HFpEF, HTN, Barrett's esophagus, gout, BPH, gastritis, and colon polyps here for follow-up of CAD.    Past Medical History    Past Medical History:  Diagnosis Date   (HFpEF) heart failure with preserved ejection fraction ([place])    a. 01/2019 Cath: EF 55-60% w/ elevated LVEDP.   Anxiety    Barrett's esophagus    CAD (coronary artery disease)    a. 10/2018 ETT: 42mm horizontal inflat ST depression, freq PVC's->Intermediate; b. 12/2018 Cardiac CTA: LM nl, LAD FFRct 0.6 in mLAD, LCX FFRct 0.99p, 0.73m, 0.77d, RCA no signif dzs, FFRct 0.89d->Rec cath; c. 01/2019 PCI: LM nl, LAD 40p, 81m, D2 70, LCX 70p (2.75x15 Resolute Onyx DES), RCA min irregs. EF 55-65%. EDP 25-95mmHg; d. 01/2019 PCI: LAD 80-90 (atherectomy & 3x15 Resolute Onyx DES).   Colon polyps    Duodenal adenoma    Gastritis    GERD (gastroesophageal reflux disease)    Gout    H. pylori infection    H/O urethral stricture    Hemorrhoids    Hiatal hernia    Hypertension    Stomach ulcer    Past Surgical History:  Procedure Laterality Date   COLONOSCOPY     COLONOSCOPY WITH PROPOFOL N/A 08/20/2018   Procedure: COLONOSCOPY WITH PROPOFOL;  Surgeon: Lollie Sails, MD;  Location: [facility] ENDOSCOPY;  Service: Endoscopy;  Laterality: N/A;   COLONOSCOPY WITH PROPOFOL N/A 12/19/2018   Procedure: COLONOSCOPY WITH PROPOFOL;  Surgeon: Lollie Sails, MD;  Location: [facility] ENDOSCOPY;  Service: Endoscopy;  Laterality: N/A;   CORONARY ATHERECTOMY N/A 02/03/2019   Procedure: CORONARY ATHERECTOMY;  Surgeon: Nelva Bush, MD;  Location: [place] CV LAB;  Service: Cardiovascular;  Laterality: N/A;   CORONARY STENT INTERVENTION N/A 01/21/2019   Procedure: CORONARY STENT INTERVENTION;  Surgeon: Nelva Bush, MD;  Location: [place] CV LAB;  Service: Cardiovascular;  Laterality: N/A;   CORONARY STENT INTERVENTION N/A 02/03/2019   Procedure: CORONARY STENT INTERVENTION;  Surgeon: Nelva Bush, MD;  Location: [place] CV LAB;  Service: Cardiovascular;  Laterality: N/A;   CYSTOSCOPY WITH URETHRAL DILATATION N/A 08/16/2018   Procedure: CYSTOSCOPY WITH URETHRAL DILATATION;  Surgeon: [name], MD;  Location: [facility] ORS;  Service: Urology;  Laterality: N/A;   ESOPHAGOGASTRODUODENOSCOPY N/A 01/29/2017   Procedure: ESOPHAGOGASTRODUODENOSCOPY (EGD);  Surgeon: Lollie Sails, MD;  Location: [facility] ENDOSCOPY;  Service: Endoscopy;  Laterality: N/A;   ESOPHAGOGASTRODUODENOSCOPY (EGD) WITH PROPOFOL N/A 07/10/2016   Procedure: ESOPHAGOGASTRODUODENOSCOPY (EGD) WITH PROPOFOL;  Surgeon: Lollie Sails, MD;  Location: [facility] ENDOSCOPY;  Service: Endoscopy;  Laterality: N/A;   ESOPHAGOGASTRODUODENOSCOPY (EGD) WITH PROPOFOL N/A 12/19/2018   Procedure: ESOPHAGOGASTRODUODENOSCOPY (EGD) WITH PROPOFOL;  Surgeon: Lollie Sails, MD;  Location: [facility] ENDOSCOPY;  Service: Endoscopy;  Laterality: N/A;   gastritis     h.pylori     INTRAVASCULAR ULTRASOUND/IVUS N/A 02/03/2019   Procedure: Intravascular Ultrasound/IVUS;  Surgeon: Nelva Bush, MD;  Location: [place] CV LAB;  Service: Cardiovascular;  Laterality: N/A;   left elbow repair Left    LEFT HEART CATH AND CORONARY ANGIOGRAPHY Left 01/21/2019   Procedure: LEFT HEART CATH AND CORONARY ANGIOGRAPHY;  Surgeon: Nelva Bush, MD;  Location: [place] CV LAB;  Service: Cardiovascular;  Laterality: Left;   SPLENECTOMY  1957   TONSILLECTOMY      Allergies  No Known Allergies  History of Present Illness    He has a history of chest pain that dates back several years with 2 emergency department visits in the past.  In July 2018, he was evaluated at [facility] emergency department with left-sided chest pain that radiated to his  arm.  Associated symptoms included nausea and DOE.  ER evaluation unremarkable.  He was referred to Dr. Saunders Revel, who saw him 09/2018 and subsequently underwent exercise treadmill testing 10/2018 with good exercise tolerance but notable for 1 mm horizontal inferior lateral ST segment depression and frequent PVCs.  Cardiac CTA was recommended in the setting.  This showed significant mid LAD disease with fractional flow reserve of 0.6.  He underwent diagnostic catheterization showing severe calcified LAD disease as well as circumflex disease.  He received PCI with DES, and 2 weeks later, he underwent orbital atherectomy of the LAD with successful placement of a drug-eluting stent.  He was seen at hospital follow-up and doing well with continued increase in exercise.  He noted 2 episodes of brief chest pain that lasted for only a second or two and reportedly different from that of before his PCI.   Since that time, the patient has been doing well from a cardiac standpoint. He noted that he continues to have atypical chest pain episodes with substernal, non-radiating, non-pleuritic CP that lasts only a second and is different from the symptoms prior to his stenting. He denied any racing HR or palpitations. He noted DOE with extreme exertion and that his energy was at approximately 85% and continuing to improve since the stenting. He continued to remain active with biking and hiking, noting that [place] with [place] local trail that is over 2 miles in length. He bikes and hikes on those days that he is not in cardiac rehab. He noted that he also occasionally will use a bike that is "on rollers" and on his screened in porch, so that he can watch the squirrels while he is biking. He denied any exertional CP during this activity. He continues to eat healthy with increased fiber, fruits, and vegetables, in addition to his protein. He did note he eats out when they go to [employer], such as when they get oysters;  however, he has been trying to get boiled or blackened fish in an attempt to avoid fried and salty foods. He noted adding Chia seeds to his diet, which has helped alleviate his GERD. He also noted that he has raised the head of the bed to alleviate his GERD and denied raising the head of the bed due to orthopnea. He denied early satiety, LEE, PND, and other symptoms of heart failure. No signs or symptoms of acute bleeding on DAPT including melena, hematochezia, or hemoptysis. He confirmed medication compliance. He reported that he takes his BP at home at approximately 12PM, at which time his SBP normally runs in the 130s (but less than 140/90). He stated that his BP always runs lower when taken at cardiac rehab (these readings not available on review of EMR). He reported that he had to cancel his hiking trip to [place], as he did not feel it would be safe to stop at gas stations, restaurants, and hotels given the COVID-19 pandemic. He plans to go twice next year to make up for missing this year.   Previous labs: 06/12/2019: Na 139, K 4.2, Cr 0.96, BUN 19;  Hgb 14.1, RBC 4.10 02/12/2019: AST 38, ALT 30 02/12/2019: LDL 55, HDL 58, TG 58, total 124  Home Medications    Prior to Admission medications   Medication Sig Start Date End Date Taking? Authorizing Provider  allopurinol (ZYLOPRIM) 300 MG tablet Take 1 tablet (300 mg total) by mouth daily. 06/30/19   [name], MD  amLODipine (NORVASC) 10 MG tablet Take 1 tablet (10 mg total) by mouth daily. 06/30/19   [name], MD  aspirin EC 81 MG tablet Take 81 mg by mouth daily.    [provider]  atorvastatin (LIPITOR) 40 MG tablet Take 1 tablet (40 mg total) by mouth daily. 06/30/19   [name], MD  Bioflavonoid Products (BIOFLEX PO) Take 1 tablet by mouth 3 (three) times a week.     [provider]  clopidogrel (PLAVIX) 75 MG tablet Take 1 tablet (75 mg total) by mouth daily with breakfast. 01/23/19   Marrianne Mood D, PA-C    FLUoxetine (PROZAC) 20 MG capsule Take 1 capsule (20 mg total) by mouth daily. 06/30/19   [name], MD  furosemide (LASIX) 20 MG tablet Take 1 tablet (20 mg total) by mouth daily for 30 days. Patient taking differently: Take 20 mg by mouth daily. Daily 02/14/19 06/04/19  Theora Gianotti, NP  LORazepam (ATIVAN) 1 MG tablet Take 0.5-1 tablets (0.5-1 mg total) by mouth daily as needed for anxiety. 06/30/19   [name], MD  losartan (COZAAR) 100 MG tablet Take 1 tablet (100 mg total) by mouth daily. 06/30/19   [name], MD  metoprolol tartrate (LOPRESSOR) 25 MG tablet Take 1 tablet (25 mg total) by mouth 2 (two) times daily. 06/30/19 06/29/20  [name], MD  Multiple Vitamin (MULTIVITAMIN) tablet Take 1 tablet by mouth daily.    [provider]  Multiple Vitamins-Minerals (EMERGEN-C IMMUNE PO) Take 1 tablet by mouth every other day.     [provider]  pantoprazole (PROTONIX) 20 MG tablet Take 1 tablet (20 mg total) by mouth 2 (two) times daily. 06/30/19   [name], MD    Review of Systems    He denies palpitations, pnd, orthopnea, n, v, dizziness, syncope, edema, weight gain, or early satiety. He continues to note brief sharp atypical chest pain, lasting only 1 minute and different than before his stenting as above. Only notes DOE with extreme activity, which he feels is not atypical as above. All other systems reviewed and are otherwise negative except as noted above.  Physical Exam    VS:  BP 138/80 (BP Location: Left Arm, Patient Position: Sitting, Cuff Size: Large)    Pulse 68    Ht 5\' 11"  (1.803 m)    Wt 258 lb 4 oz (117.1 kg)    BMI 36.02 kg/m  , BMI Body mass index is 36.02 kg/m. GEN: Well nourished, well developed, in no acute distress. HEENT: normal. Neck: Supple, no JVD, carotid bruits, or masses. Cardiac: RRR, no murmurs, rubs, or gallops. No clubbing, cyanosis. Trace dependent bilateral LEE.  Radials/DP/PT 1+ on right and 2+  on left.  Respiratory:  Respirations regular and unlabored, clear to auscultation bilaterally. GI: Soft, nontender, nondistended, BS + x 4. MS: no deformity or atrophy. Skin: warm and dry, no rash. Neuro:  Strength and sensation are intact. Psych: Normal affect.  Accessory Clinical Findings    ECG personally reviewed by me today - EKG shows NSR, 68 bpm, no acute changes from  previous   The following studies were reviewed today:  PCI (02/03/2019): 1. Severe, heavily calcified mid LAD stenosis (80-90%), unchanged from diagnostic catheterization earlier this month. 2. Widely patent LCx stent. 3. Normal left ventricular filling pressure. 4. Successful orbital atherectomy and IVUS-guided PCI to the mid LAD using a Resolute Onyx 3.0 x 15 mm drug-eluting stent (postdilated to 3.6 mm) with 0% residual stenosis and TIMI-3 flow.  LHC/PCI (01/21/2019): 1. Significant 2-vessel coronary artery disease, including 80-90% mid LAD stenosis with heavy calcification, as well as 70% proximal LCx stenosis. 2. Normal left ventricular systolic function. 3. Moderately elevated left ventricular filling pressure. 4. Successful PCI to proximal LCx using Resolute Onyx 2.75 x 15 mm drug-eluting stent (postdilated to 3.1 mm proximally) with 0% residual stenosis and TIMI-3 flow.  Cardiac CTA (12/23/2018): Multivessel CAD with hemodynamically significant disease involving the mid LAD and distal LCx.  Exercise tolerance test (11/06/2018): Intermediate risk exercise tolerance test with 1 mm horizontal ST depressions and frequent PVC's during stress.   Assessment & Plan    CAD --S/p 2v PCI 01/2019 with DES to LCx followed by atherectomy and DES to LAD. He continues to do well and is active in addition to cardiac rehab. He noted continued brief 1 second episodes of atypical CP not consistent with angina. Continue ASA, Plavix, Lopressor, CCB, ARB, Lipitor. We will refill his Plavix today, as long term DAPT  recommended given multivessel CAD/PCI. Continue DAPT with PPI, given h/o GERD.  HLD --Last LDL 02/2019 of 55 and at goal. Last ALT 30, AST 38. Continue to monitor LFTs / LDL on statin therapy. Continue statin therapy with goal LDL <70.  HTN --BP remains at upper limits of normal at today's visit with patient reporting lower SBP at cardiac rehab. Reports resting HR in the 60s, lower SBP readings at cardiac rehab. Today's visit SBP 138 and mildly elevated (similar to 02/2019 clinic visit). Instructed patient to check his BP daily at the same time and call the office this week if he notices consistently elevated BP with preference for SBP 120s. Continue current medications for now with lifestyle modifications. If BP remains elevated per patient home BP checks (will call into the office), consider changing Lopressor to Coreg for more optimal BP control.   HFpEF --01/2019 Cath with EF 55-60%, elevated LVEDP. No s/sx of worsening heart failure on today's exam. Euvolemic on exam with wt decrease from 06/2019 of 261lb  258lb today. BP slightly elevated today with recommendation to closely monitor and call if SBP consistently elevated at home as above. For now, continue medical management as above with Lasix 20mg  daily. If BP consistently elevated, consider transition from Lopressor to [place]. Most recent renal function and electrolytes 06/2019 and stable as above.   Barrett's esophagus, GERD --Improved sx with PPI and raising the head of the bed. No s/sx of GIB on DAPT as above in HPI. Continue PPI and dietary changes.  Morbid obesity --Continue dietary and lifestyle changes. Continue active lifestyle.    Disposition: Refill Plavix, follow-up in 6 months (possible virtual appointment at that time).  Arvil Chaco, PA-C 08/19/2019, 2:54 PM

## 2019-08-19 NOTE — Patient Instructions (Signed)

## 2019-08-19 NOTE — Assessment & Plan Note (Signed)
Chronic, stable.  Continue current medication regimen and collaboration with cardiology. 

## 2019-08-19 NOTE — Assessment & Plan Note (Signed)
Chronic, stable.  Controlled with Allopurinol.  Continue current medication regimen.  Labs today.

## 2019-08-19 NOTE — Assessment & Plan Note (Signed)
Chronic, stable.  Denies SI/HI.  Continue current medication regimen and adjust as needed. 

## 2019-08-19 NOTE — Progress Notes (Signed)
BP 128/88   Pulse 84   Temp 98 F (36.7 C) (Oral)   Ht 5' 7.5" (1.715 m)   Wt 258 lb (117 kg)   SpO2 94%   BMI 39.81 kg/m    Subjective:    Patient ID: Jesse Norton, male    DOB: 04-12-1946, 73 y.o.   MRN: [id number]  HPI: Jesse Norton is a 73 y.o. male presenting on 08/19/2019 for comprehensive medical examination. Current medical complaints include:none  He currently lives with: wife Interim Problems from his last visit: no  Functional Status Survey: Is the patient deaf or have difficulty hearing?: No Does the patient have difficulty seeing, even when wearing glasses/contacts?: No Does the patient have difficulty concentrating, remembering, or making decisions?: No Does the patient have difficulty walking or climbing stairs?: No Does the patient have difficulty dressing or bathing?: No Does the patient have difficulty doing errands alone such as visiting a doctor's office or shopping?: No  FALL RISK: Fall Risk  06/04/2019 02/13/2019 12/30/2018 06/26/2018 05/23/2018  Falls in the past year? 0 0 0 No No  Injury with Fall? - 0 - - -  Follow up - - Falls evaluation completed - -    Depression Screen Depression screen [facility] 2/9 08/19/2019 06/04/2019 02/13/2019 02/12/2019 05/23/2018  Decreased Interest 0 0 0 0 0  Down, Depressed, Hopeless 0 1 0 0 1  PHQ - 2 Score 0 1 0 0 1  Altered sleeping 0 - 1 1 1   Tired, decreased energy 0 - 0 0 1  Change in appetite 0 - 0 0 0  Feeling bad or failure about yourself  0 - 0 0 0  Trouble concentrating 0 - 0 0 1  Moving slowly or fidgety/restless 0 - 0 0 1  Suicidal thoughts 0 - 0 0 0  PHQ-9 Score 0 - 1 1 5   Difficult doing work/chores Not difficult at all - Not difficult at all Not difficult at all Not difficult at all    Advanced Directives <no information>  Past Medical History:  Past Medical History:  Diagnosis Date  . (HFpEF) heart failure with preserved ejection fraction ([place])    a. 01/2019 Cath: EF 55-60% w/ elevated LVEDP.  Marland Kitchen  Anxiety   . Barrett's esophagus   . CAD (coronary artery disease)    a. 10/2018 ETT: 61mm horizontal inflat ST depression, freq PVC's->Intermediate; b. 12/2018 Cardiac CTA: LM nl, LAD FFRct 0.6 in mLAD, LCX FFRct 0.99p, 0.51m, 0.77d, RCA no signif dzs, FFRct 0.89d->Rec cath; c. 01/2019 PCI: LM nl, LAD 40p, 10m, D2 70, LCX 70p (2.75x15 Resolute Onyx DES), RCA min irregs. EF 55-65%. EDP 25-10mmHg; d. 01/2019 PCI: LAD 80-90 (atherectomy & 3x15 Resolute Onyx DES).  . Colon polyps   . Duodenal adenoma   . Gastritis   . GERD (gastroesophageal reflux disease)   . Gout   . H. pylori infection   . H/O urethral stricture   . Hemorrhoids   . Hiatal hernia   . Hypertension   . Stomach ulcer     Surgical History:  Past Surgical History:  Procedure Laterality Date  . COLONOSCOPY    . COLONOSCOPY WITH PROPOFOL N/A 08/20/2018   Procedure: COLONOSCOPY WITH PROPOFOL;  Surgeon: Lollie Sails, MD;  Location: [facility] ENDOSCOPY;  Service: Endoscopy;  Laterality: N/A;  . COLONOSCOPY WITH PROPOFOL N/A 12/19/2018   Procedure: COLONOSCOPY WITH PROPOFOL;  Surgeon: Lollie Sails, MD;  Location[facility] ENDOSCOPY;  Service: Endoscopy;  Laterality: N/A;  .  CORONARY ATHERECTOMY N/A 02/03/2019   Procedure: CORONARY ATHERECTOMY;  Surgeon: Nelva Bush, MD;  Location:  [place] CV LAB;  Service: Cardiovascular;  Laterality: N/A;  . CORONARY STENT INTERVENTION N/A 01/21/2019   Procedure: CORONARY STENT INTERVENTION;  Surgeon: Nelva Bush, MD;  Location: [place] CV LAB;  Service: Cardiovascular;  Laterality: N/A;  . CORONARY STENT INTERVENTION N/A 02/03/2019   Procedure: CORONARY STENT INTERVENTION;  Surgeon: Nelva Bush, MD;  Location: [place] CV LAB;  Service: Cardiovascular;  Laterality: N/A;  . CYSTOSCOPY WITH URETHRAL DILATATION N/A 08/16/2018   Procedure: CYSTOSCOPY WITH URETHRAL DILATATION;  Surgeon: [name], MD;  Location: [facility] ORS;  Service: Urology;  Laterality: N/A;  .  ESOPHAGOGASTRODUODENOSCOPY N/A 01/29/2017   Procedure: ESOPHAGOGASTRODUODENOSCOPY (EGD);  Surgeon: Lollie Sails, MD;  Location: [facility] ENDOSCOPY;  Service: Endoscopy;  Laterality: N/A;  . ESOPHAGOGASTRODUODENOSCOPY (EGD) WITH PROPOFOL N/A 07/10/2016   Procedure: ESOPHAGOGASTRODUODENOSCOPY (EGD) WITH PROPOFOL;  Surgeon: Lollie Sails, MD;  Location: [facility] ENDOSCOPY;  Service: Endoscopy;  Laterality: N/A;  . ESOPHAGOGASTRODUODENOSCOPY (EGD) WITH PROPOFOL N/A 12/19/2018   Procedure: ESOPHAGOGASTRODUODENOSCOPY (EGD) WITH PROPOFOL;  Surgeon: Lollie Sails, MD;  Location: [facility] ENDOSCOPY;  Service: Endoscopy;  Laterality: N/A;  . gastritis    . h.pylori    . INTRAVASCULAR ULTRASOUND/IVUS N/A 02/03/2019   Procedure: Intravascular Ultrasound/IVUS;  Surgeon: Nelva Bush, MD;  Location: [place] CV LAB;  Service: Cardiovascular;  Laterality: N/A;  . left elbow repair Left   . LEFT HEART CATH AND CORONARY ANGIOGRAPHY Left 01/21/2019   Procedure: LEFT HEART CATH AND CORONARY ANGIOGRAPHY;  Surgeon: Nelva Bush, MD;  Location: [place] CV LAB;  Service: Cardiovascular;  Laterality: Left;  . SPLENECTOMY  1957  . TONSILLECTOMY      Medications:  Current Outpatient Medications on File Prior to Visit  Medication Sig  . allopurinol (ZYLOPRIM) 300 MG tablet Take 1 tablet (300 mg total) by mouth daily.  Marland Kitchen amLODipine (NORVASC) 10 MG tablet Take 1 tablet (10 mg total) by mouth daily.  Marland Kitchen aspirin EC 81 MG tablet Take 81 mg by mouth daily.  Marland Kitchen atorvastatin (LIPITOR) 40 MG tablet Take 1 tablet (40 mg total) by mouth daily.  . clopidogrel (PLAVIX) 75 MG tablet Take 1 tablet (75 mg total) by mouth daily with breakfast.  . FLUoxetine (PROZAC) 20 MG capsule Take 1 capsule (20 mg total) by mouth daily.  Marland Kitchen LORazepam (ATIVAN) 1 MG tablet Take 0.5-1 tablets (0.5-1 mg total) by mouth daily as needed for anxiety.  Marland Kitchen losartan (COZAAR) 100 MG tablet Take 1 tablet (100 mg total) by mouth daily.  .  metoprolol tartrate (LOPRESSOR) 25 MG tablet Take 1 tablet (25 mg total) by mouth 2 (two) times daily.  . Multiple Vitamin (MULTIVITAMIN) tablet Take 1 tablet by mouth daily.  . Multiple Vitamins-Minerals (EMERGEN-C IMMUNE PO) Take 1 tablet by mouth every other day.   . pantoprazole (PROTONIX) 20 MG tablet Take 1 tablet (20 mg total) by mouth 2 (two) times daily.  . furosemide (LASIX) 20 MG tablet Take 1 tablet (20 mg total) by mouth daily for 30 days. (Patient taking differently: Take 20 mg by mouth daily. Daily)   No current facility-administered medications on file prior to visit.     Allergies:  No Known Allergies  Social History:  Social History   Socioeconomic History  . Marital status: Married    Spouse name: Not on file  . Number of children: Not on file  . Years of education: Not on file  .  Highest education level: Master's degree (e.g., MA, MS, MEng, MEd, MSW, MBA)  Occupational History  . Occupation: retired   Scientific laboratory technician  . Financial resource strain: Not hard at all  . Food insecurity    Worry: Never true    Inability: Never true  . Transportation needs    Medical: No    Non-medical: No  Tobacco Use  . Smoking status: Never Smoker  . Smokeless tobacco: Never Used  Substance and Sexual Activity  . Alcohol use: Yes    Alcohol/week: 5.0 standard drinks    Types: 5 Glasses of wine per week    Comment: OCCASIONAL  . Drug use: No  . Sexual activity: Not on file  Lifestyle  . Physical activity    Days per week: 4 days    Minutes per session: 60 min  . Stress: Not at all  Relationships  . Social connections    Talks on phone: More than three times a week    Gets together: More than three times a week    Attends religious service: More than 4 times per year    Active member of club or organization: Yes    Attends meetings of clubs or organizations: More than 4 times per year    Relationship status: Married  . Intimate partner violence    Fear of current or  ex partner: No    Emotionally abused: No    Physically abused: No    Forced sexual activity: No  Other Topics Concern  . Not on file  Social History Narrative   Rides bicycle with friends       No smoking.  Occasional alcohol.  Lives at home.  Retired Customer service manager.   Social History   Tobacco Use  Smoking Status Never Smoker  Smokeless Tobacco Never Used   Social History   Substance and Sexual Activity  Alcohol Use Yes  . Alcohol/week: 5.0 standard drinks  . Types: 5 Glasses of wine per week   Comment: OCCASIONAL    Family History:  Family History  Adopted: Yes  Problem Relation Age of Onset  . Heart attack Mother 56  . Pancreatic cancer Father        died in WWII - pt adopted  . Hypertension Sister   . Hyperlipidemia Sister   . Hypertension Brother   . Hyperlipidemia Brother   . Prostate cancer Neg Hx   . Kidney cancer Neg Hx   . Bladder Cancer Neg Hx     Past medical history, surgical history, medications, allergies, family history and social history reviewed with patient today and changes made to appropriate areas of the chart.   Review of Systems - negative All other ROS negative except what is listed above and in the HPI.      Objective:    BP 128/88   Pulse 84   Temp 98 F (36.7 C) (Oral)   Ht 5' 7.5" (1.715 m)   Wt 258 lb (117 kg)   SpO2 94%   BMI 39.81 kg/m   Wt Readings from Last 3 Encounters:  08/19/19 258 lb (117 kg)  08/19/19 258 lb 4 oz (117.1 kg)  06/12/19 261 lb 8 oz (118.6 kg)    Physical Exam Vitals signs and nursing note reviewed.  Constitutional:      General: He is awake. He is not in acute distress.    Appearance: He is well-developed. He is obese. He is not ill-appearing.  HENT:     Head: Normocephalic  and atraumatic.     Right Ear: Hearing, tympanic membrane, ear canal and external ear normal. No drainage.     Left Ear: Hearing, tympanic membrane, ear canal and external ear normal. No drainage.     Nose: Nose normal.      Mouth/Throat:     Mouth: Mucous membranes are moist.     Pharynx: Uvula midline.  Eyes:     General: Lids are normal.        Right eye: No discharge.        Left eye: No discharge.     Extraocular Movements: Extraocular movements intact.     Conjunctiva/sclera: Conjunctivae normal.     Pupils: Pupils are equal, round, and reactive to light.     Visual Fields: Right eye visual fields normal and left eye visual fields normal.  Neck:     Musculoskeletal: Normal range of motion and neck supple.     Thyroid: No thyromegaly.     Vascular: No carotid bruit.     Trachea: Trachea normal.  Cardiovascular:     Rate and Rhythm: Normal rate and regular rhythm.     Heart sounds: Normal heart sounds, S1 normal and S2 normal. No murmur. No gallop.   Pulmonary:     Effort: Pulmonary effort is normal. No accessory muscle usage or respiratory distress.     Breath sounds: Normal breath sounds.  Abdominal:     General: Bowel sounds are normal.     Palpations: Abdomen is soft. There is no hepatomegaly or splenomegaly.     Tenderness: There is no abdominal tenderness.     Hernia: There is no hernia in the left inguinal area or right inguinal area.  Genitourinary:    Penis: Normal.      Scrotum/Testes: Normal.     Epididymis:     Right: Normal.     Left: Normal.     Prostate: Normal.     Rectum: Normal.  Musculoskeletal: Normal range of motion.     Right lower leg: 1+ Edema present.     Left lower leg: 1+ Edema present.  Lymphadenopathy:     Head:     Right side of head: No submental, submandibular, tonsillar, preauricular or posterior auricular adenopathy.     Left side of head: No submental, submandibular, tonsillar, preauricular or posterior auricular adenopathy.     Cervical: No cervical adenopathy.  Skin:    General: Skin is warm and dry.     Capillary Refill: Capillary refill takes less than 2 seconds.     Findings: No rash.  Neurological:     Mental Status: He is alert and oriented to  person, place, and time.     Cranial Nerves: Cranial nerves are intact.     Gait: Gait is intact.     Deep Tendon Reflexes: Reflexes are normal and symmetric.     Reflex Scores:      Brachioradialis reflexes are 2+ on the right side and 2+ on the left side.      Patellar reflexes are 2+ on the right side and 2+ on the left side. Psychiatric:        Attention and Perception: Attention normal.        Mood and Affect: Mood normal.        Speech: Speech normal.        Behavior: Behavior normal. Behavior is cooperative.        Thought Content: Thought content normal.  Cognition and Memory: Cognition normal.        Judgment: Judgment normal.      6CIT Screen 08/19/2019 06/04/2019 05/23/2018 05/18/2017  What Year? 0 points 0 points 0 points 0 points  What month? 0 points 0 points 0 points 0 points  What time? 0 points 0 points 0 points 0 points  Count back from 20 0 points 0 points 0 points 0 points  Months in reverse 0 points 0 points 0 points 0 points  Repeat phrase 0 points 0 points 0 points 0 points  Total Score 0 0 0 0   Results for orders placed or performed in visit on 08/19/19  Microscopic Examination   URINE  Result Value Ref Range   WBC, UA None seen 0 - 5 /hpf   RBC 0-2 0 - 2 /hpf   Epithelial Cells (non renal) None seen 0 - 10 /hpf   Bacteria, UA None seen None seen/Few  Urinalysis, Routine w reflex microscopic  Result Value Ref Range   Specific Gravity, UA 1.010 1.005 - 1.030   pH, UA 5.0 5.0 - 7.5   Color, UA Yellow Yellow   Appearance Ur Clear Clear   Leukocytes,UA Negative Negative   Protein,UA Negative Negative/Trace   Glucose, UA Negative Negative   Ketones, UA Negative Negative   RBC, UA 1+ (A) Negative   Bilirubin, UA Negative Negative   Urobilinogen, Ur 0.2 0.2 - 1.0 mg/dL   Nitrite, UA Negative Negative   Microscopic Examination See below:       Assessment & Plan:   Problem List Items Addressed This Visit      Cardiovascular and Mediastinum    Essential hypertension    Chronic, stable with BP at goal.  Continue current medication regimen and adjust as needed.  Labs today.      Relevant Orders   Microscopic Examination (Completed)   Coronary artery disease with stable angina pectoris (HCC)    Chronic, stable.  Continue current medication regimen and collaboration with cardiology.        Genitourinary   BPH (benign prostatic hyperplasia)    Chronic, ongoing,  Well-controlled without medication at this time.  Continue to monitor.        Other   Gout    Chronic, stable.  Controlled with Allopurinol.  Continue current medication regimen.  Labs today.      Relevant Orders   Microscopic Examination (Completed)   Depression, recurrent (HCC)    Chronic, stable.  Denies SI/HI.  Continue current medication regimen and adjust as needed.        Hyperlipidemia LDL goal <70    Chronic, ongoing.  Continue current medication and adjust as needed.  Labs today.       Other Visit Diagnoses    Encounter for annual physical exam    -  Primary      Discussed aspirin prophylaxis for myocardial infarction prevention and decision was made to continue ASA  LABORATORY TESTING:  Health maintenance labs ordered today as discussed above.   The natural history of prostate cancer and ongoing controversy regarding screening and potential treatment outcomes of prostate cancer has been discussed with the patient. The meaning of a false positive PSA and a false negative PSA has been discussed. He indicates understanding of the limitations of this screening test and wishes  to proceed with screening PSA testing.   IMMUNIZATIONS:   - Tdap: Tetanus vaccination status reviewed: last tetanus booster within 10 years. - Influenza: Administered today -  Pneumovax: Up to date - Prevnar: Up to date - Zostavax vaccine: Up to date  SCREENING: - Colonoscopy: Up to date  Discussed with patient purpose of the colonoscopy is to detect colon cancer at  curable precancerous or early stages   - AAA Screening: Not applicable  -Hearing Test: Not applicable  -Spirometry: Not applicable   PATIENT COUNSELING:    Sexuality: Discussed sexually transmitted diseases, partner selection, use of condoms, avoidance of unintended pregnancy  and contraceptive alternatives.   Advised to avoid cigarette smoking.  I discussed with the patient that most people either abstain from alcohol or drink within safe limits (<=14/week and <=4 drinks/occasion for males, <=7/weeks and <= 3 drinks/occasion for females) and that the risk for alcohol disorders and other health effects rises proportionally with the number of drinks per week and how often a drinker exceeds daily limits.  Discussed cessation/primary prevention of drug use and availability of treatment for abuse.   Diet: Encouraged to adjust caloric intake to maintain  or achieve ideal body weight, to reduce intake of dietary saturated fat and total fat, to limit sodium intake by avoiding high sodium foods and not adding table salt, and to maintain adequate dietary potassium and calcium preferably from fresh fruits, vegetables, and low-fat dairy products.    stressed the importance of regular exercise  Injury prevention: Discussed safety belts, safety helmets, smoke detector, smoking near bedding or upholstery.   Dental health: Discussed importance of regular tooth brushing, flossing, and dental visits.   Follow up plan: NEXT PREVENTATIVE PHYSICAL DUE IN 1 YEAR. Return in about 6 months (around 02/16/2020) for HTN, CAD, BPH, Gout, Anxiety.

## 2019-08-19 NOTE — Patient Instructions (Signed)
Medication Instructions:  Your physician recommends that you continue on your current medications as directed. Please refer to the Current Medication list given to you today.  If you need a refill on your cardiac medications before your next appointment, please call your pharmacy.   Lab work: - None ordered.  If you have labs (blood work) drawn today and your tests are completely normal, you will receive your results only by: Marland Kitchen MyChart Message (if you have MyChart) OR . A paper copy in the mail If you have any lab test that is abnormal or we need to change your treatment, we will call you to review the results.  Testing/Procedures: - None ordered.   Follow-Up: At [facility], you and your health needs are our priority.  As part of our continuing mission to provide you with exceptional heart care, we have created designated Provider Care Teams.  These Care Teams include your primary Cardiologist (physician) and Advanced Practice Providers (APPs -  Physician Assistants and Nurse Practitioners) who all work together to provide you with the care you need, when you need it. You will need a follow up appointment in 6 months VIRTUAL VISIT.  Please call our office 2 months in advance to schedule this appointment.  You may see Nelva Bush, MD or one of the following Advanced Practice Providers on your designated Care Team:   Murray Hodgkins, NP Christell Faith, PA-C . Marrianne Mood, PA-C

## 2019-08-19 NOTE — Assessment & Plan Note (Signed)
Chronic, ongoing,  Well-controlled without medication at this time.  Continue to monitor.

## 2019-08-19 NOTE — Assessment & Plan Note (Signed)
Chronic, stable with BP at goal.  Continue current medication regimen and adjust as needed.  Labs today.   

## 2019-08-20 ENCOUNTER — Encounter: Payer: Self-pay | Admitting: Family Medicine

## 2019-08-20 ENCOUNTER — Encounter: Payer: Medicare Other | Admitting: *Deleted

## 2019-08-20 ENCOUNTER — Encounter: Payer: Self-pay | Admitting: *Deleted

## 2019-08-20 DIAGNOSIS — Z955 Presence of coronary angioplasty implant and graft: Secondary | ICD-10-CM

## 2019-08-20 LAB — URIC ACID: Uric Acid: 3.1 mg/dL — ABNORMAL LOW (ref 3.7–8.6)

## 2019-08-20 LAB — LIPID PANEL
Chol/HDL Ratio: 1.8 ratio (ref 0.0–5.0)
Cholesterol, Total: 151 mg/dL (ref 100–199)
HDL: 82 mg/dL (ref >39)
LDL Chol Calc (NIH): 55 mg/dL (ref 0–99)
Triglycerides: 72 mg/dL (ref 0–149)
VLDL Cholesterol Cal: 14 mg/dL (ref 5–40)

## 2019-08-20 LAB — TSH: TSH: 1.32 u[IU]/mL (ref 0.450–4.500)

## 2019-08-20 LAB — PSA: Prostate Specific Ag, Serum: 1.8 ng/mL (ref 0.0–4.0)

## 2019-08-20 NOTE — Progress Notes (Signed)
Daily Session Note  Patient Details  Name: Jesse Norton MRN: [id number] Date of Birth: Jul 27, 1946 Referring Provider:     Cardiac Rehab from 02/13/2019 in [facility] Cardiac [facility] Pulmonary [facility]  Referring Provider  End      Encounter Date: 08/20/2019  Check In: Session Check In - 08/20/19 1357      Check-In   Supervising physician immediately available to respond to emergencies  See telemetry face sheet for immediately available ER MD    Location  [facility]-Cardiac & Pulmonary Rehab    Staff Present  Renita Papa, RN Vickki Hearing, BA, ACSM CEP, Exercise Physiologist;Joseph Tessie Fass RCP,RRT,BSRT    Virtual Visit  No    Medication changes reported      No    Fall or balance concerns reported     No    Warm-up and Cool-down  Performed on first and last piece of equipment    Resistance Training Performed  Yes    VAD Patient?  No    PAD/SET Patient?  No      Pain Assessment   Currently in Pain?  No/denies          Social History   Tobacco Use  Smoking Status Never Smoker  Smokeless Tobacco Never Used    Goals Met:  Independence with exercise equipment Exercise tolerated well No report of cardiac concerns or symptoms Strength training completed today  Goals Unmet:  Not Applicable  Comments: Pt able to follow exercise prescription today without complaint.  Will continue to monitor for progression.    Dr. Emily Filbert is Medical Director for [facility] and [facility].

## 2019-08-20 NOTE — Progress Notes (Signed)
Cardiac Individual Treatment Plan  Patient Details  Name: Jesse Norton MRN: [id number] Date of Birth: Aug 05, 1946 Referring Provider:     Cardiac Rehab from 02/13/2019 in [facility] Cardiac and Pulmonary Rehab  Referring Provider  End      Initial Encounter Date:    Cardiac Rehab from 02/13/2019 in [facility] Cardiac and Pulmonary Rehab  Date  02/13/19      Visit Diagnosis: Status post coronary artery stent placement  Patient's Home Medications on Admission:  Current Outpatient Medications:  .  allopurinol (ZYLOPRIM) 300 MG tablet, Take 1 tablet (300 mg total) by mouth daily., Disp: 90 tablet, Rfl: 4 .  amLODipine (NORVASC) 10 MG tablet, Take 1 tablet (10 mg total) by mouth daily., Disp: 90 tablet, Rfl: 4 .  aspirin EC 81 MG tablet, Take 81 mg by mouth daily., Disp: , Rfl:  .  atorvastatin (LIPITOR) 40 MG tablet, Take 1 tablet (40 mg total) by mouth daily., Disp: 90 tablet, Rfl: 4 .  clopidogrel (PLAVIX) 75 MG tablet, Take 1 tablet (75 mg total) by mouth daily with breakfast., Disp: 90 tablet, Rfl: 3 .  FLUoxetine (PROZAC) 20 MG capsule, Take 1 capsule (20 mg total) by mouth daily., Disp: 90 capsule, Rfl: 4 .  furosemide (LASIX) 20 MG tablet, Take 1 tablet (20 mg total) by mouth daily for 30 days. (Patient taking differently: Take 20 mg by mouth daily. Daily), Disp: 90 tablet, Rfl: 3 .  LORazepam (ATIVAN) 1 MG tablet, Take 0.5-1 tablets (0.5-1 mg total) by mouth daily as needed for anxiety., Disp: 30 tablet, Rfl: 1 .  losartan (COZAAR) 100 MG tablet, Take 1 tablet (100 mg total) by mouth daily., Disp: 90 tablet, Rfl: 3 .  metoprolol tartrate (LOPRESSOR) 25 MG tablet, Take 1 tablet (25 mg total) by mouth 2 (two) times daily., Disp: 180 tablet, Rfl: 3 .  Multiple Vitamin (MULTIVITAMIN) tablet, Take 1 tablet by mouth daily., Disp: , Rfl:  .  Multiple Vitamins-Minerals (EMERGEN-C IMMUNE PO), Take 1 tablet by mouth every other day. , Disp: , Rfl:  .  pantoprazole (PROTONIX) 20 MG tablet, Take 1 tablet  (20 mg total) by mouth 2 (two) times daily., Disp: 180 tablet, Rfl: 4  Past Medical History: Past Medical History:  Diagnosis Date  . (HFpEF) heart failure with preserved ejection fraction ([place])    a. 01/2019 Cath: EF 55-60% w/ elevated LVEDP.  Marland Kitchen Anxiety   . Barrett's esophagus   . CAD (coronary artery disease)    a. 10/2018 ETT: 68m horizontal inflat ST depression, freq PVC's->Intermediate; b. 12/2018 Cardiac CTA: LM nl, LAD FFRct 0.6 in mLAD, LCX FFRct 0.99p, 0.838m0.77d, RCA no signif dzs, FFRct 0.89d->Rec cath; c. 01/2019 PCI: LM nl, LAD 40p, 8546m2 70, LCX 70p (2.75x15 Resolute Onyx DES), RCA min irregs. EF 55-65%. EDP 25-35m11m d. 01/2019 PCI: LAD 80-90 (atherectomy & 3x15 Resolute Onyx DES).  . Colon polyps   . Duodenal adenoma   . Gastritis   . GERD (gastroesophageal reflux disease)   . Gout   . H. pylori infection   . H/O urethral stricture   . Hemorrhoids   . Hiatal hernia   . Hypertension   . Stomach ulcer     Tobacco Use: Social History   Tobacco Use  Smoking Status Never Smoker  Smokeless Tobacco Never Used    Labs: Recent Review Flowsheet Data    Labs for ITP Cardiac and Pulmonary Rehab Latest Ref Rng & Units 05/25/2015 05/30/2016 06/07/2017 06/26/2018 02/12/2019  Cholestrol <200 mg/dL 186 181 166 169 124   LDLCALC 0 - 99 mg/dL 109(H) 107(H) 85 98 -   HDL >39 mg/dL 62 62 56 59 -   Trlycerides <150 mg/dL 75 61 127 62 58       Exercise Target Goals: Exercise Program Goal: Individual exercise prescription set using results from initial 6 min walk test and THRR while considering  patient's activity barriers and safety.   Exercise Prescription Goal: Initial exercise prescription builds to 30-45 minutes a day of aerobic activity, 2-3 days per week.  Home exercise guidelines will be given to patient during program as part of exercise prescription that the participant will acknowledge.  Activity Barriers & Risk Stratification:   6 Minute Walk:   Oxygen Initial  Assessment:   Oxygen Re-Evaluation:   Oxygen Discharge (Final Oxygen Re-Evaluation):   Initial Exercise Prescription:   Perform Capillary Blood Glucose checks as needed.  Exercise Prescription Changes: Exercise Prescription Changes    Row Name 07/14/19 1000 08/01/19 1100 08/12/19 1500         Response to Exercise   Blood Pressure (Admit)  136/80  130/78  118/60     Blood Pressure (Exercise)  144/60  160/80  -     Blood Pressure (Exit)  138/78  122/78  138/78     Heart Rate (Admit)  63 bpm  72 bpm  70 bpm     Heart Rate (Exercise)  122 bpm  115 bpm  115 bpm     Heart Rate (Exit)  92 bpm  75 bpm  84 bpm     Rating of Perceived Exertion (Exercise)  '13  13  12     '$ Symptoms  none  none  none     Duration  Continue with 30 min of aerobic exercise without signs/symptoms of physical distress.  Continue with 30 min of aerobic exercise without signs/symptoms of physical distress.  Continue with 30 min of aerobic exercise without signs/symptoms of physical distress.     Intensity  THRR unchanged  THRR unchanged  THRR unchanged       Progression   Progression  Continue to progress workloads to maintain intensity without signs/symptoms of physical distress.  Continue to progress workloads to maintain intensity without signs/symptoms of physical distress.  Continue to progress workloads to maintain intensity without signs/symptoms of physical distress.     Average METs  3.04  3.7  4.3       Resistance Training   Training Prescription  Yes  Yes  Yes     Weight  4 lbs  10 lb  10 lb     Reps  10-15  10-15  10-15       Interval Training   Interval Training  No  No  No       Treadmill   MPH  2.3  2.6  3.2     Grade  0.'5  2  2     '$ Minutes  '15  15  15     '$ METs  2.92  3.71  4.33       NuStep   Level  3  4  -     Minutes  15  15  -     METs  4.3  2.7  -       Recumbant Elliptical   Level  1  3  -     Minutes  15  15  -     METs  1.9  2.4  -       Elliptical   Level  '1  2  3      '$ Speed  4.1  -  -     Minutes  '15  15  15       '$ Home Exercise Plan   Plans to continue exercise at  Home (comment) walking, biking  Home (comment) walking, biking  Home (comment) walking, biking     Frequency  Add 2 additional days to program exercise sessions.  Add 2 additional days to program exercise sessions.  Add 2 additional days to program exercise sessions.     Initial Home Exercises Provided  03/21/19  03/21/19  03/21/19        Exercise Comments:   Exercise Goals and Review:   Exercise Goals Re-Evaluation : Exercise Goals Re-Evaluation    Row Name 03/21/19 1133 07/02/19 1414 07/14/19 0959 07/17/19 1534 07/30/19 1418     Exercise Goal Re-Evaluation   Exercise Goals Review  Increase Physical Activity;Increase Strength and Stamina  Increase Physical Activity;Able to understand and use rate of perceived exertion (RPE) scale;Knowledge and understanding of Target Heart Rate Range (THRR);Understanding of Exercise Prescription;Increase Strength and Stamina;Able to check pulse independently  Increase Physical Activity;Increase Strength and Stamina;Understanding of Exercise Prescription  Increase Physical Activity  Increase Physical Activity;Increase Strength and Stamina;Able to understand and use rate of perceived exertion (RPE) scale;Knowledge and understanding of Target Heart Rate Range (THRR);Able to check pulse independently;Understanding of Exercise Prescription   Comments  Enrico has been walking 6 days a week 1-2 miles, and riding his bike inside 3 days.  He also does light weight work.   Staff will forward the videos so he has some other strength exercise to follow.  He did have to cut up a tree that fell and could tell he was tired - his son helped.  He is looking forward to starting HT.  Reviewed RPE scale, THR and program prescription with pt today.  Pt voiced understanding and was given a copy of goals to take home.  Jalene is off to a good start. He has even started on the  elliptical and up to 3 METs on the NuStep. We will continue to monitor his progress.  Brelan talked about his home exercise routine today.  walking alternate days, biking 10 miles or rollers, cardio. USes stationary bike .  6 days a week routine  Jenkins continues his home exercise.  He is checking HR etc and maintains consistent RPM on bike.   Expected Outcomes  Short - continue exercise at home Long - improve MET level  Short: Use RPE daily to regulate intensity. Long: Follow program prescription in THR.  Short: Continue to increase workloads.  Long: Continue to increase strength and stamina.  Short: Continue to increase workloads.  Long: Continue to increase strength and stamina.  -   Row Name 08/01/19 1115 08/12/19 1551           Exercise Goal Re-Evaluation   Exercise Goals Review  Increase Physical Activity;Increase Strength and Stamina;Able to understand and use rate of perceived exertion (RPE) scale;Knowledge and understanding of Target Heart Rate Range (THRR);Able to check pulse independently;Understanding of Exercise Prescription  Increase Physical Activity;Increase Strength and Stamina;Able to understand and use rate of perceived exertion (RPE) scale;Able to understand and use Dyspnea scale;Knowledge and understanding of Target Heart Rate Range (THRR);Able to check pulse independently;Understanding of Exercise Prescription      Comments  Koleson is progressing well in HT.  He is up to 10 lb weights and has increased levels on all machines  Strider is up to 4.3 METS on TM.  Staff will monitor progress      Expected Outcomes  Short - continue to attend consistently Long - increase overall MET level  Short - consider interval training Long - maintain increase in MET level         Discharge Exercise Prescription (Final Exercise Prescription Changes): Exercise Prescription Changes - 08/12/19 1500      Response to Exercise   Blood Pressure (Admit)  118/60    Blood Pressure (Exit)  138/78     Heart Rate (Admit)  70 bpm    Heart Rate (Exercise)  115 bpm    Heart Rate (Exit)  84 bpm    Rating of Perceived Exertion (Exercise)  12    Symptoms  none    Duration  Continue with 30 min of aerobic exercise without signs/symptoms of physical distress.    Intensity  THRR unchanged      Progression   Progression  Continue to progress workloads to maintain intensity without signs/symptoms of physical distress.    Average METs  4.3      Resistance Training   Training Prescription  Yes    Weight  10 lb    Reps  10-15      Interval Training   Interval Training  No      Treadmill   MPH  3.2    Grade  2    Minutes  15    METs  4.33      Elliptical   Level  3    Minutes  15      Home Exercise Plan   Plans to continue exercise at  Home (comment)   walking, biking   Frequency  Add 2 additional days to program exercise sessions.    Initial Home Exercises Provided  03/21/19       Nutrition:  Target Goals: Understanding of nutrition guidelines, daily intake of sodium '1500mg'$ , cholesterol '200mg'$ , calories 30% from fat and 7% or less from saturated fats, daily to have 5 or more servings of fruits and vegetables.  Biometrics:    Nutrition Therapy Plan and Nutrition Goals: Nutrition Therapy & Goals - 07/07/19 1510      Nutrition Therapy   Diet  Low Na HH diet    Drug/Food Interactions  Statins/Certain Fruits   Lipitor   Protein (specify units)  95g    Fiber  30 grams    Whole Grain Foods  3 servings    Saturated Fats  12 max. grams    Fruits and Vegetables  5 servings/day    Sodium  1.5 grams      Personal Nutrition Goals   Nutrition Goal  ST: navigate HH eating LT: get back into shape    Comments  Pt is on Lasixs (checks his legs every morning). Pt reports having good days and bad days. Pt wants help with his trip food list (pt provided - will review and disucss). Pt reports trying to choose lean meats and low sodium options as well as vegetables. Pt asked about asian  cuisine and sodium intake; discussed that he could choose low sodium options and make some things himself, but in terms of sauce the biggest impact would be managing quantity. Discussed HH diet and low Na. Pt food list: B: eggs, bacon, coffee, juice. Oatmeal, prunes, brown sugar, coffee. cold cereal, fruit cup, powdered milk, coffee (granola,  cheerios). pancakes, blueberry syrup, coffee. L: canned salmon or canned chicken and cheese wrap, chips, candy bar. Instant soup, nabs (peanut butter or cheese crackers), summer sausage, cookies. Cold cuts, apple, pickles, rolls. Canned tuna or smoked oysters, nabs, cookies, mayo and pickles. D: country ham, pinto beans, cornbread, pudding. steak, instant potatoes, canned peas, cookies. spam/canned chicken, rice, string beans, blueberry or apple muffins, pineapple chunks. canned lasanga/ spaghetti, cheese, pregos sauce, muffins, pudding.[place]runswick stew, muffins.      Intervention Plan   Intervention  Prescribe, educate and counsel regarding individualized specific dietary modifications aiming towards targeted core components such as weight, hypertension, lipid management, diabetes, heart failure and other comorbidities.;Nutrition handout(s) given to patient.    Expected Outcomes  Short Term Goal: Understand basic principles of dietary content, such as calories, fat, sodium, cholesterol and nutrients.;Short Term Goal: A plan has been developed with personal nutrition goals set during dietitian appointment.;Long Term Goal: Adherence to prescribed nutrition plan.       Nutrition Assessments:   Nutrition Goals Re-Evaluation: Nutrition Goals Re-Evaluation    Marissa Name 07/24/19 1024             Goals   Nutrition Goal  ST: navigate HH eating LT: get back into shape       Comment  Gave pt canoe trip meal tips and 2 sample meals for B, L, and D. Disscussed what he could do to make his trips healthier, pt asked about his spam, told him since he only has it once per  year on these trips, it's not very concerning, but there really aren't equal replacements for that. Disscussed why spam may not be a desirable choice (Na content, processed meat)       Expected Outcome  ST: navigate HH eating LT: get back into shape          Nutrition Goals Discharge (Final Nutrition Goals Re-Evaluation): Nutrition Goals Re-Evaluation - 07/24/19 1024      Goals   Nutrition Goal  ST: navigate HH eating LT: get back into shape    Comment  Gave pt canoe trip meal tips and 2 sample meals for B, L, and D. Disscussed what he could do to make his trips healthier, pt asked about his spam, told him since he only has it once per year on these trips, it's not very concerning, but there really aren't equal replacements for that. Disscussed why spam may not be a desirable choice (Na content, processed meat)    Expected Outcome  ST: navigate HH eating LT: get back into shape       Psychosocial: Target Goals: Acknowledge presence or absence of significant depression and/or stress, maximize coping skills, provide positive support system. Participant is able to verbalize types and ability to use techniques and skills needed for reducing stress and depression.   Initial Review & Psychosocial Screening:   Quality of Life Scores:   Scores of 19 and below usually indicate a poorer quality of life in these areas.  A difference of  2-3 points is a clinically meaningful difference.  A difference of 2-3 points in the total score of the Quality of Life Index has been associated with significant improvement in overall quality of life, self-image, physical symptoms, and general health in studies assessing change in quality of life.  PHQ-9: Recent Review Flowsheet Data    Depression screen [facility] 2/9 08/19/2019 06/04/2019 02/13/2019 02/12/2019 05/23/2018   Decreased Interest 0 0 0 0 0   Down, Depressed, Hopeless 0 1 0  0 1   PHQ - 2 Score 0 1 0 0 1   Altered sleeping 0 - '1 1 1   '$ Tired, decreased energy 0 - 0  0 1   Change in appetite 0 - 0 0 0   Feeling bad or failure about yourself  0 - 0 0 0   Trouble concentrating 0 - 0 0 1   Moving slowly or fidgety/restless 0 - 0 0 1   Suicidal thoughts 0 - 0 0 0   PHQ-9 Score 0 - '1 1 5   '$ Difficult doing work/chores Not difficult at all - Not difficult at all Not difficult at all Not difficult at all     Interpretation of Total Score  Total Score Depression Severity:  1-4 = Minimal depression, 5-9 = Mild depression, 10-14 = Moderate depression, 15-19 = Moderately severe depression, 20-27 = Severe depression   Psychosocial Evaluation and Intervention:   Psychosocial Re-Evaluation: Psychosocial Re-Evaluation    Row Name 07/10/19 1429 07/30/19 1421           Psychosocial Re-Evaluation   Current issues with  Current Psychotropic Meds;Current Anxiety/Panic;Current Stress Concerns  Current Stress Concerns      Comments  His son has been making some life decisions that has been attributing to his stress. Informed patient that it is not his fault and sometimes those issues are out of his control. He is taking porozac and has been helping with his anxiety. Spoke to patient about continuing exercise to help reduce stress. He has a great attitude and wants to me able to do more after rehab.  Muaad has been meditatin at night before bed using youtube realxing music and self guided relaxation.  He often goes to sleep during this time and states "it really works to reduce stress."      Expected Outcomes  Short: continue medications and exercise. Long: reduce stressors to help his overall well being.  Short - continue exercise and meditation Long - manage stress independently      Interventions  Encouraged to attend Cardiac Rehabilitation for the exercise  -         Psychosocial Discharge (Final Psychosocial Re-Evaluation): Psychosocial Re-Evaluation - 07/30/19 1421      Psychosocial Re-Evaluation   Current issues with  Current Stress Concerns    Comments   Grainger has been meditatin at night before bed using youtube realxing music and self guided relaxation.  He often goes to sleep during this time and states "it really works to reduce stress."    Expected Outcomes  Short - continue exercise and meditation Long - manage stress independently       Vocational Rehabilitation: Provide vocational rehab assistance to qualifying candidates.   Vocational Rehab Evaluation & Intervention:   Education: Education Goals: Education classes will be provided on a variety of topics geared toward better understanding of heart health and risk factor modification. Participant will state understanding/return demonstration of topics presented as noted by education test scores.  Learning Barriers/Preferences:   Education Topics:  AED/CPR: - Group verbal and written instruction with the use of models to demonstrate the basic use of the AED with the basic ABC's of resuscitation.   General Nutrition Guidelines/Fats and Fiber: -Group instruction provided by verbal, written material, models and posters to present the general guidelines for heart healthy nutrition. Gives an explanation and review of dietary fats and fiber.   Controlling Sodium/Reading Food Labels: -Group verbal and written material supporting the discussion of sodium use  in heart healthy nutrition. Review and explanation with models, verbal and written materials for utilization of the food label.   Exercise Physiology & General Exercise Guidelines: - Group verbal and written instruction with models to review the exercise physiology of the cardiovascular system and associated critical values. Provides general exercise guidelines with specific guidelines to those with heart or lung disease.    Aerobic Exercise & Resistance Training: - Gives group verbal and written instruction on the various components of exercise. Focuses on aerobic and resistive training programs and the benefits of this  training and how to safely progress through these programs..   Flexibility, Balance, Mind/Body Relaxation: Provides group verbal/written instruction on the benefits of flexibility and balance training, including mind/body exercise modes such as yoga, pilates and tai chi.  Demonstration and skill practice provided.   Stress and Anxiety: - Provides group verbal and written instruction about the health risks of elevated stress and causes of high stress.  Discuss the correlation between heart/lung disease and anxiety and treatment options. Review healthy ways to manage with stress and anxiety.   Depression: - Provides group verbal and written instruction on the correlation between heart/lung disease and depressed mood, treatment options, and the stigmas associated with seeking treatment.   Anatomy & Physiology of the Heart: - Group verbal and written instruction and models provide basic cardiac anatomy and physiology, with the coronary electrical and arterial systems. Review of Valvular disease and Heart Failure   Cardiac Procedures: - Group verbal and written instruction to review commonly prescribed medications for heart disease. Reviews the medication, class of the drug, and side effects. Includes the steps to properly store meds and maintain the prescription regimen. (beta blockers and nitrates)   Cardiac Medications I: - Group verbal and written instruction to review commonly prescribed medications for heart disease. Reviews the medication, class of the drug, and side effects. Includes the steps to properly store meds and maintain the prescription regimen.   Cardiac Medications II: -Group verbal and written instruction to review commonly prescribed medications for heart disease. Reviews the medication, class of the drug, and side effects. (all other drug classes)    Go Sex-Intimacy & Heart Disease, Get SMART - Goal Setting: - Group verbal and written instruction through game format  to discuss heart disease and the return to sexual intimacy. Provides group verbal and written material to discuss and apply goal setting through the application of the S.M.A.R.T. Method.   Other Matters of the Heart: - Provides group verbal, written materials and models to describe Stable Angina and Peripheral Artery. Includes description of the disease process and treatment options available to the cardiac patient.   Exercise & Equipment Safety: - Individual verbal instruction and demonstration of equipment use and safety with use of the equipment.   Cardiac Rehab from 02/13/2019 in [facility] Cardiac and Pulmonary Rehab  Date  02/13/19  Educator  [facility]  Instruction Review Code  1- Verbalizes Understanding      Infection Prevention: - Provides verbal and written material to individual with discussion of infection control including proper hand washing and proper equipment cleaning during exercise session.   Cardiac Rehab from 02/13/2019 in [facility] Cardiac and Pulmonary Rehab  Date  02/13/19  Educator  [facility]  Instruction Review Code  1- Verbalizes Understanding      Falls Prevention: - Provides verbal and written material to individual with discussion of falls prevention and safety.   Cardiac Rehab from 02/13/2019 in [facility] Cardiac and Pulmonary Rehab  Date  02/13/19  Educator  [place]  Instruction Review Code  1- Verbalizes Understanding      Diabetes: - Individual verbal and written instruction to review signs/symptoms of diabetes, desired ranges of glucose level fasting, after meals and with exercise. Acknowledge that pre and post exercise glucose checks will be done for 3 sessions at entry of program.   Know Your Numbers and Risk Factors: -Group verbal and written instruction about important numbers in your health.  Discussion of what are risk factors and how they play a role in the disease process.  Review of Cholesterol, Blood Pressure, Diabetes, and BMI and the role they play in your overall  health.   Sleep Hygiene: -Provides group verbal and written instruction about how sleep can affect your health.  Define sleep hygiene, discuss sleep cycles and impact of sleep habits. Review good sleep hygiene tips.    Other: -Provides group and verbal instruction on various topics (see comments)   Knowledge Questionnaire Score:   Core Components/Risk Factors/Patient Goals at Admission:   Core Components/Risk Factors/Patient Goals Review:  Goals and Risk Factor Review    Row Name 07/10/19 1441 07/30/19 1416           Core Components/Risk Factors/Patient Goals Review   Personal Goals Review  Weight Management/Obesity;Lipids;Hypertension  -      Review  Patient states he is taking his medications at home and checking his blood pressure. He was checking his blood pressure after lunch and was not resting enough for a true resting blood pressure. Informed patient to take his blood pressure everday at the same time for a week to get a true resting blood pressure.  Jesus is taking meds and BP at home.  he has no other current concerns.      Expected Outcomes  Short: check blood pressure daily. Long: maintain a healthy blood pressure.  Short - continue checking BP Long - maintain healthy BP         Core Components/Risk Factors/Patient Goals at Discharge (Final Review):  Goals and Risk Factor Review - 07/30/19 1416      Core Components/Risk Factors/Patient Goals Review   Review  Darrek is taking meds and BP at home.  he has no other current concerns.    Expected Outcomes  Short - continue checking BP Long - maintain healthy BP       ITP Comments: ITP Comments    Row Name 02/28/19 1007 03/05/19 1337 04/15/19 1047 05/01/19 1604 06/25/19 1334   ITP Comments  Our program is currently closed due to COVID-19.  We are communicating with patient via phone calls and emails.   30 day review completed. Continue with ITP unless directed changes by Medical Director at review  Tyrese is still  exercising at home - up to walking 3 miles at 3 mph.  He is still riding his bike alternate days.  He is keeping a log of his food.  I will email Melissa about nutrition consult.   Morio is riding his bike at [employer] and walking.  He is monitoring his BP at home at rest and during exercise.  I will send Lenna Sciara his email as they havent been able to touch base via phone.  30 day review cycle restarting  after being closed since March 16 because of  Covid 19 pandemic. Program opened to patients on July 6. Not all have returned. ITP updated and sent to Medical Director for review,changes as needed and signature   [place] Name 07/02/19 1415  07/23/19 0602 08/20/19 0612       ITP Comments  First full day of exercise!  Patient was oriented to gym and equipment including functions, settings, policies, and procedures.  Patient's individual exercise prescription and treatment plan were reviewed.  All starting workloads were established based on the results of the 6 minute walk test done at initial orientation visit.  The plan for exercise progression was also introduced and progression will be customized based on patient's performance and goals.  30 Day Review Completed today. Continue with ITP unless changed by Medical Director review.  New to program  30 Day review. Continue with ITP unless directed changes per Medical Director review.        Comments:

## 2019-08-21 ENCOUNTER — Encounter: Payer: Self-pay | Admitting: Urology

## 2019-08-21 ENCOUNTER — Encounter: Payer: Medicare Other | Admitting: *Deleted

## 2019-08-21 ENCOUNTER — Other Ambulatory Visit: Payer: Self-pay

## 2019-08-21 ENCOUNTER — Ambulatory Visit: Payer: Medicare Other | Admitting: Urology

## 2019-08-21 DIAGNOSIS — Z955 Presence of coronary angioplasty implant and graft: Secondary | ICD-10-CM

## 2019-08-21 NOTE — Progress Notes (Signed)
Daily Session Note  Patient Details  Name: TREVYN LUMPKIN MRN: [id number] Date of Birth: 29-Nov-1946 Referring Provider:     Cardiac Rehab from 02/13/2019 in [facility] Cardiac and Pulmonary Rehab  Referring Provider  End      Encounter Date: 08/21/2019  Check In: Session Check In - 08/21/19 1008      Check-In   Supervising physician immediately available to respond to emergencies  See telemetry face sheet for immediately available ER MD    Location  [facility]-Cardiac & Pulmonary Rehab    Staff Present  Heath Lark, RN, BSN, CCRP;Jeanna Durrell BS, Exercise Physiologist;Amanda Oletta Darter, BA, ACSM CEP, Exercise Physiologist    Virtual Visit  No    Medication changes reported      No    Fall or balance concerns reported     No    Warm-up and Cool-down  Performed on first and last piece of equipment    Resistance Training Performed  Yes    VAD Patient?  No    PAD/SET Patient?  No      Pain Assessment   Currently in Pain?  No/denies          Social History   Tobacco Use  Smoking Status Never Smoker  Smokeless Tobacco Never Used    Goals Met:  Independence with exercise equipment Exercise tolerated well No report of cardiac concerns or symptoms Strength training completed today  Goals Unmet:  Not Applicable  Comments: Pt able to follow exercise prescription today without complaint.  Will continue to monitor for progression.    Dr. Emily Filbert is Medical Director for [place] and [facility].

## 2019-08-25 ENCOUNTER — Encounter: Payer: Self-pay | Admitting: Urology

## 2019-08-25 ENCOUNTER — Other Ambulatory Visit: Payer: Self-pay

## 2019-08-25 ENCOUNTER — Ambulatory Visit (INDEPENDENT_AMBULATORY_CARE_PROVIDER_SITE_OTHER): Payer: Medicare Other | Admitting: Urology

## 2019-08-25 VITALS — BP 156/82 | HR 76 | Ht 71.0 in | Wt 256.0 lb

## 2019-08-25 DIAGNOSIS — N35919 Unspecified urethral stricture, male, unspecified site: Secondary | ICD-10-CM | POA: Diagnosis not present

## 2019-08-25 LAB — BLADDER SCAN AMB NON-IMAGING

## 2019-08-25 NOTE — Progress Notes (Signed)
   08/25/2019 12:44 PM   Jesse Norton [birth year] [id number]  Reason for visit: Follow up urethral stricture status post dilation  HPI: I saw Jesse Norton back in urology clinic for follow-up today.  He is a 73 year old male that underwent balloon dilation of a short 6 French bulbourethral stricture on 08/16/2018.  This was originally discovered during a microscopic hematuria work-up.  He reports he is voiding with a much stronger stream since before surgery and denies any complaints today.  He denies any hematuria or difficulty voiding.  PVR in clinic today 0 mL.   ROS: Please see flowsheet from today's date for complete review of systems.  Physical Exam: BP (!) 156/82   Pulse 76   Ht 5\' 11"  (1.803 m)   Wt 256 lb (116.1 kg)   BMI 35.70 kg/m    Constitutional:  Alert and oriented, No acute distress. Respiratory: Normal respiratory effort, no increased work of breathing. GI: Abdomen is soft, nontender, nondistended, no abdominal masses Skin: No rashes, bruises or suspicious lesions. Neurologic: Grossly intact, no focal deficits, moving all 4 extremities. Psychiatric: Normal mood and affect  Assessment & Plan:   In summary, he is a 73 year old male who underwent balloon dilation in the operating room for a 6 French short bulbourethral stricture on 08/16/2018.  He had significant improvement in his stream since the procedure and denies any new complaints.  He is emptying well with a PVR of 0 mL at this time.  We discussed return precautions including weakening stream, split stream, UTIs, or retention.  RTC 1 year for PVR, consider PRN follow-up if doing well at that visit  A total of 15 minutes were spent face-to-face with the patient, greater than 50% was spent in patient education, counseling, and coordination of care regarding urethral stricture and risk of recurrence.   Return in about 1 year (around 08/24/2020) for 1 year w/PVR.  [name], [facility] [address] [place], [place] [address] [phone]

## 2019-08-26 ENCOUNTER — Encounter: Payer: Medicare Other | Admitting: *Deleted

## 2019-08-26 DIAGNOSIS — Z955 Presence of coronary angioplasty implant and graft: Secondary | ICD-10-CM

## 2019-08-26 NOTE — Progress Notes (Signed)
Daily Session Note  Patient Details  Name: Jesse Norton MRN: [id number] Date of Birth: [birth year] Referring Provider:     Cardiac Rehab from 02/13/2019 in [facility] Cardiac and Pulmonary Rehab  Referring Provider  End      Encounter Date: 08/26/2019  Check In: Session Check In - 08/26/19 1008      Check-In   Supervising physician immediately available to respond to emergencies  See telemetry face sheet for immediately available ER MD    Location  [facility]-Cardiac & Pulmonary Rehab    Staff Present  Heath Lark, RN, BSN, CCRP;Melissa [place] RDN, LDN;Joseph [employer], [place], ACSM CEP, Exercise Physiologist    Virtual Visit  No    Medication changes reported      No    Fall or balance concerns reported     No    Warm-up and Cool-down  Performed on first and last piece of equipment    Resistance Training Performed  Yes    VAD Patient?  No    PAD/SET Patient?  No      Pain Assessment   Currently in Pain?  No/denies          Social History   Tobacco Use  Smoking Status Never Smoker  Smokeless Tobacco Never Used    Goals Met:  Independence with exercise equipment Exercise tolerated well No report of cardiac concerns or symptoms  Goals Unmet:  Not Applicable  Comments: Pt able to follow exercise prescription today without complaint.  Will continue to monitor for progression.    Dr. Emily Filbert is Medical Director for [place] and [facility].

## 2019-08-27 ENCOUNTER — Encounter: Payer: Medicare Other | Admitting: *Deleted

## 2019-08-27 ENCOUNTER — Other Ambulatory Visit: Payer: Self-pay

## 2019-08-27 DIAGNOSIS — Z955 Presence of coronary angioplasty implant and graft: Secondary | ICD-10-CM

## 2019-08-27 NOTE — Progress Notes (Signed)
Daily Session Note  Patient Details  Name: Jesse Norton MRN: [id number] Date of Birth: 1946-11-17 Referring Provider:     Cardiac Rehab from 02/13/2019 in [facility] Cardiac and Pulmonary Rehab  Referring Provider  End      Encounter Date: 08/27/2019  Check In: Session Check In - 08/27/19 1413      Check-In   Supervising physician immediately available to respond to emergencies  See telemetry face sheet for immediately available ER MD    Location  [facility]-Cardiac & Pulmonary Rehab    Staff Present  Renita Papa, RN BSN;Joseph Foy [place], [place], ACSM CEP, Exercise Physiologist    Virtual Visit  No    Medication changes reported      No    Fall or balance concerns reported     No    Warm-up and Cool-down  Performed on first and last piece of equipment    Resistance Training Performed  Yes    VAD Patient?  No    PAD/SET Patient?  No      Pain Assessment   Currently in Pain?  No/denies          Social History   Tobacco Use  Smoking Status Never Smoker  Smokeless Tobacco Never Used    Goals Met:  Independence with exercise equipment Exercise tolerated well No report of cardiac concerns or symptoms Strength training completed today  Goals Unmet:  Not Applicable  Comments: Pt able to follow exercise prescription today without complaint.  Will continue to monitor for progression.    Dr. Emily Filbert is Medical Director for [place] and [facility].

## 2019-08-28 DIAGNOSIS — Z955 Presence of coronary angioplasty implant and graft: Secondary | ICD-10-CM | POA: Diagnosis not present

## 2019-08-28 NOTE — Progress Notes (Signed)
Daily Session Note  Patient Details  Name: Jesse Norton MRN: [id number] Date of Birth: Feb 21, 1946 Referring Provider:     Cardiac Rehab from 02/13/2019 in [facility] Cardiac and Pulmonary Rehab  Referring Provider  End      Encounter Date: 08/28/2019  Check In: Session Check In - 08/28/19 1014      Check-In   Supervising physician immediately available to respond to emergencies  See telemetry face sheet for immediately available ER MD    Location  [facility]-Cardiac & Pulmonary Rehab    Staff Present  Vida Rigger RN, Vickki Hearing, BA, ACSM CEP, Exercise Physiologist;Jeanna Durrell BS, Exercise Physiologist    Virtual Visit  No    Medication changes reported      No    Fall or balance concerns reported     No    Warm-up and Cool-down  Performed on first and last piece of equipment    Resistance Training Performed  Yes    VAD Patient?  No    PAD/SET Patient?  No      Pain Assessment   Currently in Pain?  No/denies    Multiple Pain Sites  No          Social History   Tobacco Use  Smoking Status Never Smoker  Smokeless Tobacco Never Used    Goals Met:  Independence with exercise equipment Exercise tolerated well No report of cardiac concerns or symptoms Strength training completed today  Goals Unmet:  Not Applicable  Comments: Pt able to follow exercise prescription today without complaint.  Will continue to monitor for progression.    Dr. Emily Filbert is Medical Director for [place] and [facility].

## 2019-09-02 ENCOUNTER — Other Ambulatory Visit: Payer: Self-pay

## 2019-09-02 ENCOUNTER — Encounter: Payer: Medicare Other | Admitting: *Deleted

## 2019-09-02 DIAGNOSIS — Z955 Presence of coronary angioplasty implant and graft: Secondary | ICD-10-CM

## 2019-09-02 NOTE — Progress Notes (Signed)
Daily Session Note  Patient Details  Name: Jesse Norton MRN: [id number] Date of Birth: [birth year] Referring Provider:     Cardiac Rehab from 02/13/2019 in [facility] Cardiac and Pulmonary Rehab  Referring Provider  End      Encounter Date: 09/02/2019  Check In: Session Check In - 09/02/19 1014      Check-In   Supervising physician immediately available to respond to emergencies  See telemetry face sheet for immediately available ER MD    Location  [facility]-Cardiac & Pulmonary Rehab    Staff Present  Heath Lark, RN, BSN, CCRP;Amanda Sommer, BA, ACSM CEP, Exercise Physiologist;Joseph [place] RCP,RRT,BSRT    Virtual Visit  No    Medication changes reported      No    Fall or balance concerns reported     No    Warm-up and Cool-down  Performed on first and last piece of equipment    Resistance Training Performed  Yes    VAD Patient?  No    PAD/SET Patient?  No      Pain Assessment   Currently in Pain?  No/denies          Social History   Tobacco Use  Smoking Status Never Smoker  Smokeless Tobacco Never Used    Goals Met:  Independence with exercise equipment Exercise tolerated well No report of cardiac concerns or symptoms  Goals Unmet:  Not Applicable  Comments: Pt able to follow exercise prescription today without complaint.  Will continue to monitor for progression.    Dr. Emily Filbert is Medical Director for [place] and [facility].

## 2019-09-03 ENCOUNTER — Encounter: Payer: Medicare Other | Admitting: *Deleted

## 2019-09-03 DIAGNOSIS — Z955 Presence of coronary angioplasty implant and graft: Secondary | ICD-10-CM

## 2019-09-03 NOTE — Progress Notes (Signed)
Daily Session Note  Patient Details  Name: Jesse Norton MRN: [id number] Date of Birth: [birth year] Referring Provider:     Cardiac Rehab from 02/13/2019 in [facility] Cardiac and Pulmonary Rehab  Referring Provider  End      Encounter Date: 09/03/2019  Check In: Session Check In - 09/03/19 1400      Check-In   Supervising physician immediately available to respond to emergencies  See telemetry face sheet for immediately available ER MD    Location  [facility]-Cardiac & Pulmonary Rehab    Staff Present  Renita Papa, RN BSN;Joseph Foy [place], [place], ACSM CEP, Exercise Physiologist    Virtual Visit  No    Medication changes reported      No    Fall or balance concerns reported     No    Warm-up and Cool-down  Performed on first and last piece of equipment    Resistance Training Performed  Yes    VAD Patient?  No    PAD/SET Patient?  No      Pain Assessment   Currently in Pain?  No/denies          Social History   Tobacco Use  Smoking Status Never Smoker  Smokeless Tobacco Never Used    Goals Met:  Independence with exercise equipment Exercise tolerated well No report of cardiac concerns or symptoms Strength training completed today  Goals Unmet:  Not Applicable  Comments: Pt able to follow exercise prescription today without complaint.  Will continue to monitor for progression.    Dr. Emily Filbert is Medical Director for [place] and [facility].

## 2019-09-04 ENCOUNTER — Other Ambulatory Visit: Payer: Self-pay

## 2019-09-04 DIAGNOSIS — Z955 Presence of coronary angioplasty implant and graft: Secondary | ICD-10-CM

## 2019-09-04 NOTE — Progress Notes (Signed)
Daily Session Note  Patient Details  Name: Jesse Norton MRN: [id number] Date of Birth: 1946/04/11 Referring Provider:     Cardiac Rehab from 02/13/2019 in [facility] Cardiac and Pulmonary Rehab  Referring Provider  End      Encounter Date: 09/04/2019  Check In:      Social History   Tobacco Use  Smoking Status Never Smoker  Smokeless Tobacco Never Used    Goals Met:  Independence with exercise equipment Exercise tolerated well No report of cardiac concerns or symptoms Strength training completed today  Goals Unmet:  Not Applicable  Comments: Pt able to follow exercise prescription today without complaint.  Will continue to monitor for progression.    Dr. Emily Filbert is Medical Director for [place] and [facility].

## 2019-09-09 ENCOUNTER — Other Ambulatory Visit: Payer: Self-pay

## 2019-09-09 ENCOUNTER — Encounter: Payer: Medicare Other | Admitting: *Deleted

## 2019-09-09 ENCOUNTER — Telehealth: Payer: Self-pay

## 2019-09-09 DIAGNOSIS — Z955 Presence of coronary angioplasty implant and graft: Secondary | ICD-10-CM

## 2019-09-09 NOTE — Progress Notes (Signed)
Daily Session Note  Patient Details  Name: Jesse Norton MRN: [id number] Date of Birth: [birth year] Referring Provider:     Cardiac Rehab from 02/13/2019 in [facility] Cardiac and Pulmonary Rehab  Referring Provider  End      Encounter Date: 09/09/2019  Check In: Session Check In - 09/09/19 1023      Check-In   Supervising physician immediately available to respond to emergencies  See telemetry face sheet for immediately available ER MD    Location  [facility]-Cardiac & Pulmonary Rehab    Staff Present  Darel Hong, RN Vickki Hearing, BA, ACSM CEP, Exercise Physiologist;Joseph Gar Ponto RN,BSN    Virtual Visit  No    Medication changes reported      No    Fall or balance concerns reported     No    Warm-up and Cool-down  Performed on first and last piece of equipment    Resistance Training Performed  Yes    VAD Patient?  No    PAD/SET Patient?  No      Pain Assessment   Currently in Pain?  No/denies          Social History   Tobacco Use  Smoking Status Never Smoker  Smokeless Tobacco Never Used    Goals Met:  Independence with exercise equipment Exercise tolerated well No report of cardiac concerns or symptoms Strength training completed today  Goals Unmet:  Not Applicable  Comments: Pt able to follow exercise prescription today without complaint.  Will continue to monitor for progression.    Dr. Emily Filbert is Medical Director for [place] and [facility].

## 2019-09-10 ENCOUNTER — Encounter: Payer: Medicare Other | Admitting: *Deleted

## 2019-09-10 ENCOUNTER — Other Ambulatory Visit: Payer: Self-pay

## 2019-09-10 DIAGNOSIS — Z955 Presence of coronary angioplasty implant and graft: Secondary | ICD-10-CM

## 2019-09-10 NOTE — Progress Notes (Signed)
Daily Session Note  Patient Details  Name: Jesse Norton MRN: [id number] Date of Birth: 1946/07/12 Referring Provider:     Cardiac Rehab from 02/13/2019 in [facility] Cardiac and Pulmonary Rehab  Referring Provider  End      Encounter Date: 09/10/2019  Check In: Session Check In - 09/10/19 1358      Check-In   Supervising physician immediately available to respond to emergencies  See telemetry face sheet for immediately available ER MD    Location  [facility]-Cardiac & Pulmonary Rehab    Staff Present  Renita Papa, RN Vickki Hearing, BA, ACSM CEP, Exercise Physiologist;Joseph Tessie Fass RCP,RRT,BSRT    Virtual Visit  No    Medication changes reported      No    Fall or balance concerns reported     No    Warm-up and Cool-down  Performed on first and last piece of equipment    Resistance Training Performed  Yes    VAD Patient?  No    PAD/SET Patient?  No      Pain Assessment   Currently in Pain?  No/denies          Social History   Tobacco Use  Smoking Status Never Smoker  Smokeless Tobacco Never Used    Goals Met:  Independence with exercise equipment Exercise tolerated well No report of cardiac concerns or symptoms Strength training completed today  Goals Unmet:  Not Applicable  Comments: Pt able to follow exercise prescription today without complaint.  Will continue to monitor for progression.    Dr. Emily Filbert is Medical Director for [place] and [facility].

## 2019-09-11 ENCOUNTER — Encounter: Payer: Medicare Other | Attending: Internal Medicine | Admitting: *Deleted

## 2019-09-11 VITALS — Ht 68.9 in | Wt 255.7 lb

## 2019-09-11 DIAGNOSIS — Z955 Presence of coronary angioplasty implant and graft: Secondary | ICD-10-CM

## 2019-09-11 NOTE — Progress Notes (Signed)
Daily Session Note  Patient Details  Name: EDKER PUNT MRN: [id number] Date of Birth: December 21, 1945 Referring Provider:     Cardiac Rehab from 02/13/2019 in [facility] Cardiac and Pulmonary Rehab  Referring Provider  End      Encounter Date: 09/11/2019  Check In: Session Check In - 09/11/19 0953      Check-In   Supervising physician immediately available to respond to emergencies  See telemetry face sheet for immediately available ER MD    Location  [facility]-Cardiac & Pulmonary Rehab    Staff Present  Renita Papa, RN BSN;Jessica Luan Pulling, MA, RCEP, CCRP, CCET;Amanda Sommer, [place], ACSM CEP, Exercise Physiologist    Virtual Visit  No    Medication changes reported      No    Fall or balance concerns reported     No    Warm-up and Cool-down  Performed on first and last piece of equipment    Resistance Training Performed  Yes    VAD Patient?  No    PAD/SET Patient?  No      Pain Assessment   Currently in Pain?  No/denies          Social History   Tobacco Use  Smoking Status Never Smoker  Smokeless Tobacco Never Used    Goals Met:  Independence with exercise equipment Exercise tolerated well No report of cardiac concerns or symptoms Strength training completed today  Goals Unmet:  Not Applicable  Comments: Pt able to follow exercise prescription today without complaint.  Will continue to monitor for progression.  [place] Name 09/11/19 1106         6 Minute Walk   Phase  Discharge     Distance  1530 feet     Distance % Change  27.5 %     Distance Feet Change  330 ft     Walk Time  6 minutes     # of Rest Breaks  0     MPH  2.9     METS  2.65     RPE  12     VO2 Peak  9.27     Symptoms  No     Resting HR  69 bpm     Resting BP  126/74     Max Ex. HR  95 bpm     Max Ex. BP  158/76         Dr. Emily Filbert is Medical Director for [place] and [facility].

## 2019-09-17 ENCOUNTER — Encounter: Payer: Self-pay | Admitting: *Deleted

## 2019-09-17 DIAGNOSIS — Z955 Presence of coronary angioplasty implant and graft: Secondary | ICD-10-CM

## 2019-09-17 NOTE — Progress Notes (Signed)
Cardiac Individual Treatment Plan  Patient Details  Norton: Jesse Norton MRN: [id number] Date of Birth: 11-12-1946 Referring Provider:     Cardiac Rehab from 02/13/2019 in [facility] Cardiac and Pulmonary Rehab  Referring Provider  End      Initial Encounter Date:    Cardiac Rehab from 02/13/2019 in [facility] Cardiac and Pulmonary Rehab  Date  02/13/19      Visit Diagnosis: Status post coronary artery stent placement  Patient's Home Medications on Admission:  Current Outpatient Medications:  .  allopurinol (ZYLOPRIM) 300 MG tablet, Take 1 tablet (300 mg total) by mouth daily., Disp: 90 tablet, Rfl: 4 .  amLODipine (NORVASC) 10 MG tablet, Take 1 tablet (10 mg total) by mouth daily., Disp: 90 tablet, Rfl: 4 .  aspirin EC 81 MG tablet, Take 81 mg by mouth daily., Disp: , Rfl:  .  atorvastatin (LIPITOR) 40 MG tablet, Take 1 tablet (40 mg total) by mouth daily., Disp: 90 tablet, Rfl: 4 .  clopidogrel (PLAVIX) 75 MG tablet, Take 1 tablet (75 mg total) by mouth daily with breakfast., Disp: 90 tablet, Rfl: 3 .  FLUoxetine (PROZAC) 20 MG capsule, Take 1 capsule (20 mg total) by mouth daily., Disp: 90 capsule, Rfl: 4 .  furosemide (LASIX) 20 MG tablet, Take 1 tablet (20 mg total) by mouth daily for 30 days. (Patient taking differently: Take 20 mg by mouth daily. Daily), Disp: 90 tablet, Rfl: 3 .  LORazepam (ATIVAN) 1 MG tablet, Take 0.5-1 tablets (0.5-1 mg total) by mouth daily as needed for anxiety., Disp: 30 tablet, Rfl: 1 .  losartan (COZAAR) 100 MG tablet, Take 1 tablet (100 mg total) by mouth daily., Disp: 90 tablet, Rfl: 3 .  metoprolol tartrate (LOPRESSOR) 25 MG tablet, Take 1 tablet (25 mg total) by mouth 2 (two) times daily., Disp: 180 tablet, Rfl: 3 .  Multiple Vitamin (MULTIVITAMIN) tablet, Take 1 tablet by mouth daily., Disp: , Rfl:  .  Multiple Vitamins-Minerals (EMERGEN-C IMMUNE PO), Take 1 tablet by mouth every other day. , Disp: , Rfl:  .  pantoprazole (PROTONIX) 20 MG tablet, Take 1 tablet  (20 mg total) by mouth 2 (two) times daily., Disp: 180 tablet, Rfl: 4  Past Medical History: Past Medical History:  Diagnosis Date  . (HFpEF) heart failure with preserved ejection fraction ([place])    a. 01/2019 Cath: EF 55-60% w/ elevated LVEDP.  Marland Kitchen Anxiety   . Barrett's esophagus   . CAD (coronary artery disease)    a. 10/2018 ETT: 81m horizontal inflat ST depression, freq PVC's->Intermediate; b. 12/2018 Cardiac CTA: LM nl, LAD FFRct 0.6 in mLAD, LCX FFRct 0.99p, 0.865m0.77d, RCA no signif dzs, FFRct 0.89d->Rec cath; c. 01/2019 PCI: LM nl, LAD 40p, 8553m2 70, LCX 70p (2.75x15 Resolute Onyx DES), RCA min irregs. EF 55-65%. EDP 25-68m16m d. 01/2019 PCI: LAD 80-90 (atherectomy & 3x15 Resolute Onyx DES).  . Colon polyps   . Duodenal adenoma   . Gastritis   . GERD (gastroesophageal reflux disease)   . Gout   . H. pylori infection   . H/O urethral stricture   . Hemorrhoids   . Hiatal hernia   . Hypertension   . Stomach ulcer     Tobacco Use: Social History   Tobacco Use  Smoking Status Never Smoker  Smokeless Tobacco Never Used    Labs: Recent Review Flowsheet Data    Labs for ITP Cardiac and Pulmonary Rehab Latest Ref Rng & Units 05/30/2016 06/07/2017 06/26/2018 02/12/2019 08/19/2019  Cholestrol 100 - 199 mg/dL 181 166 169 124 151   LDLCALC 0 - 99 mg/dL 107(H) 85 98 - 55   HDL >39 mg/dL 62 56 59 - 82   Trlycerides 0 - 149 mg/dL 61 127 62 58 72       Exercise Target Goals: Exercise Program Goal: Individual exercise prescription set using results from initial 6 min walk test and THRR while considering  patient's activity barriers and safety.   Exercise Prescription Goal: Initial exercise prescription builds to 30-45 minutes a day of aerobic activity, 2-3 days per week.  Home exercise guidelines will be given to patient during program as part of exercise prescription that the participant will acknowledge.  Activity Barriers & Risk Stratification:   6 Minute Walk: 6 Minute Walk     Row Norton 09/11/19 1106         6 Minute Walk   Phase  Discharge     Distance  1530 feet     Distance % Change  27.5 %     Distance Feet Change  330 ft     Walk Time  6 minutes     # of Rest Breaks  0     MPH  2.9     METS  2.65     RPE  12     VO2 Peak  9.27     Symptoms  No     Resting HR  69 bpm     Resting BP  126/74     Max Ex. HR  95 bpm     Max Ex. BP  158/76        Oxygen Initial Assessment:   Oxygen Re-Evaluation:   Oxygen Discharge (Final Oxygen Re-Evaluation):   Initial Exercise Prescription:   Perform Capillary Blood Glucose checks as needed.  Exercise Prescription Changes: Exercise Prescription Changes    Row Norton 07/14/19 1000 08/01/19 1100 08/12/19 1500 08/29/19 0700 09/09/19 1400     Response to Exercise   Blood Pressure (Admit)  136/80  130/78  118/60  124/70  144/84   Blood Pressure (Exercise)  144/60  160/80  -  152/60  140/64   Blood Pressure (Exit)  138/78  122/78  138/78  138/78  122/68   Heart Rate (Admit)  63 bpm  72 bpm  70 bpm  63 bpm  74 bpm   Heart Rate (Exercise)  122 bpm  115 bpm  115 bpm  97 bpm  114 bpm   Heart Rate (Exit)  92 bpm  75 bpm  84 bpm  78 bpm  81 bpm   Rating of Perceived Exertion (Exercise)  '13  13  12  12  13   '$ Symptoms  none  none  none  none  none   Duration  Continue with 30 min of aerobic exercise without signs/symptoms of physical distress.  Continue with 30 min of aerobic exercise without signs/symptoms of physical distress.  Continue with 30 min of aerobic exercise without signs/symptoms of physical distress.  Continue with 30 min of aerobic exercise without signs/symptoms of physical distress.  Continue with 30 min of aerobic exercise without signs/symptoms of physical distress.   Intensity  THRR unchanged  THRR unchanged  THRR unchanged  THRR unchanged  THRR unchanged     Progression   Progression  Continue to progress workloads to maintain intensity without signs/symptoms of physical distress.  Continue to  progress workloads to maintain intensity without signs/symptoms of physical distress.  Continue to  progress workloads to maintain intensity without signs/symptoms of physical distress.  Continue to progress workloads to maintain intensity without signs/symptoms of physical distress.  Continue to progress workloads to maintain intensity without signs/symptoms of physical distress.   Average METs  3.04  3.7  4.3  3.8  3.22     Resistance Training   Training Prescription  Yes  Yes  Yes  Yes  Yes   Weight  4 lbs  10 lb  10 lb  10 lb  10 lbs   Reps  10-15  10-15  10-15  10-15  10-15     Interval Training   Interval Training  No  No  No  No  No     Treadmill   MPH  2.3  2.6  3.2  2.9  3.2   Grade  0.'5  2  2  '$ 3.5  2.5   Minutes  '15  15  15  15  15   '$ METs  2.92  3.71  4.33  4.33  4.55     NuStep   Level  3  4  -  5  5   Minutes  15  15  -  15  15   METs  4.3  2.7  -  3.2  3.2     Recumbant Elliptical   Level  1  3  -  3  3   Minutes  15  15  -  15  15   METs  1.9  2.4  -  4.2  2     Elliptical   Level  '1  2  3  '$ -  2   Speed  4.1  -  -  -  4.1   Minutes  '15  15  15  '$ -  15     Home Exercise Plan   Plans to continue exercise at  Home (comment) walking, biking  Home (comment) walking, biking  Home (comment) walking, biking  Home (comment) walking, biking  Home (comment) walking, biking   Frequency  Add 2 additional days to program exercise sessions.  Add 2 additional days to program exercise sessions.  Add 2 additional days to program exercise sessions.  Add 2 additional days to program exercise sessions.  Add 2 additional days to program exercise sessions.   Initial Home Exercises Provided  03/21/19  03/21/19  03/21/19  03/21/19  03/21/19      Exercise Comments:   Exercise Goals and Review:   Exercise Goals Re-Evaluation : Exercise Goals Re-Evaluation    Row Norton 07/02/19 1414 07/14/19 0959 07/17/19 1534 07/30/19 1418 08/01/19 1115     Exercise Goal Re-Evaluation   Exercise  Goals Review  Increase Physical Activity;Able to understand and use rate of perceived exertion (RPE) scale;Knowledge and understanding of Target Heart Rate Range (THRR);Understanding of Exercise Prescription;Increase Strength and Stamina;Able to check pulse independently  Increase Physical Activity;Increase Strength and Stamina;Understanding of Exercise Prescription  Increase Physical Activity  Increase Physical Activity;Increase Strength and Stamina;Able to understand and use rate of perceived exertion (RPE) scale;Knowledge and understanding of Target Heart Rate Range (THRR);Able to check pulse independently;Understanding of Exercise Prescription  Increase Physical Activity;Increase Strength and Stamina;Able to understand and use rate of perceived exertion (RPE) scale;Knowledge and understanding of Target Heart Rate Range (THRR);Able to check pulse independently;Understanding of Exercise Prescription   Comments  Reviewed RPE scale, THR and program prescription with pt today.  Pt voiced understanding and was given a copy of goals to  take home.  Jesse Norton is off to a good start. He has even started on the elliptical and up to 3 METs on the NuStep. We will continue to monitor his progress.  Jesse Norton talked about his home exercise routine today.  walking alternate days, biking 10 miles or rollers, cardio. USes stationary bike .  6 days a week routine  Jesse Norton continues his home exercise.  He is checking HR etc and maintains consistent RPM on bike.  Jesse Norton is progressing well in HT.  He is up to 10 lb weights and has increased levels on all machines   Expected Outcomes  Short: Use RPE daily to regulate intensity. Long: Follow program prescription in THR.  Short: Continue to increase workloads.  Long: Continue to increase strength and stamina.  Short: Continue to increase workloads.  Long: Continue to increase strength and stamina.  -  Short - continue to attend consistently Long - increase overall MET level   Row Norton  08/12/19 1551 08/27/19 1409 08/29/19 0753 09/09/19 1425       Exercise Goal Re-Evaluation   Exercise Goals Review  Increase Physical Activity;Increase Strength and Stamina;Able to understand and use rate of perceived exertion (RPE) scale;Able to understand and use Dyspnea scale;Knowledge and understanding of Target Heart Rate Range (THRR);Able to check pulse independently;Understanding of Exercise Prescription  Increase Physical Activity;Increase Strength and Stamina;Understanding of Exercise Prescription  Increase Physical Activity;Increase Strength and Stamina;Able to understand and use rate of perceived exertion (RPE) scale;Knowledge and understanding of Target Heart Rate Range (THRR);Able to check pulse independently;Understanding of Exercise Prescription  Increase Physical Activity;Increase Strength and Stamina;Understanding of Exercise Prescription    Comments  Jesse Norton is up to 4.3 METS on TM.  Staff will monitor progress  Jesse Norton is doing well in rehab.  He is exercising at home consistently.  Friday hikes, Saturday hike and bike, Sunday stretch and balance, Monday bike.  He feels like his strength and stamina are doing well.  Jesse Norton is very consistent with his exercise at home and attendance at rehab.  He is meeting program goals.  Jesse Norton continues to do well in rehab.  He is hiking again and doing well.  He is doing well on the elliptical and feeling stronger.  He is up to 2.5% on the treadmill!  We will continue to monitor his progress.    Expected Outcomes  Short - consider interval training Long - maintain increase in MET level  Short: Continue to add in intervals.  Long: continue to exericse on own.  Short - continue intervals Long - maintain current fitness level  Short: Contnue to add in intervlas even on upright equipment. Long: Continue to improve stamina.       Discharge Exercise Prescription (Final Exercise Prescription Changes): Exercise Prescription Changes - 09/09/19 1400       Response to Exercise   Blood Pressure (Admit)  144/84    Blood Pressure (Exercise)  140/64    Blood Pressure (Exit)  122/68    Heart Rate (Admit)  74 bpm    Heart Rate (Exercise)  114 bpm    Heart Rate (Exit)  81 bpm    Rating of Perceived Exertion (Exercise)  13    Symptoms  none    Duration  Continue with 30 min of aerobic exercise without signs/symptoms of physical distress.    Intensity  THRR unchanged      Progression   Progression  Continue to progress workloads to maintain intensity without signs/symptoms of physical distress.  Average METs  3.22      Resistance Training   Training Prescription  Yes    Weight  10 lbs    Reps  10-15      Interval Training   Interval Training  No      Treadmill   MPH  3.2    Grade  2.5    Minutes  15    METs  4.55      NuStep   Level  5    Minutes  15    METs  3.2      Recumbant Elliptical   Level  3    Minutes  15    METs  2      Elliptical   Level  2    Speed  4.1    Minutes  15      Home Exercise Plan   Plans to continue exercise at  Home (comment)   walking, biking   Frequency  Add 2 additional days to program exercise sessions.    Initial Home Exercises Provided  03/21/19       Nutrition:  Target Goals: Understanding of nutrition guidelines, daily intake of sodium '1500mg'$ , cholesterol '200mg'$ , calories 30% from fat and 7% or less from saturated fats, daily to have 5 or more servings of fruits and vegetables.  Biometrics:  Post Biometrics - 09/11/19 1107       Post  Biometrics   Height  5' 8.9" (1.75 m)    Weight  255 lb 11.2 oz (116 kg)    BMI (Calculated)  37.87    Single Leg Stand  30 seconds       Nutrition Therapy Plan and Nutrition Goals: Nutrition Therapy & Goals - 07/07/19 1510      Nutrition Therapy   Diet  Low Na HH diet    Drug/Food Interactions  Statins/Certain Fruits   Lipitor   Protein (specify units)  95g    Fiber  30 grams    Whole Grain Foods  3 servings    Saturated Fats  12  max. grams    Fruits and Vegetables  5 servings/day    Sodium  1.5 grams      Personal Nutrition Goals   Nutrition Goal  ST: navigate HH eating LT: get back into shape    Comments  Pt is on Lasixs (checks his legs every morning). Pt reports having good days and bad days. Pt wants help with his trip food list (pt provided - will review and disucss). Pt reports trying to choose lean meats and low sodium options as well as vegetables. Pt asked about asian cuisine and sodium intake; discussed that he could choose low sodium options and make some things himself, but in terms of sauce the biggest impact would be managing quantity. Discussed HH diet and low Na. Pt food list: B: eggs, bacon, coffee, juice. Oatmeal, prunes, brown sugar, coffee. cold cereal, fruit cup, powdered milk, coffee (granola, cheerios). pancakes, blueberry syrup, coffee. L: canned salmon or canned chicken and cheese wrap, chips, candy bar. Instant soup, nabs (peanut butter or cheese crackers), summer sausage, cookies. Cold cuts, apple, pickles, rolls. Canned tuna or smoked oysters, nabs, cookies, mayo and pickles. D: country ham, pinto beans, cornbread, pudding. steak, instant potatoes, canned peas, cookies. spam/canned chicken, rice, string beans, blueberry or apple muffins, pineapple chunks. canned lasanga/ spaghetti, cheese, pregos sauce, muffins, pudding.[place] stew, muffins.      Intervention Plan   Intervention  Prescribe, educate and counsel  regarding individualized specific dietary modifications aiming towards targeted core components such as weight, hypertension, lipid management, diabetes, heart failure and other comorbidities.;Nutrition handout(s) given to patient.    Expected Outcomes  Short Term Goal: Understand basic principles of dietary content, such as calories, fat, sodium, cholesterol and nutrients.;Short Term Goal: A plan has been developed with personal nutrition goals set during dietitian appointment.;Long Term Goal:  Adherence to prescribed nutrition plan.       Nutrition Assessments:   Nutrition Goals Re-Evaluation: Nutrition Goals Re-Evaluation    Jesse Norton 07/24/19 1024 08/27/19 1424           Goals   Current Weight  -  256 lb 11.2 oz (116.4 kg)      Nutrition Goal  ST: navigate HH eating LT: get back into shape  ST: continue HH eating LT: get back into shape      Comment  Gave pt canoe trip meal tips and 2 sample meals for B, L, and D. Disscussed what he could do to make his trips healthier, pt asked about his spam, told him since he only has it once per year on these trips, it's not very concerning, but there really aren't equal replacements for that. Disscussed why spam may not be a desirable choice (Na content, processed meat)  berries + oatmeal/yogurt/smoothie. L: salad w/tuna or fruit, or [place] sandwich w/mustard whole wheat. D: work in progress, chicken, salmon, tuna, [place] (grill - cut back on red meat) added lots of frozen veggies evoo and mrs dash. Pt reports he likes what he is eating enough, but reports that he feels healthy food doesn't taste as good as less healthy food, encouraged pt and told him some ways he could enhance the flavors and if he ever wanted any help or resources to help with this. Pt reports having an elixer (green apple, ceyeen, tumeric, black pepper, water) discussed how he could get those benefits from eating those foods.      Expected Outcome  ST: navigate HH eating LT: get back into shape  ST: navigate HH eating LT: get back into shape         Nutrition Goals Discharge (Final Nutrition Goals Re-Evaluation): Nutrition Goals Re-Evaluation - 08/27/19 1424      Goals   Current Weight  256 lb 11.2 oz (116.4 kg)    Nutrition Goal  ST: continue HH eating LT: get back into shape    Comment  berries + oatmeal/yogurt/smoothie. L: salad w/tuna or fruit, or [place] sandwich w/mustard whole wheat. D: work in progress, chicken, salmon, tuna, [place] (grill - cut back on red  meat) added lots of frozen veggies evoo and mrs dash. Pt reports he likes what he is eating enough, but reports that he feels healthy food doesn't taste as good as less healthy food, encouraged pt and told him some ways he could enhance the flavors and if he ever wanted any help or resources to help with this. Pt reports having an elixer (green apple, ceyeen, tumeric, black pepper, water) discussed how he could get those benefits from eating those foods.    Expected Outcome  ST: navigate HH eating LT: get back into shape       Psychosocial: Target Goals: Acknowledge presence or absence of significant depression and/or stress, maximize coping skills, provide positive support system. Participant is able to verbalize types and ability to use techniques and skills needed for reducing stress and depression.   Initial Review & Psychosocial Screening:   Quality of  Life Scores:   Scores of 19 and below usually indicate a poorer quality of life in these areas.  A difference of  2-3 points is a clinically meaningful difference.  A difference of 2-3 points in the total score of the Quality of Life Index has been associated with significant improvement in overall quality of life, self-image, physical symptoms, and general health in studies assessing change in quality of life.  PHQ-9: Recent Review Flowsheet Data    Depression screen [facility] 2/9 08/19/2019 06/04/2019 02/13/2019 02/12/2019 05/23/2018   Decreased Interest 0 0 0 0 0   Down, Depressed, Hopeless 0 1 0 0 1   PHQ - 2 Score 0 1 0 0 1   Altered sleeping 0 - '1 1 1   '$ Tired, decreased energy 0 - 0 0 1   Change in appetite 0 - 0 0 0   Feeling bad or failure about yourself  0 - 0 0 0   Trouble concentrating 0 - 0 0 1   Moving slowly or fidgety/restless 0 - 0 0 1   Suicidal thoughts 0 - 0 0 0   PHQ-9 Score 0 - '1 1 5   '$ Difficult doing work/chores Not difficult at all - Not difficult at all Not difficult at all Not difficult at all     Interpretation of Total  Score  Total Score Depression Severity:  1-4 = Minimal depression, 5-9 = Mild depression, 10-14 = Moderate depression, 15-19 = Moderately severe depression, 20-27 = Severe depression   Psychosocial Evaluation and Intervention:   Psychosocial Re-Evaluation: Psychosocial Re-Evaluation    Row Norton 07/10/19 1429 07/30/19 1421 08/27/19 1412         Psychosocial Re-Evaluation   Current issues with  Current Psychotropic Meds;Current Anxiety/Panic;Current Stress Concerns  Current Stress Concerns  Current Stress Concerns;Current Sleep Concerns     Comments  His son has been making some life decisions that has been attributing to his stress. Informed patient that it is not his fault and sometimes those issues are out of his control. He is taking porozac and has been helping with his anxiety. Spoke to patient about continuing exercise to help reduce stress. He has a great attitude and wants to me able to do more after rehab.  Jesse Norton has been meditatin at night before bed using youtube realxing music and self guided relaxation.  He often goes to sleep during this time and states "it really works to reduce stress."  Jesse Norton is doing well mentally.  He denies symptoms of depression and anxiety.  He still is sleeping well with using his mediation music and able to do go to sleep quickly.  He would like to get more sleep as the dogs get him up early. He is trying to aim for 6-8 hours of sleep.  He has really enjoyed getting to know the staff and the postivity that we provide.     Expected Outcomes  Short: continue medications and exercise. Long: reduce stressors to help his overall well being.  Short - continue exercise and meditation Long - manage stress independently  Short: Continue to exercise and use mediation.  Long: Continue to stay positive.     Interventions  Encouraged to attend Cardiac Rehabilitation for the exercise  -  Encouraged to attend Cardiac Rehabilitation for the exercise     Continue  Psychosocial Services   -  -  Follow up required by staff        Psychosocial Discharge (Final Psychosocial Re-Evaluation): Psychosocial  Re-Evaluation - 08/27/19 1412      Psychosocial Re-Evaluation   Current issues with  Current Stress Concerns;Current Sleep Concerns    Comments  Jesse Norton is doing well mentally.  He denies symptoms of depression and anxiety.  He still is sleeping well with using his mediation music and able to do go to sleep quickly.  He would like to get more sleep as the dogs get him up early. He is trying to aim for 6-8 hours of sleep.  He has really enjoyed getting to know the staff and the postivity that we provide.    Expected Outcomes  Short: Continue to exercise and use mediation.  Long: Continue to stay positive.    Interventions  Encouraged to attend Cardiac Rehabilitation for the exercise    Continue Psychosocial Services   Follow up required by staff       Vocational Rehabilitation: Provide vocational rehab assistance to qualifying candidates.   Vocational Rehab Evaluation & Intervention:   Education: Education Goals: Education classes will be provided on a variety of topics geared toward better understanding of heart health and risk factor modification. Participant will state understanding/return demonstration of topics presented as noted by education test scores.  Learning Barriers/Preferences:   Education Topics:  AED/CPR: - Group verbal and written instruction with the use of models to demonstrate the basic use of the AED with the basic ABC's of resuscitation.   General Nutrition Guidelines/Fats and Fiber: -Group instruction provided by verbal, written material, models and posters to present the general guidelines for heart healthy nutrition. Gives an explanation and review of dietary fats and fiber.   Controlling Sodium/Reading Food Labels: -Group verbal and written material supporting the discussion of sodium use in heart healthy nutrition.  Review and explanation with models, verbal and written materials for utilization of the food label.   Exercise Physiology & General Exercise Guidelines: - Group verbal and written instruction with models to review the exercise physiology of the cardiovascular system and associated critical values. Provides general exercise guidelines with specific guidelines to those with heart or lung disease.    Aerobic Exercise & Resistance Training: - Gives group verbal and written instruction on the various components of exercise. Focuses on aerobic and resistive training programs and the benefits of this training and how to safely progress through these programs..   Flexibility, Balance, Mind/Body Relaxation: Provides group verbal/written instruction on the benefits of flexibility and balance training, including mind/body exercise modes such as yoga, pilates and tai chi.  Demonstration and skill practice provided.   Stress and Anxiety: - Provides group verbal and written instruction about the health risks of elevated stress and causes of high stress.  Discuss the correlation between heart/lung disease and anxiety and treatment options. Review healthy ways to manage with stress and anxiety.   Depression: - Provides group verbal and written instruction on the correlation between heart/lung disease and depressed mood, treatment options, and the stigmas associated with seeking treatment.   Anatomy & Physiology of the Heart: - Group verbal and written instruction and models provide basic cardiac anatomy and physiology, with the coronary electrical and arterial systems. Review of Valvular disease and Heart Failure   Cardiac Procedures: - Group verbal and written instruction to review commonly prescribed medications for heart disease. Reviews the medication, class of the drug, and side effects. Includes the steps to properly store meds and maintain the prescription regimen. (beta blockers and  nitrates)   Cardiac Medications I: - Group verbal and written instruction to  review commonly prescribed medications for heart disease. Reviews the medication, class of the drug, and side effects. Includes the steps to properly store meds and maintain the prescription regimen.   Cardiac Medications II: -Group verbal and written instruction to review commonly prescribed medications for heart disease. Reviews the medication, class of the drug, and side effects. (all other drug classes)    Go Sex-Intimacy & Heart Disease, Get SMART - Goal Setting: - Group verbal and written instruction through game format to discuss heart disease and the return to sexual intimacy. Provides group verbal and written material to discuss and apply goal setting through the application of the S.M.A.R.T. Method.   Other Matters of the Heart: - Provides group verbal, written materials and models to describe Stable Angina and Peripheral Artery. Includes description of the disease process and treatment options available to the cardiac patient.   Exercise & Equipment Safety: - Individual verbal instruction and demonstration of equipment use and safety with use of the equipment.   Cardiac Rehab from 02/13/2019 in [facility] Cardiac and Pulmonary Rehab  Date  02/13/19  Educator  [facility]  Instruction Review Code  1- Verbalizes Understanding      Infection Prevention: - Provides verbal and written material to individual with discussion of infection control including proper hand washing and proper equipment cleaning during exercise session.   Cardiac Rehab from 02/13/2019 in [facility] Cardiac and Pulmonary Rehab  Date  02/13/19  Educator  [facility]  Instruction Review Code  1- Verbalizes Understanding      Falls Prevention: - Provides verbal and written material to individual with discussion of falls prevention and safety.   Cardiac Rehab from 02/13/2019 in [facility] Cardiac and Pulmonary Rehab  Date  02/13/19  Educator  [facility]  Instruction Review  Code  1- Verbalizes Understanding      Diabetes: - Individual verbal and written instruction to review signs/symptoms of diabetes, desired ranges of glucose level fasting, after meals and with exercise. Acknowledge that pre and post exercise glucose checks will be done for 3 sessions at entry of program.   Know Your Numbers and Risk Factors: -Group verbal and written instruction about important numbers in your health.  Discussion of what are risk factors and how they play a role in the disease process.  Review of Cholesterol, Blood Pressure, Diabetes, and BMI and the role they play in your overall health.   Sleep Hygiene: -Provides group verbal and written instruction about how sleep can affect your health.  Define sleep hygiene, discuss sleep cycles and impact of sleep habits. Review good sleep hygiene tips.    Other: -Provides group and verbal instruction on various topics (see comments)   Knowledge Questionnaire Score:   Core Components/Risk Factors/Patient Goals at Admission:   Core Components/Risk Factors/Patient Goals Review:  Goals and Risk Factor Review    Row Norton 07/10/19 1441 07/30/19 1416 08/27/19 1410         Core Components/Risk Factors/Patient Goals Review   Personal Goals Review  Weight Management/Obesity;Lipids;Hypertension  -  Weight Management/Obesity;Lipids;Hypertension     Review  Patient states he is taking his medications at home and checking his blood pressure. He was checking his blood pressure after lunch and was not resting enough for a true resting blood pressure. Informed patient to take his blood pressure everday at the same time for a week to get a true resting blood pressure.  Jesse Norton is taking meds and BP at home.  he has no other current concerns.  Jesse Norton is doing  well in rehab.  He got good reports at both his cardiologist and primary.  He is working on weight loss still.  Starting next week, he will reduce some calories to try to get a jump  start. He is doing well with his pressures and continues to check them at home.     Expected Outcomes  Short: check blood pressure daily. Long: maintain a healthy blood pressure.  Short - continue checking BP Long - maintain healthy BP  Short: Continue to work on weight loss.  Long: Continue to monitor risk factors.        Core Components/Risk Factors/Patient Goals at Discharge (Final Review):  Goals and Risk Factor Review - 08/27/19 1410      Core Components/Risk Factors/Patient Goals Review   Personal Goals Review  Weight Management/Obesity;Lipids;Hypertension    Review  Jesse Norton is doing well in rehab.  He got good reports at both his cardiologist and primary.  He is working on weight loss still.  Starting next week, he will reduce some calories to try to get a jump start. He is doing well with his pressures and continues to check them at home.    Expected Outcomes  Short: Continue to work on weight loss.  Long: Continue to monitor risk factors.       ITP Comments: ITP Comments    Row Norton 04/15/19 1047 05/01/19 1604 06/25/19 1334 07/02/19 1415 07/23/19 0602   ITP Comments  Jesse Norton is still exercising at home - up to walking 3 miles at 3 mph.  He is still riding his bike alternate days.  He is keeping a log of his food.  I will email Melissa about nutrition consult.   Jesse Norton is riding his bike at [employer] and walking.  He is monitoring his BP at home at rest and during exercise.  I will send Jesse Norton his email as they havent been able to touch base via phone.  30 day review cycle restarting  after being closed since March 16 because of  Covid 19 pandemic. Program opened to patients on July 6. Not all have returned. ITP updated and sent to Medical Director for review,changes as needed and signature  First full day of exercise!  Patient was oriented to gym and equipment including functions, settings, policies, and procedures.  Patient's individual exercise prescription and treatment plan were  reviewed.  All starting workloads were established based on the results of the 6 minute walk test done at initial orientation visit.  The plan for exercise progression was also introduced and progression will be customized based on patient's performance and goals.  30 Day Review Completed today. Continue with ITP unless changed by Medical Director review.  New to program   Row Norton 08/20/19 0612 09/17/19 1233         ITP Comments  30 Day review. Continue with ITP unless directed changes per Medical Director review.  30 day review completed. ITP sent to Dr. Emily Filbert, Medical Director of Cardiac and Pulmonary Rehab. Continue with ITP unless changes are made by physician.  Department closed starting 10/2 until further notice by infection prevention and Health at Work teams for [place].         Comments: 30 day review

## 2019-09-23 ENCOUNTER — Other Ambulatory Visit: Payer: Self-pay

## 2019-09-23 ENCOUNTER — Encounter: Payer: Medicare Other | Admitting: *Deleted

## 2019-09-23 DIAGNOSIS — Z955 Presence of coronary angioplasty implant and graft: Secondary | ICD-10-CM

## 2019-09-23 NOTE — Progress Notes (Signed)
Daily Session Note  Patient Details  Name: Jesse Norton MRN: [id number] Date of Birth: 1946-07-04 Referring Provider:     Cardiac Rehab from 02/13/2019 in [facility] Cardiac and Pulmonary Rehab  Referring Provider  End      Encounter Date: 09/23/2019  Check In: Session Check In - 09/23/19 1002      Check-In   Supervising physician immediately available to respond to emergencies  See telemetry face sheet for immediately available ER MD    Location  [facility]-Cardiac & Pulmonary Rehab    Staff Present  Heath Lark, RN, BSN, CCRP;Laureen Owens Shark, BS, RRT, CPFT;Joseph Wellington [employer]    Virtual Visit  No    Medication changes reported      No    Fall or balance concerns reported     No    Warm-up and Cool-down  Performed on first and last piece of equipment    Resistance Training Performed  Yes    VAD Patient?  No    PAD/SET Patient?  No      Pain Assessment   Currently in Pain?  No/denies          Social History   Tobacco Use  Smoking Status Never Smoker  Smokeless Tobacco Never Used    Goals Met:  Independence with exercise equipment Exercise tolerated well No report of cardiac concerns or symptoms  Goals Unmet:  Not Applicable  Comments: Pt able to follow exercise prescription today without complaint.  Will continue to monitor for progression.    Dr. Emily Filbert is Medical Director for [facility] and [facility].

## 2019-09-24 ENCOUNTER — Encounter: Payer: Medicare Other | Admitting: *Deleted

## 2019-09-24 DIAGNOSIS — Z955 Presence of coronary angioplasty implant and graft: Secondary | ICD-10-CM

## 2019-09-24 NOTE — Progress Notes (Signed)
Daily Session Note  Patient Details  Name: Jesse Norton MRN: [id number] Date of Birth: [birth year] Referring Provider:     Cardiac Rehab from 02/13/2019 in [facility] Cardiac and Pulmonary Rehab  Referring Provider  End      Encounter Date: 09/24/2019  Check In: Session Check In - 09/24/19 1401      Check-In   Supervising physician immediately available to respond to emergencies  See telemetry face sheet for immediately available ER MD    Location  [facility]-Cardiac & Pulmonary Rehab    Staff Present  Renita Papa, RN BSN;Jessica Luan Pulling, MA, RCEP, CCRP, CCET;Melissa [place] RDN, LDN    Virtual Visit  No    Medication changes reported      No    Fall or balance concerns reported     No    Warm-up and Cool-down  Performed on first and last piece of equipment    Resistance Training Performed  Yes    VAD Patient?  No    PAD/SET Patient?  No      Pain Assessment   Currently in Pain?  No/denies          Social History   Tobacco Use  Smoking Status Never Smoker  Smokeless Tobacco Never Used    Goals Met:  Independence with exercise equipment Exercise tolerated well No report of cardiac concerns or symptoms Strength training completed today  Goals Unmet:  Not Applicable  Comments: Pt able to follow exercise prescription today without complaint.  Will continue to monitor for progression.    Dr. Emily Filbert is Medical Director for [facility] and [facility].

## 2019-09-25 ENCOUNTER — Encounter: Payer: Medicare Other | Admitting: *Deleted

## 2019-09-25 ENCOUNTER — Other Ambulatory Visit: Payer: Self-pay

## 2019-09-25 DIAGNOSIS — Z955 Presence of coronary angioplasty implant and graft: Secondary | ICD-10-CM

## 2019-09-25 NOTE — Progress Notes (Signed)
Daily Session Note  Patient Details  Name: Jesse Norton MRN: [id number] Date of Birth: 09/27/1946 Referring Provider:     Cardiac Rehab from 02/13/2019 in [facility] Cardiac and Pulmonary Rehab  Referring Provider  End      Encounter Date: 09/25/2019  Check In: Session Check In - 09/25/19 1006      Check-In   Supervising physician immediately available to respond to emergencies  See telemetry face sheet for immediately available ER MD    Location  [facility]-Cardiac & Pulmonary Rehab    Staff Present  Susanne Bice, RN, BSN, CCRP;Joseph Hood RCP,RRT,BSRT;Jessica Hawkins, MA, RCEP, CCRP, CCET    Virtual Visit  No    Medication changes reported      No    Fall or balance concerns reported     No    Warm-up and Cool-down  Performed on first and last piece of equipment    Resistance Training Performed  Yes    VAD Patient?  No    PAD/SET Patient?  No      Pain Assessment   Currently in Pain?  No/denies          Social History   Tobacco Use  Smoking Status Never Smoker  Smokeless Tobacco Never Used    Goals Met:  Independence with exercise equipment Exercise tolerated well No report of cardiac concerns or symptoms  Goals Unmet:  Not Applicable  Comments: Pt able to follow exercise prescription today without complaint.  Will continue to monitor for progression.    Dr. Mark Miller is Medical Director for [facility] and [facility]. 

## 2019-09-25 NOTE — Patient Instructions (Signed)
Discharge Patient Instructions  Patient Details  Name: Jesse Norton MRN: [id number] Date of Birth: January 26, 1946 Referring Provider:  Nelva Bush, MD   Number of Visits: 59  Reason for Discharge:  Patient reached a stable level of exercise. Patient independent in their exercise. Patient has met program and personal goals.  Smoking History:  Social History   Tobacco Use  Smoking Status Never Smoker  Smokeless Tobacco Never Used    Diagnosis:  Status post coronary artery stent placement  Initial Exercise Prescription:   Discharge Exercise Prescription (Final Exercise Prescription Changes): Exercise Prescription Changes - 09/23/19 1100      Response to Exercise   Blood Pressure (Admit)  126/74    Blood Pressure (Exercise)  158/74    Blood Pressure (Exit)  138/76    Heart Rate (Admit)  69 bpm    Heart Rate (Exercise)  95 bpm    Heart Rate (Exit)  78 bpm    Rating of Perceived Exertion (Exercise)  12    Symptoms  none    Duration  Continue with 30 min of aerobic exercise without signs/symptoms of physical distress.    Intensity  THRR unchanged      Progression   Progression  Continue to progress workloads to maintain intensity without signs/symptoms of physical distress.    Average METs  3.18      Resistance Training   Training Prescription  Yes    Weight  10 lbs    Reps  10-15      Interval Training   Interval Training  No      Treadmill   MPH  3.2    Grade  2.5    Minutes  15    METs  4.55      NuStep   Level  5    Minutes  15    METs  3.2      Recumbant Elliptical   Level  3    Minutes  15    METs  2      Elliptical   Level  2    Speed  4.1    Minutes  15      Home Exercise Plan   Plans to continue exercise at  Home (comment)   walking, biking   Frequency  Add 2 additional days to program exercise sessions.    Initial Home Exercises Provided  03/21/19       Functional Capacity: 6 Minute Walk    Row Name 09/11/19 1106          6 Minute Walk   Phase  Discharge     Distance  1530 feet     Distance % Change  27.5 %     Distance Feet Change  330 ft     Walk Time  6 minutes     # of Rest Breaks  0     MPH  2.9     METS  2.65     RPE  12     VO2 Peak  9.27     Symptoms  No     Resting HR  69 bpm     Resting BP  126/74     Max Ex. HR  95 bpm     Max Ex. BP  158/76        Quality of Life: Quality of Life - 09/24/19 1521      Quality of Life   Select  Quality of Life  Quality of Life Scores   Health/Function Pre  25.27 %    Health/Function Post  27.27 %    Health/Function % Change  7.91 %    Socioeconomic Pre  30 %    Socioeconomic Post  29.64 %    Socioeconomic % Change   -1.2 %    Psych/Spiritual Pre  25.79 %    Psych/Spiritual Post  27.43 %    Psych/Spiritual % Change  6.36 %    Family Pre  25.6 %    Family Post  30 %    Family % Change  17.19 %    GLOBAL Pre  26.4 %    GLOBAL Post  28.19 %    GLOBAL % Change  6.78 %       Personal Goals: Goals established at orientation with interventions provided to work toward goal.    Personal Goals Discharge: Goals and Risk Factor Review - 08/27/19 1410      Core Components/Risk Factors/Patient Goals Review   Personal Goals Review  Weight Management/Obesity;Lipids;Hypertension    Review  Jesse Norton is doing well in rehab.  He got good reports at both his cardiologist and primary.  He is working on weight loss still.  Starting next week, he will reduce some calories to try to get a jump start. He is doing well with his pressures and continues to check them at home.    Expected Outcomes  Short: Continue to work on weight loss.  Long: Continue to monitor risk factors.       Exercise Goals and Review:   Exercise Goals Re-Evaluation: Exercise Goals Re-Evaluation    Row Name 07/02/19 1414 07/14/19 0959 07/17/19 1534 07/30/19 1418 08/01/19 1115     Exercise Goal Re-Evaluation   Exercise Goals Review  Increase Physical Activity;Able to  understand and use rate of perceived exertion (RPE) scale;Knowledge and understanding of Target Heart Rate Range (THRR);Understanding of Exercise Prescription;Increase Strength and Stamina;Able to check pulse independently  Increase Physical Activity;Increase Strength and Stamina;Understanding of Exercise Prescription  Increase Physical Activity  Increase Physical Activity;Increase Strength and Stamina;Able to understand and use rate of perceived exertion (RPE) scale;Knowledge and understanding of Target Heart Rate Range (THRR);Able to check pulse independently;Understanding of Exercise Prescription  Increase Physical Activity;Increase Strength and Stamina;Able to understand and use rate of perceived exertion (RPE) scale;Knowledge and understanding of Target Heart Rate Range (THRR);Able to check pulse independently;Understanding of Exercise Prescription   Comments  Reviewed RPE scale, THR and program prescription with pt today.  Pt voiced understanding and was given a copy of goals to take home.  Jesse Norton is off to a good start. He has even started on the elliptical and up to 3 METs on the NuStep. We will continue to monitor his progress.  Jesse Norton talked about his home exercise routine today.  walking alternate days, biking 10 miles or rollers, cardio. USes stationary bike .  6 days a week routine  Jesse Norton continues his home exercise.  He is checking HR etc and maintains consistent RPM on bike.  Jesse Norton is progressing well in HT.  He is up to 10 lb weights and has increased levels on all machines   Expected Outcomes  Short: Use RPE daily to regulate intensity. Long: Follow program prescription in THR.  Short: Continue to increase workloads.  Long: Continue to increase strength and stamina.  Short: Continue to increase workloads.  Long: Continue to increase strength and stamina.  -  Short - continue to attend consistently Long -  increase overall MET level   Row Name 08/12/19 1551 08/27/19 1409 08/29/19 0753  09/09/19 1425 09/23/19 1136     Exercise Goal Re-Evaluation   Exercise Goals Review  Increase Physical Activity;Increase Strength and Stamina;Able to understand and use rate of perceived exertion (RPE) scale;Able to understand and use Dyspnea scale;Knowledge and understanding of Target Heart Rate Range (THRR);Able to check pulse independently;Understanding of Exercise Prescription  Increase Physical Activity;Increase Strength and Stamina;Understanding of Exercise Prescription  Increase Physical Activity;Increase Strength and Stamina;Able to understand and use rate of perceived exertion (RPE) scale;Knowledge and understanding of Target Heart Rate Range (THRR);Able to check pulse independently;Understanding of Exercise Prescription  Increase Physical Activity;Increase Strength and Stamina;Understanding of Exercise Prescription  Increase Physical Activity;Increase Strength and Stamina;Understanding of Exercise Prescription   Comments  Damauri is up to 4.3 METS on TM.  Staff will monitor progress  Donavan is doing well in rehab.  He is exercising at home consistently.  Friday hikes, Saturday hike and bike, Sunday stretch and balance, Monday bike.  He feels like his strength and stamina are doing well.  Alessandro is very consistent with his exercise at home and attendance at rehab.  He is meeting program goals.  Richared continues to do well in rehab.  He is hiking again and doing well.  He is doing well on the elliptical and feeling stronger.  He is up to 2.5% on the treadmill!  We will continue to monitor his progress.  Mamadou will be graduating soon!  He improved his post 6MWT by more than 27%!  He is planning to continue to exercise by hiking and walking.   Expected Outcomes  Short - consider interval training Long - maintain increase in MET level  Short: Continue to add in intervals.  Long: continue to exericse on own.  Short - continue intervals Long - maintain current fitness level  Short: Contnue to add in  intervlas even on upright equipment. Long: Continue to improve stamina.  Short: Graduate!!  Long: Continue exercise independently.      Nutrition & Weight - Outcomes:  Post Biometrics - 09/11/19 1107       Post  Biometrics   Height  5' 8.9" (1.75 m)    Weight  255 lb 11.2 oz (116 kg)    BMI (Calculated)  37.87    Single Leg Stand  30 seconds       Nutrition: Nutrition Therapy & Goals - 07/07/19 1510      Nutrition Therapy   Diet  Low Na HH diet    Drug/Food Interactions  Statins/Certain Fruits   Lipitor   Protein (specify units)  95g    Fiber  30 grams    Whole Grain Foods  3 servings    Saturated Fats  12 max. grams    Fruits and Vegetables  5 servings/day    Sodium  1.5 grams      Personal Nutrition Goals   Nutrition Goal  ST: navigate HH eating LT: get back into shape    Comments  Pt is on Lasixs (checks his legs every morning). Pt reports having good days and bad days. Pt wants help with his trip food list (pt provided - will review and disucss). Pt reports trying to choose lean meats and low sodium options as well as vegetables. Pt asked about asian cuisine and sodium intake; discussed that he could choose low sodium options and make some things himself, but in terms of sauce the biggest impact would be managing quantity.  Discussed HH diet and low Na. Pt food list: B: eggs, bacon, coffee, juice. Oatmeal, prunes, brown sugar, coffee. cold cereal, fruit cup, powdered milk, coffee (granola, cheerios). pancakes, blueberry syrup, coffee. L: canned salmon or canned chicken and cheese wrap, chips, candy bar. Instant soup, nabs (peanut butter or cheese crackers), summer sausage, cookies. Cold cuts, apple, pickles, rolls. Canned tuna or smoked oysters, nabs, cookies, mayo and pickles. D: country ham, pinto beans, cornbread, pudding. steak, instant potatoes, canned peas, cookies. spam/canned chicken, rice, string beans, blueberry or apple muffins, pineapple chunks. canned lasanga/  spaghetti, cheese, pregos sauce, muffins, pudding.[place] stew, muffins.      Intervention Plan   Intervention  Prescribe, educate and counsel regarding individualized specific dietary modifications aiming towards targeted core components such as weight, hypertension, lipid management, diabetes, heart failure and other comorbidities.;Nutrition handout(s) given to patient.    Expected Outcomes  Short Term Goal: Understand basic principles of dietary content, such as calories, fat, sodium, cholesterol and nutrients.;Short Term Goal: A plan has been developed with personal nutrition goals set during dietitian appointment.;Long Term Goal: Adherence to prescribed nutrition plan.       Nutrition Discharge: Nutrition Assessments - 09/24/19 1521      MEDFICTS Scores   Pre Score  78    Post Score  24    Score Difference  -54       Education Questionnaire Score: Knowledge Questionnaire Score - 09/24/19 1521      Knowledge Questionnaire Score   Pre Score  24/26    Post Score  26/26       Goals reviewed with patient; copy given to patient.

## 2019-09-30 ENCOUNTER — Encounter: Payer: Medicare Other | Admitting: *Deleted

## 2019-09-30 ENCOUNTER — Other Ambulatory Visit: Payer: Self-pay

## 2019-09-30 DIAGNOSIS — Z955 Presence of coronary angioplasty implant and graft: Secondary | ICD-10-CM

## 2019-09-30 NOTE — Progress Notes (Signed)
Cardiac Individual Treatment Plan  Patient Details  Name: Jesse Norton MRN: [id number] Date of Birth: 1946-02-25 Referring Provider:     Cardiac Rehab from 02/13/2019 in [facility] Cardiac and Pulmonary Rehab  Referring Provider  End      Initial Encounter Date:    Cardiac Rehab from 02/13/2019 in [facility] Cardiac and Pulmonary Rehab  Date  02/13/19      Visit Diagnosis: Status post coronary artery stent placement  Patient's Home Medications on Admission:  Current Outpatient Medications:  .  allopurinol (ZYLOPRIM) 300 MG tablet, Take 1 tablet (300 mg total) by mouth daily., Disp: 90 tablet, Rfl: 4 .  amLODipine (NORVASC) 10 MG tablet, Take 1 tablet (10 mg total) by mouth daily., Disp: 90 tablet, Rfl: 4 .  aspirin EC 81 MG tablet, Take 81 mg by mouth daily., Disp: , Rfl:  .  atorvastatin (LIPITOR) 40 MG tablet, Take 1 tablet (40 mg total) by mouth daily., Disp: 90 tablet, Rfl: 4 .  clopidogrel (PLAVIX) 75 MG tablet, Take 1 tablet (75 mg total) by mouth daily with breakfast., Disp: 90 tablet, Rfl: 3 .  FLUoxetine (PROZAC) 20 MG capsule, Take 1 capsule (20 mg total) by mouth daily., Disp: 90 capsule, Rfl: 4 .  furosemide (LASIX) 20 MG tablet, Take 1 tablet (20 mg total) by mouth daily for 30 days. (Patient taking differently: Take 20 mg by mouth daily. Daily), Disp: 90 tablet, Rfl: 3 .  LORazepam (ATIVAN) 1 MG tablet, Take 0.5-1 tablets (0.5-1 mg total) by mouth daily as needed for anxiety., Disp: 30 tablet, Rfl: 1 .  losartan (COZAAR) 100 MG tablet, Take 1 tablet (100 mg total) by mouth daily., Disp: 90 tablet, Rfl: 3 .  metoprolol tartrate (LOPRESSOR) 25 MG tablet, Take 1 tablet (25 mg total) by mouth 2 (two) times daily., Disp: 180 tablet, Rfl: 3 .  Multiple Vitamin (MULTIVITAMIN) tablet, Take 1 tablet by mouth daily., Disp: , Rfl:  .  Multiple Vitamins-Minerals (EMERGEN-C IMMUNE PO), Take 1 tablet by mouth every other day. , Disp: , Rfl:  .  pantoprazole (PROTONIX) 20 MG tablet, Take 1 tablet  (20 mg total) by mouth 2 (two) times daily., Disp: 180 tablet, Rfl: 4  Past Medical History: Past Medical History:  Diagnosis Date  . (HFpEF) heart failure with preserved ejection fraction ([place])    a. 01/2019 Cath: EF 55-60% w/ elevated LVEDP.  Marland Kitchen Anxiety   . Barrett's esophagus   . CAD (coronary artery disease)    a. 10/2018 ETT: 5m horizontal inflat ST depression, freq PVC's->Intermediate; b. 12/2018 Cardiac CTA: LM nl, LAD FFRct 0.6 in mLAD, LCX FFRct 0.99p, 0.815m0.77d, RCA no signif dzs, FFRct 0.89d->Rec cath; c. 01/2019 PCI: LM nl, LAD 40p, 8571m2 70, LCX 70p (2.75x15 Resolute Onyx DES), RCA min irregs. EF 55-65%. EDP 25-31m51m d. 01/2019 PCI: LAD 80-90 (atherectomy & 3x15 Resolute Onyx DES).  . Colon polyps   . Duodenal adenoma   . Gastritis   . GERD (gastroesophageal reflux disease)   . Gout   . H. pylori infection   . H/O urethral stricture   . Hemorrhoids   . Hiatal hernia   . Hypertension   . Stomach ulcer     Tobacco Use: Social History   Tobacco Use  Smoking Status Never Smoker  Smokeless Tobacco Never Used    Labs: Recent Review Flowsheet Data    Labs for ITP Cardiac and Pulmonary Rehab Latest Ref Rng & Units 05/30/2016 06/07/2017 06/26/2018 02/12/2019 08/19/2019  Cholestrol 100 - 199 mg/dL 181 166 169 124 151   LDLCALC 0 - 99 mg/dL 107(H) 85 98 - 55   HDL >39 mg/dL 62 56 59 - 82   Trlycerides 0 - 149 mg/dL 61 127 62 58 72       Exercise Target Goals: Exercise Program Goal: Individual exercise prescription set using results from initial 6 min walk test and THRR while considering  patient's activity barriers and safety.   Exercise Prescription Goal: Initial exercise prescription builds to 30-45 minutes a day of aerobic activity, 2-3 days per week.  Home exercise guidelines will be given to patient during program as part of exercise prescription that the participant will acknowledge.  Activity Barriers & Risk Stratification:   6 Minute Walk: 6 Minute Walk     Row Name 09/11/19 1106         6 Minute Walk   Phase  Discharge     Distance  1530 feet     Distance % Change  27.5 %     Distance Feet Change  330 ft     Walk Time  6 minutes     # of Rest Breaks  0     MPH  2.9     METS  2.65     RPE  12     VO2 Peak  9.27     Symptoms  No     Resting HR  69 bpm     Resting BP  126/74     Max Ex. HR  95 bpm     Max Ex. BP  158/76        Oxygen Initial Assessment:   Oxygen Re-Evaluation:   Oxygen Discharge (Final Oxygen Re-Evaluation):   Initial Exercise Prescription:   Perform Capillary Blood Glucose checks as needed.  Exercise Prescription Changes: Exercise Prescription Changes    Row Name 07/14/19 1000 08/01/19 1100 08/12/19 1500 08/29/19 0700 09/09/19 1400     Response to Exercise   Blood Pressure (Admit)  136/80  130/78  118/60  124/70  144/84   Blood Pressure (Exercise)  144/60  160/80  -  152/60  140/64   Blood Pressure (Exit)  138/78  122/78  138/78  138/78  122/68   Heart Rate (Admit)  63 bpm  72 bpm  70 bpm  63 bpm  74 bpm   Heart Rate (Exercise)  122 bpm  115 bpm  115 bpm  97 bpm  114 bpm   Heart Rate (Exit)  92 bpm  75 bpm  84 bpm  78 bpm  81 bpm   Rating of Perceived Exertion (Exercise)  '13  13  12  12  13   '$ Symptoms  none  none  none  none  none   Duration  Continue with 30 min of aerobic exercise without signs/symptoms of physical distress.  Continue with 30 min of aerobic exercise without signs/symptoms of physical distress.  Continue with 30 min of aerobic exercise without signs/symptoms of physical distress.  Continue with 30 min of aerobic exercise without signs/symptoms of physical distress.  Continue with 30 min of aerobic exercise without signs/symptoms of physical distress.   Intensity  THRR unchanged  THRR unchanged  THRR unchanged  THRR unchanged  THRR unchanged     Progression   Progression  Continue to progress workloads to maintain intensity without signs/symptoms of physical distress.  Continue to  progress workloads to maintain intensity without signs/symptoms of physical distress.  Continue to  progress workloads to maintain intensity without signs/symptoms of physical distress.  Continue to progress workloads to maintain intensity without signs/symptoms of physical distress.  Continue to progress workloads to maintain intensity without signs/symptoms of physical distress.   Average METs  3.04  3.7  4.3  3.8  3.22     Resistance Training   Training Prescription  Yes  Yes  Yes  Yes  Yes   Weight  4 lbs  10 lb  10 lb  10 lb  10 lbs   Reps  10-15  10-15  10-15  10-15  10-15     Interval Training   Interval Training  No  No  No  No  No     Treadmill   MPH  2.3  2.6  3.2  2.9  3.2   Grade  0.'5  2  2  '$ 3.5  2.5   Minutes  '15  15  15  15  15   '$ METs  2.92  3.71  4.33  4.33  4.55     NuStep   Level  3  4  -  5  5   Minutes  15  15  -  15  15   METs  4.3  2.7  -  3.2  3.2     Recumbant Elliptical   Level  1  3  -  3  3   Minutes  15  15  -  15  15   METs  1.9  2.4  -  4.2  2     Elliptical   Level  '1  2  3  '$ -  2   Speed  4.1  -  -  -  4.1   Minutes  '15  15  15  '$ -  15     Home Exercise Plan   Plans to continue exercise at  Home (comment) walking, biking  Home (comment) walking, biking  Home (comment) walking, biking  Home (comment) walking, biking  Home (comment) walking, biking   Frequency  Add 2 additional days to program exercise sessions.  Add 2 additional days to program exercise sessions.  Add 2 additional days to program exercise sessions.  Add 2 additional days to program exercise sessions.  Add 2 additional days to program exercise sessions.   Initial Home Exercises Provided  03/21/19  03/21/19  03/21/19  03/21/19  03/21/19   Row Name 09/23/19 1100             Response to Exercise   Blood Pressure (Admit)  126/74       Blood Pressure (Exercise)  158/74       Blood Pressure (Exit)  138/76       Heart Rate (Admit)  69 bpm       Heart Rate (Exercise)  95 bpm        Heart Rate (Exit)  78 bpm       Rating of Perceived Exertion (Exercise)  12       Symptoms  none       Duration  Continue with 30 min of aerobic exercise without signs/symptoms of physical distress.       Intensity  THRR unchanged         Progression   Progression  Continue to progress workloads to maintain intensity without signs/symptoms of physical distress.       Average METs  3.18         Resistance Training  Training Prescription  Yes       Weight  10 lbs       Reps  10-15         Interval Training   Interval Training  No         Treadmill   MPH  3.2       Grade  2.5       Minutes  15       METs  4.55         NuStep   Level  5       Minutes  15       METs  3.2         Recumbant Elliptical   Level  3       Minutes  15       METs  2         Elliptical   Level  2       Speed  4.1       Minutes  15         Home Exercise Plan   Plans to continue exercise at  Home (comment) walking, biking       Frequency  Add 2 additional days to program exercise sessions.       Initial Home Exercises Provided  03/21/19          Exercise Comments: Exercise Comments    Row Name 09/30/19 1009           Exercise Comments  Jesse Norton graduated today from  rehab with36 sessions completed.  Details of the patient's exercise prescription and what He needs to do in order to continue the prescription and progress were discussed with patient.  Patient was given a copy of prescription and goals.  Patient verbalized understanding.  Jesse Norton plans to continue to exercise by using stationary bike at home and he goes hiking.          Exercise Goals and Review:   Exercise Goals Re-Evaluation : Exercise Goals Re-Evaluation    Row Name 07/02/19 1414 07/14/19 0959 07/17/19 1534 07/30/19 1418 08/01/19 1115     Exercise Goal Re-Evaluation   Exercise Goals Review  Increase Physical Activity;Able to understand and use rate of perceived exertion (RPE) scale;Knowledge and understanding of Target  Heart Rate Range (THRR);Understanding of Exercise Prescription;Increase Strength and Stamina;Able to check pulse independently  Increase Physical Activity;Increase Strength and Stamina;Understanding of Exercise Prescription  Increase Physical Activity  Increase Physical Activity;Increase Strength and Stamina;Able to understand and use rate of perceived exertion (RPE) scale;Knowledge and understanding of Target Heart Rate Range (THRR);Able to check pulse independently;Understanding of Exercise Prescription  Increase Physical Activity;Increase Strength and Stamina;Able to understand and use rate of perceived exertion (RPE) scale;Knowledge and understanding of Target Heart Rate Range (THRR);Able to check pulse independently;Understanding of Exercise Prescription   Comments  Reviewed RPE scale, THR and program prescription with pt today.  Pt voiced understanding and was given a copy of goals to take home.  Jesse Norton is off to a good start. He has even started on the elliptical and up to 3 METs on the NuStep. We will continue to monitor his progress.  Jesse Norton talked about his home exercise routine today.  walking alternate days, biking 10 miles or rollers, cardio. USes stationary bike .  6 days a week routine  Jesse Norton continues his home exercise.  He is checking HR etc and maintains consistent RPM on bike.  Jesse Norton is progressing well in HT.  He  is up to 10 lb weights and has increased levels on all machines   Expected Outcomes  Short: Use RPE daily to regulate intensity. Long: Follow program prescription in THR.  Short: Continue to increase workloads.  Long: Continue to increase strength and stamina.  Short: Continue to increase workloads.  Long: Continue to increase strength and stamina.  -  Short - continue to attend consistently Long - increase overall MET level   Row Name 08/12/19 1551 08/27/19 1409 08/29/19 0753 09/09/19 1425 09/23/19 1136     Exercise Goal Re-Evaluation   Exercise Goals Review  Increase  Physical Activity;Increase Strength and Stamina;Able to understand and use rate of perceived exertion (RPE) scale;Able to understand and use Dyspnea scale;Knowledge and understanding of Target Heart Rate Range (THRR);Able to check pulse independently;Understanding of Exercise Prescription  Increase Physical Activity;Increase Strength and Stamina;Understanding of Exercise Prescription  Increase Physical Activity;Increase Strength and Stamina;Able to understand and use rate of perceived exertion (RPE) scale;Knowledge and understanding of Target Heart Rate Range (THRR);Able to check pulse independently;Understanding of Exercise Prescription  Increase Physical Activity;Increase Strength and Stamina;Understanding of Exercise Prescription  Increase Physical Activity;Increase Strength and Stamina;Understanding of Exercise Prescription   Comments  Jesse Norton is up to 4.3 METS on TM.  Staff will monitor progress  Jesse Norton is doing well in rehab.  He is exercising at home consistently.  Friday hikes, Saturday hike and bike, Sunday stretch and balance, Monday bike.  He feels like his strength and stamina are doing well.  Jesse Norton is very consistent with his exercise at home and attendance at rehab.  He is meeting program goals.  Jesse Norton continues to do well in rehab.  He is hiking again and doing well.  He is doing well on the elliptical and feeling stronger.  He is up to 2.5% on the treadmill!  We will continue to monitor his progress.  Jesse Norton will be graduating soon!  He improved his post 6MWT by more than 27%!  He is planning to continue to exercise by hiking and walking.   Expected Outcomes  Short - consider interval training Long - maintain increase in MET level  Short: Continue to add in intervals.  Long: continue to exericse on own.  Short - continue intervals Long - maintain current fitness level  Short: Contnue to add in intervlas even on upright equipment. Long: Continue to improve stamina.  Short: Graduate!!  Long:  Continue exercise independently.      Discharge Exercise Prescription (Final Exercise Prescription Changes): Exercise Prescription Changes - 09/23/19 1100      Response to Exercise   Blood Pressure (Admit)  126/74    Blood Pressure (Exercise)  158/74    Blood Pressure (Exit)  138/76    Heart Rate (Admit)  69 bpm    Heart Rate (Exercise)  95 bpm    Heart Rate (Exit)  78 bpm    Rating of Perceived Exertion (Exercise)  12    Symptoms  none    Duration  Continue with 30 min of aerobic exercise without signs/symptoms of physical distress.    Intensity  THRR unchanged      Progression   Progression  Continue to progress workloads to maintain intensity without signs/symptoms of physical distress.    Average METs  3.18      Resistance Training   Training Prescription  Yes    Weight  10 lbs    Reps  10-15      Interval Training   Interval Training  No  Treadmill   MPH  3.2    Grade  2.5    Minutes  15    METs  4.55      NuStep   Level  5    Minutes  15    METs  3.2      Recumbant Elliptical   Level  3    Minutes  15    METs  2      Elliptical   Level  2    Speed  4.1    Minutes  15      Home Exercise Plan   Plans to continue exercise at  Home (comment)   walking, biking   Frequency  Add 2 additional days to program exercise sessions.    Initial Home Exercises Provided  03/21/19       Nutrition:  Target Goals: Understanding of nutrition guidelines, daily intake of sodium '1500mg'$ , cholesterol '200mg'$ , calories 30% from fat and 7% or less from saturated fats, daily to have 5 or more servings of fruits and vegetables.  Biometrics:  Post Biometrics - 09/11/19 1107       Post  Biometrics   Height  5' 8.9" (1.75 m)    Weight  255 lb 11.2 oz (116 kg)    BMI (Calculated)  37.87    Single Leg Stand  30 seconds       Nutrition Therapy Plan and Nutrition Goals: Nutrition Therapy & Goals - 07/07/19 1510      Nutrition Therapy   Diet  Low Na HH diet     Drug/Food Interactions  Statins/Certain Fruits   Lipitor   Protein (specify units)  95g    Fiber  30 grams    Whole Grain Foods  3 servings    Saturated Fats  12 max. grams    Fruits and Vegetables  5 servings/day    Sodium  1.5 grams      Personal Nutrition Goals   Nutrition Goal  ST: navigate HH eating LT: get back into shape    Comments  Pt is on Lasixs (checks his legs every morning). Pt reports having good days and bad days. Pt wants help with his trip food list (pt provided - will review and disucss). Pt reports trying to choose lean meats and low sodium options as well as vegetables. Pt asked about asian cuisine and sodium intake; discussed that he could choose low sodium options and make some things himself, but in terms of sauce the biggest impact would be managing quantity. Discussed HH diet and low Na. Pt food list: B: eggs, bacon, coffee, juice. Oatmeal, prunes, brown sugar, coffee. cold cereal, fruit cup, powdered milk, coffee (granola, cheerios). pancakes, blueberry syrup, coffee. L: canned salmon or canned chicken and cheese wrap, chips, candy bar. Instant soup, nabs (peanut butter or cheese crackers), summer sausage, cookies. Cold cuts, apple, pickles, rolls. Canned tuna or smoked oysters, nabs, cookies, mayo and pickles. D: country ham, pinto beans, cornbread, pudding. steak, instant potatoes, canned peas, cookies. spam/canned chicken, rice, string beans, blueberry or apple muffins, pineapple chunks. canned lasanga/ spaghetti, cheese, pregos sauce, muffins, pudding.[place] stew, muffins.      Intervention Plan   Intervention  Prescribe, educate and counsel regarding individualized specific dietary modifications aiming towards targeted core components such as weight, hypertension, lipid management, diabetes, heart failure and other comorbidities.;Nutrition handout(s) given to patient.    Expected Outcomes  Short Term Goal: Understand basic principles of dietary content, such as  calories, fat, sodium, cholesterol  and nutrients.;Short Term Goal: A plan has been developed with personal nutrition goals set during dietitian appointment.;Long Term Goal: Adherence to prescribed nutrition plan.       Nutrition Assessments: Nutrition Assessments - 09/24/19 1521      MEDFICTS Scores   Pre Score  78    Post Score  24    Score Difference  -54       Nutrition Goals Re-Evaluation: Nutrition Goals Re-Evaluation    [place] Name 07/24/19 1024 08/27/19 1424 09/24/19 1419         Goals   Current Weight  -  256 lb 11.2 oz (116.4 kg)  256 lb 8 oz (116.3 kg)     Nutrition Goal  ST: navigate HH eating LT: get back into shape  ST: continue HH eating LT: get back into shape  ST: continue HH eating LT: get back into shape     Comment  Gave pt canoe trip meal tips and 2 sample meals for B, L, and D. Disscussed what he could do to make his trips healthier, pt asked about his spam, told him since he only has it once per year on these trips, it's not very concerning, but there really aren't equal replacements for that. Disscussed why spam may not be a desirable choice (Na content, processed meat)  berries + oatmeal/yogurt/smoothie. L: salad w/tuna or fruit, or [place] sandwich w/mustard whole wheat. D: work in progress, chicken, salmon, tuna, [place] (grill - cut back on red meat) added lots of frozen veggies evoo and mrs dash. Pt reports he likes what he is eating enough, but reports that he feels healthy food doesn't taste as good as less healthy food, encouraged pt and told him some ways he could enhance the flavors and if he ever wanted any help or resources to help with this. Pt reports having an elixer (green apple, ceyeen, tumeric, black pepper, water) discussed how he could get those benefits from eating those foods.  Pt had some questions regarding olive oil, organ meat, red meat, etc - answered all questions. Pt reports wanting to move more to plant based foods, discussed how to incude more  protein and emphasized variety. Pt ready for discharge.     Expected Outcome  ST: navigate HH eating LT: get back into shape  ST: navigate HH eating LT: get back into shape  ST: navigate HH eating LT: get back into shape        Nutrition Goals Discharge (Final Nutrition Goals Re-Evaluation): Nutrition Goals Re-Evaluation - 09/24/19 1419      Goals   Current Weight  256 lb 8 oz (116.3 kg)    Nutrition Goal  ST: continue HH eating LT: get back into shape    Comment  Pt had some questions regarding olive oil, organ meat, red meat, etc - answered all questions. Pt reports wanting to move more to plant based foods, discussed how to incude more protein and emphasized variety. Pt ready for discharge.    Expected Outcome  ST: navigate HH eating LT: get back into shape       Psychosocial: Target Goals: Acknowledge presence or absence of significant depression and/or stress, maximize coping skills, provide positive support system. Participant is able to verbalize types and ability to use techniques and skills needed for reducing stress and depression.   Initial Review & Psychosocial Screening:   Quality of Life Scores:  Quality of Life - 09/24/19 1521      Quality of Life   Select  Quality of Life      Quality of Life Scores   Health/Function Pre  25.27 %    Health/Function Post  27.27 %    Health/Function % Change  7.91 %    Socioeconomic Pre  30 %    Socioeconomic Post  29.64 %    Socioeconomic % Change   -1.2 %    Psych/Spiritual Pre  25.79 %    Psych/Spiritual Post  27.43 %    Psych/Spiritual % Change  6.36 %    Family Pre  25.6 %    Family Post  30 %    Family % Change  17.19 %    GLOBAL Pre  26.4 %    GLOBAL Post  28.19 %    GLOBAL % Change  6.78 %      Scores of 19 and below usually indicate a poorer quality of life in these areas.  A difference of  2-3 points is a clinically meaningful difference.  A difference of 2-3 points in the total score of the Quality of Life Index  has been associated with significant improvement in overall quality of life, self-image, physical symptoms, and general health in studies assessing change in quality of life.  PHQ-9: Recent Review Flowsheet Data    Depression screen [facility] 2/9 09/24/2019 08/19/2019 06/04/2019 02/13/2019 02/12/2019   Decreased Interest 0 0 0 0 0   Down, Depressed, Hopeless 0 0 1 0 0   PHQ - 2 Score 0 0 1 0 0   Altered sleeping 0 0 - 1 1   Tired, decreased energy 0 0 - 0 0   Change in appetite 0 0 - 0 0   Feeling bad or failure about yourself  0 0 - 0 0   Trouble concentrating 0 0 - 0 0   Moving slowly or fidgety/restless 0 0 - 0 0   Suicidal thoughts 0 0 - 0 0   PHQ-9 Score 0 0 - 1 1   Difficult doing work/chores Not difficult at all Not difficult at all - Not difficult at all Not difficult at all     Interpretation of Total Score  Total Score Depression Severity:  1-4 = Minimal depression, 5-9 = Mild depression, 10-14 = Moderate depression, 15-19 = Moderately severe depression, 20-27 = Severe depression   Psychosocial Evaluation and Intervention: Psychosocial Evaluation - 09/24/19 1414      Discharge Psychosocial Assessment & Intervention   Comments  Jesse Norton has really enjoyed coming to [facility]! He is planning on continuing to exercise independently and states he learned a lot from our program. He feels more equiped in caring for himself mentally and physically.       Psychosocial Re-Evaluation: Psychosocial Re-Evaluation    Row Name 07/10/19 1429 07/30/19 1421 08/27/19 1412         Psychosocial Re-Evaluation   Current issues with  Current Psychotropic Meds;Current Anxiety/Panic;Current Stress Concerns  Current Stress Concerns  Current Stress Concerns;Current Sleep Concerns     Comments  His son has been making some life decisions that has been attributing to his stress. Informed patient that it is not his fault and sometimes those issues are out of his control. He is taking porozac and has been  helping with his anxiety. Spoke to patient about continuing exercise to help reduce stress. He has a great attitude and wants to me able to do more after rehab.  Jesse Norton has been meditatin at night before bed using youtube realxing music and self  guided relaxation.  He often goes to sleep during this time and states "it really works to reduce stress."  Jesse Norton is doing well mentally.  He denies symptoms of depression and anxiety.  He still is sleeping well with using his mediation music and able to do go to sleep quickly.  He would like to get more sleep as the dogs get him up early. He is trying to aim for 6-8 hours of sleep.  He has really enjoyed getting to know the staff and the postivity that we provide.     Expected Outcomes  Short: continue medications and exercise. Long: reduce stressors to help his overall well being.  Short - continue exercise and meditation Long - manage stress independently  Short: Continue to exercise and use mediation.  Long: Continue to stay positive.     Interventions  Encouraged to attend Cardiac Rehabilitation for the exercise  -  Encouraged to attend Cardiac Rehabilitation for the exercise     Continue Psychosocial Services   -  -  Follow up required by staff        Psychosocial Discharge (Final Psychosocial Re-Evaluation): Psychosocial Re-Evaluation - 08/27/19 1412      Psychosocial Re-Evaluation   Current issues with  Current Stress Concerns;Current Sleep Concerns    Comments  Jesse Norton is doing well mentally.  He denies symptoms of depression and anxiety.  He still is sleeping well with using his mediation music and able to do go to sleep quickly.  He would like to get more sleep as the dogs get him up early. He is trying to aim for 6-8 hours of sleep.  He has really enjoyed getting to know the staff and the postivity that we provide.    Expected Outcomes  Short: Continue to exercise and use mediation.  Long: Continue to stay positive.    Interventions  Encouraged  to attend Cardiac Rehabilitation for the exercise    Continue Psychosocial Services   Follow up required by staff       Vocational Rehabilitation: Provide vocational rehab assistance to qualifying candidates.   Vocational Rehab Evaluation & Intervention:   Education: Education Goals: Education classes will be provided on a variety of topics geared toward better understanding of heart health and risk factor modification. Participant will state understanding/return demonstration of topics presented as noted by education test scores.  Learning Barriers/Preferences:   Education Topics:  AED/CPR: - Group verbal and written instruction with the use of models to demonstrate the basic use of the AED with the basic ABC's of resuscitation.   General Nutrition Guidelines/Fats and Fiber: -Group instruction provided by verbal, written material, models and posters to present the general guidelines for heart healthy nutrition. Gives an explanation and review of dietary fats and fiber.   Controlling Sodium/Reading Food Labels: -Group verbal and written material supporting the discussion of sodium use in heart healthy nutrition. Review and explanation with models, verbal and written materials for utilization of the food label.   Exercise Physiology & General Exercise Guidelines: - Group verbal and written instruction with models to review the exercise physiology of the cardiovascular system and associated critical values. Provides general exercise guidelines with specific guidelines to those with heart or lung disease.    Aerobic Exercise & Resistance Training: - Gives group verbal and written instruction on the various components of exercise. Focuses on aerobic and resistive training programs and the benefits of this training and how to safely progress through these programs..   Flexibility, Balance, Mind/Body  Relaxation: Provides group verbal/written instruction on the benefits of  flexibility and balance training, including mind/body exercise modes such as yoga, pilates and tai chi.  Demonstration and skill practice provided.   Stress and Anxiety: - Provides group verbal and written instruction about the health risks of elevated stress and causes of high stress.  Discuss the correlation between heart/lung disease and anxiety and treatment options. Review healthy ways to manage with stress and anxiety.   Depression: - Provides group verbal and written instruction on the correlation between heart/lung disease and depressed mood, treatment options, and the stigmas associated with seeking treatment.   Anatomy & Physiology of the Heart: - Group verbal and written instruction and models provide basic cardiac anatomy and physiology, with the coronary electrical and arterial systems. Review of Valvular disease and Heart Failure   Cardiac Procedures: - Group verbal and written instruction to review commonly prescribed medications for heart disease. Reviews the medication, class of the drug, and side effects. Includes the steps to properly store meds and maintain the prescription regimen. (beta blockers and nitrates)   Cardiac Medications I: - Group verbal and written instruction to review commonly prescribed medications for heart disease. Reviews the medication, class of the drug, and side effects. Includes the steps to properly store meds and maintain the prescription regimen.   Cardiac Medications II: -Group verbal and written instruction to review commonly prescribed medications for heart disease. Reviews the medication, class of the drug, and side effects. (all other drug classes)    Go Sex-Intimacy & Heart Disease, Get SMART - Goal Setting: - Group verbal and written instruction through game format to discuss heart disease and the return to sexual intimacy. Provides group verbal and written material to discuss and apply goal setting through the application of the  S.M.A.R.T. Method.   Other Matters of the Heart: - Provides group verbal, written materials and models to describe Stable Angina and Peripheral Artery. Includes description of the disease process and treatment options available to the cardiac patient.   Exercise & Equipment Safety: - Individual verbal instruction and demonstration of equipment use and safety with use of the equipment.   Cardiac Rehab from 02/13/2019 in [facility] Cardiac and Pulmonary Rehab  Date  02/13/19  Educator  [facility]  Instruction Review Code  1- Verbalizes Understanding      Infection Prevention: - Provides verbal and written material to individual with discussion of infection control including proper hand washing and proper equipment cleaning during exercise session.   Cardiac Rehab from 02/13/2019 in [facility] Cardiac and Pulmonary Rehab  Date  02/13/19  Educator  [facility]  Instruction Review Code  1- Verbalizes Understanding      Falls Prevention: - Provides verbal and written material to individual with discussion of falls prevention and safety.   Cardiac Rehab from 02/13/2019 in [facility] Cardiac and Pulmonary Rehab  Date  02/13/19  Educator  [facility]  Instruction Review Code  1- Verbalizes Understanding      Diabetes: - Individual verbal and written instruction to review signs/symptoms of diabetes, desired ranges of glucose level fasting, after meals and with exercise. Acknowledge that pre and post exercise glucose checks will be done for 3 sessions at entry of program.   Know Your Numbers and Risk Factors: -Group verbal and written instruction about important numbers in your health.  Discussion of what are risk factors and how they play a role in the disease process.  Review of Cholesterol, Blood Pressure, Diabetes, and BMI and the role they play  in your overall health.   Sleep Hygiene: -Provides group verbal and written instruction about how sleep can affect your health.  Define sleep hygiene, discuss sleep cycles and impact of  sleep habits. Review good sleep hygiene tips.    Other: -Provides group and verbal instruction on various topics (see comments)   Knowledge Questionnaire Score: Knowledge Questionnaire Score - 09/24/19 1521      Knowledge Questionnaire Score   Pre Score  24/26    Post Score  26/26       Core Components/Risk Factors/Patient Goals at Admission:   Core Components/Risk Factors/Patient Goals Review:  Goals and Risk Factor Review    Row Name 07/10/19 1441 07/30/19 1416 08/27/19 1410         Core Components/Risk Factors/Patient Goals Review   Personal Goals Review  Weight Management/Obesity;Lipids;Hypertension  -  Weight Management/Obesity;Lipids;Hypertension     Review  Patient states he is taking his medications at home and checking his blood pressure. He was checking his blood pressure after lunch and was not resting enough for a true resting blood pressure. Informed patient to take his blood pressure everday at the same time for a week to get a true resting blood pressure.  Jesse Norton is taking meds and BP at home.  he has no other current concerns.  Jesse Norton is doing well in rehab.  He got good reports at both his cardiologist and primary.  He is working on weight loss still.  Starting next week, he will reduce some calories to try to get a jump start. He is doing well with his pressures and continues to check them at home.     Expected Outcomes  Short: check blood pressure daily. Long: maintain a healthy blood pressure.  Short - continue checking BP Long - maintain healthy BP  Short: Continue to work on weight loss.  Long: Continue to monitor risk factors.        Core Components/Risk Factors/Patient Goals at Discharge (Final Review):  Goals and Risk Factor Review - 08/27/19 1410      Core Components/Risk Factors/Patient Goals Review   Personal Goals Review  Weight Management/Obesity;Lipids;Hypertension    Review  Jesse Norton is doing well in rehab.  He got good reports at both his  cardiologist and primary.  He is working on weight loss still.  Starting next week, he will reduce some calories to try to get a jump start. He is doing well with his pressures and continues to check them at home.    Expected Outcomes  Short: Continue to work on weight loss.  Long: Continue to monitor risk factors.       ITP Comments: ITP Comments    Row Name 04/15/19 1047 05/01/19 1604 06/25/19 1334 07/02/19 1415 07/23/19 0602   ITP Comments  Lisa is still exercising at home - up to walking 3 miles at 3 mph.  He is still riding his bike alternate days.  He is keeping a log of his food.  I will email Melissa about nutrition consult.   Markevious is riding his bike at [employer] and walking.  He is monitoring his BP at home at rest and during exercise.  I will send Lenna Sciara his email as they havent been able to touch base via phone.  30 day review cycle restarting  after being closed since March 16 because of  Covid 19 pandemic. Program opened to patients on July 6. Not all have returned. ITP updated and sent to Medical Director for review,changes as  needed and signature  First full day of exercise!  Patient was oriented to gym and equipment including functions, settings, policies, and procedures.  Patient's individual exercise prescription and treatment plan were reviewed.  All starting workloads were established based on the results of the 6 minute walk test done at initial orientation visit.  The plan for exercise progression was also introduced and progression will be customized based on patient's performance and goals.  30 Day Review Completed today. Continue with ITP unless changed by Medical Director review.  New to program   Row Name 08/20/19 0612 09/17/19 1233 09/30/19 1009       ITP Comments  30 Day review. Continue with ITP unless directed changes per Medical Director review.  30 day review completed. ITP sent to Dr. Emily Filbert, Medical Director of Cardiac and Pulmonary Rehab. Continue with ITP  unless changes are made by physician.  Department closed starting 10/2 until further notice by infection prevention and Health at Work teams for [place].  Thaxton graduated today from  [employer] sessions completed.  Details of the patient's exercise prescription and what He needs to do in order to continue the prescription and progress were discussed with patient.  Patient was given a copy of prescription and goals.  Patient verbalized understanding.  Errol plans to continue to exercise by using stationary bike at home and he goes hiking.        Comments: Discharged today

## 2019-09-30 NOTE — Progress Notes (Signed)
Daily Session Note  Patient Details  Name: Jesse Norton MRN: [id number] Date of Birth: 03-04-1946 Referring Provider:     Cardiac Rehab from 02/13/2019 in [facility] Cardiac and Pulmonary Rehab  Referring Provider  End      Encounter Date: 09/30/2019  Check In: Session Check In - 09/30/19 1007      Check-In   Supervising physician immediately available to respond to emergencies  See telemetry face sheet for immediately available ER MD    Location  [facility]-Cardiac & Pulmonary Rehab    Staff Present  Heath Lark, RN, BSN, CCRP;Amanda Sommer, BA, ACSM CEP, Exercise Physiologist;Joseph Hood RCP,RRT,BSRT    Virtual Visit  No    Medication changes reported      No    Fall or balance concerns reported     No    Warm-up and Cool-down  Performed on first and last piece of equipment    Resistance Training Performed  Yes    VAD Patient?  No    PAD/SET Patient?  No      Pain Assessment   Currently in Pain?  No/denies          Social History   Tobacco Use  Smoking Status Never Smoker  Smokeless Tobacco Never Used    Goals Met:  Independence with exercise equipment Exercise tolerated well No report of cardiac concerns or symptoms  Goals Unmet:  Not Applicable  Comments:  Jesse Norton graduated today from  rehab [phone] sessions completed.  Details of the patient's exercise prescription and what He needs to do in order to continue the prescription and progress were discussed with patient.  Patient was given a copy of prescription and goals.  Patient verbalized understanding.  Jesse Norton plans to continue to exercise by using stationary bike at home and he goes hiking.    Dr. Emily Filbert is Medical Director for [facility]monary [facility].

## 2019-09-30 NOTE — Progress Notes (Signed)
Discharge Progress Report  Patient Details  Name: Jesse Norton MRN: [id number] Date of Birth: 1946/02/08 Referring Provider:     Cardiac Rehab from 02/13/2019 in [facility]  Referring Provider  End       Number of Visits: 36  Reason for Discharge:  Patient reached a stable level of exercise. Patient independent in their exercise. Patient has met program and personal goals.  Smoking History:  Social History   Tobacco Use  Smoking Status Never Smoker  Smokeless Tobacco Never Used    Diagnosis:  Status post coronary artery stent placement  ADL UCSD:   Initial Exercise Prescription:   Discharge Exercise Prescription (Final Exercise Prescription Changes): Exercise Prescription Changes - 09/23/19 1100      Response to Exercise   Blood Pressure (Admit)  126/74    Blood Pressure (Exercise)  158/74    Blood Pressure (Exit)  138/76    Heart Rate (Admit)  69 bpm    Heart Rate (Exercise)  95 bpm    Heart Rate (Exit)  78 bpm    Rating of Perceived Exertion (Exercise)  12    Symptoms  none    Duration  Continue with 30 min of aerobic exercise without signs/symptoms of physical distress.    Intensity  THRR unchanged      Progression   Progression  Continue to progress workloads to maintain intensity without signs/symptoms of physical distress.    Average METs  3.18      Resistance Training   Training Prescription  Yes    Weight  10 lbs    Reps  10-15      Interval Training   Interval Training  No      Treadmill   MPH  3.2    Grade  2.5    Minutes  15    METs  4.55      NuStep   Level  5    Minutes  15    METs  3.2      Recumbant Elliptical   Level  3    Minutes  15    METs  2      Elliptical   Level  2    Speed  4.1    Minutes  15      Home Exercise Plan   Plans to continue exercise at  Home (comment)   walking, biking   Frequency  Add 2 additional days to program exercise sessions.    Initial Home Exercises Provided   03/21/19       Functional Capacity: 6 Minute Walk    Row Name 09/11/19 1106         6 Minute Walk   Phase  Discharge     Distance  1530 feet     Distance % Change  27.5 %     Distance Feet Change  330 ft     Walk Time  6 minutes     # of Rest Breaks  0     MPH  2.9     METS  2.65     RPE  12     VO2 Peak  9.27     Symptoms  No     Resting HR  69 bpm     Resting BP  126/74     Max Ex. HR  95 bpm     Max Ex. BP  158/76        Psychological, QOL, Others - Outcomes:  PHQ 2/9: Depression screen [facility] 2/9 09/24/2019 08/19/2019 06/04/2019 02/13/2019 02/12/2019  Decreased Interest 0 0 0 0 0  Down, Depressed, Hopeless 0 0 1 0 0  PHQ - 2 Score 0 0 1 0 0  Altered sleeping 0 0 - 1 1  Tired, decreased energy 0 0 - 0 0  Change in appetite 0 0 - 0 0  Feeling bad or failure about yourself  0 0 - 0 0  Trouble concentrating 0 0 - 0 0  Moving slowly or fidgety/restless 0 0 - 0 0  Suicidal thoughts 0 0 - 0 0  PHQ-9 Score 0 0 - 1 1  Difficult doing work/chores Not difficult at all Not difficult at all - Not difficult at all Not difficult at all    Quality of Life: Quality of Life - 09/24/19 1521      Quality of Life   Select  Quality of Life      Quality of Life Scores   Health/Function Pre  25.27 %    Health/Function Post  27.27 %    Health/Function % Change  7.91 %    Socioeconomic Pre  30 %    Socioeconomic Post  29.64 %    Socioeconomic % Change   -1.2 %    Psych/Spiritual Pre  25.79 %    Psych/Spiritual Post  27.43 %    Psych/Spiritual % Change  6.36 %    Family Pre  25.6 %    Family Post  30 %    Family % Change  17.19 %    GLOBAL Pre  26.4 %    GLOBAL Post  28.19 %    GLOBAL % Change  6.78 %       Personal Goals: Goals established at orientation with interventions provided to work toward goal.    Personal Goals Discharge: Goals and Risk Factor Review    Row Name 07/10/19 1441 07/30/19 1416 08/27/19 1410         Core Components/Risk Factors/Patient Goals Review    Personal Goals Review  Weight Management/Obesity;Lipids;Hypertension  -  Weight Management/Obesity;Lipids;Hypertension     Review  Patient states he is taking his medications at home and checking his blood pressure. He was checking his blood pressure after lunch and was not resting enough for a true resting blood pressure. Informed patient to take his blood pressure everday at the same time for a week to get a true resting blood pressure.  Jesse Norton is taking meds and BP at home.  he has no other current concerns.  Jesse Norton is doing well in rehab.  He got good reports at both his cardiologist and primary.  He is working on weight loss still.  Starting next week, he will reduce some calories to try to get a jump start. He is doing well with his pressures and continues to check them at home.     Expected Outcomes  Short: check blood pressure daily. Long: maintain a healthy blood pressure.  Short - continue checking BP Long - maintain healthy BP  Short: Continue to work on weight loss.  Long: Continue to monitor risk factors.        Exercise Goals and Review:   Exercise Goals Re-Evaluation: Exercise Goals Re-Evaluation    Row Name 07/02/19 1414 07/14/19 0959 07/17/19 1534 07/30/19 1418 08/01/19 1115     Exercise Goal Re-Evaluation   Exercise Goals Review  Increase Physical Activity;Able to understand and use rate of perceived exertion (RPE) scale;Knowledge and understanding  of Target Heart Rate Range (THRR);Understanding of Exercise Prescription;Increase Strength and Stamina;Able to check pulse independently  Increase Physical Activity;Increase Strength and Stamina;Understanding of Exercise Prescription  Increase Physical Activity  Increase Physical Activity;Increase Strength and Stamina;Able to understand and use rate of perceived exertion (RPE) scale;Knowledge and understanding of Target Heart Rate Range (THRR);Able to check pulse independently;Understanding of Exercise Prescription  Increase Physical  Activity;Increase Strength and Stamina;Able to understand and use rate of perceived exertion (RPE) scale;Knowledge and understanding of Target Heart Rate Range (THRR);Able to check pulse independently;Understanding of Exercise Prescription   Comments  Reviewed RPE scale, THR and program prescription with pt today.  Pt voiced understanding and was given a copy of goals to take home.  Jayke is off to a good start. He has even started on the elliptical and up to 3 METs on the NuStep. We will continue to monitor his progress.  Travontae talked about his home exercise routine today.  walking alternate days, biking 10 miles or rollers, cardio. USes stationary bike .  6 days a week routine  Latoya continues his home exercise.  He is checking HR etc and maintains consistent RPM on bike.  Keynan is progressing well in HT.  He is up to 10 lb weights and has increased levels on all machines   Expected Outcomes  Short: Use RPE daily to regulate intensity. Long: Follow program prescription in THR.  Short: Continue to increase workloads.  Long: Continue to increase strength and stamina.  Short: Continue to increase workloads.  Long: Continue to increase strength and stamina.  -  Short - continue to attend consistently Long - increase overall MET level   Row Name 08/12/19 1551 08/27/19 1409 08/29/19 0753 09/09/19 1425 09/23/19 1136     Exercise Goal Re-Evaluation   Exercise Goals Review  Increase Physical Activity;Increase Strength and Stamina;Able to understand and use rate of perceived exertion (RPE) scale;Able to understand and use Dyspnea scale;Knowledge and understanding of Target Heart Rate Range (THRR);Able to check pulse independently;Understanding of Exercise Prescription  Increase Physical Activity;Increase Strength and Stamina;Understanding of Exercise Prescription  Increase Physical Activity;Increase Strength and Stamina;Able to understand and use rate of perceived exertion (RPE) scale;Knowledge and  understanding of Target Heart Rate Range (THRR);Able to check pulse independently;Understanding of Exercise Prescription  Increase Physical Activity;Increase Strength and Stamina;Understanding of Exercise Prescription  Increase Physical Activity;Increase Strength and Stamina;Understanding of Exercise Prescription   Comments  Kuper is up to 4.3 METS on TM.  Staff will monitor progress  Quinnlan is doing well in rehab.  He is exercising at home consistently.  Friday hikes, Saturday hike and bike, Sunday stretch and balance, Monday bike.  He feels like his strength and stamina are doing well.  Cecilio is very consistent with his exercise at home and attendance at rehab.  He is meeting program goals.  Richared continues to do well in rehab.  He is hiking again and doing well.  He is doing well on the elliptical and feeling stronger.  He is up to 2.5% on the treadmill!  We will continue to monitor his progress.  Geraldo will be graduating soon!  He improved his post 6MWT by more than 27%!  He is planning to continue to exercise by hiking and walking.   Expected Outcomes  Short - consider interval training Long - maintain increase in MET level  Short: Continue to add in intervals.  Long: continue to exericse on own.  Short - continue intervals Long - maintain current fitness level  Short: Contnue to add in intervlas even on upright equipment. Long: Continue to improve stamina.  Short: Graduate!!  Long: Continue exercise independently.      Nutrition & Weight - Outcomes:  Post Biometrics - 09/11/19 1107       Post  Biometrics   Height  5' 8.9" (1.75 m)    Weight  255 lb 11.2 oz (116 kg)    BMI (Calculated)  37.87    Single Leg Stand  30 seconds       Nutrition: Nutrition Therapy & Goals - 07/07/19 1510      Nutrition Therapy   Diet  Low Na HH diet    Drug/Food Interactions  Statins/Certain Fruits   Lipitor   Protein (specify units)  95g    Fiber  30 grams    Whole Grain Foods  3 servings     Saturated Fats  12 max. grams    Fruits and Vegetables  5 servings/day    Sodium  1.5 grams      Personal Nutrition Goals   Nutrition Goal  ST: navigate HH eating LT: get back into shape    Comments  Pt is on Lasixs (checks his legs every morning). Pt reports having good days and bad days. Pt wants help with his trip food list (pt provided - will review and disucss). Pt reports trying to choose lean meats and low sodium options as well as vegetables. Pt asked about asian cuisine and sodium intake; discussed that he could choose low sodium options and make some things himself, but in terms of sauce the biggest impact would be managing quantity. Discussed HH diet and low Na. Pt food list: B: eggs, bacon, coffee, juice. Oatmeal, prunes, brown sugar, coffee. cold cereal, fruit cup, powdered milk, coffee (granola, cheerios). pancakes, blueberry syrup, coffee. L: canned salmon or canned chicken and cheese wrap, chips, candy bar. Instant soup, nabs (peanut butter or cheese crackers), summer sausage, cookies. Cold cuts, apple, pickles, rolls. Canned tuna or smoked oysters, nabs, cookies, mayo and pickles. D: country ham, pinto beans, cornbread, pudding. steak, instant potatoes, canned peas, cookies. spam/canned chicken, rice, string beans, blueberry or apple muffins, pineapple chunks. canned lasanga/ spaghetti, cheese, pregos sauce, muffins, pudding.[place] stew, muffins.      Intervention Plan   Intervention  Prescribe, educate and counsel regarding individualized specific dietary modifications aiming towards targeted core components such as weight, hypertension, lipid management, diabetes, heart failure and other comorbidities.;Nutrition handout(s) given to patient.    Expected Outcomes  Short Term Goal: Understand basic principles of dietary content, such as calories, fat, sodium, cholesterol and nutrients.;Short Term Goal: A plan has been developed with personal nutrition goals set during dietitian  appointment.;Long Term Goal: Adherence to prescribed nutrition plan.       Nutrition Discharge: Nutrition Assessments - 09/24/19 1521      MEDFICTS Scores   Pre Score  78    Post Score  24    Score Difference  -54       Education Questionnaire Score: Knowledge Questionnaire Score - 09/24/19 1521      Knowledge Questionnaire Score   Pre Score  24/26    Post Score  26/26       Goals reviewed with patient; copy given to patient.

## 2019-10-02 ENCOUNTER — Ambulatory Visit: Payer: Medicare Other

## 2019-10-09 ENCOUNTER — Ambulatory Visit: Payer: Medicare Other

## 2019-10-21 ENCOUNTER — Ambulatory Visit (INDEPENDENT_AMBULATORY_CARE_PROVIDER_SITE_OTHER): Payer: Medicare Other

## 2019-10-21 ENCOUNTER — Other Ambulatory Visit: Payer: Self-pay

## 2019-10-21 DIAGNOSIS — Z23 Encounter for immunization: Secondary | ICD-10-CM | POA: Diagnosis not present

## 2019-10-22 ENCOUNTER — Telehealth: Payer: Self-pay

## 2019-11-28 DIAGNOSIS — H2513 Age-related nuclear cataract, bilateral: Secondary | ICD-10-CM | POA: Diagnosis not present

## 2019-12-18 ENCOUNTER — Inpatient Hospital Stay: Payer: Medicare Other | Attending: Internal Medicine

## 2019-12-18 ENCOUNTER — Other Ambulatory Visit: Payer: Self-pay

## 2019-12-18 ENCOUNTER — Inpatient Hospital Stay (HOSPITAL_BASED_OUTPATIENT_CLINIC_OR_DEPARTMENT_OTHER): Payer: Medicare Other | Admitting: Internal Medicine

## 2019-12-18 DIAGNOSIS — C884 Extranodal marginal zone b-cell lymphoma of mucosa-associated lymphoid tissue (malt-lymphoma) not having achieved remission: Secondary | ICD-10-CM

## 2019-12-18 DIAGNOSIS — D7589 Other specified diseases of blood and blood-forming organs: Secondary | ICD-10-CM | POA: Insufficient documentation

## 2019-12-18 DIAGNOSIS — I10 Essential (primary) hypertension: Secondary | ICD-10-CM | POA: Insufficient documentation

## 2019-12-18 DIAGNOSIS — Z86010 Personal history of colonic polyps: Secondary | ICD-10-CM | POA: Insufficient documentation

## 2019-12-18 DIAGNOSIS — Z8572 Personal history of non-Hodgkin lymphomas: Secondary | ICD-10-CM | POA: Diagnosis not present

## 2019-12-18 DIAGNOSIS — Z9081 Acquired absence of spleen: Secondary | ICD-10-CM | POA: Diagnosis not present

## 2019-12-18 DIAGNOSIS — I2510 Atherosclerotic heart disease of native coronary artery without angina pectoris: Secondary | ICD-10-CM | POA: Diagnosis not present

## 2019-12-18 LAB — CBC WITH DIFFERENTIAL/PLATELET
Abs Immature Granulocytes: 0.03 10*3/uL (ref 0.00–0.07)
Basophils Absolute: 0.1 10*3/uL (ref 0.0–0.1)
Basophils Relative: 2 %
Eosinophils Absolute: 0.2 10*3/uL (ref 0.0–0.5)
Eosinophils Relative: 3 %
HCT: 40.9 % (ref 39.0–52.0)
Hemoglobin: 13.6 g/dL (ref 13.0–17.0)
Immature Granulocytes: 0 %
Lymphocytes Relative: 34 %
Lymphs Abs: 2.4 10*3/uL (ref 0.7–4.0)
MCH: 34.5 pg — ABNORMAL HIGH (ref 26.0–34.0)
MCHC: 33.3 g/dL (ref 30.0–36.0)
MCV: 103.8 fL — ABNORMAL HIGH (ref 80.0–100.0)
Monocytes Absolute: 1 10*3/uL (ref 0.1–1.0)
Monocytes Relative: 14 %
Neutro Abs: 3.4 10*3/uL (ref 1.7–7.7)
Neutrophils Relative %: 47 %
Platelets: 242 10*3/uL (ref 150–400)
RBC: 3.94 MIL/uL — ABNORMAL LOW (ref 4.22–5.81)
RDW: 13 % (ref 11.5–15.5)
WBC: 7.1 10*3/uL (ref 4.0–10.5)
nRBC: 0.3 % — ABNORMAL HIGH (ref 0.0–0.2)

## 2019-12-18 LAB — COMPREHENSIVE METABOLIC PANEL
ALT: 27 U/L (ref 0–44)
AST: 33 U/L (ref 15–41)
Albumin: 4.4 g/dL (ref 3.5–5.0)
Alkaline Phosphatase: 62 U/L (ref 38–126)
Anion gap: 9 (ref 5–15)
BUN: 19 mg/dL (ref 8–23)
CO2: 30 mmol/L (ref 22–32)
Calcium: 9.5 mg/dL (ref 8.9–10.3)
Chloride: 101 mmol/L (ref 98–111)
Creatinine, Ser: 0.89 mg/dL (ref 0.61–1.24)
GFR calc Af Amer: >60 mL/min (ref >60)
GFR calc non Af Amer: >60 mL/min (ref >60)
Glucose, Bld: 112 mg/dL — ABNORMAL HIGH (ref 70–99)
Potassium: 4.4 mmol/L (ref 3.5–5.1)
Sodium: 140 mmol/L (ref 135–145)
Total Bilirubin: 1.1 mg/dL (ref 0.3–1.2)
Total Protein: 7.3 g/dL (ref 6.5–8.1)

## 2019-12-18 LAB — LACTATE DEHYDROGENASE: LDH: 189 U/L (ref 98–192)

## 2019-12-18 NOTE — Assessment & Plan Note (Addendum)
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 STABLE.   #No clinical evidence of recurrence.  Given the low risk of recurrence would recommend surveillance without any imaging at this time.  Patient agreement.  # Mild macrocytosis- no anemia- monitor for now; no heavy alcohol.   #Multiple colon polyps/[facility] surveillance Dr. Gustavo Lah- last EGD/[place]- Jan 2020.  Reviewed the EGD/pathology-no evidence of progression- STABLE.   # CAD s/p stenting [Dr.End ] s/p cardiac rehab-STABLE.   # # I discussed regarding Covid precautions/and also discussed proceeding with Covid vaccination when available.  Discussed that unfortunately the data safety and efficacy of vaccination in long sterm is unclear [especially in patients with immunocompromised state].  However, I think the benefits of the vaccination outweigh the potential risks.   # DISPOSITION: # follow up in 6 months [pref] MD/labs- cbc/cmp/ldh-Dr.B

## 2019-12-18 NOTE — Progress Notes (Signed)
[facility] NOTE  Patient Care Team: [name], MD as PCP - General (Family Medicine) End, Harrell Gave, MD as PCP - Cardiology (Cardiology) Lollie Sails, MD as Consulting Physician (Gastroenterology) Minor, Dalbert Garnet, RN as [facility]  CHIEF COMPLAINTS/PURPOSE OF CONSULTATION:  Lymphoma  #  Oncology History Overview Note  # NOV 2019- [place] [II opinion at [facility] path]; s/p sigmoid colon polyp resection [Dr.Skulskie; incidental]; PET scan -NED  # Barrett's esophagus- 2018;#Colonic polyps surveillance/Dr. 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  male is here for follow-up of his MALT lymphoma of the colon/polyp status post resection.  In Jan 2020 patient had EGD colonoscopy no evidence of any progressive malignancy.    No new lumps or bumps.  Appetite is good.  No weight loss.  Review of Systems  Constitutional: Negative for chills, diaphoresis, fever, malaise/fatigue and weight loss.  HENT: Negative for nosebleeds and sore throat.   Eyes: Negative for double vision.  Respiratory: Negative for cough, hemoptysis, sputum production, shortness of breath and wheezing.   Cardiovascular: Negative for chest pain, palpitations, orthopnea and leg swelling.  Gastrointestinal: Negative for abdominal pain, blood in stool, constipation, diarrhea, heartburn, melena, nausea and vomiting.  Genitourinary: Negative for dysuria, frequency and urgency.  Musculoskeletal: Negative  for back pain and joint pain.  Skin: Negative.  Negative for itching and rash.  Neurological: Negative for dizziness, tingling, focal weakness, weakness and headaches.  Endo/Heme/Allergies: Does not bruise/bleed easily.  Psychiatric/Behavioral: Negative for depression. The patient is not nervous/anxious and does not have insomnia.      MEDICAL HISTORY:  Past Medical History:  Diagnosis Date  . (HFpEF) heart failure with preserved ejection fraction ([place])    a. 01/2019 Cath: EF 55-60% w/ elevated LVEDP.  Marland Kitchen Anxiety   . Barrett's esophagus   . CAD (coronary artery disease)    a. 10/2018 ETT: 7mm horizontal inflat ST depression, freq PVC's->Intermediate; b. 12/2018 Cardiac CTA: LM nl, LAD FFRct 0.6 in mLAD, LCX FFRct 0.99p, 0.39m, 0.77d, RCA no signif dzs, FFRct 0.89d->Rec cath; c. 01/2019 PCI: LM nl, LAD 40p, 74m, D2 70, LCX 70p (2.75x15 Resolute Onyx DES), RCA min irregs. EF 55-65%. EDP 25-66mmHg; d. 01/2019 PCI: LAD 80-90 (atherectomy & 3x15 Resolute Onyx DES).  . Colon polyps   . Duodenal adenoma   . Gastritis   . GERD (gastroesophageal reflux disease)   . Gout   . H. pylori infection   . H/O urethral stricture   . Hemorrhoids   . Hiatal hernia   . Hypertension   . Stomach ulcer     SURGICAL HISTORY: Past Surgical History:  Procedure Laterality Date  . COLONOSCOPY    . COLONOSCOPY WITH PROPOFOL N/A 08/20/2018   Procedure: COLONOSCOPY WITH PROPOFOL;  Surgeon: Lollie Sails, MD;  Location: [facility] ENDOSCOPY;  Service: Endoscopy;  Laterality: N/A;  . COLONOSCOPY WITH PROPOFOL N/A 12/19/2018   Procedure: COLONOSCOPY WITH PROPOFOL;  Surgeon: Lollie Sails, MD;  Location: [facility]  ENDOSCOPY;  Service: Endoscopy;  Laterality: N/A;  . CORONARY ATHERECTOMY N/A 02/03/2019   Procedure: CORONARY ATHERECTOMY;  Surgeon: Nelva Bush, MD;  Location: [place] CV LAB;  Service: Cardiovascular;  Laterality: N/A;  . CORONARY STENT INTERVENTION N/A 01/21/2019   Procedure: CORONARY STENT  INTERVENTION;  Surgeon: Nelva Bush, MD;  Location: [place] CV LAB;  Service: Cardiovascular;  Laterality: N/A;  . CORONARY STENT INTERVENTION N/A 02/03/2019   Procedure: CORONARY STENT INTERVENTION;  Surgeon: Nelva Bush, MD;  Location: [place] CV LAB;  Service: Cardiovascular;  Laterality: N/A;  . CYSTOSCOPY WITH URETHRAL DILATATION N/A 08/16/2018   Procedure: CYSTOSCOPY WITH URETHRAL DILATATION;  Surgeon: [name], MD;  Location: [facility] ORS;  Service: Urology;  Laterality: N/A;  . ESOPHAGOGASTRODUODENOSCOPY N/A 01/29/2017   Procedure: ESOPHAGOGASTRODUODENOSCOPY (EGD);  Surgeon: Lollie Sails, MD;  Location: [facility] ENDOSCOPY;  Service: Endoscopy;  Laterality: N/A;  . ESOPHAGOGASTRODUODENOSCOPY (EGD) WITH PROPOFOL N/A 07/10/2016   Procedure: ESOPHAGOGASTRODUODENOSCOPY (EGD) WITH PROPOFOL;  Surgeon: Lollie Sails, MD;  Location: [facility] ENDOSCOPY;  Service: Endoscopy;  Laterality: N/A;  . ESOPHAGOGASTRODUODENOSCOPY (EGD) WITH PROPOFOL N/A 12/19/2018   Procedure: ESOPHAGOGASTRODUODENOSCOPY (EGD) WITH PROPOFOL;  Surgeon: Lollie Sails, MD;  Location: [facility] ENDOSCOPY;  Service: Endoscopy;  Laterality: N/A;  . gastritis    . h.pylori    . INTRAVASCULAR ULTRASOUND/IVUS N/A 02/03/2019   Procedure: Intravascular Ultrasound/IVUS;  Surgeon: Nelva Bush, MD;  Location: [place] CV LAB;  Service: Cardiovascular;  Laterality: N/A;  . left elbow repair Left   . LEFT HEART CATH AND CORONARY ANGIOGRAPHY Left 01/21/2019   Procedure: LEFT HEART CATH AND CORONARY ANGIOGRAPHY;  Surgeon: Nelva Bush, MD;  Location: [employer] CV LAB;  Service: Cardiovascular;  Laterality: Left;  . SPLENECTOMY  1957  . TONSILLECTOMY      SOCIAL HISTORY: Social History   Socioeconomic History  . Marital status: Married    Spouse name: Not on file  . Number of children: Not on file  . Years of education: Not on file  . Highest education level: Master's degree (e.g., MA, MS, MEng, MEd,  MSW, MBA)  Occupational History  . Occupation: retired   Tobacco Use  . Smoking status: Never Smoker  . Smokeless tobacco: Never Used  Substance and Sexual Activity  . Alcohol use: Yes    Alcohol/week: 5.0 standard drinks    Types: 5 Glasses of wine per week    Comment: OCCASIONAL  . Drug use: No  . Sexual activity: Not on file  Other Topics Concern  . Not on file  Social History Narrative   Rides bicycle with friends       No smoking.  Occasional alcohol.  Lives at home.  Retired Customer service manager.   Social Determinants of Health   Financial Resource Strain:   . Difficulty of Paying Living Expenses: Not on file  Food Insecurity:   . Worried About Charity fundraiser in the Last Year: Not on file  . Ran Out of Food in the Last Year: Not on file  Transportation Needs:   . Lack of Transportation (Medical): Not on file  . Lack of Transportation (Non-Medical): Not on file  Physical Activity:   . Days of Exercise per Week: Not on file  . Minutes of Exercise per Session: Not on file  Stress:   . Feeling of Stress : Not on file  Social Connections:   . Frequency of Communication with Friends and Family: Not on file  . Frequency of Social Gatherings with  Friends and Family: Not on file  . Attends Religious Services: Not on file  . Active Member of Clubs or Organizations: Not on file  . Attends Archivist Meetings: Not on file  . Marital Status: Not on file  Intimate Partner Violence:   . Fear of Current or Ex-Partner: Not on file  . Emotionally Abused: Not on file  . Physically Abused: Not on file  . Sexually Abused: Not on file    FAMILY HISTORY: Family History  Adopted: Yes  Problem Relation Age of Onset  . Heart attack Mother 46  . Pancreatic cancer Father        died in WWII - pt adopted  . Hypertension Sister   . Hyperlipidemia Sister   . Hypertension Brother   . Hyperlipidemia Brother   . Prostate cancer Neg Hx   . Kidney cancer Neg Hx   . Bladder Cancer Neg  Hx     ALLERGIES:  has No Known Allergies.  MEDICATIONS:  Current Outpatient Medications  Medication Sig Dispense Refill  . allopurinol (ZYLOPRIM) 300 MG tablet Take 1 tablet (300 mg total) by mouth daily. 90 tablet 4  . amLODipine (NORVASC) 10 MG tablet Take 1 tablet (10 mg total) by mouth daily. 90 tablet 4  . aspirin EC 81 MG tablet Take 81 mg by mouth daily.    Marland Kitchen atorvastatin (LIPITOR) 40 MG tablet Take 1 tablet (40 mg total) by mouth daily. 90 tablet 4  . clopidogrel (PLAVIX) 75 MG tablet Take 1 tablet (75 mg total) by mouth daily with breakfast. 90 tablet 3  . FLUoxetine (PROZAC) 20 MG capsule Take 1 capsule (20 mg total) by mouth daily. 90 capsule 4  . LORazepam (ATIVAN) 1 MG tablet Take 0.5-1 tablets (0.5-1 mg total) by mouth daily as needed for anxiety. 30 tablet 1  . losartan (COZAAR) 100 MG tablet Take 1 tablet (100 mg total) by mouth daily. 90 tablet 3  . metoprolol tartrate (LOPRESSOR) 25 MG tablet Take 1 tablet (25 mg total) by mouth 2 (two) times daily. 180 tablet 3  . Multiple Vitamin (MULTIVITAMIN) tablet Take 1 tablet by mouth daily.    . Multiple Vitamins-Minerals (EMERGEN-C IMMUNE PO) Take 1 tablet by mouth every other day.     . pantoprazole (PROTONIX) 20 MG tablet Take 1 tablet (20 mg total) by mouth 2 (two) times daily. 180 tablet 4  . furosemide (LASIX) 20 MG tablet Take 1 tablet (20 mg total) by mouth daily for 30 days. (Patient taking differently: Take 20 mg by mouth daily. Daily) 90 tablet 3   No current facility-administered medications for this visit.      Marland Kitchen  PHYSICAL EXAMINATION: ECOG PERFORMANCE STATUS: 0 - Asymptomatic  Vitals:   12/18/19 1045  BP: (!) 156/89  Pulse: 76  Temp: (!) 97.3 F (36.3 C)   Filed Weights   12/18/19 1045  Weight: 264 lb (119.7 kg)    Physical Exam  Constitutional: He is oriented to person, place, and time and well-developed, well-nourished, and in no distress.  HENT:  Head: Normocephalic and atraumatic.   Mouth/Throat: Oropharynx is clear and moist. No oropharyngeal exudate.  Eyes: Pupils are equal, round, and reactive to light.  Cardiovascular: Normal rate and regular rhythm.  Pulmonary/Chest: No respiratory distress. He has no wheezes.  Abdominal: Soft. Bowel sounds are normal. He exhibits no distension and no mass. There is no abdominal tenderness. There is no rebound and no guarding.  Musculoskeletal:  General: No tenderness or edema. Normal range of motion.     Cervical back: Normal range of motion and neck supple.  Neurological: He is alert and oriented to person, place, and time.  Skin: Skin is warm.  Psychiatric: Affect normal.     LABORATORY DATA:  I have reviewed the data as listed Lab Results  Component Value Date   WBC 7.1 12/18/2019   HGB 13.6 12/18/2019   HCT 40.9 12/18/2019   MCV 103.8 (H) 12/18/2019   PLT 242 12/18/2019   Recent Labs    02/04/19 0529 02/12/19 1350 06/12/19 1022 12/18/19 1023  NA 138  --  139 140  K 4.1  --  4.2 4.4  CL 104  --  100 101  CO2 26  --  29 30  GLUCOSE 112*  --  119* 112*  BUN 14  --  19 19  CREATININE 0.96  --  0.96 0.89  CALCIUM 9.0  --  9.3 9.5  GFRNONAA >60  --  >60 >60  GFRAA >60  --  >60 >60  PROT  --   --  7.6 7.3  ALBUMIN  --   --  4.4 4.4  AST  --  38 35 33  ALT  --  30 31 27   ALKPHOS  --   --  66 62  BILITOT  --   --  1.2 1.1    RADIOGRAPHIC STUDIES: I have personally reviewed the radiological images as listed and agreed with the findings in the report. No results found.  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 STABLE.   #No clinical evidence of recurrence.  Given the low risk of recurrence would recommend surveillance without any imaging at this time.  Patient agreement.  # Mild macrocytosis-  no anemia- monitor for now; no heavy alcohol.   #Multiple colon polyps/Barrett's surveillance Dr. Gustavo Lah- last EGD/Colo- Jan 2020.  Reviewed the EGD/pathology-no evidence of progression- STABLE.   # CAD s/p stenting [Dr.End ] s/p cardiac rehab-STABLE.   # # I discussed regarding Covid precautions/and also discussed proceeding with Covid vaccination when available.  Discussed that unfortunately the data safety and efficacy of vaccination in long sterm is unclear [especially in patients with immunocompromised state].  However, I think the benefits of the vaccination outweigh the potential risks.   # DISPOSITION: # follow up in 6 months [pref] MD/labs- cbc/cmp/ldh-Dr.B      All questions were answered. The patient knows to call the clinic with any problems, questions or concerns.       Cammie Sickle, MD 12/19/2019 9:46 AM

## 2020-01-06 ENCOUNTER — Ambulatory Visit: Payer: Self-pay | Admitting: Family Medicine

## 2020-02-17 ENCOUNTER — Ambulatory Visit: Payer: Medicare Other | Admitting: Nurse Practitioner

## 2020-02-17 ENCOUNTER — Encounter: Payer: Self-pay | Admitting: Nurse Practitioner

## 2020-02-17 ENCOUNTER — Telehealth (INDEPENDENT_AMBULATORY_CARE_PROVIDER_SITE_OTHER): Payer: Medicare Other | Admitting: Nurse Practitioner

## 2020-02-17 VITALS — BP 138/77 | Wt 256.0 lb

## 2020-02-17 DIAGNOSIS — F419 Anxiety disorder, unspecified: Secondary | ICD-10-CM

## 2020-02-17 DIAGNOSIS — E785 Hyperlipidemia, unspecified: Secondary | ICD-10-CM | POA: Diagnosis not present

## 2020-02-17 DIAGNOSIS — I10 Essential (primary) hypertension: Secondary | ICD-10-CM | POA: Diagnosis not present

## 2020-02-17 DIAGNOSIS — F339 Major depressive disorder, recurrent, unspecified: Secondary | ICD-10-CM

## 2020-02-17 MED ORDER — LORAZEPAM 1 MG PO TABS: 0.5000 mg | ORAL_TABLET | Freq: Every day | ORAL | 1 refills | Status: DC | PRN

## 2020-02-17 NOTE — Assessment & Plan Note (Signed)
Chronic, ongoing.  At length discussion on risks of long term benzo use, he wishes to continue treatment and reports using minimally.  Refills sent #30 pills with 1 refill.  This often lasts 6 months.  Return in 6 months for annual physical and will need UDS and contract at that time.

## 2020-02-17 NOTE — Assessment & Plan Note (Signed)
Chronic, ongoing.  Continue current medication and adjust as needed.  Lipid panel outpatient.

## 2020-02-17 NOTE — Patient Instructions (Signed)

## 2020-02-17 NOTE — Assessment & Plan Note (Signed)
Chronic, stable.  Denies SI/HI.  Continue current medication regimen and adjust as needed.  Recommend relaxation and meditation techniques at home.

## 2020-02-17 NOTE — Progress Notes (Signed)
BP 138/77   Wt 256 lb (116.1 kg)   BMI 37.91 kg/m    Subjective:    Patient ID: Jesse Norton, male    DOB: [birth year], 74 y.o.   MRN: [id number]  HPI: Jesse Norton is a 74 y.o. male  Chief Complaint  Patient presents with  . Anxiety  . Hypertension  . Hyperlipidemia    . This visit was completed via MyChart due to the restrictions of the COVID-19 pandemic. All issues as above were discussed and addressed. Physical exam was done as above through visual confirmation on MyChart. If it was felt that the patient should be evaluated in the office, they were directed there. The patient verbally consented to this visit. . Location of the patient: home . Location of the provider: work . Those involved with this call:  . Provider: Marnee Guarneri, DNP . CMA: Lesle Chris, [place] . Front Desk/Registration: Don Perking  . Time spent on call: 15 minutes with patient face to face via video conference. More than 50% of this time was spent in counseling and coordination of care. 10 minutes total spent in review of patient's record and preparation of their chart.  . I verified patient identity using two factors (patient name and date of birth). Patient consents verbally to being seen via telemedicine visit today.    HYPERTENSION / HYPERLIPIDEMIA Followed by cardiology and last seen 08/19/2019.  Continues on Losartan, Amlodipine, Lasix, Metoprolol, and Atorvastatin + ASA and Plavix.  He sees cardiology again tomorrow.  CMP done last in January and Lipid panel in September, goal levels present. Satisfied with current treatment? yes Duration of hypertension: chronic BP monitoring frequency: a few times a week BP range: on average 135-140/77-82 BP medication side effects: no Duration of hyperlipidemia: chronic Cholesterol medication side effects: no Cholesterol supplements: none Medication compliance: good compliance Aspirin: yes Recent stressors: no Recurrent headaches: no Visual  changes: no Palpitations: no Dyspnea: no Chest pain: no Lower extremity edema: no Dizzy/lightheaded: no   ANXIETY/STRESS Continues on Ativan 0.5 to 1 MG as needed daily and Prozac 20 MG daily.  Takes Ativan as needed only, in about a week he may take one a day for two days and then go two weeks without taking any.  He often gets 4 refills a year. Pt is aware of risks of psychoactive medication use to include increased sedation, respiratory suppression, falls, extrapyramidal movements,  dependence and cardiovascular events.  Pt would like to continue treatment as benefit determined to outweigh risk.  On review of PMP last Ativan fill 11/27/19 for #30 tablets.   Duration:stable Anxious mood: no  Excessive worrying: no Irritability: no  Sweating: no Nausea: no Palpitations:no Hyperventilation: no Panic attacks: no Agoraphobia: no  Obscessions/compulsions: no Depressed mood: no Depression screen [facility] 2/9 02/17/2020 09/24/2019 08/19/2019 06/04/2019 02/13/2019  Decreased Interest 0 0 0 0 0  Down, Depressed, Hopeless 0 0 0 1 0  PHQ - 2 Score 0 0 0 1 0  Altered sleeping 1 0 0 - 1  Tired, decreased energy 1 0 0 - 0  Change in appetite 0 0 0 - 0  Feeling bad or failure about yourself  0 0 0 - 0  Trouble concentrating 0 0 0 - 0  Moving slowly or fidgety/restless 0 0 0 - 0  Suicidal thoughts 0 0 0 - 0  PHQ-9 Score 2 0 0 - 1  Difficult doing work/chores Not difficult at all Not difficult at all Not difficult at all -  Not difficult at all   Anhedonia: no Weight changes: no Insomnia: no none  Hypersomnia: no Fatigue/loss of energy: no Feelings of worthlessness: no Feelings of guilt: no Impaired concentration/indecisiveness: no Suicidal ideations: no  Crying spells: no Recent Stressors/Life Changes: no   Relationship problems: no   Family stress: no     Financial stress: no    Job stress: no    Recent death/loss: no  Relevant past medical, surgical, family and social history reviewed and  updated as indicated. Interim medical history since our last visit reviewed. Allergies and medications reviewed and updated.  Review of Systems  Constitutional: Negative for activity change, diaphoresis, fatigue and fever.  Respiratory: Negative for cough, chest tightness, shortness of breath and wheezing.   Cardiovascular: Negative for chest pain, palpitations and leg swelling.  Gastrointestinal: Negative.   Neurological: Negative.   Psychiatric/Behavioral: Negative for decreased concentration, self-injury, sleep disturbance and suicidal ideas. The patient is not nervous/anxious.     Per HPI unless specifically indicated above     Objective:    BP 138/77   Wt 256 lb (116.1 kg)   BMI 37.91 kg/m   Wt Readings from Last 3 Encounters:  02/17/20 256 lb (116.1 kg)  12/18/19 264 lb (119.7 kg)  09/11/19 255 lb 11.2 oz (116 kg)    Physical Exam Vitals and nursing note reviewed.  Constitutional:      General: He is awake. He is not in acute distress.    Appearance: He is well-developed. He is not ill-appearing.  HENT:     Head: Normocephalic.     Right Ear: Hearing normal. No drainage.     Left Ear: Hearing normal. No drainage.  Eyes:     General: Lids are normal.        Right eye: No discharge.        Left eye: No discharge.     Conjunctiva/sclera: Conjunctivae normal.  Pulmonary:     Effort: Pulmonary effort is normal. No accessory muscle usage or respiratory distress.  Musculoskeletal:     Cervical back: Normal range of motion.  Neurological:     Mental Status: He is alert and oriented to person, place, and time.  Psychiatric:        Mood and Affect: Mood normal.        Behavior: Behavior normal. Behavior is cooperative.        Thought Content: Thought content normal.        Judgment: Judgment normal.     Results for orders placed or performed in visit on 12/18/19  Lactate dehydrogenase  Result Value Ref Range   LDH 189 98 - 192 U/L  Comprehensive metabolic panel   Result Value Ref Range   Sodium 140 135 - 145 mmol/L   Potassium 4.4 3.5 - 5.1 mmol/L   Chloride 101 98 - 111 mmol/L   CO2 30 22 - 32 mmol/L   Glucose, Bld 112 (H) 70 - 99 mg/dL   BUN 19 8 - 23 mg/dL   Creatinine, Ser 0.89 0.61 - 1.24 mg/dL   Calcium 9.5 8.9 - 10.3 mg/dL   Total Protein 7.3 6.5 - 8.1 g/dL   Albumin 4.4 3.5 - 5.0 g/dL   AST 33 15 - 41 U/L   ALT 27 0 - 44 U/L   Alkaline Phosphatase 62 38 - 126 U/L   Total Bilirubin 1.1 0.3 - 1.2 mg/dL   GFR calc non Af Amer >60 >60 mL/min   GFR calc Af Amer >60 >  60 mL/min   Anion gap 9 5 - 15  CBC with Differential  Result Value Ref Range   WBC 7.1 4.0 - 10.5 K/uL   RBC 3.94 (L) 4.22 - 5.81 MIL/uL   Hemoglobin 13.6 13.0 - 17.0 g/dL   HCT 40.9 39.0 - 52.0 %   MCV 103.8 (H) 80.0 - 100.0 fL   MCH 34.5 (H) 26.0 - 34.0 pg   MCHC 33.3 30.0 - 36.0 g/dL   RDW 13.0 11.5 - 15.5 %   Platelets 242 150 - 400 K/uL   nRBC 0.3 (H) 0.0 - 0.2 %   Neutrophils Relative % 47 %   Neutro Abs 3.4 1.7 - 7.7 K/uL   Lymphocytes Relative 34 %   Lymphs Abs 2.4 0.7 - 4.0 K/uL   Monocytes Relative 14 %   Monocytes Absolute 1.0 0.1 - 1.0 K/uL   Eosinophils Relative 3 %   Eosinophils Absolute 0.2 0.0 - 0.5 K/uL   Basophils Relative 2 %   Basophils Absolute 0.1 0.0 - 0.1 K/uL   Immature Granulocytes 0 %   Abs Immature Granulocytes 0.03 0.00 - 0.07 K/uL      Assessment & Plan:   Problem List Items Addressed This Visit      Cardiovascular and Mediastinum   Essential hypertension    Chronic, stable with BP close to goal on home readings.  Continue current medication regimen and adjust as needed, sees cardiology tomorrow will defer changes to them at this time (face to face visit with BP check).  Recent CMP on record.  Return in 6 months.        Other   Acute anxiety    Chronic, ongoing.  At length discussion on risks of long term benzo use, he wishes to continue treatment and reports using minimally.  Refills sent #30 pills with 1 refill.  This  often lasts 6 months.  Return in 6 months for annual physical and will need UDS and contract at that time.      Relevant Medications   LORazepam (ATIVAN) 1 MG tablet   Depression, recurrent (HCC) - Primary    Chronic, stable.  Denies SI/HI.  Continue current medication regimen and adjust as needed.  Recommend relaxation and meditation techniques at home.      Relevant Medications   LORazepam (ATIVAN) 1 MG tablet   Hyperlipidemia LDL goal <70    Chronic, ongoing.  Continue current medication and adjust as needed.  Lipid panel outpatient.       Relevant Orders   Lipid Panel [place], [place]      I discussed the assessment and treatment plan with the patient. The patient was provided an opportunity to ask questions and all were answered. The patient agreed with the plan and demonstrated an understanding of the instructions.   The patient was advised to call back or seek an in-person evaluation if the symptoms worsen or if the condition fails to improve as anticipated.   I provided 15+ minutes of time during this encounter.  Follow up plan: Return in about 4 months (around 06/18/2020) for mole removal.

## 2020-02-17 NOTE — Assessment & Plan Note (Signed)
Chronic, stable with BP close to goal on home readings.  Continue current medication regimen and adjust as needed, sees cardiology tomorrow will defer changes to them at this time (face to face visit with BP check).  Recent CMP on record.  Return in 6 months.

## 2020-02-18 ENCOUNTER — Ambulatory Visit (INDEPENDENT_AMBULATORY_CARE_PROVIDER_SITE_OTHER): Payer: Medicare Other | Admitting: Internal Medicine

## 2020-02-18 ENCOUNTER — Encounter: Payer: Self-pay | Admitting: Internal Medicine

## 2020-02-18 ENCOUNTER — Other Ambulatory Visit: Payer: Self-pay

## 2020-02-18 VITALS — BP 128/86 | HR 69 | Ht 67.0 in | Wt 259.5 lb

## 2020-02-18 DIAGNOSIS — E669 Obesity, unspecified: Secondary | ICD-10-CM | POA: Insufficient documentation

## 2020-02-18 DIAGNOSIS — I10 Essential (primary) hypertension: Secondary | ICD-10-CM | POA: Diagnosis not present

## 2020-02-18 DIAGNOSIS — E785 Hyperlipidemia, unspecified: Secondary | ICD-10-CM | POA: Diagnosis not present

## 2020-02-18 DIAGNOSIS — I2510 Atherosclerotic heart disease of native coronary artery without angina pectoris: Secondary | ICD-10-CM

## 2020-02-18 MED ORDER — FUROSEMIDE 20 MG PO TABS: 20.0000 mg | ORAL_TABLET | Freq: Every day | ORAL | 2 refills | Status: DC

## 2020-02-18 NOTE — Patient Instructions (Signed)
Medication Instructions:  Your physician recommends that you continue on your current medications as directed. Please refer to the Current Medication list given to you today.  *If you need a refill on your cardiac medications before your next appointment, please call your pharmacy*  Lab Work: none If you have labs (blood work) drawn today and your tests are completely normal, you will receive your results only by: Marland Kitchen MyChart Message (if you have MyChart) OR . A paper copy in the mail If you have any lab test that is abnormal or we need to change your treatment, we will call you to review the results.  Testing/Procedures: none  Follow-Up: At [facility], you and your health needs are our priority.  As part of our continuing mission to provide you with exceptional heart care, we have created designated Provider Care Teams.  These Care Teams include your primary Cardiologist (physician) and Advanced Practice Providers (APPs -  Physician Assistants and Nurse Practitioners) who all work together to provide you with the care you need, when you need it.  We recommend signing up for the patient portal called "MyChart".  Sign up information is provided on this After Visit Summary.  MyChart is used to connect with patients for Virtual Visits (Telemedicine).  Patients are able to view lab/test results, encounter notes, upcoming appointments, etc.  Non-urgent messages can be sent to your provider as well.   To learn more about what you can do with MyChart, go to [email or web address].    Your next appointment:   6 month(s)  The format for your next appointment:   In Person  Provider:    You may see Nelva Bush, MD or one of the following Advanced Practice Providers on your designated Care Team:    Murray Hodgkins, NP  Christell Faith, PA-C  Marrianne Mood, PA-C

## 2020-02-18 NOTE — Progress Notes (Signed)
Follow-up Outpatient Visit Date: 02/18/2020  Primary Care Provider: Venita Lick, NP [place] [address]  Chief Complaint: Follow-up coronary artery disease  HPI:  Jesse Norton is a 74 y.o. male with history of coronary artery disease status post PCI to LCx and LAD, HFpEF, hypertension, Barrett's esophagus, MALT-lymphoma of colon, gout, BPH, and obesity, who presents for follow-up of coronary artery disease and HFpEF.  He was last seen in our office by Jesse Norton, [place], in 08/2019.  At that time, he reported a few episodes of atypical chest pain as well as improving exertional dyspnea.  No medication changes were made.  Today, Jesse Norton reports that he is feeling well. He denies chest pain, shortness of breath, palpitations, lightheadedness, and edema. His home blood pressures are usually around 130/70. He has recently changed to a more plant-based diet. He continues to exercise regularly and looks forward to hiking and camping in [place] this summer.  --------------------------------------------------------------------------------------------------  Cardiovascular History & Procedures: Cardiovascular Problems:  Coronary artery disease  Risk Factors:  Known CAD, hypertension, male gender, obesity, and age greater than 55  Cath/PCI:  PCI (02/03/2019): 1. Severe, heavily calcified mid LAD stenosis (80-90%), unchanged from diagnostic catheterization earlier this month. 2. Widely patent LCx stent. 3. Normal left ventricular filling pressure. 4. Successful orbital atherectomy and IVUS-guided PCI to the mid LAD using a Resolute Onyx 3.0 x 15 mm drug-eluting stent (postdilated to 3.6 mm) with 0% residual stenosis and TIMI-3 flow.   LHC/PCI (01/21/2019): 1. Significant 2-vessel coronary artery disease, including 80-90% mid LAD stenosis with heavy calcification, as well as 70% proximal LCx stenosis. 2. Normal left ventricular systolic function. 3. Moderately elevated  left ventricular filling pressure. 4. Successful PCI to proximal LCx using Resolute Onyx 2.75 x 15 mm drug-eluting stent (postdilated to 3.1 mm proximally) with 0% residual stenosis and TIMI-3 flow.  CV Surgery:  None  EP Procedures and Devices:  None  Non-Invasive Evaluation(s):  Cardiac CTA (12/23/2018): Multivessel CAD with hemodynamically significant disease involving the mid LAD and distal LCx.  Exercise tolerance test (11/06/2018): Intermediate risk study with 1 mm horizontal ST depressions and frequent PVCs during stress.  Recent CV Pertinent Labs: Lab Results  Component Value Date   CHOL 151 08/19/2019   CHOL 124 02/12/2019   HDL 82 08/19/2019   LDLCALC 55 08/19/2019   TRIG 72 08/19/2019   TRIG 58 02/12/2019   CHOLHDL 1.8 08/19/2019   K 4.4 12/18/2019   K 4.0 01/06/2014   BUN 19 12/18/2019   BUN 18 01/28/2019   BUN 22 (H) 01/06/2014   CREATININE 0.89 12/18/2019   CREATININE 1.13 01/06/2014    Past medical and surgical history were reviewed and updated in EPIC.  Current Meds  Medication Sig  . allopurinol (ZYLOPRIM) 300 MG tablet Take 1 tablet (300 mg total) by mouth daily.  Marland Kitchen amLODipine (NORVASC) 10 MG tablet Take 1 tablet (10 mg total) by mouth daily.  Marland Kitchen aspirin EC 81 MG tablet Take 81 mg by mouth daily.  Marland Kitchen atorvastatin (LIPITOR) 40 MG tablet Take 1 tablet (40 mg total) by mouth daily.  . clopidogrel (PLAVIX) 75 MG tablet Take 1 tablet (75 mg total) by mouth daily with breakfast.  . ELDERBERRY PO Take 1 tablet by mouth 3 (three) times a week.  Marland Kitchen FLUoxetine (PROZAC) 20 MG capsule Take 1 capsule (20 mg total) by mouth daily.  . furosemide (LASIX) 20 MG tablet Take 1 tablet (20 mg total) by mouth daily  for 30 days. (Patient taking differently: Take 20 mg by mouth daily. Daily)  . LORazepam (ATIVAN) 1 MG tablet Take 0.5-1 tablets (0.5-1 mg total) by mouth daily as needed for anxiety.  Marland Kitchen losartan (COZAAR) 100 MG tablet Take 1 tablet (100 mg total) by mouth  daily.  . metoprolol tartrate (LOPRESSOR) 25 MG tablet Take 1 tablet (25 mg total) by mouth 2 (two) times daily.  . Multiple Vitamin (MULTIVITAMIN) tablet Take 1 tablet by mouth daily.  . Multiple Vitamins-Minerals (EMERGEN-C IMMUNE PO) Take 1 tablet by mouth every other day.   . pantoprazole (PROTONIX) 20 MG tablet Take 1 tablet (20 mg total) by mouth 2 (two) times daily.    Allergies: Patient has no known allergies.  Social History   Tobacco Use  . Smoking status: Never Smoker  . Smokeless tobacco: Never Used  Substance Use Topics  . Alcohol use: Yes    Alcohol/week: 5.0 standard drinks    Types: 5 Glasses of wine per week    Comment: OCCASIONAL  . Drug use: No    Family History  Adopted: Yes  Problem Relation Age of Onset  . Heart attack Mother 41  . Pancreatic cancer Father        died in WWII - pt adopted  . Hypertension Sister   . Hyperlipidemia Sister   . Hypertension Brother   . Hyperlipidemia Brother   . Prostate cancer Neg Hx   . Kidney cancer Neg Hx   . Bladder Cancer Neg Hx     Review of Systems: A 12-system review of systems was performed and was negative except as noted in the HPI.  --------------------------------------------------------------------------------------------------  Physical Exam: BP 128/86 (BP Location: Left Arm, Patient Position: Sitting, Cuff Size: Large)   Pulse 69   Ht 5\' 7"  (1.702 m)   Wt 259 lb 8 oz (117.7 kg)   SpO2 98%   BMI 40.64 kg/m   General: NAD. HEENT: No conjunctival pallor or scleral icterus. Facemask in place. Neck: Supple without lymphadenopathy, thyromegaly, JVD, or HJR. Lungs: Normal work of breathing. Clear to auscultation bilaterally without wheezes or crackles. Heart: Regular rate and rhythm without murmurs, rubs, or gallops. Abd: Bowel sounds present. Soft, NT/ND. Ext: Trace pretibial edema bilaterally. Skin: Warm and dry without rash.  EKG: Normal sinus rhythm with baseline artifact. Nonspecific ST/T  changes. No significant change from prior tracing on 08/19/2019, allowing for artifact.  Lab Results  Component Value Date   WBC 7.1 12/18/2019   HGB 13.6 12/18/2019   HCT 40.9 12/18/2019   MCV 103.8 (H) 12/18/2019   PLT 242 12/18/2019    Lab Results  Component Value Date   NA 140 12/18/2019   K 4.4 12/18/2019   CL 101 12/18/2019   CO2 30 12/18/2019   BUN 19 12/18/2019   CREATININE 0.89 12/18/2019   GLUCOSE 112 (H) 12/18/2019   ALT 27 12/18/2019    Lab Results  Component Value Date   CHOL 151 08/19/2019   HDL 82 08/19/2019   LDLCALC 55 08/19/2019   TRIG 72 08/19/2019   CHOLHDL 1.8 08/19/2019    --------------------------------------------------------------------------------------------------  ASSESSMENT AND PLAN: Coronary artery disease: Jesse Norton continues to do well following two-vessel PCI last year. We have discussed risks and benefits of long-term dual antiplatelet therapy and have agreed to continue with aspirin and clopidogrel indefinitely. Clopidogrel can be temporarily discontinued for elective procedures, if necessary.  Hypertension: Blood pressure borderline today with diastolic pressure of 86 mmHg. We discussed medication changes but  have agreed to defer any titration today. I encouraged Jesse Norton to continue limiting his sodium intake and to work on lifestyle modifications.  Hyperlipidemia: Most recent LDL was at goal at 55. We will continue with atorvastatin 40 mg daily.  Morbid obesity: BMI remains greater than 40 with multiple comorbidities (CAD, hypertension, and hyperlipidemia). Jesse Norton recently modified his diet and I encouraged him to keep working with diet and exercise to lose weight.  Follow-up: Return to clinic in 6 months.  Nelva Bush, MD 02/18/2020 2:58 PM

## 2020-03-14 IMAGING — US US SCROTUM W/ DOPPLER COMPLETE
1 series · 13 of 25 positions shown · non-contrast
Comparison: None in PACs

CLINICAL DATA: Spermatocele

EXAM:
SCROTAL ULTRASOUND
DOPPLER ULTRASOUND OF THE TESTICLES
TECHNIQUE: Complete ultrasound examination of the testicles, epididymis, and
other scrotal structures was performed. Color and spectral Doppler
ultrasound were also utilized to evaluate blood flow to the
testicles.

[Series 1: us scrotum w/ doppler complete · 0.06mm/px · 86 acquisitions, 13 frames shown]
[im 1/86]
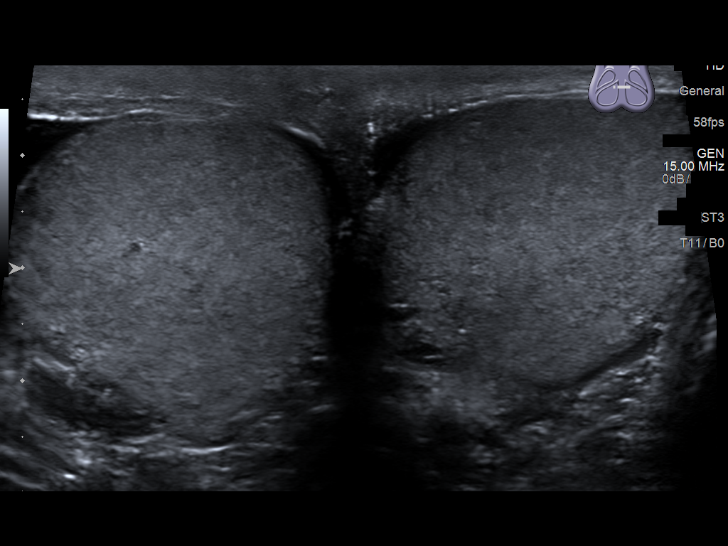
[im 8/86]
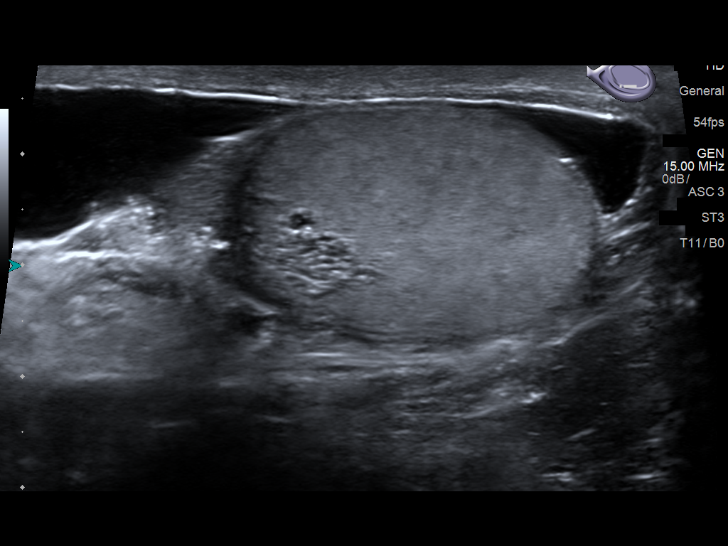
[im 15/86]
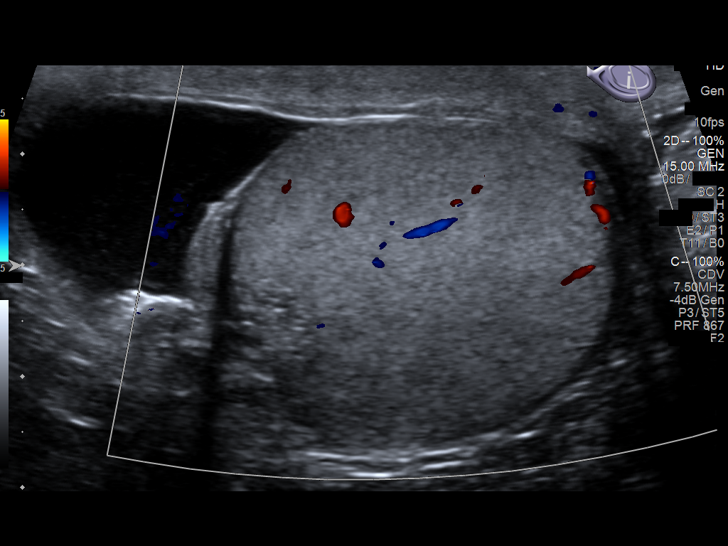
[im 22/86]
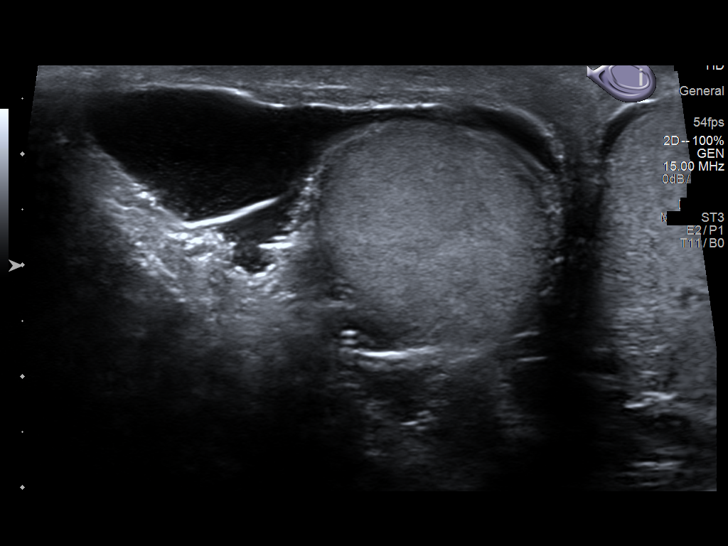
[im 29/86]
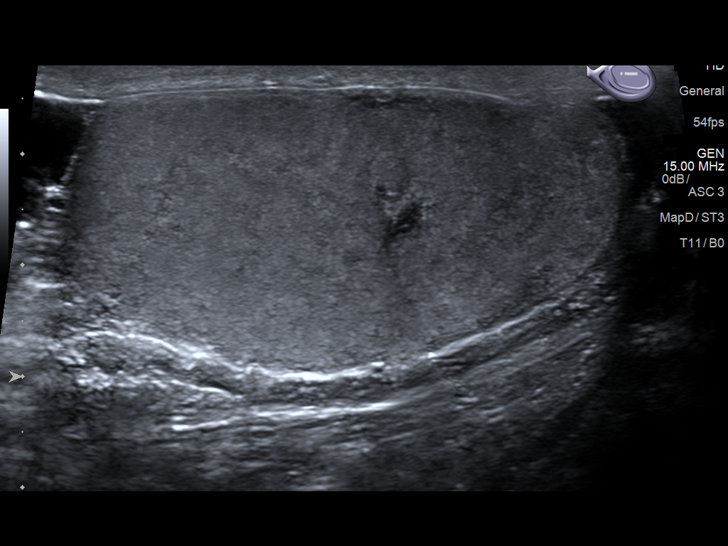
[im 36/86]
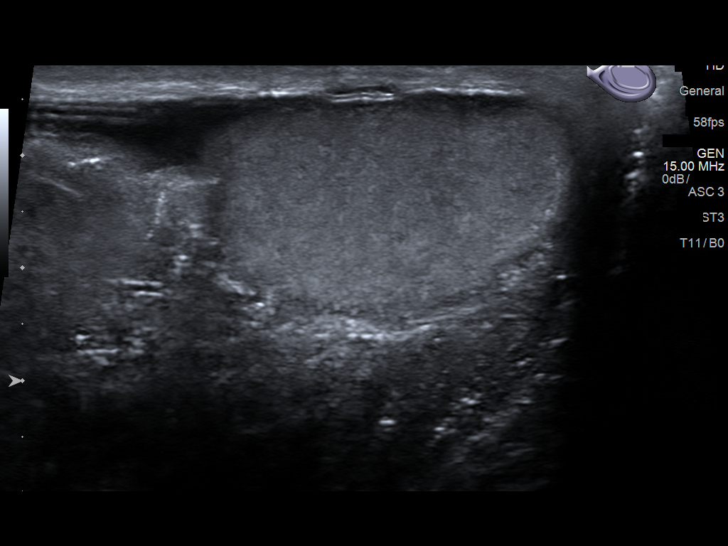
[im 43/86]
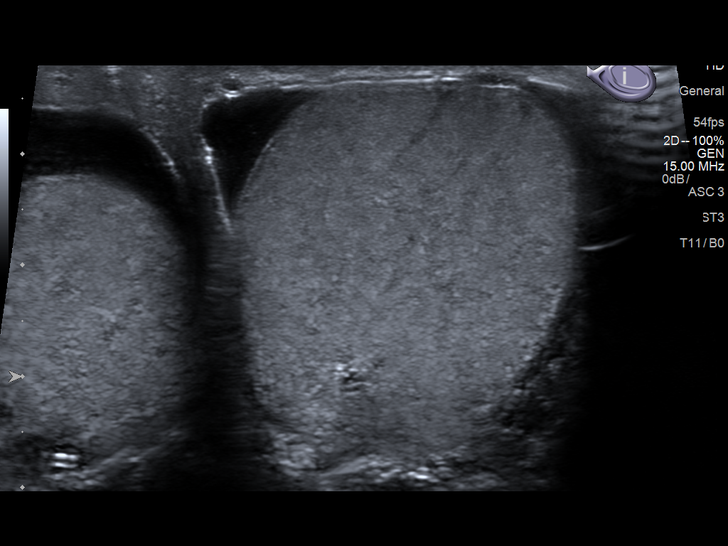
[im 50/86]
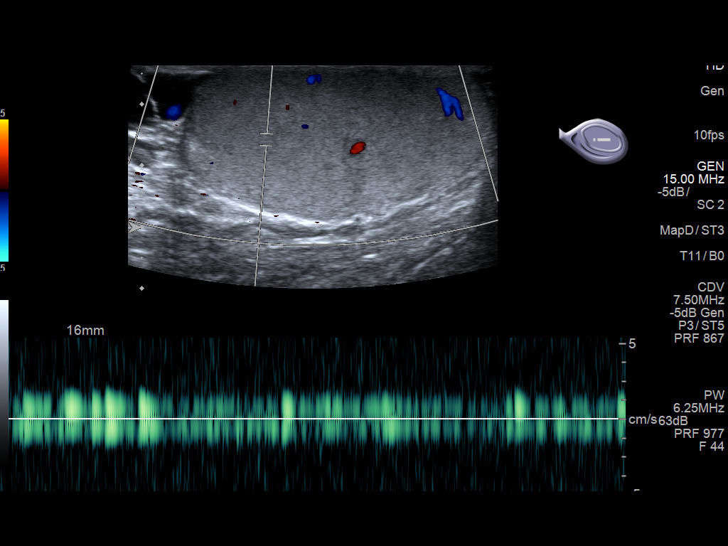
[im 57/86]
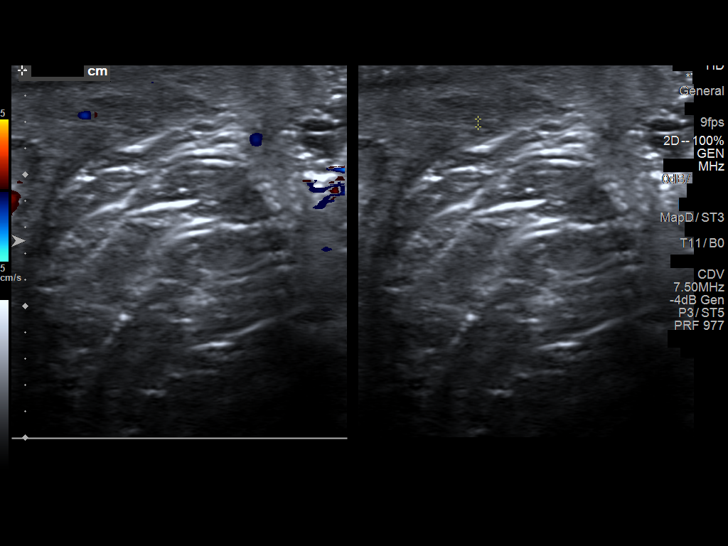
[im 64/86]
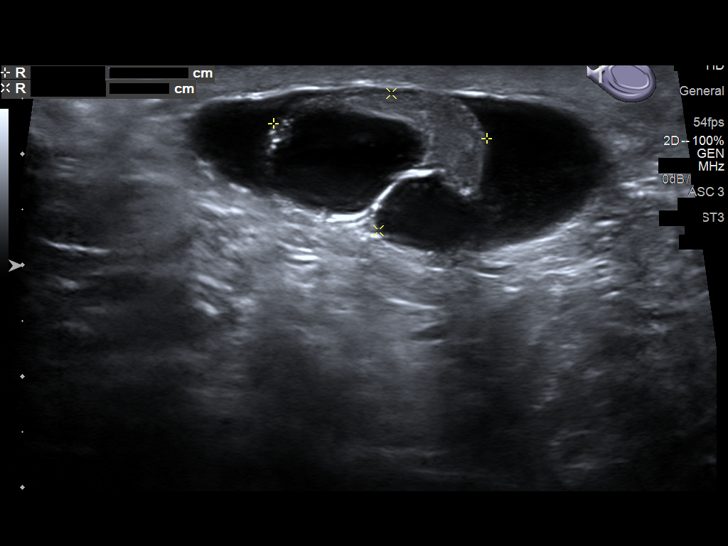
[im 71/86]
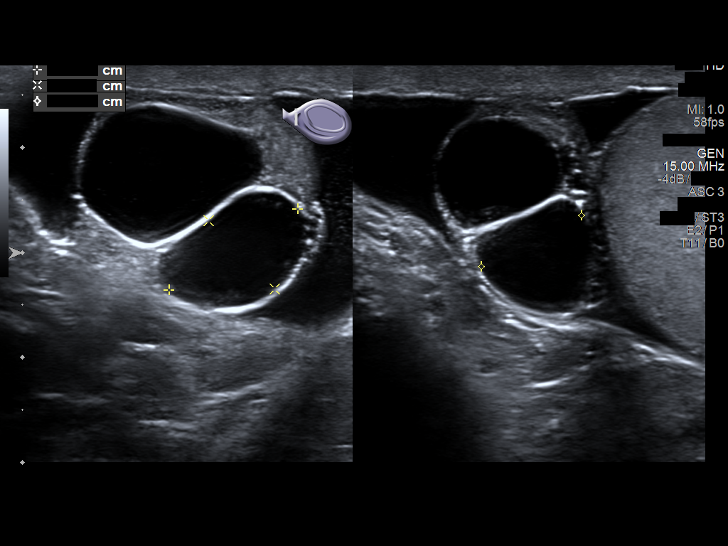
[im 78/86]
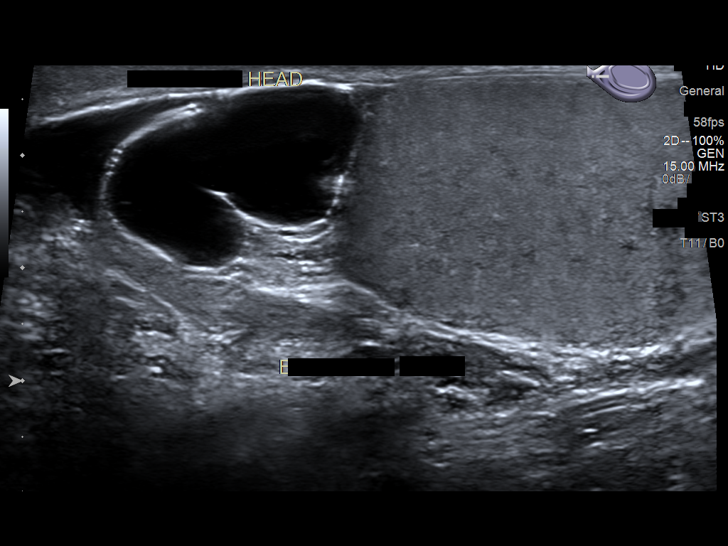
[im 86/86]
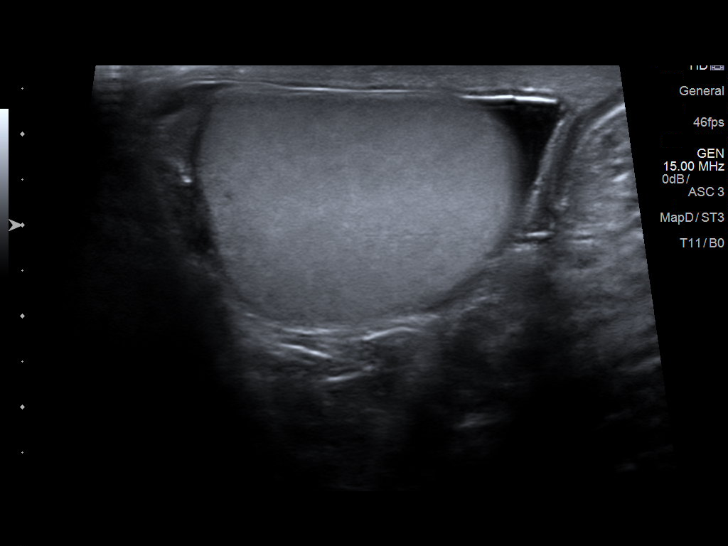

[13 of 25 positions shown; findings below may reference images not displayed]

FINDINGS: Right testicle

Measurements: 5.0 x 2.6 x 3.4 cm. No mass or microlithiasis
visualized.

Left testicle

Measurements: 5.0 x 2.5 x 3.1 cm. No mass or microlithiasis
visualized.

Right epididymis: There are multiple cystic structures within the
epididymis. The 2 largest measure 1.5 x 1.3 x 1.6 cm and 1.1 x 0.9 x
1.9 cm.

Left epididymis: There multiple cystic structures within the left
epididymis. The largest measures 2.8 x 2.1 x 2.0 cm.

Hydrocele: There are bilateral hydroceles. The hydrocele on the left
exhibits septations.

Varicocele:  None visualized.

Pulsed Doppler interrogation of both testes demonstrates normal low
resistance arterial and venous waveforms bilaterally.
IMPRESSION: Normal echotexture and Doppler characteristics of the testes.

Multiple cystic structures arising from the epididymal tissues
compatible with spermatoceles.

Bilateral hydroceles.

## 2020-03-29 ENCOUNTER — Other Ambulatory Visit: Payer: Self-pay | Admitting: *Deleted

## 2020-03-29 MED ORDER — FUROSEMIDE 20 MG PO TABS: 20.0000 mg | ORAL_TABLET | Freq: Every day | ORAL | 2 refills | Status: DC

## 2020-03-30 ENCOUNTER — Telehealth: Payer: Self-pay | Admitting: Internal Medicine

## 2020-03-30 NOTE — Telephone Encounter (Signed)
Pt reports he is doing well today and has no complaints outside of HTN.  Routing to provider to further advise.

## 2020-03-30 NOTE — Telephone Encounter (Signed)
Pain sounds very atypical.  I suggest he continue to monitor his symptoms.  If he has any further episodes of transient chest pain, he should let us know so that we can reevaluate him in the office.  If chest pain does not resolve promptly when it recurs, he should call 911 for further evaluation.  Nelva Bush, MD [facility]

## 2020-03-30 NOTE — Telephone Encounter (Signed)
Pt c/o BP issue: STAT if pt c/o blurred vision, one-sided weakness or slurred speech  1. What are your last 5 BP readings?  THIS MORNING 162/86 4/15-153/81 4/7-160/82  2. Are you having any other symptoms (ex. Dizziness, headache, blurred vision, passed out)? SOME DISCOMFORT LAST NIGHT THAT LASTED FOR A FEW MINUTES  3. What is your BP issue?

## 2020-03-30 NOTE — Telephone Encounter (Signed)
Pt reports gradual inc in BP over the past 3 weeks.   VS THIS MORNING 162/86, HR 68 4/15-153/81, HR 72 4/7-160/82, HR 75  Woke at 3 am to go to bathroom and experienced chest discomfort that lasted a few minutes when taking deep breaths. Had workout today and had no complaints during that time.   It sounds like Harriette Bouillon had spoke to patient as well. Will let her proceed with note.

## 2020-03-31 NOTE — Telephone Encounter (Signed)
Patient verbalized understanding and will let us know if the chest pain recurs and BP remains elevated.

## 2020-05-20 ENCOUNTER — Encounter: Payer: Self-pay | Admitting: Nurse Practitioner

## 2020-06-11 ENCOUNTER — Ambulatory Visit (INDEPENDENT_AMBULATORY_CARE_PROVIDER_SITE_OTHER): Payer: Medicare Other

## 2020-06-11 VITALS — Ht 71.0 in | Wt 250.0 lb

## 2020-06-11 DIAGNOSIS — Z0000 Encounter for general adult medical examination without abnormal findings: Secondary | ICD-10-CM | POA: Diagnosis not present

## 2020-06-11 NOTE — Progress Notes (Signed)
I connected with Jesse Norton today by telephone and verified that I am speaking with the correct person using two identifiers. Location patient: home Location provider: work Persons participating in the virtual visit: Jesse Norton, Jesse Norton.   I discussed the limitations, risks, security and privacy concerns of performing an evaluation and management service by telephone and the availability of in person appointments. I also discussed with the patient that there may be a patient responsible charge related to this service. The patient expressed understanding and verbally consented to this telephonic visit.    Interactive audio and video telecommunications were attempted between this provider and patient, however failed, due to patient having technical difficulties OR patient did not have access to video capability.  We continued and completed visit with audio only.    Vital signs may be patient reported or missing.  Subjective:   Jesse Norton is a 74 y.o. male who presents for Medicare Annual/Subsequent preventive examination.  Review of Systems     Cardiac Risk Factors include: advanced age (>61men, >6 women);dyslipidemia;hypertension;male gender     Objective:    Today's Vitals   06/11/20 0858 06/11/20 0859  Weight: 250 lb (113.4 kg)   Height: 5\' 11"  (1.803 m)   PainSc:  7    Body mass index is 34.87 kg/m.  Advanced Directives 06/11/2020 06/04/2019 02/13/2019 02/04/2019 02/03/2019 01/21/2019 01/21/2019  Does Patient Have a Medical Advance Directive? Yes Yes Yes Yes Yes Yes Yes  Type of Paramedic of [place];Living will Living will;Healthcare Power of [place];Living will [place];Living will [place];Living will [place];Living will [place];Living will  Does patient want to make changes to medical advance directive? - - No - Patient  declined No - Patient declined - No - Patient declined No - Patient declined  Copy of [place] in Chart? No - copy requested No - copy requested No - copy requested No - copy requested No - copy requested No - copy requested No - copy requested  Would patient like information on creating a medical advance directive? - - - - - - -    Current Medications (verified) Outpatient Encounter Medications as of 06/11/2020  Medication Sig  . allopurinol (ZYLOPRIM) 300 MG tablet Take 1 tablet (300 mg total) by mouth daily.  Marland Kitchen amLODipine (NORVASC) 10 MG tablet Take 1 tablet (10 mg total) by mouth daily.  Marland Kitchen aspirin EC 81 MG tablet Take 81 mg by mouth daily.  Marland Kitchen atorvastatin (LIPITOR) 40 MG tablet Take 1 tablet (40 mg total) by mouth daily.  . clopidogrel (PLAVIX) 75 MG tablet Take 1 tablet (75 mg total) by mouth daily with breakfast.  . ELDERBERRY PO Take 1 tablet by mouth 3 (three) times a week.  Marland Kitchen FLUoxetine (PROZAC) 20 MG capsule Take 1 capsule (20 mg total) by mouth daily.  Marland Kitchen LORazepam (ATIVAN) 1 MG tablet Take 0.5-1 tablets (0.5-1 mg total) by mouth daily as needed for anxiety.  Marland Kitchen losartan (COZAAR) 100 MG tablet Take 1 tablet (100 mg total) by mouth daily.  . metoprolol tartrate (LOPRESSOR) 25 MG tablet Take 1 tablet (25 mg total) by mouth 2 (two) times daily.  . Multiple Vitamin (MULTIVITAMIN) tablet Take 1 tablet by mouth daily.  . Multiple Vitamins-Minerals (EMERGEN-C IMMUNE PO) Take 1 tablet by mouth every other day.   . pantoprazole (PROTONIX) 20 MG tablet Take 1 tablet (20 mg total) by mouth  2 (two) times daily.  . furosemide (LASIX) 20 MG tablet Take 1 tablet (20 mg total) by mouth daily.   No facility-administered encounter medications on file as of 06/11/2020.    Allergies (verified) Patient has no known allergies.   History: Past Medical History:  Diagnosis Date  . (HFpEF) heart failure with preserved ejection fraction ([place])    a. 01/2019 Cath: EF 55-60% w/ elevated  LVEDP.  Marland Kitchen Anxiety   . Barrett's esophagus   . CAD (coronary artery disease)    a. 10/2018 ETT: 27mm horizontal inflat ST depression, freq PVC's->Intermediate; b. 12/2018 Cardiac CTA: LM nl, LAD FFRct 0.6 in mLAD, LCX FFRct 0.99p, 0.38m, 0.77d, RCA no signif dzs, FFRct 0.89d->Rec cath; c. 01/2019 PCI: LM nl, LAD 40p, 62m, D2 70, LCX 70p (2.75x15 Resolute Onyx DES), RCA min irregs. EF 55-65%. EDP 25-47mmHg; d. 01/2019 PCI: LAD 80-90 (atherectomy & 3x15 Resolute Onyx DES).  . Colon polyps   . Duodenal adenoma   . Gastritis   . GERD (gastroesophageal reflux disease)   . Gout   . H. pylori infection   . H/O urethral stricture   . Hemorrhoids   . Hiatal hernia   . Hypertension   . Stomach ulcer    Past Surgical History:  Procedure Laterality Date  . COLONOSCOPY    . COLONOSCOPY WITH PROPOFOL N/A 08/20/2018   Procedure: COLONOSCOPY WITH PROPOFOL;  Surgeon: Lollie Sails, MD;  Location: [facility] ENDOSCOPY;  Service: Endoscopy;  Laterality: N/A;  . COLONOSCOPY WITH PROPOFOL N/A 12/19/2018   Procedure: COLONOSCOPY WITH PROPOFOL;  Surgeon: Lollie Sails, MD;  Location: [facility] ENDOSCOPY;  Service: Endoscopy;  Laterality: N/A;  . CORONARY ATHERECTOMY N/A 02/03/2019   Procedure: CORONARY ATHERECTOMY;  Surgeon: Nelva Bush, MD;  Location: [place] CV LAB;  Service: Cardiovascular;  Laterality: N/A;  . CORONARY STENT INTERVENTION N/A 01/21/2019   Procedure: CORONARY STENT INTERVENTION;  Surgeon: Nelva Bush, MD;  Location: [place] CV LAB;  Service: Cardiovascular;  Laterality: N/A;  . CORONARY STENT INTERVENTION N/A 02/03/2019   Procedure: CORONARY STENT INTERVENTION;  Surgeon: Nelva Bush, MD;  Location: [place] CV LAB;  Service: Cardiovascular;  Laterality: N/A;  . CYSTOSCOPY WITH URETHRAL DILATATION N/A 08/16/2018   Procedure: CYSTOSCOPY WITH URETHRAL DILATATION;  Surgeon: [name], MD;  Location: [facility] ORS;  Service: Urology;  Laterality: N/A;  .  ESOPHAGOGASTRODUODENOSCOPY N/A 01/29/2017   Procedure: ESOPHAGOGASTRODUODENOSCOPY (EGD);  Surgeon: Lollie Sails, MD;  Location: [facility] ENDOSCOPY;  Service: Endoscopy;  Laterality: N/A;  . ESOPHAGOGASTRODUODENOSCOPY (EGD) WITH PROPOFOL N/A 07/10/2016   Procedure: ESOPHAGOGASTRODUODENOSCOPY (EGD) WITH PROPOFOL;  Surgeon: Lollie Sails, MD;  Location: [facility] ENDOSCOPY;  Service: Endoscopy;  Laterality: N/A;  . ESOPHAGOGASTRODUODENOSCOPY (EGD) WITH PROPOFOL N/A 12/19/2018   Procedure: ESOPHAGOGASTRODUODENOSCOPY (EGD) WITH PROPOFOL;  Surgeon: Lollie Sails, MD;  Location: [facility] ENDOSCOPY;  Service: Endoscopy;  Laterality: N/A;  . gastritis    . h.pylori    . INTRAVASCULAR ULTRASOUND/IVUS N/A 02/03/2019   Procedure: Intravascular Ultrasound/IVUS;  Surgeon: Nelva Bush, MD;  Location: [place] CV LAB;  Service: Cardiovascular;  Laterality: N/A;  . left elbow repair Left   . LEFT HEART CATH AND CORONARY ANGIOGRAPHY Left 01/21/2019   Procedure: LEFT HEART CATH AND CORONARY ANGIOGRAPHY;  Surgeon: Nelva Bush, MD;  Location: [place] CV LAB;  Service: Cardiovascular;  Laterality: Left;  . SPLENECTOMY  1957  . TONSILLECTOMY     Family History  Adopted: Yes  Problem Relation Age of Onset  . Heart attack  Mother 54  . Pancreatic cancer Father        died in WWII - pt adopted  . Hypertension Sister   . Hyperlipidemia Sister   . Hypertension Brother   . Hyperlipidemia Brother   . Prostate cancer Neg Hx   . Kidney cancer Neg Hx   . Bladder Cancer Neg Hx    Social History   Socioeconomic History  . Marital status: Married    Spouse name: Not on file  . Number of children: Not on file  . Years of education: Not on file  . Highest education level: Master's degree (e.g., MA, MS, MEng, MEd, MSW, MBA)  Occupational History  . Occupation: retired   Tobacco Use  . Smoking status: Never Smoker  . Smokeless tobacco: Never Used  Vaping Use  . Vaping Use: Never used  Substance and  Sexual Activity  . Alcohol use: Yes    Alcohol/week: 7.0 standard drinks    Types: 7 Standard drinks or equivalent per week    Comment: moderate  . Drug use: No  . Sexual activity: Yes  Other Topics Concern  . Not on file  Social History Narrative   Rides bicycle with friends       No smoking.  Occasional alcohol.  Lives at home.  Retired Customer service manager.   Social Determinants of Health   Financial Resource Strain: Low Risk   . Difficulty of Paying Living Expenses: Not hard at all  Food Insecurity: No Food Insecurity  . Worried About Charity fundraiser in the Last Year: Never true  . Ran Out of Food in the Last Year: Never true  Transportation Needs: No Transportation Needs  . Lack of Transportation (Medical): No  . Lack of Transportation (Non-Medical): No  Physical Activity: Sufficiently Active  . Days of Exercise per Week: 5 days  . Minutes of Exercise per Session: 60 min  Stress: No Stress Concern Present  . Feeling of Stress : Not at all  Social Connections:   . Frequency of Communication with Friends and Family:   . Frequency of Social Gatherings with Friends and Family:   . Attends Religious Services:   . Active Member of Clubs or Organizations:   . Attends Archivist Meetings:   Marland Kitchen Marital Status:     Tobacco Counseling Counseling given: Not Answered   Clinical Intake:  Pre-visit preparation completed: Yes  Pain : 0-10 Pain Score: 7  Pain Type: Acute pain Pain Location: Leg Pain Orientation: Left Pain Descriptors / Indicators: Other (Comment) (stiff) Pain Onset: Yesterday Pain Frequency: Constant Pain Relieving Factors: heating pad  Pain Relieving Factors: heating pad  Nutritional Status: BMI > 30  Obese Nutritional Risks: None Diabetes: No  How often do you need to have someone help you when you read instructions, pamphlets, or other written materials from your doctor or pharmacy?: 1 - Never What is the last grade level you completed in school?:  graduate degree  Diabetic? no  Interpreter Needed?: No  Information entered by :: NAllen Norton   Activities of Daily Living In your present state of health, do you have any difficulty performing the following activities: 06/11/2020 08/19/2019  Hearing? N N  Vision? N N  Difficulty concentrating or making decisions? N N  Walking or climbing stairs? N N  Dressing or bathing? N N  Doing errands, shopping? N N  Preparing Food and eating ? N -  Using the Toilet? N -  In the past six months, have  you accidently leaked urine? N -  Do you have problems with loss of bowel control? N -  Managing your Medications? N -  Managing your Finances? N -  Housekeeping or managing your Housekeeping? N -  Some recent data might be hidden    Patient Care Team: Venita Lick, NP as PCP - General (Nurse Practitioner) End, Harrell Gave, MD as PCP - Cardiology (Cardiology) Lollie Sails, MD (Inactive) as Consulting Physician (Gastroenterology) Minor, Dalbert Garnet, RN (Inactive) as [place] any recent [place] you may have received from other than [facility] providers in the past year (date may be approximate).     Assessment:   This is a routine wellness examination for General.  Hearing/Vision screen  Hearing Screening   125Hz  250Hz  500Hz  1000Hz  2000Hz  3000Hz  4000Hz  6000Hz  8000Hz   Right ear:           Left ear:           Vision Screening Comments: Regular eye exams. [place] issues and exercise activities discussed: Current Exercise Habits: Home exercise routine, Type of exercise: walking, Time (Minutes): 60, Frequency (Times/Week): 5, Weekly Exercise (Minutes/Week): 300  Goals    . DIET - INCREASE WATER INTAKE     Recommend dirnking at least 6-8 glasses of water a day     . Increase water intake     Recommend drinking at least 4-5 glasses of water a day    . Patient Stated     06/11/2020, wants to lose 20 pounds       Depression Screen PHQ 2/9 Scores 06/11/2020 02/17/2020 09/24/2019 08/19/2019 06/04/2019 02/13/2019 02/12/2019  PHQ - 2 Score 0 0 0 0 1 0 0  PHQ- 9 Score 0 2 0 0 - 1 1    Fall Risk Fall Risk  06/11/2020 06/04/2019 02/13/2019 12/30/2018 06/26/2018  Falls in the past year? 1 0 0 0 No  Comment tripped over dog cage - - - -  Number falls in past yr: 0 - - - -  Injury with Fall? 0 - 0 - -  Risk for fall due to : Medication side effect;History of fall(s);Impaired balance/gait - - - -  Follow up Falls evaluation completed;Education provided;Falls prevention discussed - - Falls evaluation completed -    Any stairs in or around the home? Yes  If so, are there any without handrails? Yes  Home free of loose throw rugs in walkways, pet beds, electrical cords, etc? Yes  Adequate lighting in your home to reduce risk of falls? Yes   ASSISTIVE DEVICES UTILIZED TO PREVENT FALLS:  Life alert? No  Use of a cane, walker or w/c? No  Grab bars in the bathroom? Yes  Shower chair or bench in shower? Yes  Elevated toilet seat or a handicapped toilet? Yes   TIMED UP AND GO:  Was the test performed? No .      Cognitive Function:     6CIT Screen 06/11/2020 08/19/2019 06/04/2019 05/23/2018 05/18/2017  What Year? 0 points 0 points 0 points 0 points 0 points  What month? 0 points 0 points 0 points 0 points 0 points  What time? 0 points 0 points 0 points 0 points 0 points  Count back from 20 0 points 0 points 0 points 0 points 0 points  Months in reverse 0 points 0 points 0 points 0 points 0 points  Repeat phrase 0 points 0 points 0 points 0 points 0 points  Total Score 0 0 0 0 0    Immunizations Immunization History  Administered Date(s) Administered  . Fluad Quad(high Dose 65+) 10/21/2019  . Influenza, High Dose Seasonal PF 11/29/2016, 10/11/2017, 10/28/2018  . Influenza,inj,Quad PF,6+ Mos 11/22/2015  . Influenza-Unspecified 11/10/2014  . PFIZER SARS-COV-2 Vaccination 01/13/2020, 02/03/2020  . Pneumococcal  Conjugate-13 05/12/2014  . Pneumococcal Polysaccharide-23 04/11/2010  . Td 05/12/2014  . Zoster 10/08/2008    TDAP status: Up to date Flu Vaccine status: Up to date Pneumococcal vaccine status: Up to date Covid-19 vaccine status: Completed vaccines  Qualifies for Shingles Vaccine? Yes   Zostavax completed Yes   Shingrix Completed?: No.    Education has been provided regarding the importance of this vaccine. Patient has been advised to call insurance company to determine out of pocket expense if they have not yet received this vaccine. Advised may also receive vaccine at local pharmacy or Health Dept. Verbalized acceptance and understanding.  Screening Tests Health Maintenance  Topic Date Due  . INFLUENZA VACCINE  07/11/2020  . COLONOSCOPY  12/19/2021  . TETANUS/TDAP  05/12/2024  . COVID-19 Vaccine  Completed  . Hepatitis C Screening  Completed  . PNA vac Low Risk Adult  Completed    Health Maintenance  There are no preventive care reminders to display for this patient.  Colorectal cancer screening: Completed 12/19/2018. Repeat every 3 years  Lung Cancer Screening: (Low Dose CT Chest recommended if Age 70-80 years, 30 pack-year currently smoking OR have quit w/in 15years.) does not qualify.   Lung Cancer Screening Referral: no  Additional Screening:  Hepatitis C Screening: does qualify; Completed 10/30/2018  Vision Screening: Recommended annual ophthalmology exams for early detection of glaucoma and other disorders of the eye. Is the patient up to date with their annual eye exam?  Yes  Who is the provider or what is the name of the office in which the patient attends annual eye exams? [facility] If pt is not established with a provider, would they like to be referred to a provider to establish care? No .   Dental Screening: Recommended annual dental exams for proper oral hygiene  Community Resource Referral / Chronic Care Management: CRR required this visit?  No     CCM required this visit?  No      Plan:     I have personally reviewed and noted the following in the patient's chart:   . Medical and social history . Use of alcohol, tobacco or illicit drugs  . Current medications and supplements . Functional ability and status . Nutritional status . Physical activity . Advanced directives . List of other physicians . Hospitalizations, surgeries, and ER visits in previous 12 months . Vitals . Screenings to include cognitive, depression, and falls . Referrals and appointments  In addition, I have reviewed and discussed with patient certain preventive protocols, quality metrics, and best practice recommendations. A written personalized care plan for preventive services as well as general preventive health recommendations were provided to patient.     Kellie Simmering, Norton   0/05/2693   Nurse Notes:

## 2020-06-11 NOTE — Patient Instructions (Signed)
Jesse Norton , Thank you for taking time to come for your Medicare Wellness Visit. I appreciate your ongoing commitment to your health goals. Please review the following plan we discussed and let me know if I can assist you in the future.   Screening recommendations/referrals: Colonoscopy: completed 12/19/2018, due 12/19/2021 Recommended yearly ophthalmology/optometry visit for glaucoma screening and checkup Recommended yearly dental visit for hygiene and checkup  Vaccinations: Influenza vaccine: completed 10/21/2019, due 07/11/2020 Pneumococcal vaccine: completed 05/12/2014 Tdap vaccine: completed 05/12/2014, due 05/12/2024 Shingles vaccine: discussed   Covid-19: 2/23/201, 01/13/2020  Advanced directives: Please bring a copy of your POA (Power of Attorney) and/or Living Will to your next appointment.   Conditions/risks identified: none  Next appointment: Follow up in one year for your annual wellness visit.   Preventive Care 46 Years and Older, Male Preventive care refers to lifestyle choices and visits with your health care provider that can promote health and wellness. What does preventive care include?  A yearly physical exam. This is also called an annual well check.  Dental exams once or twice a year.  Routine eye exams. Ask your health care provider how often you should have your eyes checked.  Personal lifestyle choices, including:  Daily care of your teeth and gums.  Regular physical activity.  Eating a healthy diet.  Avoiding tobacco and drug use.  Limiting alcohol use.  Practicing safe sex.  Taking low doses of aspirin every day.  Taking vitamin and mineral supplements as recommended by your health care provider. What happens during an annual well check? The services and screenings done by your health care provider during your annual well check will depend on your age, overall health, lifestyle risk factors, and family history of disease. Counseling  Your health care  provider may ask you questions about your:  Alcohol use.  Tobacco use.  Drug use.  Emotional well-being.  Home and relationship well-being.  Sexual activity.  Eating habits.  History of falls.  Memory and ability to understand (cognition).  Work and work Statistician. Screening  You may have the following tests or measurements:  Height, weight, and BMI.  Blood pressure.  Lipid and cholesterol levels. These may be checked every 5 years, or more frequently if you are over 44 years old.  Skin check.  Lung cancer screening. You may have this screening every year starting at age 41 if you have a 30-pack-year history of smoking and currently smoke or have quit within the past 15 years.  Fecal occult blood test (FOBT) of the stool. You may have this test every year starting at age 75.  Flexible sigmoidoscopy or colonoscopy. You may have a sigmoidoscopy every 5 years or a colonoscopy every 10 years starting at age 7.  Prostate cancer screening. Recommendations will vary depending on your family history and other risks.  Hepatitis C blood test.  Hepatitis B blood test.  Sexually transmitted disease (STD) testing.  Diabetes screening. This is done by checking your blood sugar (glucose) after you have not eaten for a while (fasting). You may have this done every 1-3 years.  Abdominal aortic aneurysm (AAA) screening. You may need this if you are a current or former smoker.  Osteoporosis. You may be screened starting at age 58 if you are at high risk. Talk with your health care provider about your test results, treatment options, and if necessary, the need for more tests. Vaccines  Your health care provider may recommend certain vaccines, such as:  Influenza vaccine. This  is recommended every year.  Tetanus, diphtheria, and acellular pertussis (Tdap, Td) vaccine. You may need a Td booster every 10 years.  Zoster vaccine. You may need this after age 70.  Pneumococcal  13-valent conjugate (PCV13) vaccine. One dose is recommended after age 1.  Pneumococcal polysaccharide (PPSV23) vaccine. One dose is recommended after age 37. Talk to your health care provider about which screenings and vaccines you need and how often you need them. This information is not intended to replace advice given to you by your health care provider. Make sure you discuss any questions you have with your health care provider. Document Released: 12/24/2015 Document Revised: 08/16/2016 Document Reviewed: 09/28/2015 Elsevier Interactive Patient Education  2017 [place] Prevention in the Home Falls can cause injuries. They can happen to people of all ages. There are many things you can do to make your home safe and to help prevent falls. What can I do on the outside of my home?  Regularly fix the edges of walkways and driveways and fix any cracks.  Remove anything that might make you trip as you walk through a door, such as a raised step or threshold.  Trim any bushes or trees on the path to your home.  Use bright outdoor lighting.  Clear any walking paths of anything that might make someone trip, such as rocks or tools.  Regularly check to see if handrails are loose or broken. Make sure that both sides of any steps have handrails.  Any raised decks and porches should have guardrails on the edges.  Have any leaves, snow, or ice cleared regularly.  Use sand or salt on walking paths during winter.  Clean up any spills in your garage right away. This includes oil or grease spills. What can I do in the bathroom?  Use night lights.  Install grab bars by the toilet and in the tub and shower. Do not use towel bars as grab bars.  Use non-skid mats or decals in the tub or shower.  If you need to sit down in the shower, use a plastic, non-slip stool.  Keep the floor dry. Clean up any water that spills on the floor as soon as it happens.  Remove soap buildup in the  tub or shower regularly.  Attach bath mats securely with double-sided non-slip rug tape.  Do not have throw rugs and other things on the floor that can make you trip. What can I do in the bedroom?  Use night lights.  Make sure that you have a light by your bed that is easy to reach.  Do not use any sheets or blankets that are too big for your bed. They should not hang down onto the floor.  Have a firm chair that has side arms. You can use this for support while you get dressed.  Do not have throw rugs and other things on the floor that can make you trip. What can I do in the kitchen?  Clean up any spills right away.  Avoid walking on wet floors.  Keep items that you use a lot in easy-to-reach places.  If you need to reach something above you, use a strong step stool that has a grab bar.  Keep electrical cords out of the way.  Do not use floor polish or wax that makes floors slippery. If you must use wax, use non-skid floor wax.  Do not have throw rugs and other things on the floor that can make you  trip. What can I do with my stairs?  Do not leave any items on the stairs.  Make sure that there are handrails on both sides of the stairs and use them. Fix handrails that are broken or loose. Make sure that handrails are as long as the stairways.  Check any carpeting to make sure that it is firmly attached to the stairs. Fix any carpet that is loose or worn.  Avoid having throw rugs at the top or bottom of the stairs. If you do have throw rugs, attach them to the floor with carpet tape.  Make sure that you have a light switch at the top of the stairs and the bottom of the stairs. If you do not have them, ask someone to add them for you. What else can I do to help prevent falls?  Wear shoes that:  Do not have high heels.  Have rubber bottoms.  Are comfortable and fit you well.  Are closed at the toe. Do not wear sandals.  If you use a stepladder:  Make sure that it  is fully opened. Do not climb a closed stepladder.  Make sure that both sides of the stepladder are locked into place.  Ask someone to hold it for you, if possible.  Clearly mark and make sure that you can see:  Any grab bars or handrails.  First and last steps.  Where the edge of each step is.  Use tools that help you move around (mobility aids) if they are needed. These include:  Canes.  Walkers.  Scooters.  Crutches.  Turn on the lights when you go into a dark area. Replace any light bulbs as soon as they burn out.  Set up your furniture so you have a clear path. Avoid moving your furniture around.  If any of your floors are uneven, fix them.  If there are any pets around you, be aware of where they are.  Review your medicines with your doctor. Some medicines can make you feel dizzy. This can increase your chance of falling. Ask your doctor what other things that you can do to help prevent falls. This information is not intended to replace advice given to you by your health care provider. Make sure you discuss any questions you have with your health care provider. Document Released: 09/23/2009 Document Revised: 05/04/2016 Document Reviewed: 01/01/2015 Elsevier Interactive Patient Education  2017 [employer].

## 2020-06-17 ENCOUNTER — Inpatient Hospital Stay: Payer: Medicare Other | Attending: Internal Medicine

## 2020-06-17 ENCOUNTER — Other Ambulatory Visit: Payer: Self-pay

## 2020-06-17 ENCOUNTER — Encounter: Payer: Self-pay | Admitting: Internal Medicine

## 2020-06-17 ENCOUNTER — Inpatient Hospital Stay (HOSPITAL_BASED_OUTPATIENT_CLINIC_OR_DEPARTMENT_OTHER): Payer: Medicare Other | Admitting: Internal Medicine

## 2020-06-17 DIAGNOSIS — C884 Extranodal marginal zone B-cell lymphoma of mucosa-associated lymphoid tissue [MALT-lymphoma]: Secondary | ICD-10-CM | POA: Insufficient documentation

## 2020-06-17 DIAGNOSIS — F419 Anxiety disorder, unspecified: Secondary | ICD-10-CM | POA: Diagnosis not present

## 2020-06-17 DIAGNOSIS — I110 Hypertensive heart disease with heart failure: Secondary | ICD-10-CM | POA: Diagnosis not present

## 2020-06-17 DIAGNOSIS — I2510 Atherosclerotic heart disease of native coronary artery without angina pectoris: Secondary | ICD-10-CM

## 2020-06-17 LAB — COMPREHENSIVE METABOLIC PANEL
ALT: 26 U/L (ref 0–44)
AST: 31 U/L (ref 15–41)
Albumin: 4.1 g/dL (ref 3.5–5.0)
Alkaline Phosphatase: 67 U/L (ref 38–126)
Anion gap: 12 (ref 5–15)
BUN: 18 mg/dL (ref 8–23)
CO2: 28 mmol/L (ref 22–32)
Calcium: 9.3 mg/dL (ref 8.9–10.3)
Chloride: 102 mmol/L (ref 98–111)
Creatinine, Ser: 0.92 mg/dL (ref 0.61–1.24)
GFR calc Af Amer: >60 mL/min (ref >60)
GFR calc non Af Amer: >60 mL/min (ref >60)
Glucose, Bld: 118 mg/dL — ABNORMAL HIGH (ref 70–99)
Potassium: 4.1 mmol/L (ref 3.5–5.1)
Sodium: 142 mmol/L (ref 135–145)
Total Bilirubin: 0.9 mg/dL (ref 0.3–1.2)
Total Protein: 7.5 g/dL (ref 6.5–8.1)

## 2020-06-17 LAB — CBC WITH DIFFERENTIAL/PLATELET
Abs Immature Granulocytes: 0.02 10*3/uL (ref 0.00–0.07)
Basophils Absolute: 0.1 10*3/uL (ref 0.0–0.1)
Basophils Relative: 2 %
Eosinophils Absolute: 0.1 10*3/uL (ref 0.0–0.5)
Eosinophils Relative: 3 %
HCT: 37.1 % — ABNORMAL LOW (ref 39.0–52.0)
Hemoglobin: 13.2 g/dL (ref 13.0–17.0)
Immature Granulocytes: 0 %
Lymphocytes Relative: 35 %
Lymphs Abs: 2 10*3/uL (ref 0.7–4.0)
MCH: 35.1 pg — ABNORMAL HIGH (ref 26.0–34.0)
MCHC: 35.6 g/dL (ref 30.0–36.0)
MCV: 98.7 fL (ref 80.0–100.0)
Monocytes Absolute: 0.8 10*3/uL (ref 0.1–1.0)
Monocytes Relative: 14 %
Neutro Abs: 2.6 10*3/uL (ref 1.7–7.7)
Neutrophils Relative %: 46 %
Platelets: 267 10*3/uL (ref 150–400)
RBC: 3.76 MIL/uL — ABNORMAL LOW (ref 4.22–5.81)
RDW: 12.8 % (ref 11.5–15.5)
WBC: 5.6 10*3/uL (ref 4.0–10.5)
nRBC: 0.4 % — ABNORMAL HIGH (ref 0.0–0.2)

## 2020-06-17 LAB — LACTATE DEHYDROGENASE: LDH: 176 U/L (ref 98–192)

## 2020-06-17 NOTE — Progress Notes (Signed)
[place] NOTE  Patient Care Team: Venita Lick, NP as PCP - General (Nurse Practitioner) End, Harrell Gave, MD as PCP - Cardiology (Cardiology) Lollie Sails, MD (Inactive) as Consulting Physician (Gastroenterology) Minor, Dalbert Garnet, RN (Inactive) as [place] Management  CHIEF COMPLAINTS/PURPOSE OF CONSULTATION:  Lymphoma  #  Oncology History Overview Note  # NOV 2019- MALT [II opinion at [facility] path]; s/p sigmoid colon polyp resection [Dr.Skulskie; incidental]; PET scan -NED  # Barrett's esophagus- 2018;#Colonic polyps surveillance/Dr. 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  male is here for follow-up of his MALT lymphoma of the colon/polyp status post resection.  Patient denies any new lumps or bumps.  Appetite is good.  No weight loss but no night sweats.  Positive fatigue.  Review of Systems  Constitutional: Positive for malaise/fatigue. Negative for chills, diaphoresis, fever and weight loss.  HENT: Negative for nosebleeds and sore throat.   Eyes: Negative for double vision.  Respiratory: Negative for cough, hemoptysis, sputum production, shortness of breath and wheezing.   Cardiovascular: Negative for chest pain, palpitations, orthopnea and leg swelling.  Gastrointestinal: Negative for abdominal pain, blood in stool, constipation, diarrhea, heartburn, melena, nausea and vomiting.  Genitourinary: Negative for dysuria, frequency and urgency.  Musculoskeletal:  Negative for back pain and joint pain.  Skin: Negative.  Negative for itching and rash.  Neurological: Negative for dizziness, tingling, focal weakness, weakness and headaches.  Endo/Heme/Allergies: Does not bruise/bleed easily.  Psychiatric/Behavioral: Negative for depression. The patient is not nervous/anxious and does not have insomnia.      MEDICAL HISTORY:  Past Medical History:  Diagnosis Date  . (HFpEF) heart failure with preserved ejection fraction ([place])    a. 01/2019 Cath: EF 55-60% w/ elevated LVEDP.  Marland Kitchen Anxiety   . Barrett's esophagus   . CAD (coronary artery disease)    a. 10/2018 ETT: 61mm horizontal inflat ST depression, freq PVC's->Intermediate; b. 12/2018 Cardiac CTA: LM nl, LAD FFRct 0.6 in mLAD, LCX FFRct 0.99p, 0.6m, 0.77d, RCA no signif dzs, FFRct 0.89d->Rec cath; c. 01/2019 PCI: LM nl, LAD 40p, 42m, D2 70, LCX 70p (2.75x15 Resolute Onyx DES), RCA min irregs. EF 55-65%. EDP 25-72mmHg; d. 01/2019 PCI: LAD 80-90 (atherectomy & 3x15 Resolute Onyx DES).  . Colon polyps   . Duodenal adenoma   . Gastritis   . GERD (gastroesophageal reflux disease)   . Gout   . H. pylori infection   . H/O urethral stricture   . Hemorrhoids   . Hiatal hernia   . Hypertension   . Stomach ulcer     SURGICAL HISTORY: Past Surgical History:  Procedure Laterality Date  . COLONOSCOPY    . COLONOSCOPY WITH PROPOFOL N/A 08/20/2018   Procedure: COLONOSCOPY WITH PROPOFOL;  Surgeon: Lollie Sails, MD;  Location: [facility] ENDOSCOPY;  Service: Endoscopy;  Laterality: N/A;  . COLONOSCOPY WITH PROPOFOL N/A 12/19/2018   Procedure: COLONOSCOPY WITH PROPOFOL;  Surgeon: Lollie Sails, MD;  Location[facility] ENDOSCOPY;  Service:  Endoscopy;  Laterality: N/A;  . CORONARY ATHERECTOMY N/A 02/03/2019   Procedure: CORONARY ATHERECTOMY;  Surgeon: Nelva Bush, MD;  Location: [place] CV LAB;  Service: Cardiovascular;  Laterality: N/A;  . CORONARY STENT INTERVENTION N/A 01/21/2019   Procedure: CORONARY STENT  INTERVENTION;  Surgeon: Nelva Bush, MD;  Location: [place] CV LAB;  Service: Cardiovascular;  Laterality: N/A;  . CORONARY STENT INTERVENTION N/A 02/03/2019   Procedure: CORONARY STENT INTERVENTION;  Surgeon: Nelva Bush, MD;  Location: [place] CV LAB;  Service: Cardiovascular;  Laterality: N/A;  . CYSTOSCOPY WITH URETHRAL DILATATION N/A 08/16/2018   Procedure: CYSTOSCOPY WITH URETHRAL DILATATION;  Surgeon: [name], MD;  Location: [facility] ORS;  Service: Urology;  Laterality: N/A;  . ESOPHAGOGASTRODUODENOSCOPY N/A 01/29/2017   Procedure: ESOPHAGOGASTRODUODENOSCOPY (EGD);  Surgeon: Lollie Sails, MD;  Location: [facility] ENDOSCOPY;  Service: Endoscopy;  Laterality: N/A;  . ESOPHAGOGASTRODUODENOSCOPY (EGD) WITH PROPOFOL N/A 07/10/2016   Procedure: ESOPHAGOGASTRODUODENOSCOPY (EGD) WITH PROPOFOL;  Surgeon: Lollie Sails, MD;  Location: [facility] ENDOSCOPY;  Service: Endoscopy;  Laterality: N/A;  . ESOPHAGOGASTRODUODENOSCOPY (EGD) WITH PROPOFOL N/A 12/19/2018   Procedure: ESOPHAGOGASTRODUODENOSCOPY (EGD) WITH PROPOFOL;  Surgeon: Lollie Sails, MD;  Location: [facility] ENDOSCOPY;  Service: Endoscopy;  Laterality: N/A;  . gastritis    . h.pylori    . INTRAVASCULAR ULTRASOUND/IVUS N/A 02/03/2019   Procedure: Intravascular Ultrasound/IVUS;  Surgeon: Nelva Bush, MD;  Location: [place] CV LAB;  Service: Cardiovascular;  Laterality: N/A;  . left elbow repair Left   . LEFT HEART CATH AND CORONARY ANGIOGRAPHY Left 01/21/2019   Procedure: LEFT HEART CATH AND CORONARY ANGIOGRAPHY;  Surgeon: Nelva Bush, MD;  Location: [place] CV LAB;  Service: Cardiovascular;  Laterality: Left;  . SPLENECTOMY  1957  . TONSILLECTOMY      SOCIAL HISTORY: Social History   Socioeconomic History  . Marital status: Married    Spouse name: Not on file  . Number of children: Not on file  . Years of education: Not on file  . Highest education level: Master's degree (e.g., MA, MS, MEng, MEd,  MSW, MBA)  Occupational History  . Occupation: retired   Tobacco Use  . Smoking status: Never Smoker  . Smokeless tobacco: Never Used  Vaping Use  . Vaping Use: Never used  Substance and Sexual Activity  . Alcohol use: Yes    Alcohol/week: 7.0 standard drinks    Types: 7 Standard drinks or equivalent per week    Comment: moderate  . Drug use: No  . Sexual activity: Yes  Other Topics Concern  . Not on file  Social History Narrative   Rides bicycle with friends       No smoking.  Occasional alcohol.  Lives at home.  Retired Customer service manager.   Social Determinants of Health   Financial Resource Strain: Low Risk   . Difficulty of Paying Living Expenses: Not hard at all  Food Insecurity: No Food Insecurity  . Worried About Charity fundraiser in the Last Year: Never true  . Ran Out of Food in the Last Year: Never true  Transportation Needs: No Transportation Needs  . Lack of Transportation (Medical): No  . Lack of Transportation (Non-Medical): No  Physical Activity: Sufficiently Active  . Days of Exercise per Week: 5 days  . Minutes of Exercise per Session: 60 min  Stress: No Stress Concern Present  . Feeling of Stress : Not at all  Social Connections:   . Frequency of Communication with Friends and Family:   .  Frequency of Social Gatherings with Friends and Family:   . Attends Religious Services:   . Active Member of Clubs or Organizations:   . Attends Archivist Meetings:   Marland Kitchen Marital Status:   Intimate Partner Violence:   . Fear of Current or Ex-Partner:   . Emotionally Abused:   Marland Kitchen Physically Abused:   . Sexually Abused:     FAMILY HISTORY: Family History  Adopted: Yes  Problem Relation Age of Onset  . Heart attack Mother 83  . Pancreatic cancer Father        died in WWII - pt adopted  . Hypertension Sister   . Hyperlipidemia Sister   . Hypertension Brother   . Hyperlipidemia Brother   . Prostate cancer Neg Hx   . Kidney cancer Neg Hx   . Bladder Cancer  Neg Hx     ALLERGIES:  has No Known Allergies.  MEDICATIONS:  Current Outpatient Medications  Medication Sig Dispense Refill  . allopurinol (ZYLOPRIM) 300 MG tablet Take 1 tablet (300 mg total) by mouth daily. 90 tablet 4  . amLODipine (NORVASC) 10 MG tablet Take 1 tablet (10 mg total) by mouth daily. 90 tablet 4  . aspirin EC 81 MG tablet Take 81 mg by mouth daily.    Marland Kitchen atorvastatin (LIPITOR) 40 MG tablet Take 1 tablet (40 mg total) by mouth daily. 90 tablet 4  . clopidogrel (PLAVIX) 75 MG tablet Take 1 tablet (75 mg total) by mouth daily with breakfast. 90 tablet 3  . ELDERBERRY PO Take 1 tablet by mouth 3 (three) times a week.    Marland Kitchen FLUoxetine (PROZAC) 20 MG capsule Take 1 capsule (20 mg total) by mouth daily. 90 capsule 4  . LORazepam (ATIVAN) 1 MG tablet Take 0.5-1 tablets (0.5-1 mg total) by mouth daily as needed for anxiety. 30 tablet 1  . losartan (COZAAR) 100 MG tablet Take 1 tablet (100 mg total) by mouth daily. 90 tablet 3  . metoprolol tartrate (LOPRESSOR) 25 MG tablet Take 1 tablet (25 mg total) by mouth 2 (two) times daily. 180 tablet 3  . Multiple Vitamin (MULTIVITAMIN) tablet Take 1 tablet by mouth daily.    . Multiple Vitamins-Minerals (EMERGEN-C IMMUNE PO) Take 1 tablet by mouth every other day.     . pantoprazole (PROTONIX) 20 MG tablet Take 1 tablet (20 mg total) by mouth 2 (two) times daily. 180 tablet 4  . furosemide (LASIX) 20 MG tablet Take 1 tablet (20 mg total) by mouth daily. 90 tablet 2   No current facility-administered medications for this visit.      Marland Kitchen  PHYSICAL EXAMINATION: ECOG PERFORMANCE STATUS: 0 - Asymptomatic  Vitals:   06/17/20 1032  BP: (!) 160/91  Pulse: 66  Resp: 16  Temp: 98 F (36.7 C)  SpO2: 99%   Filed Weights   06/17/20 1032  Weight: 263 lb 12.8 oz (119.7 kg)    Physical Exam HENT:     Head: Normocephalic and atraumatic.     Mouth/Throat:     Pharynx: No oropharyngeal exudate.  Eyes:     Pupils: Pupils are equal, round,  and reactive to light.  Cardiovascular:     Rate and Rhythm: Normal rate and regular rhythm.  Pulmonary:     Effort: No respiratory distress.     Breath sounds: No wheezing.  Abdominal:     General: Bowel sounds are normal. There is no distension.     Palpations: Abdomen is soft. There is no mass.  Tenderness: There is no abdominal tenderness. There is no guarding or rebound.  Musculoskeletal:        General: No tenderness. Normal range of motion.     Cervical back: Normal range of motion and neck supple.  Skin:    General: Skin is warm.  Neurological:     Mental Status: He is alert and oriented to person, place, and time.  Psychiatric:        Mood and Affect: Affect normal.      LABORATORY DATA:  I have reviewed the data as listed Lab Results  Component Value Date   WBC 5.6 06/17/2020   HGB 13.2 06/17/2020   HCT 37.1 (L) 06/17/2020   MCV 98.7 06/17/2020   PLT 267 06/17/2020   Recent Labs    12/18/19 1023 06/17/20 0951  NA 140 142  K 4.4 4.1  CL 101 102  CO2 30 28  GLUCOSE 112* 118*  BUN 19 18  CREATININE 0.89 0.92  CALCIUM 9.5 9.3  GFRNONAA >60 >60  GFRAA >60 >60  PROT 7.3 7.5  ALBUMIN 4.4 4.1  AST 33 31  ALT 27 26  ALKPHOS 62 67  BILITOT 1.1 0.9    RADIOGRAPHIC STUDIES: I have personally reviewed the radiological images as listed and agreed with the findings in the report. No results found.  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 STABLE.   # Multiple colon polyps/Barrett's surveillance Dr. Gustavo Lah- last EGD/Colo- Jan 2020.  Reviewed the EGD/pathology-no evidence of progression- STABLE.   # CAD s/p stenting [Dr.End ] s/p cardiac rehab-STABLE.   # Fatigue- ? OSA; recommend follow up with PCP.   # DISPOSITION: # follow up in 6 months  [pref] MD/labs- cbc/cmp/ldh-Dr.B    All questions were answered. The patient knows to call the clinic with any problems, questions or concerns.    Cammie Sickle, MD 06/17/2020 12:10 PM

## 2020-06-17 NOTE — Assessment & Plan Note (Signed)
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 STABLE.   # Multiple colon polyps/Barrett's surveillance Dr. Gustavo Lah- last EGD/Colo- Jan 2020.  Reviewed the EGD/pathology-no evidence of progression- STABLE.   # CAD s/p stenting [Dr.End ] s/p cardiac rehab-STABLE.   # Fatigue- ? OSA; recommend follow up with PCP.   # DISPOSITION: # follow up in 6 months [pref] MD/labs- cbc/cmp/ldh-Dr.B

## 2020-06-18 ENCOUNTER — Ambulatory Visit: Payer: Medicare Other | Admitting: Nurse Practitioner

## 2020-06-21 ENCOUNTER — Other Ambulatory Visit (HOSPITAL_COMMUNITY)
Admission: RE | Admit: 2020-06-21 | Discharge: 2020-06-21 | Disposition: A | Discharge status: Home / Self Care | Payer: Medicare Other | Source: Ambulatory Visit | Attending: Nurse Practitioner | Admitting: Nurse Practitioner

## 2020-06-21 ENCOUNTER — Ambulatory Visit (INDEPENDENT_AMBULATORY_CARE_PROVIDER_SITE_OTHER): Payer: Medicare Other | Admitting: Nurse Practitioner

## 2020-06-21 ENCOUNTER — Encounter: Payer: Self-pay | Admitting: Nurse Practitioner

## 2020-06-21 ENCOUNTER — Other Ambulatory Visit: Payer: Self-pay

## 2020-06-21 VITALS — BP 139/80 | HR 70 | Temp 98.0°F | Wt 264.8 lb

## 2020-06-21 DIAGNOSIS — L918 Other hypertrophic disorders of the skin: Secondary | ICD-10-CM

## 2020-06-21 DIAGNOSIS — L820 Inflamed seborrheic keratosis: Secondary | ICD-10-CM | POA: Diagnosis not present

## 2020-06-21 DIAGNOSIS — F339 Major depressive disorder, recurrent, unspecified: Secondary | ICD-10-CM

## 2020-06-21 DIAGNOSIS — I2510 Atherosclerotic heart disease of native coronary artery without angina pectoris: Secondary | ICD-10-CM

## 2020-06-21 DIAGNOSIS — F419 Anxiety disorder, unspecified: Secondary | ICD-10-CM

## 2020-06-21 MED ORDER — SERTRALINE HCL 50 MG PO TABS: 50.0000 mg | ORAL_TABLET | Freq: Every day | ORAL | 3 refills | Status: DC

## 2020-06-21 MED ORDER — LORAZEPAM 1 MG PO TABS: 0.5000 mg | ORAL_TABLET | Freq: Every day | ORAL | 1 refills | Status: DC | PRN

## 2020-06-21 NOTE — Progress Notes (Signed)
BP 139/80 (BP Location: Left Arm, Cuff Size: Normal)   Pulse 70   Temp 98 F (36.7 C) (Oral)   Wt 264 lb 12.8 oz (120.1 kg)   SpO2 97%   BMI 36.93 kg/m    Subjective:    Patient ID: Jesse Norton, male    DOB: 1945-12-22, 74 y.o.   MRN: [id number]  HPI: Jesse Norton is a 74 y.o. male  Chief Complaint  Patient presents with  . Mole Removal    on right shoulder  . Anxiety    pt states he thinks he needs to go up on Fluoxetine, states he feels like it is not working as good anymore    ANXIETY/STRESS Continues on Ativan 0.5 to 1 MG as needed daily and Prozac 20 MG daily.  Feels Prozac is not working as well, did well the first few years and now not as much.  Takes Ativan as needed only, in about a week he may take one a day for two days -- has 10 pills left at this time.  He often gets 4 refills a year. Pt is aware of risks of psychoactive medication use to include increased sedation, respiratory suppression, falls, extrapyramidal movements,  dependence and cardiovascular events.  Pt would like to continue treatment as benefit determined to outweigh risk.  On review of PMP last Ativan fill 05/22/20 for #30 tablets.   Duration:uncontrolled Anxious mood: yes  Excessive worrying: yes Irritability: yes  Sweating: no Nausea: no Palpitations:no Hyperventilation: no Panic attacks: yes Agoraphobia: no  Obscessions/compulsions: no Depressed mood: no Depression screen [facility] 2/9 06/21/2020 06/11/2020 02/17/2020 09/24/2019 08/19/2019  Decreased Interest 0 0 0 0 0  Down, Depressed, Hopeless 0 0 0 0 0  PHQ - 2 Score 0 0 0 0 0  Altered sleeping 1 0 1 0 0  Tired, decreased energy 1 0 1 0 0  Change in appetite 0 0 0 0 0  Feeling bad or failure about yourself  0 0 0 0 0  Trouble concentrating 0 0 0 0 0  Moving slowly or fidgety/restless 0 0 0 0 0  Suicidal thoughts 0 0 0 0 0  PHQ-9 Score 2 0 2 0 0  Difficult doing work/chores Not difficult at all Not difficult at all Not difficult at all  Not difficult at all Not difficult at all  Some recent data might be hidden   Anhedonia: no Weight changes: no Insomnia: yes hard to stay asleep  Hypersomnia: no Fatigue/loss of energy: no Feelings of worthlessness: no Feelings of guilt: no Impaired concentration/indecisiveness: yes Suicidal ideations: no  Crying spells: no Recent Stressors/Life Changes: no   Relationship problems: no   Family stress: no     Financial stress: no    Job stress: no    Recent death/loss: no GAD 7 : Generalized Anxiety Score 06/21/2020 02/17/2020 08/19/2019  Nervous, Anxious, on Edge 1 1 0  Control/stop worrying 1 1 0  Worry too much - different things 1 0 1  Trouble relaxing 0 0 0  Restless 0 0 0  Easily annoyed or irritable 0 0 0  Afraid - awful might happen 0 0 0  Total GAD 7 Score 3 2 1   Anxiety Difficulty Somewhat difficult Not difficult at all Not difficult at all   SKIN TAG To right shoulder, causes discomfort and irritation -- sometimes bleeds when wearing backpack.  Would like removed. Duration: months Location: right shoulder Painful: at times Itching: no Onset: gradual Context: not  changing Associated signs and symptoms: none History of skin cancer: no History of precancerous skin lesions: no Family history of skin cancer: no  Relevant past medical, surgical, family and social history reviewed and updated as indicated. Interim medical history since our last visit reviewed. Allergies and medications reviewed and updated.  Review of Systems  Constitutional: Negative for activity change, diaphoresis, fatigue and fever.  Respiratory: Negative for cough, chest tightness, shortness of breath and wheezing.   Cardiovascular: Negative for chest pain, palpitations and leg swelling.  Gastrointestinal: Negative.   Skin: Negative.        Skin tag present  Neurological: Negative.   Psychiatric/Behavioral: Negative.     Per HPI unless specifically indicated above     Objective:    BP  139/80 (BP Location: Left Arm, Cuff Size: Normal)   Pulse 70   Temp 98 F (36.7 C) (Oral)   Wt 264 lb 12.8 oz (120.1 kg)   SpO2 97%   BMI 36.93 kg/m   Wt Readings from Last 3 Encounters:  06/21/20 264 lb 12.8 oz (120.1 kg)  06/17/20 263 lb 12.8 oz (119.7 kg)  06/11/20 250 lb (113.4 kg)    Physical Exam Vitals and nursing note reviewed.  Constitutional:      General: He is awake. He is not in acute distress.    Appearance: He is well-developed and well-groomed. He is morbidly obese. He is not ill-appearing.  HENT:     Head: Normocephalic and atraumatic.     Right Ear: Hearing normal. No drainage.     Left Ear: Hearing normal. No drainage.  Eyes:     General: Lids are normal.        Right eye: No discharge.        Left eye: No discharge.     Conjunctiva/sclera: Conjunctivae normal.     Pupils: Pupils are equal, round, and reactive to light.  Neck:     Vascular: No carotid bruit.     Trachea: Trachea normal.  Cardiovascular:     Rate and Rhythm: Normal rate and regular rhythm.     Heart sounds: Normal heart sounds, S1 normal and S2 normal. No murmur heard.  No gallop.   Pulmonary:     Effort: Pulmonary effort is normal. No accessory muscle usage or respiratory distress.     Breath sounds: Normal breath sounds.  Abdominal:     General: Bowel sounds are normal.     Palpations: Abdomen is soft.  Musculoskeletal:        General: Normal range of motion.     Cervical back: Normal range of motion and neck supple.     Right lower leg: No edema.     Left lower leg: No edema.  Skin:    General: Skin is warm and dry.     Capillary Refill: Capillary refill takes less than 2 seconds.     Findings: Lesion present.       Neurological:     Mental Status: He is alert and oriented to person, place, and time.  Psychiatric:        Attention and Perception: Attention normal.        Mood and Affect: Mood normal.        Speech: Speech normal.        Behavior: Behavior normal. Behavior  is cooperative.        Thought Content: Thought content normal.    SKIN TAG REMOVAL Procedure: skin tag removal right anterior shoulder  Description:  After verbal consent and patient education area prepped and draped using semi-sterile technique. 3% Lidocaine with epi was injected into site for anesthesia.  A dermablade was used to remove skin tag.  Hemostasis obtained via pressure and silver nitrate application. A bandage was then placed over the site. Skin tag sent for pathology. Complications:  none Post Procedure Instructions: To the ER if any symptoms of erythema or swelling.   Follow Up: PRN  Results for orders placed or performed in visit on 06/17/20  Lactate dehydrogenase  Result Value Ref Range   LDH 176 98 - 192 U/L  Comprehensive metabolic panel  Result Value Ref Range   Sodium 142 135 - 145 mmol/L   Potassium 4.1 3.5 - 5.1 mmol/L   Chloride 102 98 - 111 mmol/L   CO2 28 22 - 32 mmol/L   Glucose, Bld 118 (H) 70 - 99 mg/dL   BUN 18 8 - 23 mg/dL   Creatinine, Ser 0.92 0.61 - 1.24 mg/dL   Calcium 9.3 8.9 - 10.3 mg/dL   Total Protein 7.5 6.5 - 8.1 g/dL   Albumin 4.1 3.5 - 5.0 g/dL   AST 31 15 - 41 U/L   ALT 26 0 - 44 U/L   Alkaline Phosphatase 67 38 - 126 U/L   Total Bilirubin 0.9 0.3 - 1.2 mg/dL   GFR calc non Af Amer >60 >60 mL/min   GFR calc Af Amer >60 >60 mL/min   Anion gap 12 5 - 15  CBC with Differential  Result Value Ref Range   WBC 5.6 4.0 - 10.5 K/uL   RBC 3.76 (L) 4.22 - 5.81 MIL/uL   Hemoglobin 13.2 13.0 - 17.0 g/dL   HCT 37.1 (L) 39 - 52 %   MCV 98.7 80.0 - 100.0 fL   MCH 35.1 (H) 26.0 - 34.0 pg   MCHC 35.6 30.0 - 36.0 g/dL   RDW 12.8 11.5 - 15.5 %   Platelets 267 150 - 400 K/uL   nRBC 0.4 (H) 0.0 - 0.2 %   Neutrophils Relative % 46 %   Neutro Abs 2.6 1.7 - 7.7 K/uL   Lymphocytes Relative 35 %   Lymphs Abs 2.0 0.7 - 4.0 K/uL   Monocytes Relative 14 %   Monocytes Absolute 0.8 0 - 1 K/uL   Eosinophils Relative 3 %   Eosinophils Absolute 0.1 0 - 0  K/uL   Basophils Relative 2 %   Basophils Absolute 0.1 0 - 0 K/uL   Immature Granulocytes 0 %   Abs Immature Granulocytes 0.02 0.00 - 0.07 K/uL      Assessment & Plan:   Problem List Items Addressed This Visit      Other   Acute anxiety    Chronic, ongoing.  At length discussion on risks of long term benzo use, he wishes to continue treatment and reports using minimally.  Refills sent #30 pills with 1 refill.  This often lasts 6 months.  Return in September for annual physical and will need UDS and contract at that time.      Relevant Medications   sertraline (ZOLOFT) 50 MG tablet   LORazepam (ATIVAN) 1 MG tablet   Depression, recurrent (HCC) - Primary    Chronic, ongoing with wishes to change off Prozac.  Denies SI/HI.  Will do direct switch from Prozac to Sertraline which may offer more benefit for both anxiety and depression elements.  Sertraline 50 MG daily script sent.  Follow-up in September for physical, sooner if  worsening mood.      Relevant Medications   sertraline (ZOLOFT) 50 MG tablet   LORazepam (ATIVAN) 1 MG tablet   Morbid obesity (HCC)    BMI 36.93 with underlying cardiac issues.  Recommended eating smaller high protein, low fat meals more frequently and exercising 30 mins a day 5 times a week with a goal of 10-15lb weight loss in the next 3 months. Patient voiced their understanding and motivation to adhere to these recommendations.        Other Visit Diagnoses    Skin tag       Skin tag to right anterior shoulder removed and sent to pathology.   Relevant Orders   Surgical pathology       Follow up plan: Return in about 2 months (around 08/22/2020) for Annual physical due on September 8th or after.

## 2020-06-21 NOTE — Assessment & Plan Note (Signed)
Chronic, ongoing.  At length discussion on risks of long term benzo use, he wishes to continue treatment and reports using minimally.  Refills sent #30 pills with 1 refill.  This often lasts 6 months.  Return in September for annual physical and will need UDS and contract at that time.

## 2020-06-21 NOTE — Patient Instructions (Signed)
Skin Tag, Adult  A skin tag (acrochordon) is a soft, extra growth of skin. Most skin tags are flesh-colored and rarely bigger than a pencil eraser. They commonly form near areas where there are folds in the skin, such as the armpit or groin. Skin tags are not dangerous, and they do not spread from person to person (are not contagious). You may have one skin tag or several. Skin tags do not require treatment. However, your health care provider may recommend removal of a skin tag if it:  Gets irritated from clothing.  Bleeds.  Is visible and unsightly. Your health care provider can remove skin tags with a simple surgical procedure or a procedure that involves freezing the skin tag. Follow these instructions at home:  Watch for any changes in your skin tag. A normal skin tag does not require any other special care at home.  Take over-the-counter and prescription medicines only as told by your health care provider.  Keep all follow-up visits as told by your health care provider. This is important. Contact a health care provider if:  You have a skin tag that: ? Becomes painful. ? Changes color. ? Bleeds. ? Swells.  You develop more skin tags. This information is not intended to replace advice given to you by your health care provider. Make sure you discuss any questions you have with your health care provider. Document Revised: 11/09/2017 Document Reviewed: 12/12/2015 Elsevier Patient Education  2020 Elsevier Inc.  

## 2020-06-21 NOTE — Assessment & Plan Note (Signed)
Chronic, ongoing with wishes to change off Prozac.  Denies SI/HI.  Will do direct switch from Prozac to Sertraline which may offer more benefit for both anxiety and depression elements.  Sertraline 50 MG daily script sent.  Follow-up in September for physical, sooner if worsening mood.

## 2020-06-21 NOTE — Assessment & Plan Note (Signed)
BMI 36.93 with underlying cardiac issues.  Recommended eating smaller high protein, low fat meals more frequently and exercising 30 mins a day 5 times a week with a goal of 10-15lb weight loss in the next 3 months. Patient voiced their understanding and motivation to adhere to these recommendations.

## 2020-06-23 LAB — SURGICAL PATHOLOGY

## 2020-07-07 ENCOUNTER — Other Ambulatory Visit: Payer: Self-pay

## 2020-07-07 MED ORDER — ALLOPURINOL 300 MG PO TABS: 300.0000 mg | ORAL_TABLET | Freq: Every day | ORAL | 0 refills | Status: DC

## 2020-07-07 NOTE — Telephone Encounter (Signed)
Refill request for Allopurinol and New Rx Request  LOV: 06/21/2020 Next Appt: 08/27/2020

## 2020-07-14 ENCOUNTER — Other Ambulatory Visit: Payer: Self-pay

## 2020-07-14 MED ORDER — METOPROLOL TARTRATE 25 MG PO TABS: 25.0000 mg | ORAL_TABLET | Freq: Two times a day (BID) | ORAL | 3 refills | Status: DC

## 2020-07-14 NOTE — Telephone Encounter (Signed)
Patient last seen 06/21/20, last regular appointment in March. Also has f/up scheduled for September.

## 2020-07-23 ENCOUNTER — Other Ambulatory Visit: Payer: Self-pay

## 2020-07-23 MED ORDER — LOSARTAN POTASSIUM 100 MG PO TABS: 100.0000 mg | ORAL_TABLET | Freq: Every day | ORAL | 4 refills | Status: DC

## 2020-07-23 NOTE — Telephone Encounter (Signed)
Patient last seen 06/21/20 and has appointment 08/27/20

## 2020-08-25 ENCOUNTER — Ambulatory Visit (INDEPENDENT_AMBULATORY_CARE_PROVIDER_SITE_OTHER): Payer: Medicare Other | Admitting: Internal Medicine

## 2020-08-25 ENCOUNTER — Encounter: Payer: Self-pay | Admitting: Internal Medicine

## 2020-08-25 ENCOUNTER — Other Ambulatory Visit: Payer: Self-pay

## 2020-08-25 VITALS — BP 132/82 | HR 71 | Ht 70.0 in | Wt 262.0 lb

## 2020-08-25 DIAGNOSIS — I2510 Atherosclerotic heart disease of native coronary artery without angina pectoris: Secondary | ICD-10-CM | POA: Diagnosis not present

## 2020-08-25 DIAGNOSIS — I5032 Chronic diastolic (congestive) heart failure: Secondary | ICD-10-CM

## 2020-08-25 DIAGNOSIS — E785 Hyperlipidemia, unspecified: Secondary | ICD-10-CM

## 2020-08-25 DIAGNOSIS — I10 Essential (primary) hypertension: Secondary | ICD-10-CM

## 2020-08-25 MED ORDER — NITROGLYCERIN 0.4 MG SL SUBL: 0.4000 mg | SUBLINGUAL_TABLET | SUBLINGUAL | 3 refills | Status: DC | PRN

## 2020-08-25 NOTE — Progress Notes (Signed)
Follow-up Outpatient Visit Date: 08/25/2020  Primary Care Provider: [place] [id number]  Chief Complaint: Follow-up coronary artery disease  HPI:  Jesse Norton is a 74 y.o. male with history of coronary artery disease status post PCI to LCx and LAD, HFpEF, hypertension, Barrett's esophagus, MALT-lymphoma of colon, gout, BPH, and obesity, who presents for follow-up of CAD and HFpEF.  I last saw Jesse Norton in March, at which time he was doing well without chest pain and shortness of breath.  Today, Jesse Norton reports he has been doing relatively well.  He noticed some mild shortness of breath during his recent canoeing/backpacking trip to [place].  When going up steep embankment or carrying a heavy canoe, he had mild shortness of breath.  Overall, his dyspnea is better compared with 6 months ago.  He reports very mild (1/10) chest pain every 3 to 4 weeks that occurs randomly and only lasts a second.  He denies palpitations, lightheadedness, and edema.  Home blood pressure has generally been well controlled, with the highest reading in the last 2 months being 135/80.  --------------------------------------------------------------------------------------------------  Cardiovascular History & Procedures: Cardiovascular Problems:  Coronary artery disease  Risk Factors:  Known CAD, hypertension, male gender, obesity, and age greater than 42  Cath/PCI:  PCI (02/03/2019): 1. Severe, heavily calcified mid LAD stenosis (80-90%), unchanged from diagnostic catheterization earlier this month. 2. Widely patent LCx stent. 3. Normal left ventricular filling pressure. 4. Successful orbital atherectomy and IVUS-guided PCI to the mid LAD using a Resolute Onyx 3.0 x 15 mm drug-eluting stent (postdilated to 3.6 mm) with 0% residual stenosis and TIMI-3 flow.   LHC/PCI (01/21/2019): 1. Significant 2-vessel coronary artery disease, including 80-90% mid LAD stenosis with  heavy calcification, as well as 70% proximal LCx stenosis. 2. Normal left ventricular systolic function. 3. Moderately elevated left ventricular filling pressure. 4. Successful PCI to proximal LCx using Resolute Onyx 2.75 x 15 mm drug-eluting stent (postdilated to 3.1 mm proximally) with 0% residual stenosis and TIMI-3 flow.  CV Surgery:  None  EP Procedures and Devices:  None  Non-Invasive Evaluation(s):  Cardiac CTA (12/23/2018): Multivessel CAD with hemodynamically significant disease involving the mid LAD and distal LCx.  Exercise tolerance test (11/06/2018): Intermediate risk study with 1 mm horizontal ST depressions and frequent PVCs during stress.  Recent CV Pertinent Labs: Lab Results  Component Value Date   CHOL 151 08/19/2019   CHOL 124 02/12/2019   HDL 82 08/19/2019   LDLCALC 55 08/19/2019   TRIG 72 08/19/2019   TRIG 58 02/12/2019   CHOLHDL 1.8 08/19/2019   K 4.1 06/17/2020   K 4.0 01/06/2014   BUN 18 06/17/2020   BUN 18 01/28/2019   BUN 22 (H) 01/06/2014   CREATININE 0.92 06/17/2020   CREATININE 1.13 01/06/2014    Past medical and surgical history were reviewed and updated in EPIC.  Current Meds  Medication Sig   allopurinol (ZYLOPRIM) 300 MG tablet Take 1 tablet (300 mg total) by mouth daily.   amLODipine (NORVASC) 10 MG tablet Take 1 tablet (10 mg total) by mouth daily.   aspirin EC 81 MG tablet Take 81 mg by mouth daily.   atorvastatin (LIPITOR) 40 MG tablet Take 1 tablet (40 mg total) by mouth daily.   clopidogrel (PLAVIX) 75 MG tablet Take 1 tablet (75 mg total) by mouth daily with breakfast.   ELDERBERRY PO Take 1 tablet by mouth 3 (three) times a week.   furosemide (  LASIX) 20 MG tablet Take 1 tablet (20 mg total) by mouth daily.   LORazepam (ATIVAN) 1 MG tablet Take 0.5-1 tablets (0.5-1 mg total) by mouth daily as needed for anxiety.   losartan (COZAAR) 100 MG tablet Take 1 tablet (100 mg total) by mouth daily.   metoprolol tartrate  (LOPRESSOR) 25 MG tablet Take 1 tablet (25 mg total) by mouth 2 (two) times daily.   Multiple Vitamin (MULTIVITAMIN) tablet Take 1 tablet by mouth daily.   Multiple Vitamins-Minerals (EMERGEN-C IMMUNE PO) Take 1 tablet by mouth every other day.    pantoprazole (PROTONIX) 20 MG tablet Take 1 tablet (20 mg total) by mouth 2 (two) times daily.   sertraline (ZOLOFT) 50 MG tablet Take 1 tablet (50 mg total) by mouth daily.    Allergies: Patient has no known allergies.  Social History   Tobacco Use   Smoking status: Never Smoker   Smokeless tobacco: Never Used  Vaping Use   Vaping Use: Never used  Substance Use Topics   Alcohol use: Yes    Alcohol/week: 7.0 standard drinks    Types: 7 Standard drinks or equivalent per week    Comment: moderate   Drug use: No    Family History  Adopted: Yes  Problem Relation Age of Onset   Heart attack Mother 43   Pancreatic cancer Father        died in WWII - pt adopted   Hypertension Sister    Hyperlipidemia Sister    Hypertension Brother    Hyperlipidemia Brother    Prostate cancer Neg Hx    Kidney cancer Neg Hx    Bladder Cancer Neg Hx     Review of Systems: A 12-system review of systems was performed and was negative except as noted in the HPI.  --------------------------------------------------------------------------------------------------  Physical Exam: BP 132/82 (BP Location: Left Arm, Patient Position: Sitting, Cuff Size: Normal)    Pulse 71    Ht 5\' 10"  (1.778 m)    Wt 262 lb (118.8 kg)    BMI 37.59 kg/m   General: NAD. HEENT: No conjunctival pallor or scleral icterus. Facemask in place. Neck: No JVD or HJR. Lungs: Normal work of breathing. Clear to auscultation bilaterally without wheezes or crackles. Heart: Regular rate and rhythm without murmurs, rubs, or gallops. Non-displaced PMI. Abd: Bowel sounds present. Soft, NT/ND without hepatosplenomegaly Ext: Trace ankle edema bilaterally. Skin: Warm and dry  without rash.  EKG: Normal sinus rhythm with isolated PVC.  Otherwise, no significant abnormality.  Lab Results  Component Value Date   WBC 5.6 06/17/2020   HGB 13.2 06/17/2020   HCT 37.1 (L) 06/17/2020   MCV 98.7 06/17/2020   PLT 267 06/17/2020    Lab Results  Component Value Date   NA 142 06/17/2020   K 4.1 06/17/2020   CL 102 06/17/2020   CO2 28 06/17/2020   BUN 18 06/17/2020   CREATININE 0.92 06/17/2020   GLUCOSE 118 (H) 06/17/2020   ALT 26 06/17/2020    Lab Results  Component Value Date   CHOL 151 08/19/2019   HDL 82 08/19/2019   LDLCALC 55 08/19/2019   TRIG 72 08/19/2019   CHOLHDL 1.8 08/19/2019    --------------------------------------------------------------------------------------------------  ASSESSMENT AND PLAN: Coronary artery disease: Jesse Norton has not had any anginal chest pain since our last visit.  His fleeting random chest pains are not consistent with coronary insufficiency given their brief, nonexertional nature.  His chronic exertional dyspnea is now present with just strenuous activities and  continues to improve.  We have agreed to continue indefinite dual antiplatelet therapy with aspirin and clopidogrel as well as the remainder of his medications for secondary prevention.  I have also provided him with a prescription for as needed nitroglycerin, should he develop angina.  I advised him to call 911 if he requires more than 1 NTG tablet for relief of his chest pain.  Chronic HFpEF: Jesse Norton appears euvolemic on exam with stable NYHA class II symptoms.  We discussed transitioning furosemide to as needed but have agreed to continue with 20 mg daily dosing.  Hypertension: Blood pressure borderline elevated today, though typically better at home.  Will defer medication changes at this time.  Hyperlipidemia: LDL has been well controlled in the past.  Given that it has been about a year since this was last checked.  We will obtain a lipid panel today  with plans to continue atorvastatin 40 mg daily for target LDL less than 70.  Obesity: Jesse Norton has lost weight since our last visit.  I encouraged him to continue working on this through diet and exercise.  Follow-up: Return to clinic in 6 months.  Nelva Bush, MD 08/25/2020 10:19 AM

## 2020-08-25 NOTE — Patient Instructions (Signed)
Medication Instructions:  Your physician recommends that you continue on your current medications as directed. Please refer to the Current Medication list given to you today.  Nitroglycerin as needed for chest pain - Dissolve 1 tablet (0.4 mg) under your tongue every 5 minutes as needed for chest pain. Do not take more than 3 doses. If chest pain does not resolve, then call 911 or go to the Emergency Room.   *If you need a refill on your cardiac medications before your next appointment, please call your pharmacy*   Lab Work: Your physician recommends that you return for lab work in: TODAY - LIPID.  If you have labs (blood work) drawn today and your tests are completely normal, you will receive your results only by: Marland Kitchen MyChart Message (if you have MyChart) OR . A paper copy in the mail If you have any lab test that is abnormal or we need to change your treatment, we will call you to review the results.   Testing/Procedures: none  Follow-Up: At [facility], you and your health needs are our priority.  As part of our continuing mission to provide you with exceptional heart care, we have created designated Provider Care Teams.  These Care Teams include your primary Cardiologist (physician) and Advanced Practice Providers (APPs -  Physician Assistants and Nurse Practitioners) who all work together to provide you with the care you need, when you need it.  We recommend signing up for the patient portal called "MyChart".  Sign up information is provided on this After Visit Summary.  MyChart is used to connect with patients for Virtual Visits (Telemedicine).  Patients are able to view lab/test results, encounter notes, upcoming appointments, etc.  Non-urgent messages can be sent to your provider as well.   To learn more about what you can do with MyChart, go to [email or web address].    Your next appointment:   6 month(s)  The format for your next appointment:   In Person  Provider:     You may see Nelva Bush, MD or one of the following Advanced Practice Providers on your designated Care Team:    Murray Hodgkins, NP  Christell Faith, PA-C  Marrianne Mood, PA-C

## 2020-08-26 ENCOUNTER — Encounter: Payer: Self-pay | Admitting: Urology

## 2020-08-26 ENCOUNTER — Ambulatory Visit (INDEPENDENT_AMBULATORY_CARE_PROVIDER_SITE_OTHER): Payer: Medicare Other | Admitting: Urology

## 2020-08-26 VITALS — BP 141/90 | HR 80 | Ht 70.0 in | Wt 260.0 lb

## 2020-08-26 DIAGNOSIS — Z87448 Personal history of other diseases of urinary system: Secondary | ICD-10-CM | POA: Diagnosis not present

## 2020-08-26 LAB — LIPID PANEL
Chol/HDL Ratio: 1.9 ratio (ref 0.0–5.0)
Cholesterol, Total: 136 mg/dL (ref 100–199)
HDL: 70 mg/dL (ref >39)
LDL Chol Calc (NIH): 54 mg/dL (ref 0–99)
Triglycerides: 57 mg/dL (ref 0–149)
VLDL Cholesterol Cal: 12 mg/dL (ref 5–40)

## 2020-08-26 LAB — BLADDER SCAN AMB NON-IMAGING

## 2020-08-26 NOTE — Progress Notes (Signed)
   08/26/2020 1:30 PM   Jesse Norton November 10, 1946 [id number]  Reason for visit: Follow up urethral stricture  HPI: I saw Jesse Norton today in urology clinic for follow-up.  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  He was having some bothersome urinary symptoms at that time of weak stream.  He underwent an uncomplicated balloon dilation in the operating room on 08/16/2018, and has done very well since that time.  He continues to urinate with a very strong stream and PVRs have been normal in follow-up.  He denies any urinary complaints today.  PVR is normal again today at 93 mL.  We had a long conversation about his history of urethral stricture, and he has done well over the last 2 years status post balloon dilation.  We discussed return precautions at length including gross hematuria, UTI, or weakening urinary stream, or new urinary symptoms.  Follow-up as needed  [name], MD  [place] [address], [place], [place] [address] [phone]

## 2020-08-27 ENCOUNTER — Encounter: Payer: Medicare Other | Admitting: Nurse Practitioner

## 2020-09-03 ENCOUNTER — Other Ambulatory Visit: Payer: Self-pay

## 2020-09-03 DIAGNOSIS — I10 Essential (primary) hypertension: Secondary | ICD-10-CM

## 2020-09-03 MED ORDER — ATORVASTATIN CALCIUM 40 MG PO TABS
40.0000 mg | ORAL_TABLET | Freq: Every day | ORAL | 4 refills | Status: DC
Start: 2020-09-03 — End: 2021-09-15

## 2020-09-03 MED ORDER — AMLODIPINE BESYLATE 10 MG PO TABS: 10.0000 mg | ORAL_TABLET | Freq: Every day | ORAL | 4 refills | Status: DC

## 2020-09-03 NOTE — Telephone Encounter (Signed)
Patient last seen 06/21/20 and has appointment 09/14/20

## 2020-09-12 ENCOUNTER — Encounter: Payer: Self-pay | Admitting: Nurse Practitioner

## 2020-09-12 DIAGNOSIS — I5032 Chronic diastolic (congestive) heart failure: Secondary | ICD-10-CM | POA: Insufficient documentation

## 2020-09-14 ENCOUNTER — Other Ambulatory Visit: Payer: Self-pay

## 2020-09-14 ENCOUNTER — Ambulatory Visit (INDEPENDENT_AMBULATORY_CARE_PROVIDER_SITE_OTHER): Payer: Medicare Other | Admitting: Nurse Practitioner

## 2020-09-14 ENCOUNTER — Encounter: Payer: Self-pay | Admitting: Nurse Practitioner

## 2020-09-14 VITALS — BP 130/78 | HR 80 | Temp 97.5°F | Ht 71.0 in | Wt 263.4 lb

## 2020-09-14 DIAGNOSIS — F419 Anxiety disorder, unspecified: Secondary | ICD-10-CM

## 2020-09-14 DIAGNOSIS — M1A0720 Idiopathic chronic gout, left ankle and foot, without tophus (tophi): Secondary | ICD-10-CM | POA: Diagnosis not present

## 2020-09-14 DIAGNOSIS — N400 Enlarged prostate without lower urinary tract symptoms: Secondary | ICD-10-CM | POA: Diagnosis not present

## 2020-09-14 DIAGNOSIS — C884 Extranodal marginal zone b-cell lymphoma of mucosa-associated lymphoid tissue (malt-lymphoma) not having achieved remission: Secondary | ICD-10-CM

## 2020-09-14 DIAGNOSIS — E785 Hyperlipidemia, unspecified: Secondary | ICD-10-CM

## 2020-09-14 DIAGNOSIS — F339 Major depressive disorder, recurrent, unspecified: Secondary | ICD-10-CM

## 2020-09-14 DIAGNOSIS — I2510 Atherosclerotic heart disease of native coronary artery without angina pectoris: Secondary | ICD-10-CM

## 2020-09-14 DIAGNOSIS — I10 Essential (primary) hypertension: Secondary | ICD-10-CM | POA: Diagnosis not present

## 2020-09-14 DIAGNOSIS — Z23 Encounter for immunization: Secondary | ICD-10-CM

## 2020-09-14 DIAGNOSIS — D692 Other nonthrombocytopenic purpura: Secondary | ICD-10-CM

## 2020-09-14 DIAGNOSIS — I5032 Chronic diastolic (congestive) heart failure: Secondary | ICD-10-CM

## 2020-09-14 DIAGNOSIS — K22719 Barrett's esophagus with dysplasia, unspecified: Secondary | ICD-10-CM

## 2020-09-14 MED ORDER — PANTOPRAZOLE SODIUM 20 MG PO TBEC: 20.0000 mg | DELAYED_RELEASE_TABLET | Freq: Two times a day (BID) | ORAL | 4 refills | Status: DC

## 2020-09-14 MED ORDER — LORAZEPAM 1 MG PO TABS: 0.5000 mg | ORAL_TABLET | Freq: Every day | ORAL | 1 refills | Status: DC | PRN

## 2020-09-14 MED ORDER — CLOPIDOGREL BISULFATE 75 MG PO TABS
75.0000 mg | ORAL_TABLET | Freq: Every day | ORAL | 4 refills | Status: DC
Start: 2020-09-14 — End: 2020-09-27

## 2020-09-14 NOTE — Assessment & Plan Note (Signed)
Chronic, ongoing.  At length discussion on risks of long term benzo use, he wishes to continue treatment and reports using minimally.  Refills sent #30 pills with 1 refill.  This often lasts 6 months.  Return in 6 months and will need UDS and contract at that time.

## 2020-09-14 NOTE — Assessment & Plan Note (Signed)
Chronic, ongoing.  Continue current medication and adjust as needed.  Lipid panel today.

## 2020-09-14 NOTE — Assessment & Plan Note (Signed)
BMI 36.74 with underlying cardiac issues.  Recommended eating smaller high protein, low fat meals more frequently and exercising 30 mins a day 5 times a week with a goal of 10-15lb weight loss in the next 3 months. Patient voiced their understanding and motivation to adhere to these recommendations.

## 2020-09-14 NOTE — Assessment & Plan Note (Signed)
Recommend gentle skin care at home and monitor for abrasions or skin breakdown, alert provider if present. 

## 2020-09-14 NOTE — Patient Instructions (Signed)

## 2020-09-14 NOTE — Assessment & Plan Note (Signed)
Chronic, stable.  Continue collaboration with oncology, recent notes reviewed.

## 2020-09-14 NOTE — Assessment & Plan Note (Signed)
Chronic, stable.  Denies SI/HI.  Continue current medication regimen and adjust as needed, benefit obtained from change to Zoloft.  Recommend relaxation and meditation techniques at home.  Return in 6 months.

## 2020-09-14 NOTE — Assessment & Plan Note (Signed)
Chronic, ongoing,  Well-controlled without medication at this time.  Continue to monitor, check PSA today. 

## 2020-09-14 NOTE — Progress Notes (Signed)
BP 130/78    Pulse 80    Temp (!) 97.5 F (36.4 C) (Oral)    Ht 5\' 11"  (1.803 m)    Wt 263 lb 6.4 oz (119.5 kg)    SpO2 96%    BMI 36.74 kg/m    Subjective:    Patient ID: Jesse Norton, male    DOB: Mar 29, 1946, 74 y.o.   MRN: [id number]  HPI: Jesse Norton is a 74 y.o. male presenting on 09/14/2020 for examination. Current medical complaints include:none  He currently lives with: wife Interim Problems from his last visit: no   HYPERTENSION / HYPERLIPIDEMIA/HF Continues on Losartan, Metoprolol, Lasix, Amlodipine, and Plavix + ASA.  Followed by cardiology for CAD, HFpEF and HTN, last seen 08/25/20.  No recent NTG use.  Continues on Allopurinol daily for history of gout flares, last uric acid 3.1. Satisfied with current treatment? yes Duration of hypertension: chronic BP monitoring frequency: daily BP range: 125/77 to 132/82 range at home over past month BP medication side effects: no Duration of hyperlipidemia: chronic Cholesterol medication side effects: no Cholesterol supplements: none Medication compliance: good compliance Aspirin: yes Recent stressors: no Recurrent headaches: no Visual changes: no Palpitations: no Dyspnea: no Chest pain: no Lower extremity edema: no Dizzy/lightheaded: no   B-CELL LYMPHOMA: Followed by oncology and last seen 06/17/20.  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  Remains stable on review records, they did question OSA and recommended discuss with PCP -- discussed with patient he denies snoring or morning headaches, does not wish to pursue sleep study.  Continues on Protonix for Barrett's esophagus.  Denies B symptoms.    ANXIETY/STRESS Continues on Ativan 0.5 to 1 MG as needed daily and Sertraline 50 MG (changed to this last visit as he felt Prozac no longer worked) -- he reports this works a Dietitian better. Takes Ativanas needed only, in about a week he may take one  a day for two days. He often gets 4 refills a year. Pt isaware of risks of psychoactive medication use to include increased sedation, respiratory suppression, falls, extrapyramidal movements, dependence and cardiovascular events. Ptwould like to continue treatment as benefit determined to outweigh risk.On review of PMP last Ativan fill 08/23/20 for #30 tablets. Duration:uncontrolled Anxious mood: yes  Excessive worrying: yes Irritability: yes  Sweating: no Nausea: no Palpitations:no Hyperventilation: no Panic attacks: yes Agoraphobia: no  Obscessions/compulsions: no Depressed mood: no Depression screen [facility] 2/9 09/14/2020 06/21/2020 06/11/2020 02/17/2020 09/24/2019  Decreased Interest 0 0 0 0 0  Down, Depressed, Hopeless 0 0 0 0 0  PHQ - 2 Score 0 0 0 0 0  Altered sleeping 0 1 0 1 0  Tired, decreased energy 0 1 0 1 0  Change in appetite 0 0 0 0 0  Feeling bad or failure about yourself  0 0 0 0 0  Trouble concentrating 0 0 0 0 0  Moving slowly or fidgety/restless 0 0 0 0 0  Suicidal thoughts 0 0 0 0 0  PHQ-9 Score 0 2 0 2 0  Difficult doing work/chores Not difficult at all Not difficult at all Not difficult at all Not difficult at all Not difficult at all  Some recent data might be hidden   GAD 7 : Generalized Anxiety Score 09/14/2020 06/21/2020 02/17/2020 08/19/2019  Nervous, Anxious, on Edge 0 1 1 0  Control/stop worrying 0 1 1 0  Worry too much - different things 0  1 0 1  Trouble relaxing 0 0 0 0  Restless 0 0 0 0  Easily annoyed or irritable 0 0 0 0  Afraid - awful might happen 0 0 0 0  Total GAD 7 Score 0 3 2 1   Anxiety Difficulty Not difficult at all Somewhat difficult Not difficult at all Not difficult at all   Functional Status Survey: Is the patient deaf or have difficulty hearing?: No Does the patient have difficulty seeing, even when wearing glasses/contacts?: No Does the patient have difficulty concentrating, remembering, or making decisions?: No Does the patient have  difficulty walking or climbing stairs?: No Does the patient have difficulty dressing or bathing?: No Does the patient have difficulty doing errands alone such as visiting a doctor's office or shopping?: No  FALL RISK: Fall Risk  09/14/2020 06/11/2020 06/04/2019 02/13/2019 12/30/2018  Falls in the past year? 1 1 0 0 0  Comment - tripped over dog cage - - -  Number falls in past yr: 1 0 - - -  Injury with Fall? 0 0 - 0 -  Risk for fall due to : No Fall Risks Medication side effect;History of fall(s);Impaired balance/gait - - -  Follow up Falls evaluation completed Falls evaluation completed;Education provided;Falls prevention discussed - - Falls evaluation completed    Depression Screen Depression screen [facility] 2/9 09/14/2020 06/21/2020 06/11/2020 02/17/2020 09/24/2019  Decreased Interest 0 0 0 0 0  Down, Depressed, Hopeless 0 0 0 0 0  PHQ - 2 Score 0 0 0 0 0  Altered sleeping 0 1 0 1 0  Tired, decreased energy 0 1 0 1 0  Change in appetite 0 0 0 0 0  Feeling bad or failure about yourself  0 0 0 0 0  Trouble concentrating 0 0 0 0 0  Moving slowly or fidgety/restless 0 0 0 0 0  Suicidal thoughts 0 0 0 0 0  PHQ-9 Score 0 2 0 2 0  Difficult doing work/chores Not difficult at all Not difficult at all Not difficult at all Not difficult at all Not difficult at all  Some recent data might be hidden    Advanced Directives <no information>  Past Medical History:  Past Medical History:  Diagnosis Date   (HFpEF) heart failure with preserved ejection fraction ([place])    a. 01/2019 Cath: EF 55-60% w/ elevated LVEDP.   Anxiety    Barrett's esophagus    CAD (coronary artery disease)    a. 10/2018 ETT: 28mm horizontal inflat ST depression, freq PVC's->Intermediate; b. 12/2018 Cardiac CTA: LM nl, LAD FFRct 0.6 in mLAD, LCX FFRct 0.99p, 0.106m, 0.77d, RCA no signif dzs, FFRct 0.89d->Rec cath; c. 01/2019 PCI: LM nl, LAD 40p, 25m, D2 70, LCX 70p (2.75x15 Resolute Onyx DES), RCA min irregs. EF 55-65%. EDP 25-40mmHg;  d. 01/2019 PCI: LAD 80-90 (atherectomy & 3x15 Resolute Onyx DES).   Colon polyps    Duodenal adenoma    Gastritis    GERD (gastroesophageal reflux disease)    Gout    H. pylori infection    H/O urethral stricture    Hemorrhoids    Hiatal hernia    Hypertension    Stomach ulcer     Surgical History:  Past Surgical History:  Procedure Laterality Date   COLONOSCOPY     COLONOSCOPY WITH PROPOFOL N/A 08/20/2018   Procedure: COLONOSCOPY WITH PROPOFOL;  Surgeon: Lollie Sails, MD;  Location: [facility] ENDOSCOPY;  Service: Endoscopy;  Laterality: N/A;   COLONOSCOPY WITH PROPOFOL N/A  12/19/2018   Procedure: COLONOSCOPY WITH PROPOFOL;  Surgeon: Lollie Sails, MD;  Location: [facility] ENDOSCOPY;  Service: Endoscopy;  Laterality: N/A;   CORONARY ATHERECTOMY N/A 02/03/2019   Procedure: CORONARY ATHERECTOMY;  Surgeon: Nelva Bush, MD;  Location: [place] CV LAB;  Service: Cardiovascular;  Laterality: N/A;   CORONARY STENT INTERVENTION N/A 01/21/2019   Procedure: CORONARY STENT INTERVENTION;  Surgeon: Nelva Bush, MD;  Location: [place] CV LAB;  Service: Cardiovascular;  Laterality: N/A;   CORONARY STENT INTERVENTION N/A 02/03/2019   Procedure: CORONARY STENT INTERVENTION;  Surgeon: Nelva Bush, MD;  Location: [place] [facility] LAB;  Service: Cardiovascular;  Laterality: N/A;   CYSTOSCOPY WITH URETHRAL DILATATION N/A 08/16/2018   Procedure: CYSTOSCOPY WITH URETHRAL DILATATION;  Surgeon: [name], MD;  Location: [facility] ORS;  Service: Urology;  Laterality: N/A;   ESOPHAGOGASTRODUODENOSCOPY N/A 01/29/2017   Procedure: ESOPHAGOGASTRODUODENOSCOPY (EGD);  Surgeon: Lollie Sails, MD;  Location: [facility] END[facility]COPY;  Service: Endoscopy;  Laterality: N/A;   ESOPHAGOGASTRODUODENOSCOPY (EGD) WITH PROPOFOL N/A 07/10/2016   Procedure: ESOPHAGOGASTRODUODENOSCOPY (EGD) WITH PROPOFOL;  Surgeon: Lollie Sails, MD;  Location: [facility] ENDOSCOPY;  Service: Endoscopy;  Laterality:  N/A;   ESOPHAGOGASTRODUODENOSCOPY (EGD) WITH PROPOFOL N/A 12/19/2018   Procedure: ESOPHAGOGASTRODUODENOSCOPY (EGD) WITH PROPOFOL;  Surgeon: Lollie Sails, MD;  Location: [facility] ENDOSCOPY;  Service: Endoscopy;  Laterality: N/A;   gastritis     h.pylori     INTRAVASCULAR ULTRASOUND/IVUS N/A 02/03/2019   Procedure: Intravascular Ultrasound/IVUS;  Surgeon: Nelva Bush, MD;  Location: [place] CV LAB;  Service: Cardiovascular;  Laterality: N/A;   left elbow repair Left    LEFT HEART CATH AND CORONARY ANGIOGRAPHY Left 01/21/2019   Procedure: LEFT HEART CATH AND CORONARY ANGIOGRAPHY;  Surgeon: Nelva Bush, MD;  Location: [place] [facility] LAB;  Service: Cardiovascular;  Laterality: Left;   SPLENECTOMY  1957   TONSILLECTOMY      Medications:  Current Outpatient Medications on File Prior to Visit  Medication Sig   allopurinol (ZYLOPRIM) 300 MG tablet Take 1 tablet (300 mg total) by mouth daily.   amLODipine (NORVASC) 10 MG tablet Take 1 tablet (10 mg total) by mouth daily.   aspirin EC 81 MG tablet Take 81 mg by mouth daily.   atorvastatin (LIPITOR) 40 MG tablet Take 1 tablet (40 mg total) by mouth daily.   clopidogrel (PLAVIX) 75 MG tablet Take 1 tablet (75 mg total) by mouth daily with breakfast.   ELDERBERRY PO Take 1 tablet by mouth 3 (three) times a week.   LORazepam (ATIVAN) 1 MG tablet Take 0.5-1 tablets (0.5-1 mg total) by mouth daily as needed for anxiety.   losartan (COZAAR) 100 MG tablet Take 1 tablet (100 mg total) by mouth daily.   metoprolol tartrate (LOPRESSOR) 25 MG tablet Take 1 tablet (25 mg total) by mouth 2 (two) times daily.   Multiple Vitamin (MULTIVITAMIN) tablet Take 1 tablet by mouth daily.   Multiple Vitamins-Minerals (EMERGEN-C IMMUNE PO) Take 1 tablet by mouth every other day.    nitroGLYCERIN (NITROSTAT) 0.4 MG SL tablet Place 1 tablet (0.4 mg total) under the tongue every 5 (five) minutes as needed for chest pain. Maximum of 3 doses.    pantoprazole (PROTONIX) 20 MG tablet Take 1 tablet (20 mg total) by mouth 2 (two) times daily.   sertraline (ZOLOFT) 50 MG tablet Take 1 tablet (50 mg total) by mouth daily.   furosemide (LASIX) 20 MG tablet Take 1 tablet (20 mg total) by mouth daily.   No  current facility-administered medications on file prior to visit.    Allergies:  No Known Allergies  Social History:  Social History   Socioeconomic History   Marital status: Married    Spouse name: Not on file   Number of children: Not on file   Years of education: Not on file   Highest education level: Master's degree (e.g., MA, MS, MEng, MEd, MSW, MBA)  Occupational History   Occupation: retired   Tobacco Use   Smoking status: Never Smoker   Smokeless tobacco: Never Used  Scientific laboratory technician Use: Never used  Substance and Sexual Activity   Alcohol use: Yes    Alcohol/week: 7.0 standard drinks    Types: 7 Standard drinks or equivalent per week    Comment: moderate   Drug use: No   Sexual activity: Yes  Other Topics Concern   Not on file  Social History Narrative   Rides bicycle with friends       No smoking.  Occasional alcohol.  Lives at home.  Retired Customer service manager.   Social Determinants of Health   Financial Resource Strain: Low Risk    Difficulty of Paying Living Expenses: Not hard at all  Food Insecurity: No Food Insecurity   Worried About Charity fundraiser in the Last Year: Never true   [place] in the Last Year: Never true  Transportation Needs: No Transportation Needs   Lack of Transportation (Medical): No   Lack of Transportation (Non-Medical): No  Physical Activity: Sufficiently Active   Days of Exercise per Week: 5 days   Minutes of Exercise per Session: 60 min  Stress: No Stress Concern Present   Feeling of Stress : Not at all  Social Connections:    Frequency of Communication with Friends and Family: Not on file   Frequency of Social Gatherings with Friends and  Family: Not on file   Attends Religious Services: Not on Electrical engineer or Organizations: Not on file   Attends Archivist Meetings: Not on file   Marital Status: Not on file  Intimate Partner Violence:    Fear of Current or Ex-Partner: Not on file   Emotionally Abused: Not on file   Physically Abused: Not on file   Sexually Abused: Not on file   Social History   Tobacco Use  Smoking Status Never Smoker  Smokeless Tobacco Never Used   Social History   Substance and Sexual Activity  Alcohol Use Yes   Alcohol/week: 7.0 standard drinks   Types: 7 Standard drinks or equivalent per week   Comment: moderate    Family History:  Family History  Adopted: Yes  Problem Relation Age of Onset   Heart attack Mother 2   Pancreatic cancer Father        died in WWII - pt adopted   Hypertension Sister    Hyperlipidemia Sister    Hypertension Brother    Hyperlipidemia Brother    Prostate cancer Neg Hx    Kidney cancer Neg Hx    Bladder Cancer Neg Hx     Past medical history, surgical history, medications, allergies, family history and social history reviewed with patient today and changes made to appropriate areas of the chart.   Review of Systems - negative All other ROS negative except what is listed above and in the HPI.      Objective:    BP 130/78    Pulse 80    Temp (!)  97.5 F (36.4 C) (Oral)    Ht 5\' 11"  (1.803 m)    Wt 263 lb 6.4 oz (119.5 kg)    SpO2 96%    BMI 36.74 kg/m   Wt Readings from Last 3 Encounters:  09/14/20 263 lb 6.4 oz (119.5 kg)  08/26/20 260 lb (117.9 kg)  08/25/20 262 lb (118.8 kg)    Physical Exam Vitals and nursing note reviewed.  Constitutional:      General: He is awake. He is not in acute distress.    Appearance: He is well-developed and well-groomed. He is obese. He is not ill-appearing.  HENT:     Head: Normocephalic and atraumatic.     Right Ear: Hearing, tympanic membrane, ear canal and  external ear normal. No drainage.     Left Ear: Hearing, tympanic membrane, ear canal and external ear normal. No drainage.     Nose: Nose normal.     Mouth/Throat:     Pharynx: Uvula midline.  Eyes:     General: Lids are normal.        Right eye: No discharge.        Left eye: No discharge.     Extraocular Movements: Extraocular movements intact.     Conjunctiva/sclera: Conjunctivae normal.     Pupils: Pupils are equal, round, and reactive to light.     Visual Fields: Right eye visual fields normal and left eye visual fields normal.  Neck:     Thyroid: No thyromegaly.     Vascular: No carotid bruit or JVD.     Trachea: Trachea normal.  Cardiovascular:     Rate and Rhythm: Normal rate and regular rhythm.     Heart sounds: Normal heart sounds, S1 normal and S2 normal. No murmur heard.  No gallop.   Pulmonary:     Effort: Pulmonary effort is normal. No accessory muscle usage or respiratory distress.     Breath sounds: Normal breath sounds.  Abdominal:     General: Bowel sounds are normal.     Palpations: Abdomen is soft. There is no hepatomegaly or splenomegaly.     Tenderness: There is no abdominal tenderness.  Musculoskeletal:        General: Normal range of motion.     Cervical back: Normal range of motion and neck supple.     Right lower leg: No edema.     Left lower leg: No edema.  Lymphadenopathy:     Head:     Right side of head: No submental, submandibular, tonsillar, preauricular or posterior auricular adenopathy.     Left side of head: No submental, submandibular, tonsillar, preauricular or posterior auricular adenopathy.     Cervical: No cervical adenopathy.  Skin:    General: Skin is warm and dry.     Capillary Refill: Capillary refill takes less than 2 seconds.     Findings: No rash.     Comments: Scattered small pale purple bruises noted BUE, no skin breakdown.  Neurological:     Mental Status: He is alert and oriented to person, place, and time.     Cranial  Nerves: Cranial nerves are intact.     Gait: Gait is intact.     Deep Tendon Reflexes: Reflexes are normal and symmetric.     Reflex Scores:      Brachioradialis reflexes are 2+ on the right side and 2+ on the left side.      Patellar reflexes are 2+ on the right side and 2+ on  the left side. Psychiatric:        Attention and Perception: Attention normal.        Mood and Affect: Mood normal.        Speech: Speech normal.        Behavior: Behavior normal. Behavior is cooperative.        Thought Content: Thought content normal.        Cognition and Memory: Cognition normal.        Judgment: Judgment normal.    Results for orders placed or performed in visit on 08/26/20  Bladder Scan (Post Void Residual) in office  Result Value Ref Range   Scan Result 65mL       Assessment & Plan:   Problem List Items Addressed This Visit      Cardiovascular and Mediastinum   Essential hypertension    Chronic, stable with BP at goal on home readings and on repeat in office today.  Continue current medication regimen and adjust as needed, sees cardiology will continue this collaboration.  BMP and TSH today.  He declines OSA testing.  Recommend he continue to monitor BP at home and document + focus on DASH diet and regular exercise.  Return in 6 months.      Relevant Orders   CBC with Differential/Platelet   Comprehensive metabolic panel   TSH   Coronary artery disease involving native coronary artery of native heart without angina pectoris    Chronic, stable.  No recent NTG use.  Continue current medication regimen and collaboration with cardiology.  To alert provider immediately if CP presents or go immediately to ER.      Purpura senilis ([place])    Recommend gentle skin care at home and monitor for abrasions or skin breakdown, alert provider if present.      Chronic heart failure with preserved ejection fraction (HFpEF) (HCC) - Primary    Chronic, stable with no recent exacerbations.  Continue  current medication regimen and collaboration with cardiology.  Recommend: - Reminded to call for an overnight weight gain of >2 pounds or a weekly weight weight of >5 pounds - not adding salt to his food and has been reading food labels. Reviewed the importance of keeping daily sodium intake to 2000mg  daily       Relevant Orders   CBC with Differential/Platelet     Digestive   Barrett's esophagus    Chronic, stable.  Continue current Protonix dose and check Mag level today.      Relevant Orders   Magnesium     Genitourinary   BPH (benign prostatic hyperplasia)    Chronic, ongoing,  Well-controlled without medication at this time.  Continue to monitor, check PSA today.      Relevant Orders   PSA     Other   Acute anxiety    Chronic, ongoing.  At length discussion on risks of long term benzo use, he wishes to continue treatment and reports using minimally.  Refills sent #30 pills with 1 refill.  This often lasts 6 months.  Return in 6 months and will need UDS and contract at that time.      Gout    Chronic, stable.  Controlled with Allopurinol and renal dose if needed.  Continue current medication regimen.  Labs today.      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  Continue collaboration with oncology, recent notes reviewed.      Depression, recurrent (HCC)    Chronic, stable.  Denies SI/HI.  Continue current medication regimen and adjust as needed, benefit obtained from change to Zoloft.  Recommend relaxation and meditation techniques at home.  Return in 6 months.      Hyperlipidemia LDL goal <70    Chronic, ongoing.  Continue current medication and adjust as needed.  Lipid panel today.      Relevant Orders   Lipid Panel w/o Chol/HDL Ratio   Morbid obesity (HCC)    BMI 36.74 with underlying cardiac issues.  Recommended eating  smaller high protein, low fat meals more frequently and exercising 30 mins a day 5 times a week with a goal of 10-15lb weight loss in the next 3 months. Patient voiced their understanding and motivation to adhere to these recommendations.        Other Visit Diagnoses    Need for influenza vaccination       Flu shot today   Relevant Orders   Flu Vaccine QUAD High Dose(Fluad) (Completed)      Discussed aspirin prophylaxis for myocardial infarction prevention and decision was made to continue ASA  LABORATORY TESTING:  Health maintenance labs ordered today as discussed above.   The natural history of prostate cancer and ongoing controversy regarding screening and potential treatment outcomes of prostate cancer has been discussed with the patient. The meaning of a false positive PSA and a false negative PSA has been discussed. He indicates understanding of the limitations of this screening test and wishes to proceed with screening PSA testing.   IMMUNIZATIONS:   - Tdap: Tetanus vaccination status reviewed: last tetanus booster within 10 years. - Influenza: Administered today - Pneumovax: Up to date - Prevnar: Up to date - Zostavax vaccine: Up to date  SCREENING: - Colonoscopy: Up to date  Discussed with patient purpose of the colonoscopy is to detect colon cancer at curable precancerous or early stages   - AAA Screening: Not applicable  -Hearing Test: Not applicable  -Spirometry: Not applicable   PATIENT COUNSELING:    Sexuality: Discussed sexually transmitted diseases, partner selection, use of condoms, avoidance of unintended pregnancy  and contraceptive alternatives.   Advised to avoid cigarette smoking.  I discussed with the patient that most people either abstain from alcohol or drink within safe limits (<=14/week and <=4 drinks/occasion for males, <=7/weeks and <= 3 drinks/occasion for females) and that the risk for alcohol disorders and other health effects rises  proportionally with the number of drinks per week and how often a drinker exceeds daily limits.  Discussed cessation/primary prevention of drug use and availability of treatment for abuse.   Diet: Encouraged to adjust caloric intake to maintain  or achieve ideal body weight, to reduce intake of dietary saturated fat and total fat, to limit sodium intake by avoiding high sodium foods and not adding table salt, and to maintain adequate dietary potassium and calcium preferably from fresh fruits, vegetables, and low-fat dairy products.    Stressed the importance of regular exercise  Injury prevention: Discussed safety belts, safety helmets, smoke detector, smoking near bedding or upholstery.   Dental health: Discussed importance of regular tooth brushing, flossing, and dental visits.   Follow up plan: NEXT PREVENTATIVE PHYSICAL DUE IN 1 YEAR. Return in about 6 months (around 03/15/2021) for MOOD, HTN/HLD, GERD -- needs UDS and control subs contract.

## 2020-09-14 NOTE — Assessment & Plan Note (Signed)
Chronic, stable.  Controlled with Allopurinol and renal dose if needed.  Continue current medication regimen.  Labs today.

## 2020-09-14 NOTE — Assessment & Plan Note (Signed)
Chronic, stable.  Continue current Protonix dose and check Mag level today.

## 2020-09-14 NOTE — Assessment & Plan Note (Signed)
Chronic, stable.  No recent NTG use.  Continue current medication regimen and collaboration with cardiology.  To alert provider immediately if CP presents or go immediately to ER. 

## 2020-09-14 NOTE — Assessment & Plan Note (Addendum)
Chronic, stable with BP at goal on home readings and on repeat in office today.  Continue current medication regimen and adjust as needed, sees cardiology will continue this collaboration.  BMP and TSH today.  He declines OSA testing.  Recommend he continue to monitor BP at home and document + focus on DASH diet and regular exercise.  Return in 6 months.

## 2020-09-14 NOTE — Assessment & Plan Note (Signed)
Chronic, stable with no recent exacerbations.  Continue current medication regimen and collaboration with cardiology.  Recommend: - Reminded to call for an overnight weight gain of >2 pounds or a weekly weight weight of >5 pounds - not adding salt to his food and has been reading food labels. Reviewed the importance of keeping daily sodium intake to <2000mg daily  

## 2020-09-15 ENCOUNTER — Other Ambulatory Visit: Payer: Self-pay | Admitting: Nurse Practitioner

## 2020-09-15 DIAGNOSIS — N400 Enlarged prostate without lower urinary tract symptoms: Secondary | ICD-10-CM

## 2020-09-15 LAB — COMPREHENSIVE METABOLIC PANEL
ALT: 28 IU/L (ref 0–44)
AST: 29 IU/L (ref 0–40)
Albumin/Globulin Ratio: 1.8 (ref 1.2–2.2)
Albumin: 4.7 g/dL (ref 3.7–4.7)
Alkaline Phosphatase: 85 IU/L (ref 44–121)
BUN/Creatinine Ratio: 17 (ref 10–24)
BUN: 15 mg/dL (ref 8–27)
Bilirubin Total: 0.8 mg/dL (ref 0.0–1.2)
CO2: 26 mmol/L (ref 20–29)
Calcium: 9.8 mg/dL (ref 8.6–10.2)
Chloride: 97 mmol/L (ref 96–106)
Creatinine, Ser: 0.9 mg/dL (ref 0.76–1.27)
GFR calc Af Amer: 96 mL/min/{1.73_m2} (ref >59)
GFR calc non Af Amer: 83 mL/min/{1.73_m2} (ref >59)
Globulin, Total: 2.6 g/dL (ref 1.5–4.5)
Glucose: 107 mg/dL — ABNORMAL HIGH (ref 65–99)
Potassium: 4.1 mmol/L (ref 3.5–5.2)
Sodium: 139 mmol/L (ref 134–144)
Total Protein: 7.3 g/dL (ref 6.0–8.5)

## 2020-09-15 LAB — CBC WITH DIFFERENTIAL/PLATELET
Basophils Absolute: 0.1 10*3/uL (ref 0.0–0.2)
Basos: 2 %
EOS (ABSOLUTE): 0.1 10*3/uL (ref 0.0–0.4)
Eos: 1 %
Hematocrit: 40.4 % (ref 37.5–51.0)
Hemoglobin: 14.2 g/dL (ref 13.0–17.7)
Immature Grans (Abs): 0 10*3/uL (ref 0.0–0.1)
Immature Granulocytes: 0 %
Lymphocytes Absolute: 2.1 10*3/uL (ref 0.7–3.1)
Lymphs: 28 %
MCH: 35.4 pg — ABNORMAL HIGH (ref 26.6–33.0)
MCHC: 35.1 g/dL (ref 31.5–35.7)
MCV: 101 fL — ABNORMAL HIGH (ref 79–97)
Monocytes Absolute: 0.8 10*3/uL (ref 0.1–0.9)
Monocytes: 11 %
Neutrophils Absolute: 4.3 10*3/uL (ref 1.4–7.0)
Neutrophils: 58 %
Platelets: 302 10*3/uL (ref 150–450)
RBC: 4.01 x10E6/uL — ABNORMAL LOW (ref 4.14–5.80)
RDW: 12.4 % (ref 11.6–15.4)
WBC: 7.4 10*3/uL (ref 3.4–10.8)

## 2020-09-15 LAB — TSH: TSH: 1.39 u[IU]/mL (ref 0.450–4.500)

## 2020-09-15 LAB — MAGNESIUM: Magnesium: 1.5 mg/dL — ABNORMAL LOW (ref 1.6–2.3)

## 2020-09-15 LAB — PSA: Prostate Specific Ag, Serum: 5.8 ng/mL — ABNORMAL HIGH (ref 0.0–4.0)

## 2020-09-15 LAB — LIPID PANEL W/O CHOL/HDL RATIO
Cholesterol, Total: 152 mg/dL (ref 100–199)
HDL: 83 mg/dL (ref >39)
LDL Chol Calc (NIH): 57 mg/dL (ref 0–99)
Triglycerides: 55 mg/dL (ref 0–149)
VLDL Cholesterol Cal: 12 mg/dL (ref 5–40)

## 2020-09-15 LAB — URIC ACID: Uric Acid: 4.3 mg/dL (ref 3.8–8.4)

## 2020-09-15 NOTE — Progress Notes (Signed)
Contacted via MyChart

## 2020-09-27 ENCOUNTER — Other Ambulatory Visit: Payer: Self-pay | Admitting: Nurse Practitioner

## 2020-09-27 MED ORDER — CLOPIDOGREL BISULFATE 75 MG PO TABS
75.0000 mg | ORAL_TABLET | Freq: Every day | ORAL | 4 refills | Status: DC
Start: 2020-09-27 — End: 2021-09-15

## 2020-09-27 NOTE — Telephone Encounter (Signed)
Requested Prescriptions  Pending Prescriptions Disp Refills   clopidogrel (PLAVIX) 75 MG tablet 90 tablet 4    Sig: Take 1 tablet (75 mg total) by mouth daily with breakfast.     Hematology: Antiplatelets - clopidogrel Failed - 09/27/2020  3:42 PM      Failed - Evaluate AST, ALT within 2 months of therapy initiation.      Passed - ALT in normal range and within 360 days    ALT  Date Value Ref Range Status  09/14/2020 28 0 - 44 IU/L Final   ALT (SGPT) Piccolo, Waived  Date Value Ref Range Status  02/12/2019 30 10 - 47 U/L Final         Passed - AST in normal range and within 360 days    AST  Date Value Ref Range Status  09/14/2020 29 0 - 40 IU/L Final   AST (SGOT) Piccolo, Waived  Date Value Ref Range Status  02/12/2019 38 11 - 38 U/L Final         Passed - HCT in normal range and within 180 days    Hematocrit  Date Value Ref Range Status  09/14/2020 40.4 37.5 - 51.0 % Final         Passed - HGB in normal range and within 180 days    Hemoglobin  Date Value Ref Range Status  09/14/2020 14.2 13.0 - 17.7 g/dL Final         Passed - PLT in normal range and within 180 days    Platelets  Date Value Ref Range Status  09/14/2020 302 150 - 450 x10E3/uL Final         Passed - Valid encounter within last 6 months    Recent Outpatient Visits          1 week ago Chronic heart failure with preserved ejection fraction (HFpEF) ([place])   [place], Jolene T, NP   3 months ago Depression, recurrent ([place])   [place], Jolene T, NP   7 months ago Depression, recurrent ([place])   [place], Barbaraann Faster, NP   1 year ago Encounter for annual physical exam   [place], Barbaraann Faster, NP   1 year ago Essential hypertension   [place], MD      Future Appointments            In 4 months End, Harrell Gave, MD [facility]   In 5 months [place],  Barbaraann Faster, NP [facility] [facility], [facility]   In 8 months  [employer], [place]            Pt. Requested to send refills into [facility] in [place].  Was incorrectly sent to [facility] on 09/14/20.

## 2020-09-27 NOTE — Telephone Encounter (Signed)
clopidogrel (PLAVIX) 75 MG tablet 90 tablet 4 09/14/2020    Sig - Route: Take 1 tablet (75 mg total) by mouth daily with breakfast. - Oral   Sent to pharmacy as: clopidogrel (PLAVIX) 75 MG tablet   E-Prescribing Status: Receipt confirmed by pharmacy (09/14/2020 1:53 PM EDT)    [id number] [address], [place] [address] [place] Phone:  [phone]  Fax:  [phone]     Please resen , reroute this med to [place] above, it was just sent out to [facility] in error as not in PT list has not used them he states in over 10 years. Resend to [employer]

## 2020-10-18 DIAGNOSIS — Z23 Encounter for immunization: Secondary | ICD-10-CM | POA: Diagnosis not present

## 2020-11-07 ENCOUNTER — Telehealth: Payer: Self-pay | Admitting: Nurse Practitioner

## 2020-11-08 ENCOUNTER — Other Ambulatory Visit: Payer: Self-pay | Admitting: Nurse Practitioner

## 2020-11-08 MED ORDER — SERTRALINE HCL 50 MG PO TABS
50.0000 mg | ORAL_TABLET | Freq: Every day | ORAL | 4 refills | Status: DC
Start: 2020-11-08 — End: 2021-09-15

## 2020-11-08 NOTE — Telephone Encounter (Signed)
Spoke with pt to get name of medication he is almost out of Pt stated it was his sertraline (ZOLOFT) 50 MG tablet.

## 2020-11-08 NOTE — Telephone Encounter (Signed)
Pt calling stating that he only has 3 days left of this medication. Please advise.

## 2020-11-08 NOTE — Telephone Encounter (Signed)
Sent this in

## 2020-11-08 NOTE — Telephone Encounter (Signed)
Routing to provider  

## 2020-11-29 DIAGNOSIS — H2513 Age-related nuclear cataract, bilateral: Secondary | ICD-10-CM | POA: Diagnosis not present

## 2020-12-16 ENCOUNTER — Ambulatory Visit: Payer: Medicare Other | Admitting: Internal Medicine

## 2020-12-16 ENCOUNTER — Other Ambulatory Visit: Payer: Medicare Other

## 2020-12-23 ENCOUNTER — Telehealth: Payer: Self-pay | Admitting: Internal Medicine

## 2020-12-23 NOTE — Telephone Encounter (Addendum)
12/23/2020 Spoke w/ pt regarding moving appt out due to rise in COVID cases. New appt made for 01/21/21, pt agreeable to change SRW

## 2020-12-28 ENCOUNTER — Other Ambulatory Visit: Payer: Medicare Other

## 2020-12-28 ENCOUNTER — Ambulatory Visit: Payer: Medicare Other | Admitting: Internal Medicine

## 2021-01-21 ENCOUNTER — Inpatient Hospital Stay: Payer: Medicare Other | Attending: Internal Medicine

## 2021-01-21 ENCOUNTER — Inpatient Hospital Stay (HOSPITAL_BASED_OUTPATIENT_CLINIC_OR_DEPARTMENT_OTHER): Payer: Medicare Other | Admitting: Internal Medicine

## 2021-01-21 DIAGNOSIS — Z9081 Acquired absence of spleen: Secondary | ICD-10-CM | POA: Diagnosis not present

## 2021-01-21 DIAGNOSIS — Z86010 Personal history of colonic polyps: Secondary | ICD-10-CM | POA: Diagnosis not present

## 2021-01-21 DIAGNOSIS — Z8719 Personal history of other diseases of the digestive system: Secondary | ICD-10-CM | POA: Diagnosis not present

## 2021-01-21 DIAGNOSIS — R635 Abnormal weight gain: Secondary | ICD-10-CM | POA: Diagnosis not present

## 2021-01-21 DIAGNOSIS — C884 Extranodal marginal zone B-cell lymphoma of mucosa-associated lymphoid tissue [MALT-lymphoma]: Secondary | ICD-10-CM

## 2021-01-21 DIAGNOSIS — I2510 Atherosclerotic heart disease of native coronary artery without angina pectoris: Secondary | ICD-10-CM | POA: Insufficient documentation

## 2021-01-21 DIAGNOSIS — Z800 Family history of malignant neoplasm of digestive organs: Secondary | ICD-10-CM | POA: Insufficient documentation

## 2021-01-21 DIAGNOSIS — I10 Essential (primary) hypertension: Secondary | ICD-10-CM | POA: Diagnosis not present

## 2021-01-21 DIAGNOSIS — Z7289 Other problems related to lifestyle: Secondary | ICD-10-CM | POA: Diagnosis not present

## 2021-01-21 LAB — CBC WITH DIFFERENTIAL/PLATELET
Abs Immature Granulocytes: 0.07 10*3/uL (ref 0.00–0.07)
Basophils Absolute: 0.1 10*3/uL (ref 0.0–0.1)
Basophils Relative: 2 %
Eosinophils Absolute: 0.2 10*3/uL (ref 0.0–0.5)
Eosinophils Relative: 3 %
HCT: 41.9 % (ref 39.0–52.0)
Hemoglobin: 14.4 g/dL (ref 13.0–17.0)
Immature Granulocytes: 1 %
Lymphocytes Relative: 39 %
Lymphs Abs: 2.6 10*3/uL (ref 0.7–4.0)
MCH: 34.1 pg — ABNORMAL HIGH (ref 26.0–34.0)
MCHC: 34.4 g/dL (ref 30.0–36.0)
MCV: 99.3 fL (ref 80.0–100.0)
Monocytes Absolute: 0.8 10*3/uL (ref 0.1–1.0)
Monocytes Relative: 12 %
Neutro Abs: 2.9 10*3/uL (ref 1.7–7.7)
Neutrophils Relative %: 43 %
Platelets: 289 10*3/uL (ref 150–400)
RBC: 4.22 MIL/uL (ref 4.22–5.81)
RDW: 13 % (ref 11.5–15.5)
WBC: 6.8 10*3/uL (ref 4.0–10.5)
nRBC: 0.6 % — ABNORMAL HIGH (ref 0.0–0.2)

## 2021-01-21 LAB — COMPREHENSIVE METABOLIC PANEL
ALT: 35 U/L (ref 0–44)
AST: 43 U/L — ABNORMAL HIGH (ref 15–41)
Albumin: 4.4 g/dL (ref 3.5–5.0)
Alkaline Phosphatase: 64 U/L (ref 38–126)
Anion gap: 11 (ref 5–15)
BUN: 20 mg/dL (ref 8–23)
CO2: 25 mmol/L (ref 22–32)
Calcium: 9.5 mg/dL (ref 8.9–10.3)
Chloride: 103 mmol/L (ref 98–111)
Creatinine, Ser: 0.85 mg/dL (ref 0.61–1.24)
GFR, Estimated: >60 mL/min (ref >60)
Glucose, Bld: 121 mg/dL — ABNORMAL HIGH (ref 70–99)
Potassium: 4.5 mmol/L (ref 3.5–5.1)
Sodium: 139 mmol/L (ref 135–145)
Total Bilirubin: 0.9 mg/dL (ref 0.3–1.2)
Total Protein: 7.6 g/dL (ref 6.5–8.1)

## 2021-01-21 LAB — LACTATE DEHYDROGENASE: LDH: 176 U/L (ref 98–192)

## 2021-01-21 NOTE — Progress Notes (Signed)
[place] NOTE  Patient Care Team: Venita Lick, NP as PCP - General (Nurse Practitioner) End, Harrell Gave, MD as PCP - Cardiology (Cardiology) Lollie Sails, MD (Inactive) as Consulting Physician (Gastroenterology) Minor, Dalbert Garnet, RN (Inactive) as [place] Management  CHIEF COMPLAINTS/PURPOSE OF CONSULTATION:  Lymphoma  #  Oncology History Overview Note  # NOV 2019- MALT [II opinion at [facility]]; s/p sigmoid colon polyp resection [Dr.Skulskie; incidental]; PET scan -NED  # Barrett's esophagus- 2018;#Colonic polyps surveillance/Dr. 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  male is here for follow-up of his MALT lymphoma of the colon/polyp status post resection.  Patient denies any new lumps or bumps.  Appetite is good.  No weight loss but no night sweats.   Patient states that she has recently switched to vegan diet.  Also trying to exercise.   Review of Systems  Constitutional: Positive for malaise/fatigue. Negative for chills, diaphoresis, fever and weight loss.  HENT: Negative for nosebleeds and sore throat.   Eyes: Negative for double vision.  Respiratory: Negative for cough, hemoptysis, sputum production, shortness of breath and wheezing.   Cardiovascular: Negative for chest pain, palpitations, orthopnea and leg swelling.  Gastrointestinal: Negative for abdominal pain, blood in stool, constipation, diarrhea, heartburn, melena, nausea and vomiting.   Genitourinary: Negative for dysuria, frequency and urgency.  Musculoskeletal: Negative for back pain and joint pain.  Skin: Negative.  Negative for itching and rash.  Neurological: Negative for dizziness, tingling, focal weakness, weakness and headaches.  Endo/Heme/Allergies: Does not bruise/bleed easily.  Psychiatric/Behavioral: Negative for depression. The patient is not nervous/anxious and does not have insomnia.      MEDICAL HISTORY:  Past Medical History:  Diagnosis Date  . (HFpEF) heart failure with preserved ejection fraction ([place])    a. 01/2019 Cath: EF 55-60% w/ elevated LVEDP.  Marland Kitchen Anxiety   . Barrett's esophagus   . CAD (coronary artery disease)    a. 10/2018 ETT: 31mm horizontal inflat ST depression, freq PVC's->Intermediate; b. 12/2018 Cardiac CTA: LM nl, LAD FFRct 0.6 in mLAD, LCX FFRct 0.99p, 0.31m, 0.77d, RCA no signif dzs, FFRct 0.89d->Rec cath; c. 01/2019 PCI: LM nl, LAD 40p, 67m, D2 70, LCX 70p (2.75x15 Resolute Onyx DES), RCA min irregs. EF 55-65%. EDP 25-80mmHg; d. 01/2019 PCI: LAD 80-90 (atherectomy & 3x15 Resolute Onyx DES).  . Colon polyps   . Duodenal adenoma   . Gastritis   . GERD (gastroesophageal reflux disease)   . Gout   . H. pylori infection   . H/O urethral stricture   . Hemorrhoids   . Hiatal hernia   . Hypertension   . Stomach ulcer     SURGICAL HISTORY: Past Surgical History:  Procedure Laterality Date  . COLONOSCOPY    . COLONOSCOPY WITH PROPOFOL N/A 08/20/2018   Procedure: COLONOSCOPY WITH PROPOFOL;  Surgeon: Lollie Sails, MD;  Location: [facility] ENDOSCOPY;  Service: Endoscopy;  Laterality: N/A;  . COLONOSCOPY WITH PROPOFOL N/A 12/19/2018   Procedure:  COLONOSCOPY WITH PROPOFOL;  Surgeon: Lollie Sails, MD;  Location: [facility] ENDOSCOPY;  Service: Endoscopy;  Laterality: N/A;  . CORONARY ATHERECTOMY N/A 02/03/2019   Procedure: CORONARY ATHERECTOMY;  Surgeon: Nelva Bush, MD;  Location: [place] CV LAB;  Service: Cardiovascular;  Laterality:  N/A;  . CORONARY STENT INTERVENTION N/A 01/21/2019   Procedure: CORONARY STENT INTERVENTION;  Surgeon: Nelva Bush, MD;  Location: [place] [facility] CV LAB;  Service: Cardiovascular;  Laterality: N/A;  . CORONARY STENT INTERVENTION N/A 02/03/2019   Procedure: CORONARY STENT INTERVENTION;  Surgeon: Nelva Bush, MD;  Location: [place] CV LAB;  Service: Cardiovascular;  Laterality: N/A;  . CYSTOSCOPY WITH URETHRAL DILATATION N/A 08/16/2018   Procedure: CYSTOSCOPY WITH URETHRAL DILATATION;  Surgeon: [name], MD;  Location: [facility] ORS;  Service: Urology;  Laterality: N/A;  . ESOPHAGOGASTRODUODENOSCOPY N/A 01/29/2017   Procedure: ESOPHAGOGASTRODUODENOSCOPY (EGD);  Surgeon: Lollie Sails, MD;  [facility] END[facility];  Service: Endoscopy;  Laterality: N/A;  . ESOPHAGOGASTRODUODENOSCOPY (EGD) WITH PROPOFOL N/A 07/10/2016   Procedure: ESOPHAGOGASTRODUODENOSCOPY (EGD) WITH PROPOFOL;  Surgeon: Lollie Sails, MD;  Location: [facility] ENDOSCOPY;  Service: Endoscopy;  Laterality: N/A;  . ESOPHAGOGASTRODUODENOSCOPY (EGD) WITH PROPOFOL N/A 12/19/2018   Procedure: ESOPHAGOGASTRODUODENOSCOPY (EGD) WITH PROPOFOL;  Surgeon: Lollie Sails, MD;  Location: [facility] ENDOSCOPY;  Service: Endoscopy;  Laterality: N/A;  . gastritis    . h.pylori    . INTRAVASCULAR ULTRASOUND/IVUS N/A 02/03/2019   Procedure: Intravascular Ultrasound/IVUS;  Surgeon: Nelva Bush, MD;  Location: [place] CV LAB;  Service: Cardiovascular;  Laterality: N/A;  . left elbow repair Left   . LEFT HEART CATH AND CORONARY ANGIOGRAPHY Left 01/21/2019   Procedure: LEFT HEART CATH AND CORONARY ANGIOGRAPHY;  Surgeon: Nelva Bush, MD;  Location: [place] CV LAB;  Service: Cardiovascular;  Laterality: Left;  . SPLENECTOMY  1957  . TONSILLECTOMY      SOCIAL HISTORY: Social History   Socioeconomic History  . Marital status: Married    Spouse name: Not on file  . Number of children: Not on file  . Years of education: Not  on file  . Highest education level: Master's degree (e.g., MA, MS, MEng, MEd, MSW, MBA)  Occupational History  . Occupation: retired   Tobacco Use  . Smoking status: Never Smoker  . Smokeless tobacco: Never Used  Vaping Use  . Vaping Use: Never used  Substance and Sexual Activity  . Alcohol use: Yes    Alcohol/week: 7.0 standard drinks    Types: 7 Standard drinks or equivalent per week    Comment: moderate  . Drug use: No  . Sexual activity: Yes  Other Topics Concern  . Not on file  Social History Narrative   Rides bicycle with friends       No smoking.  Occasional alcohol.  Lives at home.  Retired Customer service manager.   Social Determinants of Health   Financial Resource Strain: Low Risk   . Difficulty of Paying Living Expenses: Not hard at all  Food Insecurity: No Food Insecurity  . Worried About Charity fundraiser in the Last Year: Never true  . Ran Out of Food in the Last Year: Never true  Transportation Needs: No Transportation Needs  . Lack of Transportation (Medical): No  . Lack of Transportation (Non-Medical): No  Physical Activity: Sufficiently Active  . Days of Exercise per Week: 5 days  . Minutes of Exercise per Session: 60 min  Stress: No Stress Concern Present  . Feeling of Stress : Not at all  Social Connections: Not on file  Intimate Partner Violence: Not on file    FAMILY HISTORY: Family History  Adopted: Yes  Problem Relation Age of Onset  . Heart attack Mother 13  . Pancreatic cancer Father        died in WWII - pt adopted  . Hypertension Sister   . Hyperlipidemia Sister   . Hypertension Brother   . Hyperlipidemia Brother   . Prostate cancer Neg Hx   . Kidney cancer Neg Hx   . Bladder Cancer Neg Hx     ALLERGIES:  has No Known Allergies.  MEDICATIONS:  Current Outpatient Medications  Medication Sig Dispense Refill  . allopurinol (ZYLOPRIM) 300 MG tablet Take 1 tablet (300 mg total) by mouth daily. 90 tablet 0  . amLODipine (NORVASC) 10 MG tablet  Take 1 tablet (10 mg total) by mouth daily. 90 tablet 4  . aspirin EC 81 MG tablet Take 81 mg by mouth daily.    Marland Kitchen atorvastatin (LIPITOR) 40 MG tablet Take 1 tablet (40 mg total) by mouth daily. 90 tablet 4  . clopidogrel (PLAVIX) 75 MG tablet Take 1 tablet (75 mg total) by mouth daily with breakfast. 90 tablet 4  . ELDERBERRY PO Take 1 tablet by mouth 3 (three) times a week.    . furosemide (LASIX) 20 MG tablet Take 1 tablet (20 mg total) by mouth daily. 90 tablet 2  . LORazepam (ATIVAN) 1 MG tablet Take 0.5-1 tablets (0.5-1 mg total) by mouth daily as needed for anxiety. 30 tablet 1  . losartan (COZAAR) 100 MG tablet Take 1 tablet (100 mg total) by mouth daily. 90 tablet 4  . metoprolol tartrate (LOPRESSOR) 25 MG tablet Take 1 tablet (25 mg total) by mouth 2 (two) times daily. 180 tablet 3  . Multiple Vitamin (MULTIVITAMIN) tablet Take 1 tablet by mouth daily.    . Multiple Vitamins-Minerals (EMERGEN-C IMMUNE PO) Take 1 tablet by mouth every other day.     . nitroGLYCERIN (NITROSTAT) 0.4 MG SL tablet Place 1 tablet (0.4 mg total) under the tongue every 5 (five) minutes as needed for chest pain. Maximum of 3 doses. 25 tablet 3  . Omega 3 1200 MG CAPS Take by mouth.    . pantoprazole (PROTONIX) 20 MG tablet Take 1 tablet (20 mg total) by mouth 2 (two) times daily. 180 tablet 4  . sertraline (ZOLOFT) 50 MG tablet Take 1 tablet (50 mg total) by mouth daily. 90 tablet 4   No current facility-administered medications for this visit.      Marland Kitchen  PHYSICAL EXAMINATION: ECOG PERFORMANCE STATUS: 0 - Asymptomatic  Vitals:   01/21/21 1044  BP: (!) 175/90  Pulse: 84  Resp: 18  Temp: 98 F (36.7 C)  SpO2: 98%   Filed Weights   01/21/21 1044  Weight: 271 lb 3.2 oz (123 kg)    Physical Exam HENT:     Head: Normocephalic and atraumatic.     Mouth/Throat:     Pharynx: No oropharyngeal exudate.  Eyes:     Pupils: Pupils are equal, round, and reactive to light.  Cardiovascular:     Rate and  Rhythm: Normal rate and regular rhythm.  Pulmonary:     Effort: No respiratory distress.     Breath sounds: No wheezing.  Abdominal:     General: Bowel sounds are normal. There is no distension.     Palpations: Abdomen is soft. There is no mass.     Tenderness: There is no  abdominal tenderness. There is no guarding or rebound.  Musculoskeletal:        General: No tenderness. Normal range of motion.     Cervical back: Normal range of motion and neck supple.  Skin:    General: Skin is warm.  Neurological:     Mental Status: He is alert and oriented to person, place, and time.  Psychiatric:        Mood and Affect: Affect normal.      LABORATORY DATA:  I have reviewed the data as listed Lab Results  Component Value Date   WBC 6.8 01/21/2021   HGB 14.4 01/21/2021   HCT 41.9 01/21/2021   MCV 99.3 01/21/2021   PLT 289 01/21/2021   Recent Labs    06/17/20 0951 09/14/20 1345 01/21/21 1025  NA 142 139 139  K 4.1 4.1 4.5  CL 102 97 103  CO2 28 26 25   GLUCOSE 118* 107* 121*  BUN 18 15 20   CREATININE 0.92 0.90 0.85  CALCIUM 9.3 9.8 9.5  GFRNONAA >60 83 >60  GFRAA >60 96  --   PROT 7.5 7.3 7.6  ALBUMIN 4.1 4.7 4.4  AST 31 29 43*  ALT 26 28 35  ALKPHOS 67 85 64  BILITOT 0.9 0.8 0.9    RADIOGRAPHIC STUDIES: I have personally reviewed the radiological images as listed and agreed with the findings in the report. No results found.  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  Stable.  Continue monitoring without imaging.  # Multiple colon polyps/Barrett's surveillance Dr. Gustavo Lah- last EGD/Colo- Jan 2020.  Reviewed the EGD/pathology-no evidence of progression- STABLE; awaiting to call re; [facility].  # HTN-175/90- ? [at home 130-80s]; question situational.  Monitor  closely.  # weight gain- recommend weight loss- exericse/ diet [vegan]    # CAD s/p stenting [Dr.End ] s/p cardiac rehab-STABLE.   *ptpref # DISPOSITION: # follow up in 6 months MD/labs- cbc/cmp/ldh-Dr.B    All questions were answered. The patient knows to call the clinic with any problems, questions or concerns.    Cammie Sickle, MD 01/21/2021 11:46 AM

## 2021-01-21 NOTE — Assessment & Plan Note (Addendum)
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  Stable.  Continue monitoring without imaging.  # Multiple colon polyps/Barrett's surveillance Dr. Gustavo Lah- last EGD/Colo- Jan 2020.  Reviewed the EGD/pathology-no evidence of progression- STABLE; awaiting to call re; [facility].  # HTN-175/90- ? [at home 130-80s]; question situational.  Monitor closely.  # weight gain- recommend weight loss- exericse/ diet [vegan]    # CAD s/p stenting [Dr.End ] s/p cardiac rehab-STABLE.   *ptpref # DISPOSITION: # follow up in 6 months MD/labs- cbc/cmp/ldh-Dr.B

## 2021-02-23 ENCOUNTER — Other Ambulatory Visit: Payer: Self-pay

## 2021-02-23 ENCOUNTER — Ambulatory Visit (INDEPENDENT_AMBULATORY_CARE_PROVIDER_SITE_OTHER): Payer: Medicare Other | Admitting: Internal Medicine

## 2021-02-23 ENCOUNTER — Encounter: Payer: Self-pay | Admitting: Internal Medicine

## 2021-02-23 VITALS — BP 148/90 | HR 76 | Ht 71.0 in | Wt 270.2 lb

## 2021-02-23 DIAGNOSIS — I5032 Chronic diastolic (congestive) heart failure: Secondary | ICD-10-CM

## 2021-02-23 DIAGNOSIS — E785 Hyperlipidemia, unspecified: Secondary | ICD-10-CM

## 2021-02-23 DIAGNOSIS — I10 Essential (primary) hypertension: Secondary | ICD-10-CM | POA: Diagnosis not present

## 2021-02-23 DIAGNOSIS — I25118 Atherosclerotic heart disease of native coronary artery with other forms of angina pectoris: Secondary | ICD-10-CM

## 2021-02-23 MED ORDER — FUROSEMIDE 20 MG PO TABS: 20.0000 mg | ORAL_TABLET | Freq: Every day | ORAL | 2 refills | Status: DC

## 2021-02-23 NOTE — Patient Instructions (Signed)
Medication Instructions:  Your physician has recommended you make the following change in your medication:  STOP Aspirin.  *If you need a refill on your cardiac medications before your next appointment, please call your pharmacy*  Follow-Up: At [facility], you and your health needs are our priority.  As part of our continuing mission to provide you with exceptional heart care, we have created designated Provider Care Teams.  These Care Teams include your primary Cardiologist (physician) and Advanced Practice Providers (APPs -  Physician Assistants and Nurse Practitioners) who all work together to provide you with the care you need, when you need it.  We recommend signing up for the patient portal called "MyChart".  Sign up information is provided on this After Visit Summary.  MyChart is used to connect with patients for Virtual Visits (Telemedicine).  Patients are able to view lab/test results, encounter notes, upcoming appointments, etc.  Non-urgent messages can be sent to your provider as well.   To learn more about what you can do with MyChart, go to [email or web address].    Your next appointment:   6 month(s)  The format for your next appointment:   In Person  Provider:   You may see Nelva Bush, MD or one of the following Advanced Practice Providers on your designated Care Team:    Murray Hodgkins, NP  Christell Faith, PA-C  Marrianne Mood, PA-C  Cadence Gem, [place]  Laurann Montana, NP

## 2021-02-23 NOTE — Progress Notes (Signed)
Follow-up Outpatient Visit Date: 02/23/2021  Primary Care Provider: Venita Lick, NP [place] [id number]  Chief Complaint: Follow-up coronary artery disease and chronic HFpEF  HPI:  Mr. Sebesta is a 75 y.o. male with history of coronary artery diseasestatus post PCI to LCx and LAD, HFpEF, hypertension, Barrett's esophagus,MALT-lymphoma of colon,gout, BPH, and obesity, who presents for follow-up of coronary artery disease and HFpEF.  I last saw him in 08/2020, at which time he was feeling relatively well.  He had experienced mild dyspnea during a backpacking trip to [place].  He also reported very mild transient chest pain only lasting a few seconds every 3 to 4 weeks.  We did not pursue any further work-up.  Today, Mr. Sommers reports that he has been feeling fairly well.  He has not had any chest pain since our last visit.  He has stable exertional dyspnea and has been exercising regularly without limitations.  He notes that his balance has been off over the last few months.  He recently fell while moving a ladder at the beach and injured his right shin.  Coincidentally, he had also fallen out of bed the night before.  He still feels a little shaky on his feet.  He denies dizziness and lightheadedness as well as palpitations.  He remains compliant with his medications and reports that his home blood pressures are usually 133-143/78-84.  He notes having eaten more salt than usual over the last few days.  --------------------------------------------------------------------------------------------------  Cardiovascular History & Procedures: Cardiovascular Problems:  Coronary artery disease  Risk Factors:  Known CAD, hypertension, male gender, obesity, and age greater than 81  Cath/PCI:  PCI (02/03/2019): 1. Severe, heavily calcified mid LAD stenosis (80-90%), unchanged from diagnostic catheterization earlier this month. 2. Widely patent LCx stent. 3. Normal left  ventricular filling pressure. 4. Successful orbital atherectomy and IVUS-guided PCI to the mid LAD using a Resolute Onyx 3.0 x 15 mm drug-eluting stent (postdilated to 3.6 mm) with 0% residual stenosis and TIMI-3 flow.   LHC/PCI (01/21/2019): 1. Significant 2-vessel coronary artery disease, including 80-90% mid LAD stenosis with heavy calcification, as well as 70% proximal LCx stenosis. 2. Normal left ventricular systolic function. 3. Moderately elevated left ventricular filling pressure. 4. Successful PCI to proximal LCx using Resolute Onyx 2.75 x 15 mm drug-eluting stent (postdilated to 3.1 mm proximally) with 0% residual stenosis and TIMI-3 flow.  CV Surgery:  None  EP Procedures and Devices:  None  Non-Invasive Evaluation(s):  Cardiac CTA (12/23/2018): Multivessel CAD with hemodynamically significant disease involving the mid LAD and distal LCx.  Exercise tolerance test (11/06/2018): Intermediate risk study with 1 mm horizontal ST depressions and frequent PVCs during stress.   Recent CV Pertinent Labs: Lab Results  Component Value Date   CHOL 152 09/14/2020   CHOL 124 02/12/2019   HDL 83 09/14/2020   LDLCALC 57 09/14/2020   TRIG 55 09/14/2020   TRIG 58 02/12/2019   CHOLHDL 1.9 08/25/2020   K 4.5 01/21/2021   K 4.0 01/06/2014   MG 1.5 (L) 09/14/2020   BUN 20 01/21/2021   BUN 15 09/14/2020   BUN 22 (H) 01/06/2014   CREATININE 0.85 01/21/2021   CREATININE 1.13 01/06/2014    Past medical and surgical history were reviewed and updated in EPIC.  Current Meds  Medication Sig  . allopurinol (ZYLOPRIM) 300 MG tablet Take 1 tablet (300 mg total) by mouth daily.  Marland Kitchen amLODipine (NORVASC) 10 MG tablet Take 1 tablet (10  mg total) by mouth daily.  Marland Kitchen atorvastatin (LIPITOR) 40 MG tablet Take 1 tablet (40 mg total) by mouth daily.  . clopidogrel (PLAVIX) 75 MG tablet Take 1 tablet (75 mg total) by mouth daily with breakfast.  . ELDERBERRY PO Take 1 tablet by mouth 3 (three)  times a week.  Marland Kitchen LORazepam (ATIVAN) 1 MG tablet Take 0.5-1 tablets (0.5-1 mg total) by mouth daily as needed for anxiety.  Marland Kitchen losartan (COZAAR) 100 MG tablet Take 1 tablet (100 mg total) by mouth daily.  . metoprolol tartrate (LOPRESSOR) 25 MG tablet Take 1 tablet (25 mg total) by mouth 2 (two) times daily.  . Multiple Vitamin (MULTIVITAMIN) tablet Take 1 tablet by mouth daily.  . Multiple Vitamins-Minerals (EMERGEN-C IMMUNE PO) Take 1 tablet by mouth every other day.   . nitroGLYCERIN (NITROSTAT) 0.4 MG SL tablet Place 1 tablet (0.4 mg total) under the tongue every 5 (five) minutes as needed for chest pain. Maximum of 3 doses.  . Omega 3 1200 MG CAPS Take by mouth.  . pantoprazole (PROTONIX) 20 MG tablet Take 1 tablet (20 mg total) by mouth 2 (two) times daily.  . sertraline (ZOLOFT) 50 MG tablet Take 1 tablet (50 mg total) by mouth daily.  . [DISCONTINUED] aspirin EC 81 MG tablet Take 81 mg by mouth daily.  . [DISCONTINUED] furosemide (LASIX) 20 MG tablet Take 1 tablet (20 mg total) by mouth daily.    Allergies: Patient has no known allergies.  Social History   Tobacco Use  . Smoking status: Never Smoker  . Smokeless tobacco: Never Used  Vaping Use  . Vaping Use: Never used  Substance Use Topics  . Alcohol use: Yes    Alcohol/week: 7.0 standard drinks    Types: 7 Standard drinks or equivalent per week    Comment: moderate  . Drug use: No    Family History  Adopted: Yes  Problem Relation Age of Onset  . Heart attack Mother 76  . Pancreatic cancer Father        died in WWII - pt adopted  . Hypertension Sister   . Hyperlipidemia Sister   . Hypertension Brother   . Hyperlipidemia Brother   . Prostate cancer Neg Hx   . Kidney cancer Neg Hx   . Bladder Cancer Neg Hx     Review of Systems: A 12-system review of systems was performed and was negative except as noted in the  HPI.  --------------------------------------------------------------------------------------------------  Physical Exam: BP (!) 148/90 (BP Location: Left Arm, Patient Position: Sitting, Cuff Size: Large)   Pulse 76   Ht 5\' 11"  (1.803 m)   Wt 270 lb 4 oz (122.6 kg)   SpO2 98%   BMI 37.69 kg/m   General:  NAD. Neck: No JVD or HJR. Lungs: Clear to auscultation bilaterally without wheezes or crackles. Heart: Regular rate and rhythm without murmurs, rubs, or gallops. Abdomen: Soft, nontender, nondistended. Extremities: Trace pretibial edema bilaterally.  Bruising and hematoma noted overlying the right shin.  EKG: Normal sinus rhythm without abnormality.  Lab Results  Component Value Date   WBC 6.8 01/21/2021   HGB 14.4 01/21/2021   HCT 41.9 01/21/2021   MCV 99.3 01/21/2021   PLT 289 01/21/2021    Lab Results  Component Value Date   NA 139 01/21/2021   K 4.5 01/21/2021   CL 103 01/21/2021   CO2 25 01/21/2021   BUN 20 01/21/2021   CREATININE 0.85 01/21/2021   GLUCOSE 121 (H) 01/21/2021  ALT 35 01/21/2021    Lab Results  Component Value Date   CHOL 152 09/14/2020   HDL 83 09/14/2020   LDLCALC 57 09/14/2020   TRIG 55 09/14/2020   CHOLHDL 1.9 08/25/2020    --------------------------------------------------------------------------------------------------  ASSESSMENT AND PLAN: Coronary artery disease with stable angina: Mr. Jasperson continues to do well following multivessel PCI 2 years ago.  We will continue his current antianginal regimen of amlodipine and metoprolol.  Given that he is 2 years out from his PCI and has experienced some balance issues with right shin hematoma after falling recently, we have agreed to discontinue aspirin and continue single antiplatelet therapy with clopidogrel.  Chronic HFpEF: Mr. Goeller has stable chronic lower extremity edema with mild exertional dyspnea consistent with NYHA class II HFpEF.  We will defer medication changes today.  I  have encouraged him to minimize his sodium intake in the setting of chronic HFpEF as well as elevated blood pressure today.  We will plan to continue furosemide 20 mg daily for volume management.  Hypertension: Blood pressure mildly elevated today but typically better at home.  Pain from right shin hematoma as well as increased sodium over the last few days may be contributing.  I reinforced the importance of sodium restriction.  We will continue current regimen of amlodipine, metoprolol, and losartan.  Hyperlipidemia: LDL well controlled on last check in October.  Continue current regimen of atorvastatin and fish oil.  Follow-up: Return to clinic in 6 months.  Nelva Bush, MD 02/23/2021 12:49 PM

## 2021-03-11 ENCOUNTER — Encounter: Payer: Self-pay | Admitting: Nurse Practitioner

## 2021-03-11 DIAGNOSIS — Z79899 Other long term (current) drug therapy: Secondary | ICD-10-CM | POA: Insufficient documentation

## 2021-03-15 ENCOUNTER — Other Ambulatory Visit: Payer: Self-pay

## 2021-03-15 ENCOUNTER — Encounter: Payer: Self-pay | Admitting: Nurse Practitioner

## 2021-03-15 ENCOUNTER — Ambulatory Visit (INDEPENDENT_AMBULATORY_CARE_PROVIDER_SITE_OTHER): Payer: Medicare Other | Admitting: Nurse Practitioner

## 2021-03-15 VITALS — BP 132/78 | HR 78 | Temp 98.1°F | Wt 271.6 lb

## 2021-03-15 DIAGNOSIS — D692 Other nonthrombocytopenic purpura: Secondary | ICD-10-CM | POA: Diagnosis not present

## 2021-03-15 DIAGNOSIS — Z79899 Other long term (current) drug therapy: Secondary | ICD-10-CM | POA: Diagnosis not present

## 2021-03-15 DIAGNOSIS — F339 Major depressive disorder, recurrent, unspecified: Secondary | ICD-10-CM

## 2021-03-15 DIAGNOSIS — M10072 Idiopathic gout, left ankle and foot: Secondary | ICD-10-CM | POA: Diagnosis not present

## 2021-03-15 DIAGNOSIS — E785 Hyperlipidemia, unspecified: Secondary | ICD-10-CM | POA: Diagnosis not present

## 2021-03-15 DIAGNOSIS — C884 Extranodal marginal zone B-cell lymphoma of mucosa-associated lymphoid tissue [MALT-lymphoma]: Secondary | ICD-10-CM | POA: Diagnosis not present

## 2021-03-15 DIAGNOSIS — F419 Anxiety disorder, unspecified: Secondary | ICD-10-CM

## 2021-03-15 DIAGNOSIS — I5032 Chronic diastolic (congestive) heart failure: Secondary | ICD-10-CM | POA: Diagnosis not present

## 2021-03-15 DIAGNOSIS — I2510 Atherosclerotic heart disease of native coronary artery without angina pectoris: Secondary | ICD-10-CM

## 2021-03-15 DIAGNOSIS — R7309 Other abnormal glucose: Secondary | ICD-10-CM

## 2021-03-15 DIAGNOSIS — K2270 Barrett's esophagus without dysplasia: Secondary | ICD-10-CM | POA: Diagnosis not present

## 2021-03-15 DIAGNOSIS — I10 Essential (primary) hypertension: Secondary | ICD-10-CM | POA: Diagnosis not present

## 2021-03-15 DIAGNOSIS — Z87898 Personal history of other specified conditions: Secondary | ICD-10-CM | POA: Insufficient documentation

## 2021-03-15 MED ORDER — LORAZEPAM 1 MG PO TABS: 0.5000 mg | ORAL_TABLET | Freq: Every day | ORAL | 1 refills | Status: DC | PRN

## 2021-03-15 MED ORDER — ALLOPURINOL 300 MG PO TABS: 300.0000 mg | ORAL_TABLET | Freq: Every day | ORAL | 4 refills | Status: DC

## 2021-03-15 NOTE — Patient Instructions (Signed)
Call GI for endoscopy and colonoscopy --- last in January 2020 -- is due now  [place] stands for Dietary Approaches to Stop Hypertension. The DASH eating plan is a healthy eating plan that has been shown to:  Reduce high blood pressure (hypertension).  Reduce your risk for type 2 diabetes, heart disease, and stroke.  Help with weight loss. What are tips for following this plan? Reading food labels  Check food labels for the amount of salt (sodium) per serving. Choose foods with less than 5 percent of the Daily Value of sodium. Generally, foods with less than 300 milligrams (mg) of sodium per serving fit into this eating plan.  To find whole grains, look for the word "whole" as the first word in the ingredient list. Shopping  Buy products labeled as "low-sodium" or "no salt added."  Buy fresh foods. Avoid canned foods and pre-made or frozen meals. Cooking  Avoid adding salt when cooking. Use salt-free seasonings or herbs instead of table salt or sea salt. Check with your health care provider or pharmacist before using salt substitutes.  Do not fry foods. Cook foods using healthy methods such as baking, boiling, grilling, roasting, and broiling instead.  Cook with heart-healthy oils, such as olive, canola, avocado, soybean, or sunflower oil. Meal planning  Eat a balanced diet that includes: ? 4 or more servings of fruits and 4 or more servings of vegetables each day. Try to fill one-half of your plate with fruits and vegetables. ? 6-8 servings of whole grains each day. ? Less than 6 oz (170 g) of lean meat, poultry, or fish each day. A 3-oz (85-g) serving of meat is about the same size as a deck of cards. One egg equals 1 oz (28 g). ? 2-3 servings of low-fat dairy each day. One serving is 1 cup (237 mL). ? 1 serving of nuts, seeds, or beans 5 times each week. ? 2-3 servings of heart-healthy fats. Healthy fats called omega-3 fatty acids are found in foods such as  walnuts, flaxseeds, fortified milks, and eggs. These fats are also found in cold-water fish, such as sardines, salmon, and mackerel.  Limit how much you eat of: ? Canned or prepackaged foods. ? Food that is high in trans fat, such as some fried foods. ? Food that is high in saturated fat, such as fatty meat. ? Desserts and other sweets, sugary drinks, and other foods with added sugar. ? Full-fat dairy products.  Do not salt foods before eating.  Do not eat more than 4 egg yolks a week.  Try to eat at least 2 vegetarian meals a week.  Eat more home-cooked food and less restaurant, buffet, and fast food.   Lifestyle  When eating at a restaurant, ask that your food be prepared with less salt or no salt, if possible.  If you drink alcohol: ? Limit how much you use to:  0-1 drink a day for women who are not pregnant.  0-2 drinks a day for men. ? Be aware of how much alcohol is in your drink. In the [place] one drink equals one 12 oz bottle of beer (355 mL), one 5 oz glass of wine (148 mL), or one 1 oz glass of hard liquor (44 mL). General information  Avoid eating more than 2,300 mg of salt a day. If you have hypertension, you may need to reduce your sodium intake to 1,500 mg a day.  Work with your health care provider to maintain a  healthy body weight or to lose weight. Ask what an ideal weight is for you.  Get at least 30 minutes of exercise that causes your heart to beat faster (aerobic exercise) most days of the week. Activities may include walking, swimming, or biking.  Work with your health care provider or dietitian to adjust your eating plan to your individual calorie needs. What foods should I eat? Fruits All fresh, dried, or frozen fruit. Canned fruit in natural juice (without added sugar). Vegetables Fresh or frozen vegetables (raw, steamed, roasted, or grilled). Low-sodium or reduced-sodium tomato and vegetable juice. Low-sodium or reduced-sodium tomato sauce and tomato  paste. Low-sodium or reduced-sodium canned vegetables. Grains Whole-grain or whole-wheat bread. Whole-grain or whole-wheat pasta. Brown rice. [place]. Bulgur. Whole-grain and low-sodium cereals. Pita bread. Low-fat, low-sodium crackers. Whole-wheat flour tortillas. Meats and other proteins Skinless chicken or [place]. Ground chicken or [place]. Pork with fat trimmed off. Fish and seafood. Egg whites. Dried beans, peas, or lentils. Unsalted nuts, nut butters, and seeds. Unsalted canned beans. Lean cuts of beef with fat trimmed off. Low-sodium, lean precooked or cured meat, such as sausages or meat loaves. Dairy Low-fat (1%) or fat-free (skim) milk. Reduced-fat, low-fat, or fat-free cheeses. Nonfat, low-sodium ricotta or cottage cheese. Low-fat or nonfat yogurt. Low-fat, low-sodium cheese. Fats and oils Soft margarine without trans fats. Vegetable oil. Reduced-fat, low-fat, or light mayonnaise and salad dressings (reduced-sodium). Canola, safflower, olive, avocado, soybean, and sunflower oils. Avocado. Seasonings and condiments Herbs. Spices. Seasoning mixes without salt. Other foods Unsalted popcorn and pretzels. Fat-free sweets. The items listed above may not be a complete list of foods and beverages you can eat. Contact a dietitian for more information. What foods should I avoid? Fruits Canned fruit in a light or heavy syrup. Fried fruit. Fruit in cream or butter sauce. Vegetables Creamed or fried vegetables. Vegetables in a cheese sauce. Regular canned vegetables (not low-sodium or reduced-sodium). Regular canned tomato sauce and paste (not low-sodium or reduced-sodium). Regular tomato and vegetable juice (not low-sodium or reduced-sodium). Angie Fava. Olives. Grains Baked goods made with fat, such as croissants, muffins, or some breads. Dry pasta or rice meal packs. Meats and other proteins Fatty cuts of meat. Ribs. Fried meat. Berniece Salines. Bologna, salami, and other precooked or cured meats,  such as sausages or meat loaves. Fat from the back of a pig (fatback). Bratwurst. Salted nuts and seeds. Canned beans with added salt. Canned or smoked fish. Whole eggs or egg yolks. Chicken or [place] with skin. Dairy Whole or 2% milk, cream, and half-and-half. Whole or full-fat cream cheese. Whole-fat or sweetened yogurt. Full-fat cheese. Nondairy creamers. Whipped toppings. Processed cheese and cheese spreads. Fats and oils Butter. Stick margarine. Lard. Shortening. Ghee. Bacon fat. Tropical oils, such as coconut, palm kernel, or palm oil. Seasonings and condiments Onion salt, garlic salt, seasoned salt, table salt, and sea salt. Worcestershire sauce. Tartar sauce. Barbecue sauce. Teriyaki sauce. Soy sauce, including reduced-sodium. Steak sauce. Canned and packaged gravies. Fish sauce. Oyster sauce. Cocktail sauce. Store-bought horseradish. Ketchup. Mustard. Meat flavorings and tenderizers. Bouillon cubes. Hot sauces. Pre-made or packaged marinades. Pre-made or packaged taco seasonings. Relishes. Regular salad dressings. Other foods Salted popcorn and pretzels. The items listed above may not be a complete list of foods and beverages you should avoid. Contact a dietitian for more information. Where to find more information  National Heart, Lung, and Blood Institute: https://wilson-eaton.com/  American Heart Association: www.heart.org  Academy of Nutrition and Dietetics: www.eatright.[place]: www.kidney.org Summary  The DASH eating plan is a healthy eating plan that has been shown to reduce high blood pressure (hypertension). It may also reduce your risk for type 2 diabetes, heart disease, and stroke.  When on the DASH eating plan, aim to eat more fresh fruits and vegetables, whole grains, lean proteins, low-fat dairy, and heart-healthy fats.  With the DASH eating plan, you should limit salt (sodium) intake to 2,300 mg a day. If you have hypertension, you may need to reduce  your sodium intake to 1,500 mg a day.  Work with your health care provider or dietitian to adjust your eating plan to your individual calorie needs. This information is not intended to replace advice given to you by your health care provider. Make sure you discuss any questions you have with your health care provider. Document Revised: 10/31/2019 Document Reviewed: 10/31/2019 Elsevier Patient Education  2021 [employer].

## 2021-03-15 NOTE — Assessment & Plan Note (Signed)
Chronic, ongoing.  Continue collaboration with oncology, appreciate their ongoing input.

## 2021-03-15 NOTE — Assessment & Plan Note (Signed)
Discussed at length with patient and recommend continue to reduce use.  Refer to anxiety plan.  UDS and contract today.

## 2021-03-15 NOTE — Assessment & Plan Note (Signed)
Chronic, ongoing.  At length discussion on risks of long term benzo use, he wishes to continue treatment and reports using minimally.  Refills sent #30 pills with 1 refill.  This often lasts 6 months.  Return in 6 months.  UDS today and contract placed in chart.

## 2021-03-15 NOTE — Assessment & Plan Note (Signed)
Recommend gentle skin care at home and monitor for abrasions or skin breakdown, alert provider if present. 

## 2021-03-15 NOTE — Assessment & Plan Note (Signed)
Chronic, stable.  Denies SI/HI.  Continue current medication regimen and adjust as needed, benefit obtained from change to Zoloft in 2021 -- could increase dose to allow for further reduction of benzo use in future.  Recommend relaxation and meditation techniques at home.  Return in 6 months.

## 2021-03-15 NOTE — Assessment & Plan Note (Signed)
Chronic, stable.  No recent NTG use.  Continue current medication regimen and collaboration with cardiology.  To alert provider immediately if CP presents or go immediately to ER. 

## 2021-03-15 NOTE — Assessment & Plan Note (Signed)
Signed and placed in chart today.

## 2021-03-15 NOTE — Assessment & Plan Note (Signed)
Chronic, stable with no recent exacerbations.  Continue current medication regimen and collaboration with cardiology.  Recommend: - Reminded to call for an overnight weight gain of >2 pounds or a weekly weight weight of >5 pounds - not adding salt to his food and has been reading food labels. Reviewed the importance of keeping daily sodium intake to 2000mg  daily

## 2021-03-15 NOTE — Assessment & Plan Note (Signed)
Chronic, ongoing.  Continue current medication and adjust as needed.  Lipid panel today.

## 2021-03-15 NOTE — Assessment & Plan Note (Signed)
Chronic, stable with BP at goal on home readings and on repeat in office today.  Continue current medication regimen and adjust as needed, sees cardiology will continue this collaboration. Labs up to date.  He declines OSA testing.  Recommend he continue to monitor BP at home and document + focus on DASH diet and regular exercise.  Return in 6 months.

## 2021-03-15 NOTE — Assessment & Plan Note (Signed)
Chronic, stable.  Continue current Protonix dose and check Mag level next visit.  Recommend he call GI to schedule visit and screening, as is at 2 year mark when endoscopy is due.  Recommend he trial probiotic yogurt for occasional gas complaints.

## 2021-03-15 NOTE — Assessment & Plan Note (Signed)
Chronic, stable.  Controlled with Allopurinol and renal dose if needed.  Continue current medication regimen.  Lab next visit.

## 2021-03-15 NOTE — Assessment & Plan Note (Signed)
BMI 37.88 with underlying cardiac issues.  Recommended eating smaller high protein, low fat meals more frequently and exercising 30 mins a day 5 times a week with a goal of 10-15lb weight loss in the next 3 months. Patient voiced their understanding and motivation to adhere to these recommendations.

## 2021-03-15 NOTE — Progress Notes (Signed)
BP 132/78 (BP Location: Left Arm)   Pulse 78   Temp 98.1 F (36.7 C) (Oral)   Wt 271 lb 9.6 oz (123.2 kg)   SpO2 97%   BMI 37.88 kg/m    Subjective:    Patient ID: Jesse Norton, male    DOB: Jul 12, 1946, 75 y.o.   MRN: [id number]  HPI: Jesse Norton is a 75 y.o. male  Chief Complaint  Patient presents with  . Hypertension  . Hyperlipidemia  . Gastroesophageal Reflux  . Mood   HYPERTENSION / HYPERLIPIDEMIA/HF Continues on Losartan, Metoprolol, Lasix, Amlodipine, and Plavix.  Followed by cardiology for CAD, HFpEF and HTN, last seen 02/23/21 -- was taken of ASA and continued Plavix.  No recent NTG use, has not used at all. Last CMP noted glucose 121.  Continues on Allopurinol daily for history of gout flares, last uric acid 4.3.  Last flare several months ago. Satisfied with current treatment? yes Duration of hypertension: chronic BP monitoring frequency: daily BP range: 136-138/79-82 range at home over past month BP medication side effects: no Duration of hyperlipidemia: chronic Cholesterol medication side effects: no Cholesterol supplements: none Medication compliance: good compliance Aspirin: none Recent stressors: no Recurrent headaches: no Visual changes: no Palpitations: no Dyspnea: no Chest pain: no Lower extremity edema: no Dizzy/lightheaded: no   B-CELL LYMPHOMA: Followed by oncology and last seen 01/21/21.  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  Remains stable on review records.  Continues on Protonix for Barrett's esophagus -- has endoscopy every 2 years, his GI retired and needs to meet new GI at [facility] -- last 12/19/2018. Denies B symptoms.    ANXIETY/STRESS Continues on Ativan 0.5 to 1 MG as needed daily and Sertraline 50 MG.Takes Ativanas needed only, in about a week he may take one a day for two days. He often gets 4 refills a year. Pt isaware of risks of benzo  medication use to include increased sedation, respiratory suppression, falls, dependence and cardiovascular events. Ptwould like to continue treatment as benefit determined to outweigh risk.On review of PDMP last Ativan fill1/19/22for #30 tablets. Duration:uncontrolled Anxious mood:yes Excessive worrying:yes Irritability:yes Sweating:no Nausea:no Palpitations:no Hyperventilation:no Panic attacks:yes Agoraphobia:no Obscessions/compulsions:no Depressed mood:no Depression screen [facility] 2/9 03/15/2021 09/14/2020 06/21/2020 06/11/2020 02/17/2020  Decreased Interest 0 0 0 0 0  Down, Depressed, Hopeless 0 0 0 0 0  PHQ - 2 Score 0 0 0 0 0  Altered sleeping 0 0 1 0 1  Tired, decreased energy 0 0 1 0 1  Change in appetite 0 0 0 0 0  Feeling bad or failure about yourself  0 0 0 0 0  Trouble concentrating 0 0 0 0 0  Moving slowly or fidgety/restless 0 0 0 0 0  Suicidal thoughts 0 0 0 0 0  PHQ-9 Score 0 0 2 0 2  Difficult doing work/chores - Not difficult at all Not difficult at all Not difficult at all Not difficult at all  Some recent data might be hidden   GAD 7 : Generalized Anxiety Score 09/14/2020 06/21/2020 02/17/2020 08/19/2019  Nervous, Anxious, on Edge 0 1 1 0  Control/stop worrying 0 1 1 0  Worry too much - different things 0 1 0 1  Trouble relaxing 0 0 0 0  Restless 0 0 0 0  Easily annoyed or irritable 0 0 0 0  Afraid - awful might happen 0 0 0 0  Total GAD 7 Score  0 3 2 1   Anxiety Difficulty Not difficult at all Somewhat difficult Not difficult at all Not difficult at all    Relevant past medical, surgical, family and social history reviewed and updated as indicated. Interim medical history since our last visit reviewed. Allergies and medications reviewed and updated.  Review of Systems  Constitutional: Negative for activity change, diaphoresis, fatigue and fever.  Respiratory: Negative for cough, chest tightness, shortness of breath and wheezing.   Cardiovascular:  Negative for chest pain, palpitations and leg swelling.  Gastrointestinal: Negative.   Neurological: Negative.   Psychiatric/Behavioral: Negative for decreased concentration, self-injury, sleep disturbance and suicidal ideas. The patient is not nervous/anxious.     Per HPI unless specifically indicated above     Objective:    BP 132/78 (BP Location: Left Arm)   Pulse 78   Temp 98.1 F (36.7 C) (Oral)   Wt 271 lb 9.6 oz (123.2 kg)   SpO2 97%   BMI 37.88 kg/m   Wt Readings from Last 3 Encounters:  03/15/21 271 lb 9.6 oz (123.2 kg)  02/23/21 270 lb 4 oz (122.6 kg)  01/21/21 271 lb 3.2 oz (123 kg)    Physical Exam Vitals and nursing note reviewed.  Constitutional:      General: He is awake. He is not in acute distress.    Appearance: He is well-developed and well-groomed. He is morbidly obese. He is not ill-appearing.  HENT:     Head: Normocephalic and atraumatic.     Right Ear: Hearing normal. No drainage.     Left Ear: Hearing normal. No drainage.  Eyes:     General: Lids are normal.        Right eye: No discharge.        Left eye: No discharge.     Conjunctiva/sclera: Conjunctivae normal.     Pupils: Pupils are equal, round, and reactive to light.  Neck:     Vascular: No carotid bruit.     Trachea: Trachea normal.  Cardiovascular:     Rate and Rhythm: Normal rate and regular rhythm.     Heart sounds: Normal heart sounds, S1 normal and S2 normal. No murmur heard. No gallop.   Pulmonary:     Effort: Pulmonary effort is normal. No accessory muscle usage or respiratory distress.     Breath sounds: Normal breath sounds.  Abdominal:     General: Bowel sounds are normal.     Palpations: Abdomen is soft.  Musculoskeletal:        General: Normal range of motion.     Cervical back: Normal range of motion and neck supple.     Right lower leg: No edema.     Left lower leg: No edema.  Skin:    General: Skin is warm and dry.     Capillary Refill: Capillary refill takes  less than 2 seconds.     Comments: Scattered pale purple bruises bilateral upper extremities noted.  Neurological:     Mental Status: He is alert and oriented to person, place, and time.  Psychiatric:        Attention and Perception: Attention normal.        Mood and Affect: Mood normal.        Speech: Speech normal.        Behavior: Behavior normal. Behavior is cooperative.        Thought Content: Thought content normal.    Results for orders placed or performed in visit on 01/21/21  Lactate  dehydrogenase  Result Value Ref Range   LDH 176 98 - 192 U/L  Comprehensive metabolic panel  Result Value Ref Range   Sodium 139 135 - 145 mmol/L   Potassium 4.5 3.5 - 5.1 mmol/L   Chloride 103 98 - 111 mmol/L   CO2 25 22 - 32 mmol/L   Glucose, Bld 121 (H) 70 - 99 mg/dL   BUN 20 8 - 23 mg/dL   Creatinine, Ser 0.85 0.61 - 1.24 mg/dL   Calcium 9.5 8.9 - 10.3 mg/dL   Total Protein 7.6 6.5 - 8.1 g/dL   Albumin 4.4 3.5 - 5.0 g/dL   AST 43 (H) 15 - 41 U/L   ALT 35 0 - 44 U/L   Alkaline Phosphatase 64 38 - 126 U/L   Total Bilirubin 0.9 0.3 - 1.2 mg/dL   GFR, Estimated >60 >60 mL/min   Anion gap 11 5 - 15  CBC with Differential  Result Value Ref Range   WBC 6.8 4.0 - 10.5 K/uL   RBC 4.22 4.22 - 5.81 MIL/uL   Hemoglobin 14.4 13.0 - 17.0 g/dL   HCT 41.9 39.0 - 52.0 %   MCV 99.3 80.0 - 100.0 fL   MCH 34.1 (H) 26.0 - 34.0 pg   MCHC 34.4 30.0 - 36.0 g/dL   RDW 13.0 11.5 - 15.5 %   Platelets 289 150 - 400 K/uL   nRBC 0.6 (H) 0.0 - 0.2 %   Neutrophils Relative % 43 %   Neutro Abs 2.9 1.7 - 7.7 K/uL   Lymphocytes Relative 39 %   Lymphs Abs 2.6 0.7 - 4.0 K/uL   Monocytes Relative 12 %   Monocytes Absolute 0.8 0.1 - 1.0 K/uL   Eosinophils Relative 3 %   Eosinophils Absolute 0.2 0.0 - 0.5 K/uL   Basophils Relative 2 %   Basophils Absolute 0.1 0.0 - 0.1 K/uL   Immature Granulocytes 1 %   Abs Immature Granulocytes 0.07 0.00 - 0.07 K/uL      Assessment & Plan:   Problem List Items  Addressed This Visit      Cardiovascular and Mediastinum   Essential hypertension    Chronic, stable with BP at goal on home readings and on repeat in office today.  Continue current medication regimen and adjust as needed, sees cardiology will continue this collaboration. Labs up to date.  He declines OSA testing.  Recommend he continue to monitor BP at home and document + focus on DASH diet and regular exercise.  Return in 6 months.      Coronary artery disease involving native coronary artery of native heart without angina pectoris    Chronic, stable.  No recent NTG use.  Continue current medication regimen and collaboration with cardiology.  To alert provider immediately if CP presents or go immediately to ER.      Purpura senilis ([place])    Recommend gentle skin care at home and monitor for abrasions or skin breakdown, alert provider if present.      Chronic heart failure with preserved ejection fraction (HCC)    Chronic, stable with no recent exacerbations.  Continue current medication regimen and collaboration with cardiology.  Recommend: - Reminded to call for an overnight weight gain of >2 pounds or a weekly weight weight of >5 pounds - not adding salt to his food and has been reading food labels. Reviewed the importance of keeping daily sodium intake to 2000mg  daily         Digestive  Barrett's esophagus    Chronic, stable.  Continue current Protonix dose and check Mag level next visit.  Recommend he call GI to schedule visit and screening, as is at 2 year mark when endoscopy is due.  Recommend he trial probiotic yogurt for occasional gas complaints.        Other   Acute anxiety    Chronic, ongoing.  At length discussion on risks of long term benzo use, he wishes to continue treatment and reports using minimally.  Refills sent #30 pills with 1 refill.  This often lasts 6 months.  Return in 6 months.  UDS today and contract placed in chart.      Relevant Medications    LORazepam (ATIVAN) 1 MG tablet   Gout    Chronic, stable.  Controlled with Allopurinol and renal dose if needed.  Continue current medication regimen.  Lab next visit.      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  Continue collaboration with oncology, appreciate their ongoing input.      Relevant Medications   allopurinol (ZYLOPRIM) 300 MG tablet   LORazepam (ATIVAN) 1 MG tablet   Depression, recurrent (HCC)    Chronic, stable.  Denies SI/HI.  Continue current medication regimen and adjust as needed, benefit obtained from change to Zoloft in 2021 -- could increase dose to allow for further reduction of benzo use in future.  Recommend relaxation and meditation techniques at home.  Return in 6 months.      Relevant Medications   LORazepam (ATIVAN) 1 MG tablet   Hyperlipidemia LDL goal <70    Chronic, ongoing.  Continue current medication and adjust as needed.  Lipid panel today.      Relevant Orders   Lipid Panel w/o Chol/HDL Ratio   Morbid obesity (HCC)    BMI 37.88 with underlying cardiac issues.  Recommended eating smaller high protein, low fat meals more frequently and exercising 30 mins a day 5 times a week with a goal of 10-15lb weight loss in the next 3 months. Patient voiced their understanding and motivation to adhere to these recommendations.       Controlled substance agreement signed    Signed and placed in chart today.      Long-term current use of benzodiazepine    Discussed at length with patient and recommend continue to reduce use.  Refer to anxiety plan.  UDS and contract today.      Relevant Orders   [id number] 11+Oxyco+Alc+Crt-Bund    Other Visit Diagnoses    Elevated glucose       Noted on past labs, check A1c today   Relevant Orders   HgB A1c       Follow up plan: Return in about 6 months (around 09/14/2021) for Annual physical.

## 2021-03-16 LAB — LIPID PANEL W/O CHOL/HDL RATIO
Cholesterol, Total: 142 mg/dL (ref 100–199)
HDL: 67 mg/dL (ref >39)
LDL Chol Calc (NIH): 63 mg/dL (ref 0–99)
Triglycerides: 55 mg/dL (ref 0–149)
VLDL Cholesterol Cal: 12 mg/dL (ref 5–40)

## 2021-03-16 LAB — HEMOGLOBIN A1C
Est. average glucose Bld gHb Est-mCnc: 128 mg/dL
Hgb A1c MFr Bld: 6.1 % — ABNORMAL HIGH (ref 4.8–5.6)

## 2021-03-16 NOTE — Progress Notes (Signed)
Contacted via [place] afternoon [address], your labs have returned.  Cholesterol levels are fantastic, continue current medication.  The A1C is the diabetes testing we talked about, this looks at your blood sugars over the past 3 months and turns the average into a number.  Your number is 6.1%, meaning you are prediabetic.  Any number 5.7 to 6.4 is considered prediabetes and any number 6.5 or greater is considered diabetes.   I would recommend heavy focus on decreasing foods high in sugar and your intake of things like bread products, pasta, and rice.  The American Diabetes Association online has a large amount of information on diet changes to make.  We will recheck this number in 3 months to ensure you are not continuing to trend upwards and move into diabetes.  Have a good day. Any questions? Keep being awesome!!  Thank you for allowing me to participate in your care. Kindest regards, Cedra Villalon

## 2021-03-20 LAB — DRUG SCREEN 764883 11+OXYCO+ALC+CRT-BUND
BENZODIAZ UR QL: NEGATIVE ng/mL
Barbiturate: NEGATIVE ng/mL
Cannabinoid Quant, Ur: NEGATIVE ng/mL
Cocaine (Metabolite): NEGATIVE ng/mL
Creatinine: 128.5 mg/dL (ref 20.0–300.0)
Ethanol: NEGATIVE %
Meperidine: NEGATIVE ng/mL
Methadone Screen, Urine: NEGATIVE ng/mL
OPIATE SCREEN URINE: NEGATIVE ng/mL
Oxycodone/Oxymorphone, Urine: NEGATIVE ng/mL
Phencyclidine: NEGATIVE ng/mL
Propoxyphene: NEGATIVE ng/mL
Tramadol: NEGATIVE ng/mL
pH, Urine: 8.2 (ref 4.5–8.9)

## 2021-03-20 LAB — DRUG PROFILE 799016: Amphetamines: NEGATIVE

## 2021-03-21 DIAGNOSIS — D2262 Melanocytic nevi of left upper limb, including shoulder: Secondary | ICD-10-CM | POA: Diagnosis not present

## 2021-03-21 DIAGNOSIS — D2261 Melanocytic nevi of right upper limb, including shoulder: Secondary | ICD-10-CM | POA: Diagnosis not present

## 2021-03-21 DIAGNOSIS — D224 Melanocytic nevi of scalp and neck: Secondary | ICD-10-CM | POA: Diagnosis not present

## 2021-03-21 DIAGNOSIS — L821 Other seborrheic keratosis: Secondary | ICD-10-CM | POA: Diagnosis not present

## 2021-03-21 DIAGNOSIS — D2272 Melanocytic nevi of left lower limb, including hip: Secondary | ICD-10-CM | POA: Diagnosis not present

## 2021-03-21 DIAGNOSIS — D225 Melanocytic nevi of trunk: Secondary | ICD-10-CM | POA: Diagnosis not present

## 2021-03-21 DIAGNOSIS — D2271 Melanocytic nevi of right lower limb, including hip: Secondary | ICD-10-CM | POA: Diagnosis not present

## 2021-04-13 ENCOUNTER — Encounter: Payer: Self-pay | Admitting: Nurse Practitioner

## 2021-04-13 ENCOUNTER — Telehealth (INDEPENDENT_AMBULATORY_CARE_PROVIDER_SITE_OTHER): Payer: Medicare Other | Admitting: Nurse Practitioner

## 2021-04-13 DIAGNOSIS — R059 Cough, unspecified: Secondary | ICD-10-CM | POA: Diagnosis not present

## 2021-04-13 DIAGNOSIS — R051 Acute cough: Secondary | ICD-10-CM | POA: Insufficient documentation

## 2021-04-13 MED ORDER — BENZONATATE 100 MG PO CAPS: 100.0000 mg | ORAL_CAPSULE | Freq: Three times a day (TID) | ORAL | 0 refills | Status: DC

## 2021-04-13 MED ORDER — PREDNISONE 20 MG PO TABS: 40.0000 mg | ORAL_TABLET | Freq: Every day | ORAL | 0 refills | Status: AC

## 2021-04-13 MED ORDER — AMOXICILLIN-POT CLAVULANATE 875-125 MG PO TABS: 1.0000 | ORAL_TABLET | Freq: Two times a day (BID) | ORAL | 0 refills | Status: AC

## 2021-04-13 NOTE — Progress Notes (Signed)
BP 132/79   Pulse 82    Subjective:    Patient ID: Jesse Norton, male    DOB: [birth year], 75 y.o.   MRN: [id number]  HPI: Jesse Norton is a 75 y.o. male  Chief Complaint  Patient presents with  . Cough  . Nasal Congestion  . Head Congestion  . Chest Congestion    Patient states he became symptomatic 6 days ago. Patient states he is coughing up mucus and phlegm. Patient states he has tried OTC medications and nothing has helped. Patient denies having any fevers.     . This visit was completed via video visit through MyChart due to the restrictions of the COVID-19 pandemic. All issues as above were discussed and addressed. Physical exam was done as above through visual confirmation on video through MyChart. If it was felt that the patient should be evaluated in the office, they were directed there. The patient verbally consented to this visit. . Location of the patient: home . Location of the provider: work . Those involved with this call:  . Provider: Marnee Guarneri, DNP . CMA: Irena Reichmann, CMA . Front Desk/Registration: Jill Side  . Time spent on call: 21 minutes with patient face to face via video conference. More than 50% of this time was spent in counseling and coordination of care. 15 minutes total spent in review of patient's record and preparation of their chart.   UPPER RESPIRATORY TRACT INFECTION Symptoms started about 6 days ago, using OTC medications without much benefit.  Covid vaccinated x 3.  Did a home test and was Covid negative.   Fever: no Cough: yes Shortness of breath: no Wheezing: yes Chest pain: no Chest tightness: no Chest congestion: yes Nasal congestion: yes Runny nose: yes Post nasal drip: yes Sneezing: yes Sore throat: no Swollen glands: no Sinus pressure: yes Headache: no Face pain: no Toothache: no Ear pain: none Ear pressure: none Eyes red/itching:no Eye drainage/crusting: no  Vomiting: no Rash: no Fatigue: yes Sick  contacts: no Strep contacts: no  Context: fluctuating Recurrent sinusitis: no Relief with OTC cold/cough medications: no  Treatments attempted: cold/sinus and cough syrup   Relevant past medical, surgical, family and social history reviewed and updated as indicated. Interim medical history since our last visit reviewed. Allergies and medications reviewed and updated.  Review of Systems  Constitutional: Positive for fatigue. Negative for activity change, appetite change, diaphoresis and fever.  HENT: Positive for congestion, postnasal drip, rhinorrhea, sinus pressure and sneezing. Negative for ear discharge, ear pain, sinus pain, sore throat and voice change.   Respiratory: Positive for cough and wheezing. Negative for chest tightness and shortness of breath.   Cardiovascular: Negative for chest pain, palpitations and leg swelling.  Gastrointestinal: Negative.   Neurological: Negative.   Psychiatric/Behavioral: Negative.     Per HPI unless specifically indicated above     Objective:    BP 132/79   Pulse 82   Wt Readings from Last 3 Encounters:  03/15/21 271 lb 9.6 oz (123.2 kg)  02/23/21 270 lb 4 oz (122.6 kg)  01/21/21 271 lb 3.2 oz (123 kg)    Physical Exam Vitals and nursing note reviewed.  Constitutional:      General: He is awake. He is not in acute distress.    Appearance: He is well-developed and well-groomed. He is obese. He is not ill-appearing or toxic-appearing.  HENT:     Head: Normocephalic.     Right Ear: Hearing normal. No drainage.  Left Ear: Hearing normal. No drainage.  Eyes:     General: Lids are normal.        Right eye: No discharge.        Left eye: No discharge.     Conjunctiva/sclera: Conjunctivae normal.  Pulmonary:     Effort: Pulmonary effort is normal. No accessory muscle usage or respiratory distress.  Musculoskeletal:     Cervical back: Normal range of motion.  Neurological:     Mental Status: He is alert and oriented to person,  place, and time.  Psychiatric:        Mood and Affect: Mood normal.        Behavior: Behavior normal. Behavior is cooperative.        Thought Content: Thought content normal.        Judgment: Judgment normal.     Results for orders placed or performed in visit on 03/15/21  Lipid Panel w/o Chol/HDL Ratio  Result Value Ref Range   Cholesterol, Total 142 100 - 199 mg/dL   Triglycerides 55 0 - 149 mg/dL   HDL 67 >39 mg/dL   VLDL Cholesterol Cal 12 5 - 40 mg/dL   LDL Chol Calc (NIH) 63 0 - 99 mg/dL  HgB A1c  Result Value Ref Range   Hgb A1c MFr Bld 6.1 (H) 4.8 - 5.6 %   Est. average glucose Bld gHb Est-mCnc 128 mg/dL  [id number] 11+Oxyco+Alc+Crt-Bund  Result Value Ref Range   Ethanol Negative Cutoff=0.020 %   Amphetamines, Urine See Final Results Cutoff=1000 ng/mL   Barbiturate Negative Cutoff=200 ng/mL   BENZODIAZ UR QL Negative Cutoff=200 ng/mL   Cannabinoid Quant, Ur Negative Cutoff=50 ng/mL   Cocaine (Metabolite) Negative Cutoff=300 ng/mL   OPIATE SCREEN URINE Negative Cutoff=300 ng/mL   Oxycodone/Oxymorphone, Urine Negative Cutoff=300 ng/mL   Phencyclidine Negative Cutoff=25 ng/mL   Methadone Screen, Urine Negative Cutoff=300 ng/mL   Propoxyphene Negative Cutoff=300 ng/mL   Meperidine Negative Cutoff=200 ng/mL   Tramadol Negative Cutoff=200 ng/mL   Creatinine 128.5 20.0 - 300.0 mg/dL   pH, Urine 8.2 4.5 - 8.9  Drug Profile [phone]  Result Value Ref Range   Amphetamines Negative Cutoff=1000      Assessment & Plan:   Problem List Items Addressed This Visit      Other   Cough    Present for 6 days with negative home Covid testing and vaccinated x 3.  At this time offered PCR test for Covid at office, which he declines.  Recommend he self quarantine until symptoms improve.  Due to length of symptoms, age, and risk factors with chronic disease will treat at this time with Augmentin BID x 7 days, Prednisone 40 MG daily x 5 days, and Tessalon, scripts sent.  Recommend: -  Increased rest - Increasing Fluids - Acetaminophen as needed for fever/pain.  - Coricidin for symptom relief and educated him on this with his HF and HTN - Mucinex.  - Saline sinus flushes or a neti pot.  - Humidifying the air Return to office in one week for lung check.         I discussed the assessment and treatment plan with the patient. The patient was provided an opportunity to ask questions and all were answered. The patient agreed with the plan and demonstrated an understanding of the instructions.   The patient was advised to call back or seek an in-person evaluation if the symptoms worsen or if the condition fails to improve as anticipated.   I provided  21+ minutes of time during this encounter.  Follow up plan: Return in about 1 week (around 04/20/2021) for Cough and lung check.

## 2021-04-13 NOTE — Patient Instructions (Signed)

## 2021-04-13 NOTE — Assessment & Plan Note (Signed)
Present for 6 days with negative home Covid testing and vaccinated x 3.  At this time offered PCR test for Covid at office, which he declines.  Recommend he self quarantine until symptoms improve.  Due to length of symptoms, age, and risk factors with chronic disease will treat at this time with Augmentin BID x 7 days, Prednisone 40 MG daily x 5 days, and Tessalon, scripts sent.  Recommend: - Increased rest - Increasing Fluids - Acetaminophen as needed for fever/pain.  - Coricidin for symptom relief and educated him on this with his HF and HTN - Mucinex.  - Saline sinus flushes or a neti pot.  - Humidifying the air Return to office in one week for lung check.

## 2021-04-19 ENCOUNTER — Encounter: Payer: Self-pay | Admitting: Nurse Practitioner

## 2021-04-19 ENCOUNTER — Telehealth: Payer: Self-pay

## 2021-04-19 NOTE — Telephone Encounter (Signed)
Lvm to r/s 5/17 appt 

## 2021-04-26 ENCOUNTER — Ambulatory Visit: Payer: Medicare Other | Admitting: Nurse Practitioner

## 2021-05-10 ENCOUNTER — Other Ambulatory Visit: Payer: Self-pay | Admitting: Unknown Physician Specialty

## 2021-05-10 ENCOUNTER — Other Ambulatory Visit: Payer: Self-pay

## 2021-05-10 ENCOUNTER — Ambulatory Visit (INDEPENDENT_AMBULATORY_CARE_PROVIDER_SITE_OTHER): Payer: Medicare Other | Admitting: Nurse Practitioner

## 2021-05-10 ENCOUNTER — Encounter: Payer: Self-pay | Admitting: Nurse Practitioner

## 2021-05-10 DIAGNOSIS — R059 Cough, unspecified: Secondary | ICD-10-CM | POA: Diagnosis not present

## 2021-05-10 DIAGNOSIS — J301 Allergic rhinitis due to pollen: Secondary | ICD-10-CM | POA: Diagnosis not present

## 2021-05-10 DIAGNOSIS — I2510 Atherosclerotic heart disease of native coronary artery without angina pectoris: Secondary | ICD-10-CM

## 2021-05-10 DIAGNOSIS — D692 Other nonthrombocytopenic purpura: Secondary | ICD-10-CM | POA: Diagnosis not present

## 2021-05-10 NOTE — Telephone Encounter (Signed)
Requested medication (s) are due for refill today:   Yes  Requested medication (s) are on the active medication list:   Yes  Future visit scheduled:   No   Last ordered: 07/14/2020 #180, 3 refills  Returned because needs a new Rx.   Hers has expired.     Requested Prescriptions  Pending Prescriptions Disp Refills   metoprolol tartrate (LOPRESSOR) 25 MG tablet [Pharmacy Med Name: METOPROLOL TARTRATE 25MG  TABLETS] 180 tablet 3    Sig: TAKE 1 TABLET(25 MG) BY MOUTH TWICE DAILY      Cardiovascular:  Beta Blockers Passed - 05/10/2021  8:09 AM      Passed - Last BP in normal range    BP Readings from Last 1 Encounters:  05/10/21 134/72          Passed - Last Heart Rate in normal range    Pulse Readings from Last 1 Encounters:  05/10/21 73          Passed - Valid encounter within last 6 months    Recent Outpatient Visits           Today Morbid obesity ([place])   [place], [place] T, NP   3 weeks ago Cough   [place], [place] T, NP   1 month ago 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

## 2021-05-10 NOTE — Patient Instructions (Signed)

## 2021-05-10 NOTE — Assessment & Plan Note (Addendum)
Ongoing, suspect reason for recent cough.  Recommend he take daily Claritin or Allegra OTC, avoid Benadryl due to age.  Educated on BEERS criteria and recommend he look this up.  May also use OTC Flonase.  Return as schedule in October, sooner if worsening symptoms.

## 2021-05-10 NOTE — Assessment & Plan Note (Signed)
Recommend gentle skin care at home and monitor for abrasions or skin breakdown, alert provider if present. 

## 2021-05-10 NOTE — Assessment & Plan Note (Signed)
Acute and improved at this time post treatment.  Lungs clear on exam today.  Suspect some underlying allergic rhinitis, due to ongoing rhinorrhea and tearing.  Recommend he take daily Claritin or Allegra OTC, avoid Benadryl due to age.  Educated on BEERS criteria and recommend he look this up.  Return as schedule in October, sooner if return of symptoms presents.

## 2021-05-10 NOTE — Progress Notes (Signed)
BP 134/72 (BP Location: Left Arm)   Pulse 73   Temp 98 F (36.7 C) (Oral)   Wt 273 lb 12.8 oz (124.2 kg)   SpO2 96%   BMI 38.19 kg/m    Subjective:    Patient ID: Jesse Norton, male    DOB: 06/30/1946, 75 y.o.   MRN: [id number]  HPI: Jesse Norton is a 75 y.o. male  Chief Complaint  Patient presents with  . Lung Check    Patient states he is feeling much better.    UPPER RESPIRATORY TRACT INFECTION Cough follow-up, initially seen virtually 04/13/21.  Symptoms started about 6 days prior to visit, used OTC medications without much benefit.  Covid vaccinated x 3.  Did a home test and was Covid negative.  Was treated with Augmentin and Prednisone + Tessalon.  He reports cough and symptoms have improved, started improving 4 days after treatment. Fever: no Cough: none Shortness of breath: no Wheezing: none Chest pain: no Chest tightness: no Chest congestion: none Nasal congestion: none Runny nose: yes Post nasal drip: yes Sneezing: yes Sore throat: no Swollen glands: no Sinus pressure: none Headache: no Face pain: no Toothache: no Ear pain: none Ear pressure: none Eyes red/itching:no Eye drainage/crusting: no  Vomiting: no Rash: no Fatigue: yes Sick contacts: no Strep contacts: no  Context: improved Recurrent sinusitis: no Relief with OTC cold/cough medications: no  Treatments attempted: abx treatment, Prednisone, and Tessalon  Relevant past medical, surgical, family and social history reviewed and updated as indicated. Interim medical history since our last visit reviewed. Allergies and medications reviewed and updated.  Review of Systems  Constitutional: Negative for activity change, appetite change, diaphoresis, fatigue and fever.  HENT: Positive for postnasal drip and rhinorrhea. Negative for congestion, ear discharge, ear pain, sinus pressure, sinus pain, sore throat and voice change.   Respiratory: Negative.   Cardiovascular: Negative.    Neurological: Negative.   Psychiatric/Behavioral: Negative.     Per HPI unless specifically indicated above     Objective:    BP 134/72 (BP Location: Left Arm)   Pulse 73   Temp 98 F (36.7 C) (Oral)   Wt 273 lb 12.8 oz (124.2 kg)   SpO2 96%   BMI 38.19 kg/m   Wt Readings from Last 3 Encounters:  05/10/21 273 lb 12.8 oz (124.2 kg)  03/15/21 271 lb 9.6 oz (123.2 kg)  02/23/21 270 lb 4 oz (122.6 kg)    Physical Exam Vitals and nursing note reviewed.  Constitutional:      General: He is awake. He is not in acute distress.    Appearance: He is well-developed and well-groomed. He is morbidly obese. He is not ill-appearing or toxic-appearing.  HENT:     Head: Normocephalic and atraumatic.     Right Ear: Hearing normal. No drainage.     Left Ear: Hearing normal. No drainage.  Eyes:     General: Lids are normal.        Right eye: No discharge.        Left eye: No discharge.     Conjunctiva/sclera:     Right eye: Right conjunctiva is injected.     Left eye: Left conjunctiva is injected.     Pupils: Pupils are equal, round, and reactive to light.     Comments: Slight weepy eyes bilateral -- tearing.  Neck:     Vascular: No carotid bruit.     Trachea: Trachea normal.  Cardiovascular:     Rate  and Rhythm: Normal rate and regular rhythm.     Heart sounds: Normal heart sounds, S1 normal and S2 normal. No murmur heard. No gallop.   Pulmonary:     Effort: Pulmonary effort is normal. No accessory muscle usage or respiratory distress.     Breath sounds: Normal breath sounds.  Abdominal:     General: Bowel sounds are normal.     Palpations: Abdomen is soft.  Musculoskeletal:        General: Normal range of motion.     Cervical back: Normal range of motion and neck supple.     Right lower leg: No edema.     Left lower leg: No edema.  Skin:    General: Skin is warm and dry.     Capillary Refill: Capillary refill takes less than 2 seconds.     Comments: Scattered pale purple  bruises bilateral upper extremities noted.  Neurological:     Mental Status: He is alert and oriented to person, place, and time.  Psychiatric:        Attention and Perception: Attention normal.        Mood and Affect: Mood normal.        Speech: Speech normal.        Behavior: Behavior normal. Behavior is cooperative.        Thought Content: Thought content normal.    Results for orders placed or performed in visit on 03/15/21  Lipid Panel w/o Chol/HDL Ratio  Result Value Ref Range   Cholesterol, Total 142 100 - 199 mg/dL   Triglycerides 55 0 - 149 mg/dL   HDL 67 >39 mg/dL   VLDL Cholesterol Cal 12 5 - 40 mg/dL   LDL Chol Calc (NIH) 63 0 - 99 mg/dL  HgB A1c  Result Value Ref Range   Hgb A1c MFr Bld 6.1 (H) 4.8 - 5.6 %   Est. average glucose Bld gHb Est-mCnc 128 mg/dL  [id number] 11+Oxyco+Alc+Crt-Bund  Result Value Ref Range   Ethanol Negative Cutoff=0.020 %   Amphetamines, Urine See Final Results Cutoff=1000 ng/mL   Barbiturate Negative Cutoff=200 ng/mL   BENZODIAZ UR QL Negative Cutoff=200 ng/mL   Cannabinoid Quant, Ur Negative Cutoff=50 ng/mL   Cocaine (Metabolite) Negative Cutoff=300 ng/mL   OPIATE SCREEN URINE Negative Cutoff=300 ng/mL   Oxycodone/Oxymorphone, Urine Negative Cutoff=300 ng/mL   Phencyclidine Negative Cutoff=25 ng/mL   Methadone Screen, Urine Negative Cutoff=300 ng/mL   Propoxyphene Negative Cutoff=300 ng/mL   Meperidine Negative Cutoff=200 ng/mL   Tramadol Negative Cutoff=200 ng/mL   Creatinine 128.5 20.0 - 300.0 mg/dL   pH, Urine 8.2 4.5 - 8.9  Drug Profile [id number]  Result Value Ref Range   Amphetamines Negative Cutoff=1000      Assessment & Plan:   Problem List Items Addressed This Visit      Cardiovascular and Mediastinum   Purpura senilis ([place])    Recommend gentle skin care at home and monitor for abrasions or skin breakdown, alert provider if present.        Respiratory   Allergic rhinitis due to pollen    Ongoing, suspect reason for  recent cough.  Recommend he take daily Claritin or Allegra OTC, avoid Benadryl due to age.  Educated on BEERS criteria and recommend he look this up.  May also use OTC Flonase.  Return as schedule in October, sooner if worsening symptoms.         Other   Morbid obesity ([place]) - Primary    BMI 38.19 with underlying  cardiac issues.  Recommended eating smaller high protein, low fat meals more frequently and exercising 30 mins a day 5 times a week with a goal of 10-15lb weight loss in the next 3 months. Patient voiced their understanding and motivation to adhere to these recommendations.       Cough    Acute and improved at this time post treatment.  Lungs clear on exam today.  Suspect some underlying allergic rhinitis, due to ongoing rhinorrhea and tearing.  Recommend he take daily Claritin or Allegra OTC, avoid Benadryl due to age.  Educated on BEERS criteria and recommend he look this up.  Return as schedule in October, sooner if return of symptoms presents.          Follow up plan: Return for as scheduled in October.

## 2021-05-10 NOTE — Assessment & Plan Note (Signed)
BMI 38.19 with underlying cardiac issues.  Recommended eating smaller high protein, low fat meals more frequently and exercising 30 mins a day 5 times a week with a goal of 10-15lb weight loss in the next 3 months. Patient voiced their understanding and motivation to adhere to these recommendations.

## 2021-05-11 NOTE — Telephone Encounter (Signed)
Scheduled 10/6 for cpe

## 2021-06-16 ENCOUNTER — Ambulatory Visit: Payer: Medicare Other

## 2021-06-20 ENCOUNTER — Ambulatory Visit (INDEPENDENT_AMBULATORY_CARE_PROVIDER_SITE_OTHER): Payer: Medicare Other

## 2021-06-20 VITALS — Ht 71.0 in | Wt 250.0 lb

## 2021-06-20 DIAGNOSIS — Z0000 Encounter for general adult medical examination without abnormal findings: Secondary | ICD-10-CM

## 2021-06-20 NOTE — Progress Notes (Signed)
I connected with [name] today by telephone and verified that I am speaking with the correct person using two identifiers. Location patient: home Location provider: work Persons participating in the virtual visit: [name], [name].   I discussed the limitations, risks, security and privacy concerns of performing an evaluation and management service by telephone and the availability of in person appointments. I also discussed with the patient that there may be a patient responsible charge related to this service. The patient expressed understanding and verbally consented to this telephonic visit.    Interactive audio and video telecommunications were attempted between this provider and patient, however failed, due to patient having technical difficulties OR patient did not have access to video capability.  We continued and completed visit with audio only.     Vital signs may be patient reported or missing.  Subjective:   Jesse Norton is a 75 y.o. male who presents for Medicare Annual/Subsequent preventive examination.  Review of Systems     Cardiac Risk Factors include: advanced age (>46men, >12 women);hypertension;male gender;obesity (BMI >30kg/m2)     Objective:    Today's Vitals   06/20/21 0857  Weight: 250 lb (113.4 kg)  Height: 5\' 11"  (1.803 m)   Body mass index is 34.87 kg/m.  Advanced Directives 06/20/2021 01/21/2021 06/11/2020 06/04/2019 02/13/2019 02/04/2019 02/03/2019  Does Patient Have a Medical Advance Directive? Yes Yes Yes Yes Yes Yes Yes  Type of Paramedic of [place];Living will - [place];Living will Living will;Healthcare Power of [place];Living will [place];Living will [place];Living will  Does patient want to make changes to medical advance directive? - No - Patient declined - - No - Patient declined No - Patient declined -   Copy of [place] in Chart? No - copy requested - No - copy requested No - copy requested No - copy requested No - copy requested No - copy requested  Would patient like information on creating a medical advance directive? - - - - - - -    Current Medications (verified) Outpatient Encounter Medications as of 06/20/2021  Medication Sig   allopurinol (ZYLOPRIM) 300 MG tablet Take 1 tablet (300 mg total) by mouth daily.   amLODipine (NORVASC) 10 MG tablet Take 1 tablet (10 mg total) by mouth daily.   atorvastatin (LIPITOR) 40 MG tablet Take 1 tablet (40 mg total) by mouth daily.   clopidogrel (PLAVIX) 75 MG tablet Take 1 tablet (75 mg total) by mouth daily with breakfast.   ELDERBERRY PO Take 1 tablet by mouth 3 (three) times a week.   LORazepam (ATIVAN) 1 MG tablet Take 0.5-1 tablets (0.5-1 mg total) by mouth daily as needed for anxiety.   losartan (COZAAR) 100 MG tablet Take 1 tablet (100 mg total) by mouth daily.   metoprolol tartrate (LOPRESSOR) 25 MG tablet Take 1 tablet (25 mg total) by mouth 2 (two) times daily.   Multiple Vitamin (MULTIVITAMIN) tablet Take 1 tablet by mouth daily.   Multiple Vitamins-Minerals (EMERGEN-C IMMUNE PO) Take 1 tablet by mouth every other day.    nitroGLYCERIN (NITROSTAT) 0.4 MG SL tablet Place 1 tablet (0.4 mg total) under the tongue every 5 (five) minutes as needed for chest pain. Maximum of 3 doses.   Omega 3 1200 MG CAPS Take by mouth.   pantoprazole (PROTONIX) 20 MG tablet Take 1 tablet (20 mg total) by mouth 2 (two) times daily.  sertraline (ZOLOFT) 50 MG tablet Take 1 tablet (50 mg total) by mouth daily.   furosemide (LASIX) 20 MG tablet Take 1 tablet (20 mg total) by mouth daily.   No facility-administered encounter medications on file as of 06/20/2021.    Allergies (verified) Patient has no known allergies.   History: Past Medical History:  Diagnosis Date   (HFpEF) heart failure with preserved ejection fraction ([place])    a.  01/2019 Cath: EF 55-60% w/ elevated LVEDP.   Anxiety    Barrett's esophagus    CAD (coronary artery disease)    a. 10/2018 ETT: 74mm horizontal inflat ST depression, freq PVC's->Intermediate; b. 12/2018 Cardiac CTA: LM nl, LAD FFRct 0.6 in mLAD, LCX FFRct 0.99p, 0.70m, 0.77d, RCA no signif dzs, FFRct 0.89d->Rec cath; c. 01/2019 PCI: LM nl, LAD 40p, 65m, D2 70, LCX 70p (2.75x15 Resolute Onyx DES), RCA min irregs. EF 55-65%. EDP 25-21mmHg; d. 01/2019 PCI: LAD 80-90 (atherectomy & 3x15 Resolute Onyx DES).   Colon polyps    Duodenal adenoma    Gastritis    GERD (gastroesophageal reflux disease)    Gout    H. pylori infection    H/O urethral stricture    Hemorrhoids    Hiatal hernia    Hypertension    Stomach ulcer    Past Surgical History:  Procedure Laterality Date   COLONOSCOPY     COLONOSCOPY WITH PROPOFOL N/A 08/20/2018   Procedure: COLONOSCOPY WITH PROPOFOL;  Surgeon: Lollie Sails, MD;  Location[facility] ENDOSCOPY;  Service: Endoscopy;  Laterality: N/A;   COLONOSCOPY WITH PROPOFOL N/A 12/19/2018   Procedure: COLONOSCOPY WITH PROPOFOL;  Surgeon: Lollie Sails, MD;  Location: [facility] ENDOSCOPY;  Service: Endoscopy;  Laterality: N/A;   CORONARY ATHERECTOMY N/A 02/03/2019   Procedure: CORONARY ATHERECTOMY;  Surgeon: Nelva Bush, MD;  Location: [place] CV LAB;  Service: Cardiovascular;  Laterality: N/A;   CORONARY STENT INTERVENTION N/A 01/21/2019   Procedure: CORONARY STENT INTERVENTION;  Surgeon: Nelva Bush, MD;  Location: [place] CV LAB;  Service: Cardiovascular;  Laterality: N/A;   CORONARY STENT INTERVENTION N/A 02/03/2019   Procedure: CORONARY STENT INTERVENTION;  Surgeon: Nelva Bush, MD;  Location: [place] CV LAB;  Service: Cardiovascular;  Laterality: N/A;   CYSTOSCOPY WITH URETHRAL DILATATION N/A 08/16/2018   Procedure: CYSTOSCOPY WITH URETHRAL DILATATION;  Surgeon: [name], MD;  Location: [facility] ORS;  Service: Urology;  Laterality: N/A;    ESOPHAGOGASTRODUODENOSCOPY N/A 01/29/2017   Procedure: ESOPHAGOGASTRODUODENOSCOPY (EGD);  Surgeon: Lollie Sails, MD;  Location: [facility]COPY;  Service: Endoscopy;  Laterality: N/A;   ESOPHAGOGASTRODUODENOSCOPY (EGD) WITH PROPOFOL N/A 07/10/2016   Procedure: ESOPHAGOGASTRODUODENOSCOPY (EGD) WITH PROPOFOL;  Surgeon: Lollie Sails, MD;  Location: [facility] ENDOSCOPY;  Service: Endoscopy;  Laterality: N/A;   ESOPHAGOGASTRODUODENOSCOPY (EGD) WITH PROPOFOL N/A 12/19/2018   Procedure: ESOPHAGOGASTRODUODENOSCOPY (EGD) WITH PROPOFOL;  Surgeon: Lollie Sails, MD;  Location: [facility] ENDOSCOPY;  Service: Endoscopy;  Laterality: N/A;   gastritis     h.pylori     INTRAVASCULAR ULTRASOUND/IVUS N/A 02/03/2019   Procedure: Intravascular Ultrasound/IVUS;  Surgeon: Nelva Bush, MD;  Location: [place] CV LAB;  Service: Cardiovascular;  Laterality: N/A;   left elbow repair Left    LEFT HEART CATH AND CORONARY ANGIOGRAPHY Left 01/21/2019   Procedure: LEFT HEART CATH AND CORONARY ANGIOGRAPHY;  Surgeon: Nelva Bush, MD;  Location: [place] CV LAB;  Service: Cardiovascular;  Laterality: Left;   SPLENECTOMY  1957   TONSILLECTOMY     Family History  Adopted: Yes  Problem Relation Age of Onset   Heart attack Mother 37   Pancreatic cancer Father        died in WWII - pt adopted   Hypertension Sister    Hyperlipidemia Sister    Hypertension Brother    Hyperlipidemia Brother    Prostate cancer Neg Hx    Kidney cancer Neg Hx    Bladder Cancer Neg Hx    Social History   Socioeconomic History   Marital status: Married    Spouse name: Not on file   Number of children: Not on file   Years of education: Not on file   Highest education level: Master's degree (e.g., MA, MS, MEng, MEd, MSW, MBA)  Occupational History   Occupation: retired   Tobacco Use   Smoking status: Never   Smokeless tobacco: Never  Vaping Use   Vaping Use: Never used  Substance and Sexual Activity   Alcohol use: Yes     Alcohol/week: 7.0 standard drinks    Types: 7 Standard drinks or equivalent per week    Comment: moderate   Drug use: No   Sexual activity: Yes  Other Topics Concern   Not on file  Social History Narrative   Rides bicycle with friends       No smoking.  Occasional alcohol.  Lives at home.  Retired Customer service manager.   Social Determinants of Health   Financial Resource Strain: Low Risk    Difficulty of Paying Living Expenses: Not hard at all  Food Insecurity: No Food Insecurity   Worried About Charity fundraiser in the Last Year: Never true   [place] in the Last Year: Never true  Transportation Needs: No Transportation Needs   Lack of Transportation (Medical): No   Lack of Transportation (Non-Medical): No  Physical Activity: Insufficiently Active   Days of Exercise per Week: 4 days   Minutes of Exercise per Session: 30 min  Stress: No Stress Concern Present   Feeling of Stress : Not at all  Social Connections: Not on file    Tobacco Counseling Counseling given: Not Answered   Clinical Intake:  Pre-visit preparation completed: Yes  Pain : No/denies pain     Nutritional Status: BMI > 30  Obese Nutritional Risks: Nausea/ vomitting/ diarrhea (a little diarrhea from medication) Diabetes: No  How often do you need to have someone help you when you read instructions, pamphlets, or other written materials from your doctor or pharmacy?: 1 - Never What is the last grade level you completed in school?: graduate school  Diabetic? no  Interpreter Needed?: No  Information entered by :: NAllen LPN   Activities of Daily Living In your present state of health, do you have any difficulty performing the following activities: 06/20/2021 09/14/2020  Hearing? N N  Vision? Y N  Comment some blurriness with driving with glasses on, no night driving -  Difficulty concentrating or making decisions? N N  Walking or climbing stairs? N N  Dressing or bathing? N N  Doing errands,  shopping? N N  Preparing Food and eating ? N -  Using the Toilet? N -  In the past six months, have you accidently leaked urine? N -  Do you have problems with loss of bowel control? N -  Managing your Medications? N -  Managing your Finances? N -  Housekeeping or managing your Housekeeping? N -  Some recent data might be hidden    Patient Care Team: Venita Lick,  NP as PCP - General (Nurse Practitioner) End, Harrell Gave, MD as PCP - Cardiology (Cardiology) Lollie Sails, MD (Inactive) as Consulting Physician (Gastroenterology) Minor, Dalbert Garnet, RN (Inactive) as [place] any recent [place] you may have received from other than [facility] providers in the past year (date may be approximate).     Assessment:   This is a routine wellness examination for [place].  Hearing/Vision screen No results found.  Dietary issues and exercise activities discussed: Current Exercise Habits: Home exercise routine, Type of exercise: walking (bike riding), Time (Minutes): 30, Frequency (Times/Week): 4, Weekly Exercise (Minutes/Week): 120   Goals Addressed             This Visit's Progress    Patient Stated       06/20/2021, wants to lose 20 pounds by Christmas        Depression Screen PHQ 2/9 Scores 06/20/2021 03/15/2021 09/14/2020 06/21/2020 06/11/2020 02/17/2020 09/24/2019  PHQ - 2 Score 0 0 0 0 0 0 0  PHQ- 9 Score - 0 0 2 0 2 0    Fall Risk Fall Risk  06/20/2021 09/14/2020 06/11/2020 06/04/2019 02/13/2019  Falls in the past year? 1 1 1  0 0  Comment fell out of bed - tripped over dog cage - -  Number falls in past yr: 0 1 0 - -  Injury with Fall? 0 0 0 - 0  Risk for fall due to : Medication side effect No Fall Risks Medication side effect;History of fall(s);Impaired balance/gait - -  Follow up Falls evaluation completed;Education provided;Falls prevention discussed Falls evaluation completed Falls evaluation completed;Education provided;Falls  prevention discussed - -    FALL RISK PREVENTION PERTAINING TO THE HOME:  Any stairs in or around the home? Yes  If so, are there any without handrails? No  Home free of loose throw rugs in walkways, pet beds, electrical cords, etc? Yes  Adequate lighting in your home to reduce risk of falls? Yes   ASSISTIVE DEVICES UTILIZED TO PREVENT FALLS:  Life alert? No  Use of a cane, walker or w/c? No  Grab bars in the bathroom? Yes  Shower chair or bench in shower? Yes  Elevated toilet seat or a handicapped toilet? Yes   TIMED UP AND GO:  Was the test performed? No .      Cognitive Function:     6CIT Screen 06/20/2021 06/11/2020 08/19/2019 06/04/2019 05/23/2018  What Year? 0 points 0 points 0 points 0 points 0 points  What month? 0 points 0 points 0 points 0 points 0 points  What time? 0 points 0 points 0 points 0 points 0 points  Count back from 20 0 points 0 points 0 points 0 points 0 points  Months in reverse 0 points 0 points 0 points 0 points 0 points  Repeat phrase 0 points 0 points 0 points 0 points 0 points  Total Score 0 0 0 0 0    Immunizations Immunization History  Administered Date(s) Administered   Fluad Quad(high Dose 65+) 10/21/2019, 09/14/2020   Influenza, High Dose Seasonal PF 11/29/2016, 10/11/2017, 10/28/2018   Influenza,inj,Quad PF,6+ Mos 11/22/2015   Influenza-Unspecified 11/10/2014   PFIZER(Purple Top)SARS-COV-2 Vaccination 01/13/2020, 02/03/2020, 09/10/2020   Pneumococcal Conjugate-13 05/12/2014   Pneumococcal Polysaccharide-23 04/11/2010   Td 05/12/2014   Zoster, Live 10/08/2008    TDAP status: Up to date  Flu Vaccine status: Up to date  Pneumococcal vaccine status: Up to date  Covid-19 vaccine status: Completed vaccines  Qualifies for Shingles Vaccine? Yes   Zostavax completed Yes   Shingrix Completed?: No.    Education has been provided regarding the importance of this vaccine. Patient has been advised to call insurance company to determine out  of pocket expense if they have not yet received this vaccine. Advised may also receive vaccine at local pharmacy or Health Dept. Verbalized acceptance and understanding.  Screening Tests Health Maintenance  Topic Date Due   Zoster Vaccines- Shingrix (1 of 2) Never done   COVID-19 Vaccine (4 - Booster for Pfizer series) 12/11/2020   INFLUENZA VACCINE  07/11/2021   COLONOSCOPY (Pts 45-60yrs Insurance coverage will need to be confirmed)  12/19/2021   TETANUS/TDAP  05/12/2024   Hepatitis C Screening  Completed   PNA vac Low Risk Adult  Completed   HPV VACCINES  Aged Out    Health Maintenance  Health Maintenance Due  Topic Date Due   Zoster Vaccines- Shingrix (1 of 2) Never done   COVID-19 Vaccine (4 - Booster for Pfizer series) 12/11/2020    Colorectal cancer screening: Type of screening: Colonoscopy. Completed 12/19/2018. Repeat every 3 years  Lung Cancer Screening: (Low Dose CT Chest recommended if Age 51-80 years, 30 pack-year currently smoking OR have quit w/in 15years.) does not qualify.   Lung Cancer Screening Referral: no  Additional Screening:  Hepatitis C Screening: does qualify; Completed 10/30/2018  Vision Screening: Recommended annual ophthalmology exams for early detection of glaucoma and other disorders of the eye. Is the patient up to date with their annual eye exam?  Yes  Who is the provider or what is the name of the office in which the patient attends annual eye exams? [facility] If pt is not established with a provider, would they like to be referred to a provider to establish care? No .   Dental Screening: Recommended annual dental exams for proper oral hygiene  Community Resource Referral / Chronic Care Management: CRR required this visit?  No   CCM required this visit?  No      Plan:     I have personally reviewed and noted the following in the patient's chart:   Medical and social history Use of alcohol, tobacco or illicit drugs  Current  medications and supplements including opioid prescriptions. Patient is not currently taking opioid prescriptions. Functional ability and status Nutritional status Physical activity Advanced directives List of other physicians Hospitalizations, surgeries, and ER visits in previous 12 months Vitals Screenings to include cognitive, depression, and falls Referrals and appointments  In addition, I have reviewed and discussed with patient certain preventive protocols, quality metrics, and best practice recommendations. A written personalized care plan for preventive services as well as general preventive health recommendations were provided to patient.     Kellie Simmering, LPN   [id number]   Nurse Notes:

## 2021-06-20 NOTE — Patient Instructions (Signed)
Jesse Norton , Thank you for taking time to come for your Medicare Wellness Visit. I appreciate your ongoing commitment to your health goals. Please review the following plan we discussed and let me know if I can assist you in the future.   Screening recommendations/referrals: Colonoscopy: completed 12/19/2018, due 12/19/2021 Recommended yearly ophthalmology/optometry visit for glaucoma screening and checkup Recommended yearly dental visit for hygiene and checkup  Vaccinations: Influenza vaccine: completed 09/14/2020, due 07/11/2021 Pneumococcal vaccine: completed 05/12/2014 Tdap vaccine: completed 05/12/2014, due 05/12/2024 Shingles vaccine: discussed   Covid-19:  09/10/2020, 02/03/2020, 01/13/2020  Advanced directives: Please bring a copy of your POA (Power of Attorney) and/or Living Will to your next appointment.   Conditions/risks identified: none  Next appointment: Follow up in one year for your annual wellness visit.   Preventive Care 76 Years and Older, Male Preventive care refers to lifestyle choices and visits with your health care provider that can promote health and wellness. What does preventive care include? A yearly physical exam. This is also called an annual well check. Dental exams once or twice a year. Routine eye exams. Ask your health care provider how often you should have your eyes checked. Personal lifestyle choices, including: Daily care of your teeth and gums. Regular physical activity. Eating a healthy diet. Avoiding tobacco and drug use. Limiting alcohol use. Practicing safe sex. Taking low doses of aspirin every day. Taking vitamin and mineral supplements as recommended by your health care provider. What happens during an annual well check? The services and screenings done by your health care provider during your annual well check will depend on your age, overall health, lifestyle risk factors, and family history of disease. Counseling  Your health care provider may  ask you questions about your: Alcohol use. Tobacco use. Drug use. Emotional well-being. Home and relationship well-being. Sexual activity. Eating habits. History of falls. Memory and ability to understand (cognition). Work and work Statistician. Screening  You may have the following tests or measurements: Height, weight, and BMI. Blood pressure. Lipid and cholesterol levels. These may be checked every 5 years, or more frequently if you are over 60 years old. Skin check. Lung cancer screening. You may have this screening every year starting at age 39 if you have a 30-pack-year history of smoking and currently smoke or have quit within the past 15 years. Fecal occult blood test (FOBT) of the stool. You may have this test every year starting at age 16. Flexible sigmoidoscopy or colonoscopy. You may have a sigmoidoscopy every 5 years or a colonoscopy every 10 years starting at age 18. Prostate cancer screening. Recommendations will vary depending on your family history and other risks. Hepatitis C blood test. Hepatitis B blood test. Sexually transmitted disease (STD) testing. Diabetes screening. This is done by checking your blood sugar (glucose) after you have not eaten for a while (fasting). You may have this done every 1-3 years. Abdominal aortic aneurysm (AAA) screening. You may need this if you are a current or former smoker. Osteoporosis. You may be screened starting at age 77 if you are at high risk. Talk with your health care provider about your test results, treatment options, and if necessary, the need for more tests. Vaccines  Your health care provider may recommend certain vaccines, such as: Influenza vaccine. This is recommended every year. Tetanus, diphtheria, and acellular pertussis (Tdap, Td) vaccine. You may need a Td booster every 10 years. Zoster vaccine. You may need this after age 89. Pneumococcal 13-valent conjugate (PCV13) vaccine.  One dose is recommended after age  54. Pneumococcal polysaccharide (PPSV23) vaccine. One dose is recommended after age 80. Talk to your health care provider about which screenings and vaccines you need and how often you need them. This information is not intended to replace advice given to you by your health care provider. Make sure you discuss any questions you have with your health care provider. Document Released: 12/24/2015 Document Revised: 08/16/2016 Document Reviewed: 09/28/2015 Elsevier Interactive Patient Education  2017 [place] Prevention in the Home Falls can cause injuries. They can happen to people of all ages. There are many things you can do to make your home safe and to help prevent falls. What can I do on the outside of my home? Regularly fix the edges of walkways and driveways and fix any cracks. Remove anything that might make you trip as you walk through a door, such as a raised step or threshold. Trim any bushes or trees on the path to your home. Use bright outdoor lighting. Clear any walking paths of anything that might make someone trip, such as rocks or tools. Regularly check to see if handrails are loose or broken. Make sure that both sides of any steps have handrails. Any raised decks and porches should have guardrails on the edges. Have any leaves, snow, or ice cleared regularly. Use sand or salt on walking paths during winter. Clean up any spills in your garage right away. This includes oil or grease spills. What can I do in the bathroom? Use night lights. Install grab bars by the toilet and in the tub and shower. Do not use towel bars as grab bars. Use non-skid mats or decals in the tub or shower. If you need to sit down in the shower, use a plastic, non-slip stool. Keep the floor dry. Clean up any water that spills on the floor as soon as it happens. Remove soap buildup in the tub or shower regularly. Attach bath mats securely with double-sided non-slip rug tape. Do not have throw  rugs and other things on the floor that can make you trip. What can I do in the bedroom? Use night lights. Make sure that you have a light by your bed that is easy to reach. Do not use any sheets or blankets that are too big for your bed. They should not hang down onto the floor. Have a firm chair that has side arms. You can use this for support while you get dressed. Do not have throw rugs and other things on the floor that can make you trip. What can I do in the kitchen? Clean up any spills right away. Avoid walking on wet floors. Keep items that you use a lot in easy-to-reach places. If you need to reach something above you, use a strong step stool that has a grab bar. Keep electrical cords out of the way. Do not use floor polish or wax that makes floors slippery. If you must use wax, use non-skid floor wax. Do not have throw rugs and other things on the floor that can make you trip. What can I do with my stairs? Do not leave any items on the stairs. Make sure that there are handrails on both sides of the stairs and use them. Fix handrails that are broken or loose. Make sure that handrails are as long as the stairways. Check any carpeting to make sure that it is firmly attached to the stairs. Fix any carpet that is loose or  worn. Avoid having throw rugs at the top or bottom of the stairs. If you do have throw rugs, attach them to the floor with carpet tape. Make sure that you have a light switch at the top of the stairs and the bottom of the stairs. If you do not have them, ask someone to add them for you. What else can I do to help prevent falls? Wear shoes that: Do not have high heels. Have rubber bottoms. Are comfortable and fit you well. Are closed at the toe. Do not wear sandals. If you use a stepladder: Make sure that it is fully opened. Do not climb a closed stepladder. Make sure that both sides of the stepladder are locked into place. Ask someone to hold it for you, if  possible. Clearly mark and make sure that you can see: Any grab bars or handrails. First and last steps. Where the edge of each step is. Use tools that help you move around (mobility aids) if they are needed. These include: Canes. Walkers. Scooters. Crutches. Turn on the lights when you go into a dark area. Replace any light bulbs as soon as they burn out. Set up your furniture so you have a clear path. Avoid moving your furniture around. If any of your floors are uneven, fix them. If there are any pets around you, be aware of where they are. Review your medicines with your doctor. Some medicines can make you feel dizzy. This can increase your chance of falling. Ask your doctor what other things that you can do to help prevent falls. This information is not intended to replace advice given to you by your health care provider. Make sure you discuss any questions you have with your health care provider. Document Released: 09/23/2009 Document Revised: 05/04/2016 Document Reviewed: 01/01/2015 Elsevier Interactive Patient Education  2017 [employer].

## 2021-07-22 ENCOUNTER — Encounter: Payer: Self-pay | Admitting: Internal Medicine

## 2021-07-22 ENCOUNTER — Inpatient Hospital Stay: Payer: Medicare Other | Attending: Internal Medicine

## 2021-07-22 ENCOUNTER — Inpatient Hospital Stay (HOSPITAL_BASED_OUTPATIENT_CLINIC_OR_DEPARTMENT_OTHER): Payer: Medicare Other | Admitting: Internal Medicine

## 2021-07-22 ENCOUNTER — Other Ambulatory Visit: Payer: Self-pay

## 2021-07-22 DIAGNOSIS — E669 Obesity, unspecified: Secondary | ICD-10-CM | POA: Diagnosis not present

## 2021-07-22 DIAGNOSIS — Z8579 Personal history of other malignant neoplasms of lymphoid, hematopoietic and related tissues: Secondary | ICD-10-CM | POA: Insufficient documentation

## 2021-07-22 DIAGNOSIS — C884 Extranodal marginal zone B-cell lymphoma of mucosa-associated lymphoid tissue [MALT-lymphoma]: Secondary | ICD-10-CM | POA: Diagnosis not present

## 2021-07-22 DIAGNOSIS — Z86010 Personal history of colonic polyps: Secondary | ICD-10-CM | POA: Diagnosis not present

## 2021-07-22 DIAGNOSIS — Z9081 Acquired absence of spleen: Secondary | ICD-10-CM | POA: Diagnosis not present

## 2021-07-22 DIAGNOSIS — I10 Essential (primary) hypertension: Secondary | ICD-10-CM | POA: Diagnosis not present

## 2021-07-22 DIAGNOSIS — Z800 Family history of malignant neoplasm of digestive organs: Secondary | ICD-10-CM | POA: Insufficient documentation

## 2021-07-22 LAB — COMPREHENSIVE METABOLIC PANEL
ALT: 34 U/L (ref 0–44)
AST: 57 U/L — ABNORMAL HIGH (ref 15–41)
Albumin: 4.4 g/dL (ref 3.5–5.0)
Alkaline Phosphatase: 68 U/L (ref 38–126)
Anion gap: 13 (ref 5–15)
BUN: 19 mg/dL (ref 8–23)
CO2: 26 mmol/L (ref 22–32)
Calcium: 9 mg/dL (ref 8.9–10.3)
Chloride: 99 mmol/L (ref 98–111)
Creatinine, Ser: 0.95 mg/dL (ref 0.61–1.24)
GFR, Estimated: >60 mL/min (ref >60)
Glucose, Bld: 139 mg/dL — ABNORMAL HIGH (ref 70–99)
Potassium: 3.5 mmol/L (ref 3.5–5.1)
Sodium: 138 mmol/L (ref 135–145)
Total Bilirubin: 1.3 mg/dL — ABNORMAL HIGH (ref 0.3–1.2)
Total Protein: 7.7 g/dL (ref 6.5–8.1)

## 2021-07-22 LAB — CBC WITH DIFFERENTIAL/PLATELET
Abs Immature Granulocytes: 0.04 10*3/uL (ref 0.00–0.07)
Basophils Absolute: 0.1 10*3/uL (ref 0.0–0.1)
Basophils Relative: 2 %
Eosinophils Absolute: 0.2 10*3/uL (ref 0.0–0.5)
Eosinophils Relative: 3 %
HCT: 40.3 % (ref 39.0–52.0)
Hemoglobin: 13.8 g/dL (ref 13.0–17.0)
Immature Granulocytes: 1 %
Lymphocytes Relative: 33 %
Lymphs Abs: 2.4 10*3/uL (ref 0.7–4.0)
MCH: 34.8 pg — ABNORMAL HIGH (ref 26.0–34.0)
MCHC: 34.2 g/dL (ref 30.0–36.0)
MCV: 101.8 fL — ABNORMAL HIGH (ref 80.0–100.0)
Monocytes Absolute: 0.8 10*3/uL (ref 0.1–1.0)
Monocytes Relative: 11 %
Neutro Abs: 3.6 10*3/uL (ref 1.7–7.7)
Neutrophils Relative %: 50 %
Platelets: 294 10*3/uL (ref 150–400)
RBC: 3.96 MIL/uL — ABNORMAL LOW (ref 4.22–5.81)
RDW: 12.8 % (ref 11.5–15.5)
WBC: 7.2 10*3/uL (ref 4.0–10.5)
nRBC: 0.6 % — ABNORMAL HIGH (ref 0.0–0.2)

## 2021-07-22 LAB — LACTATE DEHYDROGENASE: LDH: 166 U/L (ref 98–192)

## 2021-07-22 NOTE — Progress Notes (Signed)
[place] NOTE  Patient Care Team: Venita Lick, NP as PCP - General (Nurse Practitioner) End, Harrell Gave, MD as PCP - Cardiology (Cardiology) Lollie Sails, MD (Inactive) as Consulting Physician (Gastroenterology) Minor, Dalbert Garnet, RN (Inactive) as [place] Management  CHIEF COMPLAINTS/PURPOSE OF CONSULTATION:  Lymphoma  #  Oncology History Overview Note  # NOV 2019- MALT [II opinion at [facility] path]; s/p sigmoid colon polyp resection [Dr.Skulskie; incidental]; PET scan -NED  # Barrett's esophagus- 2018;#Colonic polyps surveillance/Dr. 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  male is here for follow-up of his MALT lymphoma of the colon/polyp status post resection.  Patient denies any new lumps or bumps.  Denies any nausea vomiting.  No night sweats.  Patient has lost some weight since last visit.  He has been trying to be physically active.  Review of Systems  Constitutional:  Positive for malaise/fatigue. Negative for chills, diaphoresis, fever and weight loss.  HENT:  Negative for nosebleeds and sore throat.   Eyes:  Negative for double vision.  Respiratory:  Negative for cough, hemoptysis, sputum production, shortness of breath and wheezing.   Cardiovascular:  Negative for chest pain, palpitations, orthopnea and leg swelling.  Gastrointestinal:  Negative for abdominal pain, blood in stool, constipation, diarrhea, heartburn, melena, nausea and vomiting.   Genitourinary:  Negative for dysuria, frequency and urgency.  Musculoskeletal:  Negative for back pain and joint pain.  Skin: Negative.  Negative for itching and rash.  Neurological:  Negative for dizziness, tingling, focal weakness, weakness and headaches.  Endo/Heme/Allergies:  Does not bruise/bleed easily.  Psychiatric/Behavioral:  Negative for depression. The patient is not nervous/anxious and does not have insomnia.     MEDICAL HISTORY:  Past Medical History:  Diagnosis Date  . (HFpEF) heart failure with preserved ejection fraction ([place])    a. 01/2019 Cath: EF 55-60% w/ elevated LVEDP.  Marland Kitchen Anxiety   . Barrett's esophagus   . CAD (coronary artery disease)    a. 10/2018 ETT: 56m horizontal inflat ST depression, freq PVC's->Intermediate; b. 12/2018 Cardiac CTA: LM nl, LAD FFRct 0.6 in mLAD, LCX FFRct 0.99p, 0.833m0.77d, RCA no signif dzs, FFRct 0.89d->Rec cath; c. 01/2019 PCI: LM nl, LAD 40p, 8579m2 70, LCX 70p (2.75x15 Resolute Onyx DES), RCA min irregs. EF 55-65%. EDP 25-23m41m d. 01/2019 PCI: LAD 80-90 (atherectomy & 3x15 Resolute Onyx DES).  . Colon polyps   . Duodenal adenoma   . Gastritis   . GERD (gastroesophageal reflux disease)   . Gout   . H. pylori infection   . H/O urethral stricture   . Hemorrhoids   . Hiatal hernia   . Hypertension   . Stomach ulcer     SURGICAL HISTORY: Past Surgical History:  Procedure Laterality Date  . COLONOSCOPY    . COLONOSCOPY WITH PROPOFOL N/A 08/20/2018   Procedure: COLONOSCOPY WITH PROPOFOL;  Surgeon: SkulLollie Sails;  [facility];  Service: Endoscopy;  Laterality: N/A;  . COLONOSCOPY  WITH PROPOFOL N/A 12/19/2018   Procedure: COLONOSCOPY WITH PROPOFOL;  Surgeon: Lollie Sails, MD;  Location[facility] ENDOSCOPY;  Service: Endoscopy;  Laterality: N/A;  . CORONARY ATHERECTOMY N/A 02/03/2019   Procedure: CORONARY ATHERECTOMY;  Surgeon: Nelva Bush, MD;  Location: [place] CV LAB;  Service: Cardiovascular;   Laterality: N/A;  . CORONARY STENT INTERVENTION N/A 01/21/2019   Procedure: CORONARY STENT INTERVENTION;  Surgeon: Nelva Bush, MD;  Location: [place] CV LAB;  Service: Cardiovascular;  Laterality: N/A;  . CORONARY STENT INTERVENTION N/A 02/03/2019   Procedure: CORONARY STENT INTERVENTION;  Surgeon: Nelva Bush, MD;  Location: [place] CV LAB;  Service: Cardiovascular;  Laterality: N/A;  . CYSTOSCOPY WITH URETHRAL DILATATION N/A 08/16/2018   Procedure: CYSTOSCOPY WITH URETHRAL DILATATION;  Surgeon: [name], MD;  Location: [facility] ORS;  Service: Urology;  Laterality: N/A;  . ESOPHAGOGASTRODUODENOSCOPY N/A 01/29/2017   Procedure: ESOPHAGOGASTRODUODENOSCOPY (EGD);  Surgeon: Lollie Sails, MD;  [facility];  Service: Endoscopy;  Laterality: N/A;  . ESOPHAGOGASTRODUODENOSCOPY (EGD) WITH PROPOFOL N/A 07/10/2016   Procedure: ESOPHAGOGASTRODUODENOSCOPY (EGD) WITH PROPOFOL;  Surgeon: Lollie Sails, MD;  Location: [facility] ENDOSCOPY;  Service: Endoscopy;  Laterality: N/A;  . ESOPHAGOGASTRODUODENOSCOPY (EGD) WITH PROPOFOL N/A 12/19/2018   Procedure: ESOPHAGOGASTRODUODENOSCOPY (EGD) WITH PROPOFOL;  Surgeon: Lollie Sails, MD;  Location[facility] ENDOSCOPY;  Service: Endoscopy;  Laterality: N/A;  . gastritis    . h.pylori    . INTRAVASCULAR ULTRASOUND/IVUS N/A 02/03/2019   Procedure: Intravascular Ultrasound/IVUS;  Surgeon: Nelva Bush, MD;  Location: [place] CV LAB;  Service: Cardiovascular;  Laterality: N/A;  . left elbow repair Left   . LEFT HEART CATH AND CORONARY ANGIOGRAPHY Left 01/21/2019   Procedure: LEFT HEART CATH AND CORONARY ANGIOGRAPHY;  Surgeon: Nelva Bush, MD;  Location: [place] CV LAB;  Service: Cardiovascular;  Laterality: Left;  . SPLENECTOMY  1957  . TONSILLECTOMY      SOCIAL HISTORY: Social History   Socioeconomic History  . Marital status: Married    Spouse name: Not on file  . Number of children: Not on file  . Years of  education: Not on file  . Highest education level: Master's degree (e.g., MA, MS, MEng, MEd, MSW, MBA)  Occupational History  . Occupation: retired   Tobacco Use  . Smoking status: Never  . Smokeless tobacco: Never  Vaping Use  . Vaping Use: Never used  Substance and Sexual Activity  . Alcohol use: Yes    Alcohol/week: 7.0 standard drinks    Types: 7 Standard drinks or equivalent per week    Comment: moderate  . Drug use: No  . Sexual activity: Yes  Other Topics Concern  . Not on file  Social History Narrative   Rides bicycle with friends       No smoking.  Occasional alcohol.  Lives at home.  Retired Customer service manager.   Social Determinants of Health   Financial Resource Strain: Low Risk   . Difficulty of Paying Living Expenses: Not hard at all  Food Insecurity: No Food Insecurity  . Worried About Charity fundraiser in the Last Year: Never true  . Ran Out of Food in the Last Year: Never true  Transportation Needs: No Transportation Needs  . Lack of Transportation (Medical): No  . Lack of Transportation (Non-Medical): No  Physical Activity: Insufficiently Active  . Days of Exercise per Week: 4 days  . Minutes of Exercise per Session: 30 min  Stress: No Stress Concern Present  . Feeling of  Stress : Not at all  Social Connections: Not on file  Intimate Partner Violence: Not on file    FAMILY HISTORY: Family History  Adopted: Yes  Problem Relation Age of Onset  . Heart attack Mother 17  . Pancreatic cancer Father        died in WWII - pt adopted  . Hypertension Sister   . Hyperlipidemia Sister   . Hypertension Brother   . Hyperlipidemia Brother   . Prostate cancer Neg Hx   . Kidney cancer Neg Hx   . Bladder Cancer Neg Hx     ALLERGIES:  has No Known Allergies.  MEDICATIONS:  Current Outpatient Medications  Medication Sig Dispense Refill  . allopurinol (ZYLOPRIM) 300 MG tablet Take 1 tablet (300 mg total) by mouth daily. 90 tablet 4  . amLODipine (NORVASC) 10 MG  tablet Take 1 tablet (10 mg total) by mouth daily. 90 tablet 4  . atorvastatin (LIPITOR) 40 MG tablet Take 1 tablet (40 mg total) by mouth daily. 90 tablet 4  . clopidogrel (PLAVIX) 75 MG tablet Take 1 tablet (75 mg total) by mouth daily with breakfast. 90 tablet 4  . ELDERBERRY PO Take 1 tablet by mouth 3 (three) times a week.    . furosemide (LASIX) 20 MG tablet Take 1 tablet (20 mg total) by mouth daily. 90 tablet 2  . LORazepam (ATIVAN) 1 MG tablet Take 0.5-1 tablets (0.5-1 mg total) by mouth daily as needed for anxiety. 30 tablet 1  . losartan (COZAAR) 100 MG tablet Take 1 tablet (100 mg total) by mouth daily. 90 tablet 4  . metoprolol tartrate (LOPRESSOR) 25 MG tablet Take 1 tablet (25 mg total) by mouth 2 (two) times daily. 180 tablet 3  . Multiple Vitamin (MULTIVITAMIN) tablet Take 1 tablet by mouth daily.    . Multiple Vitamins-Minerals (EMERGEN-C IMMUNE PO) Take 1 tablet by mouth every other day.     . nitroGLYCERIN (NITROSTAT) 0.4 MG SL tablet Place 1 tablet (0.4 mg total) under the tongue every 5 (five) minutes as needed for chest pain. Maximum of 3 doses. 25 tablet 3  . Omega 3 1200 MG CAPS Take by mouth.    . pantoprazole (PROTONIX) 20 MG tablet Take 1 tablet (20 mg total) by mouth 2 (two) times daily. 180 tablet 4  . sertraline (ZOLOFT) 50 MG tablet Take 1 tablet (50 mg total) by mouth daily. 90 tablet 4   No current facility-administered medications for this visit.      Marland Kitchen  PHYSICAL EXAMINATION: ECOG PERFORMANCE STATUS: 0 - Asymptomatic  Vitals:   07/22/21 1023  BP: (!) 151/94  Pulse: 82  Resp: 18  Temp: 97.8 F (36.6 C)  SpO2: 98%   Filed Weights   07/22/21 1023  Weight: 267 lb (121.1 kg)    Physical Exam HENT:     Head: Normocephalic and atraumatic.     Mouth/Throat:     Pharynx: No oropharyngeal exudate.  Eyes:     Pupils: Pupils are equal, round, and reactive to light.  Cardiovascular:     Rate and Rhythm: Normal rate and regular rhythm.  Pulmonary:      Effort: No respiratory distress.     Breath sounds: No wheezing.  Abdominal:     General: Bowel sounds are normal. There is no distension.     Palpations: Abdomen is soft. There is no mass.     Tenderness: no abdominal tenderness There is no guarding or rebound.  Musculoskeletal:  General: No tenderness. Normal range of motion.     Cervical back: Normal range of motion and neck supple.  Skin:    General: Skin is warm.  Neurological:     Mental Status: He is alert and oriented to person, place, and time.  Psychiatric:        Mood and Affect: Affect normal.     LABORATORY DATA:  I have reviewed the data as listed Lab Results  Component Value Date   WBC 7.2 07/22/2021   HGB 13.8 07/22/2021   HCT 40.3 07/22/2021   MCV 101.8 (H) 07/22/2021   PLT 294 07/22/2021   Recent Labs    09/14/20 1345 01/21/21 1025 07/22/21 1014  NA 139 139 138  K 4.1 4.5 3.5  CL 97 103 99  CO2 '26 25 26  '$ GLUCOSE 107* 121* 139*  BUN '15 20 19  '$ CREATININE 0.90 0.85 0.95  CALCIUM 9.8 9.5 9.0  GFRNONAA 83 >60 >60  GFRAA 96  --   --   PROT 7.3 7.6 7.7  ALBUMIN 4.7 4.4 4.4  AST 29 43* 57*  ALT 28 35 34  ALKPHOS 85 64 68  BILITOT 0.8 0.9 1.3*    RADIOGRAPHIC STUDIES: I have personally reviewed the radiological images as listed and agreed with the findings in the report. No results found.  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  Stable.  Continue monitoring without imaging.  # Multiple colon polyps/Barrett's surveillance Dr. Gustavo Lah- last EGD/Colo- Jan 2020.  Reviewed the EGD/pathology-no evidence of progression-awaiting repeat endoscopies Jan 2023.  # [id number] ? [at home 130-80s]; question situational.  Monitor closely.  # Obesity  Discussed importance of healthy weight/and  weight loss.  Strongly recommend eating more green leafy vegetables and cutting down processed food/ carbohydrates.  Instead increasing whole grains / protein in the diet.  Multiple studies have shown that optimal weight would help improve cardiovascular risk; also shown to cut on the risk of malignancies- esophagus/colon cancer, breast cancer ovarian/uterine cancer in women and also prostate cancer in men.  Also recommend increase physical activity.  Patient very motivated.  *ptpref # DISPOSITION: # follow up in 12 months MD/labs- cbc/cmp/ldh-Dr.B cbc/cmp/ldh-Dr.B  All questions were answered. The patient knows to call the clinic with any problems, questions or concerns.    Cammie Sickle, MD 07/22/2021 2:11 PM

## 2021-07-22 NOTE — Assessment & Plan Note (Addendum)
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  Stable.  Continue monitoring without imaging.  # Multiple colon polyps/Barrett's surveillance Dr. Gustavo Lah- last EGD/Colo- Jan 2020.  Reviewed the EGD/pathology-no evidence of progression-awaiting repeat endoscopies Jan 2023.  # [id number] ? [at home 130-80s]; question situational.  Monitor closely.  # Obesity  Discussed importance of healthy weight/and weight loss.  Strongly recommend eating more green leafy vegetables and cutting down processed food/ carbohydrates.  Instead increasing whole grains / protein in the diet.  Multiple studies have shown that optimal weight would help improve cardiovascular risk; also shown to cut on the risk of malignancies- esophagus/colon cancer, breast cancer ovarian/uterine cancer in women and also prostate cancer in men.  Also recommend increase physical activity.  Patient very motivated.  *ptpref # DISPOSITION: # follow up in 12 months MD/labs- cbc/cmp/ldh-Dr.B cbc/cmp/ldh-Dr.B

## 2021-08-18 ENCOUNTER — Other Ambulatory Visit: Payer: Self-pay | Admitting: Nurse Practitioner

## 2021-08-18 ENCOUNTER — Other Ambulatory Visit: Payer: Self-pay | Admitting: Unknown Physician Specialty

## 2021-08-18 NOTE — Telephone Encounter (Signed)
Patient last seen 05/10/21 and has appointment 09/15/21

## 2021-08-18 NOTE — Telephone Encounter (Signed)
Requested medication (s) are due for refill today - yes- expired Rx  Requested medication (s) are on the active medication list -yes  Future visit scheduled -yes  Last refill: 07/23/20 #90 4RF  Notes to clinic: Request RF: expired Rx- passes protocol  Requested Prescriptions  Pending Prescriptions Disp Refills   losartan (COZAAR) 100 MG tablet [Pharmacy Med Name: LOSARTAN '100MG'$  TABLETS] 90 tablet 4    Sig: TAKE 1 TABLET(100 MG) BY MOUTH DAILY     Cardiovascular:  Angiotensin Receptor Blockers Failed - 08/18/2021 11:46 AM      Failed - Last BP in normal range    BP Readings from Last 1 Encounters:  07/22/21 (!) 151/94          Passed - Cr in normal range and within 180 days    Creatinine  Date Value Ref Range Status  03/15/2021 128.5 20.0 - 300.0 mg/dL Final  01/06/2014 1.13 0.60 - 1.30 mg/dL Final   Creatinine, Ser  Date Value Ref Range Status  07/22/2021 0.95 0.61 - 1.24 mg/dL Final          Passed - K in normal range and within 180 days    Potassium  Date Value Ref Range Status  07/22/2021 3.5 3.5 - 5.1 mmol/L Final  01/06/2014 4.0 3.5 - 5.1 mmol/L Final          Passed - Patient is not pregnant      Passed - Valid encounter within last 6 months    Recent Outpatient Visits           3 months ago Morbid obesity ([place])   [place], [place] T, NP   4 months ago Cough   [place], [place] T, NP   5 months ago 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) ([place])   [place] Cannady, Jolene T, NP   11 months ago Chronic heart failure with preserved ejection fraction (HFpEF) ([place])   [place], Jolene T, NP   1 year ago Depression, recurrent ([place])   [place], Barbaraann Faster, NP       Future Appointments             In 1 week End, Harrell Gave, MD [place], [facility]   In 4 weeks Shindler, Wilsey T,  NP [facility], [facility]   In 10 months  [facility]               Requested Prescriptions  Pending Prescriptions Disp Refills   losartan (COZAAR) 100 MG tablet [Pharmacy Med Name: LOSARTAN '100MG'$  TABLETS] 90 tablet 4    Sig: TAKE 1 TABLET(100 MG) BY MOUTH DAILY     Cardiovascular:  Angiotensin Receptor Blockers Failed - 08/18/2021 11:46 AM      Failed - Last BP in normal range    BP Readings from Last 1 Encounters:  07/22/21 (!) 151/94          Passed - Cr in normal range and within 180 days    Creatinine  Date Value Ref Range Status  03/15/2021 128.5 20.0 - 300.0 mg/dL Final  01/06/2014 1.13 0.60 - 1.30 mg/dL Final   Creatinine, Ser  Date Value Ref Range Status  07/22/2021 0.95 0.61 - 1.24 mg/dL Final          Passed - K in normal range and within 180 days    Potassium  Date Value Ref Range Status  07/22/2021  3.5 3.5 - 5.1 mmol/L Final  01/06/2014 4.0 3.5 - 5.1 mmol/L Final          Passed - Patient is not pregnant      Passed - Valid encounter within last 6 months    Recent Outpatient Visits           3 months ago Morbid obesity ([place])   [place], Pierre [place] T, NP   4 months ago Cough   [place], Latta T, NP   5 months ago 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

## 2021-08-18 NOTE — Telephone Encounter (Signed)
Requested medication (s) are due for refill today - yes-expired Rx  Requested medication (s) are on the active medication list -yes  Future visit scheduled -yes  Last refill: 07/14/20 3180 3RF  Notes to clinic: Request Rf: expired Rx- passes protocol  Requested Prescriptions  Pending Prescriptions Disp Refills   metoprolol tartrate (LOPRESSOR) 25 MG tablet [Pharmacy Med Name: METOPROLOL TARTRATE '25MG'$  TABLETS] 180 tablet 3    Sig: TAKE 1 TABLET(25 MG) BY MOUTH TWICE DAILY     Cardiovascular:  Beta Blockers Failed - 08/18/2021 11:56 AM      Failed - Last BP in normal range    BP Readings from Last 1 Encounters:  07/22/21 (!) 151/94          Passed - Last Heart Rate in normal range    Pulse Readings from Last 1 Encounters:  07/22/21 82          Passed - Valid encounter within last 6 months    Recent Outpatient Visits           3 months ago Morbid obesity ([place])   [place], Minden T, NP   4 months ago Cough   [place], Hodgen T, NP   5 months ago 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) ([place])   [place] Cannady, Jolene T, NP   11 months ago Chronic heart failure with preserved ejection fraction (HFpEF) ([place])   [place] Cannady, Henrine Screws T, NP   1 year ago Depression, recurrent ([place])   [place], Barbaraann Faster, NP       Future Appointments             In 1 week End, Harrell Gave, MD [place], [place]   In 4 weeks [place], Chester T, NP [facility]   In 10 months  [facility]               Requested Prescriptions  Pending Prescriptions Disp Refills   metoprolol tartrate (LOPRESSOR) 25 MG tablet [Pharmacy Med Name: METOPROLOL TARTRATE '25MG'$  TABLETS] 180 tablet 3    Sig: TAKE 1 TABLET(25 MG) BY MOUTH TWICE DAILY     Cardiovascular:  Beta Blockers Failed - 08/18/2021  11:56 AM      Failed - Last BP in normal range    BP Readings from Last 1 Encounters:  07/22/21 (!) 151/94          Passed - Last Heart Rate in normal range    Pulse Readings from Last 1 Encounters:  07/22/21 82          Passed - Valid encounter within last 6 months    Recent Outpatient Visits           3 months ago Morbid obesity ([place])   [place], Lead T, NP   4 months ago Cough   [place], White Bird T, NP   5 months ago 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

## 2021-08-31 ENCOUNTER — Encounter: Payer: Self-pay | Admitting: Internal Medicine

## 2021-08-31 ENCOUNTER — Ambulatory Visit (INDEPENDENT_AMBULATORY_CARE_PROVIDER_SITE_OTHER): Payer: Medicare Other | Admitting: Internal Medicine

## 2021-08-31 ENCOUNTER — Other Ambulatory Visit: Payer: Self-pay

## 2021-08-31 VITALS — BP 164/84 | HR 73 | Ht 71.0 in | Wt 275.0 lb

## 2021-08-31 DIAGNOSIS — E785 Hyperlipidemia, unspecified: Secondary | ICD-10-CM

## 2021-08-31 DIAGNOSIS — I5032 Chronic diastolic (congestive) heart failure: Secondary | ICD-10-CM

## 2021-08-31 DIAGNOSIS — I2510 Atherosclerotic heart disease of native coronary artery without angina pectoris: Secondary | ICD-10-CM

## 2021-08-31 DIAGNOSIS — I25118 Atherosclerotic heart disease of native coronary artery with other forms of angina pectoris: Secondary | ICD-10-CM

## 2021-08-31 DIAGNOSIS — I10 Essential (primary) hypertension: Secondary | ICD-10-CM

## 2021-08-31 NOTE — Patient Instructions (Signed)
Medication Instructions:   Your physician recommends that you continue on your current medications as directed. Please refer to the Current Medication list given to you today.  *If you need a refill on your cardiac medications before your next appointment, please call your pharmacy*   Lab Work:  None ordered  Testing/Procedures:  None ordered   Follow-Up: At [facility], you and your health needs are our priority.  As part of our continuing mission to provide you with exceptional heart care, we have created designated Provider Care Teams.  These Care Teams include your primary Cardiologist (physician) and Advanced Practice Providers (APPs -  Physician Assistants and Nurse Practitioners) who all work together to provide you with the care you need, when you need it.  We recommend signing up for the patient portal called "MyChart".  Sign up information is provided on this After Visit Summary.  MyChart is used to connect with patients for Virtual Visits (Telemedicine).  Patients are able to view lab/test results, encounter notes, upcoming appointments, etc.  Non-urgent messages can be sent to your provider as well.   To learn more about what you can do with MyChart, go to [email or web address].    Your next appointment:   3 month(s)  The format for your next appointment:   In Person  Provider:   You may see Nelva Bush, MD or one of the following Advanced Practice Providers on your designated Care Team:   Murray Hodgkins, NP Christell Faith, PA-C Marrianne Mood, PA-C [place] [address] Runner, [place]

## 2021-08-31 NOTE — Progress Notes (Signed)
Follow-up Outpatient Visit Date: 08/31/2021  Primary Care Provider: Venita Lick, NP [place] [id number]  Chief Complaint: Follow-up coronary artery disease and HFpEF  HPI:  [name] is a 75 y.o. male with history of coronary artery disease status post PCI to LCx and LAD, HFpEF, hypertension, Barrett's esophagus, MALT-lymphoma of colon, gout, BPH, and obesity, who presents for follow-up of coronary artery disease and HFpEF.  I last saw him in March, at which time [name] was feeling well with stable exertional dyspnea.  He noted some balance issues over the preceding few months.  We did not pursue any additional testing.  Aspirin was discontinued in favor of single antiplatelet therapy with clopidogrel.  Today, [name] reports that he has been feeling quite well.  He is trying to lose weight through diet and exercise.  He denies chest pain, palpitations, lightheadedness, and edema.  He reports "breathing heavy" at times with strenuous exercise though this does not seem out of proportion to his level of activity.  He otherwise denies shortness of breath.  Home blood pressures are typically in the 170Y to 174B systolic.  --------------------------------------------------------------------------------------------------  Cardiovascular History & Procedures: Cardiovascular Problems: Coronary artery disease HFpEF   Risk Factors: Known CAD, hypertension, male gender, obesity, and age greater than 70   Cath/PCI: PCI (02/03/2019): Severe, heavily calcified mid LAD stenosis (80-90%), unchanged from diagnostic catheterization earlier this month. Widely patent LCx stent. Normal left ventricular filling pressure. Successful orbital atherectomy and IVUS-guided PCI to the mid LAD using a Resolute Onyx 3.0 x 15 mm drug-eluting stent (postdilated to 3.6 mm) with 0% residual stenosis and TIMI-3 flow.   LHC/PCI (01/21/2019): Significant 2-vessel coronary artery disease,  including 80-90% mid LAD stenosis with heavy calcification, as well as 70% proximal LCx stenosis. Normal left ventricular systolic function. Moderately elevated left ventricular filling pressure. Successful PCI to proximal LCx using Resolute Onyx 2.75 x 15 mm drug-eluting stent (postdilated to 3.1 mm proximally) with 0% residual stenosis and TIMI-3 flow.   CV Surgery: None   EP Procedures and Devices: None   Non-Invasive Evaluation(s): Cardiac CTA (12/23/2018): Multivessel CAD with hemodynamically significant disease involving the mid LAD and distal LCx. Exercise tolerance test (11/06/2018): Intermediate risk study with 1 mm horizontal ST depressions and frequent PVCs during stress.  Recent CV Pertinent Labs: Lab Results  Component Value Date   CHOL 142 03/15/2021   CHOL 124 02/12/2019   HDL 67 03/15/2021   LDLCALC 63 03/15/2021   TRIG 55 03/15/2021   TRIG 58 02/12/2019   CHOLHDL 1.9 08/25/2020   K 3.5 07/22/2021   K 4.0 01/06/2014   MG 1.5 (L) 09/14/2020   BUN 19 07/22/2021   BUN 15 09/14/2020   BUN 22 (H) 01/06/2014   CREATININE 0.95 07/22/2021   CREATININE 1.13 01/06/2014    Past medical and surgical history were reviewed and updated in EPIC.  Current Meds  Medication Sig   allopurinol (ZYLOPRIM) 300 MG tablet Take 1 tablet (300 mg total) by mouth daily.   amLODipine (NORVASC) 10 MG tablet Take 1 tablet (10 mg total) by mouth daily.   atorvastatin (LIPITOR) 40 MG tablet Take 1 tablet (40 mg total) by mouth daily.   clopidogrel (PLAVIX) 75 MG tablet Take 1 tablet (75 mg total) by mouth daily with breakfast.   Coenzyme Q10 (CO Q 10 PO) Take 1 capsule by mouth daily.   furosemide (LASIX) 20 MG tablet Take 1 tablet (20 mg total) by mouth  daily.   LORazepam (ATIVAN) 1 MG tablet Take 0.5-1 tablets (0.5-1 mg total) by mouth daily as needed for anxiety.   losartan (COZAAR) 100 MG tablet TAKE 1 TABLET(100 MG) BY MOUTH DAILY   metoprolol tartrate (LOPRESSOR) 25 MG tablet TAKE 1  TABLET(25 MG) BY MOUTH TWICE DAILY   nitroGLYCERIN (NITROSTAT) 0.4 MG SL tablet Place 1 tablet (0.4 mg total) under the tongue every 5 (five) minutes as needed for chest pain. Maximum of 3 doses.   OVER THE COUNTER MEDICATION AGI (agricultural greens) powder form by mouth once daily   pantoprazole (PROTONIX) 20 MG tablet Take 1 tablet (20 mg total) by mouth 2 (two) times daily.   sertraline (ZOLOFT) 50 MG tablet Take 1 tablet (50 mg total) by mouth daily.    Allergies: Patient has no known allergies.  Social History   Tobacco Use   Smoking status: Never   Smokeless tobacco: Never  Vaping Use   Vaping Use: Never used  Substance Use Topics   Alcohol use: Yes    Alcohol/week: 7.0 standard drinks    Types: 7 Standard drinks or equivalent per week    Comment: moderate   Drug use: No    Family History  Adopted: Yes  Problem Relation Age of Onset   Heart attack Mother 8   Pancreatic cancer Father        died in WWII - pt adopted   Hypertension Sister    Hyperlipidemia Sister    Hypertension Brother    Hyperlipidemia Brother    Prostate cancer Neg Hx    Kidney cancer Neg Hx    Bladder Cancer Neg Hx     Review of Systems: A 12-system review of systems was performed and was negative except as noted in the HPI.  --------------------------------------------------------------------------------------------------  Physical Exam: BP (!) 164/84 (BP Location: Left Arm, Patient Position: Sitting, Cuff Size: Large)   Pulse 73   Ht 5\' 11"  (1.803 m)   Wt 275 lb (124.7 kg)   SpO2 95%   BMI 38.35 kg/m   General:  NAD. Neck: No JVD or HJR. Lungs: Clear to auscultation bilaterally without wheezes or crackles. Heart: Regular rate and rhythm without murmurs, rubs, or gallops. Abdomen: Soft, nontender, nondistended. Extremities: No lower extremity edema.  EKG: Normal sinus rhythm with nonspecific T wave abnormality.  No significant change from prior tracing on 02/23/2021.  Lab  Results  Component Value Date   WBC 7.2 07/22/2021   HGB 13.8 07/22/2021   HCT 40.3 07/22/2021   MCV 101.8 (H) 07/22/2021   PLT 294 07/22/2021    Lab Results  Component Value Date   NA 138 07/22/2021   K 3.5 07/22/2021   CL 99 07/22/2021   CO2 26 07/22/2021   BUN 19 07/22/2021   CREATININE 0.95 07/22/2021   GLUCOSE 139 (H) 07/22/2021   ALT 34 07/22/2021    Lab Results  Component Value Date   CHOL 142 03/15/2021   HDL 67 03/15/2021   LDLCALC 63 03/15/2021   TRIG 55 03/15/2021   CHOLHDL 1.9 08/25/2020    --------------------------------------------------------------------------------------------------  ASSESSMENT AND PLAN: Coronary artery disease with stable angina: [name] continues to do well without recurrent angina status post PCI and on his antianginal regimen of metoprolol and amlodipine.  I have encouraged him to continue with his exercise regimen.  We will defer medication changes today, continuing indefinite clopidogrel.  Chronic HFpEF: [name] appears euvolemic with stable NYHA class II symptoms.  Continue current medications including low-dose  furosemide.  Sodium restriction encouraged.  Hypertension: Blood pressure not well controlled today but typically better at home.  We have discussed lifestyle modifications including continued weight loss through diet and exercise and sodium restriction.  We have agreed to defer medication changes today with ongoing monitoring of blood pressure at home.  [name] should alert [place] if his blood pressure is consistently above 140/90.  Hyperlipidemia: Lipids at goal on last check in April.  Continue atorvastatin 40 mg daily.  Follow-up: Return to clinic in 3 months to reassess blood pressure and weight loss.  Nelva Bush, MD 08/31/2021 9:25 AM

## 2021-09-02 ENCOUNTER — Encounter: Payer: Self-pay | Admitting: Internal Medicine

## 2021-09-09 ENCOUNTER — Other Ambulatory Visit: Payer: Self-pay | Admitting: Nurse Practitioner

## 2021-09-09 DIAGNOSIS — I10 Essential (primary) hypertension: Secondary | ICD-10-CM

## 2021-09-09 NOTE — Telephone Encounter (Signed)
Requested medication (s) are due for refill today: Yes  Requested medication (s) are on the active medication list: Yes  Last refill:  09/03/20  Future visit scheduled: Yes  Notes to clinic:  Prescription has expired.    Requested Prescriptions  Pending Prescriptions Disp Refills   amLODipine (NORVASC) 10 MG tablet [Pharmacy Med Name: AMLODIPINE BESYLATE 10MG  TABLETS] 90 tablet 4    Sig: TAKE 1 TABLET(10 MG) BY MOUTH DAILY     Cardiovascular:  Calcium Channel Blockers Failed - 09/09/2021 11:21 AM      Failed - Last BP in normal range    BP Readings from Last 1 Encounters:  08/31/21 (!) 164/84          Passed - Valid encounter within last 6 months    Recent Outpatient Visits           4 months ago Morbid obesity ([place])   [place], Jordan T, NP   4 months ago Cough   [place], [place] T, NP   5 months ago 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

## 2021-09-14 DIAGNOSIS — R7309 Other abnormal glucose: Secondary | ICD-10-CM | POA: Insufficient documentation

## 2021-09-14 DIAGNOSIS — R7303 Prediabetes: Secondary | ICD-10-CM | POA: Insufficient documentation

## 2021-09-15 ENCOUNTER — Other Ambulatory Visit: Payer: Self-pay

## 2021-09-15 ENCOUNTER — Encounter: Payer: Self-pay | Admitting: Nurse Practitioner

## 2021-09-15 ENCOUNTER — Ambulatory Visit (INDEPENDENT_AMBULATORY_CARE_PROVIDER_SITE_OTHER): Payer: Medicare Other | Admitting: Nurse Practitioner

## 2021-09-15 VITALS — BP 136/78 | HR 79 | Temp 97.7°F | Ht 68.0 in | Wt 274.0 lb

## 2021-09-15 DIAGNOSIS — M1A0720 Idiopathic chronic gout, left ankle and foot, without tophus (tophi): Secondary | ICD-10-CM | POA: Diagnosis not present

## 2021-09-15 DIAGNOSIS — N400 Enlarged prostate without lower urinary tract symptoms: Secondary | ICD-10-CM | POA: Diagnosis not present

## 2021-09-15 DIAGNOSIS — Z23 Encounter for immunization: Secondary | ICD-10-CM | POA: Diagnosis not present

## 2021-09-15 DIAGNOSIS — F419 Anxiety disorder, unspecified: Secondary | ICD-10-CM | POA: Diagnosis not present

## 2021-09-15 DIAGNOSIS — I10 Essential (primary) hypertension: Secondary | ICD-10-CM

## 2021-09-15 DIAGNOSIS — Z79899 Other long term (current) drug therapy: Secondary | ICD-10-CM

## 2021-09-15 DIAGNOSIS — C884 Extranodal marginal zone b-cell lymphoma of mucosa-associated lymphoid tissue (malt-lymphoma) not having achieved remission: Secondary | ICD-10-CM

## 2021-09-15 DIAGNOSIS — I5032 Chronic diastolic (congestive) heart failure: Secondary | ICD-10-CM | POA: Diagnosis not present

## 2021-09-15 DIAGNOSIS — F339 Major depressive disorder, recurrent, unspecified: Secondary | ICD-10-CM

## 2021-09-15 DIAGNOSIS — E785 Hyperlipidemia, unspecified: Secondary | ICD-10-CM | POA: Diagnosis not present

## 2021-09-15 DIAGNOSIS — D692 Other nonthrombocytopenic purpura: Secondary | ICD-10-CM

## 2021-09-15 DIAGNOSIS — K2270 Barrett's esophagus without dysplasia: Secondary | ICD-10-CM

## 2021-09-15 DIAGNOSIS — I2510 Atherosclerotic heart disease of native coronary artery without angina pectoris: Secondary | ICD-10-CM

## 2021-09-15 DIAGNOSIS — R7309 Other abnormal glucose: Secondary | ICD-10-CM | POA: Diagnosis not present

## 2021-09-15 DIAGNOSIS — Z0000 Encounter for general adult medical examination without abnormal findings: Secondary | ICD-10-CM

## 2021-09-15 DIAGNOSIS — K22719 Barrett's esophagus with dysplasia, unspecified: Secondary | ICD-10-CM

## 2021-09-15 LAB — BAYER DCA HB A1C WAIVED: HB A1C (BAYER DCA - WAIVED): 5.9 % — ABNORMAL HIGH (ref 4.8–5.6)

## 2021-09-15 LAB — MICROALBUMIN, URINE WAIVED
Creatinine, Urine Waived: 300 mg/dL (ref 10–300)
Microalb, Ur Waived: 80 mg/L — ABNORMAL HIGH (ref 0–19)

## 2021-09-15 MED ORDER — SHINGRIX 50 MCG/0.5ML IM SUSR
0.5000 mL | Freq: Once | INTRAMUSCULAR | 0 refills | Status: AC
Start: 2021-09-15 — End: 2021-09-15

## 2021-09-15 MED ORDER — SERTRALINE HCL 50 MG PO TABS: 50.0000 mg | ORAL_TABLET | Freq: Every day | ORAL | 4 refills | Status: DC

## 2021-09-15 MED ORDER — LORAZEPAM 1 MG PO TABS: 0.5000 mg | ORAL_TABLET | Freq: Every day | ORAL | 1 refills | Status: DC | PRN

## 2021-09-15 MED ORDER — ATORVASTATIN CALCIUM 40 MG PO TABS: 40.0000 mg | ORAL_TABLET | Freq: Every day | ORAL | 4 refills | Status: DC

## 2021-09-15 MED ORDER — CLOPIDOGREL BISULFATE 75 MG PO TABS: 75.0000 mg | ORAL_TABLET | Freq: Every day | ORAL | 4 refills | Status: DC

## 2021-09-15 MED ORDER — PANTOPRAZOLE SODIUM 20 MG PO TBEC: 20.0000 mg | DELAYED_RELEASE_TABLET | Freq: Two times a day (BID) | ORAL | 4 refills | Status: DC

## 2021-09-15 MED ORDER — SHINGRIX 50 MCG/0.5ML IM SUSR: 0.5000 mL | Freq: Once | INTRAMUSCULAR | 0 refills | Status: DC

## 2021-09-15 NOTE — Assessment & Plan Note (Signed)
Chronic, ongoing with some mild elevations in BP.  Continue current medication regimen and adjust as needed, sees cardiology will defer changes to them at upcoming visit. Labs: CBC, CMP, TSH.  He declines OSA testing.  Recommend he continue to monitor BP at home and document + focus on DASH diet and regular exercise.  Return in 6 months.

## 2021-09-15 NOTE — Assessment & Plan Note (Signed)
BMI 41.66 with underlying cardiac issues.  Recommended eating smaller high protein, low fat meals more frequently and exercising 30 mins a day 5 times a week with a goal of 10-15lb weight loss in the next 3 months. Patient voiced their understanding and motivation to adhere to these recommendations.

## 2021-09-15 NOTE — Assessment & Plan Note (Signed)
Chronic, ongoing.  Continue collaboration with oncology, appreciate their ongoing input.

## 2021-09-15 NOTE — Patient Instructions (Signed)
Prediabetes Eating Plan °Prediabetes is a condition that causes blood sugar (glucose) levels to be higher than normal. This increases the risk for developing type 2 diabetes (type 2 diabetes mellitus). Working with a health care provider or nutrition specialist (dietitian) to make diet and lifestyle changes can help prevent the onset of diabetes. These changes may help you: °Control your blood glucose levels. °Improve your cholesterol levels. °Manage your blood pressure. °What are tips for following this plan? °Reading food labels °Read food labels to check the amount of fat, salt (sodium), and sugar in prepackaged foods. Avoid foods that have: °Saturated fats. °Trans fats. °Added sugars. °Avoid foods that have more than 300 milligrams (mg) of sodium per serving. Limit your sodium intake to less than 2,300 mg each day. °Shopping °Avoid buying pre-made and processed foods. °Avoid buying drinks with added sugar. °Cooking °Cook with olive oil. Do not use butter, lard, or ghee. °Bake, broil, grill, steam, or boil foods. Avoid frying. °Meal planning ° °Work with your dietitian to create an eating plan that is right for you. This may include tracking how many calories you take in each day. Use a food diary, notebook, or mobile application to track what you eat at each meal. °Consider following a Mediterranean diet. This includes: °Eating several servings of fresh fruits and vegetables each day. °Eating fish at least twice a week. °Eating one serving each day of whole grains, beans, nuts, and seeds. °Using olive oil instead of other fats. °Limiting alcohol. °Limiting red meat. °Using nonfat or low-fat dairy products. °Consider following a plant-based diet. This includes dietary choices that focus on eating mostly vegetables and fruit, grains, beans, nuts, and seeds. °If you have high blood pressure, you may need to limit your sodium intake or follow a diet such as the DASH (Dietary Approaches to Stop Hypertension) eating  plan. The DASH diet aims to lower high blood pressure. °Lifestyle °Set weight loss goals with help from your health care team. It is recommended that most people with prediabetes lose 7% of their body weight. °Exercise for at least 30 minutes 5 or more days a week. °Attend a support group or seek support from a mental health counselor. °Take over-the-counter and prescription medicines only as told by your health care provider. °What foods are recommended? °Fruits °Berries. Bananas. Apples. Oranges. Grapes. Papaya. Mango. Pomegranate. Kiwi. Grapefruit. Cherries. °Vegetables °Lettuce. Spinach. Peas. Beets. Cauliflower. Cabbage. Broccoli. Carrots. Tomatoes. Squash. Eggplant. Herbs. Peppers. Onions. Cucumbers. Brussels sprouts. °Grains °Whole grains, such as whole-wheat or whole-grain breads, crackers, cereals, and pasta. Unsweetened oatmeal. Bulgur. Barley. Quinoa. Brown rice. Corn or whole-wheat flour tortillas or taco shells. °Meats and other proteins °Seafood. Poultry without skin. Lean cuts of pork and beef. Tofu. Eggs. Nuts. Beans. °Dairy °Low-fat or fat-free dairy products, such as yogurt, cottage cheese, and cheese. °Beverages °Water. Tea. Coffee. Sugar-free or diet soda. Seltzer water. Low-fat or nonfat milk. Milk alternatives, such as soy or almond milk. °Fats and oils °Olive oil. Canola oil. Sunflower oil. Grapeseed oil. Avocado. Walnuts. °Sweets and desserts °Sugar-free or low-fat pudding. Sugar-free or low-fat ice cream and other frozen treats. °Seasonings and condiments °Herbs. Sodium-free spices. Mustard. Relish. Low-salt, low-sugar ketchup. Low-salt, low-sugar barbecue sauce. Low-fat or fat-free mayonnaise. °The items listed above may not be a complete list of recommended foods and beverages. Contact a dietitian for more information. °What foods are not recommended? °Fruits °Fruits canned with syrup. °Vegetables °Canned vegetables. Frozen vegetables with butter or cream sauce. °Grains °Refined white  flour and flour   products, such as bread, pasta, snack foods, and cereals. °Meats and other proteins °Fatty cuts of meat. Poultry with skin. Breaded or fried meat. Processed meats. °Dairy °Full-fat yogurt, cheese, or milk. °Beverages °Sweetened drinks, such as iced tea and soda. °Fats and oils °Butter. Lard. Ghee. °Sweets and desserts °Baked goods, such as cake, cupcakes, pastries, cookies, and cheesecake. °Seasonings and condiments °Spice mixes with added salt. Ketchup. Barbecue sauce. Mayonnaise. °The items listed above may not be a complete list of foods and beverages that are not recommended. Contact a dietitian for more information. °Where to find more information °American Diabetes Association: www.diabetes.org °Summary °You may need to make diet and lifestyle changes to help prevent the onset of diabetes. These changes can help you control blood sugar, improve cholesterol levels, and manage blood pressure. °Set weight loss goals with help from your health care team. It is recommended that most people with prediabetes lose 7% of their body weight. °Consider following a Mediterranean diet. This includes eating plenty of fresh fruits and vegetables, whole grains, beans, nuts, seeds, fish, and low-fat dairy, and using olive oil instead of other fats. °This information is not intended to replace advice given to you by your health care provider. Make sure you discuss any questions you have with your health care provider. °Document Revised: 02/26/2020 Document Reviewed: 02/26/2020 °Elsevier Patient Education © 2022 [employer]vier Inc. ° °

## 2021-09-15 NOTE — Assessment & Plan Note (Signed)
Chronic, ongoing.  At length discussion on risks of long term benzo use, he wishes to continue treatment and reports using minimally.  Refills sent #30 pills with 1 refill.  This often lasts 6 months.  Return in 6 months.  UDS up to date and contract placed in chart.

## 2021-09-15 NOTE — Assessment & Plan Note (Signed)
Chronic, ongoing.  Continue current medication and adjust as needed.  Lipid panel today.

## 2021-09-15 NOTE — Assessment & Plan Note (Signed)
Chronic, stable.  Denies SI/HI.  Continue current medication regimen and adjust as needed.  Recommend relaxation and meditation techniques at home.  Return in 6 months. 

## 2021-09-15 NOTE — Assessment & Plan Note (Signed)
Chronic, stable.  No recent NTG use.  Continue current medication regimen and collaboration with cardiology.  To alert provider immediately if CP presents or go immediately to ER. 

## 2021-09-15 NOTE — Progress Notes (Signed)
BP 136/78 (BP Location: Left Arm, Patient Position: Sitting, Cuff Size: Large)   Pulse 79   Temp 97.7 F (36.5 C) (Oral)   Ht 5\' 8"  (1.727 m)   Wt 274 lb (124.3 kg)   SpO2 97%   BMI 41.66 kg/m    Subjective:    Patient ID: Jesse Norton, male    DOB: 10/14/1946, 75 y.o.   MRN: [id number]  HPI: Jesse Norton is a 75 y.o. male presenting on 09/15/2021 for examination. Current medical complaints include:none  He currently lives with: wife Interim Problems from his last visit: no   HYPERTENSION / HYPERLIPIDEMIA/HF Continues on Losartan, Metoprolol, Lasix, Amlodipine, and Plavix + ASA.  Followed by cardiology for CAD, HFpEF and HTN, last seen 08/31/21 -- BP not well-controlled at visit they deferred medication changes to allow for assessment over next 3 months -- they will discuss medication changes then.  No recent NTG use.    Recent A1c in April was 6.1% -- has been working diet.    Continues on Allopurinol daily for history of gout flares. Satisfied with current treatment? yes Duration of hypertension: chronic BP monitoring frequency: daily BP range: September 141/84, 139/79, 139/88 -- October 140/86, 139/82, 142/80, 140/80 BP medication side effects: no Duration of hyperlipidemia: chronic Cholesterol medication side effects: no Cholesterol supplements: none Medication compliance: good compliance Aspirin: yes Recent stressors: no Recurrent headaches: no Visual changes: no Palpitations: no Dyspnea: no Chest pain: no Lower extremity edema: no Dizzy/lightheaded: no   B-CELL LYMPHOMA: Followed by oncology and last seen 07/22/21.  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  Remains stable on review records.  Continues on Protonix for Barrett's esophagus.  Denies B symptoms.  Has seen urology in past for stricture, 08/26/20, and last PSA 5.8.  ANXIETY/STRESS Continues on Ativan 0.5 to 1 MG as needed daily  and Sertraline 50 MG daily.  Takes Ativan as needed only.  He often gets 4 refills a year. Pt is aware of risks of psychoactive medication use to include increased sedation, respiratory suppression, falls, extrapyramidal movements,  dependence and cardiovascular events.  Pt would like to continue treatment as benefit determined to outweigh risk.  On review of PMP last Ativan fill 07/31/21 for #30 tablets.   Duration: stable Anxious mood: none Excessive worrying: none Irritability: none Sweating: no Nausea: no Palpitations:no Hyperventilation: no Panic attacks: a slight one recently x 1 Agoraphobia: no  Obscessions/compulsions: no Depressed mood: no Depression screen [facility] 2/9 09/15/2021 06/20/2021 03/15/2021 09/14/2020 06/21/2020  Decreased Interest 0 0 0 0 0  Down, Depressed, Hopeless 0 0 0 0 0  PHQ - 2 Score 0 0 0 0 0  Altered sleeping 0 - 0 0 1  Tired, decreased energy 0 - 0 0 1  Change in appetite 0 - 0 0 0  Feeling bad or failure about yourself  0 - 0 0 0  Trouble concentrating 0 - 0 0 0  Moving slowly or fidgety/restless 0 - 0 0 0  Suicidal thoughts 0 - 0 0 0  PHQ-9 Score 0 - 0 0 2  Difficult doing work/chores Not difficult at all - - Not difficult at all Not difficult at all  Some recent data might be hidden   GAD 7 : Generalized Anxiety Score 09/15/2021 09/14/2020 06/21/2020 02/17/2020  Nervous, Anxious, on Edge 1 0 1 1  Control/stop worrying 0 0 1 1  Worry too much - different things 0  0 1 0  Trouble relaxing 0 0 0 0  Restless 0 0 0 0  Easily annoyed or irritable 0 0 0 0  Afraid - awful might happen 0 0 0 0  Total GAD 7 Score 1 0 3 2  Anxiety Difficulty Not difficult at all Not difficult at all Somewhat difficult Not difficult at all   Functional Status Survey: Is the patient deaf or have difficulty hearing?: No Does the patient have difficulty seeing, even when wearing glasses/contacts?: No Does the patient have difficulty concentrating, remembering, or making decisions?:  No Does the patient have difficulty walking or climbing stairs?: No Does the patient have difficulty dressing or bathing?: No Does the patient have difficulty doing errands alone such as visiting a doctor's office or shopping?: No  FALL RISK: Fall Risk  06/20/2021 09/14/2020 06/11/2020 06/04/2019 02/13/2019  Falls in the past year? 1 1 1  0 0  Comment fell out of bed - tripped over dog cage - -  Number falls in past yr: 0 1 0 - -  Injury with Fall? 0 0 0 - 0  Risk for fall due to : Medication side effect No Fall Risks Medication side effect;History of fall(s);Impaired balance/gait - -  Follow up Falls evaluation completed;Education provided;Falls prevention discussed Falls evaluation completed Falls evaluation completed;Education provided;Falls prevention discussed - -   Advanced Directives <no information>  Past Medical History:  Past Medical History:  Diagnosis Date   (HFpEF) heart failure with preserved ejection fraction ([place])    a. 01/2019 Cath: EF 55-60% w/ elevated LVEDP.   Anxiety    Barrett's esophagus    CAD (coronary artery disease)    a. 10/2018 ETT: 28mm horizontal inflat ST depression, freq PVC's->Intermediate; b. 12/2018 Cardiac CTA: LM nl, LAD FFRct 0.6 in mLAD, LCX FFRct 0.99p, 0.77m, 0.77d, RCA no signif dzs, FFRct 0.89d->Rec cath; c. 01/2019 PCI: LM nl, LAD 40p, 69m, D2 70, LCX 70p (2.75x15 Resolute Onyx DES), RCA min irregs. EF 55-65%. EDP 25-41mmHg; d. 01/2019 PCI: LAD 80-90 (atherectomy & 3x15 Resolute Onyx DES).   Colon polyps    Duodenal adenoma    Gastritis    GERD (gastroesophageal reflux disease)    Gout    H. pylori infection    H/O urethral stricture    Hemorrhoids    Hiatal hernia    Hypertension    Stomach ulcer     Surgical History:  Past Surgical History:  Procedure Laterality Date   CARDIAC CATHETERIZATION     COLONOSCOPY     COLONOSCOPY WITH PROPOFOL N/A 08/20/2018   Procedure: COLONOSCOPY WITH PROPOFOL;  Surgeon: Lollie Sails, MD;  Location:  [facility] ENDOSCOPY;  Service: Endoscopy;  Laterality: N/A;   COLONOSCOPY WITH PROPOFOL N/A 12/19/2018   Procedure: COLONOSCOPY WITH PROPOFOL;  Surgeon: Lollie Sails, MD;  Location: [facility] ENDOSCOPY;  Service: Endoscopy;  Laterality: N/A;   CORONARY ATHERECTOMY N/A 02/03/2019   Procedure: CORONARY ATHERECTOMY;  Surgeon: Nelva Bush, MD;  Location: [place] CV LAB;  Service: Cardiovascular;  Laterality: N/A;   CORONARY STENT INTERVENTION N/A 01/21/2019   Procedure: CORONARY STENT INTERVENTION;  Surgeon: Nelva Bush, MD;  Location: [place] CV LAB;  Service: Cardiovascular;  Laterality: N/A;   CORONARY STENT INTERVENTION N/A 02/03/2019   Procedure: CORONARY STENT INTERVENTION;  Surgeon: Nelva Bush, MD;  Location: [place] CV LAB;  Service: Cardiovascular;  Laterality: N/A;   CYSTOSCOPY WITH URETHRAL DILATATION N/A 08/16/2018   Procedure: CYSTOSCOPY WITH URETHRAL DILATATION;  Surgeon: [name], MD;  Location: [facility] ORS;  Service: Urology;  Laterality: N/A;   ESOPHAGOGASTRODUODENOSCOPY N/A 01/29/2017   Procedure: ESOPHAGOGASTRODUODENOSCOPY (EGD);  Surgeon: Lollie Sails, MD;  Location: [facility] ENDOSCOPY;  Service: Endoscopy;  Laterality: N/A;   ESOPHAGOGASTRODUODENOSCOPY (EGD) WITH PROPOFOL N/A 07/10/2016   Procedure: ESOPHAGOGASTRODUODENOSCOPY (EGD) WITH PROPOFOL;  Surgeon: Lollie Sails, MD;  Location: [facility] ENDOSCOPY;  Service: Endoscopy;  Laterality: N/A;   ESOPHAGOGASTRODUODENOSCOPY (EGD) WITH PROPOFOL N/A 12/19/2018   Procedure: ESOPHAGOGASTRODUODENOSCOPY (EGD) WITH PROPOFOL;  Surgeon: Lollie Sails, MD;  Location: [facility] ENDOSCOPY;  Service: Endoscopy;  Laterality: N/A;   gastritis     h.pylori     INTRAVASCULAR ULTRASOUND/IVUS N/A 02/03/2019   Procedure: Intravascular Ultrasound/IVUS;  Surgeon: Nelva Bush, MD;  Location: [place] CV LAB;  Service: Cardiovascular;  Laterality: N/A;   left elbow repair Left    LEFT HEART CATH AND CORONARY  ANGIOGRAPHY Left 01/21/2019   Procedure: LEFT HEART CATH AND CORONARY ANGIOGRAPHY;  Surgeon: Nelva Bush, MD;  Location: [place] CV LAB;  Service: Cardiovascular;  Laterality: Left;   SPLENECTOMY  1957   TONSILLECTOMY      Medications:  Current Outpatient Medications on File Prior to Visit  Medication Sig   allopurinol (ZYLOPRIM) 300 MG tablet Take 1 tablet (300 mg total) by mouth daily.   amLODipine (NORVASC) 10 MG tablet TAKE 1 TABLET(10 MG) BY MOUTH DAILY   atorvastatin (LIPITOR) 40 MG tablet Take 1 tablet (40 mg total) by mouth daily.   clopidogrel (PLAVIX) 75 MG tablet Take 1 tablet (75 mg total) by mouth daily with breakfast.   Coenzyme Q10 (CO Q 10 PO) Take 1 capsule by mouth daily.   furosemide (LASIX) 20 MG tablet Take 1 tablet (20 mg total) by mouth daily.   LORazepam (ATIVAN) 1 MG tablet Take 0.5-1 tablets (0.5-1 mg total) by mouth daily as needed for anxiety.   losartan (COZAAR) 100 MG tablet TAKE 1 TABLET(100 MG) BY MOUTH DAILY   metoprolol tartrate (LOPRESSOR) 25 MG tablet TAKE 1 TABLET(25 MG) BY MOUTH TWICE DAILY   nitroGLYCERIN (NITROSTAT) 0.4 MG SL tablet Place 1 tablet (0.4 mg total) under the tongue every 5 (five) minutes as needed for chest pain. Maximum of 3 doses.   omega-3 acid ethyl esters (LOVAZA) 1 g capsule Take by mouth daily.   OVER THE COUNTER MEDICATION AGI (agricultural greens) powder form by mouth once daily   pantoprazole (PROTONIX) 20 MG tablet Take 1 tablet (20 mg total) by mouth 2 (two) times daily.   sertraline (ZOLOFT) 50 MG tablet Take 1 tablet (50 mg total) by mouth daily.   No current facility-administered medications on file prior to visit.    Allergies:  No Known Allergies  Social History:  Social History   Socioeconomic History   Marital status: Married    Spouse name: Not on file   Number of children: Not on file   Years of education: Not on file   Highest education level: Master's degree (e.g., MA, MS, MEng, MEd, MSW,  MBA)  Occupational History   Occupation: retired   Tobacco Use   Smoking status: Never   Smokeless tobacco: Never  Vaping Use   Vaping Use: Never used  Substance and Sexual Activity   Alcohol use: Yes    Alcohol/week: 7.0 standard drinks    Types: 7 Standard drinks or equivalent per week    Comment: moderate   Drug use: No   Sexual activity: Yes  Other Topics Concern   Not on file  Social  History Narrative   Rides bicycle with friends       No smoking.  Occasional alcohol.  Lives at home.  Retired Customer service manager.   Social Determinants of Health   Financial Resource Strain: Low Risk    Difficulty of Paying Living Expenses: Not hard at all  Food Insecurity: No Food Insecurity   Worried About Charity fundraiser in the Last Year: Never true   [place] in the Last Year: Never true  Transportation Needs: No Transportation Needs   Lack of Transportation (Medical): No   Lack of Transportation (Non-Medical): No  Physical Activity: Insufficiently Active   Days of Exercise per Week: 4 days   Minutes of Exercise per Session: 30 min  Stress: No Stress Concern Present   Feeling of Stress : Not at all  Social Connections: Not on file  Intimate Partner Violence: Not on file   Social History   Tobacco Use  Smoking Status Never  Smokeless Tobacco Never   Social History   Substance and Sexual Activity  Alcohol Use Yes   Alcohol/week: 7.0 standard drinks   Types: 7 Standard drinks or equivalent per week   Comment: moderate    Family History:  Family History  Adopted: Yes  Problem Relation Age of Onset   Heart attack Mother 46   Pancreatic cancer Father        died in WWII - pt adopted   Hypertension Sister    Hyperlipidemia Sister    Hypertension Brother    Hyperlipidemia Brother    Prostate cancer Neg Hx    Kidney cancer Neg Hx    Bladder Cancer Neg Hx     Past medical history, surgical history, medications, allergies, family history and social history reviewed  with patient today and changes made to appropriate areas of the chart.   Review of Systems - negative All other ROS negative except what is listed above and in the HPI.      Objective:    BP 136/78 (BP Location: Left Arm, Patient Position: Sitting, Cuff Size: Large)   Pulse 79   Temp 97.7 F (36.5 C) (Oral)   Ht 5\' 8"  (1.727 m)   Wt 274 lb (124.3 kg)   SpO2 97%   BMI 41.66 kg/m   Wt Readings from Last 3 Encounters:  09/15/21 274 lb (124.3 kg)  08/31/21 275 lb (124.7 kg)  07/22/21 267 lb (121.1 kg)    Physical Exam Vitals and nursing note reviewed.  Constitutional:      General: He is awake. He is not in acute distress.    Appearance: He is well-developed and well-groomed. He is obese. He is not ill-appearing.  HENT:     Head: Normocephalic and atraumatic.     Right Ear: Hearing, tympanic membrane, ear canal and external ear normal. No drainage.     Left Ear: Hearing, tympanic membrane, ear canal and external ear normal. No drainage.     Nose: Nose normal.     Mouth/Throat:     Pharynx: Uvula midline.  Eyes:     General: Lids are normal.        Right eye: No discharge.        Left eye: No discharge.     Extraocular Movements: Extraocular movements intact.     Conjunctiva/sclera: Conjunctivae normal.     Pupils: Pupils are equal, round, and reactive to light.     Visual Fields: Right eye visual fields normal and left eye visual fields  normal.  Neck:     Thyroid: No thyromegaly.     Vascular: No carotid bruit or JVD.     Trachea: Trachea normal.  Cardiovascular:     Rate and Rhythm: Normal rate and regular rhythm.     Heart sounds: Normal heart sounds, S1 normal and S2 normal. No murmur heard.   No gallop.  Pulmonary:     Effort: Pulmonary effort is normal. No accessory muscle usage or respiratory distress.     Breath sounds: Normal breath sounds.  Abdominal:     General: Bowel sounds are normal.     Palpations: Abdomen is soft. There is no hepatomegaly or  splenomegaly.     Tenderness: There is no abdominal tenderness.  Musculoskeletal:        General: Normal range of motion.     Cervical back: Normal range of motion and neck supple.     Right lower leg: No edema.     Left lower leg: No edema.  Lymphadenopathy:     Head:     Right side of head: No submental, submandibular, tonsillar, preauricular or posterior auricular adenopathy.     Left side of head: No submental, submandibular, tonsillar, preauricular or posterior auricular adenopathy.     Cervical: No cervical adenopathy.  Skin:    General: Skin is warm and dry.     Capillary Refill: Capillary refill takes less than 2 seconds.     Findings: No rash.     Comments: Scattered small pale purple bruises noted BUE, no skin breakdown.  Neurological:     Mental Status: He is alert and oriented to person, place, and time.     Cranial Nerves: Cranial nerves are intact.     Gait: Gait is intact.     Deep Tendon Reflexes: Reflexes are normal and symmetric.     Reflex Scores:      Brachioradialis reflexes are 2+ on the right side and 2+ on the left side.      Patellar reflexes are 2+ on the right side and 2+ on the left side. Psychiatric:        Attention and Perception: Attention normal.        Mood and Affect: Mood normal.        Speech: Speech normal.        Behavior: Behavior normal. Behavior is cooperative.        Thought Content: Thought content normal.        Cognition and Memory: Cognition normal.        Judgment: Judgment normal.   Results for orders placed or performed in visit on 07/22/21  Lactate dehydrogenase  Result Value Ref Range   LDH 166 98 - 192 U/L  Comprehensive metabolic panel  Result Value Ref Range   Sodium 138 135 - 145 mmol/L   Potassium 3.5 3.5 - 5.1 mmol/L   Chloride 99 98 - 111 mmol/L   CO2 26 22 - 32 mmol/L   Glucose, Bld 139 (H) 70 - 99 mg/dL   BUN 19 8 - 23 mg/dL   Creatinine, Ser 0.95 0.61 - 1.24 mg/dL   Calcium 9.0 8.9 - 10.3 mg/dL   Total  Protein 7.7 6.5 - 8.1 g/dL   Albumin 4.4 3.5 - 5.0 g/dL   AST 57 (H) 15 - 41 U/L   ALT 34 0 - 44 U/L   Alkaline Phosphatase 68 38 - 126 U/L   Total Bilirubin 1.3 (H) 0.3 - 1.2 mg/dL   GFR,  Estimated >60 >60 mL/min   Anion gap 13 5 - 15  CBC with Differential/Platelet  Result Value Ref Range   WBC 7.2 4.0 - 10.5 K/uL   RBC 3.96 (L) 4.22 - 5.81 MIL/uL   Hemoglobin 13.8 13.0 - 17.0 g/dL   HCT 40.3 39.0 - 52.0 %   MCV 101.8 (H) 80.0 - 100.0 fL   MCH 34.8 (H) 26.0 - 34.0 pg   MCHC 34.2 30.0 - 36.0 g/dL   RDW 12.8 11.5 - 15.5 %   Platelets 294 150 - 400 K/uL   nRBC 0.6 (H) 0.0 - 0.2 %   Neutrophils Relative % 50 %   Neutro Abs 3.6 1.7 - 7.7 K/uL   Lymphocytes Relative 33 %   Lymphs Abs 2.4 0.7 - 4.0 K/uL   Monocytes Relative 11 %   Monocytes Absolute 0.8 0.1 - 1.0 K/uL   Eosinophils Relative 3 %   Eosinophils Absolute 0.2 0.0 - 0.5 K/uL   Basophils Relative 2 %   Basophils Absolute 0.1 0.0 - 0.1 K/uL   Immature Granulocytes 1 %   Abs Immature Granulocytes 0.04 0.00 - 0.07 K/uL      Assessment & Plan:   Problem List Items Addressed This Visit       Cardiovascular and Mediastinum   Essential hypertension    Chronic, ongoing with some mild elevations in BP.  Continue current medication regimen and adjust as needed, sees cardiology will defer changes to them at upcoming visit. Labs: CBC, CMP, TSH.  He declines OSA testing.  Recommend he continue to monitor BP at home and document + focus on DASH diet and regular exercise.  Return in 6 months.      Relevant Medications   omega-3 acid ethyl esters (LOVAZA) 1 g capsule   Other Relevant Orders   CBC with Differential/Platelet   Comprehensive metabolic panel   TSH   Microalbumin, Urine Waived   Coronary artery disease involving native coronary artery of native heart without angina pectoris    Chronic, stable.  No recent NTG use.  Continue current medication regimen and collaboration with cardiology.  To alert provider immediately  if CP presents or go immediately to ER.      Relevant Medications   omega-3 acid ethyl esters (LOVAZA) 1 g capsule   Purpura senilis (HCC)    Recommend gentle skin care at home and monitor for abrasions or skin breakdown, alert provider if present.      Relevant Medications   omega-3 acid ethyl esters (LOVAZA) 1 g capsule   Chronic heart failure with preserved ejection fraction (HCC) - Primary    Chronic, stable with no recent exacerbations.  Euvolemic.  Continue current medication regimen and collaboration with cardiology.  Recommend: - Reminded to call for an overnight weight gain of >2 pounds or a weekly weight weight of >5 pounds - not adding salt to his food and has been reading food labels. Reviewed the importance of keeping daily sodium intake to 2000mg  daily  - Avoid Ibuprofen containing products      Relevant Medications   omega-3 acid ethyl esters (LOVAZA) 1 g capsule     Digestive   Barrett's esophagus    Chronic, stable.  Continue current Protonix dose and check Mag level today.  Continue collaboration with GI.  Recommend he continue probiotic as is offering him GI benefit.      Relevant Orders   Magnesium     Genitourinary   BPH (benign prostatic hyperplasia)  Chronic, ongoing,  Well-controlled without medication at this time.  Continue to monitor, check PSA today -- was slightly elevated recent check, discussed with him.      Relevant Orders   PSA     Other   Chronic anxiety    Chronic, ongoing.  At length discussion on risks of long term benzo use, he wishes to continue treatment and reports using minimally.  Refills sent #30 pills with 1 refill.  This often lasts 6 months.  Return in 6 months.  UDS up to date and contract placed in chart.      Gout    Chronic, stable.  Controlled with Allopurinol and renal dose if needed.  Continue current medication regimen.  Uric acid level today.      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  Continue collaboration with oncology, appreciate their ongoing input.      Depression, recurrent (HCC)    Chronic, stable.  Denies SI/HI.  Continue current medication regimen and adjust as needed.  Recommend relaxation and meditation techniques at home.  Return in 6 months.      Hyperlipidemia LDL goal <70    Chronic, ongoing.  Continue current medication and adjust as needed.  Lipid panel today.      Relevant Medications   omega-3 acid ethyl esters (LOVAZA) 1 g capsule   Other Relevant Orders   Comprehensive metabolic panel   Lipid Panel w/o Chol/HDL Ratio   Morbid obesity (HCC)    BMI 41.66 with underlying cardiac issues.  Recommended eating smaller high protein, low fat meals more frequently and exercising 30 mins a day 5 times a week with a goal of 10-15lb weight loss in the next 3 months. Patient voiced their understanding and motivation to adhere to these recommendations.       Long-term current use of benzodiazepine    Discussed at length with patient and recommend continue to reduce use.  Refer to anxiety plan.  UDS and contract up to date.      Elevated hemoglobin A1c measurement    Noted on past labs at 6.1% in April, to is 5.9%, downward trend and urine ALB 80.  Continue diet focus and ARB for kidney protection.      Relevant Orders   Bayer DCA Hb A1c Waived   Other Visit Diagnoses     Flu vaccine need       Flu vaccine today   Relevant Orders   Flu Vaccine QUAD High Dose(Fluad) (Completed)   Need for shingles vaccine       Shingrix vaccines ordered.   Relevant Medications   Zoster Vaccine Adjuvanted [facility]) injection   Zoster Vaccine Adjuvanted [facility]) injection (Start on 12/28/2021)   Encounter for annual physical exam       Annual labs today and health maintenance reviewed.       Discussed aspirin prophylaxis for myocardial infarction prevention and decision  was made to continue ASA  LABORATORY TESTING:  Health maintenance labs ordered today as discussed above.   The natural history of prostate cancer and ongoing controversy regarding screening and potential treatment outcomes of prostate cancer has been discussed with the patient. The meaning of a false positive PSA and a false negative PSA has been discussed. He indicates understanding of the limitations of this screening test and wishes to proceed with screening PSA testing.   IMMUNIZATIONS:   - Tdap:  Tetanus vaccination status reviewed: last tetanus booster within 10 years. - Influenza: Administered today - Pneumovax: Up to date - Prevnar: Up to date - Zostavax vaccine: Up to date  SCREENING: - Colonoscopy: Up to date due next 12/19/2021 Discussed with patient purpose of the colonoscopy is to detect colon cancer at curable precancerous or early stages   - AAA Screening: Not applicable  -Hearing Test: Not applicable  -Spirometry: Not applicable   PATIENT COUNSELING:    Sexuality: Discussed sexually transmitted diseases, partner selection, use of condoms, avoidance of unintended pregnancy  and contraceptive alternatives.   Advised to avoid cigarette smoking.  I discussed with the patient that most people either abstain from alcohol or drink within safe limits (<=14/week and <=4 drinks/occasion for males, <=7/weeks and <= 3 drinks/occasion for females) and that the risk for alcohol disorders and other health effects rises proportionally with the number of drinks per week and how often a drinker exceeds daily limits.  Discussed cessation/primary prevention of drug use and availability of treatment for abuse.   Diet: Encouraged to adjust caloric intake to maintain  or achieve ideal body weight, to reduce intake of dietary saturated fat and total fat, to limit sodium intake by avoiding high sodium foods and not adding table salt, and to maintain adequate dietary potassium and calcium  preferably from fresh fruits, vegetables, and low-fat dairy products.    Stressed the importance of regular exercise  Injury prevention: Discussed safety belts, safety helmets, smoke detector, smoking near bedding or upholstery.   Dental health: Discussed importance of regular tooth brushing, flossing, and dental visits.   Follow up plan: NEXT PREVENTATIVE PHYSICAL DUE IN 1 YEAR. Return in about 3 months (around 12/16/2021) for MOOD, HTN/HLD, HF, BPH.

## 2021-09-15 NOTE — Assessment & Plan Note (Signed)
Chronic, stable.  Controlled with Allopurinol and renal dose if needed.  Continue current medication regimen.  Uric acid level today. 

## 2021-09-15 NOTE — Assessment & Plan Note (Signed)
Noted on past labs at 6.1% in April, to is 5.9%, downward trend and urine ALB 80.  Continue diet focus and ARB for kidney protection.

## 2021-09-15 NOTE — Assessment & Plan Note (Signed)
Chronic, stable with no recent exacerbations.  Euvolemic.  Continue current medication regimen and collaboration with cardiology.  Recommend: - Reminded to call for an overnight weight gain of >2 pounds or a weekly weight weight of >5 pounds - not adding salt to his food and has been reading food labels. Reviewed the importance of keeping daily sodium intake to 2000mg  daily  - Avoid Ibuprofen containing products

## 2021-09-15 NOTE — Progress Notes (Signed)
Appt has been changed

## 2021-09-15 NOTE — Assessment & Plan Note (Signed)
Recommend gentle skin care at home and monitor for abrasions or skin breakdown, alert provider if present. 

## 2021-09-15 NOTE — Assessment & Plan Note (Signed)
Discussed at length with patient and recommend continue to reduce use.  Refer to anxiety plan.  UDS and contract up to date.

## 2021-09-15 NOTE — Addendum Note (Signed)
Addended by: Marnee Guarneri T on: 09/15/2021 01:54 PM   Modules accepted: Orders

## 2021-09-15 NOTE — Assessment & Plan Note (Signed)
Chronic, stable.  Continue current Protonix dose and check Mag level today.  Continue collaboration with GI.  Recommend he continue probiotic as is offering him GI benefit.

## 2021-09-15 NOTE — Assessment & Plan Note (Signed)
Chronic, ongoing,  Well-controlled without medication at this time.  Continue to monitor, check PSA today -- was slightly elevated recent check, discussed with him.

## 2021-09-16 LAB — COMPREHENSIVE METABOLIC PANEL
ALT: 28 IU/L (ref 0–44)
AST: 38 IU/L (ref 0–40)
Albumin/Globulin Ratio: 1.9 (ref 1.2–2.2)
Albumin: 4.6 g/dL (ref 3.7–4.7)
Alkaline Phosphatase: 84 IU/L (ref 44–121)
BUN/Creatinine Ratio: 21 (ref 10–24)
BUN: 20 mg/dL (ref 8–27)
Bilirubin Total: 1 mg/dL (ref 0.0–1.2)
CO2: 27 mmol/L (ref 20–29)
Calcium: 9.8 mg/dL (ref 8.6–10.2)
Chloride: 98 mmol/L (ref 96–106)
Creatinine, Ser: 0.94 mg/dL (ref 0.76–1.27)
Globulin, Total: 2.4 g/dL (ref 1.5–4.5)
Glucose: 102 mg/dL — ABNORMAL HIGH (ref 70–99)
Potassium: 4.3 mmol/L (ref 3.5–5.2)
Sodium: 141 mmol/L (ref 134–144)
Total Protein: 7 g/dL (ref 6.0–8.5)
eGFR: 84 mL/min/{1.73_m2} (ref >59)

## 2021-09-16 LAB — CBC WITH DIFFERENTIAL/PLATELET
Basophils Absolute: 0.1 10*3/uL (ref 0.0–0.2)
Basos: 2 %
EOS (ABSOLUTE): 0.2 10*3/uL (ref 0.0–0.4)
Eos: 3 %
Hematocrit: 42.1 % (ref 37.5–51.0)
Hemoglobin: 14.2 g/dL (ref 13.0–17.7)
Immature Grans (Abs): 0 10*3/uL (ref 0.0–0.1)
Immature Granulocytes: 0 %
Lymphocytes Absolute: 2.1 10*3/uL (ref 0.7–3.1)
Lymphs: 30 %
MCH: 34.6 pg — ABNORMAL HIGH (ref 26.6–33.0)
MCHC: 33.7 g/dL (ref 31.5–35.7)
MCV: 103 fL — ABNORMAL HIGH (ref 79–97)
Monocytes Absolute: 0.9 10*3/uL (ref 0.1–0.9)
Monocytes: 13 %
Neutrophils Absolute: 3.6 10*3/uL (ref 1.4–7.0)
Neutrophils: 52 %
Platelets: 295 10*3/uL (ref 150–450)
RBC: 4.1 x10E6/uL — ABNORMAL LOW (ref 4.14–5.80)
RDW: 12.4 % (ref 11.6–15.4)
WBC: 7 10*3/uL (ref 3.4–10.8)

## 2021-09-16 LAB — LIPID PANEL W/O CHOL/HDL RATIO
Cholesterol, Total: 142 mg/dL (ref 100–199)
HDL: 60 mg/dL (ref >39)
LDL Chol Calc (NIH): 65 mg/dL (ref 0–99)
Triglycerides: 91 mg/dL (ref 0–149)
VLDL Cholesterol Cal: 17 mg/dL (ref 5–40)

## 2021-09-16 LAB — TSH: TSH: 1.74 u[IU]/mL (ref 0.450–4.500)

## 2021-09-16 LAB — MAGNESIUM: Magnesium: 1.5 mg/dL — ABNORMAL LOW (ref 1.6–2.3)

## 2021-09-16 LAB — PSA: Prostate Specific Ag, Serum: 1.9 ng/mL (ref 0.0–4.0)

## 2021-09-16 LAB — URIC ACID: Uric Acid: 4.1 mg/dL (ref 3.8–8.4)

## 2021-09-16 NOTE — Progress Notes (Signed)
Contacted via MyChart   Good evening Jesse Norton, your labs have returned: - CBC shows no anemia - Kidney function, creatinine and eGFR, is normal, as is liver function, AST and ALT. - Prostate lab is normal this check - Thyroid and cholesterol labs look great.  Uric acid is normal. - Magnesium remains a little on low side, suspect some of this is related to your Protonix, but we need you to stay on this.  Recommend you start taking Magnesium supplement 400 MG once to twice a day -- can obtain this in the vitamin section.  Any questions? Keep being amazing!!  Thank you for allowing me to participate in your care.  I appreciate you. Kindest regards, Anil Havard

## 2021-10-16 ENCOUNTER — Other Ambulatory Visit: Payer: Self-pay | Admitting: Nurse Practitioner

## 2021-10-16 NOTE — Telephone Encounter (Signed)
last RF 09/15/21 #90 4 RF

## 2021-11-21 ENCOUNTER — Other Ambulatory Visit: Payer: Self-pay | Admitting: Nurse Practitioner

## 2021-11-21 NOTE — Telephone Encounter (Signed)
Called again at 5:21 pm  - more than 3 callers ahead of me. Will try back.

## 2021-11-21 NOTE — Telephone Encounter (Signed)
Called [facility]. Per Tech refills were cancelled d/t unclear wording on Rx. [facility] is refilling Rx now for pt. Pick up tomorrow. Called pt and LMOM to p/u Rx tomorrow.  Requested Prescriptions  Pending Prescriptions Disp Refills  . metoprolol tartrate (LOPRESSOR) 25 MG tablet [Pharmacy Med Name: METOPROLOL TARTRATE 25MG  TABLETS] 180 tablet 4    Sig: TAKE 1 TABLET(25 MG) BY MOUTH TWICE DAILY     Cardiovascular:  Beta Blockers Passed - 11/21/2021  1:07 PM      Passed - Last BP in normal range    BP Readings from Last 1 Encounters:  09/15/21 136/78         Passed - Last Heart Rate in normal range    Pulse Readings from Last 1 Encounters:  09/15/21 79         Passed - Valid encounter within last 6 months    Recent Outpatient Visits          2 months ago Chronic heart failure with preserved ejection fraction ([place])   [place], [place] T, NP   6 months ago Morbid obesity ([place])   [place], [place] T, NP   7 months ago Cough   [place], [place] T, NP   8 months ago 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

## 2021-11-21 NOTE — Telephone Encounter (Signed)
Called [facility] to determine if pt had refills available  - on hold for 16 minutes.

## 2021-11-24 ENCOUNTER — Ambulatory Visit (INDEPENDENT_AMBULATORY_CARE_PROVIDER_SITE_OTHER): Payer: Medicare Other | Admitting: Internal Medicine

## 2021-11-24 ENCOUNTER — Encounter: Payer: Self-pay | Admitting: Internal Medicine

## 2021-11-24 ENCOUNTER — Other Ambulatory Visit: Payer: Self-pay

## 2021-11-24 VITALS — BP 160/84 | HR 85 | Ht 69.0 in | Wt 272.0 lb

## 2021-11-24 DIAGNOSIS — I10 Essential (primary) hypertension: Secondary | ICD-10-CM

## 2021-11-24 DIAGNOSIS — I5032 Chronic diastolic (congestive) heart failure: Secondary | ICD-10-CM | POA: Diagnosis not present

## 2021-11-24 DIAGNOSIS — E785 Hyperlipidemia, unspecified: Secondary | ICD-10-CM

## 2021-11-24 DIAGNOSIS — I25118 Atherosclerotic heart disease of native coronary artery with other forms of angina pectoris: Secondary | ICD-10-CM | POA: Diagnosis not present

## 2021-11-24 NOTE — Progress Notes (Signed)
Follow-up Outpatient Visit Date: 11/24/2021  Primary Care Provider: Venita Lick, NP [place] [id number]  Chief Complaint: Follow-up coronary artery disease and chronic HFpEF  HPI:  Jesse Norton is a 75 y.o. male with history of coronary artery disease status post PCI to LCx and LAD, HFpEF, hypertension, Barrett's esophagus, MALT-lymphoma of colon, gout, BPH, and obesity, who presents for follow-up of coronary artery disease and HFpEF.  I last saw Jesse Norton in late September, at which time he was feeling well and trying to lose weight through diet and exercise.  He reported "breathing heavy" at times with strenuous exercise though he did not feel like this was out of proportion to his level of activity.  Blood pressure was elevated at 164/84, though home readings were typically better.  We agreed to defer medication changes and to redouble efforts on lifestyle modifications.  Today, Jesse Norton reports that he has been doing quite well.  He happily reports the recent birth of his second grandchild.  He denies chest pain, shortness of breath, palpitations, and lightheadedness.  Leg swelling is generally well controlled.  He has noticed some vision changes in the left eye and is scheduled for ophthalmologic evaluation next week.  Recent home blood pressures have been 135-143/74-84.  He notes that his blood pressures are typically better when he exercises regularly.  He has not needed any sublingual nitroglycerin.  --------------------------------------------------------------------------------------------------  Cardiovascular History & Procedures: Cardiovascular Problems: Coronary artery disease HFpEF   Risk Factors: Known CAD, hypertension, male gender, obesity, and age greater than 41   Cath/PCI: PCI (02/03/2019): Severe, heavily calcified mid LAD stenosis (80-90%), unchanged from diagnostic catheterization earlier this month. Widely patent LCx stent. Normal left  ventricular filling pressure. Successful orbital atherectomy and IVUS-guided PCI to the mid LAD using a Resolute Onyx 3.0 x 15 mm drug-eluting stent (postdilated to 3.6 mm) with 0% residual stenosis and TIMI-3 flow.   LHC/PCI (01/21/2019): Significant 2-vessel coronary artery disease, including 80-90% mid LAD stenosis with heavy calcification, as well as 70% proximal LCx stenosis. Normal left ventricular systolic function. Moderately elevated left ventricular filling pressure. Successful PCI to proximal LCx using Resolute Onyx 2.75 x 15 mm drug-eluting stent (postdilated to 3.1 mm proximally) with 0% residual stenosis and TIMI-3 flow.   CV Surgery: None   EP Procedures and Devices: None   Non-Invasive Evaluation(s): Cardiac CTA (12/23/2018): Multivessel CAD with hemodynamically significant disease involving the mid LAD and distal LCx. Exercise tolerance test (11/06/2018): Intermediate risk study with 1 mm horizontal ST depressions and frequent PVCs during stress.  Recent CV Pertinent Labs: Lab Results  Component Value Date   CHOL 142 09/15/2021   CHOL 124 02/12/2019   HDL 60 09/15/2021   LDLCALC 65 09/15/2021   TRIG 91 09/15/2021   TRIG 58 02/12/2019   CHOLHDL 1.9 08/25/2020   K 4.3 09/15/2021   K 4.0 01/06/2014   MG 1.5 (L) 09/15/2021   BUN 20 09/15/2021   BUN 22 (H) 01/06/2014   CREATININE 0.94 09/15/2021   CREATININE 1.13 01/06/2014    Past medical and surgical history were reviewed and updated in EPIC.  Current Meds  Medication Sig   allopurinol (ZYLOPRIM) 300 MG tablet Take 1 tablet (300 mg total) by mouth daily.   amLODipine (NORVASC) 10 MG tablet TAKE 1 TABLET(10 MG) BY MOUTH DAILY   atorvastatin (LIPITOR) 40 MG tablet Take 1 tablet (40 mg total) by mouth daily.   clopidogrel (PLAVIX) 75 MG tablet Take 1  tablet (75 mg total) by mouth daily with breakfast.   Coenzyme Q10 (CO Q 10 PO) Take 1 capsule by mouth daily.   furosemide (LASIX) 20 MG tablet Take 1 tablet (20  mg total) by mouth daily.   LORazepam (ATIVAN) 1 MG tablet Take 0.5-1 tablets (0.5-1 mg total) by mouth daily as needed for anxiety.   losartan (COZAAR) 100 MG tablet TAKE 1 TABLET(100 MG) BY MOUTH DAILY   metoprolol tartrate (LOPRESSOR) 25 MG tablet TAKE 1 TABLET(25 MG) BY MOUTH TWICE DAILY   nitroGLYCERIN (NITROSTAT) 0.4 MG SL tablet Place 1 tablet (0.4 mg total) under the tongue every 5 (five) minutes as needed for chest pain. Maximum of 3 doses.   omega-3 acid ethyl esters (LOVAZA) 1 g capsule Take by mouth daily.   OVER THE COUNTER MEDICATION AGI (agricultural greens) powder form by mouth once daily   pantoprazole (PROTONIX) 20 MG tablet Take 1 tablet (20 mg total) by mouth 2 (two) times daily.   sertraline (ZOLOFT) 50 MG tablet Take 1 tablet (50 mg total) by mouth daily.    Allergies: Patient has no known allergies.  Social History   Tobacco Use   Smoking status: Never   Smokeless tobacco: Never  Vaping Use   Vaping Use: Never used  Substance Use Topics   Alcohol use: Yes    Alcohol/week: 5.0 standard drinks    Types: 5 Standard drinks or equivalent per week    Comment: moderate   Drug use: No    Family History  Adopted: Yes  Problem Relation Age of Onset   Heart attack Mother 64   Pancreatic cancer Father        died in WWII - pt adopted   Hypertension Sister    Hyperlipidemia Sister    Hypertension Brother    Hyperlipidemia Brother    Prostate cancer Neg Hx    Kidney cancer Neg Hx    Bladder Cancer Neg Hx     Review of Systems: A 12-system review of systems was performed and was negative except as noted in the HPI.  --------------------------------------------------------------------------------------------------  Physical Exam: BP (!) 160/84 (BP Location: Left Arm, Patient Position: Sitting, Cuff Size: Large)    Pulse 85    Ht 5\' 9"  (1.753 m)    Wt 272 lb (123.4 kg)    SpO2 92%    BMI 40.17 kg/m  Repeat BP 136/88  General:  NAD. Neck: No JVD or  HJR. Lungs: Clear to auscultation bilaterally without wheezes or crackles. Heart: Regular rate and rhythm without murmurs, rubs, or gallops. Abdomen: Soft, nontender, nondistended. Extremities: Trace pretibial edema.   Lab Results  Component Value Date   WBC 7.0 09/15/2021   HGB 14.2 09/15/2021   HCT 42.1 09/15/2021   MCV 103 (H) 09/15/2021   PLT 295 09/15/2021    Lab Results  Component Value Date   NA 141 09/15/2021   K 4.3 09/15/2021   CL 98 09/15/2021   CO2 27 09/15/2021   BUN 20 09/15/2021   CREATININE 0.94 09/15/2021   GLUCOSE 102 (H) 09/15/2021   ALT 28 09/15/2021    Lab Results  Component Value Date   CHOL 142 09/15/2021   HDL 60 09/15/2021   LDLCALC 65 09/15/2021   TRIG 91 09/15/2021   CHOLHDL 1.9 08/25/2020    --------------------------------------------------------------------------------------------------  ASSESSMENT AND PLAN: Coronary artery disease with stable angina: Mr. Rafalski continues to do well without recurrent angina status post PCI's to the LAD and LCx, remaining on antianginal  therapy with amlodipine and metoprolol.  We will continue his current medications including indefinite clopidogrel for antiplatelet therapy.  Chronic HFpEF: Chronic trivial pretibial edema appears stable on exam today.  Jesse Norton can continue with furosemide 20 mg daily.  Hypertension: Blood pressure initially quite elevated in the office but better on recheck.  Home blood pressures have been just above or below 140/80.  We discussed medication changes but have agreed to defer this in favor of lifestyle modifications, including weight loss.  Hyperlipidemia: Most recent lipid panel in 09/2021 at goal.  Continue atorvastatin 40 mg daily.  Morbid obesity: BMI greater than 40.  Weight loss encouraged through diet and exercise.  Follow-up: Return to clinic in 6 months.  Nelva Bush, MD 11/24/2021 10:15 AM

## 2021-11-24 NOTE — Patient Instructions (Signed)
Medication Instructions:   Your physician recommends that you continue on your current medications as directed. Please refer to the Current Medication list given to you today.  *If you need a refill on your cardiac medications before your next appointment, please call your pharmacy*   Lab Work:  None ordered  Testing/Procedures:  None ordered   Follow-Up: At [facility], you and your health needs are our priority.  As part of our continuing mission to provide you with exceptional heart care, we have created designated Provider Care Teams.  These Care Teams include your primary Cardiologist (physician) and Advanced Practice Providers (APPs -  Physician Assistants and Nurse Practitioners) who all work together to provide you with the care you need, when you need it.  We recommend signing up for the patient portal called "MyChart".  Sign up information is provided on this After Visit Summary.  MyChart is used to connect with patients for Virtual Visits (Telemedicine).  Patients are able to view lab/test results, encounter notes, upcoming appointments, etc.  Non-urgent messages can be sent to your provider as well.   To learn more about what you can do with MyChart, go to [email or web address].    Your next appointment:   6 month(s)  The format for your next appointment:   In Person  Provider:   You may see Nelva Bush, MD or one of the following Advanced Practice Providers on your designated Care Team:   Murray Hodgkins, NP Christell Faith, PA-C Cadence Kathlen Mody, [place]

## 2021-11-30 DIAGNOSIS — H2513 Age-related nuclear cataract, bilateral: Secondary | ICD-10-CM | POA: Diagnosis not present

## 2021-12-11 DIAGNOSIS — Z9841 Cataract extraction status, right eye: Secondary | ICD-10-CM

## 2021-12-11 DIAGNOSIS — Z9842 Cataract extraction status, left eye: Secondary | ICD-10-CM

## 2021-12-11 HISTORY — DX: Cataract extraction status, left eye: Z98.41

## 2021-12-11 HISTORY — DX: Cataract extraction status, left eye: Z98.42

## 2021-12-21 ENCOUNTER — Ambulatory Visit: Payer: Medicare Other | Admitting: Nurse Practitioner

## 2022-01-18 DIAGNOSIS — H2513 Age-related nuclear cataract, bilateral: Secondary | ICD-10-CM | POA: Diagnosis not present

## 2022-01-19 DIAGNOSIS — L400 Psoriasis vulgaris: Secondary | ICD-10-CM | POA: Diagnosis not present

## 2022-01-20 ENCOUNTER — Ambulatory Visit: Payer: Medicare Other | Admitting: Internal Medicine

## 2022-01-20 ENCOUNTER — Other Ambulatory Visit: Payer: Medicare Other

## 2022-01-23 ENCOUNTER — Ambulatory Visit: Payer: Self-pay | Admitting: *Deleted

## 2022-01-23 NOTE — Telephone Encounter (Signed)
Reason for Disposition  [1] HIGH RISK for severe COVID complications (e.g., weak immune system, age > 48 years, obesity with BMI > 25, pregnant, chronic lung disease or other chronic medical condition) AND [2] COVID symptoms (e.g., cough, fever)  (Exceptions: Already seen by PCP and no new or worsening symptoms.)  Answer Assessment - Initial Assessment Questions 1. COVID-19 DIAGNOSIS: "Who made your COVID-19 diagnosis?" "Was it confirmed by a positive lab test or self-test?" If not diagnosed by a doctor (or NP/PA), ask "Are there lots of cases (community spread) where you live?" Note: See public health department website, if unsure.     Tested positive yesterday for Covid 2. COVID-19 EXPOSURE: "Was there any known exposure to COVID before the symptoms began?" CDC Definition of close contact: within 6 feet (2 meters) for a total of 15 minutes or more over a 24-hour period.      Wife positive 3. ONSET: "When did the COVID-19 symptoms start?"      Friday Congestion, headache, congestion, body aches, chills, chest congestion and a dry cough.    4. WORST SYMPTOM: "What is your worst symptom?" (e.g., cough, fever, shortness of breath, muscle aches)     *No Answer* 5. COUGH: "Do you have a cough?" If Yes, ask: "How bad is the cough?"       Cough yes a dry cough.   Runny nose. 6. FEVER: "Do you have a fever?" If Yes, ask: "What is your temperature, how was it measured, and when did it start?"     I was but none now. 7. RESPIRATORY STATUS: "Describe your breathing?" (e.g., shortness of breath, wheezing, unable to speak)      *No Answer* 8. BETTER-SAME-WORSE: "Are you getting better, staying the same or getting worse compared to yesterday?"  If getting worse, ask, "In what way?"     *No Answer* 9. HIGH RISK DISEASE: "Do you have any chronic medical problems?" (e.g., asthma, heart or lung disease, weak immune system, obesity, etc.)     *No Answer* 10. VACCINE: "Have you had the COVID-19 vaccine?" If Yes,  ask: "Which one, how many shots, when did you get it?"       *No Answer* 11. BOOSTER: "Have you received your COVID-19 booster?" If Yes, ask: "Which one and when did you get it?"       *No Answer* 12. PREGNANCY: "Is there any chance you are pregnant?" "When was your last menstrual period?"       *No Answer* 13. OTHER SYMPTOMS: "Do you have any other symptoms?"  (e.g., chills, fatigue, headache, loss of smell or taste, muscle pain, sore throat)       *No Answer* 14. O2 SATURATION MONITOR:  "Do you use an oxygen saturation monitor (pulse oximeter) at home?" If Yes, ask "What is your reading (oxygen level) today?" "What is your usual oxygen saturation reading?" (e.g., 95%)       *No Answer*  Protocols used: Coronavirus (COVID-19) Diagnosed or Suspected-A-AH  Chief Complaint: Positive Covid Symptoms: head and chest congestion, dry cough, body aches, chills  (wife is positive already being treated by her dr) Frequency: Since Friday symptoms started Pertinent Negatives: Patient denies fever or shortness of breath Disposition: [] ED /[] Urgent Care (no appt availability in office) / [] Appointment(In office/virtual)/ []  [place] [facility]/ [x] Home Care/ [] Refused Recommended Disposition /[] [place] [facility]/ [x]  Follow-up with PCP Additional Notes: See triage notes and advise pt.   He is requesting to be seen virtually or in office today if  possible to get started on an antiviral.   Number in chart is correct.

## 2022-01-23 NOTE — Telephone Encounter (Signed)
Called and scheduled the patient a virtual visit for in the morning.

## 2022-01-23 NOTE — Telephone Encounter (Signed)
Summary: Covid positive needing medication   Patient called in to inform Jolene Cannady that on Friday evening he started symptoms of  heavy congestion, aches and pain, dry pain lot of muscle aches hot and cold chills. Got up and tested for Covid and it was positive along with his wife who is also positive. Patient is requesting medication please advise and call  Ph# [phone]     Attempted to call patient- left message to call office

## 2022-01-24 ENCOUNTER — Telehealth (INDEPENDENT_AMBULATORY_CARE_PROVIDER_SITE_OTHER): Payer: Medicare Other | Admitting: Unknown Physician Specialty

## 2022-01-24 ENCOUNTER — Encounter: Payer: Self-pay | Admitting: Unknown Physician Specialty

## 2022-01-24 DIAGNOSIS — U071 COVID-19: Secondary | ICD-10-CM

## 2022-01-24 MED ORDER — NIRMATRELVIR/RITONAVIR (PAXLOVID)TABLET: 3.0000 | ORAL_TABLET | Freq: Two times a day (BID) | ORAL | 0 refills | Status: AC

## 2022-01-24 NOTE — Progress Notes (Signed)
There were no vitals taken for this visit.   Subjective:    Patient ID: Jesse Norton, male    DOB: [birth year], 76 y.o.   MRN: [id number]  HPI: Jesse Norton is a 76 y.o. male  Chief Complaint  Patient presents with   Covid Positive    Tested positive on Sunday. Symptoms, Cough, congestion, muscle aches,body  aches, hot and cold. Has been taking Robitussin and Tylenol   Due to the catastrophic nature of the COVID-19 pandemic, this visit was completed via audio and visual contact via  Caregility  due to the restrictions of the COVID-19 pandemic. All issues as above were discussed and addressed. Physical exam was done as above through visual confirmation on  [employer] . If it was felt that the patient should be evaluated in the office, they were directed there. The patient verbally consented to this visit."} Location of the patient: home Location of the provider: home Those involved with this call:  Provider: Kathrine Haddock, DNP C  Time spent on call:  15 minutes on the phone discussing health concerns. 10 minutes total spent in review of patient's record and preparation of their chart. I verified patient identity using two factors (patient name and date of birth). Patient consents verbally to being seen via telemedicine visit today.  Has had 3 covid vaccines.  Positive for Covid on home testing  URI  This is a new problem. Episode onset: 5 DAYS AGO. The problem has been unchanged. The maximum temperature recorded prior to his arrival was 100.4 - 100.9 F. The fever has been present for 3 to 4 days. Associated symptoms include congestion, coughing, headaches and sinus pain. Pertinent negatives include no nausea or vomiting. Associated symptoms comments: Dry cough Severe muscle aches.   Relevant past medical, surgical, family and social history reviewed and updated as indicated. Interim medical history since our last visit reviewed. Allergies and medications reviewed and  updated.  Review of Systems  HENT:  Positive for congestion and sinus pain.   Respiratory:  Positive for cough.   Gastrointestinal:  Negative for nausea and vomiting.  Neurological:  Positive for headaches.   Per HPI unless specifically indicated above     Objective:    There were no vitals taken for this visit.  Wt Readings from Last 3 Encounters:  11/24/21 272 lb (123.4 kg)  09/15/21 274 lb (124.3 kg)  08/31/21 275 lb (124.7 kg)    Physical Exam Constitutional:      General: He is not in acute distress.    Appearance: Normal appearance. He is well-developed.  HENT:     Head: Normocephalic and atraumatic.  Eyes:     General: Lids are normal. No scleral icterus.       Right eye: No discharge.        Left eye: No discharge.     Conjunctiva/sclera: Conjunctivae normal.  Pulmonary:     Effort: Pulmonary effort is normal.  Abdominal:     Palpations: There is no hepatomegaly or splenomegaly.  Musculoskeletal:        General: Normal range of motion.  Skin:    Coloration: Skin is not pale.     Findings: No rash.  Neurological:     Mental Status: He is alert and oriented to person, place, and time.  Psychiatric:        Behavior: Behavior normal.        Thought Content: Thought content normal.        Judgment:  Judgment normal.    Results for orders placed or performed in visit on 09/15/21  Bayer DCA Hb A1c Waived  Result Value Ref Range   HB A1C (BAYER DCA - WAIVED) 5.9 (H) 4.8 - 5.6 %  CBC with Differential/Platelet  Result Value Ref Range   WBC 7.0 3.4 - 10.8 x10E3/uL   RBC 4.10 (L) 4.14 - 5.80 x10E6/uL   Hemoglobin 14.2 13.0 - 17.7 g/dL   Hematocrit 42.1 37.5 - 51.0 %   MCV 103 (H) 79 - 97 fL   MCH 34.6 (H) 26.6 - 33.0 pg   MCHC 33.7 31.5 - 35.7 g/dL   RDW 12.4 11.6 - 15.4 %   Platelets 295 150 - 450 x10E3/uL   Neutrophils 52 Not Estab. %   Lymphs 30 Not Estab. %   Monocytes 13 Not Estab. %   Eos 3 Not Estab. %   Basos 2 Not Estab. %   Neutrophils Absolute  3.6 1.4 - 7.0 x10E3/uL   Lymphocytes Absolute 2.1 0.7 - 3.1 x10E3/uL   Monocytes Absolute 0.9 0.1 - 0.9 x10E3/uL   EOS (ABSOLUTE) 0.2 0.0 - 0.4 x10E3/uL   Basophils Absolute 0.1 0.0 - 0.2 x10E3/uL   Immature Granulocytes 0 Not Estab. %   Immature Grans (Abs) 0.0 0.0 - 0.1 x10E3/uL  Comprehensive metabolic panel  Result Value Ref Range   Glucose 102 (H) 70 - 99 mg/dL   BUN 20 8 - 27 mg/dL   Creatinine, Ser 0.94 0.76 - 1.27 mg/dL   eGFR 84 >59 mL/min/1.73   BUN/Creatinine Ratio 21 10 - 24   Sodium 141 134 - 144 mmol/L   Potassium 4.3 3.5 - 5.2 mmol/L   Chloride 98 96 - 106 mmol/L   CO2 27 20 - 29 mmol/L   Calcium 9.8 8.6 - 10.2 mg/dL   Total Protein 7.0 6.0 - 8.5 g/dL   Albumin 4.6 3.7 - 4.7 g/dL   Globulin, Total 2.4 1.5 - 4.5 g/dL   Albumin/Globulin Ratio 1.9 1.2 - 2.2   Bilirubin Total 1.0 0.0 - 1.2 mg/dL   Alkaline Phosphatase 84 44 - 121 IU/L   AST 38 0 - 40 IU/L   ALT 28 0 - 44 IU/L  PSA  Result Value Ref Range   Prostate Specific Ag, Serum 1.9 0.0 - 4.0 ng/mL  TSH  Result Value Ref Range   TSH 1.740 0.450 - 4.500 uIU/mL  Lipid Panel w/o Chol/HDL Ratio  Result Value Ref Range   Cholesterol, Total 142 100 - 199 mg/dL   Triglycerides 91 0 - 149 mg/dL   HDL 60 >39 mg/dL   VLDL Cholesterol Cal 17 5 - 40 mg/dL   LDL Chol Calc (NIH) 65 0 - 99 mg/dL  Magnesium  Result Value Ref Range   Magnesium 1.5 (L) 1.6 - 2.3 mg/dL  Uric acid  Result Value Ref Range   Uric Acid 4.1 3.8 - 8.4 mg/dL  Microalbumin, Urine Waived  Result Value Ref Range   Microalb, Ur Waived 80 (H) 0 - 19 mg/L   Creatinine, Urine Waived 300 10 - 300 mg/dL   Microalb/Creat Ratio 30-300 (H) <30 mg/g      Assessment & Plan:   Problem List Items Addressed This Visit   None Visit Diagnoses     COVID-19    -  Primary   No signs suggesting bacterial infection.  Paxlovid prescribed with instructions to hold Atorvastatin and cut Amlodipine in 1/2. Supportive care with OTC meds   Relevant Medications  nirmatrelvir/ritonavir EUA (PAXLOVID) 20 x 150 MG & 10 x 100MG TABS       Follow up plan: Return if symptoms worsen or fail to improve.

## 2022-01-24 NOTE — Patient Instructions (Signed)
You are being prescribed PAXLOVID for COVID-19 infection.    Medications to hold while taking this treatment: Atorvastatin and take 1/2 of Amlodipine  *If asked to hold, you can resume them 24 hours after your last dose   ADMINISTRATION INSTRUCTIONS: Take with or without food. Swallow the tablets whole. Don't chew, crush, or break the medications because it might not work as well  For each dose of the medication, you should be taking 3 tablets together (2 pink oval and 1 white oval) TWICE a day for FIVE days   Finish your full five-day course of Paxlovid even if you feel better before you're done. Stopping this medication too early can make it less effective to prevent severe illness related to [place].    Paxlovid is prescribed for YOU ONLY. Don't share it with others, even if they have similar symptoms as you. This medication might not be right for everyone.  Make sure to take steps to protect yourself and others while you're taking this medication in order to get well soon and to prevent others from getting sick with COVID-19.  Paxlovid (nirmatrelvir / ritonavir) can cause hormonal birth control medications to not work well. If you or your partner is currently taking hormonal birth control, use condoms or other birth control methods to prevent unintended pregnancies.    COMMON SIDE EFFECTS: Altered or bad taste in your mouth  Diarrhea  High blood pressure (1% of people) Muscle aches (1% of people)     If your COVID-19 symptoms get worse, get medical help right away. Call 911 if you experience symptoms such as worsening cough, trouble breathing, chest pain that doesn't go away, confusion, a hard time staying awake, and pale or blue-colored skin. This medication won't prevent all COVID-19 cases from getting worse.

## 2022-02-03 ENCOUNTER — Other Ambulatory Visit: Payer: Self-pay | Admitting: Nurse Practitioner

## 2022-02-03 NOTE — Telephone Encounter (Signed)
Requested medications are due for refill today.  no  Requested medications are on the active medications list.  yes  Last refill. 09/15/2021 #90 with 4 refills  Future visit scheduled.   yes  Notes to clinic.  Medication not delegated. Pt should have an ample supply.    Requested Prescriptions  Pending Prescriptions Disp Refills   sertraline (ZOLOFT) 50 MG tablet [Pharmacy Med Name: SERTRALINE 50MG  TABLETS] 90 tablet 4    Sig: TAKE 1 TABLET(50 MG) BY MOUTH DAILY     Not Delegated - Psychiatry:  Antidepressants - SSRI - sertraline Failed - 02/03/2022 12:58 PM      Failed - This refill cannot be delegated      Passed - AST in normal range and within 360 days    AST  Date Value Ref Range Status  09/15/2021 38 0 - 40 IU/L Final   AST (SGOT) Piccolo, Waived  Date Value Ref Range Status  02/12/2019 38 11 - 38 U/L Final          Passed - ALT in normal range and within 360 days    ALT  Date Value Ref Range Status  09/15/2021 28 0 - 44 IU/L Final   ALT (SGPT) Piccolo, Waived  Date Value Ref Range Status  02/12/2019 30 10 - 47 U/L Final          Passed - Completed PHQ-2 or PHQ-9 in the last 360 days      Passed - Valid encounter within last 6 months    Recent Outpatient Visits           1 week ago [place], [place], NP   4 months ago Chronic heart failure with preserved ejection fraction ([place])   [place], Henrine Screws T, NP   8 months ago Morbid obesity ([place])   [place], Henrine Screws T, NP   9 months ago [place], [place] T, NP   10 months ago 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

## 2022-02-20 DIAGNOSIS — H2512 Age-related nuclear cataract, left eye: Secondary | ICD-10-CM | POA: Diagnosis not present

## 2022-02-27 ENCOUNTER — Encounter: Payer: Self-pay | Admitting: Ophthalmology

## 2022-02-28 ENCOUNTER — Telehealth: Payer: Self-pay | Admitting: Internal Medicine

## 2022-02-28 DIAGNOSIS — Z7902 Long term (current) use of antithrombotics/antiplatelets: Secondary | ICD-10-CM | POA: Diagnosis not present

## 2022-02-28 DIAGNOSIS — K2270 Barrett's esophagus without dysplasia: Secondary | ICD-10-CM | POA: Diagnosis not present

## 2022-02-28 DIAGNOSIS — Z86010 Personal history of colonic polyps: Secondary | ICD-10-CM | POA: Diagnosis not present

## 2022-02-28 DIAGNOSIS — K219 Gastro-esophageal reflux disease without esophagitis: Secondary | ICD-10-CM | POA: Diagnosis not present

## 2022-02-28 NOTE — Telephone Encounter (Signed)
? ?  Pre-operative Risk Assessment  ?  ?Patient Name: Jesse Norton  ?DOB: 02/16/1946 ?MRN: [id number]  ? ?  ? ?Request for Surgical Clearance   ? ?Procedure:   Colon / EGD ? ?Date of Surgery:  Clearance 06/19/22                              ?   ?Surgeon:  not indicated  ?Surgeon's Group or Practice Name:  [facility]  ?Phone number:  [phone] ?Fax number:  [phone] ?  ?Type of Clearance Requested:   ?- Pharmacy:  Hold Clopidogrel (Plavix) instructions  ?  ?Type of Anesthesia:  Not Indicated ?  ?Additional requests/questions:   ? ?Signed, ?Caryl Pina Gerringer   ?02/28/2022, 1:34 PM  ? ?

## 2022-03-01 NOTE — Telephone Encounter (Signed)
I think it is reasonable for clopidogrel to be held 5-7 days before endoscopies and restarted when felt safe to do so by GI.  I recommend that Mr. Penrose take aspirin 81 mg daily while he is off clopidogrel. ? ?Nelva Bush, MD ?[facility] ? ?

## 2022-03-01 NOTE — Telephone Encounter (Signed)
Jesse Norton 76 year old male is requesting preoperative cardiac evaluation for colonoscopy/EGD.  He was last seen in the clinic on 11/24/2021.  During that time he continued to do well from a cardiac standpoint.  Follow-up was planned for 6 months. ? ?His PMH includes  coronary artery disease status post PCI to LCx and LAD, HFpEF, hypertension, Barrett's esophagus, MALT-lymphoma of colon, gout, BPH, and obesity, who presents for follow-up of coronary artery disease and HFpEF.  I last saw [name] in late September, at which time he was feeling well and trying to lose weight through diet and exercise.  He reported "breathing heavy" at times with strenuous exercise though he did not feel like this was out of proportion to his level of activity.  Blood pressure was elevated at 164/84, though home readings were typically better.  We agreed to defer medication changes and to redouble efforts on lifestyle modifications. ? ?May his  Plavix be held prior to his procedure? ? ?Thank you for your help.  Please direct your response to [facility] preop pool. ? ? ?Jossie Ng. [name] NP-C ? ?  ?03/01/2022, 9:46 AM ?[place] ?[address] ?Office [phone] Fax [phone] ? ?

## 2022-03-02 NOTE — Telephone Encounter (Signed)
Preoperative team, please contact this patient and set up a phone call appointment for further cardiac evaluation.  Thank you for your help. ? ?Jossie Ng. Aeric Burnham NP-C ? ?  ?03/02/2022, 8:26 AM ?[place] ?[address] ?Office [phone] Fax [phone] ? ?

## 2022-03-02 NOTE — Discharge Instructions (Signed)

## 2022-03-02 NOTE — Telephone Encounter (Signed)
Pt has appt with Dr. Saunders Revel 06/07/22. Will add needs pre op clearance to appt notes. Will send FYI to requesting office the pt has appt 06/07/22 with Dr. Saunders Revel.  ?

## 2022-03-07 ENCOUNTER — Ambulatory Visit: Payer: Medicare Other | Admitting: Anesthesiology

## 2022-03-07 ENCOUNTER — Other Ambulatory Visit: Payer: Self-pay

## 2022-03-07 ENCOUNTER — Encounter
Admission: RE | Disposition: A | Discharge status: Home / Self Care | Payer: Self-pay | Source: Home / Self Care | Attending: Ophthalmology

## 2022-03-07 ENCOUNTER — Encounter: Payer: Self-pay | Admitting: Ophthalmology

## 2022-03-07 ENCOUNTER — Ambulatory Visit
Admission: RE | Admit: 2022-03-07 | Discharge: 2022-03-07 | Disposition: A | Discharge status: Home / Self Care | Payer: Medicare Other | Attending: Ophthalmology | Admitting: Ophthalmology

## 2022-03-07 DIAGNOSIS — H25812 Combined forms of age-related cataract, left eye: Secondary | ICD-10-CM | POA: Diagnosis not present

## 2022-03-07 DIAGNOSIS — I5032 Chronic diastolic (congestive) heart failure: Secondary | ICD-10-CM | POA: Insufficient documentation

## 2022-03-07 DIAGNOSIS — H2512 Age-related nuclear cataract, left eye: Secondary | ICD-10-CM | POA: Insufficient documentation

## 2022-03-07 DIAGNOSIS — F418 Other specified anxiety disorders: Secondary | ICD-10-CM | POA: Diagnosis not present

## 2022-03-07 DIAGNOSIS — K219 Gastro-esophageal reflux disease without esophagitis: Secondary | ICD-10-CM | POA: Diagnosis not present

## 2022-03-07 DIAGNOSIS — I110 Hypertensive heart disease with heart failure: Secondary | ICD-10-CM | POA: Diagnosis not present

## 2022-03-07 DIAGNOSIS — I2510 Atherosclerotic heart disease of native coronary artery without angina pectoris: Secondary | ICD-10-CM | POA: Diagnosis not present

## 2022-03-07 HISTORY — PX: CATARACT EXTRACTION W/PHACO: SHX586

## 2022-03-07 SURGERY — PHACOEMULSIFICATION, CATARACT, WITH IOL INSERTION
Anesthesia: Monitor Anesthesia Care | Site: Eye | Laterality: Left

## 2022-03-07 MED ORDER — ARMC OPHTHALMIC DILATING DROPS
1.0000 "application " | OPHTHALMIC | Status: DC | PRN
  Administered 2022-03-07 (×3): 1 via OPHTHALMIC

## 2022-03-07 MED ORDER — SIGHTPATH DOSE#1 BSS IO SOLN: INTRAOCULAR | Status: DC | PRN

## 2022-03-07 MED ORDER — SIGHTPATH DOSE#1 BSS IO SOLN
INTRAOCULAR | Status: DC | PRN
  Administered 2022-03-07: 43 mL via OPHTHALMIC

## 2022-03-07 MED ORDER — SIGHTPATH DOSE#1 NA CHONDROIT SULF-NA HYALURON 40-17 MG/ML IO SOLN
INTRAOCULAR | Status: DC | PRN
  Administered 2022-03-07: 1 mL via INTRAOCULAR

## 2022-03-07 MED ORDER — MOXIFLOXACIN HCL 0.5 % OP SOLN
OPHTHALMIC | Status: DC | PRN
  Administered 2022-03-07: 0.2 mL via OPHTHALMIC

## 2022-03-07 MED ORDER — LACTATED RINGERS IV SOLN: INTRAVENOUS | Status: DC

## 2022-03-07 MED ORDER — FENTANYL CITRATE (PF) 100 MCG/2ML IJ SOLN
INTRAMUSCULAR | Status: DC | PRN
Start: 2022-03-07 — End: 2022-03-07
  Administered 2022-03-07 (×2): 50 ug via INTRAVENOUS

## 2022-03-07 MED ORDER — BRIMONIDINE TARTRATE-TIMOLOL 0.2-0.5 % OP SOLN
OPHTHALMIC | Status: DC | PRN
  Administered 2022-03-07: 1 [drp] via OPHTHALMIC

## 2022-03-07 MED ORDER — ACETAMINOPHEN 325 MG PO TABS: 325.0000 mg | ORAL_TABLET | ORAL | Status: DC | PRN

## 2022-03-07 MED ORDER — MIDAZOLAM HCL 2 MG/2ML IJ SOLN
INTRAMUSCULAR | Status: DC | PRN
Start: 2022-03-07 — End: 2022-03-07
  Administered 2022-03-07 (×2): 1 mg via INTRAVENOUS

## 2022-03-07 MED ORDER — TETRACAINE HCL 0.5 % OP SOLN
1.0000 [drp] | OPHTHALMIC | Status: DC | PRN
  Administered 2022-03-07 (×3): 1 [drp] via OPHTHALMIC

## 2022-03-07 MED ORDER — SIGHTPATH DOSE#1 BSS IO SOLN
INTRAOCULAR | Status: DC | PRN
  Administered 2022-03-07: 15 mL

## 2022-03-07 MED ORDER — ACETAMINOPHEN 160 MG/5ML PO SOLN: 325.0000 mg | ORAL | Status: DC | PRN

## 2022-03-07 SURGICAL SUPPLY — 12 items
CATARACT SUITE SIGHTPATH (MISCELLANEOUS) ×2 IMPLANT
FEE CATARACT SUITE SIGHTPATH (MISCELLANEOUS) ×1 IMPLANT
GLOVE SURG ENC TEXT LTX SZ8 (GLOVE) ×2 IMPLANT
GLOVE SURG TRIUMPH 8.0 PF LTX (GLOVE) ×2 IMPLANT
LENS IOL ACRSF VT TRC 315 21.0 IMPLANT
LENS IOL ACRYSOF VIVITY 21.0 ×2 IMPLANT
LENS IOL VIVITY 315 21.0 ×1 IMPLANT
NDL FILTER BLUNT 18X1 1/2 (NEEDLE) ×1 IMPLANT
NEEDLE FILTER BLUNT 18X 1/2SAF (NEEDLE) ×1
NEEDLE FILTER BLUNT 18X1 1/2 (NEEDLE) ×1 IMPLANT
SYR 3ML LL SCALE MARK (SYRINGE) ×2 IMPLANT
WATER STERILE IRR 250ML POUR (IV SOLUTION) ×2 IMPLANT

## 2022-03-07 NOTE — Op Note (Signed)
PREOPERATIVE DIAGNOSIS:  Nuclear sclerotic cataract of the left eye. ?  ?POSTOPERATIVE DIAGNOSIS:  Nuclear sclerotic cataract of the left eye. ?  ?OPERATIVE PROCEDURE: Procedure(s): ?CATARACT EXTRACTION PHACO AND INTRAOCULAR LENS PLACEMENT (IOC) LEFT VIVITY TORIC LENS 10.41 00:55.7 ?  ?SURGEON:  Birder Robson, MD. ?  ?ANESTHESIA: ?1.      Managed anesthesia care. ?2.     0.37m os Shugarcaine was instilled following the paracentesis ?[email or web address]@ ?  ?COMPLICATIONS:  None. ?  ?TECHNIQUE:   Stop and chop  ?  ?DESCRIPTION OF PROCEDURE:  The patient was examined and consented in the preoperative holding area where the aforementioned topical anesthesia was applied to the left eye.  The patient was brought back to the Operating Room where he was sat upright on the gurney and given a target to fixate upon while the eye was marked at the 3:00 and 9:00 position.  The patient was then reclined on the operating table.  The eye was prepped and draped in the usual sterile ophthalmic fashion and a lid speculum was placed. A paracentesis was created with the side port blade and the anterior chamber was filled with viscoelastic. A near clear corneal incision was performed with the steel keratome. A continuous curvilinear capsulorrhexis was performed with a cystotome followed by the capsulorrhexis forceps. Hydrodissection and hydrodelineation were carried out with BSS on a blunt cannula. The lens was removed in a stop and chop technique and the remaining cortical material was removed with the irrigation-aspiration handpiece. The eye was inflated with viscoelastic and the ZCT lens was placed in the eye and rotated to within a few degrees of the predetermined orientation.  The remaining viscoelastic was removed from the eye.  The S[place]key hook was used to rotate the toric lens into its final resting place at 149 ? degrees.  0.1 ml of Vigamox was placed in the anterior chamber. The eye was inflated to a physiologic pressure and found  to be watertight.  The eye was dressed with Vigamox.and Combigan The patient was given protective glasses to wear throughout the day and a shield with which to sleep tonight. The patient was also given drops with which to begin a drop regimen today and will follow-up with me in one day. ?Implant Name Type Inv. Item Serial No. Manufacturer Lot No. LRB No. Used Action  ?LENS IOL ACRYSOF VIVITY 21.0 - [id number] LENS IOL ACRYSOF VIVITY 21.0 [id number]  Left 1 Implanted  ? ?Procedure(s): ?CATARACT EXTRACTION PHACO AND INTRAOCULAR LENS PLACEMENT (IOC) LEFT VIVITY TORIC LENS 10.41 00:55.7 (Left) ? ?Electronically signed: WBirder Robson3/28/20238:46 AM   ?

## 2022-03-07 NOTE — Anesthesia Procedure Notes (Signed)
Procedure Name: [place] ?Date/Time: 03/07/2022 8:29 AM ?Performed by: Cameron Ali, CRNA ?Pre-anesthesia Checklist: Patient identified, Emergency Drugs available, Suction available, Timeout performed and Patient being monitored ?Patient Re-evaluated:Patient Re-evaluated prior to induction ?Oxygen Delivery Method: Nasal cannula ?Placement Confirmation: positive ETCO2 ? ? ? ? ?

## 2022-03-07 NOTE — Transfer of Care (Signed)
Immediate Anesthesia Transfer of Care Note ? ?Patient: Jesse Norton ? ?Procedure(s) Performed: CATARACT EXTRACTION PHACO AND INTRAOCULAR LENS PLACEMENT (IOC) LEFT VIVITY TORIC LENS 10.41 00:55.7 (Left: Eye) ? ?Patient Location: PACU ? ?Anesthesia Type: MAC ? ?Level of Consciousness: awake, alert  and patient cooperative ? ?Airway and Oxygen Therapy: Patient Spontanous Breathing and Patient connected to supplemental oxygen ? ?Post-op Assessment: Post-op Vital signs reviewed, Patient's Cardiovascular Status Stable, Respiratory Function Stable, Patent Airway and No signs of Nausea or vomiting ? ?Post-op Vital Signs: Reviewed and stable ? ?Complications: No notable events documented. ? ?

## 2022-03-07 NOTE — Anesthesia Postprocedure Evaluation (Signed)
Anesthesia Post Note ? ?Patient: Jesse Norton ? ?Procedure(s) Performed: CATARACT EXTRACTION PHACO AND INTRAOCULAR LENS PLACEMENT (IOC) LEFT VIVITY TORIC LENS 10.41 00:55.7 (Left: Eye) ? ? ?  ?Patient location during evaluation: PACU ?Anesthesia Type: MAC ?Level of consciousness: awake and alert ?Pain management: pain level controlled ?Vital Signs Assessment: post-procedure vital signs reviewed and stable ?Respiratory status: spontaneous breathing, nonlabored ventilation, respiratory function stable and patient connected to nasal cannula oxygen ?Cardiovascular status: stable and blood pressure returned to baseline ?Postop Assessment: no apparent nausea or vomiting ?Anesthetic complications: no ? ? ?No notable events documented. ? ?Trecia Rogers ? ? ? ? ? ?

## 2022-03-07 NOTE — Anesthesia Preprocedure Evaluation (Signed)
Anesthesia Evaluation  ?Patient identified by MRN, date of birth, ID band ?Patient awake ? ? ? ?Reviewed: ?Allergy & Precautions, H&P , NPO status , Patient's Chart, lab work & pertinent test results, reviewed documented beta blocker date and time  ? ?Airway ?Mallampati: II ? ?TM Distance: >3 FB ?Neck ROM: full ? ? ? Dental ?no notable dental hx. ? ?  ?Pulmonary ?neg pulmonary ROS,  ?  ?Pulmonary exam normal ?breath sounds clear to auscultation ? ? ? ? ? ? Cardiovascular ?Exercise Tolerance: Good ?hypertension, + CAD and + Peripheral Vascular Disease  ?Normal cardiovascular exam ?Rhythm:regular Rate:Normal ? ?HFpEF 55% ?  ?Neuro/Psych ?Anxiety Depression negative neurological ROS ? negative psych ROS  ? GI/Hepatic ?Neg liver ROS, hiatal hernia, PUD, GERD  ,Barrett's esophagus ?  ?Endo/Other  ?negative endocrine ROS ? Renal/GU ?negative Renal ROS  ?negative genitourinary ?  ?Musculoskeletal ? ? Abdominal ?  ?Peds ? Hematology ?negative hematology ROS ?(+)   ?Anesthesia Other Findings ? ? Reproductive/Obstetrics ?negative OB ROS ? ?  ? ? ? ? ? ? ? ? ? ? ? ? ? ?  ?  ? ? ? ? ? ? ? ? ?Anesthesia Physical ?Anesthesia Plan ? ?ASA: 3 ? ?Anesthesia Plan: MAC  ? ?Post-op Pain Management:   ? ?Induction:  ? ?PONV Risk Score and Plan:  ? ?Airway Management Planned:  ? ?Additional Equipment:  ? ?Intra-op Plan:  ? ?Post-operative Plan:  ? ?Informed Consent: I have reviewed the patients History and Physical, chart, labs and discussed the procedure including the risks, benefits and alternatives for the proposed anesthesia with the patient or authorized representative who has indicated his/her understanding and acceptance.  ? ? ? ?Dental Advisory Given ? ?Plan Discussed with: CRNA and Anesthesiologist ? ?Anesthesia Plan Comments:   ? ? ? ? ? ? ?Anesthesia Quick Evaluation ? ?

## 2022-03-07 NOTE — H&P (Signed)
[place]  ? ?Primary Care Physician:  Venita Lick, NP ?Ophthalmologist: Dr.Saylah Ketner ? ?Pre-Procedure History & Physical: ?HPI:  Jesse Norton is a 76 y.o. male here for cataract surgery. ?  ?Past Medical History:  ?Diagnosis Date  ? (HFpEF) heart failure with preserved ejection fraction ([place])   ? a. 01/2019 Cath: EF 55-60% w/ elevated LVEDP.  ? Anxiety   ? Barrett's esophagus   ? CAD (coronary artery disease)   ? a. 10/2018 ETT: 43m horizontal inflat ST depression, freq PVC's->Intermediate; b. 12/2018 Cardiac CTA: LM nl, LAD FFRct 0.6 in mLAD, LCX FFRct 0.99p, 0.872m0.77d, RCA no signif dzs, FFRct 0.89d->Rec cath; c. 01/2019 PCI: LM nl, LAD 40p, 8574m2 70, LCX 70p (2.75x15 Resolute Onyx DES), RCA min irregs. EF 55-65%. EDP 25-31m36m d. 01/2019 PCI: LAD 80-90 (atherectomy & 3x15 Resolute Onyx DES).  ? Colon polyps   ? Duodenal adenoma   ? Gastritis   ? GERD (gastroesophageal reflux disease)   ? Gout   ? H. pylori infection   ? H/O urethral stricture   ? Hemorrhoids   ? Hiatal hernia   ? Hypertension   ? Stomach ulcer   ? ? ?Past Surgical History:  ?Procedure Laterality Date  ? CARDIAC CATHETERIZATION    ? COLONOSCOPY    ? COLONOSCOPY WITH PROPOFOL N/A 08/20/2018  ? Procedure: COLONOSCOPY WITH PROPOFOL;  Surgeon: SkulLollie Sails;  [facility];  Service: Endoscopy;  Laterality: N/A;  ? COLONOSCOPY WITH PROPOFOL N/A 12/19/2018  ? Procedure: COLONOSCOPY WITH PROPOFOL;  Surgeon: SkulLollie Sails;  [facility];  Service: Endoscopy;  Laterality: N/A;  ? CORONARY ATHERECTOMY N/A 02/03/2019  ? Procedure: CORONARY ATHERECTOMY;  Surgeon: End,Nelva Bush;  [facility];  Service: Cardiovascular;  Laterality: N/A;  ? CORONARY STENT INTERVENTION N/A 01/21/2019  ? Procedure: CORONARY STENT INTERVENTION;  Surgeon: End,Nelva Bush;  [facility];  Service: Cardiovascular;  Laterality: N/A;  ? CORONARY STENT INTERVENTION N/A  02/03/2019  ? Procedure: CORONARY STENT INTERVENTION;  Surgeon: End,Nelva Bush;  [facility];  Service: Cardiovascular;  Laterality: N/A;  ? CYSTOSCOPY WITH URETHRAL DILATATION N/A 08/16/2018  ? Procedure: CYSTOSCOPY WITH URETHRAL DILATATION;  Surgeon: SninBilley Co;  [facility];  Service: Urology;  Laterality: N/A;  ? ESOPHAGOGASTRODUODENOSCOPY N/A 01/29/2017  ? Procedure: ESOPHAGOGASTRODUODENOSCOPY (EGD);  Surgeon: MartLollie Sails;  [facility];  Service: Endoscopy;  Laterality: N/A;  ? ESOPHAGOGASTRODUODENOSCOPY (EGD) WITH PROPOFOL N/A 07/10/2016  ? Procedure: ESOPHAGOGASTRODUODENOSCOPY (EGD) WITH PROPOFOL;  Surgeon: MartLollie Sails;  [facility];  Service: Endoscopy;  Laterality: N/A;  ? ESOPHAGOGASTRODUODENOSCOPY (EGD) WITH PROPOFOL N/A 12/19/2018  ? Procedure: ESOPHAGOGASTRODUODENOSCOPY (EGD) WITH PROPOFOL;  Surgeon: SkulLollie Sails;  [facility];  Service: Endoscopy;  Laterality: N/A;  ? gastritis    ? h.pylori    ? INTRAVASCULAR ULTRASOUND/IVUS N/A 02/03/2019  ? Procedure: Intravascular Ultrasound/IVUS;  Surgeon: End,Nelva Bush;  [facility];  Service: Cardiovascular;  Laterality: N/A;  ? left elbow repair Left   ? LEFT HEART CATH AND CORONARY ANGIOGRAPHY Left 01/21/2019  ? Procedure: LEFT HEART CATH AND CORONARY ANGIOGRAPHY;  Surgeon: End,Nelva Bush;  [facility];  Service: Cardiovascular;  Laterality: Left;  ? SPLENECTOMY  1957  ? TONSILLECTOMY    ? ? ?Prior to Admission medications   ?Medication Sig Start Date End Date Taking? Authorizing Provider  ?  amLODipine (NORVASC) 10 MG tablet TAKE 1 TABLET(10 MG) BY MOUTH DAILY 09/09/21  Yes Cannady, Jolene T, NP  ?atorvastatin (LIPITOR) 40 MG tablet Take 1 tablet (40 mg total) by mouth daily. 09/15/21  Yes Cannady, Henrine Screws T, NP  ?clobetasol ointment (TEMOVATE) 0.05 % Apply topically. 01/20/22  Yes [provider]   ?clopidogrel (PLAVIX) 75 MG tablet Take 1 tablet (75 mg total) by mouth daily with breakfast. 09/15/21  Yes Venita Lick, NP  ?Coenzyme Q10 (CO Q 10 PO) Take 1 capsule by mouth daily.   Yes [provider]  ?furosemide (LASIX) 20 MG tablet Take 1 tablet (20 mg total) by mouth daily. 02/23/21  Yes End, Harrell Gave, MD  ?LORazepam (ATIVAN) 1 MG tablet Take 0.5-1 tablets (0.5-1 mg total) by mouth daily as needed for anxiety. 09/15/21  Yes Cannady, Jolene T, NP  ?losartan (COZAAR) 100 MG tablet TAKE 1 TABLET(100 MG) BY MOUTH DAILY 08/18/21  Yes Cannady, Jolene T, NP  ?metoprolol tartrate (LOPRESSOR) 25 MG tablet TAKE 1 TABLET(25 MG) BY MOUTH TWICE DAILY 08/18/21  Yes Cannady, Jolene T, NP  ?omega-3 acid ethyl esters (LOVAZA) 1 g capsule Take by mouth daily.   Yes [provider]  ?OVER THE COUNTER MEDICATION AGI (agricultural greens) powder form by mouth once daily   Yes [provider]  ?pantoprazole (PROTONIX) 20 MG tablet Take 1 tablet (20 mg total) by mouth 2 (two) times daily. 09/15/21  Yes Cannady, Henrine Screws T, NP  ?sertraline (ZOLOFT) 50 MG tablet Take 1 tablet (50 mg total) by mouth daily. ?Patient taking differently: Take 50 mg by mouth as needed. 09/15/21  Yes Cannady, Henrine Screws T, NP  ?allopurinol (ZYLOPRIM) 300 MG tablet Take 1 tablet (300 mg total) by mouth daily. ?Patient taking differently: Take 300 mg by mouth daily as needed. 03/15/21   Cannady, Henrine Screws T, NP  ?nitroGLYCERIN (NITROSTAT) 0.4 MG SL tablet Place 1 tablet (0.4 mg total) under the tongue every 5 (five) minutes as needed for chest pain. Maximum of 3 doses. 08/25/20   End, Harrell Gave, MD  ? ? ?Allergies as of 01/20/2022  ? (No Known Allergies)  ? ? ?Family History  ?Adopted: Yes  ?Problem Relation Age of Onset  ? Heart attack Mother 48  ? Pancreatic cancer Father   ?     died in WWII - pt adopted  ? Hypertension Sister   ? Hyperlipidemia Sister   ? Hypertension Brother   ? Hyperlipidemia Brother   ? Prostate cancer Neg Hx   ?  Kidney cancer Neg Hx   ? Bladder Cancer Neg Hx   ? ? ?Social History  ? ?Socioeconomic History  ? Marital status: Married  ?  Spouse name: Not on file  ? Number of children: Not on file  ? Years of education: Not on file  ? Highest education level: Master's degree (e.g., MA, MS, MEng, MEd, MSW, MBA)  ?Occupational History  ? Occupation: retired   ?Tobacco Use  ? Smoking status: Never  ? Smokeless tobacco: Never  ?Vaping Use  ? Vaping Use: Never used  ?Substance and Sexual Activity  ? Alcohol use: Yes  ?  Alcohol/week: 5.0 standard drinks  ?  Types: 5 Standard drinks or equivalent per week  ?  Comment: moderate  ? Drug use: No  ? Sexual activity: Yes  ?Other Topics Concern  ? Not on file  ?Social History Narrative  ? Rides bicycle with friends   ?   ? No smoking.  Occasional alcohol.  Lives at home.  Retired Customer service manager.  ? ?Social Determinants of Health  ? ?Financial Resource Strain: Low Risk   ? Difficulty of Paying Living Expenses: Not hard at all  ?Food Insecurity: No Food Insecurity  ? Worried About Charity fundraiser in the Last Year: Never true  ? Ran Out of Food in the Last Year: Never true  ?Transportation Needs: No Transportation Needs  ? Lack of Transportation (Medical): No  ? Lack of Transportation (Non-Medical): No  ?Physical Activity: Insufficiently Active  ? Days of Exercise per Week: 4 days  ? Minutes of Exercise per Session: 30 min  ?Stress: No Stress Concern Present  ? Feeling of Stress : Not at all  ?Social Connections: Not on file  ?Intimate Partner Violence: Not on file  ? ? ?Review of Systems: ?See HPI, otherwise negative ROS ? ?Physical Exam: ?BP (!) 169/87   Pulse 80   Temp 97.9 ?F (36.6 ?C) (Temporal)   Resp 16   Ht '5\' 11"'$  (1.803 m)   Wt 123.8 kg   SpO2 97%   BMI 38.08 kg/m?  ?General:   Alert, cooperative in NAD ?Head:  Normocephalic and atraumatic. ?Respiratory:  Normal work of breathing. ?Cardiovascular:  RRR ? ?Impression/Plan: ?MENG WINTERTON is here for cataract surgery. ? ?Risks,  benefits, limitations, and alternatives regarding cataract surgery have been reviewed with the patient.  Questions have been answered.  All parties agreeable. ? ? ?Birder Robson, MD  03/07/2022, 8:22 AM ? ? ?

## 2022-03-08 ENCOUNTER — Encounter: Payer: Self-pay | Admitting: Ophthalmology

## 2022-03-14 DIAGNOSIS — H2511 Age-related nuclear cataract, right eye: Secondary | ICD-10-CM | POA: Diagnosis not present

## 2022-03-18 NOTE — Patient Instructions (Addendum)
[place]  ? ?Managing Anxiety, Adult ?After being diagnosed with anxiety, you may be relieved to know why you have felt or behaved a certain way. You may also feel overwhelmed about the treatment ahead and what it will mean for your life. With care and support, you can manage this condition. ?How to manage lifestyle changes ?Managing stress and anxiety ?Stress is your body's reaction to life changes and events, both good and bad. Most stress will last just a few hours, but stress can be ongoing and can lead to more than just stress. Although stress can play a major role in anxiety, it is not the same as anxiety. Stress is usually caused by something external, such as a deadline, test, or competition. Stress normally passes after the triggering event has ended.  ?Anxiety is caused by something internal, such as imagining a terrible outcome or worrying that something will go wrong that will devastate you. Anxiety often does not go away even after the triggering event is over, and it can become long-term (chronic) worry. It is important to understand the differences between stress and anxiety and to manage your stress effectively so that it does not lead to an anxious response. ?Talk with your health care provider or a counselor to learn more about reducing anxiety and stress. He or she may suggest tension reduction techniques, such as: ?Music therapy. Spend time creating or listening to music that you enjoy and that inspires you. ?Mindfulness-based meditation. Practice being aware of your normal breaths while not trying to control your breathing. It can be done while sitting or walking. ?Centering prayer. This involves focusing on a word, phrase, or sacred image that means something to you and brings you peace. ?Deep breathing. To do this, expand your stomach and inhale slowly through your nose. Hold your breath for 3-5 seconds. Then exhale slowly, letting your stomach muscles relax. ?Self-talk. Learn to notice and  identify thought patterns that lead to anxiety reactions and change those patterns to thoughts that feel peaceful. ?Muscle relaxation. Taking time to tense muscles and then relax them. ?Choose a tension reduction technique that fits your lifestyle and personality. These techniques take time and practice. Set aside 5-15 minutes a day to do them. Therapists can offer counseling and training in these techniques. The training to help with anxiety may be covered by some insurance plans. ?Other things you can do to manage stress and anxiety include: ?Keeping a stress diary. This can help you learn what triggers your reaction and then learn ways to manage your response. ?Thinking about how you react to certain situations. You may not be able to control everything, but you can control your response. ?Making time for activities that help you relax and not feeling guilty about spending your time in this way. ?Doing visual imagery. This involves imagining or creating mental pictures to help you relax. ?Practicing yoga. Through yoga poses, you can lower tension and promote relaxation. ? ?Medicines ?Medicines can help ease symptoms. Medicines for anxiety include: ?Antidepressant medicines. These are usually prescribed for long-term daily control. ?Anti-anxiety medicines. These may be added in severe cases, especially when panic attacks occur. ?Medicines will be prescribed by a health care provider. When used together, medicines, psychotherapy, and tension reduction techniques may be the most effective treatment. ?Relationships ?Relationships can play a big part in helping you recover. Try to spend more time connecting with trusted friends and family members. ?Consider going to couples counseling if you have a partner, taking family education classes, or  going to family therapy. ?Therapy can help you and others better understand your condition. ?How to recognize changes in your anxiety ?Everyone responds differently to treatment  for anxiety. Recovery from anxiety happens when symptoms decrease and stop interfering with your daily activities at home or work. This may mean that you will start to: ?Have better concentration and focus. Worry will interfere less in your daily thinking. ?Sleep better. ?Be less irritable. ?Have more energy. ?Have improved memory. ?It is also important to recognize when your condition is getting worse. Contact your health care provider if your symptoms interfere with home or work and you feel like your condition is not improving. ?Follow these instructions at home: ?Activity ?Exercise. Adults should do the following: ?Exercise for at least 150 minutes each week. The exercise should increase your heart rate and make you sweat (moderate-intensity exercise). ?Strengthening exercises at least twice a week. ?Get the right amount and quality of sleep. Most adults need 7-9 hours of sleep each night. ?Lifestyle ? ?Eat a healthy diet that includes plenty of vegetables, fruits, whole grains, low-fat dairy products, and lean protein. ?Do not eat a lot of foods that are high in fats, added sugars, or salt (sodium). ?Make choices that simplify your life. ?Do not use any products that contain nicotine or tobacco. These products include cigarettes, chewing tobacco, and vaping devices, such as e-cigarettes. If you need help quitting, ask your health care provider. ?Avoid caffeine, alcohol, and certain over-the-counter cold medicines. These may make you feel worse. Ask your pharmacist which medicines to avoid. ?General instructions ?Take over-the-counter and prescription medicines only as told by your health care provider. ?Keep all follow-up visits. This is important. ?Where to find support ?You can get help and support from these sources: ?Self-help groups. ?Online and [employer]. ?A trusted spiritual leader. ?Couples counseling. ?Family education classes. ?Family therapy. ?Where to find more information ?You may  find that joining a support group helps you deal with your anxiety. The following sources can help you locate counselors or support groups near you: ?[place]: www.mentalhealthamerica.net ?Anxiety and Depression Association of America (ADAA): https://www.clark.net/ ?National Alliance on Mental Illness (NAMI): www.nami.org ?Contact a health care provider if: ?You have a hard time staying focused or finishing daily tasks. ?You spend many hours a day feeling worried about everyday life. ?You become exhausted by worry. ?You start to have headaches or frequently feel tense. ?You develop chronic nausea or diarrhea. ?Get help right away if: ?You have a racing heart and shortness of breath. ?You have thoughts of hurting yourself or others. ?If you ever feel like you may hurt yourself or others, or have thoughts about taking your own life, get help right away. Go to your nearest emergency department or: ?Call your local emergency services (911 in the [place] ?Call a suicide crisis helpline, such as the [place] at (609)473-3301 or 988 in the [place]. This is open 24 hours a day in the [place] ?Text the Crisis Text Line at (925)749-5644 (in the [place].). ?Summary ?Taking steps to learn and use tension reduction techniques can help calm you and help prevent triggering an anxiety reaction. ?When used together, medicines, psychotherapy, and tension reduction techniques may be the most effective treatment. ?Family, friends, and partners can play a big part in supporting you. ?This information is not intended to replace advice given to you by your health care provider. Make sure you discuss any questions you have with your health care provider. ?Document Revised: 06/22/2021 Document Reviewed:  03/20/2021 ?Elsevier Patient Education ? [place]. ? ?

## 2022-03-20 ENCOUNTER — Encounter: Payer: Self-pay | Admitting: Ophthalmology

## 2022-03-21 DIAGNOSIS — D2271 Melanocytic nevi of right lower limb, including hip: Secondary | ICD-10-CM | POA: Diagnosis not present

## 2022-03-21 DIAGNOSIS — L820 Inflamed seborrheic keratosis: Secondary | ICD-10-CM | POA: Diagnosis not present

## 2022-03-21 DIAGNOSIS — D225 Melanocytic nevi of trunk: Secondary | ICD-10-CM | POA: Diagnosis not present

## 2022-03-21 DIAGNOSIS — L538 Other specified erythematous conditions: Secondary | ICD-10-CM | POA: Diagnosis not present

## 2022-03-21 DIAGNOSIS — L821 Other seborrheic keratosis: Secondary | ICD-10-CM | POA: Diagnosis not present

## 2022-03-21 DIAGNOSIS — D2261 Melanocytic nevi of right upper limb, including shoulder: Secondary | ICD-10-CM | POA: Diagnosis not present

## 2022-03-21 DIAGNOSIS — L400 Psoriasis vulgaris: Secondary | ICD-10-CM | POA: Diagnosis not present

## 2022-03-21 DIAGNOSIS — D2272 Melanocytic nevi of left lower limb, including hip: Secondary | ICD-10-CM | POA: Diagnosis not present

## 2022-03-21 DIAGNOSIS — D2262 Melanocytic nevi of left upper limb, including shoulder: Secondary | ICD-10-CM | POA: Diagnosis not present

## 2022-03-22 ENCOUNTER — Telehealth: Payer: Self-pay

## 2022-03-22 ENCOUNTER — Encounter: Payer: Self-pay | Admitting: Nurse Practitioner

## 2022-03-22 ENCOUNTER — Ambulatory Visit (INDEPENDENT_AMBULATORY_CARE_PROVIDER_SITE_OTHER): Payer: Medicare Other | Admitting: Nurse Practitioner

## 2022-03-22 VITALS — BP 136/84 | HR 85 | Temp 97.9°F | Ht 71.0 in | Wt 274.4 lb

## 2022-03-22 DIAGNOSIS — F419 Anxiety disorder, unspecified: Secondary | ICD-10-CM

## 2022-03-22 DIAGNOSIS — I5032 Chronic diastolic (congestive) heart failure: Secondary | ICD-10-CM

## 2022-03-22 DIAGNOSIS — I10 Essential (primary) hypertension: Secondary | ICD-10-CM | POA: Diagnosis not present

## 2022-03-22 DIAGNOSIS — K22719 Barrett's esophagus with dysplasia, unspecified: Secondary | ICD-10-CM

## 2022-03-22 DIAGNOSIS — F339 Major depressive disorder, recurrent, unspecified: Secondary | ICD-10-CM | POA: Diagnosis not present

## 2022-03-22 DIAGNOSIS — E785 Hyperlipidemia, unspecified: Secondary | ICD-10-CM | POA: Diagnosis not present

## 2022-03-22 DIAGNOSIS — Z79899 Other long term (current) drug therapy: Secondary | ICD-10-CM | POA: Diagnosis not present

## 2022-03-22 DIAGNOSIS — D692 Other nonthrombocytopenic purpura: Secondary | ICD-10-CM | POA: Diagnosis not present

## 2022-03-22 DIAGNOSIS — C884 Extranodal marginal zone B-cell lymphoma of mucosa-associated lymphoid tissue [MALT-lymphoma]: Secondary | ICD-10-CM | POA: Diagnosis not present

## 2022-03-22 DIAGNOSIS — I25118 Atherosclerotic heart disease of native coronary artery with other forms of angina pectoris: Secondary | ICD-10-CM

## 2022-03-22 MED ORDER — WEGOVY 0.25 MG/0.5ML ~~LOC~~ SOAJ: 0.2500 mg | SUBCUTANEOUS | 2 refills | Status: DC

## 2022-03-22 MED ORDER — PANTOPRAZOLE SODIUM 20 MG PO TBEC: 20.0000 mg | DELAYED_RELEASE_TABLET | Freq: Two times a day (BID) | ORAL | 4 refills | Status: DC

## 2022-03-22 MED ORDER — SERTRALINE HCL 50 MG PO TABS: 50.0000 mg | ORAL_TABLET | Freq: Every day | ORAL | 4 refills | Status: DC

## 2022-03-22 MED ORDER — ALLOPURINOL 300 MG PO TABS: 300.0000 mg | ORAL_TABLET | ORAL | 4 refills | Status: DC

## 2022-03-22 MED ORDER — LORAZEPAM 1 MG PO TABS: 0.5000 mg | ORAL_TABLET | Freq: Every day | ORAL | 1 refills | Status: DC | PRN

## 2022-03-22 NOTE — Assessment & Plan Note (Signed)
Chronic, stable.  Denies SI/HI.  Continue current medication regimen and adjust as needed.  Recommend relaxation and meditation techniques at home.  Return in 6 months. 

## 2022-03-22 NOTE — Telephone Encounter (Signed)
Patient was denied for [facility] prescription via CoverMyMeds. ?

## 2022-03-22 NOTE — Assessment & Plan Note (Signed)
Recommend gentle skin care at home and monitor for abrasions or skin breakdown, alert provider if present. 

## 2022-03-22 NOTE — Assessment & Plan Note (Signed)
BMI 38.27 with underlying cardiac issues -- will attempt to get [facility] coverage which would offer benefit to this patient for weight loss and overall health.  Recommended eating smaller high protein, low fat meals more frequently and exercising 30 mins a day 5 times a week with a goal of 10-15lb weight loss in the next 3 months. Patient voiced their understanding and motivation to adhere to these recommendations. ? ?

## 2022-03-22 NOTE — Assessment & Plan Note (Signed)
Chronic, ongoing with mild elevation initial BP and improved recheck.  Continue current medication regimen and adjust as needed, sees cardiology will defer changes to them at upcoming visit. Labs: up to date.  He declines OSA testing.  Recommend he continue to monitor BP at home and document + focus on DASH diet and regular exercise.  Return in 6 months. ?

## 2022-03-22 NOTE — Telephone Encounter (Signed)
Prior authorization was initiated via CoverMyMeds for [facility].  ? ?KEY: [id number] ?

## 2022-03-22 NOTE — Assessment & Plan Note (Signed)
Discussed at length with patient and recommend continue to reduce use.  Refer to anxiety plan.  UDS due today and contract up to date. ?

## 2022-03-22 NOTE — Assessment & Plan Note (Signed)
Chronic, ongoing.  At length discussion on risks of long term benzo use, he wishes to continue treatment and reports using minimally.  Refills sent #30 pills with 1 refill.  This often lasts 6 months.  Return in 6 months.  UDS due today and contract up to date. ?

## 2022-03-22 NOTE — Assessment & Plan Note (Signed)
Chronic, stable with no recent exacerbations.  Euvolemic.  Continue current medication regimen and collaboration with cardiology.  Recommend: ?- Reminded to call for an overnight weight gain of >2 pounds or a weekly weight weight of >5 pounds ?- not adding salt to his food and has been reading food labels. Reviewed the importance of keeping daily sodium intake to '2000mg'$  daily  ?- Avoid Ibuprofen containing products ?

## 2022-03-22 NOTE — Assessment & Plan Note (Signed)
Chronic, stable.  No recent NTG use.  Continue current medication regimen and collaboration with cardiology.  To alert provider immediately if CP presents or go immediately to ER. 

## 2022-03-22 NOTE — Assessment & Plan Note (Signed)
Chronic, stable.  Refills sent per request. ?

## 2022-03-22 NOTE — Progress Notes (Signed)
? ?BP 136/84 (BP Location: Left Arm, Patient Position: Sitting, Cuff Size: Normal)   Pulse 85   Temp 97.9 ?F (36.6 ?C) (Oral)   Ht $R'5\' 11"'Nf$  (1.803 m)   Wt 274 lb 6.4 oz (124.5 kg)   SpO2 96%   BMI 38.27 kg/m?   ? ?Subjective:  ? ? Patient ID: Jesse Norton, male    DOB: 07/25/1946, 76 y.o.   MRN: [id number] ? ?HPI: ?Jesse Norton is a 76 y.o. male ? ?Chief Complaint  ?Patient presents with  ? Mood  ?  Patient is here for a follow up on Mood.   ? ?HYPERTENSION / HYPERLIPIDEMIA/HF ?Continues on Losartan, Metoprolol, Lasix, Amlodipine, and Plavix.  Followed by cardiology for CAD, HFpEF and HTN, last seen 11/24/21 -- no changes made.  No recent NTG use, has not used at all.  ? ?He is interested in weight loss, interested in the weekly shots [facility]).  No family history of thyroid cancer and no history of pancreatitis.  Discussed at length with patient medication education.   ?Satisfied with current treatment? yes ?Duration of hypertension: chronic ?BP monitoring frequency: daily ?BP range: 136-145/79-84 range at home over past month ?BP medication side effects: no ?Duration of hyperlipidemia: chronic ?Cholesterol medication side effects: no ?Cholesterol supplements: none ?Medication compliance: good compliance ?Aspirin: none ?Recent stressors: no ?Recurrent headaches: no ?Visual changes: no ?Palpitations: no ?Dyspnea: no ?Chest pain: no ?Lower extremity edema: no ?Dizzy/lightheaded: no  ?  ?B-CELL LYMPHOMA: ?Followed by oncology and last seen 07/22/21.  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  Remains stable on review records.  Denies B symptoms.   ?  ?ANXIETY/STRESS ?Continues on Ativan 0.5 to 1 MG as needed daily and Sertraline 50 MG.  Takes Ativan as needed only, in about a week he may take one a day for two days.  He often gets 4 refills a year. Pt is aware of risks of benzo medication use to include increased sedation, respiratory  suppression, falls, dependence and cardiovascular events.  Pt would like to continue treatment as benefit determined to outweigh risk.  On review of PDMP last Ativan fill 12/16/21 for #30 tablets.   ?Duration:uncontrolled ?Anxious mood: yes  ?Excessive worrying: yes ?Irritability: yes  ?Sweating: no ?Nausea: no ?Palpitations:no ?Hyperventilation: no ?Panic attacks: yes ?Agoraphobia: no  ?Obscessions/compulsions: no ?Depressed mood: no ? ?  03/22/2022  ? 11:02 AM 01/24/2022  ?  8:12 AM 09/15/2021  ? 10:12 AM 06/20/2021  ?  9:07 AM 03/15/2021  ? 11:05 AM  ?Depression screen PHQ 2/9  ?Decreased Interest 1 0 0 0 0  ?Down, Depressed, Hopeless 1 0 0 0 0  ?PHQ - 2 Score 2 0 0 0 0  ?Altered sleeping 1 0 0  0  ?Tired, decreased energy 0 0 0  0  ?Change in appetite 0 0 0  0  ?Feeling bad or failure about yourself  0 0 0  0  ?Trouble concentrating 1 0 0  0  ?Moving slowly or fidgety/restless 0 0 0  0  ?Suicidal thoughts 0 0 0  0  ?PHQ-9 Score 4 0 0  0  ?Difficult doing work/chores  Not difficult at all Not difficult at all    ? ? ?  03/22/2022  ? 11:03 AM 01/24/2022  ?  8:12 AM 09/15/2021  ? 10:13 AM 09/14/2020  ?  1:28 PM  ?GAD 7 : Generalized Anxiety Score  ?Nervous, Anxious, on Edge 1  0 1 0  ?Control/stop worrying 0 0 0 0  ?Worry too much - different things 0 0 0 0  ?Trouble relaxing 1 0 0 0  ?Restless 0 0 0 0  ?Easily annoyed or irritable 0 0 0 0  ?Afraid - awful might happen 0 0 0 0  ?Total GAD 7 Score 2 0 1 0  ?Anxiety Difficulty Somewhat difficult Not difficult at all Not difficult at all Not difficult at all  ? ? ?Relevant past medical, surgical, family and social history reviewed and updated as indicated. Interim medical history since our last visit reviewed. ?Allergies and medications reviewed and updated. ? ?Review of Systems  ?Constitutional:  Negative for activity change, diaphoresis, fatigue and fever.  ?Respiratory:  Negative for cough, chest tightness, shortness of breath and wheezing.   ?Cardiovascular:  Negative for  chest pain, palpitations and leg swelling.  ?Gastrointestinal: Negative.   ?Neurological: Negative.   ?Psychiatric/Behavioral:  Negative for decreased concentration, self-injury, sleep disturbance and suicidal ideas. The patient is not nervous/anxious.   ? ?Per HPI unless specifically indicated above ? ?   ?Objective:  ?  ?BP 136/84 (BP Location: Left Arm, Patient Position: Sitting, Cuff Size: Normal)   Pulse 85   Temp 97.9 ?F (36.6 ?C) (Oral)   Ht $R'5\' 11"'VY$  (1.803 m)   Wt 274 lb 6.4 oz (124.5 kg)   SpO2 96%   BMI 38.27 kg/m?   ?Wt Readings from Last 3 Encounters:  ?03/22/22 274 lb 6.4 oz (124.5 kg)  ?03/07/22 273 lb (123.8 kg)  ?11/24/21 272 lb (123.4 kg)  ?  ?Physical Exam ?Vitals and nursing note reviewed.  ?Constitutional:   ?   General: He is awake. He is not in acute distress. ?   Appearance: He is well-developed and well-groomed. He is morbidly obese. He is not ill-appearing.  ?HENT:  ?   Head: Normocephalic and atraumatic.  ?   Right Ear: Hearing normal. No drainage.  ?   Left Ear: Hearing normal. No drainage.  ?Eyes:  ?   General: Lids are normal.     ?   Right eye: No discharge.     ?   Left eye: No discharge.  ?   Conjunctiva/sclera: Conjunctivae normal.  ?   Pupils: Pupils are equal, round, and reactive to light.  ?Neck:  ?   Vascular: No carotid bruit.  ?   Trachea: Trachea normal.  ?Cardiovascular:  ?   Rate and Rhythm: Normal rate and regular rhythm.  ?   Heart sounds: Normal heart sounds, S1 normal and S2 normal. No murmur heard. ?  No gallop.  ?Pulmonary:  ?   Effort: Pulmonary effort is normal. No accessory muscle usage or respiratory distress.  ?   Breath sounds: Normal breath sounds.  ?Abdominal:  ?   General: Bowel sounds are normal.  ?   Palpations: Abdomen is soft.  ?Musculoskeletal:     ?   General: Normal range of motion.  ?   Cervical back: Normal range of motion and neck supple.  ?   Right lower leg: No edema.  ?   Left lower leg: No edema.  ?Skin: ?   General: Skin is warm and dry.  ?    Capillary Refill: Capillary refill takes less than 2 seconds.  ?   Comments: Scattered pale purple bruises bilateral upper extremities noted.  ?Neurological:  ?   Mental Status: He is alert and oriented to person, place, and time.  ?Psychiatric:     ?  Attention and Perception: Attention normal.     ?   Mood and Affect: Mood normal.     ?   Speech: Speech normal.     ?   Behavior: Behavior normal. Behavior is cooperative.     ?   Thought Content: Thought content normal.  ? ?Results for orders placed or performed in visit on 09/15/21  ?Bayer DCA Hb A1c Waived  ?Result Value Ref Range  ? HB A1C (BAYER DCA - WAIVED) 5.9 (H) 4.8 - 5.6 %  ?CBC with Differential/Platelet  ?Result Value Ref Range  ? WBC 7.0 3.4 - 10.8 x10E3/uL  ? RBC 4.10 (L) 4.14 - 5.80 x10E6/uL  ? Hemoglobin 14.2 13.0 - 17.7 g/dL  ? Hematocrit 42.1 37.5 - 51.0 %  ? MCV 103 (H) 79 - 97 fL  ? MCH 34.6 (H) 26.6 - 33.0 pg  ? MCHC 33.7 31.5 - 35.7 g/dL  ? RDW 12.4 11.6 - 15.4 %  ? Platelets 295 150 - 450 x10E3/uL  ? Neutrophils 52 Not Estab. %  ? Lymphs 30 Not Estab. %  ? Monocytes 13 Not Estab. %  ? Eos 3 Not Estab. %  ? Basos 2 Not Estab. %  ? Neutrophils Absolute 3.6 1.4 - 7.0 x10E3/uL  ? Lymphocytes Absolute 2.1 0.7 - 3.1 x10E3/uL  ? Monocytes Absolute 0.9 0.1 - 0.9 x10E3/uL  ? EOS (ABSOLUTE) 0.2 0.0 - 0.4 x10E3/uL  ? Basophils Absolute 0.1 0.0 - 0.2 x10E3/uL  ? Immature Granulocytes 0 Not Estab. %  ? Immature Grans (Abs) 0.0 0.0 - 0.1 x10E3/uL  ?Comprehensive metabolic panel  ?Result Value Ref Range  ? Glucose 102 (H) 70 - 99 mg/dL  ? BUN 20 8 - 27 mg/dL  ? Creatinine, Ser 0.94 0.76 - 1.27 mg/dL  ? eGFR 84 >59 mL/min/1.73  ? BUN/Creatinine Ratio 21 10 - 24  ? Sodium 141 134 - 144 mmol/L  ? Potassium 4.3 3.5 - 5.2 mmol/L  ? Chloride 98 96 - 106 mmol/L  ? CO2 27 20 - 29 mmol/L  ? Calcium 9.8 8.6 - 10.2 mg/dL  ? Total Protein 7.0 6.0 - 8.5 g/dL  ? Albumin 4.6 3.7 - 4.7 g/dL  ? Globulin, Total 2.4 1.5 - 4.5 g/dL  ? Albumin/Globulin Ratio 1.9 1.2 - 2.2  ?  Bilirubin Total 1.0 0.0 - 1.2 mg/dL  ? Alkaline Phosphatase 84 44 - 121 IU/L  ? AST 38 0 - 40 IU/L  ? ALT 28 0 - 44 IU/L  ?PSA  ?Result Value Ref Range  ? Prostate Specific Ag, Serum 1.9 0.0 - 4.0 ng/mL  ?TSH  ?Result Value Ref

## 2022-03-22 NOTE — Assessment & Plan Note (Signed)
Chronic, ongoing.  Continue collaboration with oncology, appreciate their ongoing input. ?

## 2022-03-22 NOTE — Assessment & Plan Note (Signed)
Chronic, ongoing.  Continue current medication and adjust as needed.  Lipid panel up to date. ?

## 2022-03-23 NOTE — Telephone Encounter (Signed)
Appeal letter faxed to [employer] at [phone].  ?

## 2022-03-24 NOTE — Discharge Instructions (Signed)

## 2022-03-28 ENCOUNTER — Ambulatory Visit: Payer: Medicare Other | Admitting: Anesthesiology

## 2022-03-28 ENCOUNTER — Other Ambulatory Visit: Payer: Self-pay

## 2022-03-28 ENCOUNTER — Ambulatory Visit
Admission: RE | Admit: 2022-03-28 | Discharge: 2022-03-28 | Disposition: A | Discharge status: Home / Self Care | Payer: Medicare Other | Attending: Ophthalmology | Admitting: Ophthalmology

## 2022-03-28 ENCOUNTER — Encounter
Admission: RE | Disposition: A | Discharge status: Home / Self Care | Payer: Self-pay | Source: Home / Self Care | Attending: Ophthalmology

## 2022-03-28 ENCOUNTER — Encounter: Payer: Self-pay | Admitting: Ophthalmology

## 2022-03-28 DIAGNOSIS — Z79890 Hormone replacement therapy (postmenopausal): Secondary | ICD-10-CM | POA: Diagnosis not present

## 2022-03-28 DIAGNOSIS — I110 Hypertensive heart disease with heart failure: Secondary | ICD-10-CM | POA: Diagnosis not present

## 2022-03-28 DIAGNOSIS — Z955 Presence of coronary angioplasty implant and graft: Secondary | ICD-10-CM | POA: Diagnosis not present

## 2022-03-28 DIAGNOSIS — Z6838 Body mass index (BMI) 38.0-38.9, adult: Secondary | ICD-10-CM | POA: Insufficient documentation

## 2022-03-28 DIAGNOSIS — F419 Anxiety disorder, unspecified: Secondary | ICD-10-CM | POA: Insufficient documentation

## 2022-03-28 DIAGNOSIS — I2510 Atherosclerotic heart disease of native coronary artery without angina pectoris: Secondary | ICD-10-CM | POA: Insufficient documentation

## 2022-03-28 DIAGNOSIS — Z7902 Long term (current) use of antithrombotics/antiplatelets: Secondary | ICD-10-CM | POA: Insufficient documentation

## 2022-03-28 DIAGNOSIS — I5032 Chronic diastolic (congestive) heart failure: Secondary | ICD-10-CM | POA: Diagnosis not present

## 2022-03-28 DIAGNOSIS — H25811 Combined forms of age-related cataract, right eye: Secondary | ICD-10-CM | POA: Diagnosis not present

## 2022-03-28 DIAGNOSIS — H2511 Age-related nuclear cataract, right eye: Secondary | ICD-10-CM | POA: Insufficient documentation

## 2022-03-28 DIAGNOSIS — K219 Gastro-esophageal reflux disease without esophagitis: Secondary | ICD-10-CM | POA: Insufficient documentation

## 2022-03-28 DIAGNOSIS — M109 Gout, unspecified: Secondary | ICD-10-CM | POA: Diagnosis not present

## 2022-03-28 HISTORY — PX: CATARACT EXTRACTION W/PHACO: SHX586

## 2022-03-28 LAB — DRUG SCREEN 764883 11+OXYCO+ALC+CRT-BUND

## 2022-03-28 SURGERY — PHACOEMULSIFICATION, CATARACT, WITH IOL INSERTION
Anesthesia: Monitor Anesthesia Care | Site: Eye | Laterality: Right

## 2022-03-28 MED ORDER — BRIMONIDINE TARTRATE-TIMOLOL 0.2-0.5 % OP SOLN
OPHTHALMIC | Status: DC | PRN
  Administered 2022-03-28: 1 [drp] via OPHTHALMIC

## 2022-03-28 MED ORDER — OXYCODONE HCL 5 MG/5ML PO SOLN: 5.0000 mg | Freq: Once | ORAL | Status: DC | PRN

## 2022-03-28 MED ORDER — SIGHTPATH DOSE#1 BSS IO SOLN
INTRAOCULAR | Status: DC | PRN
  Administered 2022-03-28: 1 mL via INTRAMUSCULAR

## 2022-03-28 MED ORDER — SIGHTPATH DOSE#1 NA CHONDROIT SULF-NA HYALURON 40-17 MG/ML IO SOLN
INTRAOCULAR | Status: DC | PRN
  Administered 2022-03-28: 1 mL via INTRAOCULAR

## 2022-03-28 MED ORDER — OXYCODONE HCL 5 MG PO TABS: 5.0000 mg | ORAL_TABLET | Freq: Once | ORAL | Status: DC | PRN

## 2022-03-28 MED ORDER — LACTATED RINGERS IV SOLN: INTRAVENOUS | Status: DC

## 2022-03-28 MED ORDER — MIDAZOLAM HCL 2 MG/2ML IJ SOLN
INTRAMUSCULAR | Status: DC | PRN
  Administered 2022-03-28: 2 mg via INTRAVENOUS

## 2022-03-28 MED ORDER — FENTANYL CITRATE (PF) 100 MCG/2ML IJ SOLN
INTRAMUSCULAR | Status: DC | PRN
Start: 2022-03-28 — End: 2022-03-28
  Administered 2022-03-28: 100 ug via INTRAVENOUS

## 2022-03-28 MED ORDER — ARMC OPHTHALMIC DILATING DROPS
1.0000 "application " | OPHTHALMIC | Status: DC | PRN
  Administered 2022-03-28 (×3): 1 via OPHTHALMIC

## 2022-03-28 MED ORDER — MOXIFLOXACIN HCL 0.5 % OP SOLN
OPHTHALMIC | Status: DC | PRN
  Administered 2022-03-28: 0.2 mL via OPHTHALMIC

## 2022-03-28 MED ORDER — SIGHTPATH DOSE#1 BSS IO SOLN
INTRAOCULAR | Status: DC | PRN
  Administered 2022-03-28: 15 mL

## 2022-03-28 MED ORDER — TETRACAINE HCL 0.5 % OP SOLN
1.0000 [drp] | OPHTHALMIC | Status: DC | PRN
  Administered 2022-03-28 (×3): 1 [drp] via OPHTHALMIC

## 2022-03-28 MED ORDER — SIGHTPATH DOSE#1 BSS IO SOLN
INTRAOCULAR | Status: DC | PRN
  Administered 2022-03-28: 60 mL via OPHTHALMIC

## 2022-03-28 SURGICAL SUPPLY — 12 items
CATARACT SUITE SIGHTPATH (MISCELLANEOUS) ×2 IMPLANT
FEE CATARACT SUITE SIGHTPATH (MISCELLANEOUS) ×1 IMPLANT
GLOVE SURG ENC TEXT LTX SZ8 (GLOVE) ×2 IMPLANT
GLOVE SURG TRIUMPH 8.0 PF LTX (GLOVE) ×2 IMPLANT
LENS IOL ACRSF IQ VT 15 21.0 IMPLANT
LENS IOL ACRYSOF VIVITY 21.0 ×2 IMPLANT
LENS IOL VIVITY 015 21.0 ×1 IMPLANT
NDL FILTER BLUNT 18X1 1/2 (NEEDLE) ×1 IMPLANT
NEEDLE FILTER BLUNT 18X 1/2SAF (NEEDLE) ×1
NEEDLE FILTER BLUNT 18X1 1/2 (NEEDLE) ×1 IMPLANT
SYR 3ML LL SCALE MARK (SYRINGE) ×2 IMPLANT
WATER STERILE IRR 250ML POUR (IV SOLUTION) ×2 IMPLANT

## 2022-03-28 NOTE — Anesthesia Preprocedure Evaluation (Signed)
Anesthesia Evaluation  ?Patient identified by MRN, date of birth, ID band ?Patient awake ? ? ? ?Reviewed: ?Allergy & Precautions, H&P , NPO status , Patient's Chart, lab work & pertinent test results, reviewed documented beta blocker date and time  ? ?History of Anesthesia Complications ?Negative for: history of anesthetic complications ? ?Airway ?Mallampati: II ? ?TM Distance: >3 FB ?Neck ROM: full ? ? ? Dental ?no notable dental hx. ? ?  ?Pulmonary ?neg pulmonary ROS,  ?  ?Pulmonary exam normal ?breath sounds clear to auscultation ? ? ? ? ? ? Cardiovascular ?Exercise Tolerance: Good ?hypertension, + CAD, + Cardiac Stents (x2 2020) and + Peripheral Vascular Disease  ?Normal cardiovascular exam ?Rhythm:regular Rate:Normal ? ?cards: Nelva Bush, MD at 11/24/2021  ; ? ?cath 2020: ?1. Severe, heavily calcified mid LAD stenosis (80-90%), unchanged from diagnostic catheterization earlier this month. ?2. Widely patent LCx stent. ?3. Normal left ventricular filling pressure. ?4. Successful orbital atherectomy and IVUS-guided PCI to the mid LAD using a Resolute Onyx 3.0 x 15 mm drug-eluting stent (postdilated to 3.6 mm) with 0% residual stenosis and TIMI-3 flow.; ? ?ekg: 08/2021: nsr; ?  ?Neuro/Psych ?Anxiety Depression negative neurological ROS ?   ? GI/Hepatic ?Neg liver ROS, GERD  Controlled,Barrett's esophagus ?  ?Endo/Other  ?Morbid obesity (bmi 38) ? Renal/GU ?negative Renal ROS  ?negative genitourinary ?  ?Musculoskeletal ?gout  ? Abdominal ?  ?Peds ? Hematology ?negative hematology ROS ?(+)   ?Anesthesia Other Findings ? ? ?last plavix: 4/18; ? ? ?had ecce on 03/07/2022; ? Reproductive/Obstetrics ?negative OB ROS ? ?  ? ? ? ? ? ? ? ? ? ? ? ? ? ?  ?  ? ? ? ? ? ? ? ? ?Anesthesia Physical ? ?Anesthesia Plan ? ?ASA: 3 ? ?Anesthesia Plan: MAC  ? ?Post-op Pain Management:   ? ?Induction:  ? ?PONV Risk Score and Plan: TIVA ? ?Airway Management Planned:  ? ?Additional Equipment:   ? ?Intra-op Plan:  ? ?Post-operative Plan:  ? ?Informed Consent: I have reviewed the patients History and Physical, chart, labs and discussed the procedure including the risks, benefits and alternatives for the proposed anesthesia with the patient or authorized representative who has indicated his/her understanding and acceptance.  ? ? ? ?Dental Advisory Given ? ?Plan Discussed with: CRNA and Anesthesiologist ? ?Anesthesia Plan Comments:   ? ? ? ? ? ? ?Anesthesia Quick Evaluation ? ?

## 2022-03-28 NOTE — Transfer of Care (Signed)
Immediate Anesthesia Transfer of Care Note ? ?Patient: Jesse Norton ? ?Procedure(s) Performed: CATARACT EXTRACTION PHACO AND INTRAOCULAR LENS PLACEMENT (IOC) RIGHT VIVITY LENS 6.68 00:50.0 (Right: Eye) ? ?Patient Location: PACU ? ?Anesthesia Type: MAC ? ?Level of Consciousness: awake, alert  and patient cooperative ? ?Airway and Oxygen Therapy: Patient Spontanous Breathing and Patient connected to supplemental oxygen ? ?Post-op Assessment: Post-op Vital signs reviewed, Patient's Cardiovascular Status Stable, Respiratory Function Stable, Patent Airway and No signs of Nausea or vomiting ? ?Post-op Vital Signs: Reviewed and stable ? ?Complications: No notable events documented. ? ?

## 2022-03-28 NOTE — H&P (Signed)
[place]  ? ?Primary Care Physician:  Venita Lick, NP ?Ophthalmologist: Dr. George Ina ? ?Pre-Procedure History & Physical: ?HPI:  Jesse Norton is a 76 y.o. male here for cataract surgery. ?  ?Past Medical History:  ?Diagnosis Date  ? (HFpEF) heart failure with preserved ejection fraction ([place])   ? a. 01/2019 Cath: EF 55-60% w/ elevated LVEDP.  ? Anxiety   ? Barrett's esophagus   ? CAD (coronary artery disease)   ? a. 10/2018 ETT: 69m horizontal inflat ST depression, freq PVC's->Intermediate; b. 12/2018 Cardiac CTA: LM nl, LAD FFRct 0.6 in mLAD, LCX FFRct 0.99p, 0.874m0.77d, RCA no signif dzs, FFRct 0.89d->Rec cath; c. 01/2019 PCI: LM nl, LAD 40p, 8546m2 70, LCX 70p (2.75x15 Resolute Onyx DES), RCA min irregs. EF 55-65%. EDP 25-19m9m d. 01/2019 PCI: LAD 80-90 (atherectomy & 3x15 Resolute Onyx DES).  ? Colon polyps   ? Duodenal adenoma   ? Gastritis   ? GERD (gastroesophageal reflux disease)   ? Gout   ? H. pylori infection   ? H/O urethral stricture   ? Hemorrhoids   ? Hiatal hernia   ? Hypertension   ? Stomach ulcer   ? ? ?Past Surgical History:  ?Procedure Laterality Date  ? CARDIAC CATHETERIZATION    ? CATARACT EXTRACTION W/PHACO Left 03/07/2022  ? Procedure: CATARACT EXTRACTION PHACO AND INTRAOCULAR LENS PLACEMENT (IOC)DurantFT VIVITY TORIC LENS 10.41 00:55.7;  Surgeon: PorfBirder Robson;  [facility];  Laterality: Left;  ? COLONOSCOPY    ? COLONOSCOPY WITH PROPOFOL N/A 08/20/2018  ? Procedure: COLONOSCOPY WITH PROPOFOL;  Surgeon: SkulLollie Sails;  [facility];  Service: Endoscopy;  Laterality: N/A;  ? COLONOSCOPY WITH PROPOFOL N/A 12/19/2018  ? Procedure: COLONOSCOPY WITH PROPOFOL;  Surgeon: SkulLollie Sails;  [facility];  Service: Endoscopy;  Laterality: N/A;  ? CORONARY ATHERECTOMY N/A 02/03/2019  ? Procedure: CORONARY ATHERECTOMY;  Surgeon: End,Nelva Bush;  [facility];  Service:  Cardiovascular;  Laterality: N/A;  ? CORONARY STENT INTERVENTION N/A 01/21/2019  ? Procedure: CORONARY STENT INTERVENTION;  Surgeon: End,Nelva Bush;  [facility];  Service: Cardiovascular;  Laterality: N/A;  ? CORONARY STENT INTERVENTION N/A 02/03/2019  ? Procedure: CORONARY STENT INTERVENTION;  Surgeon: End,Nelva Bush;  [facility];  Service: Cardiovascular;  Laterality: N/A;  ? CYSTOSCOPY WITH URETHRAL DILATATION N/A 08/16/2018  ? Procedure: CYSTOSCOPY WITH URETHRAL DILATATION;  Surgeon: SninBilley Co;  [facility];  Service: Urology;  Laterality: N/A;  ? ESOPHAGOGASTRODUODENOSCOPY N/A 01/29/2017  ? Procedure: ESOPHAGOGASTRODUODENOSCOPY (EGD);  Surgeon: MartLollie Sails;  [facility];  Service: Endoscopy;  Laterality: N/A;  ? ESOPHAGOGASTRODUODENOSCOPY (EGD) WITH PROPOFOL N/A 07/10/2016  ? Procedure: ESOPHAGOGASTRODUODENOSCOPY (EGD) WITH PROPOFOL;  Surgeon: MartLollie Sails;  [facility];  Service: Endoscopy;  Laterality: N/A;  ? ESOPHAGOGASTRODUODENOSCOPY (EGD) WITH PROPOFOL N/A 12/19/2018  ? Procedure: ESOPHAGOGASTRODUODENOSCOPY (EGD) WITH PROPOFOL;  Surgeon: SkulLollie Sails;  [facility];  Service: Endoscopy;  Laterality: N/A;  ? gastritis    ? h.pylori    ? INTRAVASCULAR ULTRASOUND/IVUS N/A 02/03/2019  ? Procedure: Intravascular Ultrasound/IVUS;  Surgeon: End,Nelva Bush;  [facility];  Service: Cardiovascular;  Laterality: N/A;  ? left elbow repair Left   ? LEFT HEART CATH AND CORONARY ANGIOGRAPHY Left 01/21/2019  ? Procedure: LEFT HEART CATH AND CORONARY ANGIOGRAPHY;  Surgeon: End,Nelva Bush;  Location: [place] [facility] LAB;  Service: Cardiovascular;  Laterality: Left;  ? SPLENECTOMY  1957  ? TONSILLECTOMY    ? ? ?Prior to Admission medications   ?Medication Sig Start Date End Date Taking? Authorizing Provider  ?allopurinol (ZYLOPRIM) 300 MG tablet Take 1 tablet  (300 mg total) by mouth 3 (three) times a week. 03/22/22  Yes Cannady, Jolene T, NP  ?amLODipine (NORVASC) 10 MG tablet TAKE 1 TABLET(10 MG) BY MOUTH DAILY 09/09/21  Yes Cannady, Jolene T, NP  ?atorvastatin (LIPITOR) 40 MG tablet Take 1 tablet (40 mg total) by mouth daily. 09/15/21  Yes Cannady, Henrine Screws T, NP  ?clobetasol ointment (TEMOVATE) 0.05 % Apply topically. 01/20/22  Yes [provider]  ?clopidogrel (PLAVIX) 75 MG tablet Take 1 tablet (75 mg total) by mouth daily with breakfast. 09/15/21  Yes Cannady, Jolene T, NP  ?furosemide (LASIX) 20 MG tablet Take 1 tablet (20 mg total) by mouth daily. 02/23/21  Yes End, Harrell Gave, MD  ?LORazepam (ATIVAN) 1 MG tablet Take 0.5-1 tablets (0.5-1 mg total) by mouth daily as needed for anxiety. 03/22/22  Yes Cannady, Jolene T, NP  ?losartan (COZAAR) 100 MG tablet TAKE 1 TABLET(100 MG) BY MOUTH DAILY 08/18/21  Yes Cannady, Jolene T, NP  ?metoprolol tartrate (LOPRESSOR) 25 MG tablet TAKE 1 TABLET(25 MG) BY MOUTH TWICE DAILY 08/18/21  Yes Cannady, Jolene T, NP  ?nitroGLYCERIN (NITROSTAT) 0.4 MG SL tablet Place 1 tablet (0.4 mg total) under the tongue every 5 (five) minutes as needed for chest pain. Maximum of 3 doses. 08/25/20  Yes End, Harrell Gave, MD  ?OVER THE COUNTER MEDICATION AGI (agricultural greens) powder form by mouth once daily   Yes [provider]  ?pantoprazole (PROTONIX) 20 MG tablet Take 1 tablet (20 mg total) by mouth 2 (two) times daily. 03/22/22  Yes Cannady, Henrine Screws T, NP  ?sertraline (ZOLOFT) 50 MG tablet Take 1 tablet (50 mg total) by mouth daily. 03/22/22  Yes Venita Lick, NP  ?Semaglutide-Weight Management (WEGOVY) 0.25 MG/0.5ML SOAJ Inject 0.25 mg into the skin once a week. 03/22/22   Venita Lick, NP  ? ? ?Allergies as of 01/20/2022  ? (No Known Allergies)  ? ? ?Family History  ?Adopted: Yes  ?Problem Relation Age of Onset  ? Heart attack Mother 86  ? Pancreatic cancer Father   ?     died in WWII - pt adopted  ? Hypertension Sister   ?  Hyperlipidemia Sister   ? Hypertension Brother   ? Hyperlipidemia Brother   ? Prostate cancer Neg Hx   ? Kidney cancer Neg Hx   ? Bladder Cancer Neg Hx   ? ? ?Social History  ? ?Socioeconomic History  ? Marital status: Married  ?  Spouse name: Not on file  ? Number of children: Not on file  ? Years of education: Not on file  ? Highest education level: Master's degree (e.g., MA, MS, MEng, MEd, MSW, MBA)  ?Occupational History  ? Occupation: retired   ?Tobacco Use  ? Smoking status: Never  ? Smokeless tobacco: Never  ?Vaping Use  ? Vaping Use: Never used  ?Substance and Sexual Activity  ? Alcohol use: Yes  ?  Alcohol/week: 5.0 standard drinks  ?  Types: 5 Standard drinks or equivalent per week  ?  Comment: moderate  ? Drug use: No  ? Sexual activity: Yes  ?Other Topics Concern  ? Not on file  ?Social History Narrative  ? Rides bicycle with friends   ?   ?  No smoking.  Occasional alcohol.  Lives at home.  Retired Customer service manager.  ? ?Social Determinants of Health  ? ?Financial Resource Strain: Low Risk   ? Difficulty of Paying Living Expenses: Not hard at all  ?Food Insecurity: No Food Insecurity  ? Worried About Charity fundraiser in the Last Year: Never true  ? Ran Out of Food in the Last Year: Never true  ?Transportation Needs: No Transportation Needs  ? Lack of Transportation (Medical): No  ? Lack of Transportation (Non-Medical): No  ?Physical Activity: Insufficiently Active  ? Days of Exercise per Week: 4 days  ? Minutes of Exercise per Session: 30 min  ?Stress: No Stress Concern Present  ? Feeling of Stress : Not at all  ?Social Connections: Not on file  ?Intimate Partner Violence: Not on file  ? ? ?Review of Systems: ?See HPI, otherwise negative ROS ? ?Physical Exam: ?BP (!) 167/91   Pulse 73   Temp (!) 97.5 ?F (36.4 ?C) (Temporal)   Ht '5\' 11"'$  (1.803 m)   Wt 125.1 kg   SpO2 94%   BMI 38.48 kg/m?  ?General:   Alert, cooperative in NAD ?Head:  Normocephalic and atraumatic. ?Respiratory:  Normal work of  breathing. ?Cardiovascular:  RRR ? ?Impression/Plan: ?KAIDIN BOEHLE is here for cataract surgery. ? ?Risks, benefits, limitations, and alternatives regarding cataract surgery have been reviewed with the patient.  Questions h

## 2022-03-28 NOTE — Anesthesia Postprocedure Evaluation (Signed)
Anesthesia Post Note ? ?Patient: Jesse Norton ? ?Procedure(s) Performed: CATARACT EXTRACTION PHACO AND INTRAOCULAR LENS PLACEMENT (IOC) RIGHT VIVITY LENS 6.68 00:50.0 (Right: Eye) ? ? ?  ?Patient location during evaluation: PACU ?Anesthesia Type: MAC ?Level of consciousness: awake and alert ?Pain management: pain level controlled ?Vital Signs Assessment: post-procedure vital signs reviewed and stable ?Respiratory status: spontaneous breathing, nonlabored ventilation, respiratory function stable and patient connected to nasal cannula oxygen ?Cardiovascular status: stable and blood pressure returned to baseline ?Postop Assessment: no apparent nausea or vomiting ?Anesthetic complications: no ? ? ?No notable events documented. ? ?Fidel Levy ? ? ? ? ? ?

## 2022-03-28 NOTE — Op Note (Signed)
PREOPERATIVE DIAGNOSIS:  Nuclear sclerotic cataract of the right eye. ?  ?POSTOPERATIVE DIAGNOSIS:  H25.11 Cataract ?  ?OPERATIVE PROCEDURE:ORPROCALL@ ?  ?SURGEON:  Birder Robson, MD. ?  ?ANESTHESIA: ? ?Anesthesiologist: Fidel Levy, MD ?CRNA: Silvana Newness, CRNA ? ?1.      Managed anesthesia care. ?2.      0.63m of Shugarcaine was instilled in the eye following the paracentesis. ?  ?COMPLICATIONS:  None. ?  ?TECHNIQUE:   Stop and chop ?  ?DESCRIPTION OF PROCEDURE:  The patient was examined and consented in the preoperative holding area where the aforementioned topical anesthesia was applied to the right eye and then brought back to the Operating Room where the right eye was prepped and draped in the usual sterile ophthalmic fashion and a lid speculum was placed. A paracentesis was created with the side port blade and the anterior chamber was filled with viscoelastic. A near clear corneal incision was performed with the steel keratome. A continuous curvilinear capsulorrhexis was performed with a cystotome followed by the capsulorrhexis forceps. Hydrodissection and hydrodelineation were carried out with BSS on a blunt cannula. The lens was removed in a stop and chop  technique and the remaining cortical material was removed with the irrigation-aspiration handpiece. The capsular bag was inflated with viscoelastic and the Technis [id number]  lens was placed in the capsular bag without complication. The remaining viscoelastic was removed from the eye with the irrigation-aspiration handpiece. The wounds were hydrated. The anterior chamber was flushed with BSS and the eye was inflated to physiologic pressure. 0.134mof Vigamox was placed in the anterior chamber. The wounds were found to be water tight. The eye was dressed with Combigan. The patient was given protective glasses to wear throughout the day and a shield with which to sleep tonight. The patient was also given drops with which to begin a drop regimen today and  will follow-up with me in one day. ?Implant Name Type Inv. Item Serial No. Manufacturer Lot No. LRB No. Used Action  ?LENS IOL ACRYSOF VIVITY 21.0 - [id number] IOL ACRYSOF VIVITY 21.0 [id number]  Right 1 Implanted  ? ?Procedure(s): ?CATARACT EXTRACTION PHACO AND INTRAOCULAR LENS PLACEMENT (IOC) RIGHT VIVITY LENS 6.68 00:50.0 (Right) ? ?Electronically signed: WiBirder Robson/18/2023 10:06 AM ? ?

## 2022-03-29 ENCOUNTER — Encounter: Payer: Self-pay | Admitting: Ophthalmology

## 2022-04-30 NOTE — Patient Instructions (Signed)

## 2022-05-03 ENCOUNTER — Ambulatory Visit (INDEPENDENT_AMBULATORY_CARE_PROVIDER_SITE_OTHER): Payer: Medicare Other | Admitting: Nurse Practitioner

## 2022-05-03 ENCOUNTER — Encounter: Payer: Self-pay | Admitting: Nurse Practitioner

## 2022-05-03 DIAGNOSIS — L729 Follicular cyst of the skin and subcutaneous tissue, unspecified: Secondary | ICD-10-CM | POA: Diagnosis not present

## 2022-05-03 MED ORDER — WEGOVY 0.25 MG/0.5ML ~~LOC~~ SOAJ: 0.2500 mg | SUBCUTANEOUS | 2 refills | Status: DC

## 2022-05-03 MED ORDER — SAXENDA 18 MG/3ML ~~LOC~~ SOPN: PEN_INJECTOR | SUBCUTANEOUS | 4 refills | Status: DC

## 2022-05-03 NOTE — Assessment & Plan Note (Signed)
Left upper arm interior aspect with tenderness.  Will get ultrasound of area to further assess and send to general surgery for removal after imaging returns dependent on findings.

## 2022-05-03 NOTE — Assessment & Plan Note (Signed)
BMI 38.27 with underlying cardiac issues -- will attempt to get Saxenda coverage (insurance would not cover [phone] and offered this as alternative) which would offer benefit to this patient for weight loss and overall health.  No family history of thyroid cancer and no personal history of pancreatitis.  Educated on this medication at length.  Recommended eating smaller high protein, low fat meals more frequently and exercising 30 mins a day 5 times a week with a goal of 10-15lb weight loss in the next 3 months. Patient voiced their understanding and motivation to adhere to these recommendations.  Return in beginning of July.

## 2022-05-03 NOTE — Progress Notes (Signed)
BP 135/83 (BP Location: Left Arm, Cuff Size: Normal)   Pulse 76   Temp 98 F (36.7 C) (Oral)   Ht '5\' 11"'$  (1.803 m)   Wt 271 lb 12.8 oz (123.3 kg)   SpO2 93%   BMI 37.91 kg/m    Subjective:    Patient ID: Jesse Norton, male    DOB: 12/20/1945, 76 y.o.   MRN: [id number]  HPI: Jesse Norton is a 76 y.o. male  Chief Complaint  Patient presents with   Weight Check    Patient is here for a Weight Check follow up visit. Patient states Medicare has yet to approve his prescription for [facility].    Cyst    Patient would like to discuss with provider a knot he has noticed a few years ago on his L bicep and he has noticed it has increased in size and would like to discuss with provider at today's visit. Patient says the area can be tender at times.    OBESITY Last visit ordered Wegovy, but Medicare has yet to approve -- they have recommended alternatives Saxenda or Contrave to trial.  Over last 6 months has gone on Weight Watchers diet and cut out carbs without benefit + at baseline is very active, often outside hiking -- continues to maintain is 270 range and feels stuck.  His goal is to get down to 215 lbs.  He would like to start [facility] as understands benefit this can offer with his heart failure and CAD.  No family history of thyroid cancer and no personal history of pancreatitis.  We discussed Saxenda and he would like to try this.  SKIN CYST Has area to left bicep that previous PCP assessed a couple years ago.  Sometimes it itches and sometimes feels like it is burning. Duration: months Location: left bicep Painful: yes Itching: yes Onset: gradual Context:  at times it gets bigger Associated signs and symptoms: painful History of skin cancer: no History of precancerous skin lesions: no Family history of skin cancer: no   Relevant past medical, surgical, family and social history reviewed and updated as indicated. Interim medical history since our last visit  reviewed. Allergies and medications reviewed and updated.  Review of Systems  Constitutional:  Negative for activity change, diaphoresis, fatigue and fever.  Respiratory:  Negative for cough, chest tightness, shortness of breath and wheezing.   Cardiovascular:  Negative for chest pain, palpitations and leg swelling.  Gastrointestinal: Negative.   Neurological: Negative.   Psychiatric/Behavioral:  Negative for decreased concentration, self-injury, sleep disturbance and suicidal ideas. The patient is not nervous/anxious.    Per HPI unless specifically indicated above     Objective:    BP 135/83 (BP Location: Left Arm, Cuff Size: Normal)   Pulse 76   Temp 98 F (36.7 C) (Oral)   Ht '5\' 11"'$  (1.803 m)   Wt 271 lb 12.8 oz (123.3 kg)   SpO2 93%   BMI 37.91 kg/m   Wt Readings from Last 3 Encounters:  05/03/22 271 lb 12.8 oz (123.3 kg)  03/28/22 275 lb 14.4 oz (125.1 kg)  03/22/22 274 lb 6.4 oz (124.5 kg)    Physical Exam Vitals and nursing note reviewed.  Constitutional:      General: He is awake. He is not in acute distress.    Appearance: He is well-developed and well-groomed. He is morbidly obese. He is not ill-appearing.  HENT:     Head: Normocephalic and atraumatic.  Right Ear: Hearing normal. No drainage.     Left Ear: Hearing normal. No drainage.  Eyes:     General: Lids are normal.        Right eye: No discharge.        Left eye: No discharge.     Conjunctiva/sclera: Conjunctivae normal.     Pupils: Pupils are equal, round, and reactive to light.  Neck:     Vascular: No carotid bruit.     Trachea: Trachea normal.  Cardiovascular:     Rate and Rhythm: Normal rate and regular rhythm.     Heart sounds: Normal heart sounds, S1 normal and S2 normal. No murmur heard.   No gallop.  Pulmonary:     Effort: Pulmonary effort is normal. No accessory muscle usage or respiratory distress.     Breath sounds: Normal breath sounds.  Abdominal:     General: Bowel sounds are  normal.     Palpations: Abdomen is soft.  Musculoskeletal:        General: Normal range of motion.     Cervical back: Normal range of motion and neck supple.     Right lower leg: No edema.     Left lower leg: No edema.  Skin:    General: Skin is warm and dry.     Capillary Refill: Capillary refill takes less than 2 seconds.          Comments: Scattered pale purple bruises bilateral upper extremities noted.  Neurological:     Mental Status: He is alert and oriented to person, place, and time.  Psychiatric:        Attention and Perception: Attention normal.        Mood and Affect: Mood normal.        Speech: Speech normal.        Behavior: Behavior normal. Behavior is cooperative.        Thought Content: Thought content normal.    Results for orders placed or performed in visit on 03/22/22  [id number] 11+Oxyco+Alc+Crt-Bund  Result Value Ref Range   Ethanol CANCELED %   Amphetamines, Urine CANCELED    Barbiturate CANCELED    BENZODIAZ UR QL CANCELED    Cannabinoid Quant, Ur CANCELED    Cocaine (Metabolite) CANCELED    OPIATE SCREEN URINE CANCELED    Oxycodone/Oxymorphone, Urine CANCELED    Phencyclidine CANCELED    Methadone Screen, Urine CANCELED    Propoxyphene CANCELED    Meperidine CANCELED    Tramadol CANCELED       Assessment & Plan:   Problem List Items Addressed This Visit       Musculoskeletal and Integument   Cyst of skin    Left upper arm interior aspect with tenderness.  Will get ultrasound of area to further assess and send to general surgery for removal after imaging returns dependent on findings.         Relevant Orders   [place] LT UPPER EXTREM LTD SOFT TISSUE NON VASCULAR     Other   Morbid obesity ([place]) - Primary    BMI 38.27 with underlying cardiac issues -- will attempt to get Saxenda coverage (insurance would not cover [phone] and offered this as alternative) which would offer benefit to this patient for weight loss and overall health.  No family  history of thyroid cancer and no personal history of pancreatitis.  Educated on this medication at length.  Recommended eating smaller high protein, low fat meals more frequently and exercising 30 mins  a day 5 times a week with a goal of 10-15lb weight loss in the next 3 months. Patient voiced their understanding and motivation to adhere to these recommendations.  Return in beginning of July.        Relevant Medications   Liraglutide -Weight Management (SAXENDA) 18 MG/3ML SOPN     Follow up plan: Return in about 6 weeks (around 06/16/2022) for WEIGHT CHECK.

## 2022-05-12 ENCOUNTER — Ambulatory Visit
Admission: RE | Admit: 2022-05-12 | Discharge: 2022-05-12 | Disposition: A | Discharge status: Home / Self Care | Payer: Medicare Other | Source: Ambulatory Visit | Attending: Nurse Practitioner | Admitting: Nurse Practitioner

## 2022-05-12 DIAGNOSIS — R2232 Localized swelling, mass and lump, left upper limb: Secondary | ICD-10-CM | POA: Diagnosis not present

## 2022-05-12 DIAGNOSIS — L729 Follicular cyst of the skin and subcutaneous tissue, unspecified: Secondary | ICD-10-CM | POA: Diagnosis not present

## 2022-05-15 NOTE — Progress Notes (Signed)
Contacted via MyChart   Good evening Jesse Norton, your imaging returned and overall show no mass or fluid there.  It is noted to have some increased thickness of the the soft tissue in area of concern, we can consider repeat of imaging in upcoming weeks if any worsening symptoms.  For now I would monitor closely and alert me if any changes.:)

## 2022-06-07 ENCOUNTER — Encounter: Payer: Self-pay | Admitting: Internal Medicine

## 2022-06-07 ENCOUNTER — Ambulatory Visit (INDEPENDENT_AMBULATORY_CARE_PROVIDER_SITE_OTHER): Payer: Medicare Other | Admitting: Internal Medicine

## 2022-06-07 VITALS — BP 130/84 | HR 77 | Ht 70.0 in | Wt 271.0 lb

## 2022-06-07 DIAGNOSIS — Z01810 Encounter for preprocedural cardiovascular examination: Secondary | ICD-10-CM | POA: Diagnosis not present

## 2022-06-07 DIAGNOSIS — E785 Hyperlipidemia, unspecified: Secondary | ICD-10-CM

## 2022-06-07 DIAGNOSIS — I5032 Chronic diastolic (congestive) heart failure: Secondary | ICD-10-CM

## 2022-06-07 DIAGNOSIS — I25118 Atherosclerotic heart disease of native coronary artery with other forms of angina pectoris: Secondary | ICD-10-CM

## 2022-06-07 DIAGNOSIS — I10 Essential (primary) hypertension: Secondary | ICD-10-CM

## 2022-06-07 NOTE — Patient Instructions (Signed)
Medication Instructions:   Your physician recommends that you continue on your current medications as directed. Please refer to the Current Medication list given to you today.  *If you need a refill on your cardiac medications before your next appointment, please call your pharmacy*  Please follow instructions below prior to upcoming colonoscopy:  STOP Plavix (clopidogrel) 5 days prior to colonoscopy  TAKE Aspirin 81 mg daily WHILE holding Plavix  AFTER you resume Plavix 75 mg daily, STOP taking Aspirin  Lab Work:  None ordered  Testing/Procedures:  None ordered   Follow-Up: At [facility], you and your health needs are our priority.  As part of our continuing mission to provide you with exceptional heart care, we have created designated Provider Care Teams.  These Care Teams include your primary Cardiologist (physician) and Advanced Practice Providers (APPs -  Physician Assistants and Nurse Practitioners) who all work together to provide you with the care you need, when you need it.  We recommend signing up for the patient portal called "MyChart".  Sign up information is provided on this After Visit Summary.  MyChart is used to connect with patients for Virtual Visits (Telemedicine).  Patients are able to view lab/test results, encounter notes, upcoming appointments, etc.  Non-urgent messages can be sent to your provider as well.   To learn more about what you can do with MyChart, go to [email or web address].    Your next appointment:   6 month(s)  The format for your next appointment:   In Person  Provider:   You may see Nelva Bush, MD or one of the following Advanced Practice Providers on your designated Care Team:   Murray Hodgkins, NP Christell Faith, PA-C Cadence Kathlen Mody, PA-C{   Important Information About Sugar

## 2022-06-07 NOTE — Progress Notes (Signed)
Follow-up Outpatient Visit Date: 06/07/2022  Primary Care Provider: Venita Lick, NP [place] [address]  Chief Complaint: Follow-up CAD and HFpEF  HPI:  Jesse Norton is a 76 y.o. male with history of coronary artery disease status post PCI to LCx and LAD, HFpEF, hypertension, Barrett's esophagus, MALT-lymphoma of colon, gout, BPH, and obesity, who presents for follow-up of coronary artery disease and HFpEF.  I last saw him in 11/2021, at which time he was doing well.  Blood pressure was quite elevated in our office at 160/84, though it improved to 136/88 on recheck.  We agreed to defer medication changes and to continue working on lifestyle modifications.  He is scheduled for colonoscopy next month.  Today, Jesse Norton is feeling well.  He has been trying to lose weight and is following the weight watchers diet.  His PCP recently prescribed Wegovy, though this was not approved by his insurance.  His main limitation is continued bilateral hip pain.  He denies chest pain, shortness of breath, palpitations, lightheadedness, and edema.  Home blood pressures are typically 135-140/78-84.   --------------------------------------------------------------------------------------------------  Cardiovascular History & Procedures: Cardiovascular Problems: Coronary artery disease HFpEF   Risk Factors: Known CAD, hypertension, male gender, obesity, and age greater than 88   Cath/PCI: PCI (02/03/2019): Severe, heavily calcified mid LAD stenosis (80-90%), unchanged from diagnostic catheterization earlier this month. Widely patent LCx stent. Normal left ventricular filling pressure. Successful orbital atherectomy and IVUS-guided PCI to the mid LAD using a Resolute Onyx 3.0 x 15 mm drug-eluting stent (postdilated to 3.6 mm) with 0% residual stenosis and TIMI-3 flow.   LHC/PCI (01/21/2019): Significant 2-vessel coronary artery disease, including 80-90% mid LAD stenosis with heavy  calcification, as well as 70% proximal LCx stenosis. Normal left ventricular systolic function. Moderately elevated left ventricular filling pressure. Successful PCI to proximal LCx using Resolute Onyx 2.75 x 15 mm drug-eluting stent (postdilated to 3.1 mm proximally) with 0% residual stenosis and TIMI-3 flow.   CV Surgery: None   EP Procedures and Devices: None   Non-Invasive Evaluation(s): Cardiac CTA (12/23/2018): Multivessel CAD with hemodynamically significant disease involving the mid LAD and distal LCx. Exercise tolerance test (11/06/2018): Intermediate risk study with 1 mm horizontal ST depressions and frequent PVCs during stress.  Recent CV Pertinent Labs: Lab Results  Component Value Date   CHOL 142 09/15/2021   CHOL 124 02/12/2019   HDL 60 09/15/2021   LDLCALC 65 09/15/2021   TRIG 91 09/15/2021   TRIG 58 02/12/2019   CHOLHDL 1.9 08/25/2020   K 4.3 09/15/2021   K 4.0 01/06/2014   MG 1.5 (L) 09/15/2021   BUN 20 09/15/2021   BUN 22 (H) 01/06/2014   CREATININE 0.94 09/15/2021   CREATININE 1.13 01/06/2014    Past medical and surgical history were reviewed and updated in EPIC.  Current Meds  Medication Sig   allopurinol (ZYLOPRIM) 300 MG tablet Take 1 tablet (300 mg total) by mouth 3 (three) times a week.   amLODipine (NORVASC) 10 MG tablet TAKE 1 TABLET(10 MG) BY MOUTH DAILY   atorvastatin (LIPITOR) 40 MG tablet Take 1 tablet (40 mg total) by mouth daily.   clobetasol ointment (TEMOVATE) 0.05 % Apply topically as needed.   clopidogrel (PLAVIX) 75 MG tablet Take 1 tablet (75 mg total) by mouth daily with breakfast.   furosemide (LASIX) 20 MG tablet Take 1 tablet (20 mg total) by mouth daily.   Liraglutide -Weight Management (SAXENDA) 18 MG/3ML SOPN Start out with  inject 0.6 mg into the skin daily, then increase by 0.6 mg daily at weekly intervals to a target dose of 3 mg once daily   LORazepam (ATIVAN) 1 MG tablet Take 0.5-1 tablets (0.5-1 mg total) by mouth daily as  needed for anxiety.   losartan (COZAAR) 100 MG tablet TAKE 1 TABLET(100 MG) BY MOUTH DAILY   metoprolol tartrate (LOPRESSOR) 25 MG tablet TAKE 1 TABLET(25 MG) BY MOUTH TWICE DAILY   nitroGLYCERIN (NITROSTAT) 0.4 MG SL tablet Place 1 tablet (0.4 mg total) under the tongue every 5 (five) minutes as needed for chest pain. Maximum of 3 doses.   OVER THE COUNTER MEDICATION AGI (agricultural greens) powder form by mouth once daily   pantoprazole (PROTONIX) 20 MG tablet Take 1 tablet (20 mg total) by mouth 2 (two) times daily.   sertraline (ZOLOFT) 50 MG tablet Take 1 tablet (50 mg total) by mouth daily.    Allergies: Patient has no known allergies.  Social History   Tobacco Use   Smoking status: Never   Smokeless tobacco: Never  Vaping Use   Vaping Use: Never used  Substance Use Topics   Alcohol use: Yes    Alcohol/week: 5.0 standard drinks of alcohol    Types: 5 Standard drinks or equivalent per week    Comment: moderate-"a couple of glasses of wine each night"   Drug use: No    Family History  Adopted: Yes  Problem Relation Age of Onset   Heart attack Mother 47   Pancreatic cancer Father        died in WWII - pt adopted   Hypertension Sister    Hyperlipidemia Sister    Hypertension Brother    Hyperlipidemia Brother    Prostate cancer Neg Hx    Kidney cancer Neg Hx    Bladder Cancer Neg Hx     Review of Systems: A 12-system review of systems was performed and was negative except as noted in the HPI.  --------------------------------------------------------------------------------------------------  Physical Exam: BP 130/84 (BP Location: Left Arm, Patient Position: Sitting, Cuff Size: Large)   Pulse 77   Ht '5\' 10"'$  (1.778 m)   Wt 271 lb (122.9 kg)   SpO2 97%   BMI 38.88 kg/m   General:  NAD. Neck: No JVD or HJR. Lungs: Clear to auscultation bilaterally without wheezes or crackles. Heart: Regular rate and rhythm without murmurs, rubs, or gallops. Abdomen: Soft,  nontender, nondistended. Extremities: No lower extremity edema.  EKG: Normal sinus rhythm with nonspecific T wave changes.  No significant change from prior tracing on 08/31/2021.  Lab Results  Component Value Date   WBC 7.0 09/15/2021   HGB 14.2 09/15/2021   HCT 42.1 09/15/2021   MCV 103 (H) 09/15/2021   PLT 295 09/15/2021    Lab Results  Component Value Date   NA 141 09/15/2021   K 4.3 09/15/2021   CL 98 09/15/2021   CO2 27 09/15/2021   BUN 20 09/15/2021   CREATININE 0.94 09/15/2021   GLUCOSE 102 (H) 09/15/2021   ALT 28 09/15/2021    Lab Results  Component Value Date   CHOL 142 09/15/2021   HDL 60 09/15/2021   LDLCALC 65 09/15/2021   TRIG 91 09/15/2021   CHOLHDL 1.9 08/25/2020    --------------------------------------------------------------------------------------------------  ASSESSMENT AND PLAN: Coronary artery disease with stable angina: Jesse Norton continues to do well without recurrent chest pain.  Continue secondary prevention with atorvastatin and clopidogrel.  Continue amlodipine and metoprolol for antianginal therapy and blood pressure  control.  Chronic HFpEF: Jesse Norton appears euvolemic on exam today.  Continue furosemide 20 mg daily as well as blood pressure control with amlodipine, losartan, and metoprolol.  Hypertension: Blood pressures upper normal.  Continue current medications as well as lifestyle modifications.  Hyperlipidemia: Lipids reasonably well controlled on last check in 09/2021.  Continue atorvastatin 40 mg daily as well as lifestyle modifications.  Preoperative cardiovascular risk assessment: Jesse Norton for colonoscopy next month.  Given his history of multivessel PCI, I would recommend discontinuation of clopidogrel 5 days prior to colonoscopy and initiation of aspirin 81 mg daily during the perioperative period.  When cleared by GI, he can transition back from aspirin to clopidogrel.  As he does not have any unstable cardiac symptoms,  he can proceed with this low risk procedure without further cardiac testing/intervention.  Follow-up: Return to clinic in 6 months.  Nelva Bush, MD 06/07/2022 2:28 PM

## 2022-06-08 ENCOUNTER — Telehealth: Payer: Self-pay | Admitting: Internal Medicine

## 2022-06-08 NOTE — Telephone Encounter (Signed)
   Pre-operative Risk Assessment    Patient Name: Jesse Norton  DOB: 16-Apr-1946 MRN: [id number]      Request for Surgical Clearance    Procedure:   COLONOSCOPY/EGD   Date of Surgery:  Clearance 06/19/22                                 Surgeon:  COLONOSCOPY/EGD Surgeon's Group or Practice Name:  [facility] Phone number:  [phone] Fax number:  [phone]  Type of Clearance Requested:   - Pharmacy:  Hold Clopidogrel (Plavix)     Type of Anesthesia:  Not Indicated   Additional requests/questions:    [name]   06/08/2022, 9:56 AM

## 2022-06-08 NOTE — Telephone Encounter (Signed)
Refaxed surgical clearance to [phone].

## 2022-06-08 NOTE — Telephone Encounter (Signed)
   Jesse Norton called requesting to refax clearance and provide the fax# [phone]

## 2022-06-08 NOTE — Telephone Encounter (Signed)
   Name: Jesse Norton  DOB: 1946-05-21  MRN: [id number]   Primary Cardiologist: Nelva Bush, MD  Chart reviewed as part of pre-operative protocol coverage.  Jesse Norton was last seen on 06/07/2022 by Dr. Saunders Revel.  He was doing well at that appointment.  Per Dr. Darnelle Bos documentation yesterday: Jesse Norton for colonoscopy next month.  Given his history of multivessel PCI, I would recommend discontinuation of clopidogrel 5 days prior to colonoscopy and initiation of aspirin 81 mg daily during the perioperative period.  When cleared by GI, he can transition back from aspirin to clopidogrel.  As he does not have any unstable cardiac symptoms, he can proceed with this low risk procedure without further cardiac testing/intervention.  I will route this recommendation to the requesting party via Epic fax function and remove from pre-op pool. Please call with questions.  Elgie Collard, PA-C 06/08/2022, 11:13 AM

## 2022-06-11 NOTE — Patient Instructions (Signed)

## 2022-06-14 ENCOUNTER — Ambulatory Visit (INDEPENDENT_AMBULATORY_CARE_PROVIDER_SITE_OTHER): Payer: Medicare Other | Admitting: Nurse Practitioner

## 2022-06-14 ENCOUNTER — Encounter: Payer: Self-pay | Admitting: Nurse Practitioner

## 2022-06-14 DIAGNOSIS — Z6837 Body mass index (BMI) 37.0-37.9, adult: Secondary | ICD-10-CM | POA: Diagnosis not present

## 2022-06-14 DIAGNOSIS — R7303 Prediabetes: Secondary | ICD-10-CM | POA: Diagnosis not present

## 2022-06-14 DIAGNOSIS — Z6833 Body mass index (BMI) 33.0-33.9, adult: Secondary | ICD-10-CM | POA: Insufficient documentation

## 2022-06-14 MED ORDER — OZEMPIC (0.25 OR 0.5 MG/DOSE) 2 MG/1.5ML ~~LOC~~ SOPN: PEN_INJECTOR | SUBCUTANEOUS | 4 refills | Status: DC

## 2022-06-14 NOTE — Assessment & Plan Note (Signed)
Refer to morbid obesity plan of care. 

## 2022-06-14 NOTE — Assessment & Plan Note (Addendum)
BMI 37.91 with underlying cardiac issues and prediabetes -- will attempt to get Ozempic coverage to assist with prediabetes and weight.  No family history of thyroid cancer and no personal history of pancreatitis.  Educated on this medication at length.  Recommended eating smaller high protein, low fat meals more frequently and exercising 30 mins a day 5 times a week with a goal of 10-15lb weight loss in the next 3 months. Patient voiced their understanding and motivation to adhere to these recommendations.  Return in 6 weeks.

## 2022-06-14 NOTE — Progress Notes (Signed)
BP 134/77 (BP Location: Left Arm, Cuff Size: Normal)   Pulse 71   Temp 98.2 F (36.8 C) (Oral)   Ht '5\' 10"'$  (1.778 m)   Wt 264 lb 3.2 oz (119.8 kg)   SpO2 93%   BMI 37.91 kg/m    Subjective:    Patient ID: Jesse Norton, male    DOB: July 26, 1946, 76 y.o.   MRN: [id number]  HPI: Jesse Norton is a 76 y.o. male  Chief Complaint  Patient presents with   Weight Check    Patient is here for a Weight Check. Patient says [employer] nor Medicare will not fill the prescription. Patient says he never started the medication.    OBESITY: Presents today for follow-up on weight, ordered Wegovy last visit but Medicare not covering at this time.  Tried to change to [place], but this also was not covered.  He has underlying HF and HTN + HLD, history of elevations in A1c.  Would benefit from medication to assist with weight loss.  Recent A1c in October 5.9%.  Has been on Weight Watchers, been going well -- started when he was denied Wegovy.  Has lost 7 lbs at this time, but is still interested in assistance with weight loss.  Is exercising daily, frequent walking and hiking.  He is working on diet changes, no fried food and reduced bread intake.  Would like to get to 220 lbs.  No family history of thyroid cancer or personal history of pancreatitis.    Relevant past medical, surgical, family and social history reviewed and updated as indicated. Interim medical history since our last visit reviewed. Allergies and medications reviewed and updated.  Review of Systems  Constitutional:  Negative for activity change, diaphoresis, fatigue and fever.  Respiratory:  Negative for cough, chest tightness, shortness of breath and wheezing.   Cardiovascular:  Negative for chest pain, palpitations and leg swelling.  Gastrointestinal: Negative.   Neurological: Negative.   Psychiatric/Behavioral:  Negative for decreased concentration, self-injury, sleep disturbance and suicidal ideas. The patient is not  nervous/anxious.     Per HPI unless specifically indicated above     Objective:    BP 134/77 (BP Location: Left Arm, Cuff Size: Normal)   Pulse 71   Temp 98.2 F (36.8 C) (Oral)   Ht '5\' 10"'$  (1.778 m)   Wt 264 lb 3.2 oz (119.8 kg)   SpO2 93%   BMI 37.91 kg/m   Wt Readings from Last 3 Encounters:  06/14/22 264 lb 3.2 oz (119.8 kg)  06/07/22 271 lb (122.9 kg)  05/03/22 271 lb 12.8 oz (123.3 kg)    Physical Exam Vitals and nursing note reviewed.  Constitutional:      General: He is awake. He is not in acute distress.    Appearance: He is well-developed and well-groomed. He is morbidly obese. He is not ill-appearing.  HENT:     Head: Normocephalic and atraumatic.     Right Ear: Hearing normal. No drainage.     Left Ear: Hearing normal. No drainage.  Eyes:     General: Lids are normal.        Right eye: No discharge.        Left eye: No discharge.     Conjunctiva/sclera: Conjunctivae normal.     Pupils: Pupils are equal, round, and reactive to light.  Neck:     Vascular: No carotid bruit.     Trachea: Trachea normal.  Cardiovascular:     Rate and  Rhythm: Normal rate and regular rhythm.     Heart sounds: Normal heart sounds, S1 normal and S2 normal. No murmur heard.    No gallop.  Pulmonary:     Effort: Pulmonary effort is normal. No accessory muscle usage or respiratory distress.     Breath sounds: Normal breath sounds.  Abdominal:     General: Bowel sounds are normal.     Palpations: Abdomen is soft.  Musculoskeletal:        General: Normal range of motion.     Cervical back: Normal range of motion and neck supple.     Right lower leg: No edema.     Left lower leg: No edema.  Skin:    General: Skin is warm and dry.     Capillary Refill: Capillary refill takes less than 2 seconds.     Comments: Scattered pale purple bruises bilateral upper extremities noted.  Neurological:     Mental Status: He is alert and oriented to person, place, and time.  Psychiatric:         Attention and Perception: Attention normal.        Mood and Affect: Mood normal.        Speech: Speech normal.        Behavior: Behavior normal. Behavior is cooperative.        Thought Content: Thought content normal.    Results for orders placed or performed in visit on 03/22/22  [id number]+Oxyco+Alc+Crt-Bund  Result Value Ref Range   Ethanol CANCELED %   Amphetamines, Urine CANCELED    Barbiturate CANCELED    BENZODIAZ UR QL CANCELED    Cannabinoid Quant, Ur CANCELED    Cocaine (Metabolite) CANCELED    OPIATE SCREEN URINE CANCELED    Oxycodone/Oxymorphone, Urine CANCELED    Phencyclidine CANCELED    Methadone Screen, Urine CANCELED    Propoxyphene CANCELED    Meperidine CANCELED    Tramadol CANCELED       Assessment & Plan:   Problem List Items Addressed This Visit       Other   BMI 37.0-37.9, adult    Refer to morbid obesity plan of care.      Morbid obesity ([place]) - Primary    BMI 37.91 with underlying cardiac issues and prediabetes -- will attempt to get Ozempic coverage to assist with prediabetes and weight.  No family history of thyroid cancer and no personal history of pancreatitis.  Educated on this medication at length.  Recommended eating smaller high protein, low fat meals more frequently and exercising 30 mins a day 5 times a week with a goal of 10-15lb weight loss in the next 3 months. Patient voiced their understanding and motivation to adhere to these recommendations.  Return in 6 weeks.       Relevant Medications   Semaglutide,0.25 or 0.'5MG'$ /DOS, (OZEMPIC, 0.25 OR 0.5 MG/DOSE,) 2 MG/1.5ML SOPN   Prediabetes    5.9% on recent labs, will see if Ozempic will be covered, which would benefit both sugar levels and weight management.  Discussed with patient.  Lab recheck next visit.        Follow up plan: Return in about 6 weeks (around 07/26/2022) for WEIGHT CHECK, HTN/HLD, MOOD.

## 2022-06-14 NOTE — Assessment & Plan Note (Signed)
5.9% on recent labs, will see if Ozempic will be covered, which would benefit both sugar levels and weight management.  Discussed with patient.  Lab recheck next visit.

## 2022-06-18 ENCOUNTER — Encounter: Payer: Self-pay | Admitting: Gastroenterology

## 2022-06-18 NOTE — H&P (Signed)
Pre-Procedure H&P   Patient ID: Jesse Norton is a 76 y.o. male.  Gastroenterology Provider: Annamaria Helling, DO  Referring Provider: Laurine Blazer, PA PCP: Venita Lick, NP  Date: 06/18/2022  HPI Jesse Norton is a 76 y.o. male who presents today for Esophagogastroduodenoscopy and Colonoscopy for Barrett's esophagus surveillance; personal history of colon polyps-surveillance.  Patient on aspirin and Plavix which has been held 5 days prior to the procedure.  Cardiology has reviewed and evaluated the patient noted him to be low risk for elective procedure.  Patient has a history of Barrett's esophagus without dysplasia extending for 3 cm.  Has a history of H. pylori and gastric ulcers as well as a duodenal adenoma.  Also history of internal hemorrhoids.  He is currently taking Protonix 20 mg twice a day.  This controls his reflux he denies any dysphagia odynophagia appetite changes or unexpected weight loss.  Status post cardiac cath with PCI in 2020 with stent placement  Father with history of pancreatic cancer  Patient is status post splenectomy  EGD/Colonoscopy 12/2018- gastritis with hyperplastic gastric polyps.  Short segment Barrett's esophagus without dysplasia.  Diverticulosis was noted along with 3 tubular adenomas 1 SSP.  Most recent lab work in October 2020 with hemoglobin of 14 MCV 103 platelets 295,000 creatinine 0.94   Past Medical History:  Diagnosis Date   (HFpEF) heart failure with preserved ejection fraction ([place])    a. 01/2019 Cath: EF 55-60% w/ elevated LVEDP.   Anxiety    Barrett's esophagus    CAD (coronary artery disease)    a. 10/2018 ETT: 58m horizontal inflat ST depression, freq PVC's->Intermediate; b. 12/2018 Cardiac CTA: LM nl, LAD FFRct 0.6 in mLAD, LCX FFRct 0.99p, 0.882m0.77d, RCA no signif dzs, FFRct 0.89d->Rec cath; c. 01/2019 PCI: LM nl, LAD 40p, 8584m2 70, LCX 70p (2.75x15 Resolute Onyx DES), RCA min irregs. EF 55-65%. EDP  25-15m64m d. 01/2019 PCI: LAD 80-90 (atherectomy & 3x15 Resolute Onyx DES).   Colon polyps    Duodenal adenoma    Gastritis    GERD (gastroesophageal reflux disease)    Gout    H. pylori infection    H/O urethral stricture    Hemorrhoids    Hiatal hernia    Hypertension    Stomach ulcer     Past Surgical History:  Procedure Laterality Date   CARDIAC CATHETERIZATION     CATARACT EXTRACTION W/PHACO Left 03/07/2022   Procedure: CATARACT EXTRACTION PHACO AND INTRAOCULAR LENS PLACEMENT (IOC)[place] 10.41 00:55.7;  Surgeon: PorfBirder Robson;  [facility];  Laterality: Left;   CATARACT EXTRACTION W/PHACO Right 03/28/2022   Procedure: CATARACT EXTRACTION PHACO AND INTRAOCULAR LENS PLACEMENT (IOC)[place] 6.68 00:50.0;  Surgeon: PorfBirder Robson;  [facility] [place];  Laterality: Right;   COLONOSCOPY     COLONOSCOPY WITH PROPOFOL N/A 08/20/2018   Procedure: COLONOSCOPY WITH PROPOFOL;  Surgeon: SkulLollie Sails;  [facility];  Service: Endoscopy;  Laterality: N/A;   COLONOSCOPY WITH PROPOFOL N/A 12/19/2018   Procedure: COLONOSCOPY WITH PROPOFOL;  Surgeon: SkulLollie Sails;  [facility];  Service: Endoscopy;  Laterality: N/A;   CORONARY ATHERECTOMY N/A 02/03/2019   Procedure: CORONARY ATHERECTOMY;  Surgeon: End,Nelva Bush;  [facility];  Service: Cardiovascular;  Laterality: N/A;   CORONARY STENT INTERVENTION N/A 01/21/2019   Procedure: CORONARY STENT INTERVENTION;  Surgeon: End,  Harrell Gave, MD;  Location: [place] CV LAB;  Service: Cardiovascular;  Laterality: N/A;   CORONARY STENT INTERVENTION N/A 02/03/2019   Procedure: CORONARY STENT INTERVENTION;  Surgeon: Nelva Bush, MD;  Location: [place] CV LAB;  Service: Cardiovascular;  Laterality: N/A;   CYSTOSCOPY WITH URETHRAL DILATATION N/A 08/16/2018    Procedure: CYSTOSCOPY WITH URETHRAL DILATATION;  Surgeon: [name], MD;  Location: [facility] ORS;  Service: Urology;  Laterality: N/A;   ESOPHAGOGASTRODUODENOSCOPY N/A 01/29/2017   Procedure: ESOPHAGOGASTRODUODENOSCOPY (EGD);  Surgeon: Lollie Sails, MD;  Location: [facility] ENDOSCOPY;  Service: Endoscopy;  Laterality: N/A;   ESOPHAGOGASTRODUODENOSCOPY (EGD) WITH PROPOFOL N/A 07/10/2016   Procedure: ESOPHAGOGASTRODUODENOSCOPY (EGD) WITH PROPOFOL;  Surgeon: Lollie Sails, MD;  Location: [facility] ENDOSCOPY;  Service: Endoscopy;  Laterality: N/A;   ESOPHAGOGASTRODUODENOSCOPY (EGD) WITH PROPOFOL N/A 12/19/2018   Procedure: ESOPHAGOGASTRODUODENOSCOPY (EGD) WITH PROPOFOL;  Surgeon: Lollie Sails, MD;  Location: [facility] ENDOSCOPY;  Service: Endoscopy;  Laterality: N/A;   gastritis     h.pylori     INTRAVASCULAR ULTRASOUND/IVUS N/A 02/03/2019   Procedure: Intravascular Ultrasound/IVUS;  Surgeon: Nelva Bush, MD;  Location: [place] CV LAB;  Service: Cardiovascular;  Laterality: N/A;   left elbow repair Left    LEFT HEART CATH AND CORONARY ANGIOGRAPHY Left 01/21/2019   Procedure: LEFT HEART CATH AND CORONARY ANGIOGRAPHY;  Surgeon: Nelva Bush, MD;  Location: [place] CV LAB;  Service: Cardiovascular;  Laterality: Left;   SPLENECTOMY  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  HENT:  Negative for trouble swallowing and voice change.   Respiratory:  Negative for shortness of breath and wheezing.   Cardiovascular:  Negative for chest pain, palpitations and leg swelling.  Gastrointestinal:  Negative for abdominal distention, abdominal pain, anal bleeding, blood in stool, constipation, diarrhea, nausea and vomiting.  Musculoskeletal:  Negative for arthralgias and myalgias.   Skin:  Negative for color change and pallor.  Neurological:  Negative for dizziness, syncope and weakness.  Psychiatric/Behavioral:  Negative for confusion. The patient is not nervous/anxious.   All other systems reviewed and are negative.    Medications No current facility-administered medications on file prior to encounter.   Current Outpatient Medications on File Prior to Encounter  Medication Sig Dispense Refill   losartan-hydrochlorothiazide (HYZAAR) 100-12.5 MG tablet Take 1 tablet by mouth daily.     amLODipine (NORVASC) 10 MG tablet TAKE 1 TABLET(10 MG) BY MOUTH DAILY 90 tablet 4   atorvastatin (LIPITOR) 40 MG tablet Take 1 tablet (40 mg total) by mouth daily. 90 tablet 4   clobetasol ointment (TEMOVATE) 0.05 % Apply topically as needed.     clopidogrel (PLAVIX) 75 MG tablet Take 1 tablet (75 mg total) by mouth daily with breakfast. 90 tablet 4   furosemide (LASIX) 20 MG tablet Take 1 tablet (20 mg total) by mouth daily. 90 tablet 2   losartan (COZAAR) 100 MG tablet TAKE 1 TABLET(100 MG) BY MOUTH DAILY 90 tablet 4   metoprolol tartrate (LOPRESSOR) 25 MG tablet TAKE 1 TABLET(25 MG) BY MOUTH TWICE DAILY 180 tablet 4   nitroGLYCERIN (NITROSTAT) 0.4 MG SL tablet Place 1 tablet (0.4 mg total) under the tongue every 5 (five) minutes as needed for chest pain. Maximum of 3 doses. 25 tablet 3   OVER THE COUNTER MEDICATION AGI (agricultural greens) powder  form by mouth once daily      Pertinent medications related to GI and procedure were reviewed by me with the patient prior to the procedure  No current facility-administered medications for this encounter.  Current Outpatient Medications:    losartan-hydrochlorothiazide (HYZAAR) 100-12.5 MG tablet, Take 1 tablet by mouth daily., Disp: , Rfl:    allopurinol (ZYLOPRIM) 300 MG tablet, Take 1 tablet (300 mg total) by mouth 3 (three) times a week., Disp: 90 tablet, Rfl: 4   amLODipine (NORVASC) 10 MG tablet, TAKE 1 TABLET(10 MG) BY MOUTH  DAILY, Disp: 90 tablet, Rfl: 4   atorvastatin (LIPITOR) 40 MG tablet, Take 1 tablet (40 mg total) by mouth daily., Disp: 90 tablet, Rfl: 4   clobetasol ointment (TEMOVATE) 0.05 %, Apply topically as needed., Disp: , Rfl:    clopidogrel (PLAVIX) 75 MG tablet, Take 1 tablet (75 mg total) by mouth daily with breakfast., Disp: 90 tablet, Rfl: 4   furosemide (LASIX) 20 MG tablet, Take 1 tablet (20 mg total) by mouth daily., Disp: 90 tablet, Rfl: 2   LORazepam (ATIVAN) 1 MG tablet, Take 0.5-1 tablets (0.5-1 mg total) by mouth daily as needed for anxiety., Disp: 30 tablet, Rfl: 1   losartan (COZAAR) 100 MG tablet, TAKE 1 TABLET(100 MG) BY MOUTH DAILY, Disp: 90 tablet, Rfl: 4   metoprolol tartrate (LOPRESSOR) 25 MG tablet, TAKE 1 TABLET(25 MG) BY MOUTH TWICE DAILY, Disp: 180 tablet, Rfl: 4   nitroGLYCERIN (NITROSTAT) 0.4 MG SL tablet, Place 1 tablet (0.4 mg total) under the tongue every 5 (five) minutes as needed for chest pain. Maximum of 3 doses., Disp: 25 tablet, Rfl: 3   OVER THE COUNTER MEDICATION, AGI (agricultural greens) powder form by mouth once daily, Disp: , Rfl:    pantoprazole (PROTONIX) 20 MG tablet, Take 1 tablet (20 mg total) by mouth 2 (two) times daily., Disp: 180 tablet, Rfl: 4   Semaglutide,0.25 or 0.'5MG'$ /DOS, (OZEMPIC, 0.25 OR 0.5 MG/DOSE,) 2 MG/1.5ML SOPN, Start with 0.25 MG into skin once a week x 4 weeks, then increase to 0.5 MG into the skin weekly., Disp: 1.5 mL, Rfl: 4   sertraline (ZOLOFT) 50 MG tablet, Take 1 tablet (50 mg total) by mouth daily., Disp: 90 tablet, Rfl: 4      No Known Allergies Allergies were reviewed by me prior to the procedure  Objective   There is no height or weight on file to calculate BMI. There were no vitals filed for this visit.  *** Physical Exam Vitals and nursing note reviewed.  Constitutional:      General: He is not in acute distress.    Appearance: Normal appearance. He is obese. He is not ill-appearing, toxic-appearing or diaphoretic.   HENT:     Head: Normocephalic and atraumatic.     Nose: Nose normal.     Mouth/Throat:     Mouth: Mucous membranes are moist.     Pharynx: Oropharynx is clear.  Eyes:     General: No scleral icterus.    Extraocular Movements: Extraocular movements intact.  Cardiovascular:     Rate and Rhythm: Normal rate and regular rhythm.     Heart sounds: Normal heart sounds. No murmur heard.    No friction rub. No gallop.  Pulmonary:     Effort: Pulmonary effort is normal. No respiratory distress.     Breath sounds: Normal breath sounds. No wheezing, rhonchi or rales.  Abdominal:     General: Bowel sounds are normal. There is no distension.  Palpations: Abdomen is soft.     Tenderness: There is no abdominal tenderness. There is no guarding or rebound.  Musculoskeletal:     Cervical back: Neck supple.     Right lower leg: No edema.     Left lower leg: No edema.  Skin:    General: Skin is warm and dry.     Coloration: Skin is not jaundiced or pale.  Neurological:     General: No focal deficit present.     Mental Status: He is alert and oriented to person, place, and time. Mental status is at baseline.  Psychiatric:        Mood and Affect: Mood normal.        Behavior: Behavior normal.        Thought Content: Thought content normal.        Judgment: Judgment normal.      Assessment:  Jesse Norton is a 76 y.o. male  who presents today for Esophagogastroduodenoscopy and Colonoscopy for Barrett's esophagus surveillance; personal history of colon polyps-surveillance.  Plan:  Esophagogastroduodenoscopy and Colonoscopy with possible intervention today  Esophagogastroduodenoscopy and Colonoscopy with possible biopsy, control of bleeding, polypectomy, and interventions as necessary has been discussed with the patient/patient representative. Informed consent was obtained from the patient/patient representative after explaining the indication, nature, and risks of the procedure  including but not limited to death, bleeding, perforation, missed neoplasm/lesions, cardiorespiratory compromise, and reaction to medications. Opportunity for questions was given and appropriate answers were provided. Patient/patient representative has verbalized understanding is amenable to undergoing the procedure.   Annamaria Helling, DO  [facility]  Portions of the record may have been created with voice recognition software. Occasional wrong-word or 'sound-a-like' substitutions may have occurred due to the inherent limitations of voice recognition software.  Read the chart carefully and recognize, using context, where substitutions may have occurred.

## 2022-06-19 ENCOUNTER — Ambulatory Visit: Payer: Medicare Other | Admitting: Registered Nurse

## 2022-06-19 ENCOUNTER — Encounter
Admission: RE | Disposition: A | Discharge status: Home / Self Care | Payer: Self-pay | Source: Home / Self Care | Attending: Gastroenterology

## 2022-06-19 ENCOUNTER — Encounter: Payer: Self-pay | Admitting: Gastroenterology

## 2022-06-19 ENCOUNTER — Ambulatory Visit
Admission: RE | Admit: 2022-06-19 | Discharge: 2022-06-19 | Disposition: A | Discharge status: Home / Self Care | Payer: Medicare Other | Attending: Gastroenterology | Admitting: Gastroenterology

## 2022-06-19 DIAGNOSIS — K649 Unspecified hemorrhoids: Secondary | ICD-10-CM | POA: Diagnosis not present

## 2022-06-19 DIAGNOSIS — Z86010 Personal history of colonic polyps: Secondary | ICD-10-CM | POA: Insufficient documentation

## 2022-06-19 DIAGNOSIS — K2270 Barrett's esophagus without dysplasia: Secondary | ICD-10-CM | POA: Insufficient documentation

## 2022-06-19 DIAGNOSIS — Z09 Encounter for follow-up examination after completed treatment for conditions other than malignant neoplasm: Secondary | ICD-10-CM | POA: Diagnosis not present

## 2022-06-19 DIAGNOSIS — I2510 Atherosclerotic heart disease of native coronary artery without angina pectoris: Secondary | ICD-10-CM | POA: Insufficient documentation

## 2022-06-19 DIAGNOSIS — Z8619 Personal history of other infectious and parasitic diseases: Secondary | ICD-10-CM | POA: Diagnosis not present

## 2022-06-19 DIAGNOSIS — K635 Polyp of colon: Secondary | ICD-10-CM | POA: Insufficient documentation

## 2022-06-19 DIAGNOSIS — K2289 Other specified disease of esophagus: Secondary | ICD-10-CM | POA: Diagnosis not present

## 2022-06-19 DIAGNOSIS — D122 Benign neoplasm of ascending colon: Secondary | ICD-10-CM | POA: Diagnosis not present

## 2022-06-19 DIAGNOSIS — Z800 Family history of malignant neoplasm of digestive organs: Secondary | ICD-10-CM | POA: Insufficient documentation

## 2022-06-19 DIAGNOSIS — K5730 Diverticulosis of large intestine without perforation or abscess without bleeding: Secondary | ICD-10-CM | POA: Diagnosis not present

## 2022-06-19 DIAGNOSIS — I739 Peripheral vascular disease, unspecified: Secondary | ICD-10-CM | POA: Insufficient documentation

## 2022-06-19 DIAGNOSIS — D123 Benign neoplasm of transverse colon: Secondary | ICD-10-CM | POA: Diagnosis not present

## 2022-06-19 DIAGNOSIS — K644 Residual hemorrhoidal skin tags: Secondary | ICD-10-CM | POA: Insufficient documentation

## 2022-06-19 DIAGNOSIS — K449 Diaphragmatic hernia without obstruction or gangrene: Secondary | ICD-10-CM | POA: Diagnosis not present

## 2022-06-19 DIAGNOSIS — K219 Gastro-esophageal reflux disease without esophagitis: Secondary | ICD-10-CM | POA: Diagnosis not present

## 2022-06-19 DIAGNOSIS — I110 Hypertensive heart disease with heart failure: Secondary | ICD-10-CM | POA: Diagnosis not present

## 2022-06-19 DIAGNOSIS — D131 Benign neoplasm of stomach: Secondary | ICD-10-CM | POA: Diagnosis not present

## 2022-06-19 DIAGNOSIS — Z955 Presence of coronary angioplasty implant and graft: Secondary | ICD-10-CM | POA: Insufficient documentation

## 2022-06-19 DIAGNOSIS — G4730 Sleep apnea, unspecified: Secondary | ICD-10-CM | POA: Insufficient documentation

## 2022-06-19 DIAGNOSIS — K317 Polyp of stomach and duodenum: Secondary | ICD-10-CM | POA: Diagnosis not present

## 2022-06-19 DIAGNOSIS — I5032 Chronic diastolic (congestive) heart failure: Secondary | ICD-10-CM | POA: Diagnosis not present

## 2022-06-19 DIAGNOSIS — Z7982 Long term (current) use of aspirin: Secondary | ICD-10-CM | POA: Diagnosis not present

## 2022-06-19 DIAGNOSIS — Z7902 Long term (current) use of antithrombotics/antiplatelets: Secondary | ICD-10-CM | POA: Insufficient documentation

## 2022-06-19 DIAGNOSIS — K641 Second degree hemorrhoids: Secondary | ICD-10-CM | POA: Diagnosis not present

## 2022-06-19 DIAGNOSIS — Z8711 Personal history of peptic ulcer disease: Secondary | ICD-10-CM | POA: Diagnosis not present

## 2022-06-19 DIAGNOSIS — Z1211 Encounter for screening for malignant neoplasm of colon: Secondary | ICD-10-CM | POA: Diagnosis not present

## 2022-06-19 DIAGNOSIS — Z9081 Acquired absence of spleen: Secondary | ICD-10-CM | POA: Diagnosis not present

## 2022-06-19 DIAGNOSIS — E669 Obesity, unspecified: Secondary | ICD-10-CM | POA: Diagnosis not present

## 2022-06-19 DIAGNOSIS — Z6836 Body mass index (BMI) 36.0-36.9, adult: Secondary | ICD-10-CM | POA: Diagnosis not present

## 2022-06-19 HISTORY — PX: ESOPHAGOGASTRODUODENOSCOPY: SHX5428

## 2022-06-19 HISTORY — PX: COLONOSCOPY: SHX5424

## 2022-06-19 SURGERY — COLONOSCOPY
Anesthesia: General

## 2022-06-19 MED ORDER — GLYCOPYRROLATE 0.2 MG/ML IJ SOLN
INTRAMUSCULAR | Status: DC | PRN
  Administered 2022-06-19: .2 mg via INTRAVENOUS

## 2022-06-19 MED ORDER — PHENYLEPHRINE HCL (PRESSORS) 10 MG/ML IV SOLN
INTRAVENOUS | Status: DC | PRN
  Administered 2022-06-19 (×7): 80 ug via INTRAVENOUS

## 2022-06-19 MED ORDER — PROPOFOL 500 MG/50ML IV EMUL
INTRAVENOUS | Status: DC | PRN
  Administered 2022-06-19: 140 ug/kg/min via INTRAVENOUS

## 2022-06-19 MED ORDER — LIDOCAINE HCL (CARDIAC) PF 100 MG/5ML IV SOSY
PREFILLED_SYRINGE | INTRAVENOUS | Status: DC | PRN
  Administered 2022-06-19: 100 mg via INTRAVENOUS

## 2022-06-19 MED ORDER — SODIUM CHLORIDE 0.9 % IV SOLN
INTRAVENOUS | Status: DC
  Administered 2022-06-19: 1000 mL via INTRAVENOUS

## 2022-06-19 MED ORDER — PROPOFOL 10 MG/ML IV BOLUS
INTRAVENOUS | Status: DC | PRN
  Administered 2022-06-19: 70 mg via INTRAVENOUS
  Administered 2022-06-19: 20 mg via INTRAVENOUS

## 2022-06-19 MED ORDER — DEXMEDETOMIDINE (PRECEDEX) IN NS 20 MCG/5ML (4 MCG/ML) IV SYRINGE
PREFILLED_SYRINGE | INTRAVENOUS | Status: DC | PRN
  Administered 2022-06-19: 8 ug via INTRAVENOUS
  Administered 2022-06-19: 4 ug via INTRAVENOUS

## 2022-06-19 MED ORDER — EPHEDRINE SULFATE (PRESSORS) 50 MG/ML IJ SOLN
INTRAMUSCULAR | Status: DC | PRN
  Administered 2022-06-19: 5 mg via INTRAVENOUS

## 2022-06-19 NOTE — Anesthesia Postprocedure Evaluation (Signed)
Anesthesia Post Note  Patient: Jesse Norton  Procedure(s) Performed: COLONOSCOPY ESOPHAGOGASTRODUODENOSCOPY (EGD)  Patient location during evaluation: Endoscopy Anesthesia Type: General Level of consciousness: awake and alert Pain management: pain level controlled Vital Signs Assessment: post-procedure vital signs reviewed and stable Respiratory status: spontaneous breathing, nonlabored ventilation, respiratory function stable and patient connected to nasal cannula oxygen Cardiovascular status: blood pressure returned to baseline and stable Postop Assessment: no apparent nausea or vomiting Anesthetic complications: no   No notable events documented.   Last Vitals:  Vitals:   06/19/22 1000 06/19/22 1010  BP: 104/69 119/72  Pulse: 80 79  Resp: (!) 24 (!) 23  Temp:    SpO2: 97% 91%    Last Pain:  Vitals:   06/19/22 1010  TempSrc:   PainSc: 0-No pain                 Precious Haws Tiyah Zelenak

## 2022-06-19 NOTE — Interval H&P Note (Signed)
History and Physical Interval Note: Preprocedure H&P from 06/19/22  was reviewed and there was no interval change after seeing and examining the patient.  Written consent was obtained from the patient after discussion of risks, benefits, and alternatives. Patient has consented to proceed with Esophagogastroduodenoscopy and Colonoscopy with possible intervention   06/19/2022 8:34 AM  Jesse Norton  has presented today for surgery, with the diagnosis of PRS HX COLON POLYPS BARRETT'S REFLUX.  The various methods of treatment have been discussed with the patient and family. After consideration of risks, benefits and other options for treatment, the patient has consented to  Procedure(s): COLONOSCOPY (N/A) ESOPHAGOGASTRODUODENOSCOPY (EGD) (N/A) as a surgical intervention.  The patient's history has been reviewed, patient examined, no change in status, stable for surgery.  I have reviewed the patient's chart and labs.  Questions were answered to the patient's satisfaction.     Annamaria Helling

## 2022-06-19 NOTE — Transfer of Care (Signed)
Immediate Anesthesia Transfer of Care Note  Patient: Jesse Norton  Procedure(s) Performed: COLONOSCOPY ESOPHAGOGASTRODUODENOSCOPY (EGD)  Patient Location: PACU  Anesthesia Type:General  Level of Consciousness: awake, alert  and oriented  Airway & Oxygen Therapy: Patient Spontanous Breathing  Post-op Assessment: Report given to RN and Post -op Vital signs reviewed and stable  Post vital signs: Reviewed and stable  Last Vitals:  Vitals Value Taken Time  BP 105/73 06/19/22 0951  Temp    Pulse 73 06/19/22 0953  Resp 20 06/19/22 0953  SpO2 97 % 06/19/22 0953  Vitals shown include unvalidated device data.  Last Pain:  Vitals:   06/19/22 0803  TempSrc: Temporal  PainSc: 0-No pain         Complications: No notable events documented.

## 2022-06-19 NOTE — Anesthesia Preprocedure Evaluation (Signed)
Anesthesia Evaluation  Patient identified by MRN, date of birth, ID band Patient awake    Reviewed: Allergy & Precautions, NPO status , Patient's Chart, lab work & pertinent test results  History of Anesthesia Complications Negative for: history of anesthetic complications  Airway Mallampati: III  TM Distance: <3 FB Neck ROM: full    Dental  (+) Chipped   Pulmonary sleep apnea ,    Pulmonary exam normal        Cardiovascular Exercise Tolerance: Good hypertension, (-) angina+ CAD and + Peripheral Vascular Disease  Normal cardiovascular exam     Neuro/Psych PSYCHIATRIC DISORDERS  Neuromuscular disease    GI/Hepatic Neg liver ROS, hiatal hernia, PUD, GERD  Controlled,  Endo/Other  negative endocrine ROS  Renal/GU negative Renal ROS  negative genitourinary   Musculoskeletal   Abdominal   Peds  Hematology negative hematology ROS (+)   Anesthesia Other Findings Past Medical History: No date: (HFpEF) heart failure with preserved ejection fraction ([facility])     Comment:  a. 01/2019 Cath: EF 55-60% w/ elevated LVEDP. No date: Anxiety No date: Barrett's esophagus No date: CAD (coronary artery disease)     Comment:  a. 10/2018 ETT: 92m horizontal inflat ST depression,               freq PVC's->Intermediate; b. 12/2018 Cardiac CTA: LM nl,               LAD FFRct 0.6 in mLAD, LCX FFRct 0.99p, 0.827m0.77d, RCA              no signif dzs, FFRct 0.89d->Rec cath; c. 01/2019 PCI: LM               nl, LAD 40p, 8552m2 70, LCX 70p (2.75x15 Resolute Onyx               DES), RCA min irregs. EF 55-65%. EDP 25-9m53m d. 01/2019              PCI: LAD 80-90 (atherectomy & 3x15 Resolute Onyx DES). No date: Colon polyps 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 pylori infection No date: H/O urethral stricture No date: Hemorrhoids No date: Hiatal hernia No date:  Hypertension No date: Stomach ulcer  Past Surgical History: No date: CARDIAC CATHETERIZATION 03/07/2022: CATARACT EXTRACTION W/PHACO; Left     Comment:  Procedure: CATARACT EXTRACTION PHACO AND INTRAOCULAR               LENS PLACEMENT (IOC)[place] 10.41               00:55.7;  Surgeon: PorfBirder Robson;  [facility]               [facility];                Laterality: Left; 03/28/2022: CATARACT EXTRACTION W/PHACO; Right     Comment:  Procedure: CATARACT EXTRACTION PHACO AND INTRAOCULAR               LENS PLACEMENT (IOC) RIGHT VIVITY LENS 6.68 00:50.0;                Surgeon: PorfBirder Robson;  [facility];  Laterality: Right; No date: COLONOSCOPY 08/20/2018: COLONOSCOPY WITH PROPOFOL; N/A     Comment:  Procedure: COLONOSCOPY WITH  PROPOFOL;  Surgeon:               Lollie Sails, MD;  Location: [facility] ENDOSCOPY;                Service: Endoscopy;  Laterality: N/A; 12/19/2018: COLONOSCOPY WITH PROPOFOL; N/A     Comment:  Procedure: COLONOSCOPY WITH PROPOFOL;  Surgeon:               Lollie Sails, MD;  Location: [facility] ENDOSCOPY;                Service: Endoscopy;  Laterality: N/A; No date: CORONARY ANGIOPLASTY 02/03/2019: CORONARY ATHERECTOMY; N/A     Comment:  Procedure: CORONARY ATHERECTOMY;  Surgeon: Nelva Bush, MD;  Location: [place] CV LAB;  Service:              Cardiovascular;  Laterality: N/A; 01/21/2019: CORONARY STENT INTERVENTION; N/A     Comment:  Procedure: CORONARY STENT INTERVENTION;  Surgeon: Nelva Bush, MD;  Location: [place] CV LAB;                Service: Cardiovascular;  Laterality: N/A; 02/03/2019: CORONARY STENT INTERVENTION; N/A     Comment:  Procedure: CORONARY STENT INTERVENTION;  Surgeon: Nelva Bush, MD;  Location: [place] CV LAB;  Service:               Cardiovascular;  Laterality: N/A; 08/16/2018: CYSTOSCOPY WITH URETHRAL DILATATION; N/A     Comment:  Procedure: CYSTOSCOPY WITH URETHRAL DILATATION;                Surgeon: [name], MD;  Location: [facility] ORS;                Service: Urology;  Laterality: N/A; 01/29/2017: ESOPHAGOGASTRODUODENOSCOPY; N/A     Comment:  Procedure: ESOPHAGOGASTRODUODENOSCOPY (EGD);  Surgeon:               Lollie Sails, MD;  Location: [facility] ENDOSCOPY;                Service: Endoscopy;  Laterality: N/A; 07/10/2016: ESOPHAGOGASTRODUODENOSCOPY (EGD) WITH PROPOFOL; N/A     Comment:  Procedure: ESOPHAGOGASTRODUODENOSCOPY (EGD) WITH               PROPOFOL;  Surgeon: Lollie Sails, MD;  Location:               [facility] ENDOSCOPY;  Service: Endoscopy;  Laterality: N/A; 12/19/2018: ESOPHAGOGASTRODUODENOSCOPY (EGD) WITH PROPOFOL; N/A     Comment:  Procedure: ESOPHAGOGASTRODUODENOSCOPY (EGD) WITH               PROPOFOL;  Surgeon: Lollie Sails, MD;  Location:               [facility] ENDOSCOPY;  Service: Endoscopy;  Laterality: N/A; No date: EYE SURGERY No date: gastritis No date: h.pylori 02/03/2019: INTRAVASCULAR ULTRASOUND/IVUS; N/A     Comment:  Procedure: Intravascular Ultrasound/IVUS;  Surgeon: Nelva Bush, MD;  Location: [place] CV LAB;  Service:              Cardiovascular;  Laterality: N/A; No date: left elbow repair; Left 01/21/2019: LEFT HEART CATH  AND CORONARY ANGIOGRAPHY; Left     Comment:  Procedure: LEFT HEART CATH AND CORONARY ANGIOGRAPHY;                Surgeon: Nelva Bush, MD;  Location: [place]               CV LAB;  Service: Cardiovascular;  Laterality: Left; 1957: SPLENECTOMY No date: TONSILLECTOMY  BMI    Body Mass Index: 36.34 kg/m      Reproductive/Obstetrics negative OB ROS                             Anesthesia Physical Anesthesia Plan  ASA: 3  Anesthesia Plan: General   Post-op Pain Management:     Induction: Intravenous  PONV Risk Score and Plan: Propofol infusion and TIVA  Airway Management Planned: Natural Airway and Nasal Cannula  Additional Equipment:   Intra-op Plan:   Post-operative Plan:   Informed Consent: I have reviewed the patients History and Physical, chart, labs and discussed the procedure including the risks, benefits and alternatives for the proposed anesthesia with the patient or authorized representative who has indicated his/her understanding and acceptance.     Dental Advisory Given  Plan Discussed with: Anesthesiologist, CRNA and Surgeon  Anesthesia Plan Comments: (Patient consented for risks of anesthesia including but not limited to:  - adverse reactions to medications - risk of airway placement if required - damage to eyes, teeth, lips or other oral mucosa - nerve damage due to positioning  - sore throat or hoarseness - Damage to heart, brain, nerves, lungs, other parts of body or loss of life  Patient voiced understanding.)        Anesthesia Quick Evaluation

## 2022-06-19 NOTE — Op Note (Signed)
[facility] Gastroenterology Patient Name: Jesse Norton Procedure Date: 06/19/2022 8:24 AM MRN: [id number] Account #: [id number] Date of Birth: September 29, 1946 Admit Type: Outpatient Age: 76 Room: [facility] ENDO ROOM 2 Gender: Male Note Status: Finalized Instrument Name: Upper Endoscope [id number] Procedure:             Upper GI endoscopy Indications:           Surveillance procedure, Barrett's esophagus, Follow-up                         of Barrett's esophagus Providers:             Annamaria Helling DO, DO Referring MD:          Barbaraann Faster. Ned Card (Referring MD) Medicines:             Monitored Anesthesia Care Complications:         No immediate complications. Estimated blood loss:                         Minimal. Procedure:             Pre-Anesthesia Assessment:                        - Prior to the procedure, a History and Physical was                         performed, and patient medications and allergies were                         reviewed. The patient is competent. The risks and                         benefits of the procedure and the sedation options and                         risks were discussed with the patient. All questions                         were answered and informed consent was obtained.                         Patient identification and proposed procedure were                         verified by the physician, the nurse, the anesthetist                         and the technician in the endoscopy suite. Mental                         Status Examination: alert and oriented. Airway                         Examination: normal oropharyngeal airway and neck                         mobility. Respiratory Examination: clear to  auscultation. CV Examination: RRR, no murmurs, no S3                         or S4. Prophylactic Antibiotics: The patient does not                         require prophylactic antibiotics. Prior                          Anticoagulants: The patient has taken Plavix                         (clopidogrel), last dose was 7 days prior to                         procedure. ASA Grade Assessment: III - A patient with                         severe systemic disease. After reviewing the risks and                         benefits, the patient was deemed in satisfactory                         condition to undergo the procedure. The anesthesia                         plan was to use monitored anesthesia care (MAC).                         Immediately prior to administration of medications,                         the patient was re-assessed for adequacy to receive                         sedatives. The heart rate, respiratory rate, oxygen                         saturations, blood pressure, adequacy of pulmonary                         ventilation, and response to care were monitored                         throughout the procedure. The physical status of the                         patient was re-assessed after the procedure.                        After obtaining informed consent, the endoscope was                         passed under direct vision. Throughout the procedure,                         the patient's blood pressure, pulse, and oxygen  saturations were monitored continuously. The Endoscope                         was introduced through the mouth, and advanced to the                         second part of duodenum. The upper GI endoscopy was                         accomplished without difficulty. The patient tolerated                         the procedure well. Findings:      The duodenal bulb, first portion of the duodenum and second portion of       the duodenum were normal. Estimated blood loss: none.      A small hiatal hernia was present. Estimated blood loss: none.      Multiple 2 to 12 mm pedunculated polyps with no bleeding and stigmata of       recent bleeding  were found in the entire examined stomach. Four polyps       were removed with a hot snare. Resection and retrieval were complete. To       prevent bleeding after the polypectomy, four hemostatic clips were       successfully placed (one each) (MR conditional). There was no bleeding       at the end of the procedure. Estimated blood loss was minimal.      The exam of the stomach was otherwise normal.      There were esophageal mucosal changes classified as Barrett's stage       C1-M4 per Prague criteria present in the lower third of the esophagus.       The maximum longitudinal extent of these mucosal changes was 4 cm in       length. Mucosa was biopsied with a cold forceps for histology. A total       of 3 specimen bottles were sent to pathology. four quadrant biopsies       taken every 2 cm for three bottles. Estimated blood loss was minimal. No       nodularity or abnormality appreciated on NBI.      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 Estimated blood loss: none.      The exam of the esophagus was otherwise normal. Impression:            - Normal duodenal bulb, first portion of the duodenum                         and second portion of the duodenum.                        - Small hiatal hernia.                        - Multiple gastric polyps. Resected and retrieved.                         Clips (MR conditional) were placed.                        -  Esophageal mucosal changes classified as Barrett's                         stage C1-M4 per Prague criteria. Biopsied.                        - Ectopic gastric mucosa in the upper third of the                         esophagus. Recommendation:        - Discharge patient to home.                        - Resume previous diet.                        - Continue present medications.                        - Await pathology results.                        - Repeat upper endoscopy in 3  years for surveillance                         of Barrett's esophagus.                        - Return to referring physician as previously                         scheduled.                        - No aspirin, ibuprofen, naproxen, or other                         non-steroidal anti-inflammatory drugs for 5 days after                         polyp removal.                        - The findings and recommendations were discussed with                         the patient. Procedure Code(s):     --- Professional ---                        [phone], Esophagogastroduodenoscopy, flexible,                         transoral; with removal of tumor(s), polyp(s), or                         other lesion(s) by snare technique                        43239, 24, Esophagogastroduodenoscopy, flexible,                         transoral; with biopsy, single or multiple Diagnosis  Code(s):     --- Professional ---                        K22.70, Barrett's esophagus without dysplasia                        K44.9, Diaphragmatic hernia without obstruction or                         gangrene                        K31.7, Polyp of stomach and duodenum                        Q40.2, Other specified congenital malformations of                         stomach CPT copyright 2019 American [employer]. All rights reserved. The codes documented in this report are preliminary and upon coder review may  be revised to meet current compliance requirements. Attending Participation:      I personally performed the entire procedure. Volney American, DO Annamaria Helling DO, DO 06/19/2022 9:49:30 AM This report has been signed electronically. Number of Addenda: 0 Note Initiated On: 06/19/2022 8:24 AM Estimated Blood Loss:  Estimated blood loss was minimal.      [facility]

## 2022-06-19 NOTE — Op Note (Signed)
[facility] Gastroenterology Patient Name: Jesse Norton Procedure Date: 06/19/2022 8:23 AM MRN: [id number] Account #: [id number] Date of Birth: 1946-01-03 Admit Type: Outpatient Age: 76 Room: [facility] ENDO ROOM 2 Gender: Male Note Status: Finalized Instrument Name: Colonoscope [id number] Procedure:             Colonoscopy Indications:           High risk colon cancer surveillance: Personal history                         of colonic polyps Providers:             Annamaria Helling DO, DO Referring MD:          Barbaraann Faster. Ned Card (Referring MD) Medicines:             Monitored Anesthesia Care Complications:         No immediate complications. Estimated blood loss:                         Minimal. Procedure:             Pre-Anesthesia Assessment:                        - Prior to the procedure, a History and Physical was                         performed, and patient medications and allergies were                         reviewed. The patient is competent. The risks and                         benefits of the procedure and the sedation options and                         risks were discussed with the patient. All questions                         were answered and informed consent was obtained.                         Patient identification and proposed procedure were                         verified by the physician, the nurse, the anesthetist                         and the technician in the endoscopy suite. Mental                         Status Examination: alert and oriented. Airway                         Examination: normal oropharyngeal airway and neck                         mobility. Respiratory Examination: clear to  auscultation. CV Examination: RRR, no murmurs, no S3                         or S4. Prophylactic Antibiotics: The patient does not                         require prophylactic antibiotics. Prior                          Anticoagulants: The patient has taken Plavix                         (clopidogrel), last dose was 7 days prior to                         procedure. ASA Grade Assessment: III - A patient with                         severe systemic disease. After reviewing the risks and                         benefits, the patient was deemed in satisfactory                         condition to undergo the procedure. The anesthesia                         plan was to use monitored anesthesia care (MAC).                         Immediately prior to administration of medications,                         the patient was re-assessed for adequacy to receive                         sedatives. The heart rate, respiratory rate, oxygen                         saturations, blood pressure, adequacy of pulmonary                         ventilation, and response to care were monitored                         throughout the procedure. The physical status of the                         patient was re-assessed after the procedure.                        After obtaining informed consent, the colonoscope was                         passed under direct vision. Throughout the procedure,                         the patient's blood pressure, pulse, and oxygen  saturations were monitored continuously. The                         Colonoscope was introduced through the anus and                         advanced to the the cecum, identified by appendiceal                         orifice and ileocecal valve. The colonoscopy was                         somewhat difficult due to significant looping.                         Successful completion of the procedure was aided by                         withdrawing and reinserting the scope, straightening                         and shortening the scope to obtain bowel loop                         reduction, using scope torsion and lavage. The patient                          tolerated the procedure well. The quality of the bowel                         preparation was evaluated using the BBPS [facility]                         Preparation Scale) with scores of: Right Colon = 3,                         Transverse Colon = 3 and Left Colon = 3 (entire mucosa                         seen well with no residual staining, small fragments                         of stool or opaque liquid). The total BBPS score                         equals 9. The terminal ileum, ileocecal valve,                         appendiceal orifice, and rectum were photographed. Findings:      Skin tags were found on perianal exam.      The digital rectal exam was normal. Pertinent negatives include normal       sphincter tone.      A 3 to 4 mm polyp was found in the transverse colon. The polyp was       sessile. The polyp was removed with a cold snare. Resection and       retrieval were complete. Estimated blood loss  was minimal.      Two sessile polyps were found in the transverse colon and ascending       colon. The polyps were 1 to 2 mm in size. These polyps were removed with       a cold biopsy forceps. Resection and retrieval were complete. Estimated       blood loss was minimal.      Multiple small-mouthed diverticula were found in the left colon.       Estimated blood loss: none.      Non-bleeding internal hemorrhoids were found during retroflexion and       during perianal exam. The hemorrhoids were Grade II (internal       hemorrhoids that prolapse but reduce spontaneously). Estimated blood       loss: none.      The exam was otherwise without abnormality on direct and retroflexion       views. Impression:            - Perianal skin tags found on perianal exam.                        - One 3 to 4 mm polyp in the transverse colon, removed                         with a cold snare. Resected and retrieved.                        - Two 1 to 2 mm polyps in the transverse colon  and in                         the ascending colon, removed with a cold biopsy                         forceps. Resected and retrieved.                        - Diverticulosis in the left colon.                        - Non-bleeding internal hemorrhoids.                        - The examination was otherwise normal on direct and                         retroflexion views. Recommendation:        - Discharge patient to home.                        - Resume previous diet.                        - No aspirin, ibuprofen, naproxen, or other                         non-steroidal anti-inflammatory drugs for 5 days after                         polyp removal.                        -  Continue present medications.                        - Await pathology results.                        - Repeat colonoscopy for surveillance based on                         pathology results.                        - Return to referring physician as previously                         scheduled.                        - The findings and recommendations were discussed with                         the patient. Procedure Code(s):     --- Professional ---                        [phone], Colonoscopy, flexible; with removal of                         tumor(s), polyp(s), or other lesion(s) by snare                         technique                        45380, 47, Colonoscopy, flexible; with biopsy, single                         or multiple Diagnosis Code(s):     --- Professional ---                        K63.5, Polyp of colon                        Z86.010, Personal history of colonic polyps                        K64.1, Second degree hemorrhoids                        K64.4, Residual hemorrhoidal skin tags                        K57.30, Diverticulosis of large intestine without                         perforation or abscess without bleeding CPT copyright 2019 American [employer] Association. All rights reserved. The  codes documented in this report are preliminary and upon coder review may  be revised to meet current compliance requirements. Attending Participation:      I personally performed the entire procedure. Volney American, DO Annamaria Helling DO, DO 06/19/2022 9:55:41 AM This report has been signed electronically. Number of Addenda: 0 Note Initiated On: 06/19/2022 8:23 AM Scope Withdrawal Time:  0 hours 14 minutes 55 seconds  Total Procedure Duration: 0 hours 27 minutes 42 seconds  Estimated Blood Loss:  Estimated blood loss was minimal.      [facility]

## 2022-06-20 ENCOUNTER — Encounter: Payer: Self-pay | Admitting: Gastroenterology

## 2022-06-20 LAB — SURGICAL PATHOLOGY

## 2022-06-23 ENCOUNTER — Ambulatory Visit: Payer: Medicare Other

## 2022-06-30 DIAGNOSIS — M62838 Other muscle spasm: Secondary | ICD-10-CM | POA: Diagnosis not present

## 2022-06-30 DIAGNOSIS — M9901 Segmental and somatic dysfunction of cervical region: Secondary | ICD-10-CM | POA: Diagnosis not present

## 2022-06-30 DIAGNOSIS — M50322 Other cervical disc degeneration at C5-C6 level: Secondary | ICD-10-CM | POA: Diagnosis not present

## 2022-06-30 DIAGNOSIS — M50321 Other cervical disc degeneration at C4-C5 level: Secondary | ICD-10-CM | POA: Diagnosis not present

## 2022-07-03 DIAGNOSIS — M50321 Other cervical disc degeneration at C4-C5 level: Secondary | ICD-10-CM | POA: Diagnosis not present

## 2022-07-03 DIAGNOSIS — M62838 Other muscle spasm: Secondary | ICD-10-CM | POA: Diagnosis not present

## 2022-07-03 DIAGNOSIS — M50322 Other cervical disc degeneration at C5-C6 level: Secondary | ICD-10-CM | POA: Diagnosis not present

## 2022-07-03 DIAGNOSIS — M9901 Segmental and somatic dysfunction of cervical region: Secondary | ICD-10-CM | POA: Diagnosis not present

## 2022-07-04 ENCOUNTER — Ambulatory Visit (INDEPENDENT_AMBULATORY_CARE_PROVIDER_SITE_OTHER): Payer: Medicare Other | Admitting: *Deleted

## 2022-07-04 DIAGNOSIS — M62838 Other muscle spasm: Secondary | ICD-10-CM | POA: Diagnosis not present

## 2022-07-04 DIAGNOSIS — M9901 Segmental and somatic dysfunction of cervical region: Secondary | ICD-10-CM | POA: Diagnosis not present

## 2022-07-04 DIAGNOSIS — M50322 Other cervical disc degeneration at C5-C6 level: Secondary | ICD-10-CM | POA: Diagnosis not present

## 2022-07-04 DIAGNOSIS — Z0000 Encounter for general adult medical examination without abnormal findings: Secondary | ICD-10-CM | POA: Diagnosis not present

## 2022-07-04 DIAGNOSIS — M50321 Other cervical disc degeneration at C4-C5 level: Secondary | ICD-10-CM | POA: Diagnosis not present

## 2022-07-04 NOTE — Progress Notes (Signed)
Subjective:   Jesse Norton is a 76 y.o. male who presents for Medicare Annual/Subsequent preventive examination.  I connected with  Jesse Norton on 07/04/22 by a  telephone enabled telemedicine application and verified that I am speaking with the correct person using two identifiers.   I discussed the limitations of evaluation and management by telemedicine. The patient expressed understanding and agreed to proceed.  Patient location: home  Provider location:  Tele-Health-home    Review of Systems     Cardiac Risk Factors include: advanced age (>57mn, >>59women);male gender;obesity (BMI >30kg/m2)     Objective:    Today's Vitals   07/04/22 1326  PainSc: 4    There is no height or weight on file to calculate BMI.     07/04/2022    1:27 PM 06/19/2022    8:01 AM 03/28/2022    9:02 AM 03/07/2022    7:35 AM 06/20/2021    9:04 AM 01/21/2021   10:48 AM 06/11/2020    9:06 AM  Advanced Directives  Does Patient Have a Medical Advance Directive? Yes Yes Yes Yes Yes Yes Yes  Type of AParamedicof [place]ake[place] ParkLiving will HEvaroLiving will HB[place]Living will HHeboLiving will  HLake ShoreLiving will  Does patient want to make changes to medical advance directive?   No - Patient declined No - Patient declined  No - Patient declined   Copy of HPortsmouthin Chart? No - copy requested  No - copy requested Yes - validated most recent copy scanned in chart (See row information) No - copy requested  No - copy requested    Current Medications (verified) Outpatient Encounter Medications as of 07/04/2022  Medication Sig   allopurinol (ZYLOPRIM) 300 MG tablet Take 1 tablet (300 mg total) by mouth 3 (three) times a week.   amLODipine (NORVASC) 10 MG tablet TAKE 1 TABLET(10 MG) BY MOUTH DAILY   atorvastatin (LIPITOR) 40 MG tablet Take 1  tablet (40 mg total) by mouth daily.   clobetasol ointment (TEMOVATE) 0.05 % Apply topically as needed.   clopidogrel (PLAVIX) 75 MG tablet Take 1 tablet (75 mg total) by mouth daily with breakfast.   furosemide (LASIX) 20 MG tablet Take 1 tablet (20 mg total) by mouth daily.   LORazepam (ATIVAN) 1 MG tablet Take 0.5-1 tablets (0.5-1 mg total) by mouth daily as needed for anxiety.   losartan (COZAAR) 100 MG tablet TAKE 1 TABLET(100 MG) BY MOUTH DAILY   losartan-hydrochlorothiazide (HYZAAR) 100-12.5 MG tablet Take 1 tablet by mouth daily.   metoprolol tartrate (LOPRESSOR) 25 MG tablet TAKE 1 TABLET(25 MG) BY MOUTH TWICE DAILY   nitroGLYCERIN (NITROSTAT) 0.4 MG SL tablet Place 1 tablet (0.4 mg total) under the tongue every 5 (five) minutes as needed for chest pain. Maximum of 3 doses.   OVER THE COUNTER MEDICATION AGI (agricultural greens) powder form by mouth once daily   pantoprazole (PROTONIX) 20 MG tablet Take 1 tablet (20 mg total) by mouth 2 (two) times daily.   Semaglutide,0.25 or 0.'5MG'$ /DOS, (OZEMPIC, 0.25 OR 0.5 MG/DOSE,) 2 MG/1.5ML SOPN Start with 0.25 MG into skin once a week x 4 weeks, then increase to 0.5 MG into the skin weekly.   sertraline (ZOLOFT) 50 MG tablet Take 1 tablet (50 mg total) by mouth daily. (Patient not taking: Reported on 06/19/2022)   No facility-administered encounter medications on file as of 07/04/2022.  Allergies (verified) Patient has no known allergies.   History: Past Medical History:  Diagnosis Date   (HFpEF) heart failure with preserved ejection fraction ([place])    a. 01/2019 Cath: EF 55-60% w/ elevated LVEDP.   Anxiety    Barrett's esophagus    CAD (coronary artery disease)    a. 10/2018 ETT: 3m horizontal inflat ST depression, freq PVC's->Intermediate; b. 12/2018 Cardiac CTA: LM nl, LAD FFRct 0.6 in mLAD, LCX FFRct 0.99p, 0.831m0.77d, RCA no signif dzs, FFRct 0.89d->Rec cath; c. 01/2019 PCI: LM nl, LAD 40p, 8559m2 70, LCX 70p (2.75x15 Resolute Onyx  DES), RCA min irregs. EF 55-65%. EDP 25-24m63m d. 01/2019 PCI: LAD 80-90 (atherectomy & 3x15 Resolute Onyx DES).   Colon polyps    Duodenal adenoma    Gastritis    GERD (gastroesophageal reflux disease)    Gout    H. pylori infection    H/O urethral stricture    Hemorrhoids    Hiatal hernia    Hypertension    Stomach ulcer    Past Surgical History:  Procedure Laterality Date   CARDIAC CATHETERIZATION     CATARACT EXTRACTION W/PHACO Left 03/07/2022   Procedure: CATARACT EXTRACTION PHACO AND INTRAOCULAR LENS PLACEMENT (IOC)[place] LENS 10.41 00:55.7;  Surgeon: PorfBirder Robson;  [facility];  Service: Ophthalmology;  Laterality: Left;   CATARACT EXTRACTION W/PHACO Right 03/28/2022   Procedure: CATARACT EXTRACTION PHACO AND INTRAOCULAR LENS PLACEMENT (IOC)[place] 6.68 00:50.0;  Surgeon: PorfBirder Robson;  [facility];  Laterality: Right;   COLONOSCOPY     COLONOSCOPY N/A 06/19/2022   Procedure: COLONOSCOPY;  Surgeon: RussAnnamaria Helling;  [facility];  Service: Gastroenterology;  Laterality: N/A;   COLONOSCOPY WITH PROPOFOL N/A 08/20/2018   Procedure: COLONOSCOPY WITH PROPOFOL;  Surgeon: SkulLollie Sails;  [facility];  Service: Endoscopy;  Laterality: N/A;   COLONOSCOPY WITH PROPOFOL N/A 12/19/2018   Procedure: COLONOSCOPY WITH PROPOFOL;  Surgeon: SkulLollie Sails;  [facility];  Service: Endoscopy;  Laterality: N/A;   CORONARY ANGIOPLASTY     CORONARY ATHERECTOMY N/A 02/03/2019   Procedure: CORONARY ATHERECTOMY;  Surgeon: End,Nelva Bush;  [facility];  Service: Cardiovascular;  Laterality: N/A;   CORONARY STENT INTERVENTION N/A 01/21/2019   Procedure: CORONARY STENT INTERVENTION;  Surgeon: End,Nelva Bush;  [facility];  Service: Cardiovascular;  Laterality: N/A;   CORONARY STENT INTERVENTION  N/A 02/03/2019   Procedure: CORONARY STENT INTERVENTION;  Surgeon: End,Nelva Bush;  [facility];  Service: Cardiovascular;  Laterality: N/A;   CYSTOSCOPY WITH URETHRAL DILATATION N/A 08/16/2018   Procedure: CYSTOSCOPY WITH URETHRAL DILATATION;  Surgeon: SninBilley Co;  [facility];  Service: Urology;  Laterality: N/A;   ESOPHAGOGASTRODUODENOSCOPY N/A 01/29/2017   Procedure: ESOPHAGOGASTRODUODENOSCOPY (EGD);  Surgeon: MartLollie Sails;  [facility];  Service: Endoscopy;  Laterality: N/A;   ESOPHAGOGASTRODUODENOSCOPY N/A 06/19/2022   Procedure: ESOPHAGOGASTRODUODENOSCOPY (EGD);  Surgeon: RussAnnamaria Helling;  [facility];  Service: Gastroenterology;  Laterality: N/A;   ESOPHAGOGASTRODUODENOSCOPY (EGD) WITH PROPOFOL N/A 07/10/2016   Procedure: ESOPHAGOGASTRODUODENOSCOPY (EGD) WITH PROPOFOL;  Surgeon: MartLollie Sails;  [facility];  Service: Endoscopy;  Laterality: N/A;   ESOPHAGOGASTRODUODENOSCOPY (EGD) WITH PROPOFOL N/A 12/19/2018   Procedure: ESOPHAGOGASTRODUODENOSCOPY (EGD) WITH PROPOFOL;  Surgeon: SkulLollie Sails;  [facility];  Service: Endoscopy;  Laterality: N/A;  EYE SURGERY     gastritis     h.pylori     INTRAVASCULAR ULTRASOUND/IVUS N/A 02/03/2019   Procedure: Intravascular Ultrasound/IVUS;  Surgeon: Nelva Bush, MD;  Location: [place] [facility];  Service: Cardiovascular;  Laterality: N/A;   left elbow repair Left    LEFT HEART CATH AND CORONARY ANGIOGRAPHY Left 01/21/2019   Procedure: LEFT HEART CATH AND CORONARY ANGIOGRAPHY;  Surgeon: Nelva Bush, MD;  Location: [place] [facility];  Service: Cardiovascular;  Laterality: Left;   SPLENECTOMY  1957   TONSILLECTOMY     Family History  Adopted: Yes  Problem Relation Age of Onset   Heart attack Mother 57   Pancreatic cancer Father        died in WWII - pt adopted   Hypertension Sister    Hyperlipidemia Sister     Hypertension Brother    Hyperlipidemia Brother    Prostate cancer Neg Hx    Kidney cancer Neg Hx    Bladder Cancer Neg Hx    Social History   Socioeconomic History   Marital status: Married    Spouse name: Not on file   Number of children: Not on file   Years of education: Not on file   Highest education level: Master's degree (e.g., MA, MS, MEng, MEd, MSW, MBA)  Occupational History   Occupation: retired   Tobacco Use   Smoking status: Never   Smokeless tobacco: Never  Vaping Use   Vaping Use: Never used  Substance and Sexual Activity   Alcohol use: Yes    Alcohol/week: 5.0 standard drinks of alcohol    Types: 5 Standard drinks or equivalent per week    Comment: moderate-"a couple of glasses of wine each night"   Drug use: No   Sexual activity: Yes  Other Topics Concern   Not on file  Social History Narrative   Rides bicycle with friends       No smoking.  Occasional alcohol.  Lives at home.  Retired Customer service manager.   Social Determinants of Health   Financial Resource Strain: Low Risk  (07/04/2022)   Overall Financial Resource Strain (CARDIA)    Difficulty of Paying Living Expenses: Not hard at all  Food Insecurity: No Food Insecurity (07/04/2022)   Hunger Vital Sign    Worried About Running Out of Food in the Last Year: Never true    Ran Out of Food in the Last Year: Never true  Transportation Needs: No Transportation Needs (07/04/2022)   PRAPARE - Hydrologist (Medical): No    Lack of Transportation (Non-Medical): No  Physical Activity: Insufficiently Active (07/04/2022)   Exercise Vital Sign    Days of Exercise per Week: 3 days    Minutes of Exercise per Session: 30 min  Stress: No Stress Concern Present (07/04/2022)   [place]    Feeling of Stress : Not at all  Social Connections: Unknown (07/04/2022)   Social Connection and Isolation Panel [NHANES]    Frequency of  Communication with Friends and Family: Not on file    Frequency of Social Gatherings with Friends and Family: Once a week    Attends Religious Services: More than 4 times per year    Active Member of [employer] or Organizations: No    Attends Archivist Meetings: Never    Marital Status: Married    Tobacco Counseling Counseling given: Not Answered   Clinical Intake:  Pre-visit preparation completed:  Yes  Pain : 0-10 Pain Score: 4  Pain Type: Acute pain Pain Location: Neck Pain Orientation: Right Pain Descriptors / Indicators: Constant, Burning, Aching Pain Onset: In the past 7 days Pain Frequency: Intermittent Pain Relieving Factors: tylenol-   seeing chiropractor  Pain Relieving Factors: tylenol-   seeing chiropractor  Nutritional Risks: None Diabetes: No  How often do you need to have someone help you when you read instructions, pamphlets, or other written materials from your doctor or pharmacy?: 1 - Never  Diabetic?  no  Interpreter Needed?: No  Information entered by :: Leroy Kennedy LPN   Activities of Daily Living    07/04/2022    1:32 PM 03/28/2022    8:55 AM  In your present state of health, do you have any difficulty performing the following activities:  Hearing? 0 0  Vision? 0 0  Difficulty concentrating or making decisions? 0 0  Walking or climbing stairs? 0 0  Dressing or bathing? 0 0  Doing errands, shopping? 0   Preparing Food and eating ? N   Using the Toilet? N   In the past six months, have you accidently leaked urine? N   Do you have problems with loss of bowel control? N   Managing your Medications? N   Managing your Finances? N   Housekeeping or managing your Housekeeping? N     Patient Care Team: Venita Lick, NP as PCP - General (Nurse Practitioner) End, Harrell Gave, MD as PCP - Cardiology (Cardiology) Lollie Sails, MD (Inactive) as Consulting Physician (Gastroenterology) Minor, Dalbert Garnet, RN (Inactive) as [place] any recent [place] you may have received from other than [facility] providers in the past year (date may be approximate).     Assessment:   This is a routine wellness examination for Jesse Norton.  Hearing/Vision screen Hearing Screening - Comments:: No hearing aids Vision Screening - Comments:: Up to date [place] eye  Dietary issues and exercise activities discussed: Current Exercise Habits: Home exercise routine, Type of exercise: yoga;walking;stretching, Time (Minutes): 30, Frequency (Times/Week): 4, Weekly Exercise (Minutes/Week): 120, Intensity: Mild   Goals Addressed             This Visit's Progress    Increase physical activity         Depression Screen    06/14/2022   11:09 AM 05/03/2022    9:19 AM 03/22/2022   11:02 AM 01/24/2022    8:12 AM 09/15/2021   10:12 AM 06/20/2021    9:07 AM 03/15/2021   11:05 AM  PHQ 2/9 Scores  PHQ - 2 Score '1 2 2 '$ 0 0 0 0  PHQ- 9 Score '4 4 4 '$ 0 0  0    Fall Risk    07/04/2022    1:27 PM 05/03/2022    9:18 AM 03/22/2022   11:02 AM 01/24/2022    8:12 AM 06/20/2021    9:06 AM  Fall Risk   Falls in the past year? 1 0 0 0 1  Comment     fell out of bed  Number falls in past yr: 0 0 0 0 0  Injury with Fall? 0 0 0 0 0  Risk for fall due to :  No Fall Risks No Fall Risks No Fall Risks Medication side effect  Follow up Falls evaluation completed;Education provided;Falls prevention discussed Falls evaluation completed Falls evaluation completed Falls evaluation completed Falls evaluation completed;Education provided;Falls prevention discussed    FALL RISK PREVENTION  PERTAINING TO THE HOME:  Any stairs in or around the home? Yes  If so, are there any without handrails? No  Home free of loose throw rugs in walkways, pet beds, electrical cords, etc? Yes  Adequate lighting in your home to reduce risk of falls? Yes   ASSISTIVE DEVICES UTILIZED TO PREVENT FALLS:  Life alert? No  Use of a cane,  walker or w/c? No  Grab bars in the bathroom? Yes  Shower chair or bench in shower? Yes  Elevated toilet seat or a handicapped toilet? Yes   TIMED UP AND GO:  Was the test performed? No .    Cognitive Function:        07/04/2022    1:29 PM 06/20/2021    9:10 AM 06/11/2020    9:09 AM 08/19/2019    2:23 PM 06/04/2019    9:01 AM  6CIT Screen  What Year? 0 points 0 points 0 points 0 points 0 points  What month? 0 points 0 points 0 points 0 points 0 points  What time? 0 points 0 points 0 points 0 points 0 points  Count back from 20 0 points 0 points 0 points 0 points 0 points  Months in reverse 0 points 0 points 0 points 0 points 0 points  Repeat phrase 0 points 0 points 0 points 0 points 0 points  Total Score 0 points 0 points 0 points 0 points 0 points    Immunizations Immunization History  Administered Date(s) Administered   Fluad Quad(high Dose 65+) 10/21/2019, 09/14/2020, 09/15/2021   Influenza, High Dose Seasonal PF 11/29/2016, 10/11/2017, 10/28/2018   Influenza,inj,Quad PF,6+ Mos 11/22/2015   Influenza-Unspecified 11/10/2014   PFIZER(Purple Top)SARS-COV-2 Vaccination 01/13/2020, 02/03/2020, 09/10/2020   Pneumococcal Conjugate-13 05/12/2014   Pneumococcal Polysaccharide-23 04/11/2010   Td 05/12/2014   Zoster, Live 10/08/2008    TDAP status: Up to date  Flu Vaccine status: Up to date  Pneumococcal vaccine status: Up to date  Covid-19 vaccine status: Declined, Education has been provided regarding the importance of this vaccine but patient still declined. Advised may receive this vaccine at local pharmacy or Health Dept.or vaccine clinic. Aware to provide a copy of the vaccination record if obtained from local pharmacy or Health Dept. Verbalized acceptance and understanding.  Qualifies for Shingles Vaccine? Yes   Zostavax completed Yes   Shingrix Completed?: No.    Education has been provided regarding the importance of this vaccine. Patient has been advised to call  insurance company to determine out of pocket expense if they have not yet received this vaccine. Advised may also receive vaccine at local pharmacy or Health Dept. Verbalized acceptance and understanding.  Screening Tests Health Maintenance  Topic Date Due   COVID-19 Vaccine (4 - Booster for Pfizer series) 07/20/2022 (Originally 11/05/2020)   Zoster Vaccines- Shingrix (1 of 2) 10/04/2022 (Originally 01/11/1965)   INFLUENZA VACCINE  07/11/2022   TETANUS/TDAP  05/12/2024   COLONOSCOPY (Pts 45-21yr Insurance coverage will need to be confirmed)  06/19/2025   Pneumonia Vaccine 76 Years old  Completed   Hepatitis C Screening  Completed   HPV VACCINES  Aged Out    Health Maintenance  There are no preventive care reminders to display for this patient.   Colorectal cancer screening: Type of screening: Colonoscopy. Completed 2023. Repeat every 3 years  Lung Cancer Screening: (Low Dose CT Chest recommended if Age 77-80years, 30 pack-year currently smoking OR have quit w/in 15years.) does not qualify.   Lung Cancer Screening Referral:  Additional Screening:  Hepatitis C Screening: does not qualify; Completed 2019  Vision Screening: Recommended annual ophthalmology exams for early detection of glaucoma and other disorders of the eye. Is the patient up to date with their annual eye exam?  Yes  Who is the provider or what is the name of the office in which the patient attends annual eye exams? [facility] If pt is not established with a provider, would they like to be referred to a provider to establish care? No .   Dental Screening: Recommended annual dental exams for proper oral hygiene  Community Resource Referral / Chronic Care Management: CRR required this visit?  No   CCM required this visit?  No      Plan:     I have personally reviewed and noted the following in the patient's chart:   Medical and social history Use of alcohol, tobacco or illicit drugs  Current  medications and supplements including opioid prescriptions. Patient is not currently taking opioid prescriptions. Functional ability and status Nutritional status Physical activity Advanced directives List of other physicians Hospitalizations, surgeries, and ER visits in previous 12 months Vitals Screenings to include cognitive, depression, and falls Referrals and appointments  In addition, I have reviewed and discussed with patient certain preventive protocols, quality metrics, and best practice recommendations. A written personalized care plan for preventive services as well as general preventive health recommendations were provided to patient.     Leroy Kennedy, LPN   [id number]   Nurse Notes:

## 2022-07-04 NOTE — Patient Instructions (Signed)
Jesse Norton , Thank you for taking time to come for your Medicare Wellness Visit. I appreciate your ongoing commitment to your health goals. Please review the following plan we discussed and let me know if I can assist you in the future.   Screening recommendations/referrals: Colonoscopy: up to date Recommended yearly ophthalmology/optometry visit for glaucoma screening and checkup Recommended yearly dental visit for hygiene and checkup  Vaccinations: Influenza vaccine: up to date Pneumococcal vaccine: up to date Tdap vaccine: up to date Shingles vaccine: Education provided    Advanced directives: on file  Conditions/risks identified:   Next appointment: 07-26-2022 @ 9:40  @ 9:40  Preventive Care 41 Years and Older, Male Preventive care refers to lifestyle choices and visits with your health care provider that can promote health and wellness. What does preventive care include? A yearly physical exam. This is also called an annual well check. Dental exams once or twice a year. Routine eye exams. Ask your health care provider how often you should have your eyes checked. Personal lifestyle choices, including: Daily care of your teeth and gums. Regular physical activity. Eating a healthy diet. Avoiding tobacco and drug use. Limiting alcohol use. Practicing safe sex. Taking low doses of aspirin every day. Taking vitamin and mineral supplements as recommended by your health care provider. What happens during an annual well check? The services and screenings done by your health care provider during your annual well check will depend on your age, overall health, lifestyle risk factors, and family history of disease. Counseling  Your health care provider may ask you questions about your: Alcohol use. Tobacco use. Drug use. Emotional well-being. Home and relationship well-being. Sexual activity. Eating habits. History of falls. Memory and ability to understand (cognition). Work  and work Statistician. Screening  You may have the following tests or measurements: Height, weight, and BMI. Blood pressure. Lipid and cholesterol levels. These may be checked every 5 years, or more frequently if you are over 30 years old. Skin check. Lung cancer screening. You may have this screening every year starting at age 54 if you have a 30-pack-year history of smoking and currently smoke or have quit within the past 15 years. Fecal occult blood test (FOBT) of the stool. You may have this test every year starting at age 71. Flexible sigmoidoscopy or colonoscopy. You may have a sigmoidoscopy every 5 years or a colonoscopy every 10 years starting at age 59. Prostate cancer screening. Recommendations will vary depending on your family history and other risks. Hepatitis C blood test. Hepatitis B blood test. Sexually transmitted disease (STD) testing. Diabetes screening. This is done by checking your blood sugar (glucose) after you have not eaten for a while (fasting). You may have this done every 1-3 years. Abdominal aortic aneurysm (AAA) screening. You may need this if you are a current or former smoker. Osteoporosis. You may be screened starting at age 62 if you are at high risk. Talk with your health care provider about your test results, treatment options, and if necessary, the need for more tests. Vaccines  Your health care provider may recommend certain vaccines, such as: Influenza vaccine. This is recommended every year. Tetanus, diphtheria, and acellular pertussis (Tdap, Td) vaccine. You may need a Td booster every 10 years. Zoster vaccine. You may need this after age 62. Pneumococcal 13-valent conjugate (PCV13) vaccine. One dose is recommended after age 36. Pneumococcal polysaccharide (PPSV23) vaccine. One dose is recommended after age 32. Talk to your health care provider about which screenings  and vaccines you need and how often you need them. This information is not intended  to replace advice given to you by your health care provider. Make sure you discuss any questions you have with your health care provider. Document Released: 12/24/2015 Document Revised: 08/16/2016 Document Reviewed: 09/28/2015 Elsevier Interactive Patient Education  2017 [place] Prevention in the Home Falls can cause injuries. They can happen to people of all ages. There are many things you can do to make your home safe and to help prevent falls. What can I do on the outside of my home? Regularly fix the edges of walkways and driveways and fix any cracks. Remove anything that might make you trip as you walk through a door, such as a raised step or threshold. Trim any bushes or trees on the path to your home. Use bright outdoor lighting. Clear any walking paths of anything that might make someone trip, such as rocks or tools. Regularly check to see if handrails are loose or broken. Make sure that both sides of any steps have handrails. Any raised decks and porches should have guardrails on the edges. Have any leaves, snow, or ice cleared regularly. Use sand or salt on walking paths during winter. Clean up any spills in your garage right away. This includes oil or grease spills. What can I do in the bathroom? Use night lights. Install grab bars by the toilet and in the tub and shower. Do not use towel bars as grab bars. Use non-skid mats or decals in the tub or shower. If you need to sit down in the shower, use a plastic, non-slip stool. Keep the floor dry. Clean up any water that spills on the floor as soon as it happens. Remove soap buildup in the tub or shower regularly. Attach bath mats securely with double-sided non-slip rug tape. Do not have throw rugs and other things on the floor that can make you trip. What can I do in the bedroom? Use night lights. Make sure that you have a light by your bed that is easy to reach. Do not use any sheets or blankets that are too big  for your bed. They should not hang down onto the floor. Have a firm chair that has side arms. You can use this for support while you get dressed. Do not have throw rugs and other things on the floor that can make you trip. What can I do in the kitchen? Clean up any spills right away. Avoid walking on wet floors. Keep items that you use a lot in easy-to-reach places. If you need to reach something above you, use a strong step stool that has a grab bar. Keep electrical cords out of the way. Do not use floor polish or wax that makes floors slippery. If you must use wax, use non-skid floor wax. Do not have throw rugs and other things on the floor that can make you trip. What can I do with my stairs? Do not leave any items on the stairs. Make sure that there are handrails on both sides of the stairs and use them. Fix handrails that are broken or loose. Make sure that handrails are as long as the stairways. Check any carpeting to make sure that it is firmly attached to the stairs. Fix any carpet that is loose or worn. Avoid having throw rugs at the top or bottom of the stairs. If you do have throw rugs, attach them to the floor with carpet tape.  Make sure that you have a light switch at the top of the stairs and the bottom of the stairs. If you do not have them, ask someone to add them for you. What else can I do to help prevent falls? Wear shoes that: Do not have high heels. Have rubber bottoms. Are comfortable and fit you well. Are closed at the toe. Do not wear sandals. If you use a stepladder: Make sure that it is fully opened. Do not climb a closed stepladder. Make sure that both sides of the stepladder are locked into place. Ask someone to hold it for you, if possible. Clearly mark and make sure that you can see: Any grab bars or handrails. First and last steps. Where the edge of each step is. Use tools that help you move around (mobility aids) if they are needed. These  include: Canes. Walkers. Scooters. Crutches. Turn on the lights when you go into a dark area. Replace any light bulbs as soon as they burn out. Set up your furniture so you have a clear path. Avoid moving your furniture around. If any of your floors are uneven, fix them. If there are any pets around you, be aware of where they are. Review your medicines with your doctor. Some medicines can make you feel dizzy. This can increase your chance of falling. Ask your doctor what other things that you can do to help prevent falls. This information is not intended to replace advice given to you by your health care provider. Make sure you discuss any questions you have with your health care provider. Document Released: 09/23/2009 Document Revised: 05/04/2016 Document Reviewed: 01/01/2015 Elsevier Interactive Patient Education  2017 [employer].

## 2022-07-14 DIAGNOSIS — M50321 Other cervical disc degeneration at C4-C5 level: Secondary | ICD-10-CM | POA: Diagnosis not present

## 2022-07-14 DIAGNOSIS — M62838 Other muscle spasm: Secondary | ICD-10-CM | POA: Diagnosis not present

## 2022-07-14 DIAGNOSIS — M9901 Segmental and somatic dysfunction of cervical region: Secondary | ICD-10-CM | POA: Diagnosis not present

## 2022-07-14 DIAGNOSIS — M50322 Other cervical disc degeneration at C5-C6 level: Secondary | ICD-10-CM | POA: Diagnosis not present

## 2022-07-17 DIAGNOSIS — M62838 Other muscle spasm: Secondary | ICD-10-CM | POA: Diagnosis not present

## 2022-07-17 DIAGNOSIS — M50321 Other cervical disc degeneration at C4-C5 level: Secondary | ICD-10-CM | POA: Diagnosis not present

## 2022-07-17 DIAGNOSIS — M50322 Other cervical disc degeneration at C5-C6 level: Secondary | ICD-10-CM | POA: Diagnosis not present

## 2022-07-17 DIAGNOSIS — M9901 Segmental and somatic dysfunction of cervical region: Secondary | ICD-10-CM | POA: Diagnosis not present

## 2022-07-18 DIAGNOSIS — M50321 Other cervical disc degeneration at C4-C5 level: Secondary | ICD-10-CM | POA: Diagnosis not present

## 2022-07-18 DIAGNOSIS — M62838 Other muscle spasm: Secondary | ICD-10-CM | POA: Diagnosis not present

## 2022-07-18 DIAGNOSIS — M9901 Segmental and somatic dysfunction of cervical region: Secondary | ICD-10-CM | POA: Diagnosis not present

## 2022-07-18 DIAGNOSIS — M50322 Other cervical disc degeneration at C5-C6 level: Secondary | ICD-10-CM | POA: Diagnosis not present

## 2022-07-21 ENCOUNTER — Inpatient Hospital Stay: Payer: Medicare Other | Attending: Internal Medicine

## 2022-07-21 ENCOUNTER — Encounter: Payer: Self-pay | Admitting: Internal Medicine

## 2022-07-21 ENCOUNTER — Inpatient Hospital Stay (HOSPITAL_BASED_OUTPATIENT_CLINIC_OR_DEPARTMENT_OTHER): Payer: Medicare Other | Admitting: Internal Medicine

## 2022-07-21 DIAGNOSIS — I5032 Chronic diastolic (congestive) heart failure: Secondary | ICD-10-CM | POA: Insufficient documentation

## 2022-07-21 DIAGNOSIS — Z7902 Long term (current) use of antithrombotics/antiplatelets: Secondary | ICD-10-CM | POA: Insufficient documentation

## 2022-07-21 DIAGNOSIS — C884 Extranodal marginal zone B-cell lymphoma of mucosa-associated lymphoid tissue [MALT-lymphoma]: Secondary | ICD-10-CM

## 2022-07-21 DIAGNOSIS — Z8572 Personal history of non-Hodgkin lymphomas: Secondary | ICD-10-CM | POA: Diagnosis not present

## 2022-07-21 DIAGNOSIS — Z79899 Other long term (current) drug therapy: Secondary | ICD-10-CM | POA: Diagnosis not present

## 2022-07-21 DIAGNOSIS — E669 Obesity, unspecified: Secondary | ICD-10-CM | POA: Diagnosis not present

## 2022-07-21 DIAGNOSIS — I110 Hypertensive heart disease with heart failure: Secondary | ICD-10-CM | POA: Insufficient documentation

## 2022-07-21 LAB — LACTATE DEHYDROGENASE: LDH: 176 U/L (ref 98–192)

## 2022-07-21 NOTE — Assessment & Plan Note (Addendum)
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  Continue monitoring without imaging. Stable.    # Multiple colon polyps/Barrett's surveillance. [facility]- GI]-July 2023- Reviewed the EGD/pathology-no evidence of progression. Continue surveillance  # Obesity- [goal: 952]- slowly losing weight/weight watchers.  Congratulated patient.  # [id number]- ? [at home 130-80s]; question situational.  Monitor closely. STABLE.   *ptpref  # DISPOSITION: # follow up in 12 months MD/labs- cbc/cmp/ldh-Dr.B

## 2022-07-21 NOTE — Progress Notes (Signed)
[place] NOTE  Patient Care Team: Venita Lick, NP as PCP - General (Nurse Practitioner) End, Harrell Gave, MD as PCP - Cardiology (Cardiology) Lollie Sails, MD (Inactive) as Consulting Physician (Gastroenterology) Minor, Dalbert Garnet, RN (Inactive) as [place] Management Rogue Bussing, Elisha Headland, MD as Consulting Physician (Oncology)  CHIEF COMPLAINTS/PURPOSE OF CONSULTATION: Lymphoma  #  Oncology History Overview Note  # NOV 2019- MALT [II opinion at [facility] path]; s/p sigmoid colon polyp resection [Dr.Skulskie; incidental]; PET scan -NED  # Barrett's esophagus- 2018;#Colonic polyps surveillance/Dr. 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  Ambulating dependently.  Jesse Norton 76 y.o.  male is here for follow-up of his MALT lymphoma of the colon/polyp status post resection.  He has lost a couple of close friends recently quite emotional.  Patient denies any new lumps or bumps.  Denies any nausea vomiting.  No night sweats.  Patient has lost some weight since last visit.  He has been trying to be physically active.  Review of Systems  Constitutional:  Positive for malaise/fatigue. Negative for chills, diaphoresis, fever and weight loss.  HENT:  Negative for nosebleeds and sore throat.   Eyes:  Negative for double vision.  Respiratory:  Negative for cough, hemoptysis, sputum production, shortness of breath and wheezing.   Cardiovascular:  Negative for chest pain, palpitations, orthopnea  and leg swelling.  Gastrointestinal:  Negative for abdominal pain, blood in stool, constipation, diarrhea, heartburn, melena, nausea and vomiting.  Genitourinary:  Negative for dysuria, frequency and urgency.  Musculoskeletal:  Negative for back pain and joint pain.  Skin: Negative.  Negative for itching and rash.  Neurological:  Negative for dizziness, tingling, focal weakness, weakness and headaches.  Endo/Heme/Allergies:  Does not bruise/bleed easily.  Psychiatric/Behavioral:  Negative for depression. The patient is not nervous/anxious and does not have insomnia.      MEDICAL HISTORY:  Past Medical History:  Diagnosis Date   (HFpEF) heart failure with preserved ejection fraction ([place])    a. 01/2019 Cath: EF 55-60% w/ elevated LVEDP.   Anxiety    Barrett's esophagus    CAD (coronary artery disease)    a. 10/2018 ETT: 13m horizontal inflat ST depression, freq PVC's->Intermediate; b. 12/2018 Cardiac CTA: LM nl, LAD FFRct 0.6 in mLAD, LCX FFRct 0.99p, 0.832m0.77d, RCA no signif dzs, FFRct 0.89d->Rec cath; c. 01/2019 PCI: LM nl, LAD 40p, 8550m2 70, LCX 70p (2.75x15 Resolute Onyx DES), RCA min irregs. EF 55-65%. EDP 25-61m57m d. 01/2019 PCI: LAD 80-90 (atherectomy & 3x15 Resolute Onyx DES).   Colon polyps    Duodenal adenoma    Gastritis    GERD (gastroesophageal reflux disease)    Gout    H. pylori infection    H/O urethral stricture    Hemorrhoids    Hiatal hernia    Hypertension    Stomach ulcer     SURGICAL HISTORY: Past Surgical History:  Procedure Laterality Date   CARDIAC CATHETERIZATION     CATARACT EXTRACTION W/PHACO Left 03/07/2022  Procedure: CATARACT EXTRACTION PHACO AND INTRAOCULAR LENS PLACEMENT ([place]) LEFT VIVITY TORIC LENS 10.41 00:55.7;  Surgeon: Birder Robson, MD;  Location: [place];  Service: Ophthalmology;  Laterality: Left;   CATARACT EXTRACTION W/PHACO Right 03/28/2022   Procedure: CATARACT EXTRACTION PHACO AND INTRAOCULAR LENS PLACEMENT ([place])  RIGHT VIVITY LENS 6.68 00:50.0;  Surgeon: Birder Robson, MD;  Location: [place];  Service: Ophthalmology;  Laterality: Right;   COLONOSCOPY     COLONOSCOPY N/A 06/19/2022   Procedure: COLONOSCOPY;  Surgeon: Annamaria Helling, DO;  Location: [facility] ENDOSCOPY;  Service: Gastroenterology;  Laterality: N/A;   COLONOSCOPY WITH PROPOFOL N/A 08/20/2018   Procedure: COLONOSCOPY WITH PROPOFOL;  Surgeon: Lollie Sails, MD;  Location: [facility] ENDOSCOPY;  Service: Endoscopy;  Laterality: N/A;   COLONOSCOPY WITH PROPOFOL N/A 12/19/2018   Procedure: COLONOSCOPY WITH PROPOFOL;  Surgeon: Lollie Sails, MD;  Location: [facility] ENDOSCOPY;  Service: Endoscopy;  Laterality: N/A;   CORONARY ANGIOPLASTY     CORONARY ATHERECTOMY N/A 02/03/2019   Procedure: CORONARY ATHERECTOMY;  Surgeon: Nelva Bush, MD;  Location: [place] CV LAB;  Service: Cardiovascular;  Laterality: N/A;   CORONARY STENT INTERVENTION N/A 01/21/2019   Procedure: CORONARY STENT INTERVENTION;  Surgeon: Nelva Bush, MD;  Location: [place] CV LAB;  Service: Cardiovascular;  Laterality: N/A;   CORONARY STENT INTERVENTION N/A 02/03/2019   Procedure: CORONARY STENT INTERVENTION;  Surgeon: Nelva Bush, MD;  Location: [place] CV LAB;  Service: Cardiovascular;  Laterality: N/A;   CYSTOSCOPY WITH URETHRAL DILATATION N/A 08/16/2018   Procedure: CYSTOSCOPY WITH URETHRAL DILATATION;  Surgeon: [name], MD;  Location: [facility] ORS;  Service: Urology;  Laterality: N/A;   ESOPHAGOGASTRODUODENOSCOPY N/A 01/29/2017   Procedure: ESOPHAGOGASTRODUODENOSCOPY (EGD);  Surgeon: Lollie Sails, MD;  Location: [facility] ENDOSCOPY;  Service: Endoscopy;  Laterality: N/A;   ESOPHAGOGASTRODUODENOSCOPY N/A 06/19/2022   Procedure: ESOPHAGOGASTRODUODENOSCOPY (EGD);  Surgeon: Annamaria Helling, DO;  Location: [facility] ENDOSCOPY;  Service: Gastroenterology;  Laterality: N/A;   ESOPHAGOGASTRODUODENOSCOPY (EGD) WITH PROPOFOL N/A  07/10/2016   Procedure: ESOPHAGOGASTRODUODENOSCOPY (EGD) WITH PROPOFOL;  Surgeon: Lollie Sails, MD;  Location: [facility] ENDOSCOPY;  Service: Endoscopy;  Laterality: N/A;   ESOPHAGOGASTRODUODENOSCOPY (EGD) WITH PROPOFOL N/A 12/19/2018   Procedure: ESOPHAGOGASTRODUODENOSCOPY (EGD) WITH PROPOFOL;  Surgeon: Lollie Sails, MD;  Location: [facility] ENDOSCOPY;  Service: Endoscopy;  Laterality: N/A;   EYE SURGERY     gastritis     h.pylori     INTRAVASCULAR ULTRASOUND/IVUS N/A 02/03/2019   Procedure: Intravascular Ultrasound/IVUS;  Surgeon: Nelva Bush, MD;  Location: [place] CV LAB;  Service: Cardiovascular;  Laterality: N/A;   left elbow repair Left    LEFT HEART CATH AND CORONARY ANGIOGRAPHY Left 01/21/2019   Procedure: LEFT HEART CATH AND CORONARY ANGIOGRAPHY;  Surgeon: Nelva Bush, MD;  Location: [place] CV LAB;  Service: Cardiovascular;  Laterality: Left;   SPLENECTOMY  1957   TONSILLECTOMY      SOCIAL HISTORY: Social History   Socioeconomic History   Marital status: Married    Spouse name: Not on file   Number of children: Not on file   Years of education: Not on file   Highest education level: Master's degree (e.g., MA, MS, MEng, MEd, MSW, MBA)  Occupational History   Occupation: retired   Tobacco Use   Smoking status: Never   Smokeless tobacco: Never  Vaping Use   Vaping Use: Never used  Substance and Sexual Activity   Alcohol use: Yes    Alcohol/week: 5.0 standard drinks of alcohol  Types: 5 Standard drinks or equivalent per week    Comment: moderate-"a couple of glasses of wine each night"   Drug use: No   Sexual activity: Yes  Other Topics Concern   Not on file  Social History Narrative   Rides bicycle with friends       No smoking.  Occasional alcohol.  Lives at home.  Retired Customer service manager.   Social Determinants of Health   Financial Resource Strain: Low Risk  (07/04/2022)   Overall Financial Resource Strain (CARDIA)    Difficulty of Paying  Living Expenses: Not hard at all  Food Insecurity: No Food Insecurity (07/04/2022)   Hunger Vital Sign    Worried About Running Out of Food in the Last Year: Never true    Ran Out of Food in the Last Year: Never true  Transportation Needs: No Transportation Needs (07/04/2022)   PRAPARE - Hydrologist (Medical): No    Lack of Transportation (Non-Medical): No  Physical Activity: Insufficiently Active (07/04/2022)   Exercise Vital Sign    Days of Exercise per Week: 3 days    Minutes of Exercise per Session: 30 min  Stress: No Stress Concern Present (07/04/2022)   [place]    Feeling of Stress : Not at all  Social Connections: Unknown (07/04/2022)   Social Connection and Isolation Panel [NHANES]    Frequency of Communication with Friends and Family: Not on file    Frequency of Social Gatherings with Friends and Family: Once a week    Attends Religious Services: More than 4 times per year    Active Member of [employer] or Organizations: No    Attends Archivist Meetings: Never    Marital Status: Married  Human resources officer Violence: Not At Risk (07/04/2022)   Humiliation, Afraid, Rape, and Kick questionnaire    Fear of Current or Ex-Partner: No    Emotionally Abused: No    Physically Abused: No    Sexually Abused: No    FAMILY HISTORY: Family History  Adopted: Yes  Problem Relation Age of Onset   Heart attack Mother 16   Pancreatic cancer Father        died in WWII - pt adopted   Hypertension Sister    Hyperlipidemia Sister    Hypertension Brother    Hyperlipidemia Brother    Prostate cancer Neg Hx    Kidney cancer Neg Hx    Bladder Cancer Neg Hx     ALLERGIES:  has No Known Allergies.  MEDICATIONS:  Current Outpatient Medications  Medication Sig Dispense Refill   allopurinol (ZYLOPRIM) 300 MG tablet Take 1 tablet (300 mg total) by mouth 3 (three) times a week. 90 tablet 4    amLODipine (NORVASC) 10 MG tablet TAKE 1 TABLET(10 MG) BY MOUTH DAILY 90 tablet 4   atorvastatin (LIPITOR) 40 MG tablet Take 1 tablet (40 mg total) by mouth daily. 90 tablet 4   clobetasol ointment (TEMOVATE) 0.05 % Apply topically as needed.     clopidogrel (PLAVIX) 75 MG tablet Take 1 tablet (75 mg total) by mouth daily with breakfast. 90 tablet 4   furosemide (LASIX) 20 MG tablet Take 1 tablet (20 mg total) by mouth daily. 90 tablet 2   LORazepam (ATIVAN) 1 MG tablet Take 0.5-1 tablets (0.5-1 mg total) by mouth daily as needed for anxiety. 30 tablet 1   losartan (COZAAR) 100 MG tablet TAKE 1 TABLET(100 MG) BY MOUTH DAILY  90 tablet 4   losartan-hydrochlorothiazide (HYZAAR) 100-12.5 MG tablet Take 1 tablet by mouth daily.     metoprolol tartrate (LOPRESSOR) 25 MG tablet TAKE 1 TABLET(25 MG) BY MOUTH TWICE DAILY 180 tablet 4   nitroGLYCERIN (NITROSTAT) 0.4 MG SL tablet Place 1 tablet (0.4 mg total) under the tongue every 5 (five) minutes as needed for chest pain. Maximum of 3 doses. 25 tablet 3   OVER THE COUNTER MEDICATION AGI (agricultural greens) powder form by mouth once daily     pantoprazole (PROTONIX) 20 MG tablet Take 1 tablet (20 mg total) by mouth 2 (two) times daily. 180 tablet 4   Semaglutide,0.25 or 0.'5MG'$ /DOS, (OZEMPIC, 0.25 OR 0.5 MG/DOSE,) 2 MG/1.5ML SOPN Start with 0.25 MG into skin once a week x 4 weeks, then increase to 0.5 MG into the skin weekly. 1.5 mL 4   No current facility-administered medications for this visit.      Marland Kitchen  PHYSICAL EXAMINATION: ECOG PERFORMANCE STATUS: 0 - Asymptomatic  Vitals:   07/21/22 0927  BP: (!) 154/86  Pulse: 84  Temp: (!) 97 F (36.1 C)  SpO2: 98%   Filed Weights   07/21/22 0927  Weight: 257 lb 6.4 oz (116.8 kg)    Physical Exam HENT:     Head: Normocephalic and atraumatic.     Mouth/Throat:     Pharynx: No oropharyngeal exudate.  Eyes:     Pupils: Pupils are equal, round, and reactive to light.  Cardiovascular:     Rate and  Rhythm: Normal rate and regular rhythm.  Pulmonary:     Effort: No respiratory distress.     Breath sounds: No wheezing.  Abdominal:     General: Bowel sounds are normal. There is no distension.     Palpations: Abdomen is soft. There is no mass.     Tenderness: There is no abdominal tenderness. There is no guarding or rebound.  Musculoskeletal:        General: No tenderness. Normal range of motion.     Cervical back: Normal range of motion and neck supple.  Skin:    General: Skin is warm.  Neurological:     Mental Status: He is alert and oriented to person, place, and time.  Psychiatric:        Mood and Affect: Affect normal.      LABORATORY DATA:  I have reviewed the data as listed Lab Results  Component Value Date   WBC 7.0 09/15/2021   HGB 14.2 09/15/2021   HCT 42.1 09/15/2021   MCV 103 (H) 09/15/2021   PLT 295 09/15/2021   Recent Labs    07/22/21 1014 09/15/21 1049  NA 138 141  K 3.5 4.3  CL 99 98  CO2 26 27  GLUCOSE 139* 102*  BUN 19 20  CREATININE 0.95 0.94  CALCIUM 9.0 9.8  GFRNONAA >60  --   PROT 7.7 7.0  ALBUMIN 4.4 4.6  AST 57* 38  ALT 34 28  ALKPHOS 68 84  BILITOT 1.3* 1.0    RADIOGRAPHIC STUDIES: I have personally reviewed the radiological images as listed and agreed with the findings in the report. No results found.  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  Continue monitoring without imaging. Stable.    # Multiple colon polyps/Barrett's surveillance. [facility]- GI]-July 2023- Reviewed the EGD/pathology-no evidence of progression. Continue surveillance  #  Obesity- [goal: 220]- slowly losing weight/weight watchers.  Congratulated patient.  # [id number]- ? [at home 130-80s]; question situational.  Monitor closely. STABLE.   *ptpref  #  DISPOSITION: # follow up in 12 months MD/labs- cbc/cmp/ldh-Dr.B    All questions were answered. The patient knows to call the clinic with any problems, questions or concerns.    Cammie Sickle, MD 07/21/2022 10:44 AM

## 2022-07-22 NOTE — Patient Instructions (Signed)

## 2022-07-26 ENCOUNTER — Encounter: Payer: Self-pay | Admitting: Nurse Practitioner

## 2022-07-26 ENCOUNTER — Ambulatory Visit (INDEPENDENT_AMBULATORY_CARE_PROVIDER_SITE_OTHER): Payer: Medicare Other | Admitting: Nurse Practitioner

## 2022-07-26 VITALS — BP 138/86 | HR 72 | Temp 97.6°F | Wt 248.0 lb

## 2022-07-26 DIAGNOSIS — I5032 Chronic diastolic (congestive) heart failure: Secondary | ICD-10-CM | POA: Diagnosis not present

## 2022-07-26 DIAGNOSIS — I25118 Atherosclerotic heart disease of native coronary artery with other forms of angina pectoris: Secondary | ICD-10-CM

## 2022-07-26 DIAGNOSIS — C884 Extranodal marginal zone b-cell lymphoma of mucosa-associated lymphoid tissue (malt-lymphoma) not having achieved remission: Secondary | ICD-10-CM

## 2022-07-26 DIAGNOSIS — Z79899 Other long term (current) drug therapy: Secondary | ICD-10-CM

## 2022-07-26 DIAGNOSIS — I10 Essential (primary) hypertension: Secondary | ICD-10-CM

## 2022-07-26 DIAGNOSIS — F419 Anxiety disorder, unspecified: Secondary | ICD-10-CM

## 2022-07-26 DIAGNOSIS — R7303 Prediabetes: Secondary | ICD-10-CM | POA: Diagnosis not present

## 2022-07-26 DIAGNOSIS — E785 Hyperlipidemia, unspecified: Secondary | ICD-10-CM

## 2022-07-26 DIAGNOSIS — D692 Other nonthrombocytopenic purpura: Secondary | ICD-10-CM | POA: Diagnosis not present

## 2022-07-26 DIAGNOSIS — F339 Major depressive disorder, recurrent, unspecified: Secondary | ICD-10-CM

## 2022-07-26 MED ORDER — LORAZEPAM 1 MG PO TABS: 0.5000 mg | ORAL_TABLET | Freq: Every day | ORAL | 2 refills | Status: DC | PRN

## 2022-07-26 MED ORDER — OZEMPIC (0.25 OR 0.5 MG/DOSE) 2 MG/1.5ML ~~LOC~~ SOPN
PEN_INJECTOR | SUBCUTANEOUS | 5 refills | Status: DC
Start: 2022-07-26 — End: 2023-01-26

## 2022-07-26 NOTE — Assessment & Plan Note (Signed)
BMI 34.59 with underlying cardiac issues and prediabetes -- has lost 9 pounds with Ozempic for prediabetes and is tolerating well.  No family history of thyroid cancer and no personal history of pancreatitis.  Educated on this medication at length.  Recommended eating smaller high protein, low fat meals more frequently and exercising 30 mins a day 5 times a week with a goal of 10-15lb weight loss in the next 3 months. Patient voiced their understanding and motivation to adhere to these recommendations.

## 2022-07-26 NOTE — Progress Notes (Signed)
BP 138/86 (BP Location: Left Arm, Patient Position: Sitting, Cuff Size: Large)   Pulse 72   Temp 97.6 F (36.4 C) (Oral)   Wt 248 lb (112.5 kg)   SpO2 97%   BMI 34.59 kg/m    Subjective:    Patient ID: Jesse Norton, male    DOB: [birth year], 76 y.o.   MRN: [id number]  HPI: Jesse Norton is a 76 y.o. male  Chief Complaint  Patient presents with   Hypertension   Hyperlipidemia   Mood   Weight Check     6 weeks follow - patient states he has some questions regarding ozempic    HYPERTENSION / HYPERLIPIDEMIA/HF Continues on Losartan-HCTZ, Metoprolol, Lasix, Amlodipine, and Plavix + Atorvastatin.  Sees cardiology for CAD, HFpEF and HTN, last seen 06/07/22 -- no changes made.  No recent NTG use, has not used at all.   Taking Ozempic for elevated A1c (5.9%), started 06/14/22 and has lost 9 pounds.  Reports occasional nausea, takes crackers and ginger ale which eases this.  No other ADR.  Tolerating well.   Satisfied with current treatment? yes Duration of hypertension: chronic BP monitoring frequency: daily BP range: 135-145/75-80 range at home BP medication side effects: no Duration of hyperlipidemia: chronic Cholesterol medication side effects: no Cholesterol supplements: none Medication compliance: good compliance Aspirin: none Recent stressors: no Recurrent headaches: no Visual changes: no Palpitations: no Dyspnea: no Chest pain: no Lower extremity edema: no Dizzy/lightheaded: no    B-CELL LYMPHOMA: Followed by oncology and last seen 07/21/22.  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  Went for colonoscopy and EGD -- 06/19/22.  Remains stable on review records.  Denies B symptoms.  Needs CMP, CBC, and LDH today per oncology request.   ANXIETY/STRESS Continues on Ativan 0.5 to 1 MG as needed daily -- has had some increased stressors with loss of loved ones recently.  Takes Ativan as needed only, in about a week he  may takes 2-3 days a week.  Pt is aware of risks of benzo medication use to include increased sedation, respiratory suppression, falls, dependence and cardiovascular events.  Pt would like to continue treatment as benefit determined to outweigh risk.  PDMP review last Ativan fill 06/07/22 for #30 tablets.  Did try Zoloft in past without benefit. Duration: stable Anxious mood: yes  Excessive worrying: no Irritability: no Sweating: no Nausea: no Palpitations:no Hyperventilation: no Panic attacks: yes Agoraphobia: no  Obscessions/compulsions: no Depressed mood: no    07/26/2022    9:49 AM 06/14/2022   11:09 AM 05/03/2022    9:19 AM 03/22/2022   11:02 AM 01/24/2022    8:12 AM  Depression screen PHQ 2/9  Decreased Interest 0 '1 1 1 '$ 0  Down, Depressed, Hopeless 0  1 1 0  PHQ - 2 Score 0 '1 2 2 '$ 0  Altered sleeping 0 '1 1 1 '$ 0  Tired, decreased energy 1 1  0 0  Change in appetite 0 1  0 0  Feeling bad or failure about yourself  0 0 0 0 0  Trouble concentrating 0 0 1 1 0  Moving slowly or fidgety/restless 0 0 0 0 0  Suicidal thoughts 0 0 0 0 0  PHQ-9 Score '1 4 4 4 '$ 0  Difficult doing work/chores Not difficult at all Not difficult at all   Not difficult at all      07/26/2022    9:49 AM 06/14/2022   11:10 AM  05/03/2022    9:19 AM 03/22/2022   11:03 AM  GAD 7 : Generalized Anxiety Score  Nervous, Anxious, on Edge '1 1 1 1  '$ Control/stop worrying '1 1 1 '$ 0  Worry too much - different things 1 1  0  Trouble relaxing 1 0  1  Restless 0 1  0  Easily annoyed or irritable 0 0  0  Afraid - awful might happen 0 1  0  Total GAD 7 Score '4 5  2  '$ Anxiety Difficulty Not difficult at all Not difficult at all  Somewhat difficult    Relevant past medical, surgical, family and social history reviewed and updated as indicated. Interim medical history since our last visit reviewed. Allergies and medications reviewed and updated.  Review of Systems  Constitutional:  Negative for activity change, diaphoresis,  fatigue and fever.  Respiratory:  Negative for cough, chest tightness, shortness of breath and wheezing.   Cardiovascular:  Negative for chest pain, palpitations and leg swelling.  Gastrointestinal: Negative.   Neurological: Negative.   Psychiatric/Behavioral:  Negative for decreased concentration, self-injury, sleep disturbance and suicidal ideas. The patient is nervous/anxious.     Per HPI unless specifically indicated above     Objective:    BP 138/86 (BP Location: Left Arm, Patient Position: Sitting, Cuff Size: Large)   Pulse 72   Temp 97.6 F (36.4 C) (Oral)   Wt 248 lb (112.5 kg)   SpO2 97%   BMI 34.59 kg/m   Wt Readings from Last 3 Encounters:  07/26/22 248 lb (112.5 kg)  07/21/22 257 lb 6.4 oz (116.8 kg)  06/19/22 260 lb 8.6 oz (118.2 kg)    Physical Exam Vitals and nursing note reviewed.  Constitutional:      General: He is awake. He is not in acute distress.    Appearance: He is well-developed and well-groomed. He is morbidly obese. He is not ill-appearing.  HENT:     Head: Normocephalic and atraumatic.     Right Ear: Hearing normal. No drainage.     Left Ear: Hearing normal. No drainage.  Eyes:     General: Lids are normal.        Right eye: No discharge.        Left eye: No discharge.     Conjunctiva/sclera: Conjunctivae normal.     Pupils: Pupils are equal, round, and reactive to light.  Neck:     Vascular: No carotid bruit.     Trachea: Trachea normal.  Cardiovascular:     Rate and Rhythm: Normal rate and regular rhythm.     Heart sounds: Normal heart sounds, S1 normal and S2 normal. No murmur heard.    No gallop.  Pulmonary:     Effort: Pulmonary effort is normal. No accessory muscle usage or respiratory distress.     Breath sounds: Normal breath sounds.  Abdominal:     General: Bowel sounds are normal.     Palpations: Abdomen is soft.  Musculoskeletal:        General: Normal range of motion.     Cervical back: Normal range of motion and neck  supple.     Right lower leg: No edema.     Left lower leg: No edema.  Skin:    General: Skin is warm and dry.     Capillary Refill: Capillary refill takes less than 2 seconds.     Comments: Scattered pale purple bruises bilateral upper extremities noted.  Neurological:     Mental Status:  He is alert and oriented to person, place, and time.  Psychiatric:        Attention and Perception: Attention normal.        Mood and Affect: Mood normal.        Speech: Speech normal.        Behavior: Behavior normal. Behavior is cooperative.        Thought Content: Thought content normal.    Results for orders placed or performed in visit on 07/21/22  Lactate dehydrogenase  Result Value Ref Range   LDH 176 98 - 192 U/L      Assessment & Plan:   Problem List Items Addressed This Visit       Cardiovascular and Mediastinum   Chronic heart failure with preserved ejection fraction (HCC)    Chronic, stable with no recent exacerbations.  Euvolemic.  Continue current medication regimen and collaboration with cardiology.  Recent notes reviewed.  Recommend: - Reminded to call for an overnight weight gain of >2 pounds or a weekly weight weight of >5 pounds - not adding salt to his food and has been reading food labels. Reviewed the importance of keeping daily sodium intake to '2000mg'$  daily  - Avoid Ibuprofen containing products      Relevant Orders   Comprehensive metabolic panel   Lipid Panel w/o Chol/HDL Ratio   Coronary artery disease of native artery of native heart with stable angina pectoris (HCC)    Chronic, stable.  No recent NTG use.  Continue current medication regimen and collaboration with cardiology.  To alert provider immediately if CP presents or go immediately to ER.      Relevant Orders   Comprehensive metabolic panel   Lipid Panel w/o Chol/HDL Ratio   Essential hypertension    Chronic, ongoing with recheck at goal and home BP at goal.  Continue current medication regimen and  adjust as needed, sees cardiology, recent note reviewed. Labs: CBC and CMP.  He declines OSA testing.  Recommend he continue to monitor BP at home and document + focus on DASH diet and regular exercise.  Return in 6 months.      Relevant Orders   Comprehensive metabolic panel   Purpura senilis ([place])    Recommend gentle skin care at home and monitor for abrasions or skin breakdown, alert provider if present.        Other   Chronic anxiety    Chronic, ongoing.  At length discussion on risks of long term benzo use, he wishes to continue treatment and reports using minimally.  Refills sent #30 pills with 2 refill.  This often lasts 6 months.  Return in 6 months.  UDS due 03/23/23  and contract up to date.      Relevant Medications   LORazepam (ATIVAN) 1 MG tablet   Depression, recurrent (HCC) - Primary    Chronic, stable.  Denies SI/HI.  Continue current medication regimen and adjust as needed.  Recommend relaxation and meditation techniques at home.  Return in 6 months.      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  Continue collaboration with oncology, appreciate their ongoing input.  Recent note reviewed and will order CBC, CMP, LDH as requested by them.      Relevant Medications   LORazepam (ATIVAN) 1 MG tablet   Other Relevant Orders   CBC with Differential/Platelet   Lactate Dehydrogenase (  LDH)   Hyperlipidemia LDL goal <70    Chronic, ongoing.  Continue current medication and adjust as needed.  Lipid panel obtained today.      Relevant Orders   Comprehensive metabolic panel   Lipid Panel w/o Chol/HDL Ratio   Long-term current use of benzodiazepine    Discussed at length with patient and recommend continue to reduce use.  Refer to anxiety plan.  UDS due 03/23/23 and contract up to date.      Morbid obesity (HCC)    BMI 34.59 with  underlying cardiac issues and prediabetes -- has lost 9 pounds with Ozempic for prediabetes and is tolerating well.  No family history of thyroid cancer and no personal history of pancreatitis.  Educated on this medication at length.  Recommended eating smaller high protein, low fat meals more frequently and exercising 30 mins a day 5 times a week with a goal of 10-15lb weight loss in the next 3 months. Patient voiced their understanding and motivation to adhere to these recommendations.         Relevant Medications   Semaglutide,0.25 or 0.'5MG'$ /DOS, (OZEMPIC, 0.25 OR 0.5 MG/DOSE,) 2 MG/1.5ML SOPN   Prediabetes    Ongoing.  5.9% on recent labs, recheck today.  Is tolerating Ozempic well and has lost 9 pounds with this, which is beneficial for his overall cardiac health.  Continue this regimen, refill sent in.      Relevant Orders   HgB A1c     Follow up plan: Return in about 6 months (around 01/26/2023) for Annual Exam Medicare.

## 2022-07-26 NOTE — Assessment & Plan Note (Signed)
Chronic, ongoing.  At length discussion on risks of long term benzo use, he wishes to continue treatment and reports using minimally.  Refills sent #30 pills with 2 refill.  This often lasts 6 months.  Return in 6 months.  UDS due 03/23/23  and contract up to date.

## 2022-07-26 NOTE — Assessment & Plan Note (Signed)
Chronic, ongoing.  Continue collaboration with oncology, appreciate their ongoing input.  Recent note reviewed and will order CBC, CMP, LDH as requested by them.

## 2022-07-26 NOTE — Assessment & Plan Note (Signed)
Recommend gentle skin care at home and monitor for abrasions or skin breakdown, alert provider if present.

## 2022-07-26 NOTE — Assessment & Plan Note (Addendum)
Chronic, ongoing.  Continue current medication and adjust as needed.  Lipid panel obtained today. 

## 2022-07-26 NOTE — Assessment & Plan Note (Signed)
Discussed at length with patient and recommend continue to reduce use.  Refer to anxiety plan.  UDS due 03/23/23 and contract up to date.

## 2022-07-26 NOTE — Assessment & Plan Note (Signed)
Chronic, stable.  No recent NTG use.  Continue current medication regimen and collaboration with cardiology.  To alert provider immediately if CP presents or go immediately to ER. 

## 2022-07-26 NOTE — Assessment & Plan Note (Signed)
Ongoing.  5.9% on recent labs, recheck today.  Is tolerating Ozempic well and has lost 9 pounds with this, which is beneficial for his overall cardiac health.  Continue this regimen, refill sent in.

## 2022-07-26 NOTE — Assessment & Plan Note (Signed)
Chronic, stable.  Denies SI/HI.  Continue current medication regimen and adjust as needed.  Recommend relaxation and meditation techniques at home.  Return in 6 months. 

## 2022-07-26 NOTE — Assessment & Plan Note (Signed)
Chronic, ongoing with recheck at goal and home BP at goal.  Continue current medication regimen and adjust as needed, sees cardiology, recent note reviewed. Labs: CBC and CMP.  He declines OSA testing.  Recommend he continue to monitor BP at home and document + focus on DASH diet and regular exercise.  Return in 6 months.

## 2022-07-26 NOTE — Assessment & Plan Note (Signed)
Chronic, stable with no recent exacerbations.  Euvolemic.  Continue current medication regimen and collaboration with cardiology.  Recent notes reviewed.  Recommend: - Reminded to call for an overnight weight gain of >2 pounds or a weekly weight weight of >5 pounds - not adding salt to his food and has been reading food labels. Reviewed the importance of keeping daily sodium intake to <2000mg daily  - Avoid Ibuprofen containing products 

## 2022-07-27 LAB — CBC WITH DIFFERENTIAL/PLATELET
Basophils Absolute: 0.1 10*3/uL (ref 0.0–0.2)
Basos: 2 %
EOS (ABSOLUTE): 0.3 10*3/uL (ref 0.0–0.4)
Eos: 4 %
Hematocrit: 39.9 % (ref 37.5–51.0)
Hemoglobin: 13.8 g/dL (ref 13.0–17.7)
Immature Grans (Abs): 0 10*3/uL (ref 0.0–0.1)
Immature Granulocytes: 1 %
Lymphocytes Absolute: 1.5 10*3/uL (ref 0.7–3.1)
Lymphs: 24 %
MCH: 35.8 pg — ABNORMAL HIGH (ref 26.6–33.0)
MCHC: 34.6 g/dL (ref 31.5–35.7)
MCV: 103 fL — ABNORMAL HIGH (ref 79–97)
Monocytes Absolute: 0.9 10*3/uL (ref 0.1–0.9)
Monocytes: 14 %
Neutrophils Absolute: 3.4 10*3/uL (ref 1.4–7.0)
Neutrophils: 55 %
Platelets: 285 10*3/uL (ref 150–450)
RBC: 3.86 x10E6/uL — ABNORMAL LOW (ref 4.14–5.80)
RDW: 12.1 % (ref 11.6–15.4)
WBC: 6.3 10*3/uL (ref 3.4–10.8)

## 2022-07-27 LAB — COMPREHENSIVE METABOLIC PANEL
ALT: 43 IU/L (ref 0–44)
AST: 53 IU/L — ABNORMAL HIGH (ref 0–40)
Albumin/Globulin Ratio: 2.2 (ref 1.2–2.2)
Albumin: 4.9 g/dL — ABNORMAL HIGH (ref 3.8–4.8)
Alkaline Phosphatase: 74 IU/L (ref 44–121)
BUN/Creatinine Ratio: 17 (ref 10–24)
BUN: 14 mg/dL (ref 8–27)
Bilirubin Total: 0.6 mg/dL (ref 0.0–1.2)
CO2: 24 mmol/L (ref 20–29)
Calcium: 9.9 mg/dL (ref 8.6–10.2)
Chloride: 98 mmol/L (ref 96–106)
Creatinine, Ser: 0.83 mg/dL (ref 0.76–1.27)
Globulin, Total: 2.2 g/dL (ref 1.5–4.5)
Glucose: 98 mg/dL (ref 70–99)
Potassium: 3.9 mmol/L (ref 3.5–5.2)
Sodium: 141 mmol/L (ref 134–144)
Total Protein: 7.1 g/dL (ref 6.0–8.5)
eGFR: 90 mL/min/{1.73_m2} (ref >59)

## 2022-07-27 LAB — HEMOGLOBIN A1C
Est. average glucose Bld gHb Est-mCnc: 117 mg/dL
Hgb A1c MFr Bld: 5.7 % — ABNORMAL HIGH (ref 4.8–5.6)

## 2022-07-27 LAB — LIPID PANEL W/O CHOL/HDL RATIO
Cholesterol, Total: 119 mg/dL (ref 100–199)
HDL: 71 mg/dL (ref >39)
LDL Chol Calc (NIH): 35 mg/dL (ref 0–99)
Triglycerides: 59 mg/dL (ref 0–149)
VLDL Cholesterol Cal: 13 mg/dL (ref 5–40)

## 2022-07-27 LAB — LACTATE DEHYDROGENASE: LDH: 239 IU/L — ABNORMAL HIGH (ref 121–224)

## 2022-07-27 NOTE — Progress Notes (Signed)
Contacted via MyChart   Good morning Jesse Norton, your labs have returned: - A1c remains in prediabetes range at 5.7%, but has trended down.  Continue Ozempic - start 0.5 MG dosing. - Kidney function, creatinine and eGFR, remains normal.  Liver function shows mild elevation in AST, but normal ALT, we will continue to monitor and continue to work on weight loss:) - Cholesterol levels at goal.  Continue all medications.   - CBC stable, LDH mildly elevated.  Will forward to oncology.  Any questions? Keep being amazing!!  Thank you for allowing me to participate in your care.  I appreciate you. Kindest regards, Jolene 

## 2022-08-18 ENCOUNTER — Other Ambulatory Visit: Payer: Self-pay | Admitting: Nurse Practitioner

## 2022-08-21 NOTE — Telephone Encounter (Signed)
Requested Prescriptions  Pending Prescriptions Disp Refills  . losartan (COZAAR) 100 MG tablet [Pharmacy Med Name: LOSARTAN '100MG'$  TABLETS] 90 tablet 1    Sig: TAKE 1 TABLET(100 MG) BY MOUTH DAILY     Cardiovascular:  Angiotensin Receptor Blockers Passed - 08/18/2022 11:17 AM      Passed - Cr in normal range and within 180 days    Creatinine  Date Value Ref Range Status  03/15/2021 128.5 20.0 - 300.0 mg/dL Final  01/06/2014 1.13 0.60 - 1.30 mg/dL Final   Creatinine, Ser  Date Value Ref Range Status  07/26/2022 0.83 0.76 - 1.27 mg/dL Final         Passed - K in normal range and within 180 days    Potassium  Date Value Ref Range Status  07/26/2022 3.9 3.5 - 5.2 mmol/L Final  01/06/2014 4.0 3.5 - 5.1 mmol/L Final         Passed - Patient is not pregnant      Passed - Last BP in normal range    BP Readings from Last 1 Encounters:  07/26/22 138/86         Passed - Valid encounter within last 6 months    Recent Outpatient Visits          3 weeks ago Depression, recurrent ([place])   [place], Jolene T, NP   2 months ago Morbid obesity ([place])   [place] Cannady, [place] T, NP   3 months ago Morbid obesity ([place])   [place], Callaghan T, NP   5 months ago Depression, recurrent ([place])   [place], Barbaraann Faster, NP   6 months ago COVID-19   [facility] Kathrine Haddock, NP      Future Appointments            In 3 months End, Harrell Gave, MD [place]. [place]   In 5 months Cannady, [place] T, NP [facility]           . metoprolol tartrate (LOPRESSOR) 25 MG tablet [Pharmacy Med Name: METOPROLOL TARTRATE '25MG'$  TABLETS] 180 tablet 1    Sig: TAKE 1 TABLET BY MOUTH TWICE DAILY     Cardiovascular:  Beta Blockers Passed - 08/18/2022 11:17 AM      Passed - Last BP in normal range    BP Readings from Last 1 Encounters:  07/26/22  138/86         Passed - Last Heart Rate in normal range    Pulse Readings from Last 1 Encounters:  07/26/22 72         Passed - Valid encounter within last 6 months    Recent Outpatient Visits          3 weeks ago Depression, recurrent ([place])   [place], [place] T, NP   2 months ago Morbid obesity ([place])   [place], [place] T, NP   3 months ago Morbid obesity ([place])   [place], [place] T, NP   5 months ago Depression, recurrent ([place])   [place], Barbaraann Faster, NP   6 months ago COVID-19   [facility] Kathrine Haddock, NP      Future Appointments            In 3 months End, Harrell Gave, MD [place]. [place]  In 5 months Cannady, Barbaraann Faster, NP [facility]

## 2022-10-01 ENCOUNTER — Other Ambulatory Visit: Payer: Self-pay | Admitting: Nurse Practitioner

## 2022-10-01 DIAGNOSIS — I10 Essential (primary) hypertension: Secondary | ICD-10-CM

## 2022-10-03 NOTE — Telephone Encounter (Signed)
Requested Prescriptions  Pending Prescriptions Disp Refills  . clopidogrel (PLAVIX) 75 MG tablet [Pharmacy Med Name: CLOPIDOGREL '75MG'$  TABLETS] 90 tablet 3    Sig: TAKE 1 TABLET(75 MG) BY MOUTH DAILY WITH BREAKFAST     Hematology: Antiplatelets - clopidogrel Passed - 10/01/2022  1:18 PM      Passed - HCT in normal range and within 180 days    Hematocrit  Date Value Ref Range Status  07/26/2022 39.9 37.5 - 51.0 % Final         Passed - HGB in normal range and within 180 days    Hemoglobin  Date Value Ref Range Status  07/26/2022 13.8 13.0 - 17.7 g/dL Final         Passed - PLT in normal range and within 180 days    Platelets  Date Value Ref Range Status  07/26/2022 285 150 - 450 x10E3/uL Final         Passed - Cr in normal range and within 360 days    Creatinine  Date Value Ref Range Status  03/15/2021 128.5 20.0 - 300.0 mg/dL Final  01/06/2014 1.13 0.60 - 1.30 mg/dL Final   Creatinine, Ser  Date Value Ref Range Status  07/26/2022 0.83 0.76 - 1.27 mg/dL Final         Passed - Valid encounter within last 6 months    Recent Outpatient Visits          2 months ago Depression, recurrent ([place])   [place], Jolene T, NP   3 months ago Morbid obesity ([employer])   [place], [place] T, NP   5 months ago Morbid obesity ([place])   [place], [place] T, NP   6 months ago Depression, recurrent ([place])   [place], Barbaraann Faster, NP   8 months ago COVID-19   [facility] Kathrine Haddock, NP      Future Appointments            In 1 month End, Harrell Gave, MD [place]. [place]   In 3 months Cannady, [place] T, NP [facility]           . amLODipine (NORVASC) 10 MG tablet [Pharmacy Med Name: AMLODIPINE BESYLATE '10MG'$  TABLETS] 90 tablet 3    Sig: TAKE 1 TABLET(10 MG) BY MOUTH DAILY     Cardiovascular: Calcium Channel Blockers  2 Passed - 10/01/2022  1:18 PM      Passed - Last BP in normal range    BP Readings from Last 1 Encounters:  07/26/22 138/86         Passed - Last Heart Rate in normal range    Pulse Readings from Last 1 Encounters:  07/26/22 72         Passed - Valid encounter within last 6 months    Recent Outpatient Visits          2 months ago Depression, recurrent ([place])   [place], Jolene T, NP   3 months ago Morbid obesity ([place])   [place] Cannady, [place] T, NP   5 months ago Morbid obesity ([place])   [place], [place] T, NP   6 months ago Depression, recurrent ([place])   [place], Barbaraann Faster, NP   8 months ago [place] Kathrine Haddock, NP      Future Appointments  In 1 month End, Harrell Gave, MD [place]. [place]   In 3 months Cannady, Barbaraann Faster, NP [facility]

## 2022-11-14 ENCOUNTER — Ambulatory Visit (INDEPENDENT_AMBULATORY_CARE_PROVIDER_SITE_OTHER): Payer: Medicare Other | Admitting: Unknown Physician Specialty

## 2022-11-14 ENCOUNTER — Encounter: Payer: Self-pay | Admitting: Unknown Physician Specialty

## 2022-11-14 VITALS — BP 160/93 | Temp 97.9°F | Ht 69.0 in | Wt 240.7 lb

## 2022-11-14 DIAGNOSIS — J440 Chronic obstructive pulmonary disease with acute lower respiratory infection: Secondary | ICD-10-CM

## 2022-11-14 DIAGNOSIS — R051 Acute cough: Secondary | ICD-10-CM | POA: Diagnosis not present

## 2022-11-14 DIAGNOSIS — I25118 Atherosclerotic heart disease of native coronary artery with other forms of angina pectoris: Secondary | ICD-10-CM

## 2022-11-14 DIAGNOSIS — J209 Acute bronchitis, unspecified: Secondary | ICD-10-CM

## 2022-11-14 MED ORDER — GUAIFENESIN-CODEINE 100-10 MG/5ML PO SOLN: 10.0000 mL | Freq: Three times a day (TID) | ORAL | 0 refills | Status: DC | PRN

## 2022-11-14 MED ORDER — AZITHROMYCIN 250 MG PO TABS: ORAL_TABLET | ORAL | 0 refills | Status: DC

## 2022-11-14 NOTE — Progress Notes (Signed)
BP (!) 160/93   Temp 97.9 F (36.6 C) (Oral)   Ht _0  (1.753 m)   Wt 240 lb 11.2 oz (109.2 kg)   SpO2 98%   BMI 35.55 kg/m    Subjective:    Patient ID: Jesse Norton, male    DOB: 04-Mar-1946, 75 y.o.   MRN: [id number]  HPI: Jesse Norton is a 76 y.o. male  No chief complaint on file.  Cough This is a new problem. Episode onset: last 5 days. The problem has been gradually worsening. The problem occurs constantly. The cough is Productive of sputum. Associated symptoms include shortness of breath and wheezing. Pertinent negatives include no chest pain, chills, ear congestion, ear pain, fever, headaches, heartburn, hemoptysis, myalgias, nasal congestion, postnasal drip, rash, rhinorrhea, sore throat, sweats or weight loss. The symptoms are aggravated by lying down. He has tried nothing for the symptoms.     Relevant past medical, surgical, family and social history reviewed and updated as indicated. Interim medical history since our last visit reviewed. Allergies and medications reviewed and updated.  Review of Systems  Constitutional:  Negative for chills, fever and weight loss.  HENT:  Negative for ear pain, postnasal drip, rhinorrhea and sore throat.   Respiratory:  Positive for cough, shortness of breath and wheezing. Negative for hemoptysis.   Cardiovascular:  Negative for chest pain.  Gastrointestinal:  Negative for heartburn.  Musculoskeletal:  Negative for myalgias.  Skin:  Negative for rash.  Neurological:  Negative for headaches.    Per HPI unless specifically indicated above     Objective:    BP (!) 160/93   Temp 97.9 F (36.6 C) (Oral)   Ht _1  (1.753 m)   Wt 240 lb 11.2 oz (109.2 kg)   SpO2 98%   BMI 35.55 kg/m   Wt Readings from Last 3 Encounters:  11/14/22 240 lb 11.2 oz (109.2 kg)  07/26/22 248 lb (112.5 kg)  07/21/22 257 lb 6.4 oz (116.8 kg)    Physical Exam Constitutional:      General: He is not in acute distress.    Appearance:  Normal appearance. He is well-developed.  HENT:     Head: Normocephalic and atraumatic.  Eyes:     General: Lids are normal. No scleral icterus.       Right eye: No discharge.        Left eye: No discharge.     Conjunctiva/sclera: Conjunctivae normal.  Neck:     Vascular: No carotid bruit or JVD.  Cardiovascular:     Rate and Rhythm: Normal rate and regular rhythm.     Heart sounds: Normal heart sounds.  Pulmonary:     Effort: Pulmonary effort is normal. No respiratory distress.     Breath sounds: Normal breath sounds. No decreased air movement or transmitted upper airway sounds. No decreased breath sounds, wheezing, rhonchi or rales.  Abdominal:     Palpations: There is no hepatomegaly or splenomegaly.  Musculoskeletal:        General: Normal range of motion.     Cervical back: Normal range of motion and neck supple.  Skin:    General: Skin is warm and dry.     Coloration: Skin is not pale.     Findings: No rash.  Neurological:     Mental Status: He is alert and oriented to person, place, and time.  Psychiatric:        Behavior: Behavior normal.  Thought Content: Thought content normal.        Judgment: Judgment normal.     Results for orders placed or performed in visit on 07/26/22  HgB A1c  Result Value Ref Range   Hgb A1c MFr Bld 5.7 (H) 4.8 - 5.6 %   Est. average glucose Bld gHb Est-mCnc 117 mg/dL  Comprehensive metabolic panel  Result Value Ref Range   Glucose 98 70 - 99 mg/dL   BUN 14 8 - 27 mg/dL   Creatinine, Ser 0.83 0.76 - 1.27 mg/dL   eGFR 90 >59 mL/min/1.73   BUN/Creatinine Ratio 17 10 - 24   Sodium 141 134 - 144 mmol/L   Potassium 3.9 3.5 - 5.2 mmol/L   Chloride 98 96 - 106 mmol/L   CO2 24 20 - 29 mmol/L   Calcium 9.9 8.6 - 10.2 mg/dL   Total Protein 7.1 6.0 - 8.5 g/dL   Albumin 4.9 (H) 3.8 - 4.8 g/dL   Globulin, Total 2.2 1.5 - 4.5 g/dL   Albumin/Globulin Ratio 2.2 1.2 - 2.2   Bilirubin Total 0.6 0.0 - 1.2 mg/dL   Alkaline Phosphatase 74 44  - 121 IU/L   AST 53 (H) 0 - 40 IU/L   ALT 43 0 - 44 IU/L  Lipid Panel w/o Chol/HDL Ratio  Result Value Ref Range   Cholesterol, Total 119 100 - 199 mg/dL   Triglycerides 59 0 - 149 mg/dL   HDL 71 >39 mg/dL   VLDL Cholesterol Cal 13 5 - 40 mg/dL   LDL Chol Calc (NIH) 35 0 - 99 mg/dL  CBC with Differential/Platelet  Result Value Ref Range   WBC 6.3 3.4 - 10.8 x10E3/uL   RBC 3.86 (L) 4.14 - 5.80 x10E6/uL   Hemoglobin 13.8 13.0 - 17.7 g/dL   Hematocrit 39.9 37.5 - 51.0 %   MCV 103 (H) 79 - 97 fL   MCH 35.8 (H) 26.6 - 33.0 pg   MCHC 34.6 31.5 - 35.7 g/dL   RDW 12.1 11.6 - 15.4 %   Platelets 285 150 - 450 x10E3/uL   Neutrophils 55 Not Estab. %   Lymphs 24 Not Estab. %   Monocytes 14 Not Estab. %   Eos 4 Not Estab. %   Basos 2 Not Estab. %   Neutrophils Absolute 3.4 1.4 - 7.0 x10E3/uL   Lymphocytes Absolute 1.5 0.7 - 3.1 x10E3/uL   Monocytes Absolute 0.9 0.1 - 0.9 x10E3/uL   EOS (ABSOLUTE) 0.3 0.0 - 0.4 x10E3/uL   Basophils Absolute 0.1 0.0 - 0.2 x10E3/uL   Immature Granulocytes 1 Not Estab. %   Immature Grans (Abs) 0.0 0.0 - 0.1 x10E3/uL  Lactate Dehydrogenase (LDH)  Result Value Ref Range   LDH 239 (H) 121 - 224 IU/L      Assessment & Plan:   Problem List Items Addressed This Visit   None Visit Diagnoses     Acute cough    -  Primary   Robitussin with codeine for cough.   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 Worsening. Therefore will rx Z pack. Pt ed on rest and fluids   Relevant Medications   azithromycin (ZITHROMAX Z-PAK) 250 MG tablet   guaiFENesin-codeine 100-10 MG/5ML syrup        Follow up plan: Return if symptoms worsen or fail to improve.

## 2022-11-14 NOTE — Addendum Note (Signed)
Addended by: Kathrine Haddock on: 11/14/2022 09:32 AM   Modules accepted: Orders

## 2022-11-21 ENCOUNTER — Encounter: Payer: Self-pay | Admitting: Unknown Physician Specialty

## 2022-11-21 ENCOUNTER — Ambulatory Visit (INDEPENDENT_AMBULATORY_CARE_PROVIDER_SITE_OTHER): Payer: Medicare Other | Admitting: Unknown Physician Specialty

## 2022-11-21 VITALS — BP 144/81 | HR 72 | Temp 97.8°F | Wt 241.0 lb

## 2022-11-21 DIAGNOSIS — I25118 Atherosclerotic heart disease of native coronary artery with other forms of angina pectoris: Secondary | ICD-10-CM | POA: Diagnosis not present

## 2022-11-21 DIAGNOSIS — R051 Acute cough: Secondary | ICD-10-CM

## 2022-11-21 MED ORDER — BUDESONIDE-FORMOTEROL FUMARATE 160-4.5 MCG/ACT IN AERO: 2.0000 | INHALATION_SPRAY | Freq: Two times a day (BID) | RESPIRATORY_TRACT | 1 refills | Status: DC

## 2022-11-21 NOTE — Progress Notes (Signed)
BP (!) 144/81   Pulse 72   Temp 97.8 F (36.6 C) (Oral)   Wt 241 lb (109.3 kg)   SpO2 92%   BMI 35.59 kg/m    Subjective:    Patient ID: Jesse Norton, male    DOB: 1946/04/02, 76 y.o.   MRN: [id number]  HPI: Jesse Norton is a 76 y.o. male  Chief Complaint  Patient presents with   Cough    Pt states he is not getting any better from last week. States he finished medications prescribed but it still coughing and having chest congestion   Pt seen last week 12/5 for cough.  No help with Zpack and Robitussin.  Cough is not better and indeed worse.  Is SOB as coughs when breaths deeply.  Cough is dry.  Never smoked.  Chart review shows no ACE inhibitors.  Is on Losartan.    Relevant past medical, surgical, family and social history reviewed and updated as indicated. Interim medical history since our last visit reviewed. Allergies and medications reviewed and updated.  Review of Systems  Per HPI unless specifically indicated above     Objective:    BP (!) 144/81   Pulse 72   Temp 97.8 F (36.6 C) (Oral)   Wt 241 lb (109.3 kg)   SpO2 92%   BMI 35.59 kg/m   Wt Readings from Last 3 Encounters:  11/21/22 241 lb (109.3 kg)  11/14/22 240 lb 11.2 oz (109.2 kg)  07/26/22 248 lb (112.5 kg)    Physical Exam Constitutional:      General: He is not in acute distress.    Appearance: Normal appearance. He is well-developed.  HENT:     Head: Normocephalic and atraumatic.  Eyes:     General: Lids are normal. No scleral icterus.       Right eye: No discharge.        Left eye: No discharge.     Conjunctiva/sclera: Conjunctivae normal.  Neck:     Vascular: No carotid bruit or JVD.  Cardiovascular:     Rate and Rhythm: Normal rate and regular rhythm.     Heart sounds: Normal heart sounds.  Pulmonary:     Effort: Pulmonary effort is normal. No respiratory distress.     Breath sounds: Decreased air movement present. Examination of the right-lower field reveals decreased  breath sounds. Examination of the left-lower field reveals decreased breath sounds. Decreased breath sounds present.  Abdominal:     Palpations: There is no hepatomegaly or splenomegaly.  Musculoskeletal:        General: Normal range of motion.     Cervical back: Normal range of motion and neck supple.  Skin:    General: Skin is warm and dry.     Coloration: Skin is not pale.     Findings: No rash.  Neurological:     Mental Status: He is alert and oriented to person, place, and time.  Psychiatric:        Behavior: Behavior normal.        Thought Content: Thought content normal.        Judgment: Judgment normal.     Results for orders placed or performed in visit on 07/26/22  HgB A1c  Result Value Ref Range   Hgb A1c MFr Bld 5.7 (H) 4.8 - 5.6 %   Est. average glucose Bld gHb Est-mCnc 117 mg/dL  Comprehensive metabolic panel  Result Value Ref Range   Glucose 98 70 - 99  mg/dL   BUN 14 8 - 27 mg/dL   Creatinine, Ser 0.83 0.76 - 1.27 mg/dL   eGFR 90 >59 mL/min/1.73   BUN/Creatinine Ratio 17 10 - 24   Sodium 141 134 - 144 mmol/L   Potassium 3.9 3.5 - 5.2 mmol/L   Chloride 98 96 - 106 mmol/L   CO2 24 20 - 29 mmol/L   Calcium 9.9 8.6 - 10.2 mg/dL   Total Protein 7.1 6.0 - 8.5 g/dL   Albumin 4.9 (H) 3.8 - 4.8 g/dL   Globulin, Total 2.2 1.5 - 4.5 g/dL   Albumin/Globulin Ratio 2.2 1.2 - 2.2   Bilirubin Total 0.6 0.0 - 1.2 mg/dL   Alkaline Phosphatase 74 44 - 121 IU/L   AST 53 (H) 0 - 40 IU/L   ALT 43 0 - 44 IU/L  Lipid Panel w/o Chol/HDL Ratio  Result Value Ref Range   Cholesterol, Total 119 100 - 199 mg/dL   Triglycerides 59 0 - 149 mg/dL   HDL 71 >39 mg/dL   VLDL Cholesterol Cal 13 5 - 40 mg/dL   LDL Chol Calc (NIH) 35 0 - 99 mg/dL  CBC with Differential/Platelet  Result Value Ref Range   WBC 6.3 3.4 - 10.8 x10E3/uL   RBC 3.86 (L) 4.14 - 5.80 x10E6/uL   Hemoglobin 13.8 13.0 - 17.7 g/dL   Hematocrit 39.9 37.5 - 51.0 %   MCV 103 (H) 79 - 97 fL   MCH 35.8 (H) 26.6 - 33.0  pg   MCHC 34.6 31.5 - 35.7 g/dL   RDW 12.1 11.6 - 15.4 %   Platelets 285 150 - 450 x10E3/uL   Neutrophils 55 Not Estab. %   Lymphs 24 Not Estab. %   Monocytes 14 Not Estab. %   Eos 4 Not Estab. %   Basos 2 Not Estab. %   Neutrophils Absolute 3.4 1.4 - 7.0 x10E3/uL   Lymphocytes Absolute 1.5 0.7 - 3.1 x10E3/uL   Monocytes Absolute 0.9 0.1 - 0.9 x10E3/uL   EOS (ABSOLUTE) 0.3 0.0 - 0.4 x10E3/uL   Basophils Absolute 0.1 0.0 - 0.2 x10E3/uL   Immature Granulocytes 1 Not Estab. %   Immature Grans (Abs) 0.0 0.0 - 0.1 x10E3/uL  Lactate Dehydrogenase (LDH)  Result Value Ref Range   LDH 239 (H) 121 - 224 IU/L      Assessment & Plan:   Problem List Items Addressed This Visit   None Visit Diagnoses     Acute cough    -  Primary   Suspect post viral cough.  Symbicort 2 puffs bid.  Chest x-ray to r/o infection.   Relevant Orders   DG Chest 2 View        Follow up plan: Return if symptoms worsen or fail to improve.

## 2022-11-23 DIAGNOSIS — Z961 Presence of intraocular lens: Secondary | ICD-10-CM | POA: Diagnosis not present

## 2022-11-30 ENCOUNTER — Ambulatory Visit: Payer: Medicare Other | Attending: Internal Medicine | Admitting: Internal Medicine

## 2022-11-30 ENCOUNTER — Encounter: Payer: Self-pay | Admitting: Internal Medicine

## 2022-11-30 VITALS — BP 134/80 | HR 76 | Ht 68.0 in | Wt 256.0 lb

## 2022-11-30 DIAGNOSIS — R7303 Prediabetes: Secondary | ICD-10-CM | POA: Diagnosis not present

## 2022-11-30 DIAGNOSIS — I5032 Chronic diastolic (congestive) heart failure: Secondary | ICD-10-CM | POA: Diagnosis not present

## 2022-11-30 DIAGNOSIS — I25118 Atherosclerotic heart disease of native coronary artery with other forms of angina pectoris: Secondary | ICD-10-CM

## 2022-11-30 DIAGNOSIS — I10 Essential (primary) hypertension: Secondary | ICD-10-CM | POA: Diagnosis not present

## 2022-11-30 DIAGNOSIS — E785 Hyperlipidemia, unspecified: Secondary | ICD-10-CM

## 2022-11-30 NOTE — Patient Instructions (Signed)
Medication Instructions:  Your Physician recommend you continue on your current medication as directed.    *If you need a refill on your cardiac medications before your next appointment, please call your pharmacy*   Lab Work: None ordered today   Testing/Procedures: None ordered today   Follow-Up: At [facility], you and your health needs are our priority.  As part of our continuing mission to provide you with exceptional heart care, we have created designated Provider Care Teams.  These Care Teams include your primary Cardiologist (physician) and Advanced Practice Providers (APPs -  Physician Assistants and Nurse Practitioners) who all work together to provide you with the care you need, when you need it.  We recommend signing up for the patient portal called "MyChart".  Sign up information is provided on this After Visit Summary.  MyChart is used to connect with patients for Virtual Visits (Telemedicine).  Patients are able to view lab/test results, encounter notes, upcoming appointments, etc.  Non-urgent messages can be sent to your provider as well.   To learn more about what you can do with MyChart, go to [email or web address].    Your next appointment:   6 month(s)  The format for your next appointment:   In Person  Provider:   You may see Christopher End, MD or one of the following Advanced Practice Providers on your designated Care Team:   Christopher Berge, NP Ryan Dunn, PA-C Cadence Furth, PA-C Sheri Hammock, NP          

## 2022-11-30 NOTE — Progress Notes (Signed)
Follow-up Outpatient Visit Date: 11/30/2022  Primary Care Provider: [place] [id number]  Chief Complaint: Follow-up coronary artery disease and HFpEF  HPI:  Jesse Norton is a 76 y.o. male with history of coronary artery disease status post PCI to LCx and LAD, HFpEF, hypertension, Barrett's esophagus, MALT-lymphoma of colon, gout, BPH, and obesity, who presents for follow-up of coronary artery disease and HFpEF.  I last saw him in June, at which time Jesse Norton was feeling well.  He was working on weight loss through diet and exercise.  Jesse Norton had not been approved by his insurance.  We did not make any medication changes or pursue additional testing.  Today, Jesse Norton reports that he is doing fairly well.  He has recovered from bronchitis this fall.  He denies chest pain, shortness of breath, palpitations, lightheadedness, and edema.  He is now taking Ozempic and has continued to lose weight.  He is also still participating with weight watchers.  He is surprised by his weight in the office today, as his weight at home is typically around 240 pounds.  --------------------------------------------------------------------------------------------------  Cardiovascular History & Procedures: Cardiovascular Problems: Coronary artery disease HFpEF   Risk Factors: Known CAD, hypertension, male gender, obesity, and age greater than 51   Cath/PCI: PCI (02/03/2019): Severe, heavily calcified mid LAD stenosis (80-90%), unchanged from diagnostic catheterization earlier this month. Widely patent LCx stent. Normal left ventricular filling pressure. Successful orbital atherectomy and IVUS-guided PCI to the mid LAD using a Resolute Onyx 3.0 x 15 mm drug-eluting stent (postdilated to 3.6 mm) with 0% residual stenosis and TIMI-3 flow.   LHC/PCI (01/21/2019): Significant 2-vessel coronary artery disease, including 80-90% mid LAD stenosis with heavy calcification, as well  as 70% proximal LCx stenosis. Normal left ventricular systolic function. Moderately elevated left ventricular filling pressure. Successful PCI to proximal LCx using Resolute Onyx 2.75 x 15 mm drug-eluting stent (postdilated to 3.1 mm proximally) with 0% residual stenosis and TIMI-3 flow.   CV Surgery: None   EP Procedures and Devices: None   Non-Invasive Evaluation(s): Cardiac CTA (12/23/2018): Multivessel CAD with hemodynamically significant disease involving the mid LAD and distal LCx. Exercise tolerance test (11/06/2018): Intermediate risk study with 1 mm horizontal ST depressions and frequent PVCs during stress.  Recent CV Pertinent Labs: Lab Results  Component Value Date   CHOL 119 07/26/2022   CHOL 124 02/12/2019   HDL 71 07/26/2022   LDLCALC 35 07/26/2022   TRIG 59 07/26/2022   TRIG 58 02/12/2019   CHOLHDL 1.9 08/25/2020   K 3.9 07/26/2022   K 4.0 01/06/2014   MG 1.5 (L) 09/15/2021   BUN 14 07/26/2022   BUN 22 (H) 01/06/2014   CREATININE 0.83 07/26/2022   CREATININE 1.13 01/06/2014    Past medical and surgical history were reviewed and updated in EPIC.  Current Meds  Medication Sig   allopurinol (ZYLOPRIM) 300 MG tablet Take 1 tablet (300 mg total) by mouth 3 (three) times a week.   amLODipine (NORVASC) 10 MG tablet TAKE 1 TABLET(10 MG) BY MOUTH DAILY   atorvastatin (LIPITOR) 40 MG tablet Take 1 tablet (40 mg total) by mouth daily.   clobetasol ointment (TEMOVATE) 0.05 % Apply topically as needed.   clopidogrel (PLAVIX) 75 MG tablet TAKE 1 TABLET(75 MG) BY MOUTH DAILY WITH BREAKFAST   furosemide (LASIX) 20 MG tablet Take 1 tablet (20 mg total) by mouth daily.   LORazepam (ATIVAN) 1 MG tablet Take 0.5-1 tablets (0.5-1  mg total) by mouth daily as needed for anxiety.   losartan (COZAAR) 100 MG tablet TAKE 1 TABLET(100 MG) BY MOUTH DAILY   metoprolol tartrate (LOPRESSOR) 25 MG tablet TAKE 1 TABLET BY MOUTH TWICE DAILY   nitroGLYCERIN (NITROSTAT) 0.4 MG SL tablet Place  1 tablet (0.4 mg total) under the tongue every 5 (five) minutes as needed for chest pain. Maximum of 3 doses.   OVER THE COUNTER MEDICATION AGI (athletic greens) powder form by mouth once daily   pantoprazole (PROTONIX) 20 MG tablet Take 20 mg by mouth daily.   Semaglutide,0.25 or 0.'5MG'$ /DOS, (OZEMPIC, 0.25 OR 0.5 MG/DOSE,) 2 MG/1.5ML SOPN Inject 0.5 MG in the skin weekly for prediabetes.    Allergies: Patient has no known allergies.  Social History   Tobacco Use   Smoking status: Never   Smokeless tobacco: Never  Vaping Use   Vaping Use: Never used  Substance Use Topics   Alcohol use: Yes    Alcohol/week: 5.0 standard drinks of alcohol    Types: 5 Standard drinks or equivalent per week    Comment: moderate-"a couple of glasses of wine each night"   Drug use: No    Family History  Adopted: Yes  Problem Relation Age of Onset   Heart attack Mother 15   Pancreatic cancer Father        died in WWII - pt adopted   Hypertension Sister    Hyperlipidemia Sister    Hypertension Brother    Hyperlipidemia Brother    Prostate cancer Neg Hx    Kidney cancer Neg Hx    Bladder Cancer Neg Hx     Review of Systems: A 12-system review of systems was performed and was negative except as noted in the HPI.  --------------------------------------------------------------------------------------------------  Physical Exam: BP (!) 142/86 (BP Location: Left Arm, Patient Position: Sitting, Cuff Size: Large)   Pulse 76   Ht '5\' 8"'$  (1.727 m)   Wt 256 lb (116.1 kg)   SpO2 95%   BMI 38.92 kg/m  BP: 134/80  General:  NAD. Neck: No JVD or HJR. Lungs: Clear to auscultation bilaterally without wheezes or crackles. Heart: Regular rate and rhythm without murmurs, rubs, or gallops. Abdomen: Soft, nontender, nondistended. Extremities: Trace ankle edema bilaterally.  EKG: Normal sinus rhythm with nonspecific T wave abnormality.  No significant change since 06/07/2022.  Lab Results  Component  Value Date   WBC 6.3 07/26/2022   HGB 13.8 07/26/2022   HCT 39.9 07/26/2022   MCV 103 (H) 07/26/2022   PLT 285 07/26/2022    Lab Results  Component Value Date   NA 141 07/26/2022   K 3.9 07/26/2022   CL 98 07/26/2022   CO2 24 07/26/2022   BUN 14 07/26/2022   CREATININE 0.83 07/26/2022   GLUCOSE 98 07/26/2022   ALT 43 07/26/2022    Lab Results  Component Value Date   CHOL 119 07/26/2022   HDL 71 07/26/2022   LDLCALC 35 07/26/2022   TRIG 59 07/26/2022   CHOLHDL 1.9 08/25/2020    --------------------------------------------------------------------------------------------------  ASSESSMENT AND PLAN: Coronary artery disease with stable angina: Jesse Norton continues to do well without recurrent angina.  Continue current medications for secondary prevention, including clopidogrel and atorvastatin for secondary prevention and antianginal therapy with amlodipine and metoprolol..  Chronic HFpEF: Jesse Norton appears euvolemic with stable NYHA class I symptoms.  I encouraged him to continue working on weight loss through diet and exercise as well as with recently added Ozempic.  Continue low-dose  furosemide as well as blood pressure control with amlodipine, losartan, and metoprolol tartrate.  Hypertension: Blood pressure upper normal today.  We will defer medication changes and keep working on lifestyle modifications.  Hyperlipidemia: LDL at goal.  Continue atorvastatin for target LDL less than 55.  Prediabetes: Continue Ozempic and lifestyle modifications, with close follow-up with Ms. Cannady.  Follow-up: Return to clinic in 6 months.  Nelva Bush, MD 11/30/2022 10:38 AM

## 2022-12-01 ENCOUNTER — Encounter: Payer: Self-pay | Admitting: Internal Medicine

## 2022-12-04 ENCOUNTER — Other Ambulatory Visit: Payer: Self-pay | Admitting: Nurse Practitioner

## 2022-12-05 ENCOUNTER — Other Ambulatory Visit: Payer: Self-pay | Admitting: Nurse Practitioner

## 2022-12-07 NOTE — Telephone Encounter (Signed)
Duplicate request, will refuse this.  Requested Prescriptions  Pending Prescriptions Disp Refills   atorvastatin (LIPITOR) 40 MG tablet [Pharmacy Med Name: ATORVASTATIN '40MG'$  TABLETS] 90 tablet 4    Sig: TAKE 1 TABLET(40 MG) BY MOUTH DAILY     Cardiovascular:  Antilipid - Statins Failed - 12/05/2022  1:12 PM      Failed - Lipid Panel in normal range within the last 12 months    Cholesterol, Total  Date Value Ref Range Status  07/26/2022 119 100 - 199 mg/dL Final   Cholesterol Piccolo, Waived  Date Value Ref Range Status  02/12/2019 124 <200 mg/dL Final    Comment:                            Desirable                <200                         Borderline High      200- 239                         High                     >239    LDL Chol Calc (NIH)  Date Value Ref Range Status  07/26/2022 35 0 - 99 mg/dL Final   HDL  Date Value Ref Range Status  07/26/2022 71 >39 mg/dL Final   Triglycerides  Date Value Ref Range Status  07/26/2022 59 0 - 149 mg/dL Final   Triglycerides Piccolo,Waived  Date Value Ref Range Status  02/12/2019 58 <150 mg/dL Final    Comment:                            Normal                   <150                         Borderline High     150 - 199                         High                200 - 499                         Very High                >499          Passed - Patient is not pregnant      Passed - Valid encounter within last 12 months    Recent Outpatient Visits           2 weeks ago Acute cough   [facility] Kathrine Haddock, NP   3 weeks ago Acute cough   [facility] Kathrine Haddock, NP   4 months ago Depression, recurrent ([place])   [place], [place] T, NP   5 months ago Morbid obesity ([place])   [place], [place] T, NP   7 months ago Morbid obesity [facility])   [place], Barbaraann Faster, NP  Future Appointments             In 1 month  Cannady, Barbaraann Faster, NP [employer], [place]   In 5 months End, Harrell Gave, MD [place]. [place]

## 2022-12-07 NOTE — Telephone Encounter (Signed)
Requested Prescriptions  Pending Prescriptions Disp Refills   atorvastatin (LIPITOR) 40 MG tablet [Pharmacy Med Name: ATORVASTATIN '40MG'$  TABLETS] 90 tablet 2    Sig: TAKE 1 TABLET(40 MG) BY MOUTH DAILY     Cardiovascular:  Antilipid - Statins Failed - 12/05/2022  1:00 PM      Failed - Lipid Panel in normal range within the last 12 months    Cholesterol, Total  Date Value Ref Range Status  07/26/2022 119 100 - 199 mg/dL Final   Cholesterol Piccolo, Waived  Date Value Ref Range Status  02/12/2019 124 <200 mg/dL Final    Comment:                            Desirable                <200                         Borderline High      200- 239                         High                     >239    LDL Chol Calc (NIH)  Date Value Ref Range Status  07/26/2022 35 0 - 99 mg/dL Final   HDL  Date Value Ref Range Status  07/26/2022 71 >39 mg/dL Final   Triglycerides  Date Value Ref Range Status  07/26/2022 59 0 - 149 mg/dL Final   Triglycerides Piccolo,Waived  Date Value Ref Range Status  02/12/2019 58 <150 mg/dL Final    Comment:                            Normal                   <150                         Borderline High     150 - 199                         High                200 - 499                         Very High                >499          Passed - Patient is not pregnant      Passed - Valid encounter within last 12 months    Recent Outpatient Visits           2 weeks ago Acute cough   [facility] Kathrine Haddock, NP   3 weeks ago Acute cough   [facility] Kathrine Haddock, NP   4 months ago Depression, recurrent ([place])   [place], [place] T, NP   5 months ago Morbid obesity ([place])   [place], [place] T, NP   7 months ago Morbid obesity ([place])   [place], Barbaraann Faster, NP       Future Appointments  In 1 month Cannady, Barbaraann Faster, NP [employer]   In 5 months End, Harrell Gave, MD [place]. [place]

## 2023-01-21 NOTE — Patient Instructions (Signed)

## 2023-01-26 ENCOUNTER — Ambulatory Visit (INDEPENDENT_AMBULATORY_CARE_PROVIDER_SITE_OTHER): Payer: Medicare Other | Admitting: Nurse Practitioner

## 2023-01-26 ENCOUNTER — Encounter: Payer: Self-pay | Admitting: Nurse Practitioner

## 2023-01-26 VITALS — BP 138/86 | HR 80 | Temp 97.6°F | Ht 67.99 in | Wt 259.9 lb

## 2023-01-26 DIAGNOSIS — I5032 Chronic diastolic (congestive) heart failure: Secondary | ICD-10-CM

## 2023-01-26 DIAGNOSIS — F339 Major depressive disorder, recurrent, unspecified: Secondary | ICD-10-CM | POA: Diagnosis not present

## 2023-01-26 DIAGNOSIS — Z79899 Other long term (current) drug therapy: Secondary | ICD-10-CM | POA: Diagnosis not present

## 2023-01-26 DIAGNOSIS — C884 Extranodal marginal zone b-cell lymphoma of mucosa-associated lymphoid tissue (malt-lymphoma) not having achieved remission: Secondary | ICD-10-CM

## 2023-01-26 DIAGNOSIS — K2270 Barrett's esophagus without dysplasia: Secondary | ICD-10-CM | POA: Diagnosis not present

## 2023-01-26 DIAGNOSIS — F419 Anxiety disorder, unspecified: Secondary | ICD-10-CM

## 2023-01-26 DIAGNOSIS — I25118 Atherosclerotic heart disease of native coronary artery with other forms of angina pectoris: Secondary | ICD-10-CM

## 2023-01-26 DIAGNOSIS — M1A0720 Idiopathic chronic gout, left ankle and foot, without tophus (tophi): Secondary | ICD-10-CM

## 2023-01-26 DIAGNOSIS — E785 Hyperlipidemia, unspecified: Secondary | ICD-10-CM

## 2023-01-26 DIAGNOSIS — D692 Other nonthrombocytopenic purpura: Secondary | ICD-10-CM

## 2023-01-26 DIAGNOSIS — Z0000 Encounter for general adult medical examination without abnormal findings: Secondary | ICD-10-CM

## 2023-01-26 DIAGNOSIS — Z23 Encounter for immunization: Secondary | ICD-10-CM | POA: Diagnosis not present

## 2023-01-26 DIAGNOSIS — I10 Essential (primary) hypertension: Secondary | ICD-10-CM

## 2023-01-26 DIAGNOSIS — N400 Enlarged prostate without lower urinary tract symptoms: Secondary | ICD-10-CM

## 2023-01-26 DIAGNOSIS — Z6837 Body mass index (BMI) 37.0-37.9, adult: Secondary | ICD-10-CM

## 2023-01-26 DIAGNOSIS — R7303 Prediabetes: Secondary | ICD-10-CM

## 2023-01-26 MED ORDER — CLOPIDOGREL BISULFATE 75 MG PO TABS: ORAL_TABLET | ORAL | 4 refills | Status: DC

## 2023-01-26 MED ORDER — ALLOPURINOL 300 MG PO TABS: 300.0000 mg | ORAL_TABLET | ORAL | 4 refills | Status: DC

## 2023-01-26 MED ORDER — OZEMPIC (0.25 OR 0.5 MG/DOSE) 2 MG/1.5ML ~~LOC~~ SOPN: PEN_INJECTOR | SUBCUTANEOUS | 5 refills | Status: DC

## 2023-01-26 MED ORDER — LOSARTAN POTASSIUM 100 MG PO TABS: ORAL_TABLET | ORAL | 4 refills | Status: DC

## 2023-01-26 MED ORDER — FUROSEMIDE 20 MG PO TABS: 20.0000 mg | ORAL_TABLET | Freq: Every day | ORAL | 4 refills | Status: DC

## 2023-01-26 MED ORDER — AMLODIPINE BESYLATE 10 MG PO TABS: ORAL_TABLET | ORAL | 4 refills | Status: DC

## 2023-01-26 MED ORDER — ATORVASTATIN CALCIUM 40 MG PO TABS: ORAL_TABLET | ORAL | 4 refills | Status: DC

## 2023-01-26 NOTE — Assessment & Plan Note (Signed)
Chronic, stable.  Denies SI/HI.  Continue current medication regimen and adjust as needed.  Recommend relaxation and meditation techniques at home.  Return in 6 months.

## 2023-01-26 NOTE — Assessment & Plan Note (Signed)
Chronic, ongoing with recheck at goal and home BP at goal.  Continue current medication regimen and adjust as needed, sees cardiology, recent note reviewed. Labs: CBC, CMP, TSH, urine ALB.  He declines OSA testing.  Recommend he continue to monitor BP at home and document + focus on DASH diet and regular exercise.  Return in 6 months.

## 2023-01-26 NOTE — Assessment & Plan Note (Signed)
Refer to morbid obesity plan of care.

## 2023-01-26 NOTE — Assessment & Plan Note (Signed)
Chronic, ongoing.  Continue collaboration with oncology, appreciate their ongoing input.  Recent note reviewed and labs.

## 2023-01-26 NOTE — Assessment & Plan Note (Signed)
Chronic, stable.  No recent NTG use.  Continue current medication regimen and collaboration with cardiology.  To alert provider immediately if CP presents or go immediately to ER.

## 2023-01-26 NOTE — Assessment & Plan Note (Signed)
Chronic, stable with no recent exacerbations.  Euvolemic.  Continue current medication regimen and collaboration with cardiology.  Recent notes reviewed.  Recommend: - Reminded to call for an overnight weight gain of >2 pounds or a weekly weight weight of >5 pounds - not adding salt to his food and has been reading food labels. Reviewed the importance of keeping daily sodium intake to <2047m daily  - Avoid Ibuprofen containing products

## 2023-01-26 NOTE — Assessment & Plan Note (Signed)
Chronic, ongoing.  Has not used benzo in 6 months. UDS due obtained today and contract up to date. Will discontinue if remains off benzo.

## 2023-01-26 NOTE — Assessment & Plan Note (Signed)
Discussed at length with patient and recommend continue to reduce use.  Refer to anxiety plan.  UDS updated today and contract up to date. He has not taken in 6 months, weaning off.

## 2023-01-26 NOTE — Progress Notes (Signed)
BP 138/86 (BP Location: Left Arm, Patient Position: Sitting, Cuff Size: Large)   Pulse 80   Temp 97.6 F (36.4 C) (Oral)   Ht 5' 7.99" (1.727 m)   Wt 259 lb 14.4 oz (117.9 kg)   SpO2 95%   BMI 39.53 kg/m    Subjective:    Patient ID: Jesse Norton, male    DOB: [birth year], 77 y.o.   MRN: [id number]  HPI: Jesse Norton is a 77 y.o. male presenting on 01/26/2023 for annual exam. Current medical complaints include:none  He currently lives with: self Interim Problems from his last visit: no  HYPERTENSION / HYPERLIPIDEMIA/HF Continues on Losartan, Metoprolol, Lasix, Amlodipine, and Plavix + Atorvastatin.  Sees cardiology for CAD, HFpEF and HTN, last seen 11/30/22 -- no changes made.  No recent NTG use, has not used at all.  History of gout, continues on Allopurinol daily.   Taking Ozempic for elevated A1c (5.7% last in August), started 06/14/22 and has lost weight with this.  No current ADR with this.  Tolerating well.  Satisfied with current treatment? yes Duration of hypertension: chronic BP monitoring frequency: daily BP range: 120-135/70-80 BP medication side effects: no Duration of hyperlipidemia: chronic Cholesterol medication side effects: no Cholesterol supplements: none Medication compliance: good compliance Aspirin: none Recent stressors: no Recurrent headaches: no Visual changes: no Palpitations: no Dyspnea: no Chest pain: no Lower extremity edema: no Dizzy/lightheaded: no    B-CELL LYMPHOMA: Followed by oncology and last seen 07/21/22 -- sees them yearly.  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 Went for colonoscopy and EGD -- 06/19/22. Remains stable on review records.  Denies B symptoms.     ANXIETY/STRESS Continues on Ativan 0.5 to 1 MG as needed daily - currently has not used in 6 months and feels like he may no longer need it.  Has started doing meditation.  Pt is aware of risks of benzo medication  use to include increased sedation, respiratory suppression, falls, dependence and cardiovascular events.  Pt would like to continue treatment as benefit determined to outweigh risk.  PDMP review last Ativan fill 07/26/22 for #30 tablets.    Did try Zoloft in past without benefit. Duration: stable Anxious mood: no Excessive worrying: no Irritability: no Sweating: no Nausea: no Palpitations:no Hyperventilation: no Panic attacks: yes Agoraphobia: no  Obscessions/compulsions: no Depressed mood: no    11/14/2022    8:57 AM 07/26/2022    9:49 AM 06/14/2022   11:09 AM 05/03/2022    9:19 AM 03/22/2022   11:02 AM  Depression screen PHQ 2/9  Decreased Interest 0 0 1 1 1  $ Down, Depressed, Hopeless 0 0  1 1  PHQ - 2 Score 0 0 1 2 2  $ Altered sleeping 0 0 1 1 1  $ Tired, decreased energy 1 1 1  $ 0  Change in appetite 0 0 1  0  Feeling bad or failure about yourself  0 0 0 0 0  Trouble concentrating 0 0 0 1 1  Moving slowly or fidgety/restless 0 0 0 0 0  Suicidal thoughts 0 0 0 0 0  PHQ-9 Score 1 1 4 4 4  $ Difficult doing work/chores Not difficult at all Not difficult at all Not difficult at all         11/14/2022    8:57 AM 07/26/2022    9:49 AM 06/14/2022   11:10 AM 05/03/2022    9:19 AM  GAD 7 : Generalized Anxiety Score  Nervous, Anxious, on Edge 1 1 1 1  $ Control/stop worrying 0 1 1 1  $ Worry too much - different things 0 1 1   Trouble relaxing 1 1 0   Restless 0 0 1   Easily annoyed or irritable 0 0 0   Afraid - awful might happen 0 0 1   Total GAD 7 Score 2 4 5   $ Anxiety Difficulty Not difficult at all Not difficult at all Not difficult at all    Functional Status Survey: Is the patient deaf or have difficulty hearing?: No Does the patient have difficulty seeing, even when wearing glasses/contacts?: No Does the patient have difficulty concentrating, remembering, or making decisions?: No Does the patient have difficulty walking or climbing stairs?: No Does the patient have difficulty  dressing or bathing?: No Does the patient have difficulty doing errands alone such as visiting a doctor's office or shopping?: No  FALL RISK:    11/14/2022    8:56 AM 07/26/2022    9:49 AM 07/04/2022    1:27 PM 05/03/2022    9:18 AM 03/22/2022   11:02 AM  [place] in the past year? 0 1 1 0 0  Number falls in past yr: 0 0 0 0 0  Injury with Fall? 0 0 0 0 0  Risk for fall due to : No Fall Risks History of fall(s)  No Fall Risks No Fall Risks  Follow up Falls evaluation completed Falls evaluation completed Falls evaluation completed;Education provided;Falls prevention discussed Falls evaluation completed Falls evaluation completed   Advanced Directives Does patient have a HCPOA?    yes If yes, name and contact information:  Does patient have a living will or MOST form?  yes  Past Medical History:  Past Medical History:  Diagnosis Date   (HFpEF) heart failure with preserved ejection fraction ([place])    a. 01/2019 Cath: EF 55-60% w/ elevated LVEDP.   Anxiety    Barrett's esophagus    CAD (coronary artery disease)    a. 10/2018 ETT: 70m horizontal inflat ST depression, freq PVC's->Intermediate; b. 12/2018 Cardiac CTA: LM nl, LAD FFRct 0.6 in mLAD, LCX FFRct 0.99p, 0.843m0.77d, RCA no signif dzs, FFRct 0.89d->Rec cath; c. 01/2019 PCI: LM nl, LAD 40p, 8560m2 70, LCX 70p (2.75x15 Resolute Onyx DES), RCA min irregs. EF 55-65%. EDP 25-30m73m d. 01/2019 PCI: LAD 80-90 (atherectomy & 3x15 Resolute Onyx DES).   Colon polyps    Duodenal adenoma    Gastritis    GERD (gastroesophageal reflux disease)    Gout    H. pylori infection    H/O urethral stricture    Hemorrhoids    Hiatal hernia    Hypertension    Stomach ulcer     Surgical History:  Past Surgical History:  Procedure Laterality Date   CARDIAC CATHETERIZATION     CATARACT EXTRACTION W/PHACO Left 03/07/2022   Procedure: CATARACT EXTRACTION PHACO AND INTRAOCULAR LENS PLACEMENT (IOC)[place] 10.41 00:55.7;   Surgeon: PorfBirder Robson;  [facility];  Laterality: Left;   CATARACT EXTRACTION W/PHACO Right 03/28/2022   Procedure: CATARACT EXTRACTION PHACO AND INTRAOCULAR LENS PLACEMENT (IOC)[place] 6.68 00:50.0;  Surgeon: PorfBirder Robson;  [facility]hthalmology;  Laterality: Right;   COLONOSCOPY     COLONOSCOPY N/A 06/19/2022   Procedure: COLONOSCOPY;  Surgeon: RussAnnamaria Helling;  [facility];  Service: Gastroenterology;  Laterality: N/A;  COLONOSCOPY WITH PROPOFOL N/A 08/20/2018   Procedure: COLONOSCOPY WITH PROPOFOL;  Surgeon: Lollie Sails, MD;  Location: [facility] ENDOSCOPY;  Service: Endoscopy;  Laterality: N/A;   COLONOSCOPY WITH PROPOFOL N/A 12/19/2018   Procedure: COLONOSCOPY WITH PROPOFOL;  Surgeon: Lollie Sails, MD;  Location: [facility] ENDOSCOPY;  Service: Endoscopy;  Laterality: N/A;   CORONARY ANGIOPLASTY     CORONARY ATHERECTOMY N/A 02/03/2019   Procedure: CORONARY ATHERECTOMY;  Surgeon: Nelva Bush, MD;  Location: [place] CV LAB;  Service: Cardiovascular;  Laterality: N/A;   CORONARY STENT INTERVENTION N/A 01/21/2019   Procedure: CORONARY STENT INTERVENTION;  Surgeon: Nelva Bush, MD;  Location: [place] CV LAB;  Service: Cardiovascular;  Laterality: N/A;   CORONARY STENT INTERVENTION N/A 02/03/2019   Procedure: CORONARY STENT INTERVENTION;  Surgeon: Nelva Bush, MD;  Location: [place] CV LAB;  Service: Cardiovascular;  Laterality: N/A;   CYSTOSCOPY WITH URETHRAL DILATATION N/A 08/16/2018   Procedure: CYSTOSCOPY WITH URETHRAL DILATATION;  Surgeon: [name], MD;  Location: [facility] ORS;  Service: Urology;  Laterality: N/A;   ESOPHAGOGASTRODUODENOSCOPY N/A 01/29/2017   Procedure: ESOPHAGOGASTRODUODENOSCOPY (EGD);  Surgeon: Lollie Sails, MD;  Location: [facility] ENDOSCOPY;  Service: Endoscopy;  Laterality: N/A;   ESOPHAGOGASTRODUODENOSCOPY  N/A 06/19/2022   Procedure: ESOPHAGOGASTRODUODENOSCOPY (EGD);  Surgeon: Annamaria Helling, DO;  Location: [facility] ENDOSCOPY;  Service: Gastroenterology;  Laterality: N/A;   ESOPHAGOGASTRODUODENOSCOPY (EGD) WITH PROPOFOL N/A 07/10/2016   Procedure: ESOPHAGOGASTRODUODENOSCOPY (EGD) WITH PROPOFOL;  Surgeon: Lollie Sails, MD;  Location: [facility] ENDOSCOPY;  Service: Endoscopy;  Laterality: N/A;   ESOPHAGOGASTRODUODENOSCOPY (EGD) WITH PROPOFOL N/A 12/19/2018   Procedure: ESOPHAGOGASTRODUODENOSCOPY (EGD) WITH PROPOFOL;  Surgeon: Lollie Sails, MD;  Location: [facility] ENDOSCOPY;  Service: Endoscopy;  Laterality: N/A;   EYE SURGERY     gastritis     h.pylori     INTRAVASCULAR ULTRASOUND/IVUS N/A 02/03/2019   Procedure: Intravascular Ultrasound/IVUS;  Surgeon: Nelva Bush, MD;  Location: [place] CV LAB;  Service: Cardiovascular;  Laterality: N/A;   left elbow repair Left    LEFT HEART CATH AND CORONARY ANGIOGRAPHY Left 01/21/2019   Procedure: LEFT HEART CATH AND CORONARY ANGIOGRAPHY;  Surgeon: Nelva Bush, MD;  Location: [place] CV LAB;  Service: Cardiovascular;  Laterality: Left;   SPLENECTOMY  1957   TONSILLECTOMY      Medications:  Current Outpatient Medications on File Prior to Visit  Medication Sig   budesonide-formoterol (SYMBICORT) 160-4.5 MCG/ACT inhaler Inhale 2 puffs into the lungs 2 (two) times daily.   clobetasol ointment (TEMOVATE) 0.05 % Apply topically as needed.   metoprolol tartrate (LOPRESSOR) 25 MG tablet TAKE 1 TABLET BY MOUTH TWICE DAILY   nitroGLYCERIN (NITROSTAT) 0.4 MG SL tablet Place 1 tablet (0.4 mg total) under the tongue every 5 (five) minutes as needed for chest pain. Maximum of 3 doses.   OVER THE COUNTER MEDICATION AGI (athletic greens) powder form by mouth once daily   pantoprazole (PROTONIX) 20 MG tablet Take 20 mg by mouth daily.   LORazepam (ATIVAN) 1 MG tablet Take 0.5-1 tablets (0.5-1 mg total) by mouth daily as needed for anxiety. (Patient  not taking: Reported on 01/26/2023)   No current facility-administered medications on file prior to visit.    Allergies:  No Known Allergies  Social History:  Social History   Socioeconomic History   Marital status: Married    Spouse name: Not on file   Number of children: Not on file   Years of education: Not on file   Highest education level:  Master's degree (e.g., MA, MS, MEng, MEd, MSW, MBA)  Occupational History   Occupation: retired   Tobacco Use   Smoking status: Never   Smokeless tobacco: Never  Vaping Use   Vaping Use: Never used  Substance and Sexual Activity   Alcohol use: Yes    Alcohol/week: 5.0 standard drinks of alcohol    Types: 5 Standard drinks or equivalent per week    Comment: moderate-"a couple of glasses of wine each night"   Drug use: No   Sexual activity: Yes  Other Topics Concern   Not on file  Social History Narrative   Rides bicycle with friends       No smoking.  Occasional alcohol.  Lives at home.  Retired Customer service manager.   Social Determinants of Health   Financial Resource Strain: Low Risk  (07/04/2022)   Overall Financial Resource Strain (CARDIA)    Difficulty of Paying Living Expenses: Not hard at all  Food Insecurity: No Food Insecurity (07/04/2022)   Hunger Vital Sign    Worried About Running Out of Food in the Last Year: Never true    Ran Out of Food in the Last Year: Never true  Transportation Needs: No Transportation Needs (07/04/2022)   PRAPARE - Hydrologist (Medical): No    Lack of Transportation (Non-Medical): No  Physical Activity: Insufficiently Active (07/04/2022)   Exercise Vital Sign    Days of Exercise per Week: 3 days    Minutes of Exercise per Session: 30 min  Stress: No Stress Concern Present (07/04/2022)   [place]    Feeling of Stress : Not at all  Social Connections: Unknown (07/04/2022)   Social Connection and Isolation Panel  [NHANES]    Frequency of Communication with Friends and Family: Not on file    Frequency of Social Gatherings with Friends and Family: Once a week    Attends Religious Services: More than 4 times per year    Active Member of [employer] or Organizations: No    Attends Archivist Meetings: Never    Marital Status: Married  Human resources officer Violence: Not At Risk (07/04/2022)   Humiliation, Afraid, Rape, and Kick questionnaire    Fear of Current or Ex-Partner: No    Emotionally Abused: No    Physically Abused: No    Sexually Abused: No   Social History   Tobacco Use  Smoking Status Never  Smokeless Tobacco Never   Social History   Substance and Sexual Activity  Alcohol Use Yes   Alcohol/week: 5.0 standard drinks of alcohol   Types: 5 Standard drinks or equivalent per week   Comment: moderate-"a couple of glasses of wine each night"    Family History:  Family History  Adopted: Yes  Problem Relation Age of Onset   Heart attack Mother 47   Pancreatic cancer Father        died in WWII - pt adopted   Hypertension Sister    Hyperlipidemia Sister    Hypertension Brother    Hyperlipidemia Brother    Prostate cancer Neg Hx    Kidney cancer Neg Hx    Bladder Cancer Neg Hx     Past medical history, surgical history, medications, allergies, family history and social history reviewed with patient today and changes made to appropriate areas of the chart.   ROS All other ROS negative except what is listed above and in the HPI.  Objective:    BP 138/86 (BP Location: Left Arm, Patient Position: Sitting, Cuff Size: Large)   Pulse 80   Temp 97.6 F (36.4 C) (Oral)   Ht 5' 7.99" (1.727 m)   Wt 259 lb 14.4 oz (117.9 kg)   SpO2 95%   BMI 39.53 kg/m   Wt Readings from Last 3 Encounters:  01/26/23 259 lb 14.4 oz (117.9 kg)  11/30/22 256 lb (116.1 kg)  11/21/22 241 lb (109.3 kg)    No results found.  Physical Exam Vitals and nursing note reviewed.  Constitutional:       General: He is awake. He is not in acute distress.    Appearance: He is well-developed and well-groomed. He is not ill-appearing or toxic-appearing.  HENT:     Head: Normocephalic and atraumatic.     Right Ear: Hearing, tympanic membrane, ear canal and external ear normal. No drainage.     Left Ear: Hearing, tympanic membrane, ear canal and external ear normal. No drainage.     Nose: Nose normal.     Mouth/Throat:     Pharynx: Uvula midline.  Eyes:     General: Lids are normal.        Right eye: No discharge.        Left eye: No discharge.     Extraocular Movements: Extraocular movements intact.     Conjunctiva/sclera: Conjunctivae normal.     Pupils: Pupils are equal, round, and reactive to light.     Visual Fields: Right eye visual fields normal and left eye visual fields normal.  Neck:     Thyroid: No thyromegaly.     Vascular: No carotid bruit or JVD.     Trachea: Trachea normal.  Cardiovascular:     Rate and Rhythm: Normal rate and regular rhythm.     Heart sounds: Normal heart sounds, S1 normal and S2 normal. No murmur heard.    No gallop.  Pulmonary:     Effort: Pulmonary effort is normal. No accessory muscle usage or respiratory distress.     Breath sounds: Normal breath sounds.  Abdominal:     General: Bowel sounds are normal.     Palpations: Abdomen is soft. There is no hepatomegaly or splenomegaly.     Tenderness: There is no abdominal tenderness.  Musculoskeletal:        General: Normal range of motion.     Cervical back: Normal range of motion and neck supple.     Right lower leg: No edema.     Left lower leg: No edema.  Lymphadenopathy:     Head:     Right side of head: No submental, submandibular, tonsillar, preauricular or posterior auricular adenopathy.     Left side of head: No submental, submandibular, tonsillar, preauricular or posterior auricular adenopathy.     Cervical: No cervical adenopathy.  Skin:    General: Skin is warm and dry.      Capillary Refill: Capillary refill takes less than 2 seconds.     Findings: No rash.  Neurological:     Mental Status: He is alert and oriented to person, place, and time.     Gait: Gait is intact.     Deep Tendon Reflexes: Reflexes are normal and symmetric.     Reflex Scores:      Brachioradialis reflexes are 2+ on the right side and 2+ on the left side.      Patellar reflexes are 2+ on the right side and 2+ on the left  side. Psychiatric:        Attention and Perception: Attention normal.        Mood and Affect: Mood normal.        Speech: Speech normal.        Behavior: Behavior normal. Behavior is cooperative.        Thought Content: Thought content normal.        Cognition and Memory: Cognition normal.        07/04/2022    1:29 PM 06/20/2021    9:10 AM 06/11/2020    9:09 AM 08/19/2019    2:23 PM 06/04/2019    9:01 AM  6CIT Screen  What Year? 0 points 0 points 0 points 0 points 0 points  What month? 0 points 0 points 0 points 0 points 0 points  What time? 0 points 0 points 0 points 0 points 0 points  Count back from 20 0 points 0 points 0 points 0 points 0 points  Months in reverse 0 points 0 points 0 points 0 points 0 points  Repeat phrase 0 points 0 points 0 points 0 points 0 points  Total Score 0 points 0 points 0 points 0 points 0 points     Results for orders placed or performed in visit on 07/26/22  HgB A1c  Result Value Ref Range   Hgb A1c MFr Bld 5.7 (H) 4.8 - 5.6 %   Est. average glucose Bld gHb Est-mCnc 117 mg/dL  Comprehensive metabolic panel  Result Value Ref Range   Glucose 98 70 - 99 mg/dL   BUN 14 8 - 27 mg/dL   Creatinine, Ser 0.83 0.76 - 1.27 mg/dL   eGFR 90 >59 mL/min/1.73   BUN/Creatinine Ratio 17 10 - 24   Sodium 141 134 - 144 mmol/L   Potassium 3.9 3.5 - 5.2 mmol/L   Chloride 98 96 - 106 mmol/L   CO2 24 20 - 29 mmol/L   Calcium 9.9 8.6 - 10.2 mg/dL   Total Protein 7.1 6.0 - 8.5 g/dL   Albumin 4.9 (H) 3.8 - 4.8 g/dL   Globulin, Total 2.2 1.5 -  4.5 g/dL   Albumin/Globulin Ratio 2.2 1.2 - 2.2   Bilirubin Total 0.6 0.0 - 1.2 mg/dL   Alkaline Phosphatase 74 44 - 121 IU/L   AST 53 (H) 0 - 40 IU/L   ALT 43 0 - 44 IU/L  Lipid Panel w/o Chol/HDL Ratio  Result Value Ref Range   Cholesterol, Total 119 100 - 199 mg/dL   Triglycerides 59 0 - 149 mg/dL   HDL 71 >39 mg/dL   VLDL Cholesterol Cal 13 5 - 40 mg/dL   LDL Chol Calc (NIH) 35 0 - 99 mg/dL  CBC with Differential/Platelet  Result Value Ref Range   WBC 6.3 3.4 - 10.8 x10E3/uL   RBC 3.86 (L) 4.14 - 5.80 x10E6/uL   Hemoglobin 13.8 13.0 - 17.7 g/dL   Hematocrit 39.9 37.5 - 51.0 %   MCV 103 (H) 79 - 97 fL   MCH 35.8 (H) 26.6 - 33.0 pg   MCHC 34.6 31.5 - 35.7 g/dL   RDW 12.1 11.6 - 15.4 %   Platelets 285 150 - 450 x10E3/uL   Neutrophils 55 Not Estab. %   Lymphs 24 Not Estab. %   Monocytes 14 Not Estab. %   Eos 4 Not Estab. %   Basos 2 Not Estab. %   Neutrophils Absolute 3.4 1.4 - 7.0 x10E3/uL   Lymphocytes Absolute 1.5 0.7 -  3.1 x10E3/uL   Monocytes Absolute 0.9 0.1 - 0.9 x10E3/uL   EOS (ABSOLUTE) 0.3 0.0 - 0.4 x10E3/uL   Basophils Absolute 0.1 0.0 - 0.2 x10E3/uL   Immature Granulocytes 1 Not Estab. %   Immature Grans (Abs) 0.0 0.0 - 0.1 x10E3/uL  Lactate Dehydrogenase (LDH)  Result Value Ref Range   LDH 239 (H) 121 - 224 IU/L      Assessment & Plan:   Problem List Items Addressed This Visit       Cardiovascular and Mediastinum   Chronic heart failure with preserved ejection fraction (HCC) - Primary    Chronic, stable with no recent exacerbations.  Euvolemic.  Continue current medication regimen and collaboration with cardiology.  Recent notes reviewed.  Recommend: - Reminded to call for an overnight weight gain of >2 pounds or a weekly weight weight of >5 pounds - not adding salt to his food and has been reading food labels. Reviewed the importance of keeping daily sodium intake to <2079m daily  - Avoid Ibuprofen containing products      Relevant Medications    losartan (COZAAR) 100 MG tablet   furosemide (LASIX) 20 MG tablet   atorvastatin (LIPITOR) 40 MG tablet   amLODipine (NORVASC) 10 MG tablet   Other Relevant Orders   CBC with Differential/Platelet   Magnesium   TSH   Coronary artery disease of native artery of native heart with stable angina pectoris (HCC)    Chronic, stable.  No recent NTG use.  Continue current medication regimen and collaboration with cardiology.  To alert provider immediately if CP presents or go immediately to ER.      Relevant Medications   losartan (COZAAR) 100 MG tablet   furosemide (LASIX) 20 MG tablet   atorvastatin (LIPITOR) 40 MG tablet   amLODipine (NORVASC) 10 MG tablet   Essential hypertension    Chronic, ongoing with recheck at goal and home BP at goal.  Continue current medication regimen and adjust as needed, sees cardiology, recent note reviewed. Labs: CBC, CMP, TSH, urine ALB.  He declines OSA testing.  Recommend he continue to monitor BP at home and document + focus on DASH diet and regular exercise.  Return in 6 months.      Relevant Medications   losartan (COZAAR) 100 MG tablet   furosemide (LASIX) 20 MG tablet   atorvastatin (LIPITOR) 40 MG tablet   amLODipine (NORVASC) 10 MG tablet   Other Relevant Orders   CBC with Differential/Platelet   TSH   Purpura senilis (HCC)    Chronic, ongoing.  Recommend gentle skin care at home and monitor for abrasions or skin breakdown, alert provider if present.      Relevant Medications   losartan (COZAAR) 100 MG tablet   furosemide (LASIX) 20 MG tablet   atorvastatin (LIPITOR) 40 MG tablet   amLODipine (NORVASC) 10 MG tablet     Digestive   Barrett's esophagus   Relevant Orders   Magnesium     Genitourinary   BPH (benign prostatic hyperplasia)    Chronic, ongoing,  Well-controlled without medication at this time.  Continue to monitor, check PSA today.      Relevant Orders   PSA     Other   BMI 37.0-37.9, adult    Refer to morbid obesity  plan of care.      Chronic anxiety    Chronic, ongoing.  Has not used benzo in 6 months. UDS due obtained today and contract up to date. Will discontinue if  remains off benzo.      Relevant Orders   [id number] 11+Oxyco+Alc+Crt-Bund   Depression, recurrent (HCC)    Chronic, stable.  Denies SI/HI.  Continue current medication regimen and adjust as needed.  Recommend relaxation and meditation techniques at home.  Return in 6 months.      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  Continue collaboration with oncology, appreciate their ongoing input.  Recent note reviewed and labs.      Relevant Medications   allopurinol (ZYLOPRIM) 300 MG tablet   Gout    Chronic, stable.  Controlled with Allopurinol and renal dose if needed.  Continue current medication regimen.  Uric acid level today.      Relevant Orders   Uric acid   Hyperlipidemia LDL goal <70    Chronic, ongoing.  Continue current medication and adjust as needed.  Lipid panel obtained today.      Relevant Medications   losartan (COZAAR) 100 MG tablet   furosemide (LASIX) 20 MG tablet   atorvastatin (LIPITOR) 40 MG tablet   amLODipine (NORVASC) 10 MG tablet   Other Relevant Orders   Comprehensive metabolic panel   Lipid Panel w/o Chol/HDL Ratio   Long-term current use of benzodiazepine    Discussed at length with patient and recommend continue to reduce use.  Refer to anxiety plan.  UDS updated today and contract up to date. He has not taken in 6 months, weaning off.      Relevant Orders   [id number] 11+Oxyco+Alc+Crt-Bund   Morbid obesity ([place])    BMI 39.53 with underlying cardiac issues and prediabetes -- has lost weight with this.  No family history of thyroid cancer and no personal history of pancreatitis.  Educated on this medication at length.  Recommended eating smaller high protein, low fat meals  more frequently and exercising 30 mins a day 5 times a week with a goal of 10-15lb weight loss in the next 3 months. Patient voiced their understanding and motivation to adhere to these recommendations.         Relevant Medications   Semaglutide,0.25 or 0.5MG/DOS, (OZEMPIC, 0.25 OR 0.5 MG/DOSE,) 2 MG/1.5ML SOPN   Other Relevant Orders   Comprehensive metabolic panel   Prediabetes    Ongoing.  5.7% on recent labs, recheck today.  Is tolerating Ozempic well and has lost weight with this, which is beneficial for his overall cardiac health.  Continue this regimen, refill sent in.      Relevant Orders   HgB A1c   Urine Microalbumin w/creat. ratio   Other Visit Diagnoses     Flu vaccine need       Flu vaccine provided in office today.   Relevant Orders   Flu Vaccine QUAD High Dose(Fluad) (Completed)   Encounter for annual physical exam       Annual physical today with labs and health maintenance reviewed, discussed with patient.       Discussed aspirin prophylaxis for myocardial infarction prevention and decision was made to continue ASA  LABORATORY TESTING:  Health maintenance labs ordered today as discussed above.   The natural history of prostate cancer and ongoing controversy regarding screening and potential treatment outcomes of prostate cancer has been discussed with the patient. The meaning of a false positive PSA and a false negative PSA has been discussed. He indicates understanding of the limitations of this screening test and wishes to  proceed with screening PSA testing.   IMMUNIZATIONS:   - Tdap: Tetanus vaccination status reviewed: last tetanus booster within 10 years. - Influenza: Up to date - Pneumovax: Up to date - Prevnar: Up to date - Zostavax vaccine: Refused  SCREENING: - Colonoscopy: Up to date  Discussed with patient purpose of the colonoscopy is to detect colon cancer at curable precancerous or early stages   - AAA Screening: Not applicable  -Hearing  Test: Not applicable  -Spirometry: Not applicable   PATIENT COUNSELING:    Sexuality: Discussed sexually transmitted diseases, partner selection, use of condoms, avoidance of unintended pregnancy  and contraceptive alternatives.   Advised to avoid cigarette smoking.  I discussed with the patient that most people either abstain from alcohol or drink within safe limits (<=14/week and <=4 drinks/occasion for males, <=7/weeks and <= 3 drinks/occasion for females) and that the risk for alcohol disorders and other health effects rises proportionally with the number of drinks per week and how often a drinker exceeds daily limits.  Discussed cessation/primary prevention of drug use and availability of treatment for abuse.   Diet: Encouraged to adjust caloric intake to maintain  or achieve ideal body weight, to reduce intake of dietary saturated fat and total fat, to limit sodium intake by avoiding high sodium foods and not adding table salt, and to maintain adequate dietary potassium and calcium preferably from fresh fruits, vegetables, and low-fat dairy products.    Stressed the importance of regular exercise  Injury prevention: Discussed safety belts, safety helmets, smoke detector, smoking near bedding or upholstery.   Dental health: Discussed importance of regular tooth brushing, flossing, and dental visits.   Follow up plan: NEXT PREVENTATIVE PHYSICAL DUE IN 1 YEAR. Return in about 6 months (around 07/27/2023) for HTN/HLD/HF, .

## 2023-01-26 NOTE — Assessment & Plan Note (Signed)
BMI 39.53 with underlying cardiac issues and prediabetes -- has lost weight with this.  No family history of thyroid cancer and no personal history of pancreatitis.  Educated on this medication at length.  Recommended eating smaller high protein, low fat meals more frequently and exercising 30 mins a day 5 times a week with a goal of 10-15lb weight loss in the next 3 months. Patient voiced their understanding and motivation to adhere to these recommendations.

## 2023-01-26 NOTE — Assessment & Plan Note (Signed)
Chronic, ongoing.  Continue current medication and adjust as needed.  Lipid panel obtained today.

## 2023-01-26 NOTE — Assessment & Plan Note (Signed)
Ongoing.  5.7% on recent labs, recheck today.  Is tolerating Ozempic well and has lost weight with this, which is beneficial for his overall cardiac health.  Continue this regimen, refill sent in.

## 2023-01-26 NOTE — Assessment & Plan Note (Signed)
Chronic, ongoing.  Recommend gentle skin care at home and monitor for abrasions or skin breakdown, alert provider if present.

## 2023-01-26 NOTE — Assessment & Plan Note (Signed)
Chronic, ongoing,  Well-controlled without medication at this time.  Continue to monitor, check PSA today.

## 2023-01-26 NOTE — Assessment & Plan Note (Signed)
Chronic, stable.  Controlled with Allopurinol and renal dose if needed.  Continue current medication regimen.  Uric acid level today.

## 2023-01-28 ENCOUNTER — Encounter: Payer: Self-pay | Admitting: Nurse Practitioner

## 2023-01-28 LAB — CBC WITH DIFFERENTIAL/PLATELET
Basophils Absolute: 0.2 10*3/uL (ref 0.0–0.2)
Basos: 3 %
EOS (ABSOLUTE): 0.5 10*3/uL — ABNORMAL HIGH (ref 0.0–0.4)
Eos: 7 %
Hematocrit: 38.9 % (ref 37.5–51.0)
Hemoglobin: 14.3 g/dL (ref 13.0–17.7)
Immature Grans (Abs): 0 10*3/uL (ref 0.0–0.1)
Immature Granulocytes: 0 %
Lymphocytes Absolute: 2.4 10*3/uL (ref 0.7–3.1)
Lymphs: 37 %
MCH: 39.3 pg — ABNORMAL HIGH (ref 26.6–33.0)
MCHC: 36.8 g/dL — ABNORMAL HIGH (ref 31.5–35.7)
MCV: 107 fL — ABNORMAL HIGH (ref 79–97)
Monocytes Absolute: 1 10*3/uL — ABNORMAL HIGH (ref 0.1–0.9)
Monocytes: 15 %
NRBC: 1 % — ABNORMAL HIGH (ref 0–0)
Neutrophils Absolute: 2.5 10*3/uL (ref 1.4–7.0)
Neutrophils: 38 %
Platelets: 322 10*3/uL (ref 150–450)
RBC: 3.64 x10E6/uL — ABNORMAL LOW (ref 4.14–5.80)
RDW: 12 % (ref 11.6–15.4)
WBC: 6.7 10*3/uL (ref 3.4–10.8)

## 2023-01-28 LAB — DRUG SCREEN 764883 11+OXYCO+ALC+CRT-BUND
Amphetamines, Urine: NEGATIVE ng/mL
BENZODIAZ UR QL: NEGATIVE ng/mL
Barbiturate: NEGATIVE ng/mL
Cannabinoid Quant, Ur: NEGATIVE ng/mL
Cocaine (Metabolite): NEGATIVE ng/mL
Creatinine: 146.9 mg/dL (ref 20.0–300.0)
Ethanol: NEGATIVE %
Meperidine: NEGATIVE ng/mL
Methadone Screen, Urine: NEGATIVE ng/mL
OPIATE SCREEN URINE: NEGATIVE ng/mL
Oxycodone/Oxymorphone, Urine: NEGATIVE ng/mL
Phencyclidine: NEGATIVE ng/mL
Propoxyphene: NEGATIVE ng/mL
Tramadol: NEGATIVE ng/mL
pH, Urine: 7.2 (ref 4.5–8.9)

## 2023-01-28 LAB — COMPREHENSIVE METABOLIC PANEL
ALT: 57 IU/L — ABNORMAL HIGH (ref 0–44)
AST: 64 IU/L — ABNORMAL HIGH (ref 0–40)
Albumin/Globulin Ratio: 1.9 (ref 1.2–2.2)
Albumin: 4.6 g/dL (ref 3.8–4.8)
Alkaline Phosphatase: 74 IU/L (ref 44–121)
BUN/Creatinine Ratio: 12 (ref 10–24)
BUN: 11 mg/dL (ref 8–27)
Bilirubin Total: 0.6 mg/dL (ref 0.0–1.2)
CO2: 23 mmol/L (ref 20–29)
Calcium: 9.5 mg/dL (ref 8.6–10.2)
Chloride: 101 mmol/L (ref 96–106)
Creatinine, Ser: 0.9 mg/dL (ref 0.76–1.27)
Globulin, Total: 2.4 g/dL (ref 1.5–4.5)
Glucose: 100 mg/dL — ABNORMAL HIGH (ref 70–99)
Potassium: 3.9 mmol/L (ref 3.5–5.2)
Sodium: 143 mmol/L (ref 134–144)
Total Protein: 7 g/dL (ref 6.0–8.5)
eGFR: 87 mL/min/{1.73_m2} (ref >59)

## 2023-01-28 LAB — HEMOGLOBIN A1C
Est. average glucose Bld gHb Est-mCnc: 120 mg/dL
Hgb A1c MFr Bld: 5.8 % — ABNORMAL HIGH (ref 4.8–5.6)

## 2023-01-28 LAB — TSH: TSH: 1.95 u[IU]/mL (ref 0.450–4.500)

## 2023-01-28 LAB — LIPID PANEL W/O CHOL/HDL RATIO
Cholesterol, Total: 132 mg/dL (ref 100–199)
HDL: 66 mg/dL (ref >39)
LDL Chol Calc (NIH): 51 mg/dL (ref 0–99)
Triglycerides: 77 mg/dL (ref 0–149)
VLDL Cholesterol Cal: 15 mg/dL (ref 5–40)

## 2023-01-28 LAB — URIC ACID: Uric Acid: 4.1 mg/dL (ref 3.8–8.4)

## 2023-01-28 LAB — MAGNESIUM: Magnesium: 1.5 mg/dL — ABNORMAL LOW (ref 1.6–2.3)

## 2023-01-28 LAB — MICROALBUMIN / CREATININE URINE RATIO
Creatinine, Urine: 145.2 mg/dL
Microalb/Creat Ratio: 40 mg/g creat — ABNORMAL HIGH (ref 0–29)
Microalbumin, Urine: 57.6 ug/mL

## 2023-01-28 LAB — PSA: Prostate Specific Ag, Serum: 2.2 ng/mL (ref 0.0–4.0)

## 2023-01-28 NOTE — Progress Notes (Signed)
Contacted via [employer] -- did we do PA on his Ozempic? I can not recall.  Can we check on this and if declined let me know and I will try letter, although unsure we will be able to get covered.:(   Good morning Clayborne, your labs have returned: - Urine still shows mild protein in it, continue Losartan which is kidney protective. - CBC shows ongoing baseline levels for you, continue to follow with oncology. - Kidney function, creatinine and eGFR, remains normal.  Liver function has trended up though, are you taking any Tylenol or any alcohol use recently?  These can sometimes elevate levels.  We will continue to monitor and if they continue to trend up may consider imaging. - Magnesium level low.  Are you taking magnesium supplement? If not I recommend you start magnesium supplement 240 to 400 MG twice a day.  Let me know if any issues with this.  Sometimes Protonix can lower magnesium levels, but we know you need this due to Barrett's esophagus. - A1c remains in prediabetic level at 5.8%.  Continue diet and exercise focus. - Remainder of labs are all stable.  Any questions? Keep being amazing!!  Thank you for allowing me to participate in your care.  I appreciate you. Kindest regards, Anahita Cua

## 2023-01-29 ENCOUNTER — Telehealth: Payer: Self-pay | Admitting: *Deleted

## 2023-01-29 NOTE — Telephone Encounter (Signed)
Pt returning call for results, states he did view on MyChart.  Pt reports he did take tylenol a few days prior to labs and had a few drinks the night before. Also reports he did get mag.supplement and has started taking it.       Good morning Keiondre, your labs have returned: - Urine still shows mild protein in it, continue Losartan which is kidney protective. - CBC shows ongoing baseline levels for you, continue to follow with oncology. - Kidney function, creatinine and eGFR, remains normal.  Liver function has trended up though, are you taking any Tylenol or any alcohol use recently?  These can sometimes elevate levels.  We will continue to monitor and if they continue to trend up may consider imaging. - Magnesium level low.  Are you taking magnesium supplement? If not I recommend you start magnesium supplement 240 to 400 MG twice a day.  Let me know if any issues with this.  Sometimes Protonix can lower magnesium levels, but we know you need this due to Barrett's esophagus. - A1c remains in prediabetic level at 5.8%.  Continue diet and exercise focus. - Remainder of labs are all stable.  Any questions? Keep being amazing!!  Thank you for allowing me to participate in your care.  I appreciate you. Kindest regards, Jolene

## 2023-03-14 ENCOUNTER — Other Ambulatory Visit: Payer: Self-pay | Admitting: Nurse Practitioner

## 2023-03-14 NOTE — Telephone Encounter (Signed)
Requested medication (s) are due for refill today: Yes  Requested medication (s) are on the active medication list: Yes  Last refill:  11/30/22  Future visit scheduled: Yes  Notes to clinic:  Unable to refill per protocol, last refill by another provider.      Requested Prescriptions  Pending Prescriptions Disp Refills   pantoprazole (PROTONIX) 20 MG tablet [Pharmacy Med Name: PANTOPRAZOLE 20MG  TABLETS] 180 tablet     Sig: TAKE 1 TABLET(20 MG) BY MOUTH TWICE DAILY     Gastroenterology: Proton Pump Inhibitors Passed - 03/14/2023  8:09 AM      Passed - Valid encounter within last 12 months    Recent Outpatient Visits           1 month ago Chronic heart failure with preserved ejection fraction ([place])   [place], [place] T, NP   3 months ago Acute cough   [place] Kathrine Haddock, NP   4 months ago Acute cough   [place] Kathrine Haddock, NP   7 months ago Depression, recurrent ([place])   [place], [place] T, NP   9 months ago Morbid obesity ([place])   [place], Henrine Screws T, NP       Future Appointments             In 2 months [place], Harrell Gave, MD [place] at [place]   In 4 months [place], Barbaraann Faster, NP [place], [facility]            Refused Prescriptions Disp Refills   sertraline (ZOLOFT) 50 MG tablet [Pharmacy Med Name: SERTRALINE 50MG  TABLETS] 90 tablet 4    Sig: TAKE 1 TABLET(50 MG) BY MOUTH DAILY     Psychiatry:  Antidepressants - SSRI - sertraline Failed - 03/14/2023  8:09 AM      Failed - AST in normal range and within 360 days    AST  Date Value Ref Range Status  01/26/2023 64 (H) 0 - 40 IU/L Final   AST (SGOT) Piccolo, Waived  Date Value Ref Range Status  02/12/2019 38 11 - 38 U/L Final         Failed - ALT in normal range and within 360 days    ALT  Date Value Ref  Range Status  01/26/2023 57 (H) 0 - 44 IU/L Final   ALT (SGPT) Piccolo, Waived  Date Value Ref Range Status  02/12/2019 30 10 - 47 U/L Final         Passed - Completed PHQ-2 or PHQ-9 in the last 360 days      Passed - Valid encounter within last 6 months    Recent Outpatient Visits           1 month ago Chronic heart failure with preserved ejection fraction ([place])   [place], Henrine Screws T, NP   3 months ago Acute cough   [place], NP   4 months ago Acute cough   [place] Kathrine Haddock, NP   7 months ago Depression, recurrent [facility])   [place], Henrine Screws T, NP   9 months ago Morbid obesity [facility])   [place] Venita Lick, NP       Future Appointments  In 2 months End, Harrell Gave, MD [place] at [place]   In 4 months [place], Barbaraann Faster, NP [place], [facility]

## 2023-03-14 NOTE — Telephone Encounter (Signed)
Discontinued on 07/21/22, will refuse Sertraline due to this.   Requested Prescriptions  Pending Prescriptions Disp Refills   sertraline (ZOLOFT) 50 MG tablet [Pharmacy Med Name: SERTRALINE 50MG  TABLETS] 90 tablet 4    Sig: TAKE 1 TABLET(50 MG) BY MOUTH DAILY     Psychiatry:  Antidepressants - SSRI - sertraline Failed - 03/14/2023  8:09 AM      Failed - AST in normal range and within 360 days    AST  Date Value Ref Range Status  01/26/2023 64 (H) 0 - 40 IU/L Final   AST (SGOT) Piccolo, Waived  Date Value Ref Range Status  02/12/2019 38 11 - 38 U/L Final         Failed - ALT in normal range and within 360 days    ALT  Date Value Ref Range Status  01/26/2023 57 (H) 0 - 44 IU/L Final   ALT (SGPT) Piccolo, Waived  Date Value Ref Range Status  02/12/2019 30 10 - 47 U/L Final         Passed - Completed PHQ-2 or PHQ-9 in the last 360 days      Passed - Valid encounter within last 6 months    Recent Outpatient Visits           1 month ago Chronic heart failure with preserved ejection fraction ([name])   [name], [place] T, NP   3 months ago Acute cough   [name] Kathrine Haddock, NP   4 months ago Acute cough   [name] Kathrine Haddock, NP   7 months ago Depression, recurrent [name]   [place], Henrine Screws T, NP   9 months ago Morbid obesity ([name])   [place], Barbaraann Faster, NP       Future Appointments             In 2 months End, Harrell Gave, MD [place] at [place]   In 4 months [place], Barbaraann Faster, NP [place], [facility]             pantoprazole ([place]) 20 MG tablet [Pharmacy Med Name: PANTOPRAZOLE 20MG  TABLETS] 180 tablet     Sig: TAKE 1 TABLET(20 MG) BY MOUTH TWICE DAILY     Gastroenterology: Proton Pump Inhibitors Passed - 03/14/2023  8:09 AM      Passed - Valid encounter  within last 12 months    Recent Outpatient Visits           1 month ago Chronic heart failure with preserved ejection fraction ([name])   [name], Henrine Screws T, NP   3 months ago Acute cough   [name], NP   4 months ago Acute cough   [name] Kathrine Haddock, NP   7 months ago Depression, recurrent [name]   [place], Henrine Screws T, NP   9 months ago Morbid obesity ([name])   [place], Barbaraann Faster, NP       Future Appointments             In 2 months [place], Harrell Gave, MD [place] at [place]   In 4 months [place], Barbaraann Faster, NP [place], [facility]

## 2023-03-27 DIAGNOSIS — B351 Tinea unguium: Secondary | ICD-10-CM | POA: Diagnosis not present

## 2023-03-27 DIAGNOSIS — L820 Inflamed seborrheic keratosis: Secondary | ICD-10-CM | POA: Diagnosis not present

## 2023-03-27 DIAGNOSIS — B078 Other viral warts: Secondary | ICD-10-CM | POA: Diagnosis not present

## 2023-03-27 DIAGNOSIS — D225 Melanocytic nevi of trunk: Secondary | ICD-10-CM | POA: Diagnosis not present

## 2023-03-27 DIAGNOSIS — L309 Dermatitis, unspecified: Secondary | ICD-10-CM | POA: Diagnosis not present

## 2023-03-27 DIAGNOSIS — D2261 Melanocytic nevi of right upper limb, including shoulder: Secondary | ICD-10-CM | POA: Diagnosis not present

## 2023-03-27 DIAGNOSIS — D2262 Melanocytic nevi of left upper limb, including shoulder: Secondary | ICD-10-CM | POA: Diagnosis not present

## 2023-03-27 DIAGNOSIS — D485 Neoplasm of uncertain behavior of skin: Secondary | ICD-10-CM | POA: Diagnosis not present

## 2023-06-01 NOTE — Progress Notes (Unsigned)
Follow-up Outpatient Visit Date: 06/04/2023  Primary Care Provider: Marjie Skiff, NP [address] [place] [address]  Chief Complaint: Follow-up coronary artery disease,  HPI:  [name] is a 77 y.o. male with history of coronary artery disease status post PCI to LCx and LAD, HFpEF, hypertension, Barrett's esophagus, MALT-lymphoma of colon, gout, BPH, and obesity, who presents for follow-up of coronary artery disease and HFpEF.  I last saw him in 11/2022, at which time he was feeling well, having recovered from bronchitis in the fall.  He was taking semaglutide and was continuing to lose weight.  We did not make any medication changes or pursue additional testing.  Today, Jesse Norton reports that he has been feeling fairly well, though he notes that at times his wife feels like Jesse Norton appears out of breath.  He denies frank dyspnea but feels like he sometimes needs to take deep breaths after doing strenuous activities.  He continues to ride a stationary bike and walk on a treadmill without limitations.  He denies chest pain, palpitations, lightheadedness, and edema.  He is somewhat frustrated by persistent hypertension with home blood pressures that typically range 135-145/75-85.  He is trying to minimize his sodium intake and begins to double down on his weight loss efforts together with his wife.  --------------------------------------------------------------------------------------------------  Cardiovascular History & Procedures: Cardiovascular Problems: Coronary artery disease HFpEF   Risk Factors: Known CAD, hypertension, male gender, obesity, and age greater than 21   Cath/PCI: PCI (02/03/2019): Severe, heavily calcified mid LAD stenosis (80-90%), unchanged from diagnostic catheterization earlier this month. Widely patent LCx stent. Normal left ventricular filling pressure. Successful orbital atherectomy and IVUS-guided PCI to the mid LAD using a Resolute Onyx 3.0 x 15  mm drug-eluting stent (postdilated to 3.6 mm) with 0% residual stenosis and TIMI-3 flow.   LHC/PCI (01/21/2019): Significant 2-vessel coronary artery disease, including 80-90% mid LAD stenosis with heavy calcification, as well as 70% proximal LCx stenosis. Normal left ventricular systolic function. Moderately elevated left ventricular filling pressure. Successful PCI to proximal LCx using Resolute Onyx 2.75 x 15 mm drug-eluting stent (postdilated to 3.1 mm proximally) with 0% residual stenosis and TIMI-3 flow.   CV Surgery: None   EP Procedures and Devices: None   Non-Invasive Evaluation(s): Cardiac CTA (12/23/2018): Multivessel CAD with hemodynamically significant disease involving the mid LAD and distal LCx. Exercise tolerance test (11/06/2018): Intermediate risk study with 1 mm horizontal ST depressions and frequent PVCs during stress.  Recent CV Pertinent Labs: Lab Results  Component Value Date   CHOL 132 01/26/2023   CHOL 124 02/12/2019   HDL 66 01/26/2023   LDLCALC 51 01/26/2023   TRIG 77 01/26/2023   TRIG 58 02/12/2019   CHOLHDL 1.9 08/25/2020   K 3.9 01/26/2023   K 4.0 01/06/2014   MG 1.5 (L) 01/26/2023   BUN 11 01/26/2023   BUN 22 (H) 01/06/2014   CREATININE 0.90 01/26/2023   CREATININE 1.13 01/06/2014    Past medical and surgical history were reviewed and updated in EPIC.  Current Meds  Medication Sig   allopurinol (ZYLOPRIM) 300 MG tablet Take 1 tablet (300 mg total) by mouth 3 (three) times a week.   amLODipine (NORVASC) 10 MG tablet TAKE 1 TABLET(10 MG) BY MOUTH DAILY   atorvastatin (LIPITOR) 40 MG tablet TAKE 1 TABLET(40 MG) BY MOUTH DAILY   budesonide-formoterol (SYMBICORT) 160-4.5 MCG/ACT inhaler Inhale 2 puffs into the lungs 2 (two) times daily.   clobetasol ointment (TEMOVATE) 0.05 %  Apply topically as needed.   clopidogrel (PLAVIX) 75 MG tablet TAKE 1 TABLET(75 MG) BY MOUTH DAILY WITH BREAKFAST   furosemide (LASIX) 20 MG tablet Take 1 tablet (20 mg  total) by mouth daily.   losartan (COZAAR) 100 MG tablet TAKE 1 TABLET(100 MG) BY MOUTH DAILY   metoprolol tartrate (LOPRESSOR) 25 MG tablet TAKE 1 TABLET BY MOUTH TWICE DAILY   nitroGLYCERIN (NITROSTAT) 0.4 MG SL tablet Place 1 tablet (0.4 mg total) under the tongue every 5 (five) minutes as needed for chest pain. Maximum of 3 doses.   OVER THE COUNTER MEDICATION AGI (athletic greens) powder form by mouth once daily   pantoprazole (PROTONIX) 20 MG tablet TAKE 1 TABLET(20 MG) BY MOUTH TWICE DAILY    Allergies: Patient has no known allergies.  Social History   Tobacco Use   Smoking status: Never   Smokeless tobacco: Never  Vaping Use   Vaping Use: Never used  Substance Use Topics   Alcohol use: Yes    Alcohol/week: 5.0 standard drinks of alcohol    Types: 5 Standard drinks or equivalent per week    Comment: moderate-"a couple of glasses of wine each night"   Drug use: No    Family History  Adopted: Yes  Problem Relation Age of Onset   Heart attack Mother 44   Pancreatic cancer Father        died in WWII - pt adopted   Hypertension Sister    Hyperlipidemia Sister    Hypertension Brother    Hyperlipidemia Brother    Prostate cancer Neg Hx    Kidney cancer Neg Hx    Bladder Cancer Neg Hx     Review of Systems: A 12-system review of systems was performed and was negative except as noted in the HPI.  --------------------------------------------------------------------------------------------------  Physical Exam: BP (!) 140/80 (BP Location: Left Arm, Patient Position: Sitting, Cuff Size: Large)   Pulse 75   Ht 5\' 11"  (1.803 m)   Wt 263 lb 3.2 oz (119.4 kg)   SpO2 94%   BMI 36.71 kg/m   General:  NAD. Neck: No JVD or HJR. Lungs: Clear to auscultation bilaterally without wheezes or crackles. Heart: Regular rate and rhythm without murmurs, rubs, or gallops. Abdomen: Soft, nontender, nondistended. Extremities: Trace ankle edema bilaterally.  EKG: Normal sinus rhythm  with nonspecific T wave abnormality.  No significant change from prior tracing on 11/30/2022.  Lab Results  Component Value Date   WBC 6.7 01/26/2023   HGB 14.3 01/26/2023   HCT 38.9 01/26/2023   MCV 107 (H) 01/26/2023   PLT 322 01/26/2023    Lab Results  Component Value Date   NA 143 01/26/2023   K 3.9 01/26/2023   CL 101 01/26/2023   CO2 23 01/26/2023   BUN 11 01/26/2023   CREATININE 0.90 01/26/2023   GLUCOSE 100 (H) 01/26/2023   ALT 57 (H) 01/26/2023    Lab Results  Component Value Date   CHOL 132 01/26/2023   HDL 66 01/26/2023   LDLCALC 51 01/26/2023   TRIG 77 01/26/2023   CHOLHDL 1.9 08/25/2020   --------------------------------------------------------------------------------------------------  ASSESSMENT AND PLAN: Coronary artery disease with stable angina: Jesse Norton continues to do well without any chest pain on his current antianginal and blood pressure regimen consisting of amlodipine and metoprolol.  He has some questionable mild dyspnea with strenuous activities, which is primarily noticed by his wife.  EKG shows stable nonspecific T wave changes again today.  We have agreed to obtain  an echocardiogram to ensure that he does not have a cardiomyopathy in the setting of his significant CAD in the past.  Continue current medications for secondary prevention including clopidogrel and atorvastatin.  Chronic HFpEF and dyspnea on exertion: Jesse Norton denies frank dyspnea though his wife has mentioned to him on several occasions that it seems like he is a bit out of breath.  He has trace pretibial edema on exam today.  Will continue low-dose furosemide as well as blood pressure control as detailed below.  We have agreed to obtain an echocardiogram to ensure to ensure that his EF has not dropped or that he has developed significant valvular abnormalities contributing to this.  Hypertension: Blood pressure mildly elevated today and often at home.  We discussed escalation of  his blood pressure regimen but have agreed to focus on lifestyle modifications first.  If blood pressure remains elevated, 1 could consider transitioning metoprolol to carvedilol or adding spironolactone for better blood pressure control.  Hyperlipidemia: Lipids well-controlled on last check in February.  Mildly elevated ALT noted at that time.  Continue atorvastatin 40 mg daily.  Follow-up: Return to clinic in 6 months, sooner if echo shows significant abnormality or new symptoms develop.  Yvonne Kendall, MD 06/04/2023 10:31 AM

## 2023-06-04 ENCOUNTER — Ambulatory Visit: Payer: Medicare Other | Attending: Internal Medicine | Admitting: Internal Medicine

## 2023-06-04 ENCOUNTER — Encounter: Payer: Self-pay | Admitting: Internal Medicine

## 2023-06-04 VITALS — BP 136/78 | HR 75 | Ht 71.0 in | Wt 263.2 lb

## 2023-06-04 DIAGNOSIS — R0609 Other forms of dyspnea: Secondary | ICD-10-CM | POA: Diagnosis not present

## 2023-06-04 DIAGNOSIS — E785 Hyperlipidemia, unspecified: Secondary | ICD-10-CM | POA: Diagnosis not present

## 2023-06-04 DIAGNOSIS — I10 Essential (primary) hypertension: Secondary | ICD-10-CM | POA: Insufficient documentation

## 2023-06-04 DIAGNOSIS — I5032 Chronic diastolic (congestive) heart failure: Secondary | ICD-10-CM | POA: Diagnosis not present

## 2023-06-04 DIAGNOSIS — I25118 Atherosclerotic heart disease of native coronary artery with other forms of angina pectoris: Secondary | ICD-10-CM | POA: Diagnosis not present

## 2023-06-04 DIAGNOSIS — R0602 Shortness of breath: Secondary | ICD-10-CM | POA: Insufficient documentation

## 2023-06-04 NOTE — Patient Instructions (Signed)
Medication Instructions:  Your physician recommends that you continue on your current medications as directed. Please refer to the Current Medication list given to you today.   *If you need a refill on your cardiac medications before your next appointment, please call your pharmacy*   Lab Work: None ordered today   Testing/Procedures: Your physician has requested that you have an echocardiogram. Echocardiography is a painless test that uses sound waves to create images of your heart. It provides your doctor with information about the size and shape of your heart and how well your heart's chambers and valves are working.   You may receive an ultrasound enhancing agent through an IV if needed to better visualize your heart during the echo. This procedure takes approximately one hour.  There are no restrictions for this procedure.  This will take place at [address] [address], [place] [address]    Follow-Up: At [facility], you and your health needs are our priority.  As part of our continuing mission to provide you with exceptional heart care, we have created designated Provider Care Teams.  These Care Teams include your primary Cardiologist (physician) and Advanced Practice Providers (APPs -  Physician Assistants and Nurse Practitioners) who all work together to provide you with the care you need, when you need it.  We recommend signing up for the patient portal called "MyChart".  Sign up information is provided on this After Visit Summary.  MyChart is used to connect with patients for Virtual Visits (Telemedicine).  Patients are able to view lab/test results, encounter notes, upcoming appointments, etc.  Non-urgent messages can be sent to your provider as well.   To learn more about what you can do with MyChart, go to [email or web address].    Your next appointment:   6 month(s)  Provider:   You may see Yvonne Kendall, MD or one of the following  Advanced Practice Providers on your designated Care Team:   Nicolasa Ducking, NP Eula Listen, PA-C Cadence Fransico Michael, PA-C Charlsie Quest, NP

## 2023-06-12 ENCOUNTER — Telehealth: Payer: Self-pay

## 2023-06-12 NOTE — Telephone Encounter (Signed)
PA for Ozempic initiated and submitted via Cover My Meds. Key: [id number]

## 2023-06-19 NOTE — Telephone Encounter (Signed)
PA approved.

## 2023-07-04 ENCOUNTER — Other Ambulatory Visit: Payer: Self-pay | Admitting: Internal Medicine

## 2023-07-04 DIAGNOSIS — R0609 Other forms of dyspnea: Secondary | ICD-10-CM

## 2023-07-04 DIAGNOSIS — I10 Essential (primary) hypertension: Secondary | ICD-10-CM

## 2023-07-04 DIAGNOSIS — E785 Hyperlipidemia, unspecified: Secondary | ICD-10-CM

## 2023-07-04 DIAGNOSIS — I5032 Chronic diastolic (congestive) heart failure: Secondary | ICD-10-CM

## 2023-07-04 DIAGNOSIS — I25118 Atherosclerotic heart disease of native coronary artery with other forms of angina pectoris: Secondary | ICD-10-CM

## 2023-07-05 ENCOUNTER — Telehealth: Payer: Self-pay | Admitting: Nurse Practitioner

## 2023-07-05 NOTE — Telephone Encounter (Signed)
Copied from CRM [id number]. Topic: Medicare AWV >> Jul 05, 2023  2:19 PM Payton Doughty wrote: Reason for CRM: LM 07/05/2023 to schedule AWV   Verlee Rossetti; Care Guide Ambulatory Clinical Support [place] [facility] Direct Dial: [phone]

## 2023-07-16 ENCOUNTER — Ambulatory Visit: Payer: Medicare Other | Attending: Internal Medicine

## 2023-07-16 DIAGNOSIS — I5032 Chronic diastolic (congestive) heart failure: Secondary | ICD-10-CM | POA: Diagnosis not present

## 2023-07-16 DIAGNOSIS — R0609 Other forms of dyspnea: Secondary | ICD-10-CM | POA: Diagnosis not present

## 2023-07-16 DIAGNOSIS — I77810 Thoracic aortic ectasia: Secondary | ICD-10-CM

## 2023-07-16 HISTORY — DX: Thoracic aortic ectasia: I77.810

## 2023-07-16 LAB — ECHOCARDIOGRAM COMPLETE: Area-P 1/2: 3.65 cm2

## 2023-07-16 MED ORDER — PERFLUTREN LIPID MICROSPHERE
1.0000 mL | INTRAVENOUS | Status: AC | PRN
Start: 2023-07-16 — End: 2023-07-16
  Administered 2023-07-16: 3 mL via INTRAVENOUS

## 2023-07-17 ENCOUNTER — Ambulatory Visit (INDEPENDENT_AMBULATORY_CARE_PROVIDER_SITE_OTHER): Payer: Medicare Other | Admitting: Emergency Medicine

## 2023-07-17 DIAGNOSIS — R7303 Prediabetes: Secondary | ICD-10-CM

## 2023-07-17 DIAGNOSIS — Z0000 Encounter for general adult medical examination without abnormal findings: Secondary | ICD-10-CM

## 2023-07-17 DIAGNOSIS — Z6830 Body mass index (BMI) 30.0-30.9, adult: Secondary | ICD-10-CM

## 2023-07-17 NOTE — Progress Notes (Signed)
Subjective:   Jesse Norton is a 77 y.o. male who presents for Medicare Annual/Subsequent preventive examination.  Visit Complete: Virtual  I connected with  Jesse Norton on 07/17/23 by a audio enabled telemedicine application and verified that I am speaking with the correct person using two identifiers.  Patient Location: Home  Provider Location: Home Office  I discussed the limitations of evaluation and management by telemedicine. The patient expressed understanding and agreed to proceed.  Vital Signs: Unable to obtain new vitals due to this being a telehealth visit.   Review of Systems     Cardiac Risk Factors include: advanced age (>85men, >31 women);male gender;dyslipidemia;hypertension;Other (see comment);obesity (BMI >30kg/m2), Risk factor comments: pre-diabetes     Objective:    Today's Vitals   07/17/23 1113  Weight: 245 lb (111.1 kg)  Height: 5\' 11"  (1.803 m)   Body mass index is 34.17 kg/m.     07/17/2023   11:30 AM 07/21/2022    9:27 AM 07/04/2022    1:27 PM 06/19/2022    8:01 AM 03/28/2022    9:02 AM 03/07/2022    7:35 AM 06/20/2021    9:04 AM  Advanced Directives  Does Patient Have a Medical Advance Directive? Yes Yes Yes Yes Yes Yes Yes  Type of Estate agent of [place];Living will Healthcare Power of [place];Living will Healthcare Power of [employer] of [place];Living will Healthcare Power of [place];Living will Healthcare Power of [place];Living will Healthcare Power of [place];Living will  Does patient want to make changes to medical advance directive? No - Patient declined    No - Patient declined No - Patient declined   Copy of Healthcare Power of Attorney in Chart? No - copy requested  No - copy requested  No - copy requested Yes - validated most recent copy scanned in chart (See row information) No - copy requested    Current Medications (verified) Outpatient Encounter Medications as of 07/17/2023   Medication Sig   allopurinol (ZYLOPRIM) 300 MG tablet Take 1 tablet (300 mg total) by mouth 3 (three) times a week.   amLODipine (NORVASC) 10 MG tablet TAKE 1 TABLET(10 MG) BY MOUTH DAILY   atorvastatin (LIPITOR) 40 MG tablet TAKE 1 TABLET(40 MG) BY MOUTH DAILY   clobetasol ointment (TEMOVATE) 0.05 % Apply topically as needed.   clopidogrel (PLAVIX) 75 MG tablet TAKE 1 TABLET(75 MG) BY MOUTH DAILY WITH BREAKFAST   furosemide (LASIX) 20 MG tablet Take 1 tablet (20 mg total) by mouth daily.   losartan (COZAAR) 100 MG tablet TAKE 1 TABLET(100 MG) BY MOUTH DAILY   metoprolol tartrate (LOPRESSOR) 25 MG tablet TAKE 1 TABLET BY MOUTH TWICE DAILY   nitroGLYCERIN (NITROSTAT) 0.4 MG SL tablet Place 1 tablet (0.4 mg total) under the tongue every 5 (five) minutes as needed for chest pain. Maximum of 3 doses.   OVER THE COUNTER MEDICATION AGI (athletic greens) powder form by mouth once daily   pantoprazole (PROTONIX) 20 MG tablet TAKE 1 TABLET(20 MG) BY MOUTH TWICE DAILY   Semaglutide,0.25 or 0.5MG /DOS, (OZEMPIC, 0.25 OR 0.5 MG/DOSE,) 2 MG/1.5ML SOPN Inject 0.5 MG in the skin weekly for prediabetes.   budesonide-formoterol (SYMBICORT) 160-4.5 MCG/ACT inhaler Inhale 2 puffs into the lungs 2 (two) times daily. (Patient not taking: Reported on 07/17/2023)   LORazepam (ATIVAN) 1 MG tablet Take 0.5-1 tablets (0.5-1 mg total) by mouth daily as needed for anxiety. (Patient not taking: Reported on 01/26/2023)   No facility-administered encounter medications on file as  of 07/17/2023.    Allergies (verified) Patient has no known allergies.   History: Past Medical History:  Diagnosis Date   (HFpEF) heart failure with preserved ejection fraction (HCC)    a. 01/2019 Cath: EF 55-60% w/ elevated LVEDP.   Anxiety    Barrett's esophagus    CAD (coronary artery disease)    a. 10/2018 ETT: 1mm horizontal inflat ST depression, freq PVC's->Intermediate; b. 12/2018 Cardiac CTA: LM nl, LAD FFRct 0.6 in mLAD, LCX FFRct 0.99p,  0.80m, 0.77d, RCA no signif dzs, FFRct 0.89d->Rec cath; c. 01/2019 PCI: LM nl, LAD 40p, 53m, D2 70, LCX 70p (2.75x15 Resolute Onyx DES), RCA min irregs. EF 55-65%. EDP 25-47mmHg; d. 01/2019 PCI: LAD 80-90 (atherectomy & 3x15 Resolute Onyx DES).   Colon polyps    Duodenal adenoma    Gastritis    GERD (gastroesophageal reflux disease)    Gout    H. pylori infection    H/O urethral stricture    Hemorrhoids    Hiatal hernia    Hypertension    Stomach ulcer    Past Surgical History:  Procedure Laterality Date   CARDIAC CATHETERIZATION     CATARACT EXTRACTION W/PHACO Left 03/07/2022   Procedure: CATARACT EXTRACTION PHACO AND INTRAOCULAR LENS PLACEMENT (IOC) LEFT VIVITY TORIC LENS 10.41 00:55.7;  Surgeon: Galen Manila, MD;  Location: [facility];  Service: Ophthalmology;  Laterality: Left;   CATARACT EXTRACTION W/PHACO Right 03/28/2022   Procedure: CATARACT EXTRACTION PHACO AND INTRAOCULAR LENS PLACEMENT (IOC) RIGHT VIVITY LENS 6.68 00:50.0;  Surgeon: Galen Manila, MD;  Location: [facility];  Service: Ophthalmology;  Laterality: Right;   COLONOSCOPY     COLONOSCOPY N/A 06/19/2022   Procedure: COLONOSCOPY;  Surgeon: Jaynie Collins, DO;  Location: [facility] ENDOSCOPY;  Service: Gastroenterology;  Laterality: N/A;   COLONOSCOPY WITH PROPOFOL N/A 08/20/2018   Procedure: COLONOSCOPY WITH PROPOFOL;  Surgeon: Christena Deem, MD;  Location: [facility] ENDOSCOPY;  Service: Endoscopy;  Laterality: N/A;   COLONOSCOPY WITH PROPOFOL N/A 12/19/2018   Procedure: COLONOSCOPY WITH PROPOFOL;  Surgeon: Christena Deem, MD;  Location: [facility] ENDOSCOPY;  Service: Endoscopy;  Laterality: N/A;   CORONARY ANGIOPLASTY     CORONARY ATHERECTOMY N/A 02/03/2019   Procedure: CORONARY ATHERECTOMY;  Surgeon: Yvonne Kendall, MD;  Location: [facility] INVASIVE CV LAB;  Service: Cardiovascular;  Laterality: N/A;   CORONARY STENT INTERVENTION N/A 01/21/2019   Procedure: CORONARY STENT INTERVENTION;  Surgeon:  Yvonne Kendall, MD;  Location: [facility] INVASIVE CV LAB;  Service: Cardiovascular;  Laterality: N/A;   CORONARY STENT INTERVENTION N/A 02/03/2019   Procedure: CORONARY STENT INTERVENTION;  Surgeon: Yvonne Kendall, MD;  Location: [facility] INVASIVE CV LAB;  Service: Cardiovascular;  Laterality: N/A;   CORONARY ULTRASOUND/IVUS N/A 02/03/2019   Procedure: Intravascular Ultrasound/IVUS;  Surgeon: Yvonne Kendall, MD;  Location: [facility]VASIVE CV LAB;  Service: Cardiovascular;  Laterality: N/A;   CYSTOSCOPY WITH URETHRAL DILATATION N/A 08/16/2018   Procedure: CYSTOSCOPY WITH URETHRAL DILATATION;  Surgeon: Sondra Come, MD;  Location: [facility] ORS;  Service: Urology;  Laterality: N/A;   ESOPHAGOGASTRODUODENOSCOPY N/A 01/29/2017   Procedure: ESOPHAGOGASTRODUODENOSCOPY (EGD);  Surgeon: Christena Deem, MD;  Location: [facility] ENDOSCOPY;  Service: Endoscopy;  Laterality: N/A;   ESOPHAGOGASTRODUODENOSCOPY N/A 06/19/2022   Procedure: ESOPHAGOGASTRODUODENOSCOPY (EGD);  Surgeon: Jaynie Collins, DO;  Location: [facility] ENDOSCOPY;  Service: Gastroenterology;  Laterality: N/A;   ESOPHAGOGASTRODUODENOSCOPY (EGD) WITH PROPOFOL N/A 07/10/2016   Procedure: ESOPHAGOGASTRODUODENOSCOPY (EGD) WITH PROPOFOL;  Surgeon: Christena Deem, MD;  Location: [facility] ENDOSCOPY;  Service: Endoscopy;  Laterality: N/A;   ESOPHAGOGASTRODUODENOSCOPY (EGD) WITH PROPOFOL N/A 12/19/2018   Procedure: ESOPHAGOGASTRODUODENOSCOPY (EGD) WITH PROPOFOL;  Surgeon: Christena Deem, MD;  Location: [facility] ENDOSCOPY;  Service: Endoscopy;  Laterality: N/A;   EYE SURGERY     gastritis     h.pylori     left elbow repair Left    LEFT HEART CATH AND CORONARY ANGIOGRAPHY Left 01/21/2019   Procedure: LEFT HEART CATH AND CORONARY ANGIOGRAPHY;  Surgeon: Yvonne Kendall, MD;  Location: [facility] INVASIVE CV LAB;  Service: Cardiovascular;  Laterality: Left;   SPLENECTOMY  1957   TONSILLECTOMY     Family History  Adopted: Yes  Problem Relation Age of Onset   Heart  attack Mother 25   Pancreatic cancer Father        died in WWII - pt adopted   Hypertension Sister    Hyperlipidemia Sister    Hypertension Brother    Hyperlipidemia Brother    Prostate cancer Neg Hx    Kidney cancer Neg Hx    Bladder Cancer Neg Hx    Social History   Socioeconomic History   Marital status: Married    Spouse name: Marylu Lund   Number of children: 2   Years of education: Not on file   Highest education level: Master's degree (e.g., MA, MS, MEng, MEd, MSW, MBA)  Occupational History   Occupation: retired     Comment: Psychologist, occupational  Tobacco Use   Smoking status: Never   Smokeless tobacco: Never  Vaping Use   Vaping status: Never Used  Substance and Sexual Activity   Alcohol use: Not Currently    Alcohol/week: 14.0 standard drinks of alcohol    Types: 14 Glasses of wine per week    Comment: moderate-"a couple of glasses of wine each night"   Drug use: No   Sexual activity: Yes  Other Topics Concern   Not on file  Social History Narrative   Married, 2 sons, 1 in [place], [place] and 1 in [employer], stationary bike      No smoking.  Occasional alcohol.  Lives at home.  Retired Psychologist, occupational.   Social Determinants of Health   Financial Resource Strain: Low Risk  (07/17/2023)   Overall Financial Resource Strain (CARDIA)    Difficulty of Paying Living Expenses: Not hard at all  Food Insecurity: No Food Insecurity (07/17/2023)   Hunger Vital Sign    Worried About Running Out of Food in the Last Year: Never true    Ran Out of Food in the Last Year: Never true  Transportation Needs: No Transportation Needs (07/17/2023)   PRAPARE - Administrator, Civil Service (Medical): No    Lack of Transportation (Non-Medical): No  Physical Activity: Insufficiently Active (07/17/2023)   Exercise Vital Sign    Days of Exercise per Week: 4 days    Minutes of Exercise per Session: 30 min  Stress: No Stress Concern Present (07/17/2023)   [employer] of Occupational Health -  Occupational Stress Questionnaire    Feeling of Stress : Only a little  Social Connections: Moderately Integrated (07/17/2023)   Social Connection and Isolation Panel [NHANES]    Frequency of Communication with Friends and Family: Three times a week    Frequency of Social Gatherings with Friends and Family: Three times a week    Attends Religious Services: More than 4 times per year    Active Member of Clubs or Organizations: No    Attends Banker Meetings:  Never    Marital Status: Married    Tobacco Counseling Counseling given: Not Answered   Clinical Intake:  Pre-visit preparation completed: Yes  Pain : No/denies pain     BMI - recorded: 34.17 Nutritional Status: BMI > 30  Obese Nutritional Risks: None Diabetes: No (pre-diabetic)  How often do you need to have someone help you when you read instructions, pamphlets, or other written materials from your doctor or pharmacy?: 1 - Never  Interpreter Needed?: No  Information entered by :: Tora Kindred, CMA   Activities of Daily Living    07/17/2023   11:17 AM 01/26/2023   10:31 AM  In your present state of health, do you have any difficulty performing the following activities:  Hearing? 0 0  Vision? 0 0  Difficulty concentrating or making decisions? 0 0  Walking or climbing stairs? 1 0  Comment left knee injury   Dressing or bathing? 0 0  Doing errands, shopping? 0 0  Preparing Food and eating ? N   Using the Toilet? N   In the past six months, have you accidently leaked urine? N   Do you have problems with loss of bowel control? N   Managing your Medications? N   Managing your Finances? N   Housekeeping or managing your Housekeeping? N     Patient Care Team: Marjie Skiff, NP as PCP - General (Nurse Practitioner) End, Cristal Deer, MD as PCP - Cardiology (Cardiology) Christena Deem, MD (Inactive) as Consulting Physician (Gastroenterology) Minor, Theadora Rama, RN (Inactive) as [facility] Care Management Donneta Romberg, Worthy Flank, MD as Consulting Physician (Oncology)  Indicate any recent Medical Services you may have received from other than [facility] providers in the past year (date may be approximate).     Assessment:   This is a routine wellness examination for Jimi.  Hearing/Vision screen Hearing Screening - Comments:: Denies hearing loss  Dietary issues and exercise activities discussed:     Goals Addressed               This Visit's Progress     DIET - INCREASE WATER INTAKE (pt-stated)        Depression Screen    07/17/2023   11:28 AM 11/14/2022    8:57 AM 07/26/2022    9:49 AM 06/14/2022   11:09 AM 05/03/2022    9:19 AM 03/22/2022   11:02 AM 01/24/2022    8:12 AM  PHQ 2/9 Scores  PHQ - 2 Score 0 0 0 1 2 2  0  PHQ- 9 Score 0 1 1 4 4 4  0    Fall Risk    07/17/2023   11:31 AM 11/14/2022    8:56 AM 07/26/2022    9:49 AM 07/04/2022    1:27 PM 05/03/2022    9:18 AM  Fall Risk   Falls in the past year? 1 0 1 1 0  Number falls in past yr: 0 0 0 0 0  Injury with Fall? 0 0 0 0 0  Risk for fall due to : History of fall(s) No Fall Risks History of fall(s)  No Fall Risks  Follow up Falls prevention discussed Falls evaluation completed Falls evaluation completed Falls evaluation completed;Education provided;Falls prevention discussed Falls evaluation completed    MEDICARE RISK AT HOME:  Medicare Risk at Home - 07/17/23 1132     Any stairs in or around the home? Yes    If so, are there any without handrails? No    Home  free of loose throw rugs in walkways, pet beds, electrical cords, etc? Yes    Adequate lighting in your home to reduce risk of falls? Yes    Life alert? No    Use of a cane, walker or w/c? No    Grab bars in the bathroom? Yes    Shower chair or bench in shower? Yes    Elevated toilet seat or a handicapped toilet? Yes             TIMED UP AND GO:  Was the test performed?  No    Cognitive Function:        07/17/2023   11:33 AM  07/04/2022    1:29 PM 06/20/2021    9:10 AM 06/11/2020    9:09 AM 08/19/2019    2:23 PM  6CIT Screen  What Year? 0 points 0 points 0 points 0 points 0 points  What month? 0 points 0 points 0 points 0 points 0 points  What time? 0 points 0 points 0 points 0 points 0 points  Count back from 20 0 points 0 points 0 points 0 points 0 points  Months in reverse 0 points 0 points 0 points 0 points 0 points  Repeat phrase 0 points 0 points 0 points 0 points 0 points  Total Score 0 points 0 points 0 points 0 points 0 points    Immunizations Immunization History  Administered Date(s) Administered   Fluad Quad(high Dose 65+) 10/21/2019, 09/14/2020, 09/15/2021, 01/26/2023   Influenza, High Dose Seasonal PF 11/29/2016, 10/11/2017, 10/28/2018   Influenza,inj,Quad PF,6+ Mos 11/22/2015   Influenza-Unspecified 11/10/2014   PFIZER(Purple Top)SARS-COV-2 Vaccination 01/13/2020, 02/03/2020, 09/10/2020   Pneumococcal Conjugate-13 05/12/2014   Pneumococcal Polysaccharide-23 04/11/2010   Td 05/12/2014   Zoster, Live 10/08/2008    TDAP status: Up to date  Flu Vaccine status: Due, Education has been provided regarding the importance of this vaccine. Advised may receive this vaccine at local pharmacy or Health Dept. Aware to provide a copy of the vaccination record if obtained from local pharmacy or Health Dept. Verbalized acceptance and understanding.  Pneumococcal vaccine status: Up to date  Covid-19 vaccine status: Declined, Education has been provided regarding the importance of this vaccine but patient still declined. Advised may receive this vaccine at local pharmacy or Health Dept.or vaccine clinic. Aware to provide a copy of the vaccination record if obtained from local pharmacy or Health Dept. Verbalized acceptance and understanding.  Qualifies for Shingles Vaccine? Yes   Zostavax completed No   Shingrix Completed?: No.    Education has been provided regarding the importance of this vaccine. Patient  has been advised to call insurance company to determine out of pocket expense if they have not yet received this vaccine. Advised may also receive vaccine at local pharmacy or Health Dept. Verbalized acceptance and understanding.  Screening Tests Health Maintenance  Topic Date Due   Zoster Vaccines- Shingrix (1 of 2) 01/11/1965   COVID-19 Vaccine (4 - 2023-24 season) 08/11/2022   INFLUENZA VACCINE  07/12/2023   DTaP/Tdap/Td (2 - Tdap) 05/12/2024   Medicare Annual Wellness (AWV)  07/16/2024   Colonoscopy  06/19/2025   Pneumonia Vaccine 66+ Years old  Completed   Hepatitis C Screening  Completed   HPV VACCINES  Aged Out    Health Maintenance  Health Maintenance Due  Topic Date Due   Zoster Vaccines- Shingrix (1 of 2) 01/11/1965   COVID-19 Vaccine (4 - 2023-24 season) 08/11/2022   INFLUENZA VACCINE  07/12/2023  Colorectal cancer screening: Type of screening: Colonoscopy. Completed 06/19/22. Repeat every 3 years  Lung Cancer Screening: (Low Dose CT Chest recommended if Age 28-80 years, 20 pack-year currently smoking OR have quit w/in 15years.) does not qualify.   Lung Cancer Screening Referral: n/a  Additional Screening:  Hepatitis C Screening: does not qualify; Completed 10/30/18  Vision Screening: Recommended annual ophthalmology exams for early detection of glaucoma and other disorders of the eye. Is the patient up to date with their annual eye exam?  Yes  Who is the provider or what is the name of the office in which the patient attends annual eye exams? [facility], next OV 11/2023 If pt is not established with a provider, would they like to be referred to a provider to establish care? No .   Dental Screening: Recommended annual dental exams for proper oral hygiene  Diabetic Foot Exam: Diabetic Foot Exam: Completed 01/26/23  Community Resource Referral / Chronic Care Management: CRR required this visit?  No   CCM required this visit?  No     Plan:     I  have personally reviewed and noted the following in the patient's chart:   Medical and social history Use of alcohol, tobacco or illicit drugs  Current medications and supplements including opioid prescriptions. Patient is not currently taking opioid prescriptions. Functional ability and status Nutritional status Physical activity Advanced directives List of other physicians Hospitalizations, surgeries, and ER visits in previous 12 months Vitals Screenings to include cognitive, depression, and falls Referrals and appointments  In addition, I have reviewed and discussed with patient certain preventive protocols, quality metrics, and best practice recommendations. A written personalized care plan for preventive services as well as general preventive health recommendations were provided to patient.     Tora Kindred, CMA   07/17/2023   After Visit Summary: (MyChart) Due to this being a telephonic visit, the after visit summary with patients personalized plan was offered to patient via MyChart   Nurse Notes:  Referral placed for DM & Nutrition education. Needs flu vaccine in the fall. Declined Covid vaccine.

## 2023-07-17 NOTE — Patient Instructions (Addendum)
[name] , Thank you for taking time to come for your Medicare Wellness Visit. I appreciate your ongoing commitment to your health goals. Please review the following plan we discussed and let me know if I can assist you in the future.   Referrals/Orders/Follow-Ups/Clinician Recommendations: I have placed a referral to diabetes and nutrition education at [facility]. They should call you with an appointment. Get your flu vaccine this fall.  This is a list of the screening recommended for you and due dates:  Health Maintenance  Topic Date Due   Zoster (Shingles) Vaccine (1 of 2) 01/11/1965   COVID-19 Vaccine (4 - 2023-24 season) 08/11/2022   Flu Shot  07/12/2023   DTaP/Tdap/Td vaccine (2 - Tdap) 05/12/2024   Medicare Annual Wellness Visit  07/16/2024   Colon Cancer Screening  06/19/2025   Pneumonia Vaccine  Completed   Hepatitis C Screening  Completed   HPV Vaccine  Aged Out    Advanced directives: (Copy Requested) Please bring a copy of your health care power of attorney and living will to the office to be added to your chart at your convenience.  Next Medicare Annual Wellness Visit scheduled for next year: Yes, 07/22/24 @ 11:15am  Preventive Care 78 Years and Older, Male  Preventive care refers to lifestyle choices and visits with your health care provider that can promote health and wellness. What does preventive care include? A yearly physical exam. This is also called an annual well check. Dental exams once or twice a year. Routine eye exams. Ask your health care provider how often you should have your eyes checked. Personal lifestyle choices, including: Daily care of your teeth and gums. Regular physical activity. Eating a healthy diet. Avoiding tobacco and drug use. Limiting alcohol use. Practicing safe sex. Taking low doses of aspirin every day. Taking vitamin and mineral supplements as recommended by your health care provider. What happens during an annual well check? The  services and screenings done by your health care provider during your annual well check will depend on your age, overall health, lifestyle risk factors, and family history of disease. Counseling  Your health care provider may ask you questions about your: Alcohol use. Tobacco use. Drug use. Emotional well-being. Home and relationship well-being. Sexual activity. Eating habits. History of falls. Memory and ability to understand (cognition). Work and work Astronomer. Screening  You may have the following tests or measurements: Height, weight, and BMI. Blood pressure. Lipid and cholesterol levels. These may be checked every 5 years, or more frequently if you are over 33 years old. Skin check. Lung cancer screening. You may have this screening every year starting at age 78 if you have a 30-pack-year history of smoking and currently smoke or have quit within the past 15 years. Fecal occult blood test (FOBT) of the stool. You may have this test every year starting at age 78. Flexible sigmoidoscopy or colonoscopy. You may have a sigmoidoscopy every 5 years or a colonoscopy every 10 years starting at age 78. Prostate cancer screening. Recommendations will vary depending on your family history and other risks. Hepatitis C blood test. Hepatitis B blood test. Sexually transmitted disease (STD) testing. Diabetes screening. This is done by checking your blood sugar (glucose) after you have not eaten for a while (fasting). You may have this done every 1-3 years. Abdominal aortic aneurysm (AAA) screening. You may need this if you are a current or former smoker. Osteoporosis. You may be screened starting at age 78 if you are at  high risk. Talk with your health care provider about your test results, treatment options, and if necessary, the need for more tests. Vaccines  Your health care provider may recommend certain vaccines, such as: Influenza vaccine. This is recommended every year. Tetanus,  diphtheria, and acellular pertussis (Tdap, Td) vaccine. You may need a Td booster every 10 years. Zoster vaccine. You may need this after age 70. Pneumococcal 13-valent conjugate (PCV13) vaccine. One dose is recommended after age 78. Pneumococcal polysaccharide (PPSV23) vaccine. One dose is recommended after age 78. Talk to your health care provider about which screenings and vaccines you need and how often you need them. This information is not intended to replace advice given to you by your health care provider. Make sure you discuss any questions you have with your health care provider. Document Released: 12/24/2015 Document Revised: 08/16/2016 Document Reviewed: 09/28/2015 Elsevier Interactive Patient Education  2017 [employer].  Fall Prevention in the Home Falls can cause injuries. They can happen to people of all ages. There are many things you can do to make your home safe and to help prevent falls. What can I do on the outside of my home? Regularly fix the edges of walkways and driveways and fix any cracks. Remove anything that might make you trip as you walk through a door, such as a raised step or threshold. Trim any bushes or trees on the path to your home. Use bright outdoor lighting. Clear any walking paths of anything that might make someone trip, such as rocks or tools. Regularly check to see if handrails are loose or broken. Make sure that both sides of any steps have handrails. Any raised decks and porches should have guardrails on the edges. Have any leaves, snow, or ice cleared regularly. Use sand or salt on walking paths during winter. Clean up any spills in your garage right away. This includes oil or grease spills. What can I do in the bathroom? Use night lights. Install grab bars by the toilet and in the tub and shower. Do not use towel bars as grab bars. Use non-skid mats or decals in the tub or shower. If you need to sit down in the shower, use a plastic,  non-slip stool. Keep the floor dry. Clean up any water that spills on the floor as soon as it happens. Remove soap buildup in the tub or shower regularly. Attach bath mats securely with double-sided non-slip rug tape. Do not have throw rugs and other things on the floor that can make you trip. What can I do in the bedroom? Use night lights. Make sure that you have a light by your bed that is easy to reach. Do not use any sheets or blankets that are too big for your bed. They should not hang down onto the floor. Have a firm chair that has side arms. You can use this for support while you get dressed. Do not have throw rugs and other things on the floor that can make you trip. What can I do in the kitchen? Clean up any spills right away. Avoid walking on wet floors. Keep items that you use a lot in easy-to-reach places. If you need to reach something above you, use a strong step stool that has a grab bar. Keep electrical cords out of the way. Do not use floor polish or wax that makes floors slippery. If you must use wax, use non-skid floor wax. Do not have throw rugs and other things on the floor that  can make you trip. What can I do with my stairs? Do not leave any items on the stairs. Make sure that there are handrails on both sides of the stairs and use them. Fix handrails that are broken or loose. Make sure that handrails are as long as the stairways. Check any carpeting to make sure that it is firmly attached to the stairs. Fix any carpet that is loose or worn. Avoid having throw rugs at the top or bottom of the stairs. If you do have throw rugs, attach them to the floor with carpet tape. Make sure that you have a light switch at the top of the stairs and the bottom of the stairs. If you do not have them, ask someone to add them for you. What else can I do to help prevent falls? Wear shoes that: Do not have high heels. Have rubber bottoms. Are comfortable and fit you well. Are closed  at the toe. Do not wear sandals. If you use a stepladder: Make sure that it is fully opened. Do not climb a closed stepladder. Make sure that both sides of the stepladder are locked into place. Ask someone to hold it for you, if possible. Clearly mark and make sure that you can see: Any grab bars or handrails. First and last steps. Where the edge of each step is. Use tools that help you move around (mobility aids) if they are needed. These include: Canes. Walkers. Scooters. Crutches. Turn on the lights when you go into a dark area. Replace any light bulbs as soon as they burn out. Set up your furniture so you have a clear path. Avoid moving your furniture around. If any of your floors are uneven, fix them. If there are any pets around you, be aware of where they are. Review your medicines with your doctor. Some medicines can make you feel dizzy. This can increase your chance of falling. Ask your doctor what other things that you can do to help prevent falls. This information is not intended to replace advice given to you by your health care provider. Make sure you discuss any questions you have with your health care provider. Document Released: 09/23/2009 Document Revised: 05/04/2016 Document Reviewed: 01/01/2015 Elsevier Interactive Patient Education  2017 [employer].

## 2023-07-21 ENCOUNTER — Other Ambulatory Visit: Payer: Self-pay | Admitting: Nurse Practitioner

## 2023-07-23 NOTE — Telephone Encounter (Signed)
Requested Prescriptions  Pending Prescriptions Disp Refills   metoprolol tartrate (LOPRESSOR) 25 MG tablet [Pharmacy Med Name: METOPROLOL TARTRATE 25MG  TABLETS] 180 tablet 1    Sig: TAKE 1 TABLET BY MOUTH TWICE DAILY     Cardiovascular:  Beta Blockers Passed - 07/21/2023  8:09 AM      Passed - Last BP in normal range    BP Readings from Last 1 Encounters:  06/04/23 136/78         Passed - Last Heart Rate in normal range    Pulse Readings from Last 1 Encounters:  06/04/23 75         Passed - Valid encounter within last 6 months    Recent Outpatient Visits           5 months ago Chronic heart failure with preserved ejection fraction (HCC)   [place] [facility] [place], Corrie Dandy T, NP   8 months ago Acute cough   [place] [facility] Gabriel Cirri, NP   8 months ago Acute cough   [place] [facility] Gabriel Cirri, NP   12 months ago Depression, recurrent [facility])   [place] [facility] [place], Corrie Dandy T, NP   1 year ago Morbid obesity [facility])   [place] [facility] [place], Dorie Rank, NP       Future Appointments             In 4 days Cann[place], Dorie Rank, NP [place] [facility]

## 2023-07-26 ENCOUNTER — Other Ambulatory Visit: Payer: Self-pay | Admitting: *Deleted

## 2023-07-26 DIAGNOSIS — C884 Extranodal marginal zone B-cell lymphoma of mucosa-associated lymphoid tissue [MALT-lymphoma]: Secondary | ICD-10-CM

## 2023-07-27 ENCOUNTER — Inpatient Hospital Stay: Payer: Medicare Other

## 2023-07-27 ENCOUNTER — Ambulatory Visit: Payer: Medicare Other | Admitting: Nurse Practitioner

## 2023-07-27 ENCOUNTER — Encounter: Payer: Self-pay | Admitting: Internal Medicine

## 2023-07-27 ENCOUNTER — Inpatient Hospital Stay: Payer: Medicare Other | Attending: Internal Medicine | Admitting: Internal Medicine

## 2023-07-27 VITALS — BP 163/88 | HR 79 | Temp 98.2°F | Ht 71.0 in | Wt 260.8 lb

## 2023-07-27 DIAGNOSIS — R7989 Other specified abnormal findings of blood chemistry: Secondary | ICD-10-CM | POA: Diagnosis not present

## 2023-07-27 DIAGNOSIS — R7303 Prediabetes: Secondary | ICD-10-CM

## 2023-07-27 DIAGNOSIS — K2270 Barrett's esophagus without dysplasia: Secondary | ICD-10-CM

## 2023-07-27 DIAGNOSIS — D692 Other nonthrombocytopenic purpura: Secondary | ICD-10-CM

## 2023-07-27 DIAGNOSIS — I10 Essential (primary) hypertension: Secondary | ICD-10-CM | POA: Insufficient documentation

## 2023-07-27 DIAGNOSIS — F339 Major depressive disorder, recurrent, unspecified: Secondary | ICD-10-CM

## 2023-07-27 DIAGNOSIS — Z8572 Personal history of non-Hodgkin lymphomas: Secondary | ICD-10-CM | POA: Insufficient documentation

## 2023-07-27 DIAGNOSIS — F419 Anxiety disorder, unspecified: Secondary | ICD-10-CM

## 2023-07-27 DIAGNOSIS — E669 Obesity, unspecified: Secondary | ICD-10-CM | POA: Insufficient documentation

## 2023-07-27 DIAGNOSIS — C884 Extranodal marginal zone b-cell lymphoma of mucosa-associated lymphoid tissue (malt-lymphoma) not having achieved remission: Secondary | ICD-10-CM

## 2023-07-27 DIAGNOSIS — I25118 Atherosclerotic heart disease of native coronary artery with other forms of angina pectoris: Secondary | ICD-10-CM

## 2023-07-27 DIAGNOSIS — D7589 Other specified diseases of blood and blood-forming organs: Secondary | ICD-10-CM | POA: Insufficient documentation

## 2023-07-27 DIAGNOSIS — Z79899 Other long term (current) drug therapy: Secondary | ICD-10-CM

## 2023-07-27 DIAGNOSIS — E785 Hyperlipidemia, unspecified: Secondary | ICD-10-CM

## 2023-07-27 DIAGNOSIS — I5032 Chronic diastolic (congestive) heart failure: Secondary | ICD-10-CM

## 2023-07-27 LAB — CMP (CANCER CENTER ONLY)
ALT: 46 U/L — ABNORMAL HIGH (ref 0–44)
AST: 50 U/L — ABNORMAL HIGH (ref 15–41)
Albumin: 4.3 g/dL (ref 3.5–5.0)
Alkaline Phosphatase: 59 U/L (ref 38–126)
Anion gap: 11 (ref 5–15)
BUN: 16 mg/dL (ref 8–23)
CO2: 27 mmol/L (ref 22–32)
Calcium: 9.6 mg/dL (ref 8.9–10.3)
Chloride: 100 mmol/L (ref 98–111)
Creatinine: 0.91 mg/dL (ref 0.61–1.24)
GFR, Estimated: >60 mL/min (ref >60)
Glucose, Bld: 105 mg/dL — ABNORMAL HIGH (ref 70–99)
Potassium: 3.7 mmol/L (ref 3.5–5.1)
Sodium: 138 mmol/L (ref 135–145)
Total Bilirubin: 0.9 mg/dL (ref 0.3–1.2)
Total Protein: 7.5 g/dL (ref 6.5–8.1)

## 2023-07-27 LAB — CBC WITH DIFFERENTIAL (CANCER CENTER ONLY)
Abs Immature Granulocytes: 0.03 10*3/uL (ref 0.00–0.07)
Basophils Absolute: 0.1 10*3/uL (ref 0.0–0.1)
Basophils Relative: 2 %
Eosinophils Absolute: 0.3 10*3/uL (ref 0.0–0.5)
Eosinophils Relative: 5 %
HCT: 40.7 % (ref 39.0–52.0)
Hemoglobin: 13.9 g/dL (ref 13.0–17.0)
Immature Granulocytes: 0 %
Lymphocytes Relative: 37 %
Lymphs Abs: 2.7 10*3/uL (ref 0.7–4.0)
MCH: 35.8 pg — ABNORMAL HIGH (ref 26.0–34.0)
MCHC: 34.2 g/dL (ref 30.0–36.0)
MCV: 104.9 fL — ABNORMAL HIGH (ref 80.0–100.0)
Monocytes Absolute: 1.1 10*3/uL — ABNORMAL HIGH (ref 0.1–1.0)
Monocytes Relative: 16 %
Neutro Abs: 3 10*3/uL (ref 1.7–7.7)
Neutrophils Relative %: 40 %
Platelet Count: 298 10*3/uL (ref 150–400)
RBC: 3.88 MIL/uL — ABNORMAL LOW (ref 4.22–5.81)
RDW: 12.7 % (ref 11.5–15.5)
WBC Count: 7.3 10*3/uL (ref 4.0–10.5)
nRBC: 0.3 % — ABNORMAL HIGH (ref 0.0–0.2)

## 2023-07-27 LAB — LACTATE DEHYDROGENASE: LDH: 178 U/L (ref 98–192)

## 2023-07-27 NOTE — Progress Notes (Signed)
[facility] CONSULT NOTE  Patient Care Team: Marjie Skiff, NP as PCP - General (Nurse Practitioner) End, Cristal Deer, MD as PCP - Cardiology (Cardiology) Minor, Theadora Rama, RN (Inactive) as [facility] Care Management Donneta Romberg, Worthy Flank, MD as Consulting Physician (Oncology) Jaynie Collins, DO as Consulting Physician (Gastroenterology)  CHIEF COMPLAINTS/PURPOSE OF CONSULTATION: Lymphoma  #  Oncology History Overview Note  # NOV 2019- MALT [II opinion at [facility] path]; s/p sigmoid colon polyp resection [Dr.Skulskie; incidental]; PET scan -NED  # Barrett's esophagus- 2018;#Colonic polyps surveillance/Dr. 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  Ambulating dependently.  Jesse Norton 77 y.o.  male is here for follow-up of his MALT lymphoma of the colon/polyp status post resection.  C /o lt knee pain 3/10; on brace.  Currently s/p Echo done by Dr. Okey Dupre.  Patient denies any new lumps or bumps.  Denies any nausea vomiting.  No night sweats.  Patient has lost some weight since last visit.  He has been trying to be physically active.  Review of Systems  Constitutional:  Positive for malaise/fatigue. Negative for chills, diaphoresis, fever and weight loss.  HENT:  Negative for nosebleeds and sore throat.   Eyes:  Negative for double vision.  Respiratory:  Negative for cough, hemoptysis, sputum production, shortness of breath and wheezing.   Cardiovascular:  Negative for chest pain, palpitations, orthopnea  and leg swelling.  Gastrointestinal:  Negative for abdominal pain, blood in stool, constipation, diarrhea, heartburn, melena, nausea and vomiting.  Genitourinary:  Negative for dysuria, frequency and urgency.  Musculoskeletal:  Negative for back pain and joint pain.  Skin: Negative.  Negative for itching and rash.  Neurological:  Negative for dizziness, tingling, focal weakness, weakness and headaches.  Endo/Heme/Allergies:  Does not bruise/bleed easily.  Psychiatric/Behavioral:  Negative for depression. The patient is not nervous/anxious and does not have insomnia.      MEDICAL HISTORY:  Past Medical History:  Diagnosis Date   (HFpEF) heart failure with preserved ejection fraction (HCC)    a. 01/2019 Cath: EF 55-60% w/ elevated LVEDP.   Anxiety    Barrett's esophagus    CAD (coronary artery disease)    a. 10/2018 ETT: 1mm horizontal inflat ST depression, freq PVC's->Intermediate; b. 12/2018 Cardiac CTA: LM nl, LAD FFRct 0.6 in mLAD, LCX FFRct 0.99p, 0.8m, 0.77d, RCA no signif dzs, FFRct 0.89d->Rec cath; c. 01/2019 PCI: LM nl, LAD 40p, 8m, D2 70, LCX 70p (2.75x15 Resolute Onyx DES), RCA min irregs. EF 55-65%. EDP 25-24mmHg; d. 01/2019 PCI: LAD 80-90 (atherectomy & 3x15 Resolute Onyx DES).   Colon polyps    Duodenal adenoma    Gastritis    GERD (gastroesophageal reflux disease)    Gout    H. pylori infection    H/O urethral stricture    Hemorrhoids    Hiatal hernia    Hypertension    Stomach ulcer     SURGICAL HISTORY: Past Surgical History:  Procedure Laterality Date   CARDIAC CATHETERIZATION     CATARACT  EXTRACTION W/PHACO Left 03/07/2022   Procedure: CATARACT EXTRACTION PHACO AND INTRAOCULAR LENS PLACEMENT (IOC) LEFT VIVITY TORIC LENS 10.41 00:55.7;  Surgeon: Galen Manila, MD;  Location: [facility];  Service: Ophthalmology;  Laterality: Left;   CATARACT EXTRACTION W/PHACO Right 03/28/2022   Procedure: CATARACT EXTRACTION PHACO AND INTRAOCULAR LENS PLACEMENT (IOC)  RIGHT VIVITY LENS 6.68 00:50.0;  Surgeon: Galen Manila, MD;  Location: [facility];  Service: Ophthalmology;  Laterality: Right;   COLONOSCOPY     COLONOSCOPY N/A 06/19/2022   Procedure: COLONOSCOPY;  Surgeon: Jaynie Collins, DO;  Location: [facility] ENDOSCOPY;  Service: Gastroenterology;  Laterality: N/A;   COLONOSCOPY WITH PROPOFOL N/A 08/20/2018   Procedure: COLONOSCOPY WITH PROPOFOL;  Surgeon: Christena Deem, MD;  Location: [facility] ENDOSCOPY;  Service: Endoscopy;  Laterality: N/A;   COLONOSCOPY WITH PROPOFOL N/A 12/19/2018   Procedure: COLONOSCOPY WITH PROPOFOL;  Surgeon: Christena Deem, MD;  Location: [facility] ENDOSCOPY;  Service: Endoscopy;  Laterality: N/A;   CORONARY ANGIOPLASTY     CORONARY ATHERECTOMY N/A 02/03/2019   Procedure: CORONARY ATHERECTOMY;  Surgeon: Yvonne Kendall, MD;  Location: [facility] INVASIVE CV LAB;  Service: Cardiovascular;  Laterality: N/A;   CORONARY STENT INTERVENTION N/A 01/21/2019   Procedure: CORONARY STENT INTERVENTION;  Surgeon: Yvonne Kendall, MD;  Location: [facility] INVASIVE CV LAB;  Service: Cardiovascular;  Laterality: N/A;   CORONARY STENT INTERVENTION N/A 02/03/2019   Procedure: CORONARY STENT INTERVENTION;  Surgeon: Yvonne Kendall, MD;  Location: [facility] INVASIVE CV LAB;  Service: Cardiovascular;  Laterality: N/A;   CORONARY ULTRASOUND/IVUS N/A 02/03/2019   Procedure: Intravascular Ultrasound/IVUS;  Surgeon: Yvonne Kendall, MD;  Location: [facility] INVASIVE CV LAB;  Service: Cardiovascular;  Laterality: N/A;   CYSTOSCOPY WITH URETHRAL DILATATION N/A 08/16/2018   Procedure: CYSTOSCOPY WITH URETHRAL DILATATION;  Surgeon: Sondra Come, MD;  Location: [facility] ORS;  Service: Urology;  Laterality: N/A;   ESOPHAGOGASTRODUODENOSCOPY N/A 01/29/2017   Procedure: ESOPHAGOGASTRODUODENOSCOPY (EGD);  Surgeon: Christena Deem, MD;  Location: [facility] ENDOSCOPY;  Service: Endoscopy;  Laterality: N/A;   ESOPHAGOGASTRODUODENOSCOPY N/A 06/19/2022   Procedure:  ESOPHAGOGASTRODUODENOSCOPY (EGD);  Surgeon: Jaynie Collins, DO;  Location: [facility] ENDOSCOPY;  Service: Gastroenterology;  Laterality: N/A;   ESOPHAGOGASTRODUODENOSCOPY (EGD) WITH PROPOFOL N/A 07/10/2016   Procedure: ESOPHAGOGASTRODUODENOSCOPY (EGD) WITH PROPOFOL;  Surgeon: Christena Deem, MD;  Location: [facility] ENDOSCOPY;  Service: Endoscopy;  Laterality: N/A;   ESOPHAGOGASTRODUODENOSCOPY (EGD) WITH PROPOFOL N/A 12/19/2018   Procedure: ESOPHAGOGASTRODUODENOSCOPY (EGD) WITH PROPOFOL;  Surgeon: Christena Deem, MD;  Location: [facility] ENDOSCOPY;  Service: Endoscopy;  Laterality: N/A;   EYE SURGERY     gastritis     h.pylori     left elbow repair Left    LEFT HEART CATH AND CORONARY ANGIOGRAPHY Left 01/21/2019   Procedure: LEFT HEART CATH AND CORONARY ANGIOGRAPHY;  Surgeon: Yvonne Kendall, MD;  Location: [facility] INVASIVE CV LAB;  Service: Cardiovascular;  Laterality: Left;   SPLENECTOMY  1957   TONSILLECTOMY      SOCIAL HISTORY: Social History   Socioeconomic History   Marital status: Married    Spouse name: Marylu Lund   Number of children: 2   Years of education: Not on file   Highest education level: Master's degree (e.g., MA, MS, MEng, MEd, MSW, MBA)  Occupational History   Occupation: retired     Comment: Psychologist, occupational  Tobacco Use   Smoking status: Never   Smokeless tobacco: Never  Vaping Use   Vaping status: Never Used  Substance and Sexual Activity   Alcohol use: Not Currently  Alcohol/week: 14.0 standard drinks of alcohol    Types: 14 Glasses of wine per week    Comment: moderate-"a couple of glasses of wine each night"   Drug use: No   Sexual activity: Yes  Other Topics Concern   Not on file  Social History Narrative   Married, 2 sons, 1 in [place], [place] and 1 in [employer], stationary bike      No smoking.  Occasional alcohol.  Lives at home.  Retired Psychologist, occupational.   Social Determinants of Health   Financial Resource Strain: Low Risk  (07/17/2023)   Overall  Financial Resource Strain (CARDIA)    Difficulty of Paying Living Expenses: Not hard at all  Food Insecurity: No Food Insecurity (07/17/2023)   Hunger Vital Sign    Worried About Running Out of Food in the Last Year: Never true    Ran Out of Food in the Last Year: Never true  Transportation Needs: No Transportation Needs (07/17/2023)   PRAPARE - Administrator, Civil Service (Medical): No    Lack of Transportation (Non-Medical): No  Physical Activity: Insufficiently Active (07/17/2023)   Exercise Vital Sign    Days of Exercise per Week: 4 days    Minutes of Exercise per Session: 30 min  Stress: No Stress Concern Present (07/17/2023)   [employer] of Occupational Health - Occupational Stress Questionnaire    Feeling of Stress : Only a little  Social Connections: Moderately Integrated (07/17/2023)   Social Connection and Isolation Panel [NHANES]    Frequency of Communication with Friends and Family: Three times a week    Frequency of Social Gatherings with Friends and Family: Three times a week    Attends Religious Services: More than 4 times per year    Active Member of Clubs or Organizations: No    Attends Banker Meetings: Never    Marital Status: Married  Catering manager Violence: Not At Risk (07/17/2023)   Humiliation, Afraid, Rape, and Kick questionnaire    Fear of Current or Ex-Partner: No    Emotionally Abused: No    Physically Abused: No    Sexually Abused: No    FAMILY HISTORY: Family History  Adopted: Yes  Problem Relation Age of Onset   Heart attack Mother 58   Pancreatic cancer Father        died in WWII - pt adopted   Hypertension Sister    Hyperlipidemia Sister    Hypertension Brother    Hyperlipidemia Brother    Prostate cancer Neg Hx    Kidney cancer Neg Hx    Bladder Cancer Neg Hx     ALLERGIES:  has No Known Allergies.  MEDICATIONS:  Current Outpatient Medications  Medication Sig Dispense Refill   allopurinol (ZYLOPRIM) 300 MG  tablet Take 1 tablet (300 mg total) by mouth 3 (three) times a week. 90 tablet 4   amLODipine (NORVASC) 10 MG tablet TAKE 1 TABLET(10 MG) BY MOUTH DAILY 90 tablet 4   atorvastatin (LIPITOR) 40 MG tablet TAKE 1 TABLET(40 MG) BY MOUTH DAILY 90 tablet 4   clobetasol ointment (TEMOVATE) 0.05 % Apply topically as needed.     clopidogrel (PLAVIX) 75 MG tablet TAKE 1 TABLET(75 MG) BY MOUTH DAILY WITH BREAKFAST 90 tablet 4   furosemide (LASIX) 20 MG tablet Take 1 tablet (20 mg total) by mouth daily. 90 tablet 4   losartan (COZAAR) 100 MG tablet TAKE 1 TABLET(100 MG) BY MOUTH DAILY 90 tablet 4  metoprolol tartrate (LOPRESSOR) 25 MG tablet TAKE 1 TABLET BY MOUTH TWICE DAILY 180 tablet 1   nitroGLYCERIN (NITROSTAT) 0.4 MG SL tablet Place 1 tablet (0.4 mg total) under the tongue every 5 (five) minutes as needed for chest pain. Maximum of 3 doses. 25 tablet 3   OVER THE COUNTER MEDICATION AGI (athletic greens) powder form by mouth once daily     pantoprazole (PROTONIX) 20 MG tablet TAKE 1 TABLET(20 MG) BY MOUTH TWICE DAILY 180 tablet 4   Semaglutide,0.25 or 0.5MG /DOS, (OZEMPIC, 0.25 OR 0.5 MG/DOSE,) 2 MG/1.5ML SOPN Inject 0.5 MG in the skin weekly for prediabetes. 1.5 mL 5   budesonide-formoterol (SYMBICORT) 160-4.5 MCG/ACT inhaler Inhale 2 puffs into the lungs 2 (two) times daily. (Patient not taking: Reported on 07/17/2023) 1 each 1   LORazepam (ATIVAN) 1 MG tablet Take 0.5-1 tablets (0.5-1 mg total) by mouth daily as needed for anxiety. (Patient not taking: Reported on 01/26/2023) 30 tablet 2   No current facility-administered medications for this visit.      Marland Kitchen  PHYSICAL EXAMINATION: ECOG PERFORMANCE STATUS: 0 - Asymptomatic  Vitals:   07/27/23 1027  BP: (!) 163/88  Pulse: 79  Temp: 98.2 F (36.8 C)  SpO2: 96%   Filed Weights   07/27/23 1027  Weight: 260 lb 12.8 oz (118.3 kg)    Physical Exam HENT:     Head: Normocephalic and atraumatic.     Mouth/Throat:     Pharynx: No oropharyngeal  exudate.  Eyes:     Pupils: Pupils are equal, round, and reactive to light.  Cardiovascular:     Rate and Rhythm: Normal rate and regular rhythm.  Pulmonary:     Effort: No respiratory distress.     Breath sounds: No wheezing.  Abdominal:     General: Bowel sounds are normal. There is no distension.     Palpations: Abdomen is soft. There is no mass.     Tenderness: There is no abdominal tenderness. There is no guarding or rebound.  Musculoskeletal:        General: No tenderness. Normal range of motion.     Cervical back: Normal range of motion and neck supple.  Skin:    General: Skin is warm.  Neurological:     Mental Status: He is alert and oriented to person, place, and time.  Psychiatric:        Mood and Affect: Affect normal.      LABORATORY DATA:  I have reviewed the data as listed Lab Results  Component Value Date   WBC 7.3 07/27/2023   HGB 13.9 07/27/2023   HCT 40.7 07/27/2023   MCV 104.9 (H) 07/27/2023   PLT 298 07/27/2023   Recent Labs    01/26/23 0946 07/27/23 1021  NA 143 138  K 3.9 3.7  CL 101 100  CO2 23 27  GLUCOSE 100* 105*  BUN 11 16  CREATININE 0.90 0.91  CALCIUM 9.5 9.6  GFRNONAA  --  >60  PROT 7.0 7.5  ALBUMIN 4.6 4.3  AST 64* 50*  ALT 57* 46*  ALKPHOS 74 59  BILITOT 0.6 0.9    RADIOGRAPHIC STUDIES: I have personally reviewed the radiological images as listed and agreed with the findings in the report. ECHOCARDIOGRAM COMPLETE  Result Date: 07/16/2023    ECHOCARDIOGRAM REPORT   Patient Name:   Jesse Norton Date of Exam: 07/16/2023 Medical Rec #:  [id number]        Height:       67.0  in Accession #:    [id number]       Weight:       263.2 lb Date of Birth:  01-13-1946        BSA:          2.272 m Patient Age:    77 years         BP:           136/78 mmHg Patient Gender: M                HR:           84 bpm. Exam Location:  [facility] Procedure: 2D Echo, Cardiac Doppler and Color Doppler Indications:    R06.9 DOE  History:        Patient  has no prior history of Echocardiogram examinations.                 CHF, CAD, Signs/Symptoms:Shortness of Breath, Dyspnea and Edema;                 Risk Factors:Hypertension and Dyslipidemia.  Sonographer:    Ilda Mori RDCS, MHA Referring Phys: [phone] CHRISTOPHER END  Sonographer Comments: Suboptimal parasternal window and patient is obese. Image acquisition challenging due to patient body habitus. IMPRESSIONS  1. Left ventricular ejection fraction, by estimation, is 60 to 65%. The left ventricle has normal function. The left ventricle has no regional wall motion abnormalities. Left ventricular diastolic parameters were normal.  2. Right ventricular systolic function is normal. The right ventricular size is normal.  3. The mitral valve is normal in structure. Mild mitral valve regurgitation.  4. The aortic valve was not well visualized. Aortic valve regurgitation is not visualized.  5. Aortic dilatation noted. There is mild dilatation of the ascending aorta, measuring 38 mm. FINDINGS  Left Ventricle: Left ventricular ejection fraction, by estimation, is 60 to 65%. The left ventricle has normal function. The left ventricle has no regional wall motion abnormalities. Definity contrast agent was given IV to delineate the left ventricular  endocardial borders. The left ventricular internal cavity size was normal in size. There is no left ventricular hypertrophy. Left ventricular diastolic parameters were normal. Right Ventricle: The right ventricular size is normal. No increase in right ventricular wall thickness. Right ventricular systolic function is normal. Left Atrium: Left atrial size was normal in size. Right Atrium: Right atrial size was normal in size. Pericardium: There is no evidence of pericardial effusion. Mitral Valve: The mitral valve is normal in structure. Mild mitral valve regurgitation. Tricuspid Valve: The tricuspid valve is normal in structure. Tricuspid valve regurgitation is not demonstrated.  Aortic Valve: The aortic valve was not well visualized. Aortic valve regurgitation is not visualized. Pulmonic Valve: The pulmonic valve was not well visualized. Pulmonic valve regurgitation is not visualized. Aorta: Aortic dilatation noted. There is mild dilatation of the ascending aorta, measuring 38 mm. Venous: The inferior vena cava was not well visualized. IAS/Shunts: No atrial level shunt detected by color flow Doppler.   Diastology LV e' medial:    9.79 cm/s LV E/e' medial:  6.4 LV e' lateral:   6.96 cm/s LV E/e' lateral: 9.0  RIGHT VENTRICLE RV Basal diam:  4.10 cm RV Mid diam:    3.00 cm RV S prime:     19.70 cm/s TAPSE (M-mode): 3.2 cm LEFT ATRIUM             Index        RIGHT ATRIUM  Index LA Vol (A2C):   82.5 ml 36.31 ml/m  RA Area:     17.70 cm LA Vol (A4C):   62.4 ml 27.46 ml/m  RA Volume:   47.80 ml  21.04 ml/m LA Biplane Vol: 73.4 ml 32.30 ml/m   AORTA Ao Root diam: 3.50 cm Ao Asc diam:  3.80 cm MITRAL VALVE MV Area (PHT): 3.65 cm MV Decel Time: 208 msec MV E velocity: 62.90 cm/s MV A velocity: 69.70 cm/s MV E/A ratio:  0.90 Debbe Odea MD Electronically signed by Debbe Odea MD Signature Date/Time: 07/16/2023/5:51:44 PM    Final     ASSESSMENT & PLAN:   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  Continue monitoring without imaging. Stable.    # Multiple colon polyps/Barrett's surveillance. [facility]- [facility]]-July 2023- Reviewed the EGD/pathology-no evidence of progression. Stable.    # Macrocytosis- no alcohol; wine- check B12.   # Elevated LFTs- G-1 likely sec to fatty liver; continue weight loss  # Obesity- [goal: 220]- slowly losing weight/weight watchers.  Congratulated patient.  # [id number]- ? [at home 130-80s]; question situational.  Monitor closely. Stable.   *ptpref  #  DISPOSITION: # follow up in 12 months MD/labs- cbc/cmp/ldh; b12--Dr.B    All questions were answered. The patient knows to call the clinic with any problems, questions or concerns.    Earna Coder, MD 07/27/2023 11:15 AM

## 2023-07-27 NOTE — Assessment & Plan Note (Signed)
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  Continue monitoring without imaging. Stable.    # Multiple colon polyps/Barrett's surveillance. [facility]- GI]-July 2023- Reviewed the EGD/pathology-no evidence of progression. Stable.    # Macrocytosis- no alcohol; wine- check B12.   # Elevated LFTs- G-1 likely sec to fatty liver; continue weight loss  # Obesity- [goal: 220]- slowly losing weight/weight watchers.  Congratulated patient.  # [id number]- ? [at home 130-80s]; question situational.  Monitor closely. Stable.   *ptpref  # DISPOSITION: # follow up in 12 months MD/labs- cbc/cmp/ldh; b12--Dr.B

## 2023-07-27 NOTE — Progress Notes (Signed)
C/o lt knee pain 3/10.  Echo done by Dr. Okey Dupre.

## 2023-08-26 NOTE — Patient Instructions (Signed)
Be Involved in Caring For Your Health:  Taking Medications When medications are taken as directed, they can greatly improve your health. But if they are not taken as prescribed, they may not work. In some cases, not taking them correctly can be harmful. To help ensure your treatment remains effective and safe, understand your medications and how to take them. Bring your medications to each visit for review by your provider.  Your lab results, notes, and after visit summary will be available on My Chart. We strongly encourage you to use this feature. If lab results are abnormal the clinic will contact you with the appropriate steps. If the clinic does not contact you assume the results are satisfactory. You can always view your results on My Chart. If you have questions regarding your health or results, please contact the clinic during office hours. You can also ask questions on My Chart.  We at [facility] are grateful that you chose [place] to provide your care. We strive to provide evidence-based and compassionate care and are always looking for feedback. If you get a survey from the clinic please complete this so we can hear your opinions.  Prediabetes Eating Plan Prediabetes is a condition that causes blood sugar (glucose) levels to be higher than normal. This increases the risk for developing type 2 diabetes (type 2 diabetes mellitus). Working with a health care provider or nutrition specialist (dietitian) to make diet and lifestyle changes can help prevent the onset of diabetes. These changes may help you: Control your blood glucose levels. Improve your cholesterol levels. Manage your blood pressure. What are tips for following this plan? Reading food labels Read food labels to check the amount of fat, salt (sodium), and sugar in prepackaged foods. Avoid foods that have: Saturated fats. Trans fats. Added sugars. Avoid foods that have more than 300 milligrams (mg) of sodium per  serving. Limit your sodium intake to less than 2,300 mg each day. Shopping Avoid buying pre-made and processed foods. Avoid buying drinks with added sugar. Cooking Cook with olive oil. Do not use butter, lard, or ghee. Bake, broil, grill, steam, or boil foods. Avoid frying. Meal planning  Work with your dietitian to create an eating plan that is right for you. This may include tracking how many calories you take in each day. Use a food diary, notebook, or mobile application to track what you eat at each meal. Consider following a Mediterranean diet. This includes: Eating several servings of fresh fruits and vegetables each day. Eating fish at least twice a week. Eating one serving each day of whole grains, beans, nuts, and seeds. Using olive oil instead of other fats. Limiting alcohol. Limiting red meat. Using nonfat or low-fat dairy products. Consider following a plant-based diet. This includes dietary choices that focus on eating mostly vegetables and fruit, grains, beans, nuts, and seeds. If you have high blood pressure, you may need to limit your sodium intake or follow a diet such as the DASH (Dietary Approaches to Stop Hypertension) eating plan. The DASH diet aims to lower high blood pressure. Lifestyle Set weight loss goals with help from your health care team. It is recommended that most people with prediabetes lose 7% of their body weight. Exercise for at least 30 minutes 5 or more days a week. Attend a support group or seek support from a mental health counselor. Take over-the-counter and prescription medicines only as told by your health care provider. What foods are recommended? Fruits Berries. Bananas. Apples. Oranges.  Grapes. Papaya. Mango. Pomegranate. Kiwi. Grapefruit. Cherries. Vegetables Lettuce. Spinach. Peas. Beets. Cauliflower. Cabbage. Broccoli. Carrots. Tomatoes. Squash. Eggplant. Herbs. Peppers. Onions. Cucumbers. Brussels sprouts. Grains Whole grains, such as  whole-wheat or whole-grain breads, crackers, cereals, and pasta. Unsweetened oatmeal. Bulgur. Barley. Quinoa. Brown rice. Corn or whole-wheat flour tortillas or taco shells. Meats and other proteins Seafood. Poultry without skin. Lean cuts of pork and beef. Tofu. Eggs. Nuts. Beans. Dairy Low-fat or fat-free dairy products, such as yogurt, cottage cheese, and cheese. Beverages Water. Tea. Coffee. Sugar-free or diet soda. Seltzer water. Low-fat or nonfat milk. Milk alternatives, such as soy or almond milk. Fats and oils Olive oil. Canola oil. Sunflower oil. Grapeseed oil. Avocado. Walnuts. Sweets and desserts Sugar-free or low-fat pudding. Sugar-free or low-fat ice cream and other frozen treats. Seasonings and condiments Herbs. Sodium-free spices. Mustard. Relish. Low-salt, low-sugar ketchup. Low-salt, low-sugar barbecue sauce. Low-fat or fat-free mayonnaise. The items listed above may not be a complete list of recommended foods and beverages. Contact a dietitian for more information. What foods are not recommended? Fruits Fruits canned with syrup. Vegetables Canned vegetables. Frozen vegetables with butter or cream sauce. Grains Refined white flour and flour products, such as bread, pasta, snack foods, and cereals. Meats and other proteins Fatty cuts of meat. Poultry with skin. Breaded or fried meat. Processed meats. Dairy Full-fat yogurt, cheese, or milk. Beverages Sweetened drinks, such as iced tea and soda. Fats and oils Butter. Lard. Ghee. Sweets and desserts Baked goods, such as cake, cupcakes, pastries, cookies, and cheesecake. Seasonings and condiments Spice mixes with added salt. Ketchup. Barbecue sauce. Mayonnaise. The items listed above may not be a complete list of foods and beverages that are not recommended. Contact a dietitian for more information. Where to find more information American Diabetes Association: www.diabetes.org Summary You may need to make diet and  lifestyle changes to help prevent the onset of diabetes. These changes can help you control blood sugar, improve cholesterol levels, and manage blood pressure. Set weight loss goals with help from your health care team. It is recommended that most people with prediabetes lose 7% of their body weight. Consider following a Mediterranean diet. This includes eating plenty of fresh fruits and vegetables, whole grains, beans, nuts, seeds, fish, and low-fat dairy, and using olive oil instead of other fats. This information is not intended to replace advice given to you by your health care provider. Make sure you discuss any questions you have with your health care provider. Document Revised: 02/26/2020 Document Reviewed: 02/26/2020 Elsevier Patient Education  2024 [employer].

## 2023-08-28 ENCOUNTER — Ambulatory Visit (INDEPENDENT_AMBULATORY_CARE_PROVIDER_SITE_OTHER): Payer: Medicare Other | Admitting: Nurse Practitioner

## 2023-08-28 ENCOUNTER — Encounter: Payer: Self-pay | Admitting: Nurse Practitioner

## 2023-08-28 VITALS — BP 150/86 | HR 75 | Temp 97.9°F | Ht 71.0 in | Wt 260.8 lb

## 2023-08-28 DIAGNOSIS — F339 Major depressive disorder, recurrent, unspecified: Secondary | ICD-10-CM

## 2023-08-28 DIAGNOSIS — I25118 Atherosclerotic heart disease of native coronary artery with other forms of angina pectoris: Secondary | ICD-10-CM

## 2023-08-28 DIAGNOSIS — Z79899 Other long term (current) drug therapy: Secondary | ICD-10-CM

## 2023-08-28 DIAGNOSIS — K2270 Barrett's esophagus without dysplasia: Secondary | ICD-10-CM

## 2023-08-28 DIAGNOSIS — F419 Anxiety disorder, unspecified: Secondary | ICD-10-CM

## 2023-08-28 DIAGNOSIS — R7303 Prediabetes: Secondary | ICD-10-CM | POA: Diagnosis not present

## 2023-08-28 DIAGNOSIS — E785 Hyperlipidemia, unspecified: Secondary | ICD-10-CM

## 2023-08-28 DIAGNOSIS — D692 Other nonthrombocytopenic purpura: Secondary | ICD-10-CM

## 2023-08-28 DIAGNOSIS — C884 Extranodal marginal zone b-cell lymphoma of mucosa-associated lymphoid tissue (malt-lymphoma) not having achieved remission: Secondary | ICD-10-CM

## 2023-08-28 DIAGNOSIS — I5032 Chronic diastolic (congestive) heart failure: Secondary | ICD-10-CM | POA: Diagnosis not present

## 2023-08-28 DIAGNOSIS — I10 Essential (primary) hypertension: Secondary | ICD-10-CM

## 2023-08-28 DIAGNOSIS — Z23 Encounter for immunization: Secondary | ICD-10-CM | POA: Diagnosis not present

## 2023-08-28 NOTE — Assessment & Plan Note (Signed)
Chronic, stable.  No recent NTG use.  Continue current medication regimen and collaboration with cardiology.  To alert provider immediately if CP presents or go immediately to ER.

## 2023-08-28 NOTE — Assessment & Plan Note (Signed)
Chronic, stable with no recent exacerbations.  Euvolemic.  Continue current medication regimen and collaboration with cardiology.  Recent notes reviewed.  Recommend: - Reminded to call for an overnight weight gain of >2 pounds or a weekly weight weight of >5 pounds - not adding salt to his food and has been reading food labels. Reviewed the importance of keeping daily sodium intake to 2000mg  daily  - Avoid Ibuprofen containing products

## 2023-08-28 NOTE — Assessment & Plan Note (Signed)
Noted past labs, recheck today.

## 2023-08-28 NOTE — Assessment & Plan Note (Signed)
BMI 39.53 with underlying cardiac issues and prediabetes -- has been on Ozempic now for 2 weeks and is noticing reduction in appetite -- maintain this.  No family history of thyroid cancer and no personal history of pancreatitis.  Educated on this medication at length.  Recommended eating smaller high protein, low fat meals more frequently and exercising 30 mins a day 5 times a week with a goal of 10-15lb weight loss in the next 3 months. Patient voiced their understanding and motivation to adhere to these recommendations.

## 2023-08-28 NOTE — Assessment & Plan Note (Signed)
Chronic, ongoing.  BP elevated in office today, but home levels are stable.  ?some white coat present. He is currently on Ozempic for weight loss and prediabetes, with goal of raising dose in new year and changing to [facility] if covered (has CAD) -- weight loss would offer benefit to BP.  At this time will continue current medication regimen and adjust as needed, sees cardiology, recent note reviewed.  If BP increases at home is to notify provider and then will consider change to Carvedilol or initiation of Spironolactone per cardiology recommendations. Labs: CMP.  He declines OSA testing.  Recommend he continue to monitor BP at home and document + focus on DASH diet and regular exercise.  Return in February for physical.

## 2023-08-28 NOTE — Assessment & Plan Note (Signed)
Chronic, stable.  Followed by GI as needed, continue this collaboration and PPI as ordered.  Risks of PPI use were discussed with patient including bone loss, C. Diff diarrhea, pneumonia, infections, CKD, electrolyte abnormalities.  Verbalizes understanding and chooses to continue the medication. Mag level annually.

## 2023-08-28 NOTE — Assessment & Plan Note (Signed)
Chronic, stable.  Denies SI/HI.  Currently has tapered self off benzo and is tolerating well.  Recommend relaxation and meditation techniques at home.  Return in 6 months.

## 2023-08-28 NOTE — Assessment & Plan Note (Signed)
Ongoing.  5.8% on recent labs, recheck today.  Is tolerating Ozempic well (has been on for 2 weeks), which is beneficial for his overall cardiac health.  Continue this regimen, refill sent in as needed.

## 2023-08-28 NOTE — Assessment & Plan Note (Signed)
Chronic, ongoing.  Continue collaboration with oncology, appreciate their ongoing input.  Recent note reviewed and labs.

## 2023-08-28 NOTE — Assessment & Plan Note (Signed)
Chronic, ongoing.  Continue current medication and adjust as needed.  Lipid panel obtained today.

## 2023-08-28 NOTE — Assessment & Plan Note (Signed)
Has not taken in 2 months, praised for this reduction in use. Refer to anxiety plan of care.

## 2023-08-28 NOTE — Assessment & Plan Note (Signed)
Chronic, ongoing.  Has not used benzo in 2 months. UDS due 01/27/24 if continues use of benzo and contract up to date. Will discontinue if remains off benzo.

## 2023-08-28 NOTE — Progress Notes (Signed)
BP (!) 150/86   Pulse 75   Temp 97.9 F (36.6 C) (Oral)   Ht 5\' 11"  (1.803 m)   Wt 260 lb 12.8 oz (118.3 kg)   SpO2 97%   BMI 36.37 kg/m    Subjective:    Patient ID: Jesse Norton, male    DOB: 10-04-1946, 77 y.o.   MRN: [id number]  HPI: Jesse Norton is a 77 y.o. male  Chief Complaint  Patient presents with   Hyperlipidemia   Hypertension   Obesity   Prediabetes   HYPERTENSION / HYPERLIPIDEMIA/HF Continues on Losartan, Metoprolol, Lasix, Amlodipine, and Plavix + Atorvastatin.  Sees cardiology for CAD, HFpEF and HTN, last saw on 06/04/23 -- no changes made, but they did note BP a little elevated and may consider changes if ongoing elevations.  No recent NTG use, has never used this. Satisfied with current treatment? yes Duration of hypertension: chronic BP monitoring frequency: every other day BP range: 135-138/78-85 range at home BP medication side effects: no Duration of hyperlipidemia: chronic Cholesterol medication side effects: no Cholesterol supplements: none Medication compliance: good compliance Aspirin: none Recent stressors: no Recurrent headaches: no Visual changes: no Palpitations: no Dyspnea: no Chest pain: no Lower extremity edema: no Dizzy/lightheaded: no   Impaired Fasting Glucose Currently using Ozempic and taking 0.5 MG weekly which he is tolerating.  He does notice a reduction in appetite.  Just started this two weeks ago.   HbA1C:  Lab Results  Component Value Date   HGBA1C 5.8 (H) 01/26/2023  Duration of elevated blood sugar: ongoing Polydipsia: no Polyuria: no Weight change: no Visual disturbance: no Glucose Monitoring: no    Accucheck frequency: Not Checking    Fasting glucose:     Post prandial:  Diabetic Education: Not Completed Family history of diabetes: yes   GERD Taking Protonix for Barrett's esophagus. GERD control status: stable Satisfied with current treatment? yes Heartburn frequency:  Medication side  effects: no  Medication compliance: stable Dysphagia: no Odynophagia:  no Hematemesis: no Blood in stool: no EGD: yes    B-CELL LYMPHOMA: Follows with oncology and last seen 07/27/23.  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  Went for colonoscopy and EGD -- 06/19/22.  Remains stable on review records.  Denies B symptoms.     ANXIETY/STRESS Has been trying to cut out Ativan, has not used in two months.  Was on 0.5-1 MG daily PRN for many years.  Pt is aware of risks of benzo medication use to include increased sedation, respiratory suppression, falls, dependence and cardiovascular events.  Pt would like to continue treatment as benefit determined to outweigh risk.  PDMP review last Ativan fill 07/26/22 for #30 tablets.  Did try Zoloft in past without benefit. Duration: stable Anxious mood: yes  Excessive worrying: no Irritability: no Sweating: no Nausea: no Palpitations:no Hyperventilation: no Panic attacks: yes Agoraphobia: no  Obscessions/compulsions: no Depressed mood: no    08/28/2023   11:29 AM 07/17/2023   11:28 AM 11/14/2022    8:57 AM 07/26/2022    9:49 AM 06/14/2022   11:09 AM  Depression screen PHQ 2/9  Decreased Interest 0 0 0 0 1  Down, Depressed, Hopeless 0 0 0 0   PHQ - 2 Score 0 0 0 0 1  Altered sleeping 0 0 0 0 1  Tired, decreased energy 1 0 1 1 1   Change in appetite 1 0 0 0 1  Feeling bad or failure about  yourself  0 0 0 0 0  Trouble concentrating 0 0 0 0 0  Moving slowly or fidgety/restless 0 0 0 0 0  Suicidal thoughts 0 0 0 0 0  PHQ-9 Score 2 0 1 1 4   Difficult doing work/chores Not difficult at all Not difficult at all Not difficult at all Not difficult at all Not difficult at all      08/28/2023   11:30 AM 11/14/2022    8:57 AM 07/26/2022    9:49 AM 06/14/2022   11:10 AM  GAD 7 : Generalized Anxiety Score  Nervous, Anxious, on Edge 1 1 1 1   Control/stop worrying 0 0 1 1  Worry too much - different  things 0 0 1 1  Trouble relaxing 0 1 1 0  Restless 0 0 0 1  Easily annoyed or irritable 1 0 0 0  Afraid - awful might happen 0 0 0 1  Total GAD 7 Score 2 2 4 5   Anxiety Difficulty Not difficult at all Not difficult at all Not difficult at all Not difficult at all    Relevant past medical, surgical, family and social history reviewed and updated as indicated. Interim medical history since our last visit reviewed. Allergies and medications reviewed and updated.  Review of Systems  Constitutional:  Negative for activity change, diaphoresis, fatigue and fever.  Respiratory:  Negative for cough, chest tightness, shortness of breath and wheezing.   Cardiovascular:  Negative for chest pain, palpitations and leg swelling.  Gastrointestinal: Negative.   Neurological: Negative.   Psychiatric/Behavioral:  Negative for decreased concentration, self-injury, sleep disturbance and suicidal ideas. The patient is not nervous/anxious.    Per HPI unless specifically indicated above     Objective:    BP (!) 150/86   Pulse 75   Temp 97.9 F (36.6 C) (Oral)   Ht 5\' 11"  (1.803 m)   Wt 260 lb 12.8 oz (118.3 kg)   SpO2 97%   BMI 36.37 kg/m   Wt Readings from Last 3 Encounters:  08/28/23 260 lb 12.8 oz (118.3 kg)  07/27/23 260 lb 12.8 oz (118.3 kg)  07/17/23 245 lb (111.1 kg)    Physical Exam Vitals and nursing note reviewed.  Constitutional:      General: He is awake. He is not in acute distress.    Appearance: He is well-developed and well-groomed. He is morbidly obese. He is not ill-appearing.  HENT:     Head: Normocephalic and atraumatic.     Right Ear: Hearing normal. No drainage.     Left Ear: Hearing normal. No drainage.  Eyes:     General: Lids are normal.        Right eye: No discharge.        Left eye: No discharge.     Conjunctiva/sclera: Conjunctivae normal.     Pupils: Pupils are equal, round, and reactive to light.  Neck:     Vascular: No carotid bruit.     Trachea:  Trachea normal.  Cardiovascular:     Rate and Rhythm: Normal rate and regular rhythm.     Heart sounds: Normal heart sounds, S1 normal and S2 normal. No murmur heard.    No gallop.  Pulmonary:     Effort: Pulmonary effort is normal. No accessory muscle usage or respiratory distress.     Breath sounds: Normal breath sounds.  Abdominal:     General: Bowel sounds are normal.     Palpations: Abdomen is soft.  Musculoskeletal:  General: Normal range of motion.     Cervical back: Normal range of motion and neck supple.     Right lower leg: No edema.     Left lower leg: No edema.  Skin:    General: Skin is warm and dry.     Capillary Refill: Capillary refill takes less than 2 seconds.     Comments: Scattered pale purple bruises bilateral upper extremities noted.  Neurological:     Mental Status: He is alert and oriented to person, place, and time.  Psychiatric:        Attention and Perception: Attention normal.        Mood and Affect: Mood normal.        Speech: Speech normal.        Behavior: Behavior normal. Behavior is cooperative.        Thought Content: Thought content normal.    Results for orders placed or performed in visit on 07/27/23  Lactate dehydrogenase  Result Value Ref Range   LDH 178 98 - 192 U/L  CMP (Cancer Center only)  Result Value Ref Range   Sodium 138 135 - 145 mmol/L   Potassium 3.7 3.5 - 5.1 mmol/L   Chloride 100 98 - 111 mmol/L   CO2 27 22 - 32 mmol/L   Glucose, Bld 105 (H) 70 - 99 mg/dL   BUN 16 8 - 23 mg/dL   Creatinine 4.09 8.11 - 1.24 mg/dL   Calcium 9.6 8.9 - 91.4 mg/dL   Total Protein 7.5 6.5 - 8.1 g/dL   Albumin 4.3 3.5 - 5.0 g/dL   AST 50 (H) 15 - 41 U/L   ALT 46 (H) 0 - 44 U/L   Alkaline Phosphatase 59 38 - 126 U/L   Total Bilirubin 0.9 0.3 - 1.2 mg/dL   GFR, Estimated >78 >29 mL/min   Anion gap 11 5 - 15  CBC with Differential (Cancer Center Only)  Result Value Ref Range   WBC Count 7.3 4.0 - 10.5 K/uL   RBC 3.88 (L) 4.22 -  5.81 MIL/uL   Hemoglobin 13.9 13.0 - 17.0 g/dL   HCT 56.2 13.0 - 86.5 %   MCV 104.9 (H) 80.0 - 100.0 fL   MCH 35.8 (H) 26.0 - 34.0 pg   MCHC 34.2 30.0 - 36.0 g/dL   RDW 78.4 69.6 - 29.5 %   Platelet Count 298 150 - 400 K/uL   nRBC 0.3 (H) 0.0 - 0.2 %   Neutrophils Relative % 40 %   Neutro Abs 3.0 1.7 - 7.7 K/uL   Lymphocytes Relative 37 %   Lymphs Abs 2.7 0.7 - 4.0 K/uL   Monocytes Relative 16 %   Monocytes Absolute 1.1 (H) 0.1 - 1.0 K/uL   Eosinophils Relative 5 %   Eosinophils Absolute 0.3 0.0 - 0.5 K/uL   Basophils Relative 2 %   Basophils Absolute 0.1 0.0 - 0.1 K/uL   Immature Granulocytes 0 %   Abs Immature Granulocytes 0.03 0.00 - 0.07 K/uL      Assessment & Plan:   Problem List Items Addressed This Visit       Cardiovascular and Mediastinum   Chronic heart failure with preserved ejection fraction (HCC) - Primary    Chronic, stable with no recent exacerbations.  Euvolemic.  Continue current medication regimen and collaboration with cardiology.  Recent notes reviewed.  Recommend: - Reminded to call for an overnight weight gain of >2 pounds or a weekly weight weight of >5 pounds -  not adding salt to his food and has been reading food labels. Reviewed the importance of keeping daily sodium intake to 2000mg  daily  - Avoid Ibuprofen containing products      Relevant Orders   Lipid Panel w/o Chol/HDL Ratio   Coronary artery disease of native artery of native heart with stable angina pectoris (HCC)    Chronic, stable.  No recent NTG use.  Continue current medication regimen and collaboration with cardiology.  To alert provider immediately if CP presents or go immediately to ER.      Relevant Orders   Comprehensive metabolic panel   Lipid Panel w/o Chol/HDL Ratio   Essential hypertension    Chronic, ongoing.  BP elevated in office today, but home levels are stable.  ?some white coat present. He is currently on Ozempic for weight loss and prediabetes, with goal of raising  dose in new year and changing to [facility] if covered (has CAD) -- weight loss would offer benefit to BP.  At this time will continue current medication regimen and adjust as needed, sees cardiology, recent note reviewed.  If BP increases at home is to notify provider and then will consider change to Carvedilol or initiation of Spironolactone per cardiology recommendations. Labs: CMP.  He declines OSA testing.  Recommend he continue to monitor BP at home and document + focus on DASH diet and regular exercise.  Return in February for physical.        Digestive   Barrett's esophagus    Chronic, stable.  Followed by GI as needed, continue this collaboration and PPI as ordered.  Risks of PPI use were discussed with patient including bone loss, C. Diff diarrhea, pneumonia, infections, CKD, electrolyte abnormalities.  Verbalizes understanding and chooses to continue the medication. Mag level annually.        Relevant Orders   Magnesium     Other   Chronic anxiety    Chronic, ongoing.  Has not used benzo in 2 months. UDS due 01/27/24 if continues use of benzo and contract up to date. Will discontinue if remains off benzo.      Depression, recurrent (HCC)    Chronic, stable.  Denies SI/HI.  Currently has tapered self off benzo and is tolerating well.  Recommend relaxation and meditation techniques at home.  Return in 6 months.      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  Continue collaboration with oncology, appreciate their ongoing input.  Recent note reviewed and labs.      Hyperlipidemia LDL goal <70    Chronic, ongoing.  Continue current medication and adjust as needed.  Lipid panel obtained today.      Relevant Orders   Comprehensive metabolic panel   Lipid Panel w/o Chol/HDL Ratio   Hypomagnesemia    Noted past labs, recheck today.      Relevant Orders   Magnesium   Morbid obesity (HCC)    BMI 39.53  with underlying cardiac issues and prediabetes -- has been on Ozempic now for 2 weeks and is noticing reduction in appetite -- maintain this.  No family history of thyroid cancer and no personal history of pancreatitis.  Educated on this medication at length.  Recommended eating smaller high protein, low fat meals more frequently and exercising 30 mins a day 5 times a week with a goal of 10-15lb weight loss in the next 3 months. Patient voiced their understanding and motivation to adhere to these  recommendations.         Prediabetes    Ongoing.  5.8% on recent labs, recheck today.  Is tolerating Ozempic well (has been on for 2 weeks), which is beneficial for his overall cardiac health.  Continue this regimen, refill sent in as needed.      Relevant Orders   HgB A1c   Other Visit Diagnoses     Flu vaccine need       Flu vaccine provided today in office, educated on this.        Follow up plan: Return in about 6 months (around 02/25/2024) for Annual physical after 01/27/24.

## 2023-08-29 NOTE — Progress Notes (Signed)
Contacted via MyChart   Good afternoon Juandaniel, your labs have returned: - Kidney function, creatinine and eGFR, remains normal.  Liver function, AST and ALT, continues to show mild elevation in levels which we will monitor closely. - Magnesium remains low, I would recommend taking your magnesium twice a day for now - 400 MG. - Cholesterol levels look great. - A1c remains in prediabetic range at 6%.  We will continue to monitor this.  Any questions on these? Keep being amazing!!  Thank you for allowing me to participate in your care.  I appreciate you. Kindest regards, Jenavee Laguardia

## 2023-10-04 ENCOUNTER — Other Ambulatory Visit: Payer: Self-pay | Admitting: Nurse Practitioner

## 2023-10-04 DIAGNOSIS — I10 Essential (primary) hypertension: Secondary | ICD-10-CM

## 2023-10-05 NOTE — Telephone Encounter (Signed)
Requested Prescriptions  Refused Prescriptions Disp Refills   clopidogrel (PLAVIX) 75 MG tablet [Pharmacy Med Name: CLOPIDOGREL 75MG  TABLETS] 90 tablet 4    Sig: TAKE 1 TABLET(75 MG) BY MOUTH DAILY WITH BREAKFAST     Hematology: Antiplatelets - clopidogrel Passed - 10/04/2023  3:31 PM      Passed - HCT in normal range and within 180 days    HCT  Date Value Ref Range Status  07/27/2023 40.7 39.0 - 52.0 % Final   Hematocrit  Date Value Ref Range Status  01/26/2023 38.9 37.5 - 51.0 % Final         Passed - HGB in normal range and within 180 days    Hemoglobin  Date Value Ref Range Status  07/27/2023 13.9 13.0 - 17.0 g/dL Final  16/09/9603 54.0 13.0 - 17.7 g/dL Final         Passed - PLT in normal range and within 180 days    Platelets  Date Value Ref Range Status  01/26/2023 322 150 - 450 x10E3/uL Final   Platelet Count  Date Value Ref Range Status  07/27/2023 298 150 - 400 K/uL Final         Passed - Cr in normal range and within 360 days    Creatinine  Date Value Ref Range Status  07/27/2023 0.91 0.61 - 1.24 mg/dL Final  98/10/9146 829.5 20.0 - 300.0 mg/dL Final  62/13/0865 7.84 0.60 - 1.30 mg/dL Final   Creatinine, Ser  Date Value Ref Range Status  08/28/2023 0.89 0.76 - 1.27 mg/dL Final         Passed - Valid encounter within last 6 months    Recent Outpatient Visits           1 month ago Chronic heart failure with preserved ejection fraction (HCC)   [place] [facility] [place], [place] T, NP   8 months ago Chronic heart failure with preserved ejection fraction (HCC)   [facility] [place], Corrie Dandy T, NP   10 months ago Acute cough   [place] [facility] Gabriel Cirri, NP   10 months ago Acute cough   [place] [facility] Gabriel Cirri, NP   1 year ago Depression, recurrent (HCC)   [place] [facility] [place], [place] T, NP       Future Appointments              In 4 months Cannady, Smyer T, NP [place] [facility]             amLODipine (NORVASC) 10 MG tablet [Pharmacy Med Name: AMLODIPINE BESYLATE 10MG  TABLETS] 90 tablet 4    Sig: TAKE 1 TABLET(10 MG) BY MOUTH DAILY     Cardiovascular: Calcium Channel Blockers 2 Failed - 10/04/2023  3:31 PM      Failed - Last BP in normal range    BP Readings from Last 1 Encounters:  08/28/23 (!) 150/86         Passed - Last Heart Rate in normal range    Pulse Readings from Last 1 Encounters:  08/28/23 75         Passed - Valid encounter within last 6 months    Recent Outpatient Visits           1 month ago Chronic heart failure with preserved ejection fraction (HCC)   [place] [facility] [place], [place] T, NP   8 months ago Chronic heart  failure with preserved ejection fraction (HCC)   [place] [facility] [place], Corrie Dandy T, NP   10 months ago Acute cough   [place] [facility] Gabriel Cirri, NP   10 months ago Acute cough   [place] [facility] Gabriel Cirri, NP   1 year ago Depression, recurrent [facility])   [place] [facility] Marjie Skiff, NP       Future Appointments             In 4 months Cannady, Dorie Rank, NP [place] [facility]

## 2023-11-14 ENCOUNTER — Telehealth: Payer: Self-pay | Admitting: Nurse Practitioner

## 2023-11-14 ENCOUNTER — Ambulatory Visit (INDEPENDENT_AMBULATORY_CARE_PROVIDER_SITE_OTHER): Payer: Medicare Other

## 2023-11-14 DIAGNOSIS — Z23 Encounter for immunization: Secondary | ICD-10-CM

## 2023-11-14 MED ORDER — METOPROLOL TARTRATE 25 MG PO TABS: 25.0000 mg | ORAL_TABLET | Freq: Two times a day (BID) | ORAL | 4 refills | Status: DC

## 2023-11-14 NOTE — Telephone Encounter (Signed)
Prescription Request  11/14/2023  LOV: 08/28/2023  What is the name of the medication or equipment? metoprolol tartrate   Have you contacted your pharmacy to request a refill? Yes   Which pharmacy would you like this sent to?  [id number] [place], [place] - [address] [facility] [address] [place] [address] Phone: [phone] Fax: [phone]    Patient notified that their request is being sent to the clinical staff for review and that they should receive a response within 2 business days.   Please advise at Mobile [phone] (mobile)

## 2023-11-28 DIAGNOSIS — Z961 Presence of intraocular lens: Secondary | ICD-10-CM | POA: Diagnosis not present

## 2023-11-28 DIAGNOSIS — H04123 Dry eye syndrome of bilateral lacrimal glands: Secondary | ICD-10-CM | POA: Diagnosis not present

## 2023-12-03 ENCOUNTER — Telehealth: Payer: Self-pay | Admitting: Internal Medicine

## 2023-12-03 ENCOUNTER — Other Ambulatory Visit: Payer: Self-pay | Admitting: Emergency Medicine

## 2023-12-03 NOTE — Telephone Encounter (Signed)
Wayna Chalet calling, she said, they need to confirm the diagnosis code for a heart monitor that was ordered 11/17/22 by Nicolasa Ducking for this pt.

## 2024-01-14 ENCOUNTER — Telehealth: Payer: Self-pay | Admitting: *Deleted

## 2024-01-14 NOTE — Telephone Encounter (Signed)
Over the weekend this patient has found a knot at the armpit on the left side and wanted to see if he needed to be seen about this.

## 2024-01-18 ENCOUNTER — Inpatient Hospital Stay: Payer: Medicare Other

## 2024-01-18 ENCOUNTER — Encounter: Payer: Self-pay | Admitting: Nurse Practitioner

## 2024-01-18 ENCOUNTER — Inpatient Hospital Stay: Payer: Medicare Other | Attending: Nurse Practitioner | Admitting: Nurse Practitioner

## 2024-01-18 VITALS — BP 137/79 | HR 92 | Temp 99.4°F | Wt 244.0 lb

## 2024-01-18 DIAGNOSIS — R7989 Other specified abnormal findings of blood chemistry: Secondary | ICD-10-CM | POA: Insufficient documentation

## 2024-01-18 DIAGNOSIS — Z860100 Personal history of colon polyps, unspecified: Secondary | ICD-10-CM | POA: Diagnosis not present

## 2024-01-18 DIAGNOSIS — R591 Generalized enlarged lymph nodes: Secondary | ICD-10-CM | POA: Insufficient documentation

## 2024-01-18 DIAGNOSIS — D7589 Other specified diseases of blood and blood-forming organs: Secondary | ICD-10-CM | POA: Diagnosis not present

## 2024-01-18 DIAGNOSIS — Q8901 Asplenia (congenital): Secondary | ICD-10-CM | POA: Insufficient documentation

## 2024-01-18 DIAGNOSIS — C884 Extranodal marginal zone b-cell lymphoma of mucosa-associated lymphoid tissue (malt-lymphoma) not having achieved remission: Secondary | ICD-10-CM | POA: Diagnosis not present

## 2024-01-18 DIAGNOSIS — I10 Essential (primary) hypertension: Secondary | ICD-10-CM | POA: Insufficient documentation

## 2024-01-18 DIAGNOSIS — Z8572 Personal history of non-Hodgkin lymphomas: Secondary | ICD-10-CM | POA: Diagnosis not present

## 2024-01-18 DIAGNOSIS — E669 Obesity, unspecified: Secondary | ICD-10-CM | POA: Diagnosis not present

## 2024-01-18 LAB — COMPREHENSIVE METABOLIC PANEL
ALT: 44 U/L (ref 0–44)
AST: 65 U/L — ABNORMAL HIGH (ref 15–41)
Albumin: 4.1 g/dL (ref 3.5–5.0)
Alkaline Phosphatase: 65 U/L (ref 38–126)
Anion gap: 13 (ref 5–15)
BUN: 24 mg/dL — ABNORMAL HIGH (ref 8–23)
CO2: 26 mmol/L (ref 22–32)
Calcium: 8.8 mg/dL — ABNORMAL LOW (ref 8.9–10.3)
Chloride: 93 mmol/L — ABNORMAL LOW (ref 98–111)
Creatinine, Ser: 1.17 mg/dL (ref 0.61–1.24)
GFR, Estimated: >60 mL/min (ref >60)
Glucose, Bld: 105 mg/dL — ABNORMAL HIGH (ref 70–99)
Potassium: 4.1 mmol/L (ref 3.5–5.1)
Sodium: 132 mmol/L — ABNORMAL LOW (ref 135–145)
Total Bilirubin: 1.8 mg/dL — ABNORMAL HIGH (ref 0.0–1.2)
Total Protein: 7.4 g/dL (ref 6.5–8.1)

## 2024-01-18 LAB — CBC WITH DIFFERENTIAL/PLATELET
Abs Immature Granulocytes: 0.03 10*3/uL (ref 0.00–0.07)
Basophils Absolute: 0.1 10*3/uL (ref 0.0–0.1)
Basophils Relative: 1 %
Eosinophils Absolute: 0.1 10*3/uL (ref 0.0–0.5)
Eosinophils Relative: 2 %
HCT: 39.2 % (ref 39.0–52.0)
Hemoglobin: 13.5 g/dL (ref 13.0–17.0)
Immature Granulocytes: 0 %
Lymphocytes Relative: 31 %
Lymphs Abs: 2.3 10*3/uL (ref 0.7–4.0)
MCH: 35.2 pg — ABNORMAL HIGH (ref 26.0–34.0)
MCHC: 34.4 g/dL (ref 30.0–36.0)
MCV: 102.3 fL — ABNORMAL HIGH (ref 80.0–100.0)
Monocytes Absolute: 1.2 10*3/uL — ABNORMAL HIGH (ref 0.1–1.0)
Monocytes Relative: 15 %
Neutro Abs: 3.8 10*3/uL (ref 1.7–7.7)
Neutrophils Relative %: 51 %
Platelets: 311 10*3/uL (ref 150–400)
RBC: 3.83 MIL/uL — ABNORMAL LOW (ref 4.22–5.81)
RDW: 12.6 % (ref 11.5–15.5)
WBC: 7.5 10*3/uL (ref 4.0–10.5)
nRBC: 0 % (ref 0.0–0.2)

## 2024-01-18 LAB — LACTATE DEHYDROGENASE: LDH: 579 U/L — ABNORMAL HIGH (ref 98–192)

## 2024-01-18 NOTE — Progress Notes (Signed)
 Patient concerned about his right arm has felt lump on left breast.

## 2024-01-18 NOTE — Progress Notes (Addendum)
 [facility] CONSULT NOTE  Patient Care Team: Valerio Melanie DASEN, NP as PCP - General (Nurse Practitioner) End, Lonni, MD as PCP - Cardiology (Cardiology) Minor, Glynn RAMAN, RN (Inactive) as [facility] Care Management Rennie, Cindy SAUNDERS, MD as Consulting Physician (Oncology) Onita Elspeth Sharper, DO as Consulting Physician (Gastroenterology)  CHIEF COMPLAINTS/PURPOSE OF CONSULTATION: Lymphoma  Oncology History Overview Note  # NOV 2019- MALT [II opinion at [facility] path]; s/p sigmoid colon polyp resection [Dr.Skulskie; incidental]; PET scan -NED  # Barrett's esophagus- 2018;#Colonic polyps surveillance/Dr. 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  Ambulating dependently.  Jesse Norton 78 y.o.  male with above history of MALT lymphoma of the colon/polyp s/p resection who returns to clinic for concerns of new mass under his arm. He has chronic left arm changes and prior cyst of left bicep. He noticed new nodularity of left arm a few days ago and has persisted. Hasn't changed shape. Unsure of any other masses or lumps. Denies nausea or vomiting. Denies night sweats. He remains active. Appetite is normal. His weight continues to downtrend intentionally.   Review of Systems  Constitutional:  Negative for chills, diaphoresis, fever, malaise/fatigue and weight loss.  HENT:  Negative for nosebleeds and sore throat.   Eyes:  Negative for double vision.  Respiratory:  Negative for cough, hemoptysis, sputum production, shortness of  breath and wheezing.   Cardiovascular:  Negative for chest pain, palpitations, orthopnea and leg swelling.  Gastrointestinal:  Negative for abdominal pain, blood in stool, constipation, diarrhea, heartburn, melena, nausea and vomiting.  Genitourinary:  Negative for dysuria, frequency and urgency.  Musculoskeletal:  Negative for back pain and joint pain.  Skin:  Negative for itching and rash.  Neurological:  Negative for dizziness, tingling, focal weakness, weakness and headaches.  Endo/Heme/Allergies:  Does not bruise/bleed easily.  Psychiatric/Behavioral:  Negative for depression. The patient is not nervous/anxious and does not have insomnia.      MEDICAL HISTORY:  Past Medical History:  Diagnosis Date   (HFpEF) heart failure with preserved ejection fraction (HCC)    a. 01/2019 Cath: EF 55-60% w/ elevated LVEDP.   Anxiety    Barrett's esophagus    CAD (coronary artery disease)    a. 10/2018 ETT: 1mm horizontal inflat ST depression, freq PVC's->Intermediate; b. 12/2018 Cardiac CTA: LM nl, LAD FFRct 0.6 in mLAD, LCX FFRct 0.99p, 0.75m, 0.77d, RCA no signif dzs, FFRct 0.89d->Rec cath; c. 01/2019 PCI: LM nl, LAD 40p, 65m, D2 70, LCX 70p (2.75x15 Resolute Onyx DES), RCA min irregs. EF 55-65%. EDP 25-7mmHg; d. 01/2019 PCI: LAD 80-90 (atherectomy & 3x15 Resolute Onyx DES).   Colon polyps    Duodenal adenoma    Gastritis    GERD (gastroesophageal reflux disease)    Gout    H. pylori infection    H/O urethral stricture    Hemorrhoids    Hiatal hernia    Hypertension    Stomach ulcer     SURGICAL HISTORY: Past Surgical History:  Procedure Laterality Date  CARDIAC CATHETERIZATION     CATARACT EXTRACTION W/PHACO Left 03/07/2022   Procedure: CATARACT EXTRACTION PHACO AND INTRAOCULAR LENS PLACEMENT (IOC) LEFT VIVITY TORIC LENS 10.41 00:55.7;  Surgeon: Jaye Fallow, MD;  Location: [facility];  Service: Ophthalmology;  Laterality: Left;   CATARACT EXTRACTION W/PHACO Right  03/28/2022   Procedure: CATARACT EXTRACTION PHACO AND INTRAOCULAR LENS PLACEMENT (IOC) RIGHT VIVITY LENS 6.68 00:50.0;  Surgeon: Jaye Fallow, MD;  Location: [facility];  Service: Ophthalmology;  Laterality: Right;   COLONOSCOPY     COLONOSCOPY N/A 06/19/2022   Procedure: COLONOSCOPY;  Surgeon: Onita Elspeth Sharper, DO;  [facility];  Service: Gastroenterology;  Laterality: N/A;   COLONOSCOPY WITH PROPOFOL  N/A 08/20/2018   Procedure: COLONOSCOPY WITH PROPOFOL ;  Surgeon: Gaylyn Gladis PENNER, MD;  Location: [facility] ENDOSCOPY;  Service: Endoscopy;  Laterality: N/A;   COLONOSCOPY WITH PROPOFOL  N/A 12/19/2018   Procedure: COLONOSCOPY WITH PROPOFOL ;  Surgeon: Gaylyn Gladis PENNER, MD;  Location: [facility] ENDOSCOPY;  Service: Endoscopy;  Laterality: N/A;   CORONARY ANGIOPLASTY     CORONARY ATHERECTOMY N/A 02/03/2019   Procedure: CORONARY ATHERECTOMY;  Surgeon: Mady Bruckner, MD;  [facility];  Service: Cardiovascular;  Laterality: N/A;   CORONARY STENT INTERVENTION N/A 01/21/2019   Procedure: CORONARY STENT INTERVENTION;  Surgeon: Mady Bruckner, MD;  Location: [facility] INVASIVE CV LAB;  Service: Cardiovascular;  Laterality: N/A;   CORONARY STENT INTERVENTION N/A 02/03/2019   Procedure: CORONARY STENT INTERVENTION;  Surgeon: Mady Bruckner, MD;  Location: [facility];  Service: Cardiovascular;  Laterality: N/A;   CORONARY ULTRASOUND/IVUS N/A 02/03/2019   Procedure: Intravascular Ultrasound/IVUS;  Surgeon: Mady Bruckner, MD;  Location: [facility];  Service: Cardiovascular;  Laterality: N/A;   CYSTOSCOPY WITH URETHRAL DILATATION N/A 08/16/2018   Procedure: CYSTOSCOPY WITH URETHRAL DILATATION;  Surgeon: Francisca Redell BROCKS, MD;  Location: [facility] ORS;  Service: Urology;  Laterality: N/A;   ESOPHAGOGASTRODUODENOSCOPY N/A 01/29/2017   Procedure: ESOPHAGOGASTRODUODENOSCOPY (EGD);  Surgeon: Gladis PENNER Gaylyn, MD;  [facility] END[facility]COPY;  Service: Endoscopy;   Laterality: N/A;   ESOPHAGOGASTRODUODENOSCOPY N/A 06/19/2022   Procedure: ESOPHAGOGASTRODUODENOSCOPY (EGD);  Surgeon: Onita Elspeth Sharper, DO;  [facility];  Service: Gastroenterology;  Laterality: N/A;   ESOPHAGOGASTRODUODENOSCOPY (EGD) WITH PROPOFOL  N/A 07/10/2016   Procedure: ESOPHAGOGASTRODUODENOSCOPY (EGD) WITH PROPOFOL ;  Surgeon: Gladis PENNER Gaylyn, MD;  Location: [facility] ENDOSCOPY;  Service: Endoscopy;  Laterality: N/A;   ESOPHAGOGASTRODUODENOSCOPY (EGD) WITH PROPOFOL  N/A 12/19/2018   Procedure: ESOPHAGOGASTRODUODENOSCOPY (EGD) WITH PROPOFOL ;  Surgeon: Gaylyn Gladis PENNER, MD;  Location: [facility] ENDOSCOPY;  Service: Endoscopy;  Laterality: N/A;   EYE SURGERY     gastritis     h.pylori     left elbow repair Left    LEFT HEART CATH AND CORONARY ANGIOGRAPHY Left 01/21/2019   Procedure: LEFT HEART CATH AND CORONARY ANGIOGRAPHY;  Surgeon: Mady Bruckner, MD;  Location: [facility] INVASIVE CV LAB;  Service: Cardiovascular;  Laterality: Left;   SPLENECTOMY  1957   TONSILLECTOMY      SOCIAL HISTORY: Social History   Socioeconomic History   Marital status: Married    Spouse name: Clarita   Number of children: 2   Years of education: Not on file   Highest education level: Master's degree (e.g., MA, MS, MEng, MEd, MSW, MBA)  Occupational History   Occupation: retired     Comment: psychologist, occupational  Tobacco Use   Smoking status: Never   Smokeless tobacco: Never  Vaping Use   Vaping status: Never Used  Substance and Sexual Activity  Alcohol use: Yes    Comment: has a drink 2 or 3 times per week   Drug use: No   Sexual activity: Yes  Other Topics Concern   Not on file  Social History Narrative   Married, 2 sons, 1 in [place], [place] and 1 in [employer], stationary bike      No smoking.  Occasional alcohol.  Lives at home.  Retired psychologist, occupational.   Social Drivers of Corporate Investment Banker Strain: Low Risk  (07/17/2023)   Overall Financial Resource Strain (CARDIA)    Difficulty of  Paying Living Expenses: Not hard at all  Food Insecurity: No Food Insecurity (07/17/2023)   Hunger Vital Sign    Worried About Running Out of Food in the Last Year: Never true    Ran Out of Food in the Last Year: Never true  Transportation Needs: No Transportation Needs (07/17/2023)   PRAPARE - Administrator, Civil Service (Medical): No    Lack of Transportation (Non-Medical): No  Physical Activity: Insufficiently Active (07/17/2023)   Exercise Vital Sign    Days of Exercise per Week: 4 days    Minutes of Exercise per Session: 30 min  Stress: No Stress Concern Present (07/17/2023)   [employer] of Occupational Health - Occupational Stress Questionnaire    Feeling of Stress : Only a little  Social Connections: Moderately Integrated (07/17/2023)   Social Connection and Isolation Panel [NHANES]    Frequency of Communication with Friends and Family: Three times a week    Frequency of Social Gatherings with Friends and Family: Three times a week    Attends Religious Services: More than 4 times per year    Active Member of Clubs or Organizations: No    Attends Banker Meetings: Never    Marital Status: Married  Catering Manager Violence: Not At Risk (07/17/2023)   Humiliation, Afraid, Rape, and Kick questionnaire    Fear of Current or Ex-Partner: No    Emotionally Abused: No    Physically Abused: No    Sexually Abused: No    FAMILY HISTORY: Family History  Adopted: Yes  Problem Relation Age of Onset   Heart attack Mother 56   Pancreatic cancer Father        died in WWII - pt adopted   Hypertension Sister    Hyperlipidemia Sister    Hypertension Brother    Hyperlipidemia Brother    Prostate cancer Neg Hx    Kidney cancer Neg Hx    Bladder Cancer Neg Hx     ALLERGIES:  has no known allergies.  MEDICATIONS:  Current Outpatient Medications  Medication Sig Dispense Refill   allopurinol  (ZYLOPRIM ) 300 MG tablet Take 1 tablet (300 mg total) by mouth 3  (three) times a week. 90 tablet 4   amLODipine  (NORVASC ) 10 MG tablet TAKE 1 TABLET(10 MG) BY MOUTH DAILY 90 tablet 4   atorvastatin  (LIPITOR) 40 MG tablet TAKE 1 TABLET(40 MG) BY MOUTH DAILY 90 tablet 4   clobetasol ointment (TEMOVATE) 0.05 % Apply topically as needed.     clopidogrel  (PLAVIX ) 75 MG tablet TAKE 1 TABLET(75 MG) BY MOUTH DAILY WITH BREAKFAST 90 tablet 4   furosemide  (LASIX ) 20 MG tablet Take 1 tablet (20 mg total) by mouth daily. 90 tablet 4   losartan  (COZAAR ) 100 MG tablet TAKE 1 TABLET(100 MG) BY MOUTH DAILY 90 tablet 4   metoprolol  tartrate (LOPRESSOR ) 25 MG tablet Take 1 tablet (25  mg total) by mouth 2 (two) times daily. 180 tablet 4   nitroGLYCERIN  (NITROSTAT ) 0.4 MG SL tablet Place 1 tablet (0.4 mg total) under the tongue every 5 (five) minutes as needed for chest pain. Maximum of 3 doses. 25 tablet 3   OVER THE COUNTER MEDICATION AGI (athletic greens) powder form by mouth once daily     pantoprazole  (PROTONIX ) 20 MG tablet TAKE 1 TABLET(20 MG) BY MOUTH TWICE DAILY 180 tablet 4   Semaglutide ,0.25 or 0.5MG /DOS, (OZEMPIC , 0.25 OR 0.5 MG/DOSE,) 2 MG/1.5ML SOPN Inject 0.5 MG in the skin weekly for prediabetes. 1.5 mL 5   No current facility-administered medications for this visit.    PHYSICAL EXAMINATION: ECOG PERFORMANCE STATUS: 1 - Symptomatic but completely ambulatory  Vitals:   01/18/24 0942  BP: 137/79  Pulse: 92  Temp: 99.4 F (37.4 C)  SpO2: 95%   Filed Weights   01/18/24 0942  Weight: 244 lb (110.7 kg)   Physical Exam Vitals reviewed.  Constitutional:      Appearance: He is not ill-appearing.  HENT:     Head: Normocephalic and atraumatic.     Mouth/Throat:     Pharynx: No oropharyngeal exudate or posterior oropharyngeal erythema.  Cardiovascular:     Rate and Rhythm: Normal rate and regular rhythm.  Pulmonary:     Effort: No respiratory distress.     Breath sounds: No wheezing.  Abdominal:     General: There is no distension.     Palpations:  Abdomen is soft.     Tenderness: There is no abdominal tenderness. There is no guarding.  Musculoskeletal:        General: No tenderness. Normal range of motion.     Cervical back: Normal range of motion. No rigidity or tenderness.  Lymphadenopathy:     Head:     Right side of head: Submandibular and tonsillar adenopathy present. No submental, preauricular, posterior auricular or occipital adenopathy.     Left side of head: Submandibular and tonsillar adenopathy present. No submental, preauricular, posterior auricular or occipital adenopathy.     Cervical: Cervical adenopathy present.     Upper Body:     Right upper body: No supraclavicular, axillary, pectoral or epitrochlear adenopathy.     Left upper body: Axillary adenopathy present. No supraclavicular, pectoral or epitrochlear adenopathy.  Skin:    General: Skin is warm.     Coloration: Skin is not pale.     Findings: No bruising or rash.  Neurological:     Mental Status: He is alert and oriented to person, place, and time.  Psychiatric:        Mood and Affect: Mood and affect normal.        Behavior: Behavior normal.     LABORATORY DATA:  I have reviewed the data as listed Lab Results  Component Value Date   WBC 7.3 07/27/2023   HGB 13.9 07/27/2023   HCT 40.7 07/27/2023   MCV 104.9 (H) 07/27/2023   PLT 298 07/27/2023   Recent Labs    01/26/23 0946 07/27/23 1021 08/28/23 1131  NA 143 138 141  K 3.9 3.7 4.1  CL 101 100 99  CO2 23 27 27   GLUCOSE 100* 105* 109*  BUN 11 16 12   CREATININE 0.90 0.91 0.89  CALCIUM  9.5 9.6 9.6  GFRNONAA  --  >60  --   PROT 7.0 7.5 7.2  ALBUMIN  4.6 4.3 4.4  AST 64* 50* 51*  ALT 57* 46* 52*  ALKPHOS 74 59 79  BILITOT 0.6 0.9 0.8    RADIOGRAPHIC STUDIES: I have personally reviewed the radiological images as listed and agreed with the findings in the report. No results found.  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 Recommend CT neck/chest/abdomen/pelvis to evaluate further given his history of MALT lymphoma. Labs today- LDH elevated at 579 (previously normal). Reviewed with Dr Rennie who recommends excisional biopsy. I've reached out to Dr Rodolph to get patient scheduled.   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 He has been NED since 2019.    # Multiple colon polyps/Barrett's surveillance. [facility]July 2023. Previous EGD pathology was negative. Stable. .     # Macrocytosis- no alcohol; wine- check B12.    # Elevated LFTs- G-1 likely sec to fatty liver; continue weight loss   # Obesity- [goal: 220]- slowly losing weight/weight watchers.       DISPOSITION: Lab today (cbc, cmp, ldh) Ct neck/neck/abd/pelvis (patient prefers to schedule after his trip to beach later today) See Dr Rennie week after CT for results- la  No problem-specific Assessment & Plan notes found for this encounter.  All questions were answered. The patient knows to call the clinic with any problems, questions or concerns.   Tinnie KANDICE Dawn, NP 01/18/2024

## 2024-01-29 DIAGNOSIS — R591 Generalized enlarged lymph nodes: Secondary | ICD-10-CM | POA: Diagnosis not present

## 2024-01-30 ENCOUNTER — Ambulatory Visit: Payer: Self-pay | Admitting: General Surgery

## 2024-01-30 NOTE — H&P (Signed)
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 He was referred by his oncologist for excision biopsy of a lymph node.  He has noticed new lymphadenopathy, specifically a lump under his left arm, which he discovered while showering. Approximately two weeks ago, he developed palpable lymph nodes in the left axillary region, as well as in the cervical submandibular area. The lymph node under his arm is about five centimeters in size. No fever, weight loss, or chills, but he reports experiencing some night sweats.  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 He has been under observation without active treatment since 2019, as the lymphoma was considered low grade.  He is not currently on any treatment for his lymphoma and has not received any prior treatment, as his condition was managed with observation due to its low-grade nature.  His past medical history includes a polypectomy, during which the lymphoma was initially discovered. He also mentions a past splenic injury due to trauma.      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 pylori infection    H/O endoscopy    Hemorrhoids    History of stomach ulcers    Hypertension         PAST SURGICAL HISTORY:   Past Surgical History:  Procedure Laterality Date   EGD  07/10/2016   Duodenal tubular adenoma/Gastritis/Barrett's Esophagus/Repeat 6 months to 1 year/MUS   EGD  01/29/2017   Barrett's Esophagus/Gastritis/Repeat 12yrs/MUS   COLONOSCOPY  08/20/2018   Tubular adenoma/Hyperplastic colon polyp/Repeat 18yrs/MUS   COLONOSCOPY  12/19/2018   Tubular adenoma/Hyperplastic colon  polyp/Repeat 34yrs/MUS   EGD  12/19/2018   Hyperplastic stomach polyp/Gastritis/Repeat 40yrs/MUS   Colon @ [facility]  06/19/2022   Tubular adenomas/PHx CP/Repeat 70yrs/SMR   EGD @ [facility]  06/19/2022   Hyperplastic stomach polyps/Barrett's Esophagus/Repeat 77yrs/SMR   COLONOSCOPY     SPLENECTOMY     UPPER GASTROINTESTINAL ENDOSCOPY           MEDICATIONS:  Outpatient Encounter Medications as of 01/29/2024  Medication Sig Dispense Refill   allopurinol (ZYLOPRIM) 300 MG tablet   4   amLODIPine (NORVASC) 10 MG tablet Take 10 mg by mouth once daily.     atorvastatin (LIPITOR) 40 MG tablet Take 40 mg by mouth once daily     chlorhexidine (PERIDEX) 0.12 % solution once daily     clobetasoL (TEMOVATE) 0.05 % ointment Apply topically as needed     clopidogreL (PLAVIX) 75 mg tablet Take 75 mg by mouth once daily     FUROsemide (LASIX) 20 MG tablet Take 20 mg by mouth once daily     losartan (COZAAR) 100 MG tablet Take 100 mg by mouth once daily     metoprolol tartrate (LOPRESSOR) 25 MG tablet Take 25 mg by mouth once daily.  4   multivitamin tablet Take 1 tablet by mouth once daily     pantoprazole (PROTONIX) 20 MG DR tablet Take 1 tablet (20 mg total) by mouth 2 (two) times daily Take 30 min before meals. 60 tablet 3   sertraline (ZOLOFT) 50 MG tablet Take 50 mg by mouth once daily  aspirin 81 MG EC tablet Take 81 mg by mouth once daily. (Patient not taking: Reported on 01/29/2024)     colchicine 0.6 mg Cap  (Patient not taking: Reported on 01/29/2024)  4   LORazepam (ATIVAN) 1 MG tablet Take 1 mg by mouth continuously as needed. (Patient not taking: Reported on 01/29/2024)  1   losartan-hydrochlorothiazide (HYZAAR) 100-25 mg tablet Take 1 tablet by mouth once daily. (Patient not taking: Reported on 01/29/2024)     UNABLE TO FIND Med Name: Daphane Shepherd (Patient not taking: Reported on 01/29/2024)     No facility-administered encounter medications on file as of 01/29/2024.     ALLERGIES:   Patient has no  known allergies.   SOCIAL HISTORY:  Social History   Socioeconomic History   Marital status: Married  Tobacco Use   Smoking status: Never   Smokeless tobacco: Never   Social Drivers of Corporate investment banker Strain: Low Risk  (01/29/2024)   Overall Financial Resource Strain (CARDIA)    Difficulty of Paying Living Expenses: Not hard at all  Food Insecurity: No Food Insecurity (01/29/2024)   Hunger Vital Sign    Worried About Running Out of Food in the Last Year: Never true    Ran Out of Food in the Last Year: Never true  Transportation Needs: No Transportation Needs (01/29/2024)   PRAPARE - Administrator, Civil Service (Medical): No    Lack of Transportation (Non-Medical): No    FAMILY HISTORY:  Family History  Problem Relation Name Age of Onset   Pancreatic cancer Father     High blood pressure (Hypertension) Brother       GENERAL REVIEW OF SYSTEMS:   General ROS: negative for - chills, fatigue, fever, weight gain or weight loss.  Positive for night sweats Allergy and Immunology ROS: negative for - hives  Hematological and Lymphatic ROS: negative for - bleeding problems or bruising, negative for palpable nodes Endocrine ROS: negative for - heat or cold intolerance, hair changes Respiratory ROS: negative for - cough, shortness of breath or wheezing Cardiovascular ROS: no chest pain or palpitations GI ROS: negative for nausea, vomiting, abdominal pain, diarrhea, constipation Musculoskeletal ROS: negative for - joint swelling or muscle pain Neurological ROS: negative for - confusion, syncope Dermatological ROS: negative for pruritus and rash  PHYSICAL EXAM:  Vitals:   01/29/24 1406  BP: 124/75  Pulse: 75  .  Ht:180.3 cm (5\' 11" ) Wt:(!) 110.7 kg (244 lb) JXB:JYNW surface area is 2.35 meters squared. Body mass index is 34.03 kg/m.Marland Kitchen   GENERAL: Alert, active, oriented x3  HEENT: Pupils equal reactive to light. Extraocular movements are intact. Sclera  clear. Palpebral conjunctiva normal red color.Pharynx clear.  NECK: Bilateral neck lymphadenopathy  LUNGS: Sound clear with no rales rhonchi or wheezes.  HEART: Regular rhythm S1 and S2 without murmur.  ABDOMEN: Soft and depressible, nontender with no palpable mass, no hepatomegaly. Wounds dry and   EXTREMITIES: Well-developed well-nourished symmetrical with no dependent edema.  There is a palpable large left axillary lymph node  NEUROLOGICAL: Awake alert oriented, facial expression symmetrical, moving all extremities.   Assessment & Plan Extranodal Marginal Zone B Cell Lymphoma of Mucosa Associated Lymphoid Tissue (MALT) Diagnosed in 2019, there is new lymphadenopathy in the cervical, submandibular, and left axillary nodes. Reports of night sweats are noted, with no fever, weight loss, or chills. The largest node, approximately 5 cm, is in the left axillary region. An excisional biopsy is planned for accurate characterization and  staging. Surgical risks, including bleeding, infection, and lymphedema, were discussed alongside benefits such as definitive diagnosis. The procedure is same-day surgery, with expected recovery involving potential swelling and pain for up to two weeks. Advised to avoid activities like driving if experiencing pain or swelling. Schedule an excisional biopsy of the left axillary lymph node and perform a CT scan prior to the biopsy to assess for other significant lymphadenopathy. Plan for the excisional biopsy early next week and review CT scan results prior to the biopsy to determine the most appropriate lymph node for excision.   Lymphadenopathy, generalized [R59.1]          Patient verbalized understanding, all questions were answered, and were agreeable with the plan outlined above.   Carolan Shiver, MD  Electronically signed by Carolan Shiver, MD

## 2024-01-30 NOTE — H&P (View-Only) (Signed)
 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 He was referred by his oncologist for excision biopsy of a lymph node.  He has noticed new lymphadenopathy, specifically a lump under his left arm, which he discovered while showering. Approximately two weeks ago, he developed palpable lymph nodes in the left axillary region, as well as in the cervical submandibular area. The lymph node under his arm is about five centimeters in size. No fever, weight loss, or chills, but he reports experiencing some night sweats.  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 He has been under observation without active treatment since 2019, as the lymphoma was considered low grade.  He is not currently on any treatment for his lymphoma and has not received any prior treatment, as his condition was managed with observation due to its low-grade nature.  His past medical history includes a polypectomy, during which the lymphoma was initially discovered. He also mentions a past splenic injury due to trauma.      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 pylori infection    H/O endoscopy    Hemorrhoids    History of stomach ulcers    Hypertension         PAST SURGICAL HISTORY:   Past Surgical History:  Procedure Laterality Date   EGD  07/10/2016   Duodenal tubular adenoma/Gastritis/Barrett's Esophagus/Repeat 6 months to 1 year/MUS   EGD  01/29/2017   Barrett's Esophagus/Gastritis/Repeat 12yrs/MUS   COLONOSCOPY  08/20/2018   Tubular adenoma/Hyperplastic colon polyp/Repeat 18yrs/MUS   COLONOSCOPY  12/19/2018   Tubular adenoma/Hyperplastic colon  polyp/Repeat 34yrs/MUS   EGD  12/19/2018   Hyperplastic stomach polyp/Gastritis/Repeat 40yrs/MUS   Colon @ [facility]  06/19/2022   Tubular adenomas/PHx CP/Repeat 70yrs/SMR   EGD @ [facility]  06/19/2022   Hyperplastic stomach polyps/Barrett's Esophagus/Repeat 77yrs/SMR   COLONOSCOPY     SPLENECTOMY     UPPER GASTROINTESTINAL ENDOSCOPY           MEDICATIONS:  Outpatient Encounter Medications as of 01/29/2024  Medication Sig Dispense Refill   allopurinol (ZYLOPRIM) 300 MG tablet   4   amLODIPine (NORVASC) 10 MG tablet Take 10 mg by mouth once daily.     atorvastatin (LIPITOR) 40 MG tablet Take 40 mg by mouth once daily     chlorhexidine (PERIDEX) 0.12 % solution once daily     clobetasoL (TEMOVATE) 0.05 % ointment Apply topically as needed     clopidogreL (PLAVIX) 75 mg tablet Take 75 mg by mouth once daily     FUROsemide (LASIX) 20 MG tablet Take 20 mg by mouth once daily     losartan (COZAAR) 100 MG tablet Take 100 mg by mouth once daily     metoprolol tartrate (LOPRESSOR) 25 MG tablet Take 25 mg by mouth once daily.  4   multivitamin tablet Take 1 tablet by mouth once daily     pantoprazole (PROTONIX) 20 MG DR tablet Take 1 tablet (20 mg total) by mouth 2 (two) times daily Take 30 min before meals. 60 tablet 3   sertraline (ZOLOFT) 50 MG tablet Take 50 mg by mouth once daily  aspirin 81 MG EC tablet Take 81 mg by mouth once daily. (Patient not taking: Reported on 01/29/2024)     colchicine 0.6 mg Cap  (Patient not taking: Reported on 01/29/2024)  4   LORazepam (ATIVAN) 1 MG tablet Take 1 mg by mouth continuously as needed. (Patient not taking: Reported on 01/29/2024)  1   losartan-hydrochlorothiazide (HYZAAR) 100-25 mg tablet Take 1 tablet by mouth once daily. (Patient not taking: Reported on 01/29/2024)     UNABLE TO FIND Med Name: Daphane Shepherd (Patient not taking: Reported on 01/29/2024)     No facility-administered encounter medications on file as of 01/29/2024.     ALLERGIES:   Patient has no  known allergies.   SOCIAL HISTORY:  Social History   Socioeconomic History   Marital status: Married  Tobacco Use   Smoking status: Never   Smokeless tobacco: Never   Social Drivers of Corporate investment banker Strain: Low Risk  (01/29/2024)   Overall Financial Resource Strain (CARDIA)    Difficulty of Paying Living Expenses: Not hard at all  Food Insecurity: No Food Insecurity (01/29/2024)   Hunger Vital Sign    Worried About Running Out of Food in the Last Year: Never true    Ran Out of Food in the Last Year: Never true  Transportation Needs: No Transportation Needs (01/29/2024)   PRAPARE - Administrator, Civil Service (Medical): No    Lack of Transportation (Non-Medical): No    FAMILY HISTORY:  Family History  Problem Relation Name Age of Onset   Pancreatic cancer Father     High blood pressure (Hypertension) Brother       GENERAL REVIEW OF SYSTEMS:   General ROS: negative for - chills, fatigue, fever, weight gain or weight loss.  Positive for night sweats Allergy and Immunology ROS: negative for - hives  Hematological and Lymphatic ROS: negative for - bleeding problems or bruising, negative for palpable nodes Endocrine ROS: negative for - heat or cold intolerance, hair changes Respiratory ROS: negative for - cough, shortness of breath or wheezing Cardiovascular ROS: no chest pain or palpitations GI ROS: negative for nausea, vomiting, abdominal pain, diarrhea, constipation Musculoskeletal ROS: negative for - joint swelling or muscle pain Neurological ROS: negative for - confusion, syncope Dermatological ROS: negative for pruritus and rash  PHYSICAL EXAM:  Vitals:   01/29/24 1406  BP: 124/75  Pulse: 75  .  Ht:180.3 cm (5\' 11" ) Wt:(!) 110.7 kg (244 lb) JXB:JYNW surface area is 2.35 meters squared. Body mass index is 34.03 kg/m.Marland Kitchen   GENERAL: Alert, active, oriented x3  HEENT: Pupils equal reactive to light. Extraocular movements are intact. Sclera  clear. Palpebral conjunctiva normal red color.Pharynx clear.  NECK: Bilateral neck lymphadenopathy  LUNGS: Sound clear with no rales rhonchi or wheezes.  HEART: Regular rhythm S1 and S2 without murmur.  ABDOMEN: Soft and depressible, nontender with no palpable mass, no hepatomegaly. Wounds dry and   EXTREMITIES: Well-developed well-nourished symmetrical with no dependent edema.  There is a palpable large left axillary lymph node  NEUROLOGICAL: Awake alert oriented, facial expression symmetrical, moving all extremities.   Assessment & Plan Extranodal Marginal Zone B Cell Lymphoma of Mucosa Associated Lymphoid Tissue (MALT) Diagnosed in 2019, there is new lymphadenopathy in the cervical, submandibular, and left axillary nodes. Reports of night sweats are noted, with no fever, weight loss, or chills. The largest node, approximately 5 cm, is in the left axillary region. An excisional biopsy is planned for accurate characterization and  staging. Surgical risks, including bleeding, infection, and lymphedema, were discussed alongside benefits such as definitive diagnosis. The procedure is same-day surgery, with expected recovery involving potential swelling and pain for up to two weeks. Advised to avoid activities like driving if experiencing pain or swelling. Schedule an excisional biopsy of the left axillary lymph node and perform a CT scan prior to the biopsy to assess for other significant lymphadenopathy. Plan for the excisional biopsy early next week and review CT scan results prior to the biopsy to determine the most appropriate lymph node for excision.   Lymphadenopathy, generalized [R59.1]          Patient verbalized understanding, all questions were answered, and were agreeable with the plan outlined above.   Carolan Shiver, MD  Electronically signed by Carolan Shiver, MD

## 2024-01-31 ENCOUNTER — Ambulatory Visit
Admission: RE | Admit: 2024-01-31 | Discharge: 2024-01-31 | Disposition: A | Discharge status: Home / Self Care | Payer: Medicare Other | Source: Ambulatory Visit | Attending: Nurse Practitioner | Admitting: Nurse Practitioner

## 2024-01-31 ENCOUNTER — Telehealth: Payer: Self-pay | Admitting: Internal Medicine

## 2024-01-31 ENCOUNTER — Other Ambulatory Visit: Payer: Self-pay | Admitting: *Deleted

## 2024-01-31 DIAGNOSIS — C884 Extranodal marginal zone b-cell lymphoma of mucosa-associated lymphoid tissue (malt-lymphoma) not having achieved remission: Secondary | ICD-10-CM | POA: Insufficient documentation

## 2024-01-31 DIAGNOSIS — I700 Atherosclerosis of aorta: Secondary | ICD-10-CM | POA: Diagnosis not present

## 2024-01-31 DIAGNOSIS — R591 Generalized enlarged lymph nodes: Secondary | ICD-10-CM | POA: Insufficient documentation

## 2024-01-31 DIAGNOSIS — I6523 Occlusion and stenosis of bilateral carotid arteries: Secondary | ICD-10-CM | POA: Diagnosis not present

## 2024-01-31 DIAGNOSIS — I771 Stricture of artery: Secondary | ICD-10-CM | POA: Diagnosis not present

## 2024-01-31 DIAGNOSIS — J432 Centrilobular emphysema: Secondary | ICD-10-CM | POA: Diagnosis not present

## 2024-01-31 DIAGNOSIS — S32592A Other specified fracture of left pubis, initial encounter for closed fracture: Secondary | ICD-10-CM | POA: Diagnosis not present

## 2024-01-31 MED ORDER — IOHEXOL 300 MG/ML  SOLN
100.0000 mL | Freq: Once | INTRAMUSCULAR | Status: AC | PRN
  Administered 2024-01-31: 100 mL via INTRAVENOUS

## 2024-01-31 NOTE — Telephone Encounter (Signed)
I tried to reach patient- unable to reach- left LVM re: plan for PET scan ASAP  Please schedule- PET ASAP  Follow up as planned with me; add labs- cbc/cmp; uric acid; HOLD tube.   LA- please follow up on surgical evaluation-   Thanks GB

## 2024-02-01 ENCOUNTER — Telehealth: Payer: Self-pay

## 2024-02-01 ENCOUNTER — Other Ambulatory Visit: Payer: Self-pay

## 2024-02-01 ENCOUNTER — Telehealth: Payer: Self-pay | Admitting: Urgent Care

## 2024-02-01 ENCOUNTER — Encounter
Admission: RE | Admit: 2024-02-01 | Discharge: 2024-02-01 | Disposition: A | Discharge status: Home / Self Care | Payer: Medicare Other | Source: Ambulatory Visit | Attending: General Surgery | Admitting: General Surgery

## 2024-02-01 VITALS — Ht 71.0 in | Wt 244.0 lb

## 2024-02-01 DIAGNOSIS — I10 Essential (primary) hypertension: Secondary | ICD-10-CM

## 2024-02-01 DIAGNOSIS — I25118 Atherosclerotic heart disease of native coronary artery with other forms of angina pectoris: Secondary | ICD-10-CM

## 2024-02-01 DIAGNOSIS — I5032 Chronic diastolic (congestive) heart failure: Secondary | ICD-10-CM

## 2024-02-01 HISTORY — DX: Heart failure, unspecified: I50.9

## 2024-02-01 HISTORY — DX: Prediabetes: R73.03

## 2024-02-01 NOTE — Progress Notes (Signed)
  Perioperative Services Pre-Admission/Anesthesia Testing    Date: 02/01/24  Name: Jesse Norton MRN:   [id number]  Request for pre-operative cardiac clearance:   1. What type of surgery is being performed?  LYMPH NODE DISSECTION  2. When is this surgery scheduled?  02/08/2024   3. Type of clearance being requested (medical, pharmacy, both)? BOTH   4. Are there any medications that need to be held prior to surgery? CLOPIDOGREL  5. Practice name and name of physician performing surgery?  Performing surgeon: Dr. Carolan Shiver, MD Requesting clearance: Quentin Mulling, FNP-C     6. Anesthesia type (none, local, MAC, general)? GENERAL  7. What is the office phone and fax number?   Phone: [phone] Fax: [phone]  Quentin Mulling, MSN, APRN, FNP-C, CEN [place]  [facility] Nurse Practitioner Phone: [phone] Fax: [phone] 02/01/24 1:20 PM  NOTE: This note has been prepared using Dragon dictation software. Despite my best ability to proofread, there is always the potential that unintentional transcriptional errors may still occur from this process.

## 2024-02-01 NOTE — Patient Instructions (Addendum)
Your procedure is scheduled on: Friday 02/08/24 Report to the Registration Desk on the 1st floor of the [facility]. To find out your arrival time, please call [phone] between 1PM - 3PM on: Thursday  02/07/24 If your arrival time is 6:00 am, do not arrive before that time as the [facility] entrance doors do not open until 6:00 am.  REMEMBER: Instructions that are not followed completely may result in serious medical risk, up to and including death; or upon the discretion of your surgeon and anesthesiologist your surgery may need to be rescheduled.  Do not eat food or drink fluids after midnight the night before surgery.  No gum chewing or hard candies.   One week prior to surgery: Stop Anti-inflammatories (NSAIDS) such as Advil, Aleve, Ibuprofen, Motrin, Naproxen, Naprosyn and Aspirin based products such as Excedrin, Goody's Powder, BC Powder. Stop ANY OVER THE COUNTER supplements until after surgery.  You may however, continue to take Tylenol if needed for pain up until the day of surgery.  Stop clopidogrel (PLAVIX) 75 MG 5 days prior to your surgery as instructed by your doctor. (Take last last dose 02/02/24)  Continue taking all of your other prescription medications up until the day of surgery.  ON THE DAY OF SURGERY ONLY TAKE THESE MEDICATIONS WITH SIPS OF WATER:  amLODipine (NORVASC) 10 MG  metoprolol tartrate (LOPRESSOR) 25 MG  pantoprazole (PROTONIX) 20 MG    No Alcohol for 24 hours before or after surgery.  No Smoking including e-cigarettes for 24 hours before surgery.  No chewable tobacco products for at least 6 hours before surgery.  No nicotine patches on the day of surgery.  Do not use any "recreational" drugs for at least a week (preferably 2 weeks) before your surgery.  Please be advised that the combination of cocaine and anesthesia may have negative outcomes, up to and including death. If you test positive for cocaine, your surgery will be cancelled.  On  the morning of surgery brush your teeth with toothpaste and water, you may rinse your mouth with mouthwash if you wish. Do not swallow any toothpaste or mouthwash.  Use CHG Soap or wipes as directed on instruction sheet.  Do not wear jewelry, make-up, hairpins, clips or nail polish.  For welded (permanent) jewelry: bracelets, anklets, waist bands, etc.  Please have this removed prior to surgery.  If it is not removed, there is a chance that hospital personnel will need to cut it off on the day of surgery.  Do not wear lotions, powders, or perfumes.   Do not shave body hair from the neck down 48 hours before surgery.  Contact lenses, hearing aids and dentures may not be worn into surgery.  Do not bring valuables to the hospital. [facility] is not responsible for any missing/lost belongings or valuables.   Notify your doctor if there is any change in your medical condition (cold, fever, infection).  Wear comfortable clothing (specific to your surgery type) to the hospital.  After surgery, you can help prevent lung complications by doing breathing exercises.  Take deep breaths and cough every 1-2 hours. Your doctor may order a device called an Incentive Spirometer to help you take deep breaths. When coughing or sneezing, hold a pillow firmly against your incision with both hands. This is called "splinting." Doing this helps protect your incision. It also decreases belly discomfort.  If you are being admitted to the hospital overnight, leave your suitcase in the car. After surgery it may  be brought to your room.  In case of increased patient census, it may be necessary for you, the patient, to continue your postoperative care in the Same Day Surgery department.  If you are being discharged the day of surgery, you will not be allowed to drive home. You will need a responsible individual to drive you home and stay with you for 24 hours after surgery.   If you are taking public  transportation, you will need to have a responsible individual with you.  Please call the Pre-admissions Testing Dept. at [phone] if you have any questions about these instructions.  Surgery Visitation Policy:  Patients having surgery or a procedure may have two visitors.  Children under the age of 78 must have an adult with them who is not the patient.  Temporary Visitor Restrictions Due to increasing cases of flu, RSV and COVID-19: Children ages 61 and under will not be able to visit patients in [facility] hospitals under most circumstances.  Inpatient Visitation:    Visiting hours are 7 a.m. to 8 p.m. Up to four visitors are allowed at one time in a patient room. The visitors may rotate out with other people during the day.  One visitor age 52 or older may stay with the patient overnight and must be in the room by 8 p.m.     Preparing for Surgery with CHLORHEXIDINE GLUCONATE (CHG) Soap  Chlorhexidine Gluconate (CHG) Soap  o An antiseptic cleaner that kills germs and bonds with the skin to continue killing germs even after washing  o Used for showering the night before surgery and morning of surgery  Before surgery, you can play an important role by reducing the number of germs on your skin.  CHG (Chlorhexidine gluconate) soap is an antiseptic cleanser which kills germs and bonds with the skin to continue killing germs even after washing.  Please do not use if you have an allergy to CHG or antibacterial soaps. If your skin becomes reddened/irritated stop using the CHG.  1. Shower the NIGHT BEFORE SURGERY and the MORNING OF SURGERY with CHG soap.  2. If you choose to wash your hair, wash your hair first as usual with your normal shampoo.  3. After shampooing, rinse your hair and body thoroughly to remove the shampoo.  4. Use CHG as you would any other liquid soap. You can apply CHG directly to the skin and wash gently with a scrungie or a clean washcloth.  5. Apply  the CHG soap to your body only from the neck down. Do not use on open wounds or open sores. Avoid contact with your eyes, ears, mouth, and genitals (private parts). Wash face and genitals (private parts) with your normal soap.  6. Wash thoroughly, paying special attention to the area where your surgery will be performed.  7. Thoroughly rinse your body with warm water.  8. Do not shower/wash with your normal soap after using and rinsing off the CHG soap.  9. Pat yourself dry with a clean towel.  10. Wear clean pajamas to bed the night before surgery.  12. Place clean sheets on your bed the night of your first shower and do not sleep with pets.  13. Shower again with the CHG soap on the day of surgery prior to arriving at the hospital.  14. Do not apply any deodorants/lotions/powders.  15. Please wear clean clothes to the hospital.

## 2024-02-01 NOTE — Telephone Encounter (Signed)
   Pre-operative Risk Assessment    Patient Name: Jesse Norton  DOB: May 08, 1946 MRN: [id number]   Date of last office visit: 06/04/23 Date of next office visit: 02/07/24   Request for Surgical Clearance    Procedure:   Lymph Node dissection   Date of Surgery:  Clearance 02/08/24                                 Surgeon:  Dr. Carolan Shiver, MD  Surgeon's Group or Practice Name:  [facility]  Phone number:  [phone] Fax number:  [phone]   Type of Clearance Requested:   - Medical  - Pharmacy:  Hold Clopidogrel (Plavix) Not indicated    Type of Anesthesia:  General    Additional requests/questions:    Vance Peper   02/01/2024, 3:02 PM

## 2024-02-01 NOTE — Telephone Encounter (Signed)
   Name: Jesse Norton  DOB: Mar 13, 1946  MRN: [id number]  Primary Cardiologist: Yvonne Kendall, MD  Chart reviewed as part of pre-operative protocol coverage. Because of Damyan Corne Mccosh's past medical history and time since last visit, he will require a follow-up in-office visit in order to better assess preoperative cardiovascular risk.  Pre-op covering staff: - Please schedule appointment and call patient to inform them. If patient already had an upcoming appointment within acceptable timeframe, please add "pre-op clearance" to the appointment notes so provider is aware. - Please contact requesting surgeon's office via preferred method (i.e, phone, fax) to inform them of need for appointment prior to surgery.  PCI back in 2020. If asymptomatic at the time of office visit, can hold plavix x 5-7 days prior to procedure and resume when medically safe to do so.  Sharlene Dory, PA-C  02/01/2024, 2:37 PM

## 2024-02-01 NOTE — Telephone Encounter (Signed)
Patient is scheduled to see Dr. Cristal Deer End on 2/27 clearance can be addressed at office visit called patient to make him aware but no answer left a detailed message

## 2024-02-01 NOTE — Telephone Encounter (Signed)
Patient has an appointment scheduled 02/07/24

## 2024-02-03 ENCOUNTER — Other Ambulatory Visit: Payer: Self-pay | Admitting: Nurse Practitioner

## 2024-02-04 ENCOUNTER — Encounter
Admission: RE | Admit: 2024-02-04 | Discharge: 2024-02-04 | Disposition: A | Discharge status: Home / Self Care | Payer: Medicare Other | Source: Ambulatory Visit | Attending: General Surgery | Admitting: General Surgery

## 2024-02-04 ENCOUNTER — Other Ambulatory Visit: Payer: Medicare Other

## 2024-02-04 DIAGNOSIS — Z01818 Encounter for other preprocedural examination: Secondary | ICD-10-CM | POA: Insufficient documentation

## 2024-02-04 DIAGNOSIS — I5032 Chronic diastolic (congestive) heart failure: Secondary | ICD-10-CM | POA: Insufficient documentation

## 2024-02-04 DIAGNOSIS — I25118 Atherosclerotic heart disease of native coronary artery with other forms of angina pectoris: Secondary | ICD-10-CM | POA: Diagnosis not present

## 2024-02-04 DIAGNOSIS — Z01810 Encounter for preprocedural cardiovascular examination: Secondary | ICD-10-CM | POA: Diagnosis not present

## 2024-02-04 DIAGNOSIS — I110 Hypertensive heart disease with heart failure: Secondary | ICD-10-CM | POA: Insufficient documentation

## 2024-02-04 DIAGNOSIS — I10 Essential (primary) hypertension: Secondary | ICD-10-CM

## 2024-02-04 NOTE — Telephone Encounter (Signed)
 I will update all parties involved pt has appt 02/07/24 with Dr.End.

## 2024-02-05 ENCOUNTER — Encounter
Admission: RE | Admit: 2024-02-05 | Discharge: 2024-02-05 | Disposition: A | Discharge status: Home / Self Care | Payer: Medicare Other | Source: Ambulatory Visit | Attending: Internal Medicine | Admitting: Internal Medicine

## 2024-02-05 DIAGNOSIS — C884 Extranodal marginal zone b-cell lymphoma of mucosa-associated lymphoid tissue (malt-lymphoma) not having achieved remission: Secondary | ICD-10-CM | POA: Diagnosis not present

## 2024-02-05 DIAGNOSIS — C8594 Non-Hodgkin lymphoma, unspecified, lymph nodes of axilla and upper limb: Secondary | ICD-10-CM | POA: Diagnosis not present

## 2024-02-05 LAB — GLUCOSE, CAPILLARY: Glucose-Capillary: 109 mg/dL — ABNORMAL HIGH (ref 70–99)

## 2024-02-05 MED ORDER — FLUDEOXYGLUCOSE F - 18 (FDG) INJECTION
12.5100 | Freq: Once | INTRAVENOUS | Status: AC | PRN
  Administered 2024-02-05: 12.51 via INTRAVENOUS

## 2024-02-05 NOTE — Telephone Encounter (Signed)
 Requested by interface surescripts. Future visit in 3 weeks.  Requested Prescriptions  Pending Prescriptions Disp Refills   clopidogrel (PLAVIX) 75 MG tablet [Pharmacy Med Name: CLOPIDOGREL 75MG  TABLETS] 90 tablet 0    Sig: TAKE 1 TABLET(75 MG) BY MOUTH DAILY WITH BREAKFAST     Hematology: Antiplatelets - clopidogrel Passed - 02/05/2024  9:45 AM      Passed - HCT in normal range and within 180 days    HCT  Date Value Ref Range Status  01/18/2024 39.2 39.0 - 52.0 % Final   Hematocrit  Date Value Ref Range Status  01/26/2023 38.9 37.5 - 51.0 % Final         Passed - HGB in normal range and within 180 days    Hemoglobin  Date Value Ref Range Status  01/18/2024 13.5 13.0 - 17.0 g/dL Final  16/09/9603 54.0 13.0 - 17.0 g/dL Final  98/10/9146 82.9 13.0 - 17.7 g/dL Final         Passed - PLT in normal range and within 180 days    Platelets  Date Value Ref Range Status  01/18/2024 311 150 - 400 K/uL Final  01/26/2023 322 150 - 450 x10E3/uL Final   Platelet Count  Date Value Ref Range Status  07/27/2023 298 150 - 400 K/uL Final         Passed - Cr in normal range and within 360 days    Creatinine  Date Value Ref Range Status  07/27/2023 0.91 0.61 - 1.24 mg/dL Final  56/21/3086 578.4 20.0 - 300.0 mg/dL Final  69/62/9528 4.13 0.60 - 1.30 mg/dL Final   Creatinine, Ser  Date Value Ref Range Status  01/18/2024 1.17 0.61 - 1.24 mg/dL Final         Passed - Valid encounter within last 6 months    Recent Outpatient Visits           5 months ago Chronic heart failure with preserved ejection fraction (HCC)   [place] [facility] [place], Corrie Dandy T, NP   1 year ago Chronic heart failure with preserved ejection fraction (HCC)   [place] [facility], Corrie Dandy T, NP   1 year ago Acute cough   [place] [facility] Gabriel Cirri, NP   1 year ago Acute cough   [place] [facility] Gabriel Cirri, NP   1 year ago  Depression, recurrent [facility])   [place] [facility] [place], Dorie Rank, NP       Future Appointments             In 2 days End, Cristal Deer, MD [facility] [place]   In 3 weeks [place], Dorie Rank, NP [place] [facility]

## 2024-02-07 ENCOUNTER — Other Ambulatory Visit: Payer: Self-pay | Admitting: Nurse Practitioner

## 2024-02-07 ENCOUNTER — Ambulatory Visit: Payer: Medicare Other | Attending: Internal Medicine | Admitting: Internal Medicine

## 2024-02-07 ENCOUNTER — Encounter: Payer: Self-pay | Admitting: General Surgery

## 2024-02-07 ENCOUNTER — Encounter: Payer: Self-pay | Admitting: Internal Medicine

## 2024-02-07 VITALS — BP 110/58 | HR 99 | Temp 98.3°F | Ht 70.5 in | Wt 235.1 lb

## 2024-02-07 DIAGNOSIS — E785 Hyperlipidemia, unspecified: Secondary | ICD-10-CM | POA: Diagnosis not present

## 2024-02-07 DIAGNOSIS — C884 Extranodal marginal zone b-cell lymphoma of mucosa-associated lymphoid tissue (malt-lymphoma) not having achieved remission: Secondary | ICD-10-CM

## 2024-02-07 DIAGNOSIS — I2510 Atherosclerotic heart disease of native coronary artery without angina pectoris: Secondary | ICD-10-CM | POA: Diagnosis not present

## 2024-02-07 DIAGNOSIS — I10 Essential (primary) hypertension: Secondary | ICD-10-CM | POA: Diagnosis not present

## 2024-02-07 DIAGNOSIS — I25118 Atherosclerotic heart disease of native coronary artery with other forms of angina pectoris: Secondary | ICD-10-CM | POA: Insufficient documentation

## 2024-02-07 DIAGNOSIS — I5032 Chronic diastolic (congestive) heart failure: Secondary | ICD-10-CM | POA: Diagnosis not present

## 2024-02-07 MED ORDER — CEFAZOLIN SODIUM-DEXTROSE 2-4 GM/100ML-% IV SOLN
2.0000 g | INTRAVENOUS | Status: AC
Start: 2024-02-08 — End: 2024-02-09
  Administered 2024-02-08: 2 g via INTRAVENOUS

## 2024-02-07 MED ORDER — LACTATED RINGERS IV SOLN: INTRAVENOUS | Status: DC

## 2024-02-07 MED ORDER — ASPIRIN 81 MG PO CHEW
324.0000 mg | CHEWABLE_TABLET | Freq: Once | ORAL | Status: AC
  Administered 2024-02-07: 324 mg via ORAL

## 2024-02-07 MED ORDER — ASPIRIN 81 MG PO TBEC: 81.0000 mg | DELAYED_RELEASE_TABLET | Freq: Every day | ORAL | Status: DC

## 2024-02-07 MED ORDER — CHLORHEXIDINE GLUCONATE 0.12 % MT SOLN
15.0000 mL | Freq: Once | OROMUCOSAL | Status: AC
  Administered 2024-02-08: 15 mL via OROMUCOSAL

## 2024-02-07 MED ORDER — ORAL CARE MOUTH RINSE: 15.0000 mL | Freq: Once | OROMUCOSAL | Status: AC

## 2024-02-07 NOTE — Progress Notes (Signed)
 Perioperative / Anesthesia Services  Pre-Admission Testing Clinical Review / Pre-Operative Anesthesia Consult  Date: 02/07/24  Patient Demographics:  Name: [name] DOB: [birth year] MRN:   [id number]  Planned Surgical Procedure(s):    Case: [id number] Date/Time: 02/08/24 1009   Procedure: LYMPH NODE DISSECTION (Left)   Anesthesia type: General   Pre-op diagnosis: R59.1 lymphadenopathy, generalized   Location: [facility] OR ROOM 02 / [facility] FOR AN[facility]   Surgeons: Carolan Shiver, MD      NOTE: Available PAT nursing documentation and vital signs have been reviewed. Clinical nursing staff has updated patient's PMH/PSHx, current medication list, and drug allergies/intolerances to ensure comprehensive history available to assist in medical decision making as it pertains to the aforementioned surgical procedure and anticipated anesthetic course. Extensive review of available clinical information personally performed. [place] [facility] and PSHx updated with any diagnoses/procedures that  may have been inadvertently omitted during his intake with the pre-admission testing department's nursing staff.  Clinical Discussion:  Jesse Norton is a 78 y.o. male who is submitted for pre-surgical anesthesia review and clearance prior to him undergoing the above procedure. Patient has never been a smoker in the past. \Pertinent PMH includes: CAD, HFpEF, ascending aorta dilatation, aortic atherosclerosis, HTN, HLD, prediabetes, pulmonary emphysema, GERD (on daily PPI), hiatal hernia, (on daily PPI), hiatal hernia, gastritis, Barrett's esophagus, gastric ulcer, MALT lymphoma, BPH, anxiety.  Patient is followed by cardiology (End, MD). He was last seen in the cardiology clinic on 02/07/2024; notes reviewed. At the time of his clinic visit, patient doing well overall from a cardiovascular perspective.  Patient with complaints of URI symptoms over the course of the last 3 weeks.  Patient with  dysgeusia, sore throat, diaphoresis, and an unintentional 10 pound weight loss.  Patient attributed weight loss to "certain foods not tasting good".  Patient also experiencing episodes of being lightheaded associated with position changes.  Patient denied any chest pain, shortness of breath, PND, orthopnea, palpitations, significant peripheral edema, weakness, fatigue, or presyncope/syncope. Patient with a past medical history significant for cardiovascular diagnoses. Documented physical exam was grossly benign, providing no evidence of acute exacerbation and/or decompensation of the patient's known cardiovascular conditions.  Exercise tolerance test performed on 11/06/2018 revealing overall good exercise capacity with normal heart rate and blood pressure responses.  There was no angina reported during the stress test, however there was 1 mm horizontal ST segment depressions noted in the inferolateral leads during stress.  Additionally, there was frequent ventricular ectopy noted.  Overall, study determined to be intermediate risk (Duke treadmill score = 2).  Coronary CTA was performed on 12/23/2018 that demonstrated an Agatston coronary artery calcium score of 2408. This placed patient in the 93rd third percentile for age, sex, and race matched controls. Calcium depositions noted diffusely.  Study demonstrates normal coronary origin with RIGHT dominance.  Study was sent for FFRct analysis (ranges: < 0.75 high likelihood of hemodynamically significant stenosis, 0.76-0.80 borderline, > 0.80 normal):  LM:   No significant stenosis.  FFRct 0.99. LAD: FFRct 0.6 in the mid LAD, consistent with severe stenosis. LCX: FFRct 0.99 proximal, 0.87 mid, 0.77 distal. Concerning for severe mid to distal stenosis. RCA: No significant stenosis.  FFRct 0.89 distal.  Given findings on coronary CTA, recommendations were for cardiac catheterization.  Patient underwent diagnostic LEFT heart catheterization on 01/21/2019  revealing multivessel CAD; 40% proximal LAD, 85% mid LAD, 70% D2, and 70% proximal LCx.  PCI was subsequently performed placing a 2.75 x 15 mm Resolute  Onyx DES to the proximal LCx lesion.  Procedure yielded excellent angiographic result and TIMI-3 flow.  Given significant mid LAD disease with heavy calcification, staged PCI with orbital atherectomy was recommended.  Patient underwent the recommended orbital atherectomy and staged PCI of the mid LAD on 02/03/2019.  3.0 x 15 mm Resolute Onyx DES x 1 was placed to the mid LAD lesion.  Again, procedure yielded excellent angiographic results and TIMI-3 flow.  TTE was performed on 07/16/2023 revealing normal left ventricular systolic function with an EF of 60 to 65%.  There were no regional wall motion abnormalities.  Left ventricular diastolic Doppler parameters were normal.  Right ventricular size and function normal.  There was mild mitral valve regurgitation.  All transvalvular gradients were noted to be normal providing no evidence suggestive of valvular stenosis.  There was mild dilatation of the ascending aorta measuring 38 mm noted.  Given patient's significant coronary artery disease, patient remained on daily oral DAPT therapy using ASA + clopidogrel.  Patient reported be compliant with therapy with no evidence or reports of GI/GU related bleeding.  Blood pressure well controlled at 110/58 mmHg on currently prescribed CCB (amlodipine), ARB (losartan), and beta-blocker (metoprolol tartrate) therapies.  Patient is on atorvastatin for his HLD diagnosis and ASCVD prevention.  Patient has a supply of short acting nitrates (NTG) to use on a as needed basis for recurrent angina/anginal equivalent symptoms; denied recent use. Patient with a prediabetes diagnosis that he is endeavoring to control with diet lifestyle modification alone.  Most recent hemoglobin A1c was 6.0% when checked on 08/28/2023. Patient does not have an OSAH diagnosis. Patient is able to  complete all of his  ADL/IADLs without cardiovascular limitation.  Per the DASI, patient is able to achieve at least 4 METS of physical activity without experiencing any significant degree of angina/anginal equivalent symptoms.  No changes were made to his medication regimen.  Patient follow-up with his outpatient cardiology team in 4 to 6 weeks or sooner if needed.  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 He is status post colon polyp resection.  There has been no evidence of disease since 2019.  Recently presented with nontender submandibular, cervical, and LEFT axillary lymphadenopathy.  Repeat LDH tumor marker was elevated at 579 U/L; previously normal.  CT imaging of the neck, chest, abdomen, and pelvis revealed numerous new bulky lymph nodes predominantly in the neck and chest, but also present in the abdomen and pelvis consistent with recurrent lymphoma.  Patient was referred to general surgery for further evaluation and consideration of biopsy for further characterization and staging.  Surgery notes that the largest lymph node is located in the LEFT axillary region and measures approximately 5 cm.  Following consult with general surgery, the decision has been made to move forward with LEFT AXILLARY LYMPH NODE BIOPSY on 02/08/2024 with Dr. Carolan Shiver, MD.    Given patient's past medical history significant for cardiovascular diagnoses, presurgical cardiac clearance was sought by the PAT team.  Per cardiology, "Mr. Lindblad has not had any angina, though I am concerned that his clopidogrel has been held for the last 6 days without any antiplatelet therapy still being taken in anticipation of his lymph node dissection tomorrow.  His EKG shows nonspecific ST/T abnormalities that are new compared to the tracing just a few days ago, though they have been present on  more remote EKGs.  As cessation of clopidogrel was made without our recommendation, I have administered aspirin 324 mg x 1 in the office today and advised Mr. Habibi to continue taking aspirin 81 mg daily until his surgical team feels it is safe for him to resume his clopidogrel.  I have advised him to seek immediate medical attention were he to develop any chest pain or shortness of breath. I think it is reasonable for him to proceed with this low risk procedure from a cardiovascular standpoint, though given his history of multivessel PCI in 2020, I have asked him to take aspirin while clopidogrel is on hold. He endorses some recent diaphoresis that is concerning for a B symptom.  I advised him to discuss his recent URI symptoms with his surgical/anesthesia team to ensure that he is not at increased perioperative risk due to this".  Again, this patient is on daily dual antithrombotic therapy.  Patient was advised by his general surgeon to hold his clopidogrel for 5 days prior to his procedure with plans to restart as soon as postoperative bleeding risk felt to be minimized.  When seen by cardiologist on 02/07/2024, patient with subtle anterior ECG changes.  Patient was given ASA 324 mg in the cardiology clinic.  Patient was advised to remain on his daily low-dose ASA throughout his perioperative without discontinuation.  This plan was discussed with general surgeon by cardiologist and he agreed. In review of the information that was provided to the patient in PAT, based on directives from general surgery, patient's last dose of clopidogrel was on 02/02/2024.  Patient denies previous perioperative complications with anesthesia in the past. In review his EMR, it is noted that patient underwent a general anesthetic course here at [facility] ([facility] in 06/2022 without documented complications.      02/07/2024    9:41 AM 02/01/2024   12:31 PM 01/18/2024    9:42 AM  Vitals with  BMI  Height 5' 10.5" 5\' 11"    Weight 235 lbs 2 oz 244 lbs 244 lbs  BMI 33.25 34.05   Systolic 110  137  Diastolic 58  79  Pulse 99  92   Providers/Specialists:  NOTE: Primary physician provider listed below. Patient may have been seen by APP or partner within same practice.   PROVIDER ROLE / SPECIALTY LAST Beverely Pace, MD General Surgery (Surgeon) 01/29/2024  Marjie Skiff, NP Primary Care Provider 08/28/2023  Yvonne Kendall, MD Cardiology 02/07/2024  Louretta Shorten, MD Medical Oncology 01/18/2024   Allergies:  No Known Allergies Current Home Medications:   No current facility-administered medications for this encounter.    allopurinol (ZYLOPRIM) 300 MG tablet   amLODipine (NORVASC) 10 MG tablet   aspirin EC 81 MG tablet   atorvastatin (LIPITOR) 40 MG tablet   clobetasol ointment (TEMOVATE) 0.05 %   clopidogrel (PLAVIX) 75 MG tablet   furosemide (LASIX) 20 MG tablet   losartan (COZAAR) 100 MG tablet   metoprolol tartrate (LOPRESSOR) 25 MG tablet   nitroGLYCERIN (NITROSTAT) 0.4 MG SL tablet   OVER THE COUNTER MEDICATION   pantoprazole (PROTONIX) 20 MG tablet   Semaglutide,0.25 or 0.5MG /DOS, (OZEMPIC, 0.25 OR 0.5 MG/DOSE,) 2 MG/1.5ML SOPN   sertraline (ZOLOFT) 50 MG tablet   History:   Past Medical History:  Diagnosis Date   (HFpEF) heart failure with preserved ejection fraction (HCC) 01/21/2019   a.) LHC 01/21/2019: EF 55-60%, LVEDP 25-30 mmHg   Anxiety    Aortic atherosclerosis (HCC)  Ascending aorta dilatation (HCC) 07/16/2023   a.) TTE 07/16/2023: 38 mm   Barrett's esophagus    BPH (benign prostatic hyperplasia)    CAD (coronary artery disease) 10/2018   a.) ETT 11/06/2018: 1mm horizontal inflat ST depression, freq PVC's -> intermediate risk; b.) cCTA 12/23/2018: Ca2+ 2408 (93rd %'ile); FFR 0.6 mLAD, 0.87 mLCx, 0.77 dLCx --> cath recommended; c.) LHC/PCI 01/01/2019:  40% pLAD, 85% mLAD, 70% D2, 70% pLCx (2.75 x 15 mm Resolute Onyx DES),  RCA min irregs -- staged PCI; d.) staged PCI 01/21/2019: orbital athrectomy + 3.0 x 15 mm Resolute Onyx DES mLAD   Colon polyps    Duodenal adenoma    Gastritis    GERD (gastroesophageal reflux disease)    Gout    H. pylori infection    H/O urethral stricture    Hemorrhoids    Hiatal hernia    History of bilateral cataract extraction 2023   History of splenectomy 1957   Hypertension    Mucosa-associated lymphoid tissue (MALT) lymphoma    Pre-diabetes    Pulmonary emphysema (HCC)    Stomach ulcer    Past Surgical History:  Procedure Laterality Date   CATARACT EXTRACTION W/PHACO Left 03/07/2022   Procedure: CATARACT EXTRACTION PHACO AND INTRAOCULAR LENS PLACEMENT (IOC) LEFT VIVITY TORIC LENS 10.41 00:55.7;  Surgeon: Galen Manila, MD;  Location: [facility];  Service: Ophthalmology;  Laterality: Left;   CATARACT EXTRACTION W/PHACO Right 03/28/2022   Procedure: CATARACT EXTRACTION PHACO AND INTRAOCULAR LENS PLACEMENT (IOC) RIGHT VIVITY LENS 6.68 00:50.0;  Surgeon: Galen Manila, MD;  Location: [facility];  Service: Ophthalmology;  Laterality: Right;   COLONOSCOPY     COLONOSCOPY N/A 06/19/2022   Procedure: COLONOSCOPY;  Surgeon: Jaynie Collins, DO;  Location: [facility] ENDOSCOPY;  Service: Gastroenterology;  Laterality: N/A;   COLONOSCOPY WITH PROPOFOL N/A 08/20/2018   Procedure: COLONOSCOPY WITH PROPOFOL;  Surgeon: Christena Deem, MD;  Location: [facility] ENDOSCOPY;  Service: Endoscopy;  Laterality: N/A;   COLONOSCOPY WITH PROPOFOL N/A 12/19/2018   Procedure: COLONOSCOPY WITH PROPOFOL;  Surgeon: Christena Deem, MD;  Location: [facility] ENDOSCOPY;  Service: Endoscopy;  Laterality: N/A;   CORONARY ATHERECTOMY N/A 02/03/2019   Procedure: CORONARY ATHERECTOMY;  Surgeon: Yvonne Kendall, MD;  Location: [facility] INVASIVE CV LAB;  Service: Cardiovascular;  Laterality: N/A;   CORONARY STENT INTERVENTION N/A 01/21/2019   Procedure: CORONARY STENT INTERVENTION;  Surgeon:  Yvonne Kendall, MD;  Location: [facility] INVASIVE CV LAB;  Service: Cardiovascular;  Laterality: N/A;   CORONARY STENT INTERVENTION N/A 02/03/2019   Procedure: CORONARY STENT INTERVENTION;  Surgeon: Yvonne Kendall, MD;  Location: [facility] INVASIVE CV LAB;  Service: Cardiovascular;  Laterality: N/A;   CORONARY ULTRASOUND/IVUS N/A 02/03/2019   Procedure: Intravascular Ultrasound/IVUS;  Surgeon: Yvonne Kendall, MD;  Location: [facility] INVASIVE CV LAB;  Service: Cardiovascular;  Laterality: N/A;   CYSTOSCOPY WITH URETHRAL DILATATION N/A 08/16/2018   Procedure: CYSTOSCOPY WITH URETHRAL DILATATION;  Surgeon: Sondra Come, MD;  Location: [facility] ORS;  Service: Urology;  Laterality: N/A;   ESOPHAGOGASTRODUODENOSCOPY N/A 01/29/2017   Procedure: ESOPHAGOGASTRODUODENOSCOPY (EGD);  Surgeon: Christena Deem, MD;  Location: [facility] ENDOSCOPY;  Service: Endoscopy;  Laterality: N/A;   ESOPHAGOGASTRODUODENOSCOPY N/A 06/19/2022   Procedure: ESOPHAGOGASTRODUODENOSCOPY (EGD);  Surgeon: Jaynie Collins, DO;  Location: [facility] ENDOSCOPY;  Service: Gastroenterology;  Laterality: N/A;   ESOPHAGOGASTRODUODENOSCOPY (EGD) WITH PROPOFOL N/A 07/10/2016   Procedure: ESOPHAGOGASTRODUODENOSCOPY (EGD) WITH PROPOFOL;  Surgeon: Christena Deem, MD;  Location: [facility] ENDOSCOPY;  Service: Endoscopy;  Laterality: N/A;  ESOPHAGOGASTRODUODENOSCOPY (EGD) WITH PROPOFOL N/A 12/19/2018   Procedure: ESOPHAGOGASTRODUODENOSCOPY (EGD) WITH PROPOFOL;  Surgeon: Christena Deem, MD;  Location: [facility] ENDOSCOPY;  Service: Endoscopy;  Laterality: N/A;   left elbow repair Left    LEFT HEART CATH AND CORONARY ANGIOGRAPHY Left 01/21/2019   Procedure: LEFT HEART CATH AND CORONARY ANGIOGRAPHY;  Surgeon: Yvonne Kendall, MD;  Location: [facility] INVASIVE CV LAB;  Service: Cardiovascular;  Laterality: Left;   SPLENECTOMY  1957   TONSILLECTOMY     as a child   Family History  Adopted: Yes  Problem Relation Age of Onset   Heart attack Mother 2   Pancreatic  cancer Father        died in WWII - pt adopted   Hypertension Sister    Hyperlipidemia Sister    Hypertension Brother    Hyperlipidemia Brother    Prostate cancer Neg Hx    Kidney cancer Neg Hx    Bladder Cancer Neg Hx    Social History   Tobacco Use   Smoking status: Never   Smokeless tobacco: Never  Substance Use Topics   Alcohol use: Not Currently   Pertinent Clinical Results:  LABS:  Hospital Outpatient Visit on 02/05/2024  Component Date Value Ref Range Status   Glucose-Capillary 02/05/2024 109 (H)  70 - 99 mg/dL Final   Glucose reference range applies only to samples taken after fasting for at least 8 hours.  Appointment on 01/18/2024  Component Date Value Ref Range Status   LDH 01/18/2024 579 (H)  98 - 192 U/L Final   Performed at [facility], [address] [place], [place] [address]   WBC 01/18/2024 7.5  4.0 - 10.5 K/uL Final   RBC 01/18/2024 3.83 (L)  4.22 - 5.81 MIL/uL Final   Hemoglobin 01/18/2024 13.5  13.0 - 17.0 g/dL Final   HCT 66/05/3015 39.2  39.0 - 52.0 % Final   MCV 01/18/2024 102.3 (H)  80.0 - 100.0 fL Final   MCH 01/18/2024 35.2 (H)  26.0 - 34.0 pg Final   MCHC 01/18/2024 34.4  30.0 - 36.0 g/dL Final   RDW 12/19/3233 12.6  11.5 - 15.5 % Final   Platelets 01/18/2024 311  150 - 400 K/uL Final   nRBC 01/18/2024 0.0  0.0 - 0.2 % Final   Neutrophils Relative % 01/18/2024 51  % Final   Neutro Abs 01/18/2024 3.8  1.7 - 7.7 K/uL Final   Lymphocytes Relative 01/18/2024 31  % Final   Lymphs Abs 01/18/2024 2.3  0.7 - 4.0 K/uL Final   Monocytes Relative 01/18/2024 15  % Final   Monocytes Absolute 01/18/2024 1.2 (H)  0.1 - 1.0 K/uL Final   Eosinophils Relative 01/18/2024 2  % Final   Eosinophils Absolute 01/18/2024 0.1  0.0 - 0.5 K/uL Final   Basophils Relative 01/18/2024 1  % Final   Basophils Absolute 01/18/2024 0.1  0.0 - 0.1 K/uL Final   Immature Granulocytes 01/18/2024 0  % Final   Abs Immature Granulocytes 01/18/2024 0.03  0.00 - 0.07 K/uL  Final   Performed at [facility], [address] [place], [place] [address]   Sodium 01/18/2024 132 (L)  135 - 145 mmol/L Final   Potassium 01/18/2024 4.1  3.5 - 5.1 mmol/L Final   Chloride 01/18/2024 93 (L)  98 - 111 mmol/L Final   CO2 01/18/2024 26  22 - 32 mmol/L Final   Glucose, Bld 01/18/2024 105 (H)  70 - 99 mg/dL Final   Glucose reference  range applies only to samples taken after fasting for at least 8 hours.   BUN 01/18/2024 24 (H)  8 - 23 mg/dL Final   Creatinine, Ser 01/18/2024 1.17  0.61 - 1.24 mg/dL Final   Calcium 16/09/9603 8.8 (L)  8.9 - 10.3 mg/dL Final   Total Protein 54/08/8118 7.4  6.5 - 8.1 g/dL Final   Albumin 14/78/2956 4.1  3.5 - 5.0 g/dL Final   AST 21/30/8657 65 (H)  15 - 41 U/L Final   ALT 01/18/2024 44  0 - 44 U/L Final   Alkaline Phosphatase 01/18/2024 65  38 - 126 U/L Final   Total Bilirubin 01/18/2024 1.8 (H)  0.0 - 1.2 mg/dL Final   GFR, Estimated 01/18/2024 >60  >60 mL/min Final   Comment: (NOTE) Calculated using the CKD-EPI Creatinine Equation (2021)    Anion gap 01/18/2024 13  5 - 15 Final   Performed at [facility], [address] [place], [place] [address]    ECG: Date: 02/07/2024  Time ECG obtained: 0946 AM Rate: 99 bpm Rhythm: normal sinus Axis (leads I and aVF): normal Intervals: PR 158 ms. QRS 70 ms. QTc 413 ms. ST segment and T wave changes: Anterior ST/T wave abnormalities noted Evidence of a possible, age undetermined, prior infarct:  No; Comparison: Subtle progressive anterior ST/T wave changes when compared to tracing on 02/04/2024. Patient off clopidogrel per direction of general surgery. Cardiology aware of changes. Patient dosed with ASA 324 mg in cardiology clinic and started on ASA 81 mg daily that should remain uninterrupted per Dr. Okey Dupre.    IMAGING / PROCEDURES: NM PET IMAGE RESTAGE (PS) SKULL BASE TO THIGH (F-18 FDG) performed on 02/05/2024 Results pending as of 12:00 PM on 02/07/2024   CT SOFT TISSUE NECK  AND CT CHEST, ABDOMEN, PELVIS W CONTRAST performed on 01/31/2024 Numerous new bulky lymph nodes, predominantly in the neck and chest although also present in the abdomen and pelvis, consistent with recurrent lymphoma. 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 Emphysema. Coronary artery disease. Aortic atherosclerosis   TRANSTHORACIC ECHOCARDIOGRAM performed on 07/16/2023 Left ventricular ejection fraction, by estimation, is 60 to 65%. The left ventricle has normal function. The left ventricle has no regional wall motion abnormalities. Left ventricular diastolic parameters were normal.  Right ventricular systolic function is normal. The right ventricular size is normal.  The mitral valve is normal in structure. Mild mitral valve regurgitation.  The aortic valve was not well visualized. Aortic valve regurgitation is not visualized.  Aortic dilatation noted. There is mild dilatation of the ascending aorta, measuring 38 mm.   CORONARY ATHERECTOMY AND STENT INTERVENTION performed on 02/03/2019 Severe, heavily calcified mid LAD stenosis (80-90%), unchanged from diagnostic catheterization earlier this month. Widely patent LCx stent. Normal left ventricular filling pressure. Successful orbital atherectomy and IVUS-guided PCI to the mid LAD using a Resolute Onyx 3.0 x 15 mm drug-eluting stent (postdilated to 3.6 mm) with 0% residual stenosis and TIMI-3 flow. Recommendations: Overnight extended recovery. Continue dual antiplatelet therapy with aspirin and clopidogrel for at least 6 months. Aggressive secondary prevention.   LEFT HEART CATHETERIZATION AND CORONARY ANGIOGRAPHY performed on 01/21/2019 Significant 2-vessel coronary artery disease, including 80-90% mid LAD stenosis with heavy calcification, as well as 70% proximal LCx stenosis. Normal left ventricular systolic function. Moderately elevated left ventricular filling pressure. Successful PCI to  proximal LCx using Resolute Onyx 2.75 x 15 mm drug-eluting stent (postdilated to 3.1 mm proximally) with 0% residual stenosis and TIMI-3 flow.  Recommendations: Given significant mid LAD disease with heavy calcification, we will plan for staged PCI using orbital atherectomy in ~2 weeks. Dual antiplatelet therapy with aspirin and clopidogrel for at least 6 months. Aggressive secondary prevention. If renal function allows, consider beginning gentle diuresis tomorrow for an element of HFpEF. Overnight extended recovery.     CT CORONARY MORPH W/CTA COR W/SCORE W/CA W/CM &/OR WO/CM performed on 01/09/2019 Coronary calcium score of 2408. This was 93rd percentile for age and sex matched control. Normal coronary origin with right dominance. Three vessel CAD. FFRct analysis (ranges: < 0.75 high likelihood of hemodynamically significant stenosis, 0.76-0.80 borderline, > 0.80 normal): Left Main:  No significant stenosis.  FFRct 0.99. LAD: FFRct 0.6 in the mid LAD, consistent with severe stenosis LCX: FFRct 0.99 proximal, 0.87 mid, 0.77 distal. Concerning for severe mid to distal stenosis. RCA: No significant stenosis.  FFRct 0.89 distal. 5. Given the high calcium score, concern for obstructive disease, would recommend cardiac catheterization.  EXERCISE TOLERANCE TEST performed on 11/06/2018 Good exercise capacity with normal heart rate and blood pressure responses. No angina reported during stress. There are 1 mm horizontal ST segment depressions in the inferolateral leads during stress. Frequent PVC's noted during stress. Intermediate risk exercise tolerance test (Duke treadmill score = 2).   Impression and Plan:  Jesse BIERNAT has been referred for pre-anesthesia review and clearance prior to him undergoing the planned anesthetic and procedural courses. Available labs, pertinent testing, and imaging results were personally reviewed by me in preparation for upcoming operative/procedural course.  [facility] medical record has been updated following extensive record review and patient interview with PAT staff.   Learned today that patient experiencing URI symptoms x 3 weeks. He denies SOB and fevers. Mainly has dysgeusia, sore throat, and episodes of diaphoresis. He has lost about 10 pounds per his report, which he attributes to "certain foods not tasting good". Of concern, the diaphoresis and unintentional weight loss may be B symptoms related to his recurrent extranodal marginal zone B cell lymphoma of mucosa associated lymphoid tissue (MALT). Tissue sampling required for staging and guidance with treatment decisions. With the aforementioned information in mind, surgeon was made aware and wishes to proceed, as does the patient.  This patient has been appropriately cleared by cardiology with an overall ACCEPTABLE risk of experiencing significant perioperative cardiovascular complications. Based on clinical review performed today (02/07/24), barring any significant acute changes in the patient's overall condition, it is anticipated that he will be able to proceed with the planned surgical intervention. Any acute changes in clinical condition may necessitate his procedure being postponed and/or cancelled. Patient will meet with anesthesia team (MD and/or CRNA) on the day of his procedure for preoperative evaluation/assessment. He is aware that based on his clinical condition, given his currently reported symptoms, anesthesiologist may elect to postpone his case pending resolution of his reported symptoms. Specific questions and/or concerns regarding anesthetic course will be fielded at that time.   Pre-surgical instructions were reviewed with the patient during his PAT appointment, and questions were fielded to satisfaction by PAT clinical staff. He has been instructed on which medications that he will need to hold prior to surgery, as well as the ones that have been deemed safe/appropriate to take on  the day of his procedure. As part of the general education provided by PAT, patient made aware both verbally and in writing, that he would need to abstain from the use of any illegal substances during his perioperative course. He was  advised that failure to follow the provided instructions could necessitate case cancellation or result in serious perioperative complications up to and including death. Patient encouraged to contact PAT and/or his surgeon's office to discuss any questions or concerns that may arise prior to surgery; verbalized understanding.   Quentin Mulling, MSN, APRN, FNP-C, [facility]  Perioperative Services Nurse Practitioner Phone: [phone] Fax: [phone] 02/07/24 5:21 PM  NOTE: This note has been prepared using Dragon dictation software. Despite my best ability to proofread, there is always the potential that unintentional transcriptional errors may still occur from this process.

## 2024-02-07 NOTE — Progress Notes (Signed)
 Cardiology Office Note:  .   Date:  02/07/2024  ID:  Jesse Norton, DOB 1946-03-28, MRN [id number] PCP: Marjie Skiff, NP  [facility] Providers Cardiologist:  Yvonne Kendall, MD     History of Present Illness: .   Jesse Norton is a 78 y.o. male coronary artery disease status post PCI to the LCx and LAD in 2020, chronic HFpEF, hypertension, Barrett's esophagus, MALT lymphoma, gout, BPH, and obesity, who presents for follow-up of CAD and HFpEF.  I last saw him in 05/2023, at which time he was feeling well though his wife was concerned that Jesse Norton occasionally appeared to be out of breath.  We agreed to perform an echocardiogram, which showed normal LV systolic and diastolic function and mild MR.  He recently noted a new mass under his left arm.  Subsequent evaluation by oncology revealed new cervical, submandibular, and left axillary lymphadenopathy for which Jesse Norton is scheduled to undergo lymph node dissection tomorrow.  Today, Jesse Norton reports that he has been dealing with a nagging cough and URI symptoms for the last 3 weeks.  He also recently developed changes in his taste as well as a sore throat.  He lost 10 pounds in the last week that he attributes to certain foods not tasting good.  He has also noticed intermittent diaphoresis.  He denies chest pain and shortness of breath as well as palpitations.  He has noted some intermittent orthostatic lightheadedness but has not passed out or fallen.  He has been holding clopidogrel for 6 days at the instruction of his surgical team.  ROS: See HPI  Studies Reviewed: Marland Kitchen   EKG Interpretation Date/Time:  Thursday February 07 2024 09:46:05 EST Ventricular Rate:  99 PR Interval:  158 QRS Duration:  70 QT Interval:  322 QTC Calculation: 413 R Axis:   8  Text Interpretation: Normal sinus rhythm Nonspecific ST and T wave abnormality Abnormal ECG When compared with ECG of 04-Feb-2024 14:24, Nonspecific ST and T wave  abnormality in anterolateral leads is new since 02/04/2024 Confirmed by Elanor Cale, Cristal Deer [phone]) on 02/07/2024 1:58:51 PM    TTE (07/16/2023): Normal LV size and wall thickness.  LVEF 60-65% with normal wall motion and diastolic parameters.  Normal RV size and function.  Normal biatrial size.  No pericardial effusion.  Mild mitral regurgitation.  Otherwise, no significant valvular abnormalities.  Borderline dilation of the ascending aorta, measuring 3.8 cm.  Risk Assessment/Calculations:             Physical Exam:   VS:  BP (!) 110/58 (BP Location: Left Arm, Patient Position: Sitting, Cuff Size: Normal)   Pulse 99   Temp 98.3 F (36.8 C)   Ht 5' 10.5" (1.791 m)   Wt 235 lb 2 oz (106.7 kg)   SpO2 93%   BMI 33.26 kg/m    Wt Readings from Last 3 Encounters:  02/07/24 235 lb 2 oz (106.7 kg)  02/01/24 244 lb (110.7 kg)  01/18/24 244 lb (110.7 kg)    General:  NAD. Neck: No JVD or HJR. Lungs: Clear to auscultation bilaterally without wheezes or crackles. Heart: Regular rate and rhythm without murmurs, rubs, or gallops. Abdomen: Soft, nontender, nondistended. Extremities: No lower extremity edema.  ASSESSMENT AND PLAN: .    Coronary artery disease without angina and hyperlipidemia: Jesse Norton has not had any angina though I am concerned that his clopidogrel has been held for the last 6 days without any antiplatelet therapy still being taken  in anticipation of his lymph node dissection tomorrow.  His EKG shows nonspecific ST/T abnormalities that are new compared to the tracing just a few days ago, though they have been present on more remote EKGs.  As cessation of clopidogrel was made without our recommendation, I have administered aspirin 324 mg x 1 in the office today and advised Jesse Norton to continue taking aspirin 81 mg daily until his surgical team feels it is safe for him to resume clopidogrel.  I have advised him to seek immediate medical attention where he to develop any chest pain or  shortness of breath.  Continue atorvastatin 40 mg daily for secondary prevention.  Hypertension: Blood pressure low normal today but typically a bit higher.  We will plan to continue current regimen of amlodipine, losartan, and metoprolol.  Chronic HFpEF: Jesse Norton appears euvolemic with stable NYHA class I-2 symptoms.  Defer medication changes at this time.  Lymphoma: Jesse Norton has a history of MALT lymphoma but has noticed left axillary and cervical lymphadenopathy for which he is scheduled to undergo a lymph node dissection tomorrow.  He also endorses some recent diaphoresis that is concerning for a B symptom.  I think it is reasonable for him to proceed with this low risk procedure from a cardiovascular standpoint, though given his history of multivessel PCI in 2020, I have asked him to take aspirin while clopidogrel is on hold.  I advised him to discuss his recent URI symptoms with his surgical/anesthesia team to ensure that he is not at increased perioperative risk due to this.    Dispo: Return to clinic in 4 to 6 weeks.  Signed, Yvonne Kendall, MD

## 2024-02-07 NOTE — Patient Instructions (Signed)
 Medication Instructions:  Your physician recommends the following medication changes.  START TAKING: Aspirin 81 mg by mouth daily  *If you need a refill on your cardiac medications before your next appointment, please call your pharmacy*   Lab Work: No labs ordered today    Testing/Procedures: No test ordered today    Follow-Up: At [facility], you and your health needs are our priority.  As part of our continuing mission to provide you with exceptional heart care, we have created designated Provider Care Teams.  These Care Teams include your primary Cardiologist (physician) and Advanced Practice Providers (APPs -  Physician Assistants and Nurse Practitioners) who all work together to provide you with the care you need, when you need it.  We recommend signing up for the patient portal called "MyChart".  Sign up information is provided on this After Visit Summary.  MyChart is used to connect with patients for Virtual Visits (Telemedicine).  Patients are able to view lab/test results, encounter notes, upcoming appointments, etc.  Non-urgent messages can be sent to your provider as well.   To learn more about what you can do with MyChart, go to [email or web address].    Your next appointment:   4-6 week(s)  Provider:   You may see Yvonne Kendall, MD or one of the following Advanced Practice Providers on your designated Care Team:   Nicolasa Ducking, NP Eula Listen, PA-C Cadence Fransico Michael, PA-C Charlsie Quest, NP Carlos Levering, NP

## 2024-02-08 ENCOUNTER — Ambulatory Visit
Admission: RE | Admit: 2024-02-08 | Discharge: 2024-02-08 | Disposition: A | Discharge status: Home / Self Care | Payer: Medicare Other | Attending: General Surgery | Admitting: General Surgery

## 2024-02-08 ENCOUNTER — Encounter
Admission: RE | Disposition: A | Discharge status: Home / Self Care | Payer: Self-pay | Source: Home / Self Care | Attending: General Surgery

## 2024-02-08 ENCOUNTER — Other Ambulatory Visit: Payer: Self-pay

## 2024-02-08 ENCOUNTER — Ambulatory Visit: Payer: Medicare Other | Admitting: Urgent Care

## 2024-02-08 ENCOUNTER — Encounter: Payer: Self-pay | Admitting: General Surgery

## 2024-02-08 DIAGNOSIS — Z955 Presence of coronary angioplasty implant and graft: Secondary | ICD-10-CM | POA: Diagnosis not present

## 2024-02-08 DIAGNOSIS — I2510 Atherosclerotic heart disease of native coronary artery without angina pectoris: Secondary | ICD-10-CM | POA: Diagnosis not present

## 2024-02-08 DIAGNOSIS — K219 Gastro-esophageal reflux disease without esophagitis: Secondary | ICD-10-CM | POA: Diagnosis not present

## 2024-02-08 DIAGNOSIS — I10 Essential (primary) hypertension: Secondary | ICD-10-CM | POA: Diagnosis not present

## 2024-02-08 DIAGNOSIS — I739 Peripheral vascular disease, unspecified: Secondary | ICD-10-CM | POA: Diagnosis not present

## 2024-02-08 DIAGNOSIS — F419 Anxiety disorder, unspecified: Secondary | ICD-10-CM | POA: Insufficient documentation

## 2024-02-08 DIAGNOSIS — I5032 Chronic diastolic (congestive) heart failure: Secondary | ICD-10-CM | POA: Diagnosis not present

## 2024-02-08 DIAGNOSIS — Z9081 Acquired absence of spleen: Secondary | ICD-10-CM | POA: Diagnosis not present

## 2024-02-08 DIAGNOSIS — I110 Hypertensive heart disease with heart failure: Secondary | ICD-10-CM | POA: Diagnosis not present

## 2024-02-08 DIAGNOSIS — Z7902 Long term (current) use of antithrombotics/antiplatelets: Secondary | ICD-10-CM | POA: Diagnosis not present

## 2024-02-08 DIAGNOSIS — D7589 Other specified diseases of blood and blood-forming organs: Secondary | ICD-10-CM | POA: Diagnosis not present

## 2024-02-08 DIAGNOSIS — R7303 Prediabetes: Secondary | ICD-10-CM | POA: Insufficient documentation

## 2024-02-08 DIAGNOSIS — D479 Neoplasm of uncertain behavior of lymphoid, hematopoietic and related tissue, unspecified: Secondary | ICD-10-CM | POA: Diagnosis not present

## 2024-02-08 DIAGNOSIS — G4730 Sleep apnea, unspecified: Secondary | ICD-10-CM | POA: Insufficient documentation

## 2024-02-08 DIAGNOSIS — Z860100 Personal history of colon polyps, unspecified: Secondary | ICD-10-CM | POA: Diagnosis not present

## 2024-02-08 DIAGNOSIS — R591 Generalized enlarged lymph nodes: Secondary | ICD-10-CM | POA: Diagnosis not present

## 2024-02-08 DIAGNOSIS — C884 Extranodal marginal zone b-cell lymphoma of mucosa-associated lymphoid tissue (malt-lymphoma) not having achieved remission: Secondary | ICD-10-CM | POA: Diagnosis not present

## 2024-02-08 DIAGNOSIS — R590 Localized enlarged lymph nodes: Secondary | ICD-10-CM | POA: Diagnosis present

## 2024-02-08 DIAGNOSIS — Z8572 Personal history of non-Hodgkin lymphomas: Secondary | ICD-10-CM | POA: Diagnosis not present

## 2024-02-08 DIAGNOSIS — E669 Obesity, unspecified: Secondary | ICD-10-CM | POA: Diagnosis not present

## 2024-02-08 HISTORY — PX: LYMPH NODE DISSECTION: SHX5087

## 2024-02-08 HISTORY — DX: Atherosclerosis of aorta: I70.0

## 2024-02-08 HISTORY — DX: Extranodal marginal zone b-cell lymphoma of mucosa-associated lymphoid tissue (malt-lymphoma) not having achieved remission: C88.40

## 2024-02-08 HISTORY — DX: Benign prostatic hyperplasia without lower urinary tract symptoms: N40.0

## 2024-02-08 HISTORY — DX: Emphysema, unspecified: J43.9

## 2024-02-08 SURGERY — LYMPH NODE DISSECTION
Anesthesia: General | Laterality: Left

## 2024-02-08 MED ORDER — ACETAMINOPHEN 10 MG/ML IV SOLN: 1000.0000 mg | Freq: Once | INTRAVENOUS | Status: DC | PRN

## 2024-02-08 MED ORDER — FENTANYL CITRATE (PF) 100 MCG/2ML IJ SOLN: 25.0000 ug | INTRAMUSCULAR | Status: DC | PRN

## 2024-02-08 MED ORDER — DEXAMETHASONE SODIUM PHOSPHATE 10 MG/ML IJ SOLN
INTRAMUSCULAR | Status: DC | PRN
Start: 2024-02-08 — End: 2024-02-08
  Administered 2024-02-08: 10 mg via INTRAVENOUS

## 2024-02-08 MED ORDER — ACETAMINOPHEN 10 MG/ML IV SOLN
INTRAVENOUS | Status: AC
  Filled 2024-02-08: qty 100

## 2024-02-08 MED ORDER — PHENYLEPHRINE HCL-NACL 20-0.9 MG/250ML-% IV SOLN
INTRAVENOUS | Status: AC
Start: 2024-02-08 — End: ?
  Filled 2024-02-08: qty 250

## 2024-02-08 MED ORDER — HEMOSTATIC AGENTS (NO CHARGE) OPTIME
TOPICAL | Status: DC | PRN
  Administered 2024-02-08: 1 via TOPICAL

## 2024-02-08 MED ORDER — EPHEDRINE SULFATE (PRESSORS) 50 MG/ML IJ SOLN
10.0000 mg | Freq: Once | INTRAMUSCULAR | Status: AC
  Administered 2024-02-08: 10 mg via INTRAVENOUS
  Filled 2024-02-08: qty 0.2

## 2024-02-08 MED ORDER — FENTANYL CITRATE (PF) 100 MCG/2ML IJ SOLN
INTRAMUSCULAR | Status: AC
  Filled 2024-02-08: qty 2

## 2024-02-08 MED ORDER — CHLORHEXIDINE GLUCONATE 0.12 % MT SOLN
OROMUCOSAL | Status: AC
  Filled 2024-02-08: qty 15

## 2024-02-08 MED ORDER — BUPIVACAINE-EPINEPHRINE (PF) 0.5% -1:200000 IJ SOLN
INTRAMUSCULAR | Status: DC | PRN
  Administered 2024-02-08: 6 mL via PERINEURAL

## 2024-02-08 MED ORDER — PHENYLEPHRINE HCL-NACL 20-0.9 MG/250ML-% IV SOLN
INTRAVENOUS | Status: DC | PRN
  Administered 2024-02-08: 50 ug/min via INTRAVENOUS

## 2024-02-08 MED ORDER — HYDROCODONE-ACETAMINOPHEN 5-325 MG PO TABS: 1.0000 | ORAL_TABLET | Freq: Four times a day (QID) | ORAL | 0 refills | Status: DC | PRN

## 2024-02-08 MED ORDER — FENTANYL CITRATE (PF) 100 MCG/2ML IJ SOLN
INTRAMUSCULAR | Status: DC | PRN
  Administered 2024-02-08 (×2): 50 ug via INTRAVENOUS

## 2024-02-08 MED ORDER — OXYCODONE HCL 5 MG/5ML PO SOLN: 5.0000 mg | Freq: Once | ORAL | Status: DC | PRN

## 2024-02-08 MED ORDER — VASOPRESSIN 20 UNIT/ML IV SOLN
INTRAVENOUS | Status: AC
  Filled 2024-02-08: qty 2

## 2024-02-08 MED ORDER — VASOPRESSIN 20 UNIT/ML IV SOLN
INTRAVENOUS | Status: DC | PRN
  Administered 2024-02-08 (×3): 1 [IU] via INTRAVENOUS

## 2024-02-08 MED ORDER — KETOROLAC TROMETHAMINE 30 MG/ML IJ SOLN
INTRAMUSCULAR | Status: DC | PRN
  Administered 2024-02-08: 30 mg via INTRAMUSCULAR

## 2024-02-08 MED ORDER — SUCCINYLCHOLINE CHLORIDE 200 MG/10ML IV SOSY
PREFILLED_SYRINGE | INTRAVENOUS | Status: DC | PRN
Start: 2024-02-08 — End: 2024-02-08
  Administered 2024-02-08: 100 mg via INTRAVENOUS

## 2024-02-08 MED ORDER — LACTATED RINGERS IV SOLN: INTRAVENOUS | Status: DC

## 2024-02-08 MED ORDER — ONDANSETRON HCL 4 MG/2ML IJ SOLN
INTRAMUSCULAR | Status: DC | PRN
  Administered 2024-02-08: 4 mg via INTRAVENOUS

## 2024-02-08 MED ORDER — ACETAMINOPHEN 10 MG/ML IV SOLN
INTRAVENOUS | Status: DC | PRN
  Administered 2024-02-08: 1000 mg via INTRAVENOUS

## 2024-02-08 MED ORDER — KETAMINE HCL 50 MG/5ML IJ SOSY
PREFILLED_SYRINGE | INTRAMUSCULAR | Status: DC | PRN
Start: 2024-02-08 — End: 2024-02-08
  Administered 2024-02-08: 20 mg via INTRAVENOUS
  Administered 2024-02-08: 10 mg via INTRAVENOUS

## 2024-02-08 MED ORDER — SODIUM CHLORIDE (PF) 0.9 % IJ SOLN
INTRAMUSCULAR | Status: AC
Start: 2024-02-08 — End: ?
  Filled 2024-02-08: qty 10

## 2024-02-08 MED ORDER — PROPOFOL 1000 MG/100ML IV EMUL
INTRAVENOUS | Status: AC
  Filled 2024-02-08: qty 100

## 2024-02-08 MED ORDER — LIDOCAINE HCL (CARDIAC) PF 100 MG/5ML IV SOSY
PREFILLED_SYRINGE | INTRAVENOUS | Status: DC | PRN
Start: 2024-02-08 — End: 2024-02-08
  Administered 2024-02-08 (×2): 60 mg via INTRAVENOUS

## 2024-02-08 MED ORDER — EPHEDRINE SULFATE (PRESSORS) 50 MG/ML IJ SOLN
INTRAMUSCULAR | Status: AC
  Filled 2024-02-08: qty 1

## 2024-02-08 MED ORDER — KETAMINE HCL 50 MG/5ML IJ SOSY
PREFILLED_SYRINGE | INTRAMUSCULAR | Status: AC
  Filled 2024-02-08: qty 5

## 2024-02-08 MED ORDER — CEFAZOLIN SODIUM-DEXTROSE 2-4 GM/100ML-% IV SOLN
INTRAVENOUS | Status: AC
  Filled 2024-02-08: qty 100

## 2024-02-08 MED ORDER — ONDANSETRON HCL 4 MG/2ML IJ SOLN: 4.0000 mg | Freq: Once | INTRAMUSCULAR | Status: DC | PRN

## 2024-02-08 MED ORDER — PROPOFOL 10 MG/ML IV BOLUS
INTRAVENOUS | Status: DC | PRN
  Administered 2024-02-08: 200 mg via INTRAVENOUS

## 2024-02-08 MED ORDER — OXYCODONE HCL 5 MG PO TABS: 5.0000 mg | ORAL_TABLET | Freq: Once | ORAL | Status: DC | PRN

## 2024-02-08 MED ORDER — BUPIVACAINE-EPINEPHRINE (PF) 0.5% -1:200000 IJ SOLN
INTRAMUSCULAR | Status: AC
  Filled 2024-02-08: qty 30

## 2024-02-08 SURGICAL SUPPLY — 26 items
BLADE SURG 15 STRL LF DISP TIS (BLADE) ×1 IMPLANT
CHLORAPREP W/TINT 26 (MISCELLANEOUS) IMPLANT
DERMABOND ADVANCED .7 DNX12 (GAUZE/BANDAGES/DRESSINGS) ×1 IMPLANT
DRAIN CHANNEL JP 15F RND 3/16 (MISCELLANEOUS) IMPLANT
DRAPE LAPAROTOMY 100X77 ABD (DRAPES) ×1 IMPLANT
ELECT REM PT RETURN 9FT ADLT (ELECTROSURGICAL) ×1 IMPLANT
ELECTRODE REM PT RTRN 9FT ADLT (ELECTROSURGICAL) ×1 IMPLANT
EVACUATOR SILICONE 100CC (DRAIN) IMPLANT
GAUZE 4X4 16PLY ~~LOC~~+RFID DBL (SPONGE) IMPLANT
GAUZE SPONGE 4X4 12PLY STRL (GAUZE/BANDAGES/DRESSINGS) IMPLANT
GLOVE BIO SURGEON STRL SZ 6.5 (GLOVE) ×1 IMPLANT
GLOVE BIOGEL PI IND STRL 6.5 (GLOVE) ×1 IMPLANT
GOWN STRL REUS W/ TWL LRG LVL3 (GOWN DISPOSABLE) ×2 IMPLANT
HEMOSTAT ARISTA ABSORB 1G (HEMOSTASIS) IMPLANT
KIT TURNOVER KIT A (KITS) ×1 IMPLANT
LABEL OR SOLS (LABEL) ×1 IMPLANT
MANIFOLD NEPTUNE II (INSTRUMENTS) ×1 IMPLANT
PACK BASIN MINOR ARMC (MISCELLANEOUS) ×1 IMPLANT
SHEARS HARMONIC 9CM CVD (BLADE) IMPLANT
SUT ETHILON 3-0 FS-10 30 BLK (SUTURE) IMPLANT
SUT MNCRL 4-0 27 PS-2 XMFL (SUTURE) ×1 IMPLANT
SUT VIC AB 3-0 SH 27X BRD (SUTURE) ×1 IMPLANT
SUTURE EHLN 3-0 FS-10 30 BLK (SUTURE) IMPLANT
SUTURE MNCRL 4-0 27XMF (SUTURE) ×1 IMPLANT
TRAP FLUID SMOKE EVACUATOR (MISCELLANEOUS) ×1 IMPLANT
WATER STERILE IRR 500ML POUR (IV SOLUTION) ×1 IMPLANT

## 2024-02-08 NOTE — Telephone Encounter (Signed)
 Refused Plavix 75 mg because this is a duplicate request.    Been sent and received to the [facility] #[id number] 02/05/2024.

## 2024-02-08 NOTE — Anesthesia Preprocedure Evaluation (Addendum)
 Anesthesia Evaluation  Patient identified by MRN, date of birth, ID band Patient awake    Reviewed: Allergy & Precautions, NPO status , Patient's Chart, lab work & pertinent test results  History of Anesthesia Complications Negative for: history of anesthetic complications  Airway Mallampati: II   Neck ROM: Full    Dental no notable dental hx.    Pulmonary sleep apnea    Pulmonary exam normal breath sounds clear to auscultation       Cardiovascular hypertension, + CAD (s/p stents on Plavix), + Peripheral Vascular Disease and +CHF (preserved EF)  Normal cardiovascular exam Rhythm:Regular Rate:Normal  ECG 02/07/24:  Normal sinus rhythm Nonspecific ST and T wave abnormality  Echo 07/16/23: Normal LV size and wall thickness.  LVEF 60-65% with normal wall motion and diastolic parameters.  Normal RV size and function.  Normal biatrial size.  No pericardial effusion.  Mild mitral regurgitation.  Otherwise, no significant valvular abnormalities.  Borderline dilation of the ascending aorta, measuring 3.8 cm.   Neuro/Psych  PSYCHIATRIC DISORDERS Anxiety Depression    negative neurological ROS     GI/Hepatic hiatal hernia, PUD,GERD (Barrett esophagus)  ,,  Endo/Other  Prediabetes; obesity  Renal/GU negative Renal ROS     Musculoskeletal Gout    Abdominal   Peds  Hematology S/p splenectomy; MALT lymphoma   Anesthesia Other Findings Reviewed and agree with Edd Fabian pre-anesthesia clinical review note.    Cardiology note 02/07/24:  Coronary artery disease without angina and hyperlipidemia: Mr. Watkinson has not had any angina though I am concerned that his clopidogrel has been held for the last 6 days without any antiplatelet therapy still being taken in anticipation of his lymph node dissection tomorrow.  His EKG shows nonspecific ST/T abnormalities that are new compared to the tracing just a few days ago, though they have been  present on more remote EKGs.  As cessation of clopidogrel was made without our recommendation, I have administered aspirin 324 mg x 1 in the office today and advised Mr. Renteria to continue taking aspirin 81 mg daily until his surgical team feels it is safe for him to resume clopidogrel.  I have advised him to seek immediate medical attention where he to develop any chest pain or shortness of breath.  Continue atorvastatin 40 mg daily for secondary prevention.   Hypertension: Blood pressure low normal today but typically a bit higher.  We will plan to continue current regimen of amlodipine, losartan, and metoprolol.   Chronic HFpEF: Mr. Chestnut appears euvolemic with stable NYHA class I-2 symptoms.  Defer medication changes at this time.   Lymphoma: Mr. Oboyle has a history of MALT lymphoma but has noticed left axillary and cervical lymphadenopathy for which he is scheduled to undergo a lymph node dissection tomorrow.  He also endorses some recent diaphoresis that is concerning for a B symptom.  I think it is reasonable for him to proceed with this low risk procedure from a cardiovascular standpoint, though given his history of multivessel PCI in 2020, I have asked him to take aspirin while clopidogrel is on hold.  I advised him to discuss his recent URI symptoms with his surgical/anesthesia team to ensure that he is not at increased perioperative risk due to this.    Dispo: Return to clinic in 4 to 6 weeks.   Reproductive/Obstetrics                             Anesthesia  Physical Anesthesia Plan  ASA: 3  Anesthesia Plan: General   Post-op Pain Management:    Induction: Intravenous  PONV Risk Score and Plan: 2 and Ondansetron, Dexamethasone and Treatment may vary due to age or medical condition  Airway Management Planned: Oral ETT  Additional Equipment:   Intra-op Plan:   Post-operative Plan: Extubation in OR  Informed Consent: I have reviewed the patients  History and Physical, chart, labs and discussed the procedure including the risks, benefits and alternatives for the proposed anesthesia with the patient or authorized representative who has indicated his/her understanding and acceptance.     Dental advisory given  Plan Discussed with: CRNA  Anesthesia Plan Comments: (Patient consented for risks of anesthesia including but not limited to:  - adverse reactions to medications - damage to eyes, teeth, lips or other oral mucosa - nerve damage due to positioning  - sore throat or hoarseness - damage to heart, brain, nerves, lungs, other parts of body or loss of life  Informed patient about role of CRNA in peri- and intra-operative care.  Patient voiced understanding.)        Anesthesia Quick Evaluation

## 2024-02-08 NOTE — Anesthesia Postprocedure Evaluation (Signed)
 Anesthesia Post Note  Patient: Jesse Norton  Procedure(s) Performed: LYMPH NODE DISSECTION (Left)  Patient location during evaluation: PACU Anesthesia Type: General Level of consciousness: awake and alert, oriented and patient cooperative Pain management: pain level controlled Vital Signs Assessment: post-procedure vital signs reviewed and stable Respiratory status: spontaneous breathing, nonlabored ventilation and respiratory function stable Cardiovascular status: blood pressure returned to baseline and stable Postop Assessment: adequate PO intake Anesthetic complications: no   There were no known notable events for this encounter.   Last Vitals:  Vitals:   02/08/24 1145 02/08/24 1152  BP: (!) 87/59 (!) 91/53  Pulse: 73 74  Resp: (!) 21 (!) 21  Temp:    SpO2: 90% 92%    Last Pain:  Vitals:   02/08/24 1115  TempSrc:   PainSc: 0-No pain                 Reed Breech

## 2024-02-08 NOTE — Transfer of Care (Signed)
 Immediate Anesthesia Transfer of Care Note  Patient: Jesse Norton  Procedure(s) Performed: LYMPH NODE DISSECTION (Left)  Patient Location: PACU  Anesthesia Type:General  Level of Consciousness: awake and alert   Airway & Oxygen Therapy: Patient Spontanous Breathing and Patient connected to face mask oxygen  Post-op Assessment: Report given to RN and Post -op Vital signs reviewed and stable  Post vital signs: stable  Last Vitals:  Vitals Value Taken Time  BP 101/67 02/08/24 1045  Temp 36.1 C 02/08/24 1044  Pulse 75 02/08/24 1046  Resp 27 02/08/24 1046  SpO2 99 % 02/08/24 1046  Vitals shown include unfiled device data.  Last Pain:  Vitals:   02/08/24 0916  TempSrc: Temporal  PainSc: 0-No pain         Complications: No notable events documented.

## 2024-02-08 NOTE — Interval H&P Note (Signed)
 History and Physical Interval Note:  02/08/2024 9:28 AM  Jesse Norton  has presented today for surgery, with the diagnosis of R59.1 lymphadenopathy, generalized.  The various methods of treatment have been discussed with the patient and family. After consideration of risks, benefits and other options for treatment, the patient has consented to  Procedure(s): LYMPH NODE DISSECTION (Left) as a surgical intervention.  The patient's history has been reviewed, patient examined, no change in status, stable for surgery.  I have reviewed the patient's chart and labs.  Questions were answered to the patient's satisfaction.     Carolan Shiver

## 2024-02-08 NOTE — Discharge Instructions (Signed)
   Diet: Resume home heart healthy regular diet.   Activity: Increase activity as tolerated. Light activity and walking are encouraged. Do not drive or drink alcohol if taking narcotic pain medications.  Wound care: May shower with soapy water and pat dry (do not rub incisions), but no baths or submerging incision underwater until follow-up. (no swimming)   Medications: Resume all home medications. For mild to moderate pain: acetaminophen (Tylenol) or ibuprofen (if no kidney disease). Combining Tylenol with alcohol can substantially increase your risk of causing liver disease. Narcotic pain medications, if prescribed, can be used for severe pain, though may cause nausea, constipation, and drowsiness. Do not combine Tylenol and Norco within a 6 hour period as Norco contains Tylenol. If you do not need the narcotic pain medication, you do not need to fill the prescription.  Call office [phone]) at any time if any questions, worsening pain, fevers/chills, bleeding, drainage from incision site, or other concerns.

## 2024-02-08 NOTE — Anesthesia Procedure Notes (Signed)
 Procedure Name: Intubation Date/Time: 02/08/2024 10:06 AM  Performed by: Maryla Morrow., CRNAPre-anesthesia Checklist: Patient identified, Patient being monitored, Timeout performed, Emergency Drugs available and Suction available Patient Re-evaluated:Patient Re-evaluated prior to induction Oxygen Delivery Method: Circle system utilized Preoxygenation: Pre-oxygenation with 100% oxygen Induction Type: IV induction and Rapid sequence Ventilation: Mask ventilation without difficulty Laryngoscope Size: McGrath and 4 Grade View: Grade I Tube type: Oral Tube size: 7.5 mm Number of attempts: 1 Airway Equipment and Method: Stylet Placement Confirmation: ETT inserted through vocal cords under direct vision, positive ETCO2 and breath sounds checked- equal and bilateral Secured at: 21 cm Tube secured with: Tape Dental Injury: Teeth and Oropharynx as per pre-operative assessment

## 2024-02-08 NOTE — Op Note (Signed)
 Preoperative diagnosis: Left axillary lymphadenopathy.  Postoperative diagnosis: Same.   Procedure: Left Axillary excisional lymph node biopsy  Anesthesia: GETA  Surgeon: Dr. Hazle Quant  Wound Classification: Clean  Indications: Patient is a 78 y.o. male with a severe left neck and axillary lymphadenopathy. Lymph node biopsy needed for diagnosis of lymphoma.   Findings: 1. Abundant amount of lymph nodes palpable on left axilla and neck.   Description of procedure: The patient was taken to the operating room and placed supine on the operating table, and after general anesthesia the left axilla was prepped and draped in the usual sterile fashion. A time-out was completed verifying correct patient, procedure, site, positioning, and implant(s) and/or special equipment prior to beginning this procedure.   An incision was made around the caudal axillary hairline. Dissection was carried down until subdermal facias was advanced. Dissection continue deep until large nodule was identified. The node was excised in its entirety.  The procedure was terminated. Hemostasis was achieved and the wound closed in layers with deep interrupted 3-0 Vicryl and skin was closed with subcuticular suture of Monocryl 3-0.  The patient tolerated the procedure well and was taken to the postanesthesia care unit in stable condition.   Specimen: Left axillary lymph nodes  Complications: None  Estimated Blood Loss: 5 mL

## 2024-02-09 ENCOUNTER — Encounter: Payer: Self-pay | Admitting: General Surgery

## 2024-02-11 ENCOUNTER — Other Ambulatory Visit: Payer: Self-pay

## 2024-02-11 ENCOUNTER — Inpatient Hospital Stay: Payer: Medicare Other | Attending: Nurse Practitioner | Admitting: Internal Medicine

## 2024-02-11 ENCOUNTER — Ambulatory Visit: Payer: Self-pay | Admitting: General Surgery

## 2024-02-11 ENCOUNTER — Inpatient Hospital Stay: Payer: Medicare Other

## 2024-02-11 ENCOUNTER — Other Ambulatory Visit: Payer: Self-pay | Admitting: Nurse Practitioner

## 2024-02-11 ENCOUNTER — Encounter: Payer: Self-pay | Admitting: Internal Medicine

## 2024-02-11 VITALS — BP 116/72 | HR 97 | Temp 99.2°F | Resp 16 | Wt 235.0 lb

## 2024-02-11 DIAGNOSIS — C8338 Diffuse large B-cell lymphoma, lymph nodes of multiple sites: Secondary | ICD-10-CM | POA: Diagnosis not present

## 2024-02-11 DIAGNOSIS — Z9081 Acquired absence of spleen: Secondary | ICD-10-CM | POA: Insufficient documentation

## 2024-02-11 DIAGNOSIS — Z01810 Encounter for preprocedural cardiovascular examination: Secondary | ICD-10-CM

## 2024-02-11 DIAGNOSIS — I10 Essential (primary) hypertension: Secondary | ICD-10-CM | POA: Diagnosis not present

## 2024-02-11 DIAGNOSIS — I959 Hypotension, unspecified: Secondary | ICD-10-CM | POA: Diagnosis not present

## 2024-02-11 DIAGNOSIS — C884 Extranodal marginal zone b-cell lymphoma of mucosa-associated lymphoid tissue (malt-lymphoma) not having achieved remission: Secondary | ICD-10-CM | POA: Diagnosis not present

## 2024-02-11 DIAGNOSIS — K2270 Barrett's esophagus without dysplasia: Secondary | ICD-10-CM | POA: Diagnosis not present

## 2024-02-11 LAB — CMP (CANCER CENTER ONLY)
ALT: 31 U/L (ref 0–44)
AST: 48 U/L — ABNORMAL HIGH (ref 15–41)
Albumin: 3.1 g/dL — ABNORMAL LOW (ref 3.5–5.0)
Alkaline Phosphatase: 52 U/L (ref 38–126)
Anion gap: 12 (ref 5–15)
BUN: 19 mg/dL (ref 8–23)
CO2: 24 mmol/L (ref 22–32)
Calcium: 8.7 mg/dL — ABNORMAL LOW (ref 8.9–10.3)
Chloride: 99 mmol/L (ref 98–111)
Creatinine: 1.06 mg/dL (ref 0.61–1.24)
GFR, Estimated: >60 mL/min (ref >60)
Glucose, Bld: 137 mg/dL — ABNORMAL HIGH (ref 70–99)
Potassium: 3.8 mmol/L (ref 3.5–5.1)
Sodium: 135 mmol/L (ref 135–145)
Total Bilirubin: 0.8 mg/dL (ref 0.0–1.2)
Total Protein: 6.4 g/dL — ABNORMAL LOW (ref 6.5–8.1)

## 2024-02-11 LAB — CBC WITH DIFFERENTIAL (CANCER CENTER ONLY)
Abs Immature Granulocytes: 0.08 10*3/uL — ABNORMAL HIGH (ref 0.00–0.07)
Basophils Absolute: 0.1 10*3/uL (ref 0.0–0.1)
Basophils Relative: 1 %
Eosinophils Absolute: 0.2 10*3/uL (ref 0.0–0.5)
Eosinophils Relative: 3 %
HCT: 29.1 % — ABNORMAL LOW (ref 39.0–52.0)
Hemoglobin: 9.6 g/dL — ABNORMAL LOW (ref 13.0–17.0)
Immature Granulocytes: 1 %
Lymphocytes Relative: 17 %
Lymphs Abs: 1.4 10*3/uL (ref 0.7–4.0)
MCH: 35 pg — ABNORMAL HIGH (ref 26.0–34.0)
MCHC: 33 g/dL (ref 30.0–36.0)
MCV: 106.2 fL — ABNORMAL HIGH (ref 80.0–100.0)
Monocytes Absolute: 0.7 10*3/uL (ref 0.1–1.0)
Monocytes Relative: 9 %
Neutro Abs: 5.5 10*3/uL (ref 1.7–7.7)
Neutrophils Relative %: 69 %
Platelet Count: 472 10*3/uL — ABNORMAL HIGH (ref 150–400)
RBC: 2.74 MIL/uL — ABNORMAL LOW (ref 4.22–5.81)
RDW: 13.5 % (ref 11.5–15.5)
WBC Count: 8 10*3/uL (ref 4.0–10.5)
nRBC: 0.5 % — ABNORMAL HIGH (ref 0.0–0.2)

## 2024-02-11 LAB — SAMPLE TO BLOOD BANK

## 2024-02-11 LAB — URIC ACID: Uric Acid, Serum: 5.2 mg/dL (ref 3.7–8.6)

## 2024-02-11 MED ORDER — HYDROCODONE-ACETAMINOPHEN 5-325 MG PO TABS
1.0000 | ORAL_TABLET | Freq: Four times a day (QID) | ORAL | 0 refills | Status: DC | PRN
Start: 2024-02-11 — End: 2024-02-26

## 2024-02-11 MED ORDER — ONDANSETRON HCL 8 MG PO TABS: ORAL_TABLET | ORAL | 1 refills | Status: DC

## 2024-02-11 MED ORDER — ALLOPURINOL 300 MG PO TABS: 300.0000 mg | ORAL_TABLET | Freq: Two times a day (BID) | ORAL | 1 refills | Status: DC

## 2024-02-11 MED ORDER — PROCHLORPERAZINE MALEATE 10 MG PO TABS: 10.0000 mg | ORAL_TABLET | Freq: Four times a day (QID) | ORAL | 1 refills | Status: DC | PRN

## 2024-02-11 MED ORDER — PREDNISONE 50 MG PO TABS
ORAL_TABLET | ORAL | 6 refills | Status: DC
Start: 2024-02-11 — End: 2024-02-19

## 2024-02-11 MED ORDER — ONETOUCH ULTRASOFT LANCETS MISC: 12 refills | Status: DC

## 2024-02-11 MED ORDER — LIDOCAINE-PRILOCAINE 2.5-2.5 % EX CREA: TOPICAL_CREAM | CUTANEOUS | 3 refills | Status: DC

## 2024-02-11 MED ORDER — GLUCOSE BLOOD VI STRP: ORAL_STRIP | 2 refills | Status: DC

## 2024-02-11 MED ORDER — ONETOUCH VERIO W/DEVICE KIT: PACK | 2 refills | Status: DC

## 2024-02-11 NOTE — Progress Notes (Unsigned)
 Patient is still having a sore throat, ongoing for about a month now, he says it has worsened. He had PET Scan on 02/05/2024, and CT scan on 01/31/2024. He has 2 Hydrocodone left and feels like he might need more from when he had his surgery.

## 2024-02-11 NOTE — Progress Notes (Unsigned)
 [facility] CONSULT NOTE  Patient Care Team: Marjie Skiff, NP as PCP - General (Nurse Practitioner) End, Cristal Deer, MD as PCP - Cardiology (Cardiology) Minor, Theadora Rama, RN (Inactive) as [facility] Care Management Donneta Romberg, Worthy Flank, MD as Consulting Physician (Oncology) Jaynie Collins, DO as Consulting Physician (Gastroenterology)  CHIEF COMPLAINTS/PURPOSE OF CONSULTATION: Lymphoma  #  Oncology History Overview Note  # NOV 2019- MALT [II opinion at [facility] path]; s/p sigmoid colon polyp resection [Dr.Skulskie; incidental]; PET scan -NED  # Barrett's esophagus- 2018;#Colonic polyps surveillance/Dr. 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  Ambulating dependently.  Jesse Norton 78 y.o.  male is here for follow-up of his MALT lymphoma of the colon/polyp status post resection-; recent worsening neck adenopathy/and abdominal adenopathy-is here today with a PET scan.  Patient had a biopsy 3 days ago.  Complains of mild soreness in his underarm.  He has 2 Hydrocodone left and feels like he might need more from when he had his surgery   Patient continues to have swelling and pain of his throat.  Complains of poor appetite. Patient lost about 20 pounds.  Noticed to have night sweats, not drenching.    Review of Systems  Constitutional:  Positive for malaise/fatigue. Negative for chills, diaphoresis, fever and weight loss.  HENT:  Negative for nosebleeds and sore throat.   Eyes:  Negative for double vision.  Respiratory:  Negative for cough, hemoptysis, sputum production, shortness of breath and wheezing.   Cardiovascular:  Negative for chest pain, palpitations, orthopnea and leg swelling.  Gastrointestinal:  Negative for abdominal pain, blood in stool, constipation, diarrhea, heartburn, melena, nausea and vomiting.  Genitourinary:  Negative for dysuria, frequency and urgency.  Musculoskeletal:  Negative for back pain and joint pain.  Skin: Negative.  Negative for itching and rash.  Neurological:  Negative for dizziness, tingling, focal weakness, weakness and headaches.  Endo/Heme/Allergies:  Does not bruise/bleed easily.  Psychiatric/Behavioral:  Negative for depression. The patient is not nervous/anxious and does not have insomnia.      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   a.) TTE 07/16/2023: 38 mm   Barrett's esophagus    BPH (benign prostatic hyperplasia)    CAD (coronary artery disease) 10/2018   a.) ETT 11/06/2018: 1mm horizontal inflat ST depression, freq PVC's -> intermediate risk; b.) cCTA 12/23/2018: Ca2+ 2408 (93rd %'ile); FFR 0.6 mLAD, 0.87 mLCx, 0.77 dLCx --> cath recommended; c.) LHC/PCI 01/01/2019:  40% pLAD, 85% mLAD, 70% D2, 70%  pLCx (2.75 x 15 mm Resolute Onyx DES), RCA min irregs -- staged PCI; d.) staged PCI 01/21/2019: orbital athrectomy + 3.0 x 15 mm Resolute Onyx DES mLAD   Chronic heart failure with preserved ejection fraction (HCC)    Colon polyps    Duodenal adenoma    Gastritis     GERD (gastroesophageal reflux disease)    Gout    H. pylori infection    H/O urethral stricture    Hemorrhoids    Hiatal hernia    History of bilateral cataract extraction 2023   History of splenectomy 1957   Hypertension    Mucosa-associated lymphoid tissue (MALT) lymphoma    Pre-diabetes    Pulmonary emphysema (HCC)    Stomach ulcer     SURGICAL HISTORY: Past Surgical History:  Procedure Laterality Date   CATARACT EXTRACTION W/PHACO Left 03/07/2022   Procedure: CATARACT EXTRACTION PHACO AND INTRAOCULAR LENS PLACEMENT (IOC) LEFT VIVITY TORIC LENS 10.41 00:55.7;  Surgeon: Galen Manila, MD;  Location: [facility];  Service: Ophthalmology;  Laterality: Left;   CATARACT EXTRACTION W/PHACO Right 03/28/2022   Procedure: CATARACT EXTRACTION PHACO AND INTRAOCULAR LENS PLACEMENT (IOC) RIGHT VIVITY LENS 6.68 00:50.0;  Surgeon: Galen Manila, MD;  Location: [facility];  Service: Ophthalmology;  Laterality: Right;   COLONOSCOPY     COLONOSCOPY N/A 06/19/2022   Procedure: COLONOSCOPY;  Surgeon: Jaynie Collins, DO;  Location: [facility] ENDOSCOPY;  Service: Gastroenterology;  Laterality: N/A;   COLONOSCOPY WITH PROPOFOL N/A 08/20/2018   Procedure: COLONOSCOPY WITH PROPOFOL;  Surgeon: Christena Deem, MD;  Location: [facility] ENDOSCOPY;  Service: Endoscopy;  Laterality: N/A;   COLONOSCOPY WITH PROPOFOL N/A 12/19/2018   Procedure: COLONOSCOPY WITH PROPOFOL;  Surgeon: Christena Deem, MD;  Location: [facility] ENDOSCOPY;  Service: Endoscopy;  Laterality: N/A;   CORONARY ATHERECTOMY N/A 02/03/2019   Procedure: CORONARY ATHERECTOMY;  Surgeon: Yvonne Kendall, MD;  Location: [facility] INVASIVE CV LAB;  Service: Cardiovascular;  Laterality: N/A;   CORONARY STENT INTERVENTION N/A 01/21/2019   Procedure: CORONARY STENT INTERVENTION;  Surgeon: Yvonne Kendall, MD;  Location: [facility] INVASIVE CV LAB;  Service: Cardiovascular;  Laterality: N/A;   CORONARY STENT INTERVENTION N/A 02/03/2019    Procedure: CORONARY STENT INTERVENTION;  Surgeon: Yvonne Kendall, MD;  Location: [facility] INVASIVE CV LAB;  Service: Cardiovascular;  Laterality: N/A;   CORONARY ULTRASOUND/IVUS N/A 02/03/2019   Procedure: Intravascular Ultrasound/IVUS;  Surgeon: Yvonne Kendall, MD;  Location: [facility] INVASIVE CV LAB;  Service: Cardiovascular;  Laterality: N/A;   CYSTOSCOPY WITH URETHRAL DILATATION N/A 08/16/2018   Procedure: CYSTOSCOPY WITH URETHRAL DILATATION;  Surgeon: Sondra Come, MD;  Location: [facility] ORS;  Service: Urology;  Laterality: N/A;   ESOPHAGOGASTRODUODENOSCOPY N/A 01/29/2017   Procedure: ESOPHAGOGASTRODUODENOSCOPY (EGD);  Surgeon: Christena Deem, MD;  Location: [facility] ENDOSCOPY;  Service: Endoscopy;  Laterality: N/A;   ESOPHAGOGASTRODUODENOSCOPY N/A 06/19/2022   Procedure: ESOPHAGOGASTRODUODENOSCOPY (EGD);  Surgeon: Jaynie Collins, DO;  Location: [facility] ENDOSCOPY;  Service: Gastroenterology;  Laterality: N/A;   ESOPHAGOGASTRODUODENOSCOPY (EGD) WITH PROPOFOL N/A 07/10/2016   Procedure: ESOPHAGOGASTRODUODENOSCOPY (EGD) WITH PROPOFOL;  Surgeon: Christena Deem, MD;  Location: [facility] ENDOSCOPY;  Service: Endoscopy;  Laterality: N/A;   ESOPHAGOGASTRODUODENOSCOPY (EGD) WITH PROPOFOL N/A 12/19/2018   Procedure: ESOPHAGOGASTRODUODENOSCOPY (EGD) WITH PROPOFOL;  Surgeon: Christena Deem, MD;  Location: [facility] ENDOSCOPY;  Service: Endoscopy;  Laterality: N/A;   left elbow repair Left    LEFT HEART CATH AND CORONARY ANGIOGRAPHY Left 01/21/2019   Procedure: LEFT HEART CATH AND CORONARY ANGIOGRAPHY;  Surgeon: Yvonne Kendall, MD;  Location: [facility] INVASIVE CV LAB;  Service: Cardiovascular;  Laterality: Left;   LYMPH NODE DISSECTION Left 02/08/2024   Procedure: LYMPH NODE DISSECTION;  Surgeon: Carolan Shiver, MD;  Location: [facility] ORS;  Service: General;  Laterality: Left;   SPLENECTOMY  1957   TONSILLECTOMY     as a child    SOCIAL HISTORY: Social History   Socioeconomic History   Marital  status: Married    Spouse name: Marylu Lund   Number of children: 2   Years of education: Not on file   Highest education level: Master's degree (e.g., MA, MS, MEng, MEd, MSW, MBA)  Occupational History   Occupation: retired     Comment: Psychologist, occupational  Tobacco Use   Smoking status: Never   Smokeless tobacco: Never  Vaping Use   Vaping status: Never Used  Substance and Sexual Activity   Alcohol use: Not Currently   Drug use: No   Sexual activity: Yes  Other Topics Concern   Not on file  Social History Narrative   Married, 2 sons, 1 in [place], [place] and 1 in [employer], stationary bike      No smoking.  Occasional alcohol.  Lives at home.  Retired Psychologist, occupational.   Social Drivers of Corporate investment banker Strain: Low Risk  (01/29/2024)   Received from [facility]   Overall Financial Resource Strain (CARDIA)    Difficulty of Paying Living Expenses: Not hard at all  Food Insecurity: No Food Insecurity (01/29/2024)   Received from [facility]   Hunger Vital Sign    Worried About Running Out of Food in the Last Year: Never true    Ran Out of Food in the Last Year: Never true  Transportation Needs: No Transportation Needs (01/29/2024)   Received from [facility] - Transportation    In the past 12 months, has lack of transportation kept you from medical appointments or from getting medications?: No    Lack of Transportation (Non-Medical): No  Physical Activity: Insufficiently Active (07/17/2023)   Exercise Vital Sign    Days of Exercise per Week: 4 days    Minutes of Exercise per Session: 30 min  Stress: No Stress Concern Present (07/17/2023)   [employer] of Occupational Health - Occupational Stress Questionnaire    Feeling of Stress : Only a little  Social Connections: Moderately Integrated (07/17/2023)   Social Connection and Isolation Panel [NHANES]    Frequency of Communication with Friends and Family: Three times a  week    Frequency of Social Gatherings with Friends and Family: Three times a week    Attends Religious Services: More than 4 times per year    Active Member of Clubs or Organizations: No    Attends Banker Meetings: Never    Marital Status: Married  Catering manager Violence: Not At Risk (07/17/2023)   Humiliation, Afraid, Rape, and Kick questionnaire    Fear of Current or Ex-Partner: No    Emotionally Abused: No    Physically Abused: No    Sexually Abused: No    FAMILY HISTORY: Family History  Adopted: Yes  Problem Relation Age of Onset   Heart attack Mother 75   Pancreatic cancer Father        died in WWII - pt adopted   Hypertension Sister    Hyperlipidemia Sister    Hypertension Brother    Hyperlipidemia Brother    Prostate cancer Neg Hx  Kidney cancer Neg Hx    Bladder Cancer Neg Hx     ALLERGIES:  has no known allergies.  MEDICATIONS:  Current Outpatient Medications  Medication Sig Dispense Refill   allopurinol (ZYLOPRIM) 300 MG tablet Take 1 tablet (300 mg total) by mouth 2 (two) times daily. 120 tablet 1   glucose blood test strip To check blood sugar twice a day with goal fasting in morning <130 and goal 2 hours after meal <180.  Write all checks down for provider visits. 100 each 2   lidocaine-prilocaine (EMLA) cream Apply on the port. 30 -45 min  prior to port access. 30 g 3   ondansetron (ZOFRAN) 8 MG tablet One pill every 8 hours as needed for nausea/vomitting. 40 tablet 1   predniSONE (DELTASONE) 50 MG tablet Take 2 tablets once day x 5 days.  Take in AM; with FOOD. 10 tablet 6   prochlorperazine (COMPAZINE) 10 MG tablet Take 1 tablet (10 mg total) by mouth every 6 (six) hours as needed for nausea or vomiting. 40 tablet 1   amLODipine (NORVASC) 10 MG tablet TAKE 1 TABLET(10 MG) BY MOUTH DAILY 90 tablet 4   aspirin EC 81 MG tablet Take 1 tablet (81 mg total) by mouth daily. Swallow whole.     atorvastatin (LIPITOR) 40 MG tablet TAKE 1 TABLET(40  MG) BY MOUTH DAILY 90 tablet 4   Blood Glucose Monitoring Suppl (ONETOUCH VERIO) w/Device KIT To check blood sugar twice a day with goal fasting in morning <130 and goal 2 hours after meal <180.  Write all checks down for provider visits. 1 kit 2   clobetasol ointment (TEMOVATE) 0.05 % Apply topically as needed.     clopidogrel (PLAVIX) 75 MG tablet TAKE 1 TABLET(75 MG) BY MOUTH DAILY WITH BREAKFAST 90 tablet 0   furosemide (LASIX) 20 MG tablet Take 1 tablet (20 mg total) by mouth daily. 90 tablet 4   HYDROcodone-acetaminophen (NORCO/VICODIN) 5-325 MG tablet Take 1 tablet by mouth every 6 (six) hours as needed. 45 tablet 0   Lancets (ONETOUCH ULTRASOFT) lancets 1 each by Other route 2 (two) times daily. 100 each 12   losartan (COZAAR) 100 MG tablet TAKE 1 TABLET(100 MG) BY MOUTH DAILY 90 tablet 4   metoprolol tartrate (LOPRESSOR) 25 MG tablet Take 1 tablet (25 mg total) by mouth 2 (two) times daily. 180 tablet 4   nitroGLYCERIN (NITROSTAT) 0.4 MG SL tablet Place 1 tablet (0.4 mg total) under the tongue every 5 (five) minutes as needed for chest pain. Maximum of 3 doses. 25 tablet 3   OVER THE COUNTER MEDICATION AGI (athletic greens) powder form by mouth once daily     pantoprazole (PROTONIX) 20 MG tablet TAKE 1 TABLET(20 MG) BY MOUTH TWICE DAILY 180 tablet 4   No current facility-administered medications for this visit.   Facility-Administered Medications Ordered in Other Visits  Medication Dose Route Frequency Provider Last Rate Last Admin   acetaminophen (OFIRMEV) IV 1,000 mg  1,000 mg Intravenous Once PRN Reed Breech, MD       fentaNYL (SUBLIMAZE) injection 25-50 mcg  25-50 mcg Intravenous Q5 min PRN Reed Breech, MD       lactated ringers infusion   Intravenous Continuous Foye Deer, MD   Stopped at 02/13/24 1037   lactated ringers infusion   Intravenous Continuous Reed Breech, MD       ondansetron [facility]) injection 4 mg  4 mg Intravenous Once PRN Reed Breech, MD  oxyCODONE (Oxy IR/ROXICODONE) immediate release tablet 5 mg  5 mg Oral Once PRN Reed Breech, MD       Or   oxyCODONE (ROXICODONE) 5 MG/5ML solution 5 mg  5 mg Oral Once PRN Reed Breech, MD        PHYSICAL EXAMINATION: ECOG PERFORMANCE STATUS: 0 - Asymptomatic  Vitals:   02/11/24 0947  BP: 116/72  Pulse: 97  Resp: 16  Temp: 99.2 F (37.3 C)  SpO2: 98%    Filed Weights   02/11/24 0947  Weight: 235 lb (106.6 kg)   Bulky adenopathy in the neck and also in the underarms.  Physical Exam HENT:     Head: Normocephalic and atraumatic.     Mouth/Throat:     Pharynx: No oropharyngeal exudate.  Eyes:     Pupils: Pupils are equal, round, and reactive to light.  Cardiovascular:     Rate and Rhythm: Normal rate and regular rhythm.  Pulmonary:     Effort: No respiratory distress.     Breath sounds: No wheezing.  Abdominal:     General: Bowel sounds are normal. There is no distension.     Palpations: Abdomen is soft. There is no mass.     Tenderness: There is no abdominal tenderness. There is no guarding or rebound.  Musculoskeletal:        General: No tenderness. Normal range of motion.     Cervical back: Normal range of motion and neck supple.  Skin:    General: Skin is warm.  Neurological:     Mental Status: He is alert and oriented to person, place, and time.  Psychiatric:        Mood and Affect: Affect normal.      LABORATORY DATA:  I have reviewed the data as listed Lab Results  Component Value Date   WBC 8.0 02/11/2024   HGB 9.6 (L) 02/11/2024   HCT 29.1 (L) 02/11/2024   MCV 106.2 (H) 02/11/2024   PLT 472 (H) 02/11/2024   Recent Labs    07/27/23 1021 08/28/23 1131 01/18/24 1014 02/11/24 0922  NA 138 141 132* 135  K 3.7 4.1 4.1 3.8  CL 100 99 93* 99  CO2 27 27 26 24   GLUCOSE 105* 109* 105* 137*  BUN 16 12 24* 19  CREATININE 0.91 0.89 1.17 1.06  CALCIUM 9.6 9.6 8.8* 8.7*  GFRNONAA >60  --  >60 >60  PROT 7.5 7.2 7.4 6.4*  ALBUMIN 4.3 4.4  4.1 3.1*  AST 50* 51* 65* 48*  ALT 46* 52* 44 31  ALKPHOS 59 79 65 52  BILITOT 0.9 0.8 1.8* 0.8    RADIOGRAPHIC STUDIES: I have personally reviewed the radiological images as listed and agreed with the findings in the report. DG Chest Port 1 View Result Date: 02/13/2024 CLINICAL DATA:  Recurrent lymphoma, port catheter placement EXAM: PORTABLE CHEST 1 VIEW COMPARISON:  07/03/2018 FINDINGS: Right IJ power port catheter tip mid SVC level. No effusion or pneumothorax. Low lung volumes. Bibasilar atelectasis. Negative for edema. Mediastinal and hilar adenopathy better appreciated by comparison PET-CT. Aorta atherosclerotic. Trachea midline. IMPRESSION: 1. Right IJ port catheter tip mid SVC level. No pneumothorax. 2. Low lung volumes with bibasilar atelectasis. Electronically Signed   By: Judie Petit.  Shick M.D.   On: 02/13/2024 13:40   DG C-Arm 1-60 Min-No Report Result Date: 02/13/2024 Fluoroscopy was utilized by the requesting physician.  No radiographic interpretation.   ECHOCARDIOGRAM COMPLETE Result Date: 02/12/2024    ECHOCARDIOGRAM REPORT  Patient Name:   SEBASTYAN SNODGRASS Date of Exam: 02/12/2024 Medical Rec #:  [id number]        Height:       70.5 in Accession #:    [id number]       Weight:       235.0 lb Date of Birth:  [birth year]        BSA:          2.247 m Patient Age:    78 years         BP:           116/72 mmHg Patient Gender: M                HR:           86 bpm. Exam Location:  [place] Procedure: 2D Echo, Color Doppler, Cardiac Doppler and Intracardiac            Opacification Agent (Both Spectral and Color Flow Doppler were            utilized during procedure). Indications:    R06.9 DOE; Z51.11 Encounter for antineoplastic chemotheraphy  History:        Patient has prior history of Echocardiogram examinations, most                 recent 07/16/2023. CHF, CAD, Signs/Symptoms:Dyspnea and Shortness                 of Breath; Risk Factors:Hypertension, Dyslipidemia and                 Non-Smoker.   Sonographer:    Ilda Mori MHA, BS, RDCS Referring Phys: [phone] CHRISTOPHER END  Sonographer Comments: Technically challenging study due to limited acoustic windows and patient is obese. Image acquisition challenging due to patient body habitus. IMPRESSIONS  1. Left ventricular ejection fraction, by estimation, is 60 to 65%. The left ventricle has normal function. The left ventricle has no regional wall motion abnormalities. Left ventricular diastolic parameters are consistent with Grade I diastolic dysfunction (impaired relaxation).  2. Right ventricular systolic function is normal. The right ventricular size is normal. There is normal pulmonary artery systolic pressure. The estimated right ventricular systolic pressure is 21.8 mmHg.  3. Left atrial size was mildly dilated.  4. The mitral valve is normal in structure. No evidence of mitral valve regurgitation. No evidence of mitral stenosis.  5. The aortic valve is normal in structure. There is mild calcification of the aortic valve. Aortic valve regurgitation is not visualized. Aortic valve sclerosis is present, with no evidence of aortic valve stenosis.  6. The inferior vena cava is normal in size with greater than 50% respiratory variability, suggesting right atrial pressure of 3 mmHg. FINDINGS  Left Ventricle: Left ventricular ejection fraction, by estimation, is 60 to 65%. The left ventricle has normal function. The left ventricle has no regional wall motion abnormalities. Strain was performed and the global longitudinal strain is indeterminate. The left ventricular internal cavity size was normal in size. There is no left ventricular hypertrophy. Left ventricular diastolic parameters are consistent with Grade I diastolic dysfunction (impaired relaxation). Right Ventricle: The right ventricular size is normal. No increase in right ventricular wall thickness. Right ventricular systolic function is normal. There is normal pulmonary artery systolic pressure.  The tricuspid regurgitant velocity is 2.05 m/s, and  with an assumed right atrial pressure of 5 mmHg, the estimated right ventricular systolic pressure is 21.8 mmHg. Left Atrium: Left atrial size was mildly  dilated. Right Atrium: Right atrial size was normal in size. Pericardium: There is no evidence of pericardial effusion. Mitral Valve: The mitral valve is normal in structure. No evidence of mitral valve regurgitation. No evidence of mitral valve stenosis. Tricuspid Valve: The tricuspid valve is normal in structure. Tricuspid valve regurgitation is mild . No evidence of tricuspid stenosis. Aortic Valve: The aortic valve is normal in structure. There is mild calcification of the aortic valve. Aortic valve regurgitation is not visualized. Aortic valve sclerosis is present, with no evidence of aortic valve stenosis. Aortic valve mean gradient  measures 4.0 mmHg. Aortic valve peak gradient measures 7.0 mmHg. Pulmonic Valve: The pulmonic valve was normal in structure. Pulmonic valve regurgitation is not visualized. No evidence of pulmonic stenosis. Aorta: The aortic root is normal in size and structure. Venous: The inferior vena cava is normal in size with greater than 50% respiratory variability, suggesting right atrial pressure of 3 mmHg. IAS/Shunts: No atrial level shunt detected by color flow Doppler. Additional Comments: 3D was performed not requiring image post processing on an independent workstation and was indeterminate.  LEFT VENTRICLE PLAX 2D LVIDd:         4.60 cm Diastology LVIDs:         3.80 cm LV e' medial:    8.92 cm/s LV PW:         0.80 cm LV E/e' medial:  6.1 LV IVS:        0.80 cm LV e' lateral:   8.49 cm/s                        LV E/e' lateral: 6.4  RIGHT VENTRICLE RV S prime:     17.00 cm/s TAPSE (M-mode): 3.3 cm LEFT ATRIUM             Index LA diam:        4.90 cm 2.18 cm/m LA Vol (A2C):   56.8 ml 25.28 ml/m LA Vol (A4C):   34.6 ml 15.40 ml/m LA Biplane Vol: 44.5 ml 19.80 ml/m  AORTIC  VALVE AV Vmax:           132.00 cm/s AV Vmean:          87.100 cm/s AV VTI:            0.211 m AV Peak Grad:      7.0 mmHg AV Mean Grad:      4.0 mmHg LVOT Vmax:         121.00 cm/s LVOT Vmean:        78.100 cm/s LVOT VTI:          0.205 m LVOT/AV VTI ratio: 0.97  AORTA Ao Root diam: 3.50 cm Ao Asc diam:  3.80 cm MITRAL VALVE               TRICUSPID VALVE MV Area (PHT): 3.42 cm    TR Peak grad:   16.8 mmHg MV Decel Time: 222 msec    TR Vmax:        205.00 cm/s MV E velocity: 54.50 cm/s MV A velocity: 93.80 cm/s  SHUNTS MV E/A ratio:  0.58        Systemic VTI: 0.20 m Julien Nordmann MD Electronically signed by Julien Nordmann MD Signature Date/Time: 02/12/2024/7:32:02 PM    Final    CT SOFT TISSUE NECK W CONTRAST Result Date: 02/09/2024 CLINICAL DATA:  Hematologic malignancy. Malt lymphoma. New lymphadenopathy. EXAM: CT NECK WITH CONTRAST TECHNIQUE: Multidetector CT  imaging of the neck was performed using the standard protocol following the bolus administration of intravenous contrast. RADIATION DOSE REDUCTION: This exam was performed according to the departmental dose-optimization program which includes automated exposure control, adjustment of the mA and/or kV according to patient size and/or use of iterative reconstruction technique. CONTRAST:  OMNIPAQUE IOHEXOL 300 MG/ML  SOLN COMPARISON:  PET scan 11/11/2018 FINDINGS: Pharynx and larynx: Lymphoid tissue of Waldeyer's ring is prominent but there is no discrete mucosal or submucosal mass lesion seen otherwise. Salivary glands: Parotid and submandibular glands are intrinsically normal. There are a few small intraparotid lymph nodes noted, left more than right. Thyroid: No thyroid lesion. Lymph nodes: Massive widespread bilateral lymphadenopathy. Index nodes are as follows. Right level 1 submandibular node axial image 77 measures 10 x 16 mm. Massive bilateral level 2 lymphadenopathy. Anterior level 2 node on the left axial image 64 measures 24 x 34 mm. Right  level 2 lymph node on the right axial image 68 measures 25 x 30 mm. Left supraclavicular lymph node axial image 80 for measures 27 x 39 mm. Right supraclavicular lymph node axial image 83 measures 18 x 19 mm. Large cluster of level 2 lymph nodes on each side measure in total 64 x 65 mm on the left and 53 x 55 mm on the right. Vascular: Tortuous carotid arteries. Atherosclerotic calcification of the carotid bifurcations but no likely stenosis. Marked compression of the left jugular vein but without frank occlusion. Limited intracranial: Normal Visualized orbits: Normal Mastoids and visualized paranasal sinuses: Clear except for mild mucosal thickening and a small air-fluid level in the right maxillary sinus. Skeleton: Chronic degenerative spondylosis of the cervical spine. Upper chest: Mild scarring at the lung apices. Pronounced mediastinal lymphadenopathy. Index right paratracheal lymph node axial image 116 measures 25 x 33 mm. Other: None IMPRESSION: 1. Massive widespread bilateral lymphadenopathy in the neck and chest. 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 Index nodes as above. 2. Marked compression of the left jugular vein but without frank occlusion. 3. Mild mucosal thickening and a small air-fluid level in the right maxillary sinus. Aortic Atherosclerosis (ICD10-I70.0). Electronically Signed   By: Paulina Fusi M.D.   On: 02/09/2024 13:30   NM PET Image Restage (PS) Skull Base to Thigh (F-18 FDG) Result Date: 02/09/2024 CLINICAL DATA:  Subsequent treatment strategy for recurrent lymphoma. EXAM: NUCLEAR MEDICINE PET SKULL BASE TO THIGH TECHNIQUE: 12.51 mCi F-18 FDG was injected intravenously. Full-ring PET imaging was performed from the skull base to thigh after the radiotracer. CT data was obtained and used for attenuation correction and anatomic localization. Fasting blood glucose: 109 mg/dl COMPARISON:  Prior PET-CT 11/11/2018 and recent CT scan 01/31/2024 FINDINGS: Mediastinal blood pool  activity: SUV max 3.27 Liver activity: SUV max 4.51 NECK: Extensive multi station hypermetabolic cervical lymphadenopathy. Many of these nodes demonstrate low attenuation suggesting necrosis. Index right level 2 lymph node on image 24/6 measures 27 mm and SUV max is 16.28. Left-sided level 2 lymph node on image 18/6 measures 20 mm and SUV max is 12.8. Incidental CT findings: Bilateral carotid artery calcifications. CHEST: Extensive supraclavicular and left axillary lymphadenopathy. Many of the left axillary lymph nodes are partially necrotic. Index left axillary node on image 54/6 measures 26 mm and SUV max is 15.73. Extensive hypermetabolic mediastinal and hilar lymphadenopathy. 3 cm precarinal lymph node on image 49/6 has an SUV max of 16.95. No worrisome hypermetabolic pulmonary lesions. Incidental CT findings: Stable advanced aortic and three-vessel coronary artery calcifications. ABDOMEN/PELVIS: Extensive  mesenteric and retroperitoneal hypermetabolic lymphadenopathy. Index 3.6 cm largely necrotic node on image 93/6 has an SUV max of 10.0. 21 mm right retroperitoneal lymph node on image 92/6 has an SUV max of 21.37. 11 mm right obturator node on image 136/6 has an SUV max of 14.47. 11 mm left inguinal node on image 151/6 has an SUV max of 11.79. Incidental CT findings: Stable advanced vascular disease but no aortic aneurysm. 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 Stable simple bilateral renal cysts not requiring any further imaging evaluation or follow-up. Stable right renal calculus. SKELETON: No findings suspicious for osseous lymphoma. Incidental CT findings: None. IMPRESSION: 1. Extensive partially necrotic hypermetabolic lymphadenopathy involving the neck, chest, abdomen and pelvis as detailed above. Very similar findings when compared to the prior PET-CT from 2019. [place] 5. 2. No findings suspicious for osseous lymphoma. 3. Status post splenectomy with multiple left upper  quadrant splenules. 4. Stable advanced vascular disease. 5. Aortic atherosclerosis. Electronically Signed   By: Rudie Meyer M.D.   On: 02/09/2024 12:14   CT CHEST ABDOMEN PELVIS W CONTRAST Result Date: 01/31/2024 CLINICAL DATA:  Lymphadenopathy, history of MALT lymphoma with new cervical, submandibular, and left axillary adenopathy * Tracking Code: BO * EXAM: CT CHEST, ABDOMEN, AND PELVIS WITH CONTRAST TECHNIQUE: Multidetector CT imaging of the chest, abdomen and pelvis was performed following the standard protocol during bolus administration of intravenous contrast. RADIATION DOSE REDUCTION: This exam was performed according to the departmental dose-optimization program which includes automated exposure control, adjustment of the mA and/or kV according to patient size and/or use of iterative reconstruction technique. CONTRAST:  OMNIPAQUE IOHEXOL 300 MG/ML SOLN additional oral enteric contrast COMPARISON:  PET-CT, 11/11/2018 FINDINGS: CT CHEST FINDINGS Cardiovascular: Aortic atherosclerosis. Normal heart size. Three-vessel coronary artery calcifications. No pericardial effusion. Mediastinum/Nodes: Numerous new bulky cervical left axillary, subpectoral, and mediastinal lymph nodes, index left supraclavicular node measuring 3.7 x 2.6 cm (series 8, image 10), index left subpectoral node measuring 6.3 x 3.8 cm (series 8, image 30), and index pretracheal node measuring 3.3 x 2.8 cm (series 8, image 26). Thyroid gland, trachea, and esophagus demonstrate no significant findings. Lungs/Pleura: Mild diffuse bilateral bronchial wall thickening. Bibasilar scarring and or atelectasis with elevation of the right hemidiaphragm. Minimal centrilobular and paraseptal emphysema. No pleural effusion or pneumothorax. Musculoskeletal: No chest wall abnormality. No acute osseous findings. CT ABDOMEN PELVIS FINDINGS Hepatobiliary: No solid liver abnormality is seen. Contracted gallbladder. No gallstones, gallbladder wall  thickening, or biliary dilatation. Pancreas: Unremarkable. No pancreatic ductal dilatation or surrounding inflammatory changes. 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 Adrenals/Urinary Tract: Adrenal glands are unremarkable. Kidneys are normal, without renal calculi, solid lesion, or hydronephrosis. Bladder is unremarkable. Stomach/Bowel: Stomach is within normal limits. Appendix appears normal. No evidence of bowel wall thickening, distention, or inflammatory changes. Vascular/Lymphatic: Aortic atherosclerosis. Numerous enlarged upper abdominal lymph nodes, largest gastrohepatic ligament/celiac axis lymph node measuring 2.8 x 2.3 cm (series 8, image 61) largest small bowel mesenteric node measuring 4.0 x 3.9 cm (series 8, image 71). Enlarged bilateral external iliac and pelvic sidewall lymph nodes, measuring up to 2.3 x 1.3 cm on the right (series 8, image 114). Reproductive: No mass or other abnormality. Other: No abdominal wall hernia or abnormality. No ascites. Musculoskeletal: No acute osseous findings. Chronic fracture deformity of the left inferior pubic ramus. Please note that definitively benign incidental findings, functional findings, and anatomic variants may have been intentionally omitted from this report in  the interest of brevity and clarity. If not specifically noted and recommended in the impression, no further follow-up or characterization is required for incidental findings. IMPRESSION: 1. Numerous new bulky lymph nodes, predominantly in the neck and chest although also present in the abdomen and pelvis, consistent with recurrent lymphoma. 2. 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 3. Emphysema. 4. Coronary artery disease. Aortic Atherosclerosis (ICD10-I70.0) and Emphysema (ICD10-J43.9). Electronically Signed   By: Jearld Lesch M.D.   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 FEB 20th, 2025-  Numerous new bulky lymph nodes, predominantly in the neck and chest although also present in the abdomen and pelvis, consistent with recurrent lymphoma. 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  Left Axillary excisional lymph node biopsy on 2/28- Path pending.  # Discussed with pathology recommend port placement and also consideration of R-CHOP chemotherapy.  Will order hepatitis panel/and also order EBV panel.  # #Discussed R-CHOP chemotherapy every 3 weeks x 6 cycles.  Discussed therapies cure. I would do interim scan after 2-3 cycles of chemo.  I discussed at length regarding the individual drugs in R-CHOP. Discussed the potential side effects including but not limited to-increasing fatigue, nausea vomiting, diarrhea, hair loss, sores in the mouth, increase risk of infection and also neuropathy. Also discussed regarding potential side effects of heart failure/leukemia with Adriamycin.  2D echo-March 4th, 2025- EF: 60-65%   # Given patient's current symptoms of throat swelling and pain we will plan to proceed with prednisone 100 mg a day for 5 days.  # Multiple colon polyps/Barrett's surveillance. [facility]- GI]-July 2023- Reviewed the EGD/pathology-no evidence of progression. Stable.     # Elevated LFTs- G-1 likely sec to fatty liver; continue weight loss  # HTN-154/94- ? [at home 130-80s]; question situational.  Monitor closely. Stable.   # Chemotherapy education/nutrition evaluation: port placement. Plan start chemotherapy Sent the scripts for antiemetics-Zofran and Compazine; EMLA cream sent to pharmacy  #  DISPOSITION: #  refer to Chemo education ASAP- re: chemo- possible R-CHOP # refer to Nutrition/speech path re: cancer/concern for malnutrition  # follow up TBD- -Dr.B   # 40 minutes face-to-face with the patient discussing the above plan of care; more than 50% of time spent on prognosis/ natural history; counseling and coordination.   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  However B-cell clonality/and also FISH panel pending.  ABC subtype. Once path is confirmed proceed with [facility]a-RCHP  All questions were answered. The patient knows to call the clinic with any problems, questions or concerns.    Earna Coder, MD 02/13/2024 4:17 PM

## 2024-02-11 NOTE — Progress Notes (Signed)
START ON PATHWAY REGIMEN - Lymphoma and CLL     A cycle is every 21 days:     Prednisone      Rituximab-xxxx      Cyclophosphamide      Doxorubicin      Vincristine   **Always confirm dose/schedule in your pharmacy ordering system**  Patient Characteristics: Diffuse Large B-Cell Lymphoma or Follicular Lymphoma, Grade 3B, First Line, Stage III and IV Disease Type: Not Applicable Disease Type: Diffuse Large B-Cell Lymphoma Disease Type: Not Applicable Line of therapy: First Line Intent of Therapy: Curative Intent, Discussed with Patient 

## 2024-02-11 NOTE — Assessment & Plan Note (Addendum)
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 FEB 20th, 2025-  Numerous new bulky lymph nodes, predominantly in the neck and chest although also present in the abdomen and pelvis, consistent with recurrent lymphoma. 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 Very similar findings when compared to the prior PET-CT from 2019.  Left Axillary excisional lymph node biopsy on 2/28- Path pending..    # Multiple colon polyps/Barrett's surveillance. [facility]- GI]-July 2023- Reviewed the EGD/pathology-no evidence of progression. Stable.    # Macrocytosis- no alcohol; wine- check B12.   # Elevated LFTs- G-1 likely sec to fatty liver; continue weight loss  # Obesity- [goal: 220]- slowly losing weight/weight watchers.  Congratulated patient.  # [id number] ? [at home 130-80s]; question situational.  Monitor closely. Stable.   # Chemotherapy education/nutrition evaluation: port placement. Plan start chemotherapy Sent the scripts for antiemetics-Zofran and Compazine; EMLA cream sent to pharmacy      # DISPOSITION: #  refer to Chemo education ASAP- re: chemo- possible R-CHOP # refer to Nutrition/speech path re: cancer/concern for malnutrition  # follow up TBD- -Dr.B

## 2024-02-12 ENCOUNTER — Other Ambulatory Visit: Payer: Self-pay

## 2024-02-12 ENCOUNTER — Ambulatory Visit: Attending: Internal Medicine

## 2024-02-12 ENCOUNTER — Encounter
Admission: RE | Admit: 2024-02-12 | Discharge: 2024-02-12 | Disposition: A | Discharge status: Home / Self Care | Source: Ambulatory Visit | Attending: General Surgery | Admitting: General Surgery

## 2024-02-12 DIAGNOSIS — Z01810 Encounter for preprocedural cardiovascular examination: Secondary | ICD-10-CM | POA: Insufficient documentation

## 2024-02-12 HISTORY — DX: Chronic diastolic (congestive) heart failure: I50.32

## 2024-02-12 LAB — ECHOCARDIOGRAM COMPLETE
AV Mean grad: 4 mmHg
AV Peak grad: 7 mmHg
Ao pk vel: 1.32 m/s
Area-P 1/2: 3.42 cm2
S' Lateral: 3.8 cm

## 2024-02-12 MED ORDER — PERFLUTREN LIPID MICROSPHERE
1.0000 mL | INTRAVENOUS | Status: AC | PRN
  Administered 2024-02-12: 3 mL via INTRAVENOUS

## 2024-02-12 NOTE — Patient Instructions (Addendum)
 Your procedure is scheduled on: 02/13/24 - Wednesday Report to the Registration Desk on the 1st floor of the [facility]. To find out your arrival time, please call [phone] between 1PM - 3PM on: 02/12/24 - Tuesday If your arrival time is 6:00 am, do not arrive before that time as the [facility] entrance doors do not open until 6:00 am.  REMEMBER: Instructions that are not followed completely may result in serious medical risk, up to and including death; or upon the discretion of your surgeon and anesthesiologist your surgery may need to be rescheduled.  Do not eat food or drink any liquids after midnight the night before surgery.  No gum chewing or hard candies.   One week prior to surgery: Stop Anti-inflammatories (NSAIDS) such as Advil, Aleve, Ibuprofen, Motrin, Naproxen, Naprosyn and Aspirin based products such as Excedrin, Goody's Powder, BC Powder.  Stop ANY OVER THE COUNTER supplements until after surgery.  You may however, continue to take Tylenol if needed for pain up until the day of surgery.  Per Mr. Campanaro, last dose of Plavix taken on 02/10/24.   ON THE DAY OF SURGERY ONLY TAKE THESE MEDICATIONS WITH SIPS OF WATER:  amLODipine (NORVASC)  metoprolol tartrate  pantoprazole (PROTONIX)    No Alcohol for 24 hours before or after surgery.  No Smoking including e-cigarettes for 24 hours before surgery.  No chewable tobacco products for at least 6 hours before surgery.  No nicotine patches on the day of surgery.  Do not use any "recreational" drugs for at least a week (preferably 2 weeks) before your surgery.  Please be advised that the combination of cocaine and anesthesia may have negative outcomes, up to and including death. If you test positive for cocaine, your surgery will be cancelled.  On the morning of surgery brush your teeth with toothpaste and water, you may rinse your mouth with mouthwash if you wish. Do not swallow any toothpaste or  mouthwash.  Use CHG Soap or wipes as directed on instruction sheet.  Do not wear jewelry, make-up, hairpins, clips or nail polish.  For welded (permanent) jewelry: bracelets, anklets, waist bands, etc.  Please have this removed prior to surgery.  If it is not removed, there is a chance that hospital personnel will need to cut it off on the day of surgery.  Do not wear lotions, powders, or perfumes.   Do not shave body hair from the neck down 48 hours before surgery.  Contact lenses, hearing aids and dentures may not be worn into surgery.  Do not bring valuables to the hospital. [facility] is not responsible for any missing/lost belongings or valuables.   Notify your doctor if there is any change in your medical condition (cold, fever, infection).  Wear comfortable clothing (specific to your surgery type) to the hospital.  After surgery, you can help prevent lung complications by doing breathing exercises.  Take deep breaths and cough every 1-2 hours. Your doctor may order a device called an Incentive Spirometer to help you take deep breaths. When coughing or sneezing, hold a pillow firmly against your incision with both hands. This is called "splinting." Doing this helps protect your incision. It also decreases belly discomfort.  If you are being admitted to the hospital overnight, leave your suitcase in the car. After surgery it may be brought to your room.  In case of increased patient census, it may be necessary for you, the patient, to continue your postoperative care in the Same Day  Surgery department.  If you are being discharged the day of surgery, you will not be allowed to drive home. You will need a responsible individual to drive you home and stay with you for 24 hours after surgery.   If you are taking public transportation, you will need to have a responsible individual with you.  Please call the Pre-admissions Testing Dept. at [phone] if you have any questions  about these instructions.  Surgery Visitation Policy:  Patients having surgery or a procedure may have two visitors.  Children under the age of 43 must have an adult with them who is not the patient.  Temporary Visitor Restrictions Due to increasing cases of flu, RSV and COVID-19: Children ages 88 and under will not be able to visit patients in [facility] hospitals under most circumstances.  Inpatient Visitation:    Visiting hours are 7 a.m. to 8 p.m. Up to four visitors are allowed at one time in a patient room. The visitors may rotate out with other people during the day.  One visitor age 35 or older may stay with the patient overnight and must be in the room by 8 p.m.    Preparing for Surgery with CHLORHEXIDINE GLUCONATE (CHG) Soap  Chlorhexidine Gluconate (CHG) Soap  o An antiseptic cleaner that kills germs and bonds with the skin to continue killing germs even after washing  o Used for showering the night before surgery and morning of surgery  Before surgery, you can play an important role by reducing the number of germs on your skin.  CHG (Chlorhexidine gluconate) soap is an antiseptic cleanser which kills germs and bonds with the skin to continue killing germs even after washing.  Please do not use if you have an allergy to CHG or antibacterial soaps. If your skin becomes reddened/irritated stop using the CHG.  1. Shower the NIGHT BEFORE SURGERY and the MORNING OF SURGERY with CHG soap.  2. If you choose to wash your hair, wash your hair first as usual with your normal shampoo.  3. After shampooing, rinse your hair and body thoroughly to remove the shampoo.  4. Use CHG as you would any other liquid soap. You can apply CHG directly to the skin and wash gently with a scrungie or a clean washcloth.  5. Apply the CHG soap to your body only from the neck down. Do not use on open wounds or open sores. Avoid contact with your eyes, ears, mouth, and genitals (private parts).  Wash face and genitals (private parts) with your normal soap.  6. Wash thoroughly, paying special attention to the area where your surgery will be performed.  7. Thoroughly rinse your body with warm water.  8. Do not shower/wash with your normal soap after using and rinsing off the CHG soap.  9. Pat yourself dry with a clean towel.  10. Wear clean pajamas to bed the night before surgery.  12. Place clean sheets on your bed the night of your first shower and do not sleep with pets.  13. Shower again with the CHG soap on the day of surgery prior to arriving at the hospital.  14. Do not apply any deodorants/lotions/powders.  15. Please wear clean clothes to the hospital.

## 2024-02-13 ENCOUNTER — Ambulatory Visit
Admission: RE | Admit: 2024-02-13 | Discharge: 2024-02-13 | Disposition: A | Discharge status: Home / Self Care | Attending: General Surgery | Admitting: General Surgery

## 2024-02-13 ENCOUNTER — Ambulatory Visit

## 2024-02-13 ENCOUNTER — Other Ambulatory Visit: Payer: Self-pay

## 2024-02-13 ENCOUNTER — Encounter
Admission: RE | Disposition: A | Discharge status: Home / Self Care | Payer: Self-pay | Source: Home / Self Care | Attending: General Surgery

## 2024-02-13 ENCOUNTER — Encounter: Payer: Self-pay | Admitting: General Surgery

## 2024-02-13 ENCOUNTER — Other Ambulatory Visit: Payer: Self-pay | Admitting: Internal Medicine

## 2024-02-13 ENCOUNTER — Ambulatory Visit: Payer: Self-pay | Admitting: Anesthesiology

## 2024-02-13 ENCOUNTER — Encounter: Payer: Self-pay | Admitting: Internal Medicine

## 2024-02-13 DIAGNOSIS — Z955 Presence of coronary angioplasty implant and graft: Secondary | ICD-10-CM | POA: Diagnosis not present

## 2024-02-13 DIAGNOSIS — Z7902 Long term (current) use of antithrombotics/antiplatelets: Secondary | ICD-10-CM | POA: Insufficient documentation

## 2024-02-13 DIAGNOSIS — C8590 Non-Hodgkin lymphoma, unspecified, unspecified site: Secondary | ICD-10-CM | POA: Diagnosis not present

## 2024-02-13 DIAGNOSIS — G4730 Sleep apnea, unspecified: Secondary | ICD-10-CM | POA: Insufficient documentation

## 2024-02-13 DIAGNOSIS — Z9081 Acquired absence of spleen: Secondary | ICD-10-CM | POA: Insufficient documentation

## 2024-02-13 DIAGNOSIS — I739 Peripheral vascular disease, unspecified: Secondary | ICD-10-CM | POA: Insufficient documentation

## 2024-02-13 DIAGNOSIS — Z6833 Body mass index (BMI) 33.0-33.9, adult: Secondary | ICD-10-CM | POA: Diagnosis not present

## 2024-02-13 DIAGNOSIS — R0989 Other specified symptoms and signs involving the circulatory and respiratory systems: Secondary | ICD-10-CM | POA: Diagnosis not present

## 2024-02-13 DIAGNOSIS — C884 Extranodal marginal zone b-cell lymphoma of mucosa-associated lymphoid tissue (malt-lymphoma) not having achieved remission: Secondary | ICD-10-CM | POA: Diagnosis not present

## 2024-02-13 DIAGNOSIS — Z452 Encounter for adjustment and management of vascular access device: Secondary | ICD-10-CM | POA: Diagnosis not present

## 2024-02-13 DIAGNOSIS — I2510 Atherosclerotic heart disease of native coronary artery without angina pectoris: Secondary | ICD-10-CM | POA: Insufficient documentation

## 2024-02-13 DIAGNOSIS — I5032 Chronic diastolic (congestive) heart failure: Secondary | ICD-10-CM | POA: Diagnosis not present

## 2024-02-13 DIAGNOSIS — E669 Obesity, unspecified: Secondary | ICD-10-CM | POA: Insufficient documentation

## 2024-02-13 DIAGNOSIS — Z419 Encounter for procedure for purposes other than remedying health state, unspecified: Secondary | ICD-10-CM

## 2024-02-13 DIAGNOSIS — J9811 Atelectasis: Secondary | ICD-10-CM | POA: Diagnosis not present

## 2024-02-13 DIAGNOSIS — I110 Hypertensive heart disease with heart failure: Secondary | ICD-10-CM | POA: Insufficient documentation

## 2024-02-13 HISTORY — PX: PORTACATH PLACEMENT: SHX2246

## 2024-02-13 LAB — SURGICAL PATHOLOGY

## 2024-02-13 SURGERY — INSERTION, TUNNELED CENTRAL VENOUS DEVICE, WITH PORT
Anesthesia: General | Site: Chest

## 2024-02-13 MED ORDER — ORAL CARE MOUTH RINSE: 15.0000 mL | Freq: Once | OROMUCOSAL | Status: AC

## 2024-02-13 MED ORDER — HEPARIN SODIUM (PORCINE) 5000 UNIT/ML IJ SOLN
INTRAMUSCULAR | Status: AC
  Filled 2024-02-13: qty 1

## 2024-02-13 MED ORDER — CEFAZOLIN SODIUM-DEXTROSE 2-4 GM/100ML-% IV SOLN
INTRAVENOUS | Status: AC
  Filled 2024-02-13: qty 100

## 2024-02-13 MED ORDER — OXYCODONE HCL 5 MG/5ML PO SOLN: 5.0000 mg | Freq: Once | ORAL | Status: DC | PRN

## 2024-02-13 MED ORDER — PROPOFOL 500 MG/50ML IV EMUL
INTRAVENOUS | Status: DC | PRN
  Administered 2024-02-13: 30 ug/kg/min via INTRAVENOUS

## 2024-02-13 MED ORDER — PHENYLEPHRINE 80 MCG/ML (10ML) SYRINGE FOR IV PUSH (FOR BLOOD PRESSURE SUPPORT)
PREFILLED_SYRINGE | INTRAVENOUS | Status: AC
  Filled 2024-02-13: qty 10

## 2024-02-13 MED ORDER — DEXAMETHASONE SODIUM PHOSPHATE 10 MG/ML IJ SOLN
INTRAMUSCULAR | Status: AC
  Filled 2024-02-13: qty 1

## 2024-02-13 MED ORDER — DEXAMETHASONE SODIUM PHOSPHATE 10 MG/ML IJ SOLN
INTRAMUSCULAR | Status: DC | PRN
  Administered 2024-02-13: 10 mg via INTRAVENOUS

## 2024-02-13 MED ORDER — CHLORHEXIDINE GLUCONATE 0.12 % MT SOLN
15.0000 mL | Freq: Once | OROMUCOSAL | Status: AC
  Administered 2024-02-13: 15 mL via OROMUCOSAL

## 2024-02-13 MED ORDER — LACTATED RINGERS IV SOLN: INTRAVENOUS | Status: DC

## 2024-02-13 MED ORDER — FENTANYL CITRATE (PF) 100 MCG/2ML IJ SOLN: 25.0000 ug | INTRAMUSCULAR | Status: DC | PRN

## 2024-02-13 MED ORDER — FENTANYL CITRATE (PF) 100 MCG/2ML IJ SOLN
INTRAMUSCULAR | Status: DC | PRN
  Administered 2024-02-13: 50 ug via INTRAVENOUS

## 2024-02-13 MED ORDER — ACETAMINOPHEN 10 MG/ML IV SOLN: 1000.0000 mg | Freq: Once | INTRAVENOUS | Status: DC | PRN

## 2024-02-13 MED ORDER — LIDOCAINE HCL (PF) 2 % IJ SOLN
INTRAMUSCULAR | Status: AC
  Filled 2024-02-13: qty 5

## 2024-02-13 MED ORDER — CEFAZOLIN SODIUM-DEXTROSE 2-4 GM/100ML-% IV SOLN
2.0000 g | INTRAVENOUS | Status: AC
  Administered 2024-02-13: 2 g via INTRAVENOUS

## 2024-02-13 MED ORDER — EPHEDRINE SULFATE-NACL 50-0.9 MG/10ML-% IV SOSY
PREFILLED_SYRINGE | INTRAVENOUS | Status: DC | PRN
Start: 2024-02-13 — End: 2024-02-13
  Administered 2024-02-13: 5 mg via INTRAVENOUS

## 2024-02-13 MED ORDER — CHLORHEXIDINE GLUCONATE 0.12 % MT SOLN
OROMUCOSAL | Status: AC
  Filled 2024-02-13: qty 15

## 2024-02-13 MED ORDER — SODIUM CHLORIDE 0.9 % IV SOLN
INTRAVENOUS | Status: DC | PRN
  Administered 2024-02-13: 10 mL via INTRAMUSCULAR

## 2024-02-13 MED ORDER — FENTANYL CITRATE (PF) 100 MCG/2ML IJ SOLN
INTRAMUSCULAR | Status: AC
  Filled 2024-02-13: qty 2

## 2024-02-13 MED ORDER — SODIUM CHLORIDE (PF) 0.9 % IJ SOLN
INTRAMUSCULAR | Status: DC | PRN
  Administered 2024-02-13: 50 mL

## 2024-02-13 MED ORDER — BUPIVACAINE-EPINEPHRINE (PF) 0.25% -1:200000 IJ SOLN
INTRAMUSCULAR | Status: DC | PRN
  Administered 2024-02-13: 20 mL

## 2024-02-13 MED ORDER — OXYCODONE HCL 5 MG PO TABS: 5.0000 mg | ORAL_TABLET | Freq: Once | ORAL | Status: DC | PRN

## 2024-02-13 MED ORDER — LIDOCAINE HCL (CARDIAC) PF 100 MG/5ML IV SOSY
PREFILLED_SYRINGE | INTRAVENOUS | Status: DC | PRN
Start: 2024-02-13 — End: 2024-02-13
  Administered 2024-02-13: 40 mg via INTRAVENOUS

## 2024-02-13 MED ORDER — PHENYLEPHRINE 80 MCG/ML (10ML) SYRINGE FOR IV PUSH (FOR BLOOD PRESSURE SUPPORT)
PREFILLED_SYRINGE | INTRAVENOUS | Status: DC | PRN
  Administered 2024-02-13: 160 ug via INTRAVENOUS
  Administered 2024-02-13 (×4): 80 ug via INTRAVENOUS
  Administered 2024-02-13: 160 ug via INTRAVENOUS
  Administered 2024-02-13 (×2): 80 ug via INTRAVENOUS

## 2024-02-13 MED ORDER — ESMOLOL HCL 100 MG/10ML IV SOLN
INTRAVENOUS | Status: DC | PRN
Start: 2024-02-13 — End: 2024-02-13
  Administered 2024-02-13: 10 mg via INTRAVENOUS

## 2024-02-13 MED ORDER — ONETOUCH ULTRASOFT LANCETS MISC: 1.0000 | Freq: Two times a day (BID) | 12 refills | Status: DC

## 2024-02-13 MED ORDER — PROPOFOL 10 MG/ML IV BOLUS
INTRAVENOUS | Status: AC
  Filled 2024-02-13: qty 40

## 2024-02-13 MED ORDER — ONDANSETRON HCL 4 MG/2ML IJ SOLN: 4.0000 mg | Freq: Once | INTRAMUSCULAR | Status: DC | PRN

## 2024-02-13 MED ORDER — EPHEDRINE 5 MG/ML INJ
INTRAVENOUS | Status: AC
  Filled 2024-02-13: qty 5

## 2024-02-13 MED ORDER — BUPIVACAINE-EPINEPHRINE (PF) 0.25% -1:200000 IJ SOLN
INTRAMUSCULAR | Status: AC
  Filled 2024-02-13: qty 30

## 2024-02-13 MED ORDER — ESMOLOL HCL 100 MG/10ML IV SOLN
INTRAVENOUS | Status: AC
  Filled 2024-02-13: qty 10

## 2024-02-13 SURGICAL SUPPLY — 28 items
BAG DECANTER FOR FLEXI CONT (MISCELLANEOUS) ×1 IMPLANT
BLADE SURG 15 STRL LF DISP TIS (BLADE) ×1 IMPLANT
BLADE SURG SZ11 CARB STEEL (BLADE) ×1 IMPLANT
CLAMP SUTURE YELLOW 5 PAIRS (MISCELLANEOUS) ×1 IMPLANT
DERMABOND ADVANCED .7 DNX12 (GAUZE/BANDAGES/DRESSINGS) ×1 IMPLANT
DRAPE C-ARM XRAY 36X54 (DRAPES) ×1 IMPLANT
ELECT REM PT RETURN 9FT ADLT (ELECTROSURGICAL) ×1 IMPLANT
ELECTRODE REM PT RTRN 9FT ADLT (ELECTROSURGICAL) ×1 IMPLANT
GLOVE BIO SURGEON STRL SZ 6.5 (GLOVE) ×1 IMPLANT
GLOVE BIOGEL PI IND STRL 6.5 (GLOVE) ×1 IMPLANT
GOWN STRL REUS W/ TWL LRG LVL3 (GOWN DISPOSABLE) ×2 IMPLANT
IV NS 500ML BAXH (IV SOLUTION) ×1 IMPLANT
KIT PORT INFUSION SMART 8FR (Port) ×1 IMPLANT
KIT TURNOVER KIT A (KITS) ×1 IMPLANT
LABEL OR SOLS (LABEL) ×1 IMPLANT
MANIFOLD NEPTUNE II (INSTRUMENTS) ×1 IMPLANT
NDL FILTER BLUNT 18X1 1/2 (NEEDLE) ×1 IMPLANT
NEEDLE FILTER BLUNT 18X1 1/2 (NEEDLE) ×1 IMPLANT
PACK PORT-A-CATH (MISCELLANEOUS) ×1 IMPLANT
SUT MNCRL AB 4-0 PS2 18 (SUTURE) ×1 IMPLANT
SUT PROLENE 2 0 FS (SUTURE) ×1 IMPLANT
SUT VIC AB 2-0 SH 27XBRD (SUTURE) ×1 IMPLANT
SUT VIC AB 3-0 SH 27X BRD (SUTURE) ×1 IMPLANT
SYR 10ML LL (SYRINGE) ×2 IMPLANT
SYR 3ML LL SCALE MARK (SYRINGE) ×1 IMPLANT
TAG SUTURE CLAMP YLW 5PR (MISCELLANEOUS) ×1 IMPLANT
TRAP FLUID SMOKE EVACUATOR (MISCELLANEOUS) ×1 IMPLANT
WATER STERILE IRR 500ML POUR (IV SOLUTION) ×1 IMPLANT

## 2024-02-13 NOTE — Progress Notes (Signed)
 Pharmacist Chemotherapy Monitoring - Initial Assessment    Anticipated start date: 02/20/24   The following has been reviewed per standard work regarding the patient's treatment regimen: The patient's diagnosis, treatment plan and drug doses, and organ/hematologic function Lab orders and baseline tests specific to treatment regimen  The treatment plan start date, drug sequencing, and pre-medications Prior authorization status  Patient's documented medication list, including drug-drug interaction screen and prescriptions for anti-emetics and supportive care specific to the treatment regimen The drug concentrations, fluid compatibility, administration routes, and timing of the medications to be used The patient's access for treatment and lifetime cumulative dose history, if applicable  The patient's medication allergies and previous infusion related reactions, if applicable   Changes made to treatment plan:  N/A  Follow up needed:  Port placement 02/13/24  Rapid Infusion Rituximab Pharmacist Evaluation  Jesse Norton is a 78 y.o. male being treated with rituximab for MALT Lymphoma. This patient may be considered for RIR.   A pharmacist has verified the patient tolerated rituximab infusions per the [facility] standard infusion protocol without grade 3-4 infusion reactions. The treatment plan will be updated to reflect RIR if the patient qualifies per the checklist below:   Age > 47 years old Yes   Clinically significant cardiovascular disease Yes   Circulating lymphocyte count < 5000/uL prior to cycle two pending Lab Results  Component Value Date   LYMPHSABS 1.4 02/11/2024    Prior documented grade 3-4 infusion reaction to rituximab No   Prior documented grade 1-2 infusion reaction to rituximab (If YES, Pharmacist will confirm with Physician if patient is still a candidate for RIR) No   Previous rituximab infusion within the past 6 months No   Treatment Plan updated orders to reflect RIR  No    [name] does not meet the criteria for Rapid Infusion Rituximab. This patient is not going to be switched to rapid infusion rituximab due to cardiovascular disease (CHF & pacemaker).  Ebony Hail, Pharm.D., CPP 02/13/2024@12 :31 PM

## 2024-02-13 NOTE — Discharge Instructions (Addendum)
   Diet: Resume home heart healthy regular diet.   Activity: Increase activity as tolerated. Light activity and walking are encouraged. Do not drive or drink alcohol if taking narcotic pain medications.  Wound care: May shower with soapy water and pat dry (do not rub incisions), but no baths or submerging incision underwater until follow-up. (no swimming)   Medications: Resume all home medications. For mild to moderate pain: acetaminophen (Tylenol) or ibuprofen (if no kidney disease). Combining Tylenol with alcohol can substantially increase your risk of causing liver disease. Narcotic pain medications, if prescribed, can be used for severe pain, though may cause nausea, constipation, and drowsiness. Do not combine Tylenol and Norco within a 6 hour period as Norco contains Tylenol. If you do not need the narcotic pain medication, you do not need to fill the prescription.  Call office [phone]) at any time if any questions, worsening pain, fevers/chills, bleeding, drainage from incision site, or other concerns.

## 2024-02-13 NOTE — Transfer of Care (Signed)
 Immediate Anesthesia Transfer of Care Note  Patient: Jesse Norton  Procedure(s) Performed: INSERTION, TUNNELED CENTRAL VENOUS DEVICE, WITH PORT (Chest)  Patient Location: PACU  Anesthesia Type:General  Level of Consciousness: awake, alert , and oriented  Airway & Oxygen Therapy: Patient Spontanous Breathing and Patient connected to face mask oxygen  Post-op Assessment: Report given to RN and Post -op Vital signs reviewed and stable  Post vital signs: Reviewed and stable  Last Vitals:  Vitals Value Taken Time  BP 103/73 02/13/24 1051  Temp 36.2 C 02/13/24 1051  Pulse 80 02/13/24 1054  Resp 23 02/13/24 1054  SpO2 100 % 02/13/24 1054  Vitals shown include unfiled device data.  Last Pain:  Vitals:   02/13/24 0831  TempSrc: Temporal  PainSc: 3          Complications: No notable events documented.

## 2024-02-13 NOTE — H&P (Signed)
 History of Present Illness Jesse Norton is a 78 y.o. 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  Patient with new numerous bulky lymph nodes in neck and chest, abdomen and pelvis consistent with recurrent lymphoma.  Patient evaluated by medical oncology and recommended to proceed with chemotherapy for treatment.  They recommended to put Chemo-Port.  Patient present today for insertion of Port-A-Cath.  Patient denies any chest pain or shortness of breath.  Past Medical History Past Medical History:  Diagnosis Date   (HFpEF) heart failure with preserved ejection fraction (HCC) 01/21/2019   a.) LHC 01/21/2019: EF 55-60%, LVEDP 25-30 mmHg   Anxiety    Aortic atherosclerosis (HCC)    Ascending aorta dilatation (HCC) 07/16/2023   a.) TTE 07/16/2023: 38 mm   Barrett's esophagus    BPH (benign prostatic hyperplasia)    CAD (coronary artery disease) 10/2018   a.) ETT 11/06/2018: 1mm horizontal inflat ST depression, freq PVC's -> intermediate risk; b.) cCTA 12/23/2018: Ca2+ 2408 (93rd %'ile); FFR 0.6 mLAD, 0.87 mLCx, 0.77 dLCx --> cath recommended; c.) LHC/PCI 01/01/2019:  40% pLAD, 85% mLAD, 70% D2, 70% pLCx (2.75 x 15 mm Resolute Onyx DES), RCA min irregs -- staged PCI; d.) staged PCI 01/21/2019: orbital athrectomy + 3.0 x 15 mm Resolute Onyx DES mLAD   Chronic heart failure with preserved ejection fraction (HCC)    Colon polyps    Duodenal adenoma    Gastritis    GERD (gastroesophageal reflux disease)    Gout    H. pylori infection    H/O urethral stricture    Hemorrhoids    Hiatal hernia    History of bilateral cataract extraction 2023   History of splenectomy 1957   Hypertension    Mucosa-associated lymphoid tissue (MALT) lymphoma    Pre-diabetes    Pulmonary emphysema (HCC)    Stomach ulcer        Past Surgical History:  Procedure Laterality Date   CATARACT EXTRACTION W/PHACO Left 03/07/2022   Procedure: CATARACT EXTRACTION PHACO AND INTRAOCULAR LENS PLACEMENT (IOC) LEFT VIVITY TORIC LENS  10.41 00:55.7;  Surgeon: Galen Manila, MD;  Location: [facility];  Service: Ophthalmology;  Laterality: Left;   CATARACT EXTRACTION W/PHACO Right 03/28/2022   Procedure: CATARACT EXTRACTION PHACO AND INTRAOCULAR LENS PLACEMENT (IOC) RIGHT VIVITY LENS 6.68 00:50.0;  Surgeon: Galen Manila, MD;  Location: [facility];  Service: Ophthalmology;  Laterality: Right;   COLONOSCOPY     COLONOSCOPY N/A 06/19/2022   Procedure: COLONOSCOPY;  Surgeon: Jaynie Collins, DO;  Location: [facility] ENDOSCOPY;  Service: Gastroenterology;  Laterality: N/A;   COLONOSCOPY WITH PROPOFOL N/A 08/20/2018   Procedure: COLONOSCOPY WITH PROPOFOL;  Surgeon: Christena Deem, MD;  Location: [facility] ENDOSCOPY;  Service: Endoscopy;  Laterality: N/A;   COLONOSCOPY WITH PROPOFOL N/A 12/19/2018   Procedure: COLONOSCOPY WITH PROPOFOL;  Surgeon: Christena Deem, MD;  Location: [facility] ENDOSCOPY;  Service: Endoscopy;  Laterality: N/A;   CORONARY ATHERECTOMY N/A 02/03/2019   Procedure: CORONARY ATHERECTOMY;  Surgeon: Yvonne Kendall, MD;  Location: [facility] INVASIVE CV [facility];  Service: Cardiovascular;  Laterality: N/A;   CORONARY STENT INTERVENTION N/A 01/21/2019   Procedure: CORONARY STENT INTERVENTION;  Surgeon: Yvonne Kendall, MD;  Location: [facility] INVASIVE CV LAB;  Service: Cardiovascular;  Laterality: N/A;   CORONARY STENT INTERVENTION N/A 02/03/2019   Procedure: CORONARY STENT INTERVENTION;  Surgeon: Yvonne Kendall, MD;  Location: [facility];  Service: Cardiovascular;  Laterality: N/A;   CORONARY ULTRASOUND/IVUS N/A 02/03/2019   Procedure: Intravascular Ultrasound/IVUS;  Surgeon: Yvonne Kendall,  MD;  Location: [facility];  Service: Cardiovascular;  Laterality: N/A;   CYSTOSCOPY WITH URETHRAL DILATATION N/A 08/16/2018   Procedure: CYSTOSCOPY WITH URETHRAL DILATATION;  Surgeon: Sondra Come, MD;  Location: [facility];  Service: Urology;  Laterality: N/A;   ESOPHAGOGASTRODUODENOSCOPY N/A  01/29/2017   Procedure: ESOPHAGOGASTRODUODENOSCOPY (EGD);  Surgeon: Christena Deem, MD;  Location: [facility] ENDOSCOPY;  Service: Endoscopy;  Laterality: N/A;   ESOPHAGOGASTRODUODENOSCOPY N/A 06/19/2022   Procedure: ESOPHAGOGASTRODUODENOSCOPY (EGD);  Surgeon: Jaynie Collins, DO;  Location: [facility] ENDOSCOPY;  Service: Gastroenterology;  Laterality: N/A;   ESOPHAGOGASTRODUODENOSCOPY (EGD) WITH PROPOFOL N/A 07/10/2016   Procedure: ESOPHAGOGASTRODUODENOSCOPY (EGD) WITH PROPOFOL;  Surgeon: Christena Deem, MD;  Location: [facility] ENDOSCOPY;  Service: Endoscopy;  Laterality: N/A;   ESOPHAGOGASTRODUODENOSCOPY (EGD) WITH PROPOFOL N/A 12/19/2018   Procedure: ESOPHAGOGASTRODUODENOSCOPY (EGD) WITH PROPOFOL;  Surgeon: Christena Deem, MD;  Location: [facility] ENDOSCOPY;  Service: Endoscopy;  Laterality: N/A;   left elbow repair Left    LEFT HEART CATH AND CORONARY ANGIOGRAPHY Left 01/21/2019   Procedure: LEFT HEART CATH AND CORONARY ANGIOGRAPHY;  Surgeon: Yvonne Kendall, MD;  Location: [facility] INVASIVE CV LAB;  Service: Cardiovascular;  Laterality: Left;   LYMPH NODE DISSECTION Left 02/08/2024   Procedure: LYMPH NODE DISSECTION;  Surgeon: Carolan Shiver, MD;  Location: [facility] ORS;  Service: General;  Laterality: Left;   SPLENECTOMY  1957   TONSILLECTOMY     as a child    No Known Allergies  Current Facility-Administered Medications  Medication Dose Route Frequency Provider Last Rate Last Admin   ceFAZolin (ANCEF) IVPB 2g/100 mL premix  2 g Intravenous On Call to OR Carolan Shiver, MD       lactated ringers infusion   Intravenous Continuous Foye Deer, MD        Family History Family History  Adopted: Yes  Problem Relation Age of Onset   Heart attack Mother 35   Pancreatic cancer Father        died in WWII - pt adopted   Hypertension Sister    Hyperlipidemia Sister    Hypertension Brother    Hyperlipidemia Brother    Prostate cancer Neg Hx    Kidney cancer Neg Hx     Bladder Cancer Neg Hx        Social History Social History   Tobacco Use   Smoking status: Never   Smokeless tobacco: Never  Vaping Use   Vaping status: Never Used  Substance Use Topics   Alcohol use: Not Currently   Drug use: No       ROS Full ROS of systems performed and is otherwise negative there than what is stated in the HPI  Physical Exam Blood pressure 121/69, pulse 81, temperature (!) 97.4 F (36.3 C), temperature source Temporal, resp. rate 17, height 5' 10.5" (1.791 m), weight 106.6 kg, SpO2 100%.  CONSTITUTIONAL: Alert, oriented x 3 EYES: Pupils equal, round, and reactive to light, Sclera non-icteric. EARS, NOSE, MOUTH AND THROAT: The oropharynx is clear. Oral mucosa is pink and moist. Hearing is intact to voice.  NECK: Trachea is midline, and there is no jugular venous distension.  Palpable lymphadenopathy LYMPH NODES:  Lymph nodes in the neck are not enlarged. RESPIRATORY:  Lungs are clear, and breath sounds are equal bilaterally. Normal respiratory effort without pathologic use of accessory muscles. CARDIOVASCULAR: Heart is regular without murmurs, gallops, or rubs. GI: The abdomen is soft, nontender, and nondistended. There were no palpable masses. There was no hepatosplenomegaly. There were normal  bowel sounds. MUSCULOSKELETAL:  Normal muscle strength and tone in all four extremities.    SKIN: Skin turgor is normal. There are no pathologic skin lesions.  NEUROLOGIC:  Motor and sensation is grossly normal.  Cranial nerves are grossly intact. PSYCH:  Alert and oriented to person, place and time. Affect is normal.   I have personally reviewed the patient's imaging and medical records.    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  Patient oriented about the procedure, the benefit and its  risk.  Patient agreed to proceed with insertion of Port-A-Cath.  Carolan Shiver, MD  Carolan Shiver 02/13/2024, 9:29 AM

## 2024-02-13 NOTE — Anesthesia Postprocedure Evaluation (Signed)
 Anesthesia Post Note  Patient: Jesse Norton  Procedure(s) Performed: INSERTION, TUNNELED CENTRAL VENOUS DEVICE, WITH PORT (Chest)  Patient location during evaluation: PACU Anesthesia Type: General Level of consciousness: awake and alert, oriented and patient cooperative Pain management: pain level controlled Vital Signs Assessment: post-procedure vital signs reviewed and stable Respiratory status: spontaneous breathing, nonlabored ventilation and respiratory function stable Cardiovascular status: blood pressure returned to baseline and stable Postop Assessment: adequate PO intake Anesthetic complications: no   No notable events documented.   Last Vitals:  Vitals:   02/13/24 1115 02/13/24 1130  BP: 104/63   Pulse: 82 77  Resp: (!) 25 (!) 27  Temp:  (!) 36.1 C  SpO2: 98% 98%    Last Pain:  Vitals:   02/13/24 1130  TempSrc:   PainSc: 0-No pain                 Reed Breech

## 2024-02-13 NOTE — Progress Notes (Signed)
 Discussed with hematopathology-recommend further evaluation with EBV serologies.  Tumor appears ABC subtype.  Awaiting FISH panel; B-cell gene rearrangement.  recommend further evaluation with EBV serologies; hepatitis serologies-ordered.  Please have the labs drawn- tomorrow when pt comes for chemo ed-3/06-  GB

## 2024-02-13 NOTE — Op Note (Signed)
 SURGICAL PROCEDURE REPORT  DATE OF PROCEDURE: 02/13/2024   SURGEON: Dr. Hazle Quant   ANESTHESIA: Local with light IV sedation   PRE-OPERATIVE DIAGNOSIS: Advanced B cell lymphoma cancer requiring durable central venous access for chemotherapy   POST-OPERATIVE DIAGNOSIS: Same  PROCEDURE(S): (cpt: 36561) 1.) Percutaneous access of Right internal jugular vein under ultrasound guidance 2.) Insertion of tunneled Right internal jugular central venous catheter with subcutaneous port  INTRAOPERATIVE FINDINGS: Patent easily compressible Right internal jugular vein with appropriate respiratory variations and well-secured tunneled central venous catheter with subcutaneous port at completion of the procedure  ESTIMATED BLOOD LOSS: Minimal (<20 mL)   SPECIMENS: None   IMPLANTS: 96F tunneled Bard PowerPort central venous catheter with subcutaneous port  DRAINS: None   COMPLICATIONS: None apparent   CONDITION AT COMPLETION: Hemodynamically stable, awake   DISPOSITION: PACU   INDICATION(S) FOR PROCEDURE:  Patient is a 78 y.o. male who presented with advanced B Cell Lymphoma cancer requiring durable central venous access for chemotherapy. All risks, benefits, and alternatives to above elective procedures were discussed with the patient, who elected to proceed, and informed consent was accordingly obtained at that time.  DETAILS OF PROCEDURE:  Patient was brought to the operative suite and appropriately identified. In Trendelenburg position, Right IJ venous access site was prepped and draped in the usual sterile fashion, and following a brief timeout, percutaneous Right IJ venous access was obtained under ultrasound guidance using Seldinger technique, by which local anesthetic was injected over the Right IJ vein, and access needle was inserted under direct ultrasound visualization into the Right IJ vein, through which soft guidewire was advanced, over which access needle was withdrawn. Guidewire  was secured, attention was directed to injection of local anesthetic along the planned tunnel site, 2-3 cm transverse Right chest incision was made and confirmed to accommodate the subcutaneous port, and flushed catheter was tunneled retrograde from the port site over the Right chest to the Right IJ access site with the attached port well-secured to the catheter and within the subcutaneous pocket. Insertion sheath was advanced over the guidewire, which was withdrawn along with the insertion sheath dilator. The catheter was introduced through the sheath and left on the [place] junction under fluoro guidance and catheter cut to desire lenght. Catheter connected to port and fixed to the pocket on two side to avoid twisting. Port was confirmed to withdraw blood and flush easily, after which concentrated heparin was instilled into the port and catheter. Dermis at the subcutaneous pocket was re-approximated using buried interrupted 3-0 Vicryl suture, and 4-0 Monocryl suture was used to re-approximate skin at the insertion and subcutaneous port sites in running subcuticular fashion for the subcutaneous port and buried interrupted fashion for the insertion site. Skin was cleaned, dried, and sterile skin glue was applied. Patient was then safely transferred to PACU for a chest x-ray. Ultrasound images are available on paper chart and Fluoroscopy guidance images are available in Epic.

## 2024-02-13 NOTE — Anesthesia Preprocedure Evaluation (Addendum)
 Anesthesia Evaluation  Patient identified by MRN, date of birth, ID band Patient awake    Reviewed: Allergy & Precautions, NPO status , Patient's Chart, lab work & pertinent test results  History of Anesthesia Complications Negative for: history of anesthetic complications  Airway Mallampati: II   Neck ROM: Full    Dental no notable dental hx.    Pulmonary sleep apnea    Pulmonary exam normal breath sounds clear to auscultation       Cardiovascular hypertension, + CAD (s/p stents on Plavix), + Peripheral Vascular Disease and +CHF (preserved EF)  Normal cardiovascular exam Rhythm:Regular Rate:Normal  ECG 02/11/24: normal  Echo 07/16/23: Normal LV size and wall thickness.  LVEF 60-65% with normal wall motion and diastolic parameters.  Normal RV size and function.  Normal biatrial size.  No pericardial effusion.  Mild mitral regurgitation.  Otherwise, no significant valvular abnormalities.  Borderline dilation of the ascending aorta, measuring 3.8 cm.   Neuro/Psych  PSYCHIATRIC DISORDERS Anxiety Depression    negative neurological ROS     GI/Hepatic hiatal hernia, PUD,GERD (Barrett esophagus)  ,,  Endo/Other  Prediabetes; obesity  Renal/GU negative Renal ROS     Musculoskeletal Gout    Abdominal   Peds  Hematology S/p splenectomy; MALT lymphoma   Anesthesia Other Findings Reviewed and agree with Jesse Norton pre-anesthesia clinical review note.    Cardiology note 02/07/24:  Coronary artery disease without angina and hyperlipidemia: Jesse Norton has not had any angina though I am concerned that his clopidogrel has been held for the last 6 days without any antiplatelet therapy still being taken in anticipation of his lymph node dissection tomorrow.  His EKG shows nonspecific ST/T abnormalities that are new compared to the tracing just a few days ago, though they have been present on more remote EKGs.  As cessation of clopidogrel  was made without our recommendation, I have administered aspirin 324 mg x 1 in the office today and advised Jesse Norton to continue taking aspirin 81 mg daily until his surgical team feels it is safe for him to resume clopidogrel.  I have advised him to seek immediate medical attention where he to develop any chest pain or shortness of breath.  Continue atorvastatin 40 mg daily for secondary prevention.   Hypertension: Blood pressure low normal today but typically a bit higher.  We will plan to continue current regimen of amlodipine, losartan, and metoprolol.   Chronic HFpEF: Jesse Norton appears euvolemic with stable NYHA class I-2 symptoms.  Defer medication changes at this time.   Lymphoma: Jesse Norton has a history of MALT lymphoma but has noticed left axillary and cervical lymphadenopathy for which he is scheduled to undergo a lymph node dissection tomorrow.  He also endorses some recent diaphoresis that is concerning for a B symptom.  I think it is reasonable for him to proceed with this low risk procedure from a cardiovascular standpoint, though given his history of multivessel PCI in 2020, I have asked him to take aspirin while clopidogrel is on hold.  I advised him to discuss his recent URI symptoms with his surgical/anesthesia team to ensure that he is not at increased perioperative risk due to this.    Dispo: Return to clinic in 4 to 6 weeks.   Reproductive/Obstetrics                             Anesthesia Physical Anesthesia Plan  ASA: 3  Anesthesia Plan:  General   Post-op Pain Management:    Induction: Intravenous  PONV Risk Score and Plan: 2 and Ondansetron, Dexamethasone, Treatment may vary due to age or medical condition, Propofol infusion and TIVA  Airway Management Planned: Natural Airway  Additional Equipment:   Intra-op Plan:   Post-operative Plan:   Informed Consent: I have reviewed the patients History and Physical, chart, labs and  discussed the procedure including the risks, benefits and alternatives for the proposed anesthesia with the patient or authorized representative who has indicated his/her understanding and acceptance.       Plan Discussed with: CRNA  Anesthesia Plan Comments: (LMA/GETA backup discussed.  Patient consented for risks of anesthesia including but not limited to:  - adverse reactions to medications - damage to eyes, teeth, lips or other oral mucosa - nerve damage due to positioning  - sore throat or hoarseness - damage to heart, brain, nerves, lungs, other parts of body or loss of life  Informed patient about role of CRNA in peri- and intra-operative care.  Patient voiced understanding.)        Anesthesia Quick Evaluation

## 2024-02-14 ENCOUNTER — Inpatient Hospital Stay

## 2024-02-14 ENCOUNTER — Encounter: Payer: Self-pay | Admitting: General Surgery

## 2024-02-14 ENCOUNTER — Encounter: Payer: Self-pay | Admitting: Internal Medicine

## 2024-02-14 DIAGNOSIS — C884 Extranodal marginal zone b-cell lymphoma of mucosa-associated lymphoid tissue (malt-lymphoma) not having achieved remission: Secondary | ICD-10-CM

## 2024-02-14 DIAGNOSIS — I10 Essential (primary) hypertension: Secondary | ICD-10-CM | POA: Diagnosis not present

## 2024-02-14 DIAGNOSIS — K2270 Barrett's esophagus without dysplasia: Secondary | ICD-10-CM | POA: Diagnosis not present

## 2024-02-14 DIAGNOSIS — C8338 Diffuse large B-cell lymphoma, lymph nodes of multiple sites: Secondary | ICD-10-CM | POA: Diagnosis not present

## 2024-02-14 DIAGNOSIS — I959 Hypotension, unspecified: Secondary | ICD-10-CM | POA: Diagnosis not present

## 2024-02-14 DIAGNOSIS — Z9081 Acquired absence of spleen: Secondary | ICD-10-CM | POA: Diagnosis not present

## 2024-02-14 LAB — HEPATITIS B SURFACE ANTIGEN: Hepatitis B Surface Ag: NONREACTIVE

## 2024-02-14 LAB — HEPATITIS C ANTIBODY: HCV Ab: NONREACTIVE

## 2024-02-14 NOTE — Progress Notes (Signed)
 Nutrition Assessment   Reason for Assessment:   Starting chemo    ASSESSMENT:  78 year old male with recurrent MALT lymphoma, new lymph nodes in neck, chest, abdomen and pelvis.  S/p splenectomy.  Past medical history of barrett's esophagus, HTN, CHF, GERD, gout, heart failure, CAD.  Planning to start R-CHOP  Met with patient and wife in clinic.  Reports during the first week of Feb, started with cold like symptoms sore throat, etc and decreased intake.  Oral intake has continued to be low until  last week.  Last week intake has improved some.  Does not have a taste for meats, rice, pasta currently.  Today was able to eat cereal for breakfast. Lunch was cup of vegetable soup with grilled cheese.  Yesterday ate most of a baked potato and cereal.  Has oral nutrition supplements on hand to drink.  Drinks about 2 1/2 Liters of water daily    Nutrition Focused Physical Exam:   Orbital Region: WNL Buccal Region: WNL Upper Arm Region: WNL Thoracic and Lumbar Region: WNL Temple Region: WNL Clavicle Bone Region: WNL Shoulder and Acromion Bone Region: WNL Scapular Bone Region: WNL Dorsal Hand: WNL Patellar Region: WNL Anterior Thigh Region: WNL Posterior Calf Region: WNL Edema (RD assessment): WNL Hair: observed Eyes: observed Mouth: observed Skin: observed Nails: observed   Medications: lasix, protonix, predinsone, ondansetron, prochloraperazine   Labs: reviewed   Anthropometrics:   Height: 70.5 inches Weight: 235 lb 3/5 20 lb in the last 2 weeks BMI: 33  8% weight loss in the last 2 weeks, significant   Estimated Energy Needs  Kcals: 2300-2650 Protein: 115-132 g Fluid: > 2 L   NUTRITION DIAGNOSIS: Unintentional weight loss related to cancer as evidenced by 8% weight loss in the last 2 weeks and eating less than 50% of estimated energy needs for more than 5 days.    MALNUTRITION DIAGNOSIS: Patient meets criteria for severe malnutrition in context of acute  illness as evidenced by 8% weight loss in 2 weeks and eating less than 50% of estimated energy needs in > 5 days   INTERVENTION:  Discussed high calorie, high protein diet.  Handout provided Samples of ensure complete provided and encouraged 350 calorie shake Encouraged small frequent meals, including protein source.  Examples discussed Discouraged use of herbal products during treatment.  Contact information provided   MONITORING, EVALUATION, GOAL: weight trends, intake   Next Visit: to be determined with treatment  Joshus Rogan B. Freida Busman, RD, LDN Registered Dietitian [phone]

## 2024-02-15 LAB — EPSTEIN-BARR VIRUS (EBV) ANTIBODY PROFILE
EBV NA IgG: 400 U/mL — ABNORMAL HIGH (ref 0.0–17.9)
EBV VCA IgG: >600 U/mL — ABNORMAL HIGH (ref 0.0–17.9)
EBV VCA IgM: <36 U/mL (ref 0.0–35.9)

## 2024-02-15 LAB — HEPATITIS B CORE ANTIBODY, IGM: Hep B C IgM: NONREACTIVE

## 2024-02-19 ENCOUNTER — Encounter: Payer: Self-pay | Admitting: Internal Medicine

## 2024-02-19 ENCOUNTER — Inpatient Hospital Stay (HOSPITAL_BASED_OUTPATIENT_CLINIC_OR_DEPARTMENT_OTHER): Admitting: Internal Medicine

## 2024-02-19 ENCOUNTER — Inpatient Hospital Stay

## 2024-02-19 VITALS — BP 89/58 | HR 106 | Temp 97.6°F | Resp 24 | Ht 70.5 in | Wt 235.1 lb

## 2024-02-19 DIAGNOSIS — Z800 Family history of malignant neoplasm of digestive organs: Secondary | ICD-10-CM | POA: Diagnosis not present

## 2024-02-19 DIAGNOSIS — I9589 Other hypotension: Secondary | ICD-10-CM

## 2024-02-19 DIAGNOSIS — C8330 Diffuse large B-cell lymphoma, unspecified site: Secondary | ICD-10-CM | POA: Insufficient documentation

## 2024-02-19 DIAGNOSIS — I959 Hypotension, unspecified: Secondary | ICD-10-CM | POA: Diagnosis not present

## 2024-02-19 DIAGNOSIS — I10 Essential (primary) hypertension: Secondary | ICD-10-CM | POA: Diagnosis not present

## 2024-02-19 DIAGNOSIS — C8338 Diffuse large B-cell lymphoma, lymph nodes of multiple sites: Secondary | ICD-10-CM

## 2024-02-19 DIAGNOSIS — C8333 Diffuse large B-cell lymphoma, intra-abdominal lymph nodes: Secondary | ICD-10-CM

## 2024-02-19 DIAGNOSIS — Z9081 Acquired absence of spleen: Secondary | ICD-10-CM | POA: Diagnosis not present

## 2024-02-19 DIAGNOSIS — C884 Extranodal marginal zone b-cell lymphoma of mucosa-associated lymphoid tissue (malt-lymphoma) not having achieved remission: Secondary | ICD-10-CM

## 2024-02-19 DIAGNOSIS — K2270 Barrett's esophagus without dysplasia: Secondary | ICD-10-CM | POA: Diagnosis not present

## 2024-02-19 LAB — CBC WITH DIFFERENTIAL (CANCER CENTER ONLY)
Abs Immature Granulocytes: 0.08 10*3/uL — ABNORMAL HIGH (ref 0.00–0.07)
Basophils Absolute: 0 10*3/uL (ref 0.0–0.1)
Basophils Relative: 0 %
Eosinophils Absolute: 0.1 10*3/uL (ref 0.0–0.5)
Eosinophils Relative: 1 %
HCT: 29.6 % — ABNORMAL LOW (ref 39.0–52.0)
Hemoglobin: 10 g/dL — ABNORMAL LOW (ref 13.0–17.0)
Immature Granulocytes: 1 %
Lymphocytes Relative: 14 %
Lymphs Abs: 1.3 10*3/uL (ref 0.7–4.0)
MCH: 35.3 pg — ABNORMAL HIGH (ref 26.0–34.0)
MCHC: 33.8 g/dL (ref 30.0–36.0)
MCV: 104.6 fL — ABNORMAL HIGH (ref 80.0–100.0)
Monocytes Absolute: 0.7 10*3/uL (ref 0.1–1.0)
Monocytes Relative: 7 %
Neutro Abs: 7.3 10*3/uL (ref 1.7–7.7)
Neutrophils Relative %: 77 %
Platelet Count: 322 10*3/uL (ref 150–400)
RBC: 2.83 MIL/uL — ABNORMAL LOW (ref 4.22–5.81)
RDW: 14.4 % (ref 11.5–15.5)
WBC Count: 9.5 10*3/uL (ref 4.0–10.5)
nRBC: 0.5 % — ABNORMAL HIGH (ref 0.0–0.2)

## 2024-02-19 LAB — CMP (CANCER CENTER ONLY)
ALT: 42 U/L (ref 0–44)
AST: 49 U/L — ABNORMAL HIGH (ref 15–41)
Albumin: 2.9 g/dL — ABNORMAL LOW (ref 3.5–5.0)
Alkaline Phosphatase: 48 U/L (ref 38–126)
Anion gap: 11 (ref 5–15)
BUN: 33 mg/dL — ABNORMAL HIGH (ref 8–23)
CO2: 26 mmol/L (ref 22–32)
Calcium: 8.9 mg/dL (ref 8.9–10.3)
Chloride: 95 mmol/L — ABNORMAL LOW (ref 98–111)
Creatinine: 1.18 mg/dL (ref 0.61–1.24)
GFR, Estimated: >60 mL/min (ref >60)
Glucose, Bld: 126 mg/dL — ABNORMAL HIGH (ref 70–99)
Potassium: 4 mmol/L (ref 3.5–5.1)
Sodium: 132 mmol/L — ABNORMAL LOW (ref 135–145)
Total Bilirubin: 1.2 mg/dL (ref 0.0–1.2)
Total Protein: 6 g/dL — ABNORMAL LOW (ref 6.5–8.1)

## 2024-02-19 MED ORDER — PREDNISONE 50 MG PO TABS: ORAL_TABLET | ORAL | 6 refills | Status: DC

## 2024-02-19 MED ORDER — ACYCLOVIR 400 MG PO TABS: 400.0000 mg | ORAL_TABLET | Freq: Two times a day (BID) | ORAL | 2 refills | Status: DC

## 2024-02-19 MED FILL — Fosaprepitant Dimeglumine For IV Infusion 150 MG (Base Eq): INTRAVENOUS | Qty: 5 | Status: AC

## 2024-02-19 NOTE — Progress Notes (Signed)
 C/o being tired and weak, sleeping a lot. No appetite, drinking 1 boost per day. Goes from being either hot or cold x3 days.  C/o back pain 5/10.

## 2024-02-19 NOTE — Progress Notes (Signed)
 [facility] CONSULT NOTE  Patient Care Team: Marjie Skiff, NP as PCP - General (Nurse Practitioner) End, Cristal Deer, MD as PCP - Cardiology (Cardiology) Minor, Theadora Rama, RN (Inactive) as [facility] Care Management Donneta Romberg, Worthy Flank, MD as Consulting Physician (Oncology) Jaynie Collins, DO as Consulting Physician (Gastroenterology)  CHIEF COMPLAINTS/PURPOSE OF CONSULTATION: Lymphoma  #  Oncology History Overview Note  # NOV 2019- MALT [II opinion at [facility] path]; s/p sigmoid colon polyp resection [Dr.Skulskie; incidental]; PET scan -NED  # Barrett's esophagus- 2018;#Colonic polyps surveillance/Dr. Marva Panda ; s/p  EGD & Colo- Jan 2020  FEB 20th, 2025-  Numerous new bulky lymph nodes, predominantly in the neck and chest although also present in the abdomen and pelvis, consistent with recurrent lymphoma. 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  Left Axillary excisional lymph node biopsy on 2/28- Path- Discussed with pathology Dr.Smir-pathologically highly suspicious of EBV associated diffuse large B-cell lymphoma.  However B-cell clonality/and also FISH panel pending.  ABC subtype. Once path is confirmed proceed with Pola-RCHP.  Hepatitis panel negative-EBV IgG positive however IgM negative.    # MARCH 12th, 2025- Rituxan-Prednisone.   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) (HCC)   02/13/2024 Cancer Staging   Staging form: Hodgkin and Non-Hodgkin Lymphoma, AJCC 8th Edition - Clinical: Stage III - Signed by Earna Coder, MD on  02/13/2024   02/20/2024 -  Chemotherapy   Patient is on Treatment Plan : NON-HODGKINS LYMPHOMA R-CHOP q21d     DLBCL (diffuse large B cell lymphoma) (HCC)  02/19/2024 Initial Diagnosis   DLBCL (diffuse large B cell lymphoma) (HCC)   02/19/2024 Cancer Staging   Staging form: Hodgkin and Non-Hodgkin Lymphoma, AJCC 8th Edition - Clinical: Stage III (Diffuse large B-cell lymphoma) - Signed by Earna Coder, MD on 02/19/2024      HISTORY OF PRESENTING ILLNESS: with wife.  Ambulating dependently.  Jesse Norton 78 y.o.  male is here for follow-up of his MALT lymphoma of the colon/polyp status post resection-; recent worsening neck adenopathy/and abdominal adenopathy-is here today to review the pathology and proceed with treatment.  C/o being tired and weak, sleeping a lot. No appetite, drinking 1 boost per day. Goes from being either hot or cold x3 days.  Patient status post prednisone 100 mg a day for 5 days.  Patient continues to have swelling and pain of his throat.  Noticed to have night sweats, not drenching.    Review of Systems  Constitutional:  Positive for malaise/fatigue. Negative for chills, diaphoresis, fever and weight loss.  HENT:  Negative for nosebleeds and sore throat.   Eyes:  Negative for double vision.  Respiratory:  Negative for cough, hemoptysis, sputum production, shortness of breath and wheezing.   Cardiovascular:  Negative for chest pain, palpitations, orthopnea and leg swelling.  Gastrointestinal:  Negative for abdominal pain, blood in stool, constipation, diarrhea, heartburn, melena, nausea and vomiting.  Genitourinary:  Negative for dysuria, frequency and urgency.  Musculoskeletal:  Negative for back pain and joint pain.  Skin: Negative.  Negative for itching and rash.  Neurological:  Negative for dizziness, tingling, focal weakness, weakness and headaches.  Endo/Heme/Allergies:  Does not bruise/bleed easily.  Psychiatric/Behavioral:  Negative for  depression. The patient is not nervous/anxious and does not have insomnia.      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   a.) TTE 07/16/2023: 38 mm   Barrett's esophagus    BPH (benign prostatic hyperplasia)    CAD (coronary artery disease) 10/2018   a.) ETT 11/06/2018: 1mm horizontal inflat ST depression, freq PVC's -> intermediate risk; b.) cCTA 12/23/2018: Ca2+ 2408 (93rd %'ile); FFR 0.6 mLAD, 0.87 mLCx, 0.77 dLCx --> cath recommended; c.) LHC/PCI 01/01/2019:  40% pLAD, 85% mLAD, 70% D2, 70% pLCx (2.75 x 15 mm Resolute Onyx DES), RCA min irregs -- staged PCI; d.) staged PCI 01/21/2019: orbital athrectomy + 3.0 x 15 mm Resolute Onyx DES mLAD   Chronic heart failure with preserved ejection fraction (HCC)    Colon polyps    Duodenal adenoma    Gastritis    GERD (gastroesophageal reflux disease)    Gout    H. pylori infection    H/O urethral stricture    Hemorrhoids    Hiatal hernia    History of bilateral cataract extraction 2023   History of splenectomy 1957   Hypertension    Mucosa-associated lymphoid tissue (MALT) lymphoma    Pre-diabetes    Pulmonary emphysema (HCC)    Stomach ulcer     SURGICAL HISTORY: Past Surgical History:  Procedure Laterality Date   CATARACT EXTRACTION W/PHACO Left 03/07/2022   Procedure: CATARACT EXTRACTION PHACO AND INTRAOCULAR LENS PLACEMENT (IOC) LEFT VIVITY TORIC LENS 10.41 00:55.7;  Surgeon: Galen Manila, MD;  Location: [facility];  Service: Ophthalmology;  Laterality: Left;   CATARACT EXTRACTION W/PHACO Right 03/28/2022   Procedure: CATARACT EXTRACTION PHACO AND INTRAOCULAR LENS PLACEMENT (IOC) RIGHT VIVITY LENS 6.68 00:50.0;  Surgeon: Galen Manila, MD;  Location: [facility];  Service:  Ophthalmology;  Laterality: Right;   COLONOSCOPY     COLONOSCOPY N/A 06/19/2022   Procedure: COLONOSCOPY;  Surgeon: Jaynie Collins, DO;  Location: [facility] ENDOSCOPY;  Service: Gastroenterology;  Laterality: N/A;   COLONOSCOPY WITH PROPOFOL N/A 08/20/2018   Procedure: COLONOSCOPY WITH PROPOFOL;  Surgeon: Christena Deem, MD;  Location: [facility] ENDOSCOPY;  Service: Endoscopy;  Laterality: N/A;   COLONOSCOPY WITH PROPOFOL N/A 12/19/2018   Procedure: COLONOSCOPY WITH PROPOFOL;  Surgeon: Christena Deem, MD;  Location: [facility] ENDOSCOPY;  Service: Endoscopy;  Laterality: N/A;   CORONARY ATHERECTOMY N/A 02/03/2019   Procedure: CORONARY ATHERECTOMY;  Surgeon: Yvonne Kendall, MD;  Location: [facility] INVASIVE CV LAB;  Service: Cardiovascular;  Laterality: N/A;   CORONARY STENT INTERVENTION N/A 01/21/2019   Procedure: CORONARY STENT INTERVENTION;  Surgeon: Yvonne Kendall, MD;  Location: [facility] INVASIVE CV LAB;  Service: Cardiovascular;  Laterality: N/A;   CORONARY STENT INTERVENTION N/A 02/03/2019   Procedure: CORONARY STENT INTERVENTION;  Surgeon: Yvonne Kendall, MD;  Location: [facility] INVASIVE CV LAB;  Service: Cardiovascular;  Laterality: N/A;   CORONARY ULTRASOUND/IVUS N/A 02/03/2019   Procedure: Intravascular Ultrasound/IVUS;  Surgeon: Yvonne Kendall, MD;  Location: [facility]VASIVE CV LAB;  Service: Cardiovascular;  Laterality: N/A;   CYSTOSCOPY WITH URETHRAL DILATATION N/A 08/16/2018   Procedure: CYSTOSCOPY WITH URETHRAL DILATATION;  Surgeon: Sondra Come, MD;  Location: [facility] ORS;  Service: Urology;  Laterality: N/A;   ESOPHAGOGASTRODUODENOSCOPY N/A  01/29/2017   Procedure: ESOPHAGOGASTRODUODENOSCOPY (EGD);  Surgeon: Christena Deem, MD;  Location: [facility] ENDOSCOPY;  Service: Endoscopy;  Laterality: N/A;   ESOPHAGOGASTRODUODENOSCOPY N/A 06/19/2022   Procedure: ESOPHAGOGASTRODUODENOSCOPY (EGD);  Surgeon: Jaynie Collins, DO;  Location: [facility] ENDOSCOPY;  Service: Gastroenterology;  Laterality: N/A;    ESOPHAGOGASTRODUODENOSCOPY (EGD) WITH PROPOFOL N/A 07/10/2016   Procedure: ESOPHAGOGASTRODUODENOSCOPY (EGD) WITH PROPOFOL;  Surgeon: Christena Deem, MD;  Location: [facility] ENDOSCOPY;  Service: Endoscopy;  Laterality: N/A;   ESOPHAGOGASTRODUODENOSCOPY (EGD) WITH PROPOFOL N/A 12/19/2018   Procedure: ESOPHAGOGASTRODUODENOSCOPY (EGD) WITH PROPOFOL;  Surgeon: Christena Deem, MD;  Location: [facility] ENDOSCOPY;  Service: Endoscopy;  Laterality: N/A;   left elbow repair Left    LEFT HEART CATH AND CORONARY ANGIOGRAPHY Left 01/21/2019   Procedure: LEFT HEART CATH AND CORONARY ANGIOGRAPHY;  Surgeon: Yvonne Kendall, MD;  Location: [facility] INVASIVE CV LAB;  Service: Cardiovascular;  Laterality: Left;   LYMPH NODE DISSECTION Left 02/08/2024   Procedure: LYMPH NODE DISSECTION;  Surgeon: Carolan Shiver, MD;  Location: [facility] ORS;  Service: General;  Laterality: Left;   PORTACATH PLACEMENT N/A 02/13/2024   Procedure: INSERTION, TUNNELED CENTRAL VENOUS DEVICE, WITH PORT;  Surgeon: Carolan Shiver, MD;  Location: [facility] ORS;  Service: General;  Laterality: N/A;   SPLENECTOMY  1957   TONSILLECTOMY     as a child    SOCIAL HISTORY: Social History   Socioeconomic History   Marital status: Married    Spouse name: Jesse Norton   Number of children: 2   Years of education: Not on file   Highest education level: Master's degree (e.g., MA, MS, MEng, MEd, MSW, MBA)  Occupational History   Occupation: retired     Comment: Psychologist, occupational  Tobacco Use   Smoking status: Never   Smokeless tobacco: Never  Vaping Use   Vaping status: Never Used  Substance and Sexual Activity   Alcohol use: Not Currently   Drug use: No   Sexual activity: Yes  Other Topics Concern   Not on file  Social History Narrative   Married, 2 sons, 1 in [place], [place] and 1 in [employer], stationary bike      No smoking.  Occasional alcohol.  Lives at home.  Retired Psychologist, occupational.   Social Drivers of Corporate investment banker Strain:  Low Risk  (01/29/2024)   Received from [facility]   Overall Financial Resource Strain (CARDIA)    Difficulty of Paying Living Expenses: Not hard at all  Food Insecurity: No Food Insecurity (01/29/2024)   Received from [facility]   Hunger Vital Sign    Worried About Running Out of Food in the Last Year: Never true    Ran Out of Food in the Last Year: Never true  Transportation Needs: No Transportation Needs (01/29/2024)   Received from [facility] - Transportation    In the past 12 months, has lack of transportation kept you from medical appointments or from getting medications?: No    Lack of Transportation (Non-Medical): No  Physical Activity: Insufficiently Active (07/17/2023)   Exercise Vital Sign    Days of Exercise per Week: 4 days    Minutes of Exercise per Session: 30 min  Stress: No Stress Concern Present (07/17/2023)   [employer] of Occupational Health - Occupational Stress Questionnaire    Feeling of Stress : Only a little  Social Connections: Moderately Integrated (07/17/2023)   Social Connection and Isolation Panel [NHANES]  Frequency of Communication with Friends and Family: Three times a week    Frequency of Social Gatherings with Friends and Family: Three times a week    Attends Religious Services: More than 4 times per year    Active Member of Clubs or Organizations: No    Attends Banker Meetings: Never    Marital Status: Married  Catering manager Violence: Not At Risk (07/17/2023)   Humiliation, Afraid, Rape, and Kick questionnaire    Fear of Current or Ex-Partner: No    Emotionally Abused: No    Physically Abused: No    Sexually Abused: No    FAMILY HISTORY: Family History  Adopted: Yes  Problem Relation Age of Onset   Heart attack Mother 22   Pancreatic cancer Father        died in WWII - pt adopted   Hypertension Sister    Hyperlipidemia Sister    Hypertension Brother     Hyperlipidemia Brother    Prostate cancer Neg Hx    Kidney cancer Neg Hx    Bladder Cancer Neg Hx     ALLERGIES:  has no known allergies.  MEDICATIONS:  Current Outpatient Medications  Medication Sig Dispense Refill   acyclovir (ZOVIRAX) 400 MG tablet Take 1 tablet (400 mg total) by mouth 2 (two) times daily. 120 tablet 2   allopurinol (ZYLOPRIM) 300 MG tablet Take 1 tablet (300 mg total) by mouth 2 (two) times daily. 120 tablet 1   amLODipine (NORVASC) 10 MG tablet TAKE 1 TABLET(10 MG) BY MOUTH DAILY 90 tablet 4   atorvastatin (LIPITOR) 40 MG tablet TAKE 1 TABLET(40 MG) BY MOUTH DAILY 90 tablet 4   Blood Glucose Monitoring Suppl (ONETOUCH VERIO) w/Device KIT To check blood sugar twice a day with goal fasting in morning <130 and goal 2 hours after meal <180.  Write all checks down for provider visits. 1 kit 2   clobetasol ointment (TEMOVATE) 0.05 % Apply topically as needed.     clopidogrel (PLAVIX) 75 MG tablet TAKE 1 TABLET(75 MG) BY MOUTH DAILY WITH BREAKFAST 90 tablet 0   furosemide (LASIX) 20 MG tablet Take 1 tablet (20 mg total) by mouth daily. 90 tablet 4   glucose blood test strip To check blood sugar twice a day with goal fasting in morning <130 and goal 2 hours after meal <180.  Write all checks down for provider visits. 100 each 2   HYDROcodone-acetaminophen (NORCO/VICODIN) 5-325 MG tablet Take 1 tablet by mouth every 6 (six) hours as needed. 45 tablet 0   Lancets (ONETOUCH ULTRASOFT) lancets 1 each by Other route 2 (two) times daily. 100 each 12   lidocaine-prilocaine (EMLA) cream Apply on the port. 30 -45 min  prior to port access. 30 g 3   losartan (COZAAR) 100 MG tablet TAKE 1 TABLET(100 MG) BY MOUTH DAILY 90 tablet 4   metoprolol tartrate (LOPRESSOR) 25 MG tablet Take 1 tablet (25 mg total) by mouth 2 (two) times daily. 180 tablet 4   nitroGLYCERIN (NITROSTAT) 0.4 MG SL tablet Place 1 tablet (0.4 mg total) under the tongue every 5 (five) minutes as needed for chest pain.  Maximum of 3 doses. 25 tablet 3   ondansetron (ZOFRAN) 8 MG tablet One pill every 8 hours as needed for nausea/vomitting. 40 tablet 1   pantoprazole (PROTONIX) 20 MG tablet TAKE 1 TABLET(20 MG) BY MOUTH TWICE DAILY 180 tablet 4   prochlorperazine (COMPAZINE) 10 MG tablet Take 1 tablet (10 mg total) by mouth  every 6 (six) hours as needed for nausea or vomiting. 40 tablet 1   predniSONE (DELTASONE) 50 MG tablet Take 2 tablets once day x 5 days.  Take in AM; with FOOD. 10 tablet 6   No current facility-administered medications for this visit.    PHYSICAL EXAMINATION: ECOG PERFORMANCE STATUS: 0 - Asymptomatic  Vitals:   02/19/24 0944  BP: (!) 89/58  Pulse: (!) 106  Resp: (!) 24  Temp: 97.6 F (36.4 C)  SpO2: 96%    Filed Weights   02/19/24 0944  Weight: 235 lb 1.6 oz (106.6 kg)   Bulky adenopathy in the neck and also in the underarms.  Physical Exam HENT:     Head: Normocephalic and atraumatic.     Mouth/Throat:     Pharynx: No oropharyngeal exudate.  Eyes:     Pupils: Pupils are equal, round, and reactive to light.  Cardiovascular:     Rate and Rhythm: Normal rate and regular rhythm.  Pulmonary:     Effort: No respiratory distress.     Breath sounds: No wheezing.  Abdominal:     General: Bowel sounds are normal. There is no distension.     Palpations: Abdomen is soft. There is no mass.     Tenderness: There is no abdominal tenderness. There is no guarding or rebound.  Musculoskeletal:        General: No tenderness. Normal range of motion.     Cervical back: Normal range of motion and neck supple.  Skin:    General: Skin is warm.  Neurological:     Mental Status: He is alert and oriented to person, place, and time.  Psychiatric:        Mood and Affect: Affect normal.      LABORATORY DATA:  I have reviewed the data as listed Lab Results  Component Value Date   WBC 9.5 02/19/2024   HGB 10.0 (L) 02/19/2024   HCT 29.6 (L) 02/19/2024   MCV 104.6 (H) 02/19/2024    PLT 322 02/19/2024   Recent Labs    01/18/24 1014 02/11/24 0922 02/19/24 0937  NA 132* 135 132*  K 4.1 3.8 4.0  CL 93* 99 95*  CO2 26 24 26   GLUCOSE 105* 137* 126*  BUN 24* 19 33*  CREATININE 1.17 1.06 1.18  CALCIUM 8.8* 8.7* 8.9  GFRNONAA >60 >60 >60  PROT 7.4 6.4* 6.0*  ALBUMIN 4.1 3.1* 2.9*  AST 65* 48* 49*  ALT 44 31 42  ALKPHOS 65 52 48  BILITOT 1.8* 0.8 1.2    RADIOGRAPHIC STUDIES: I have personally reviewed the radiological images as listed and agreed with the findings in the report. DG Chest Port 1 View Result Date: 02/13/2024 CLINICAL DATA:  Recurrent lymphoma, port catheter placement EXAM: PORTABLE CHEST 1 VIEW COMPARISON:  07/03/2018 FINDINGS: Right IJ power port catheter tip mid SVC level. No effusion or pneumothorax. Low lung volumes. Bibasilar atelectasis. Negative for edema. Mediastinal and hilar adenopathy better appreciated by comparison PET-CT. Aorta atherosclerotic. Trachea midline. IMPRESSION: 1. Right IJ port catheter tip mid SVC level. No pneumothorax. 2. Low lung volumes with bibasilar atelectasis. Electronically Signed   By: Judie Petit.  Shick M.D.   On: 02/13/2024 13:40   DG C-Arm 1-60 Min-No Report Result Date: 02/13/2024 Fluoroscopy was utilized by the requesting physician.  No radiographic interpretation.   ECHOCARDIOGRAM COMPLETE Result Date: 02/12/2024    ECHOCARDIOGRAM REPORT   Patient Name:   ESIAS MORY Date of Exam: 02/12/2024 Medical Rec #:  [id number]        Height:       70.5 in Accession #:    [id number]       Weight:       235.0 lb Date of Birth:  02-14-1946        BSA:          2.247 m Patient Age:    78 years         BP:           116/72 mmHg Patient Gender: M                HR:           86 bpm. Exam Location:  [facility] Procedure: 2D Echo, Color Doppler, Cardiac Doppler and Intracardiac            Opacification Agent (Both Spectral and Color Flow Doppler were            utilized during procedure). Indications:    R06.9 DOE; Z51.11 Encounter for  antineoplastic chemotheraphy  History:        Patient has prior history of Echocardiogram examinations, most                 recent 07/16/2023. CHF, CAD, Signs/Symptoms:Dyspnea and Shortness                 of Breath; Risk Factors:Hypertension, Dyslipidemia and                 Non-Smoker.  Sonographer:    Ilda Mori MHA, BS, RDCS Referring Phys: [phone] CHRISTOPHER END  Sonographer Comments: Technically challenging study due to limited acoustic windows and patient is obese. Image acquisition challenging due to patient body habitus. IMPRESSIONS  1. Left ventricular ejection fraction, by estimation, is 60 to 65%. The left ventricle has normal function. The left ventricle has no regional wall motion abnormalities. Left ventricular diastolic parameters are consistent with Grade I diastolic dysfunction (impaired relaxation).  2. Right ventricular systolic function is normal. The right ventricular size is normal. There is normal pulmonary artery systolic pressure. The estimated right ventricular systolic pressure is 21.8 mmHg.  3. Left atrial size was mildly dilated.  4. The mitral valve is normal in structure. No evidence of mitral valve regurgitation. No evidence of mitral stenosis.  5. The aortic valve is normal in structure. There is mild calcification of the aortic valve. Aortic valve regurgitation is not visualized. Aortic valve sclerosis is present, with no evidence of aortic valve stenosis.  6. The inferior vena cava is normal in size with greater than 50% respiratory variability, suggesting right atrial pressure of 3 mmHg. FINDINGS  Left Ventricle: Left ventricular ejection fraction, by estimation, is 60 to 65%. The left ventricle has normal function. The left ventricle has no regional wall motion abnormalities. Strain was performed and the global longitudinal strain is indeterminate. The left ventricular internal cavity size was normal in size. There is no left ventricular hypertrophy. Left ventricular diastolic  parameters are consistent with Grade I diastolic dysfunction (impaired relaxation). Right Ventricle: The right ventricular size is normal. No increase in right ventricular wall thickness. Right ventricular systolic function is normal. There is normal pulmonary artery systolic pressure. The tricuspid regurgitant velocity is 2.05 m/s, and  with an assumed right atrial pressure of 5 mmHg, the estimated right ventricular systolic pressure is 21.8 mmHg. Left Atrium: Left atrial size was mildly dilated. Right Atrium: Right atrial size was normal in size. Pericardium: There is no evidence  of pericardial effusion. Mitral Valve: The mitral valve is normal in structure. No evidence of mitral valve regurgitation. No evidence of mitral valve stenosis. Tricuspid Valve: The tricuspid valve is normal in structure. Tricuspid valve regurgitation is mild . No evidence of tricuspid stenosis. Aortic Valve: The aortic valve is normal in structure. There is mild calcification of the aortic valve. Aortic valve regurgitation is not visualized. Aortic valve sclerosis is present, with no evidence of aortic valve stenosis. Aortic valve mean gradient  measures 4.0 mmHg. Aortic valve peak gradient measures 7.0 mmHg. Pulmonic Valve: The pulmonic valve was normal in structure. Pulmonic valve regurgitation is not visualized. No evidence of pulmonic stenosis. Aorta: The aortic root is normal in size and structure. Venous: The inferior vena cava is normal in size with greater than 50% respiratory variability, suggesting right atrial pressure of 3 mmHg. IAS/Shunts: No atrial level shunt detected by color flow Doppler. Additional Comments: 3D was performed not requiring image post processing on an independent workstation and was indeterminate.  LEFT VENTRICLE PLAX 2D LVIDd:         4.60 cm Diastology LVIDs:         3.80 cm LV e' medial:    8.92 cm/s LV PW:         0.80 cm LV E/e' medial:  6.1 LV IVS:        0.80 cm LV e' lateral:   8.49 cm/s                         LV E/e' lateral: 6.4  RIGHT VENTRICLE RV S prime:     17.00 cm/s TAPSE (M-mode): 3.3 cm LEFT ATRIUM             Index LA diam:        4.90 cm 2.18 cm/m LA Vol (A2C):   56.8 ml 25.28 ml/m LA Vol (A4C):   34.6 ml 15.40 ml/m LA Biplane Vol: 44.5 ml 19.80 ml/m  AORTIC VALVE AV Vmax:           132.00 cm/s AV Vmean:          87.100 cm/s AV VTI:            0.211 m AV Peak Grad:      7.0 mmHg AV Mean Grad:      4.0 mmHg LVOT Vmax:         121.00 cm/s LVOT Vmean:        78.100 cm/s LVOT VTI:          0.205 m LVOT/AV VTI ratio: 0.97  AORTA Ao Root diam: 3.50 cm Ao Asc diam:  3.80 cm MITRAL VALVE               TRICUSPID VALVE MV Area (PHT): 3.42 cm    TR Peak grad:   16.8 mmHg MV Decel Time: 222 msec    TR Vmax:        205.00 cm/s MV E velocity: 54.50 cm/s MV A velocity: 93.80 cm/s  SHUNTS MV E/A ratio:  0.58        Systemic VTI: 0.20 m Julien Nordmann MD Electronically signed by Julien Nordmann MD Signature Date/Time: 02/12/2024/7:32:02 PM    Final    CT SOFT TISSUE NECK W CONTRAST Result Date: 02/09/2024 CLINICAL DATA:  Hematologic malignancy. Malt lymphoma. New lymphadenopathy. EXAM: CT NECK WITH CONTRAST TECHNIQUE: Multidetector CT imaging of the neck was performed using the standard protocol following the bolus administration of  intravenous contrast. RADIATION DOSE REDUCTION: This exam was performed according to the departmental dose-optimization program which includes automated exposure control, adjustment of the mA and/or kV according to patient size and/or use of iterative reconstruction technique. CONTRAST:  OMNIPAQUE IOHEXOL 300 MG/ML  SOLN COMPARISON:  PET scan 11/11/2018 FINDINGS: Pharynx and larynx: Lymphoid tissue of Waldeyer's ring is prominent but there is no discrete mucosal or submucosal mass lesion seen otherwise. Salivary glands: Parotid and submandibular glands are intrinsically normal. There are a few small intraparotid lymph nodes noted, left more than right. Thyroid: No thyroid  lesion. Lymph nodes: Massive widespread bilateral lymphadenopathy. Index nodes are as follows. Right level 1 submandibular node axial image 77 measures 10 x 16 mm. Massive bilateral level 2 lymphadenopathy. Anterior level 2 node on the left axial image 64 measures 24 x 34 mm. Right level 2 lymph node on the right axial image 68 measures 25 x 30 mm. Left supraclavicular lymph node axial image 80 for measures 27 x 39 mm. Right supraclavicular lymph node axial image 83 measures 18 x 19 mm. Large cluster of level 2 lymph nodes on each side measure in total 64 x 65 mm on the left and 53 x 55 mm on the right. Vascular: Tortuous carotid arteries. Atherosclerotic calcification of the carotid bifurcations but no likely stenosis. Marked compression of the left jugular vein but without frank occlusion. Limited intracranial: Normal Visualized orbits: Normal Mastoids and visualized paranasal sinuses: Clear except for mild mucosal thickening and a small air-fluid level in the right maxillary sinus. Skeleton: Chronic degenerative spondylosis of the cervical spine. Upper chest: Mild scarring at the lung apices. Pronounced mediastinal lymphadenopathy. Index right paratracheal lymph node axial image 116 measures 25 x 33 mm. Other: None IMPRESSION: 1. Massive widespread bilateral lymphadenopathy in the neck and chest. 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 Index nodes as above. 2. Marked compression of the left jugular vein but without frank occlusion. 3. Mild mucosal thickening and a small air-fluid level in the right maxillary sinus. Aortic Atherosclerosis (ICD10-I70.0). Electronically Signed   By: Paulina Fusi M.D.   On: 02/09/2024 13:30   NM PET Image Restage (PS) Skull Base to Thigh (F-18 FDG) Result Date: 02/09/2024 CLINICAL DATA:  Subsequent treatment strategy for recurrent lymphoma. EXAM: NUCLEAR MEDICINE PET SKULL BASE TO THIGH TECHNIQUE: 12.51 mCi F-18 FDG was injected intravenously. Full-ring PET imaging was  performed from the skull base to thigh after the radiotracer. CT data was obtained and used for attenuation correction and anatomic localization. Fasting blood glucose: 109 mg/dl COMPARISON:  Prior PET-CT 11/11/2018 and recent CT scan 01/31/2024 FINDINGS: Mediastinal blood pool activity: SUV max 3.27 Liver activity: SUV max 4.51 NECK: Extensive multi station hypermetabolic cervical lymphadenopathy. Many of these nodes demonstrate low attenuation suggesting necrosis. Index right level 2 lymph node on image 24/6 measures 27 mm and SUV max is 16.28. Left-sided level 2 lymph node on image 18/6 measures 20 mm and SUV max is 12.8. Incidental CT findings: Bilateral carotid artery calcifications. CHEST: Extensive supraclavicular and left axillary lymphadenopathy. Many of the left axillary lymph nodes are partially necrotic. Index left axillary node on image 54/6 measures 26 mm and SUV max is 15.73. Extensive hypermetabolic mediastinal and hilar lymphadenopathy. 3 cm precarinal lymph node on image 49/6 has an SUV max of 16.95. No worrisome hypermetabolic pulmonary lesions. Incidental CT findings: Stable advanced aortic and three-vessel coronary artery calcifications. ABDOMEN/PELVIS: Extensive mesenteric and retroperitoneal hypermetabolic lymphadenopathy. Index 3.6 cm largely necrotic node on image 93/6 has  an SUV max of 10.0. 21 mm right retroperitoneal lymph node on image 92/6 has an SUV max of 21.37. 11 mm right obturator node on image 136/6 has an SUV max of 14.47. 11 mm left inguinal node on image 151/6 has an SUV max of 11.79. Incidental CT findings: Stable advanced vascular disease but no aortic aneurysm. 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 Stable simple bilateral renal cysts not requiring any further imaging evaluation or follow-up. Stable right renal calculus. SKELETON: No findings suspicious for osseous lymphoma. Incidental CT findings: None. IMPRESSION: 1. Extensive partially  necrotic hypermetabolic lymphadenopathy involving the neck, chest, abdomen and pelvis as detailed above. Very similar findings when compared to the prior PET-CT from 2019. [place] 5. 2. No findings suspicious for osseous lymphoma. 3. 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 4. Stable advanced vascular disease. 5. Aortic atherosclerosis. Electronically Signed   By: Rudie Meyer M.D.   On: 02/09/2024 12:14   CT CHEST ABDOMEN PELVIS W CONTRAST Result Date: 01/31/2024 CLINICAL DATA:  Lymphadenopathy, history of MALT lymphoma with new cervical, submandibular, and left axillary adenopathy * Tracking Code: BO * EXAM: CT CHEST, ABDOMEN, AND PELVIS WITH CONTRAST TECHNIQUE: Multidetector CT imaging of the chest, abdomen and pelvis was performed following the standard protocol during bolus administration of intravenous contrast. RADIATION DOSE REDUCTION: This exam was performed according to the departmental dose-optimization program which includes automated exposure control, adjustment of the mA and/or kV according to patient size and/or use of iterative reconstruction technique. CONTRAST:  OMNIPAQUE IOHEXOL 300 MG/ML SOLN additional oral enteric contrast COMPARISON:  PET-CT, 11/11/2018 FINDINGS: CT CHEST FINDINGS Cardiovascular: Aortic atherosclerosis. Normal heart size. Three-vessel coronary artery calcifications. No pericardial effusion. Mediastinum/Nodes: Numerous new bulky cervical left axillary, subpectoral, and mediastinal lymph nodes, index left supraclavicular node measuring 3.7 x 2.6 cm (series 8, image 10), index left subpectoral node measuring 6.3 x 3.8 cm (series 8, image 30), and index pretracheal node measuring 3.3 x 2.8 cm (series 8, image 26). Thyroid gland, trachea, and esophagus demonstrate no significant findings. Lungs/Pleura: Mild diffuse bilateral bronchial wall thickening. Bibasilar scarring and or atelectasis with elevation of the right hemidiaphragm. Minimal  centrilobular and paraseptal emphysema. No pleural effusion or pneumothorax. Musculoskeletal: No chest wall abnormality. No acute osseous findings. CT ABDOMEN PELVIS FINDINGS Hepatobiliary: No solid liver abnormality is seen. Contracted gallbladder. No gallstones, gallbladder wall thickening, or biliary dilatation. Pancreas: Unremarkable. No pancreatic ductal dilatation or surrounding inflammatory changes. 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 Adrenals/Urinary Tract: Adrenal glands are unremarkable. Kidneys are normal, without renal calculi, solid lesion, or hydronephrosis. Bladder is unremarkable. Stomach/Bowel: Stomach is within normal limits. Appendix appears normal. No evidence of bowel wall thickening, distention, or inflammatory changes. Vascular/Lymphatic: Aortic atherosclerosis. Numerous enlarged upper abdominal lymph nodes, largest gastrohepatic ligament/celiac axis lymph node measuring 2.8 x 2.3 cm (series 8, image 61) largest small bowel mesenteric node measuring 4.0 x 3.9 cm (series 8, image 71). Enlarged bilateral external iliac and pelvic sidewall lymph nodes, measuring up to 2.3 x 1.3 cm on the right (series 8, image 114). Reproductive: No mass or other abnormality. Other: No abdominal wall hernia or abnormality. No ascites. Musculoskeletal: No acute osseous findings. Chronic fracture deformity of the left inferior pubic ramus. Please note that definitively benign incidental findings, functional findings, and anatomic variants may have been intentionally omitted from this report in the interest of brevity and clarity. If not specifically noted and recommended in the impression,  no further follow-up or characterization is required for incidental findings. IMPRESSION: 1. Numerous new bulky lymph nodes, predominantly in the neck and chest although also present in the abdomen and pelvis, consistent with recurrent lymphoma. 2. 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 3. Emphysema. 4. Coronary artery disease. Aortic Atherosclerosis (ICD10-I70.0) and Emphysema (ICD10-J43.9). Electronically Signed   By: Jearld Lesch M.D.   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  Left Axillary excisional lymph node biopsy on 2/28- Path- Discussed with pathology Dr.Smir-pathologically highly suspicious of EBV associated diffuse large B-cell lymphoma.  However B-cell clonality/and also FISH panel pending.  ABC subtype. Once path is confirmed proceed with Pola-RCHP.  Hepatitis panel negative-EBV IgG positive however IgM negative.   # Pending final pathology-given patient ongoing symptoms recommend rituximab prednisone cycle tomorrow.  Will consider chemotherapy based on final pathology with cycle #2. 2D echo-March 4th, 2025- EF: 60-65%   # Hypotension: systolic [80s]- poor PO intake- recommend HOLDING Metoprolol/losartan; Amlodipine-# Recommending checking BP/and Heart rate- and bringing the log to chemo tomorrow; will adjust BP medications. Also HOLD lasix.   # TLS/ ID prophylaxis: Acyclovir 400 mg BID; Allopurinol 300 mg BiD  # IV access: mediport- functional.   # DISPOSITION: # chemo tomorrow-  # cancel D-2 injection # cancel aug 2025- appts # follow up in 1 week or so- MD; labs- cbc/cmp-LDH;possible 1 lit IVFs-  Dr.B   # 40 minutes face-to-face with the patient discussing the above plan of care; more than 50% of time spent on prognosis/ natural history; counseling and  coordination.   All questions were answered. The patient knows to call the clinic with any problems, questions or concerns.    Earna Coder, MD 02/19/2024 4:39 PM

## 2024-02-19 NOTE — Assessment & Plan Note (Addendum)
 FEB 20th, 2025-  Numerous new bulky lymph nodes, predominantly in the neck and chest although also present in the abdomen and pelvis, consistent with recurrent lymphoma. 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  Left Axillary excisional lymph node biopsy on 2/28- Path- Discussed with pathology Dr.Smir-pathologically highly suspicious of EBV associated diffuse large B-cell lymphoma.  However B-cell clonality/and also FISH panel pending.  ABC subtype. Once path is confirmed proceed with Pola-RCHP.  Hepatitis panel negative-EBV IgG positive however IgM negative.   # Pending final pathology-given patient ongoing symptoms recommend rituximab prednisone cycle tomorrow.  Will consider chemotherapy based on final pathology with cycle #2. 2D echo-March 4th, 2025- EF: 60-65%   # Hypotension: systolic [80s]- poor PO intake- recommend HOLDING Metoprolol/losartan; Amlodipine-# Recommending checking BP/and Heart rate- and bringing the log to chemo tomorrow; will adjust BP medications. Also HOLD lasix.   # TLS/ ID prophylaxis: Acyclovir 400 mg BID; Allopurinol 300 mg BiD  # IV access: mediport- functional.   # DISPOSITION: # chemo tomorrow-  # cancel D-2 injection # cancel aug 2025- appts # follow up in 1 week or so- MD; labs- cbc/cmp-LDH;possible 1 lit IVFs-  Dr.B   # 40 minutes face-to-face with the patient discussing the above plan of care; more than 50% of time spent on prognosis/ natural history; counseling and coordination.

## 2024-02-19 NOTE — Patient Instructions (Addendum)
#   recommend HOLDING Metoprolol/losartan; Amlodipine-  Also HOLD lasix.   # Recommending checking BP/and Heart rate- and bringing the log to chemo tomorrow; will adjust BP medications.

## 2024-02-20 ENCOUNTER — Telehealth: Payer: Self-pay | Admitting: *Deleted

## 2024-02-20 ENCOUNTER — Inpatient Hospital Stay

## 2024-02-20 VITALS — BP 98/64 | HR 76 | Temp 97.3°F | Resp 18

## 2024-02-20 DIAGNOSIS — R591 Generalized enlarged lymph nodes: Secondary | ICD-10-CM | POA: Diagnosis not present

## 2024-02-20 DIAGNOSIS — C8338 Diffuse large B-cell lymphoma, lymph nodes of multiple sites: Secondary | ICD-10-CM | POA: Diagnosis not present

## 2024-02-20 DIAGNOSIS — K2270 Barrett's esophagus without dysplasia: Secondary | ICD-10-CM | POA: Diagnosis not present

## 2024-02-20 DIAGNOSIS — I10 Essential (primary) hypertension: Secondary | ICD-10-CM | POA: Diagnosis not present

## 2024-02-20 DIAGNOSIS — Z9081 Acquired absence of spleen: Secondary | ICD-10-CM | POA: Diagnosis not present

## 2024-02-20 DIAGNOSIS — C884 Extranodal marginal zone b-cell lymphoma of mucosa-associated lymphoid tissue (malt-lymphoma) not having achieved remission: Secondary | ICD-10-CM

## 2024-02-20 DIAGNOSIS — I959 Hypotension, unspecified: Secondary | ICD-10-CM | POA: Diagnosis not present

## 2024-02-20 MED ORDER — HEPARIN SOD (PORK) LOCK FLUSH 100 UNIT/ML IV SOLN
500.0000 [IU] | Freq: Once | INTRAVENOUS | Status: AC | PRN
  Administered 2024-02-20: 500 [IU]
  Filled 2024-02-20: qty 5

## 2024-02-20 MED ORDER — SODIUM CHLORIDE 0.9 % IV SOLN
INTRAVENOUS | Status: DC
Start: 2024-02-20 — End: 2024-02-20
  Filled 2024-02-20: qty 250

## 2024-02-20 MED ORDER — DIPHENHYDRAMINE HCL 25 MG PO CAPS
50.0000 mg | ORAL_CAPSULE | Freq: Once | ORAL | Status: AC
  Administered 2024-02-20: 50 mg via ORAL
  Filled 2024-02-20: qty 2

## 2024-02-20 MED ORDER — SODIUM CHLORIDE 0.9 % IV SOLN
375.0000 mg/m2 | Freq: Once | INTRAVENOUS | Status: AC
  Administered 2024-02-20: 900 mg via INTRAVENOUS
  Filled 2024-02-20: qty 40

## 2024-02-20 MED ORDER — DEXAMETHASONE SODIUM PHOSPHATE 10 MG/ML IJ SOLN
10.0000 mg | Freq: Once | INTRAMUSCULAR | Status: AC
  Administered 2024-02-20: 10 mg via INTRAVENOUS
  Filled 2024-02-20: qty 1

## 2024-02-20 MED ORDER — ACETAMINOPHEN 325 MG PO TABS
650.0000 mg | ORAL_TABLET | Freq: Once | ORAL | Status: AC
  Administered 2024-02-20: 650 mg via ORAL
  Filled 2024-02-20: qty 2

## 2024-02-20 NOTE — Patient Instructions (Addendum)
 [facility] - A DEPT OF [facility]  Discharge Instructions: Thank you for choosing [facility] to provide your oncology and hematology care.  If you have a lab appointment with the Cancer Center, please go directly to the Cancer Center and check in at the registration area.  Wear comfortable clothing and clothing appropriate for easy access to any Portacath or PICC line.   We strive to give you quality time with your provider. You may need to reschedule your appointment if you arrive late (15 or more minutes).  Arriving late affects you and other patients whose appointments are after yours.  Also, if you miss three or more appointments without notifying the office, you may be dismissed from the clinic at the provider's discretion.      For prescription refill requests, have your pharmacy contact our office and allow 72 hours for refills to be completed.    Today you received the following chemotherapy and/or immunotherapy agents RITUXUMAB      To help prevent nausea and vomiting after your treatment, we encourage you to take your nausea medication as directed.  BELOW ARE SYMPTOMS THAT SHOULD BE REPORTED IMMEDIATELY: *FEVER GREATER THAN 100.4 F (38 C) OR HIGHER *CHILLS OR SWEATING *NAUSEA AND VOMITING THAT IS NOT CONTROLLED WITH YOUR NAUSEA MEDICATION *UNUSUAL SHORTNESS OF BREATH *UNUSUAL BRUISING OR BLEEDING *URINARY PROBLEMS (pain or burning when urinating, or frequent urination) *BOWEL PROBLEMS (unusual diarrhea, constipation, pain near the anus) TENDERNESS IN MOUTH AND THROAT WITH OR WITHOUT PRESENCE OF ULCERS (sore throat, sores in mouth, or a toothache) UNUSUAL RASH, SWELLING OR PAIN  UNUSUAL VAGINAL DISCHARGE OR ITCHING   Items with * indicate a potential emergency and should be followed up as soon as possible or go to the Emergency Department if any problems should occur.  Please show the CHEMOTHERAPY ALERT CARD or IMMUNOTHERAPY  ALERT CARD at check-in to the Emergency Department and triage nurse.  Should you have questions after your visit or need to cancel or reschedule your appointment, please contact [facility] - A DEPT OF [facility]  [phone] and follow the prompts.  Office hours are 8:00 a.m. to 4:30 p.m. Monday - Friday. Please note that voicemails left after 4:00 p.m. may not be returned until the following business day.  We are closed weekends and major holidays. You have access to a nurse at all times for urgent questions. Please call the main number to the clinic [phone] and follow the prompts.  For any non-urgent questions, you may also contact your provider using MyChart. We now offer e-Visits for anyone 62 and older to request care online for non-urgent symptoms. For details visit [email or web address].   Also download the MyChart app! Go to the app store, search "MyChart", open the app, select [facility], and log in with your MyChart username and password.   Rituximab Injection What is this medication? RITUXIMAB (ri TUX i mab) treats leukemia and lymphoma. It works by blocking a protein that causes cancer cells to grow and multiply. This helps to slow or stop the spread of cancer cells. It may also be used to treat autoimmune conditions, such as arthritis. It works by slowing down an overactive immune system. It is a monoclonal antibody. This medicine may be used for other purposes; ask your health care provider or pharmacist if you have questions. COMMON BRAND NAME(S): RIABNI, Rituxan, RUXIENCE, truxima What should I tell my  care team before I take this medication? They need to know if you have any of these conditions: Chest pain Heart disease Immune system problems Infection, such as chickenpox, cold sores, hepatitis B, herpes Irregular heartbeat or rhythm Kidney disease Low blood counts, such as low white cells, platelets, red cells Lung disease Recent  or upcoming vaccine An unusual or allergic reaction to rituximab, other medications, foods, dyes, or preservatives Pregnant or trying to get pregnant Breast-feeding How should I use this medication? This medication is injected into a vein. It is given by a care team in a hospital or clinic setting. A special MedGuide will be given to you before each treatment. Be sure to read this information carefully each time. Talk to your care team about the use of this medication in children. While this medication may be prescribed for children as young as 6 months for selected conditions, precautions do apply. Overdosage: If you think you have taken too much of this medicine contact a poison control center or emergency room at once. NOTE: This medicine is only for you. Do not share this medicine with others. What if I miss a dose? Keep appointments for follow-up doses. It is important not to miss your dose. Call your care team if you are unable to keep an appointment. What may interact with this medication? Do not take this medication with any of the following: Live vaccines This medication may also interact with the following: Cisplatin This list may not describe all possible interactions. Give your health care provider a list of all the medicines, herbs, non-prescription drugs, or dietary supplements you use. Also tell them if you smoke, drink alcohol, or use illegal drugs. Some items may interact with your medicine. What should I watch for while using this medication? Your condition will be monitored carefully while you are receiving this medication. You may need blood work while taking this medication. This medication can cause serious infusion reactions. To reduce the risk your care team may give you other medications to take before receiving this one. Be sure to follow the directions from your care team. This medication may increase your risk of getting an infection. Call your care team for advice  if you get a fever, chills, sore throat, or other symptoms of a cold or flu. Do not treat yourself. Try to avoid being around people who are sick. Call your care team if you are around anyone with measles, chickenpox, or if you develop sores or blisters that do not heal properly. Avoid taking medications that contain aspirin, acetaminophen, ibuprofen, naproxen, or ketoprofen unless instructed by your care team. These medications may hide a fever. This medication may cause serious skin reactions. They can happen weeks to months after starting the medication. Contact your care team right away if you notice fevers or flu-like symptoms with a rash. The rash may be red or purple and then turn into blisters or peeling of the skin. You may also notice a red rash with swelling of the face, lips, or lymph nodes in your neck or under your arms. In some patients, this medication may cause a serious brain infection that may cause death. If you have any problems seeing, thinking, speaking, walking, or standing, tell your care team right away. If you cannot reach your care team, urgently seek another source of medical care. Talk to your care team if you may be pregnant. Serious birth defects can occur if you take this medication during pregnancy and for 12 months after  the last dose. You will need a negative pregnancy test before starting this medication. Contraception is recommended while taking this medication and for 12 months after the last dose. Your care team can help you find the option that works for you. Do not breastfeed while taking this medication and for at least 6 months after the last dose. What side effects may I notice from receiving this medication? Side effects that you should report to your care team as soon as possible: Allergic reactions or angioedema--skin rash, itching or hives, swelling of the face, eyes, lips, tongue, arms, or legs, trouble swallowing or breathing Bowel blockage--stomach  cramping, unable to have a bowel movement or pass gas, loss of appetite, vomiting Dizziness, loss of balance or coordination, confusion or trouble speaking Heart attack--pain or tightness in the chest, shoulders, arms, or jaw, nausea, shortness of breath, cold or clammy skin, feeling faint or lightheaded Heart rhythm changes--fast or irregular heartbeat, dizziness, feeling faint or lightheaded, chest pain, trouble breathing Infection--fever, chills, cough, sore throat, wounds that don't heal, pain or trouble when passing urine, general feeling of discomfort or being unwell Infusion reactions--chest pain, shortness of breath or trouble breathing, feeling faint or lightheaded Kidney injury--decrease in the amount of urine, swelling of the ankles, hands, or feet Liver injury--right upper belly pain, loss of appetite, nausea, light-colored stool, dark yellow or brown urine, yellowing skin or eyes, unusual weakness or fatigue Redness, blistering, peeling, or loosening of the skin, including inside the mouth Stomach pain that is severe, does not go away, or gets worse Tumor lysis syndrome (TLS)--nausea, vomiting, diarrhea, decrease in the amount of urine, dark urine, unusual weakness or fatigue, confusion, muscle pain or cramps, fast or irregular heartbeat, joint pain Side effects that usually do not require medical attention (report to your care team if they continue or are bothersome): Headache Joint pain Nausea Runny or stuffy nose Unusual weakness or fatigue This list may not describe all possible side effects. Call your doctor for medical advice about side effects. You may report side effects to FDA at 1-800-FDA-1088. Where should I keep my medication? This medication is given in a hospital or clinic. It will not be stored at home. NOTE: This sheet is a summary. It may not cover all possible information. If you have questions about this medicine, talk to your doctor, pharmacist, or health care  provider.  2024 Elsevier/Gold Standard (2022-04-20 00:00:00)

## 2024-02-20 NOTE — Telephone Encounter (Signed)
 Pt instructed to Check BP and Heart rate at home. Keep a log. The following medication are to be on HOLD ; metoprolol, losartan (cozaar), amlodipine and lasix. Bring log to next appt in cancer center.

## 2024-02-20 NOTE — Progress Notes (Signed)
 Pt with lower BP (Se VS graph) throughout infusion, Dr Donneta Romberg aware throughout and instructions (See MAR- med paused)  received. Pt did take cozarr this am... Pt and wife sent home with instructions for no Bp meds or Lasix, taking BP and parameters with  when to call. Both verbalized understanding

## 2024-02-21 ENCOUNTER — Inpatient Hospital Stay

## 2024-02-21 ENCOUNTER — Telehealth: Payer: Self-pay | Admitting: Internal Medicine

## 2024-02-21 ENCOUNTER — Telehealth: Payer: Self-pay | Admitting: *Deleted

## 2024-02-21 ENCOUNTER — Telehealth: Payer: Self-pay

## 2024-02-21 NOTE — Telephone Encounter (Signed)
 When I called back I said that I can send the MD an get the answers. The husbamd says they figured out and he just took off metroprolol. He says he has it right now. No need to tell the MD

## 2024-02-21 NOTE — Telephone Encounter (Signed)
 STAT if HR is under 50 or over 120 (normal HR is 60-100 beats per minute)  What is your heart rate? 139, 141  Do you have a log of your heart rate readings (document readings)? No  Do you have any other symptoms? Had 1st chemo session yesterday and was told to hold off  amLODipine (NORVASC) 10 MG tablet ; furosemide (LASIX) 20 MG tablet ; losartan (COZAAR) 100 MG tablet ; metoprolol tartrate (LOPRESSOR) 25 MG tablet ; lightheadedness

## 2024-02-21 NOTE — Telephone Encounter (Signed)
 Dr Okey Dupre,  Mr Younge call into the STAT line (see phone note) and when I spoke to him he reported: - 1st chemo treatment yesterday, and now BP 113/62 HR as measured from BP cuff is 139 (pt reports pulse greater than 100 at times for about the last 30 days).  Pt reports that "chemo doc told to told hold off on all BP meds" pt reports 1 week off Amlodipine, furosemide, losartan, and metoprolol.  Still "taking blood thinner"  Pt reports history of stents, and now feels "very, very lightheaded ... and weak".  Pt reports good hydration, drinking at least 2 liters of water per day and urine is clear.  Pt wants to know if he can get back on metoprolol?  Pt is scheduled to see Brion Aliment on 3/27.  Please advise and thank you,  Danelle Earthly

## 2024-02-21 NOTE — Telephone Encounter (Signed)
 Given HR of 139 and sensation of being very, very lightheaded, I recommend that Mr. Fouse go to the ED for further evaluation.  He could be having a reaction to chemotherapy or be developing an infection, as his immune system is weakened in the setting of his lymphoma and ongoing chemotherapy.  Yvonne Kendall, MD [facility]

## 2024-02-21 NOTE — Telephone Encounter (Signed)
Telephone call to patient for follow up after receiving first infusion.   No answer but left message stating we were calling to check on them.  Encouraged patient to call for any questions or concerns.   

## 2024-02-21 NOTE — Telephone Encounter (Signed)
 The patient has been notified of Dr Serita Kyle recommendations. Pt verbalized understanding. All questions (if any) were answered.  Pt reports that his wife is at a doctor's appt and he'll discuss treatment options once she is home.   [facility] message sent

## 2024-02-24 NOTE — Patient Instructions (Signed)
 Be Involved in Caring For Your Health:  Taking Medications When medications are taken as directed, they can greatly improve your health. But if they are not taken as prescribed, they may not work. In some cases, not taking them correctly can be harmful. To help ensure your treatment remains effective and safe, understand your medications and how to take them. Bring your medications to each visit for review by your provider.  Your lab results, notes, and after visit summary will be available on My Chart. We strongly encourage you to use this feature. If lab results are abnormal the clinic will contact you with the appropriate steps. If the clinic does not contact you assume the results are satisfactory. You can always view your results on My Chart. If you have questions regarding your health or results, please contact the clinic during office hours. You can also ask questions on My Chart.  We at [facility] are grateful that you chose [place] to provide your care. We strive to provide evidence-based and compassionate care and are always looking for feedback. If you get a survey from the clinic please complete this so we can hear your opinions.  Heart-Healthy Eating Plan Many factors influence your heart health, including eating and exercise habits. Heart health is also called coronary health. Coronary risk increases with abnormal blood fat (lipid) levels. A heart-healthy eating plan includes limiting unhealthy fats, increasing healthy fats, limiting salt (sodium) intake, and making other diet and lifestyle changes. What is my plan? Your health care provider may recommend that: You limit your fat intake to _________% or less of your total calories each day. You limit your saturated fat intake to _________% or less of your total calories each day. You limit the amount of cholesterol in your diet to less than _________ mg per day. You limit the amount of sodium in your diet to less than _________  mg per day. What are tips for following this plan? Cooking Cook foods using methods other than frying. Baking, boiling, grilling, and broiling are all good options. Other ways to reduce fat include: Removing the skin from poultry. Removing all visible fats from meats. Steaming vegetables in water or broth. Meal planning  At meals, imagine dividing your plate into fourths: Fill one-half of your plate with vegetables and green salads. Fill one-fourth of your plate with whole grains. Fill one-fourth of your plate with lean protein foods. Eat 2-4 cups of vegetables per day. One cup of vegetables equals 1 cup (91 g) broccoli or cauliflower florets, 2 medium carrots, 1 large bell pepper, 1 large sweet potato, 1 large tomato, 1 medium white potato, 2 cups (150 g) raw leafy greens. Eat 1-2 cups of fruit per day. One cup of fruit equals 1 small apple, 1 large banana, 1 cup (237 g) mixed fruit, 1 large orange,  cup (82 g) dried fruit, 1 cup (240 mL) 100% fruit juice. Eat more foods that contain soluble fiber. Examples include apples, broccoli, carrots, beans, peas, and barley. Aim to get 25-30 g of fiber per day. Increase your consumption of legumes, nuts, and seeds to 4-5 servings per week. One serving of dried beans or legumes equals  cup (90 g) cooked, 1 serving of nuts is  oz (12 almonds, 24 pistachios, or 7 walnut halves), and 1 serving of seeds equals  oz (8 g). Fats Choose healthy fats more often. Choose monounsaturated and polyunsaturated fats, such as olive and canola oils, avocado oil, flaxseeds, walnuts, almonds, and seeds. Eat  more omega-3 fats. Choose salmon, mackerel, sardines, tuna, flaxseed oil, and ground flaxseeds. Aim to eat fish at least 2 times each week. Check food labels carefully to identify foods with trans fats or high amounts of saturated fat. Limit saturated fats. These are found in animal products, such as meats, butter, and cream. Plant sources of saturated fats  include palm oil, palm kernel oil, and coconut oil. Avoid foods with partially hydrogenated oils in them. These contain trans fats. Examples are stick margarine, some tub margarines, cookies, crackers, and other baked goods. Avoid fried foods. General information Eat more home-cooked food and less restaurant, buffet, and fast food. Limit or avoid alcohol. Limit foods that are high in added sugar and simple starches such as foods made using white refined flour (white breads, pastries, sweets). Lose weight if you are overweight. Losing just 5-10% of your body weight can help your overall health and prevent diseases such as diabetes and heart disease. Monitor your sodium intake, especially if you have high blood pressure. Talk with your health care provider about your sodium intake. Try to incorporate more vegetarian meals weekly. What foods should I eat? Fruits All fresh, canned (in natural juice), or frozen fruits. Vegetables Fresh or frozen vegetables (raw, steamed, roasted, or grilled). Green salads. Grains Most grains. Choose whole wheat and whole grains most of the time. Rice and pasta, including brown rice and pastas made with whole wheat. Meats and other proteins Lean, well-trimmed beef, veal, pork, and lamb. Chicken and [place] without skin. All fish and shellfish. Wild duck, rabbit, pheasant, and venison. Egg whites or low-cholesterol egg substitutes. Dried beans, peas, lentils, and tofu. Seeds and most nuts. Dairy Low-fat or nonfat cheeses, including ricotta and mozzarella. Skim or 1% milk (liquid, powdered, or evaporated). Buttermilk made with low-fat milk. Nonfat or low-fat yogurt. Fats and oils Non-hydrogenated (trans-free) margarines. Vegetable oils, including soybean, sesame, sunflower, olive, avocado, peanut, safflower, corn, canola, and cottonseed. Salad dressings or mayonnaise made with a vegetable oil. Beverages Water (mineral or sparkling). Coffee and tea. Unsweetened ice  tea. Diet beverages. Sweets and desserts Sherbet, gelatin, and fruit ice. Small amounts of dark chocolate. Limit all sweets and desserts. Seasonings and condiments All seasonings and condiments. The items listed above may not be a complete list of foods and beverages you can eat. Contact a dietitian for more options. What foods should I avoid? Fruits Canned fruit in heavy syrup. Fruit in cream or butter sauce. Fried fruit. Limit coconut. Vegetables Vegetables cooked in cheese, cream, or butter sauce. Fried vegetables. Grains Breads made with saturated or trans fats, oils, or whole milk. Croissants. Sweet rolls. Donuts. High-fat crackers, such as cheese crackers and chips. Meats and other proteins Fatty meats, such as hot dogs, ribs, sausage, bacon, rib-eye roast or steak. High-fat deli meats, such as salami and bologna. Caviar. Domestic duck and goose. Organ meats, such as liver. Dairy Cream, sour cream, cream cheese, and creamed cottage cheese. Whole-milk cheeses. Whole or 2% milk (liquid, evaporated, or condensed). Whole buttermilk. Cream sauce or high-fat cheese sauce. Whole-milk yogurt. Fats and oils Meat fat, or shortening. Cocoa butter, hydrogenated oils, palm oil, coconut oil, palm kernel oil. Solid fats and shortenings, including bacon fat, salt pork, lard, and butter. Nondairy cream substitutes. Salad dressings with cheese or sour cream. Beverages Regular sodas and any drinks with added sugar. Sweets and desserts Frosting. Pudding. Cookies. Cakes. Pies. Milk chocolate or white chocolate. Buttered syrups. Full-fat ice cream or ice cream drinks. The items listed above may  not be a complete list of foods and beverages to avoid. Contact a dietitian for more information. Summary Heart-healthy meal planning includes limiting unhealthy fats, increasing healthy fats, limiting salt (sodium) intake and making other diet and lifestyle changes. Lose weight if you are overweight. Losing just  5-10% of your body weight can help your overall health and prevent diseases such as diabetes and heart disease. Focus on eating a balance of foods, including fruits and vegetables, low-fat or nonfat dairy, lean protein, nuts and legumes, whole grains, and heart-healthy oils and fats. This information is not intended to replace advice given to you by your health care provider. Make sure you discuss any questions you have with your health care provider. Document Revised: 01/02/2022 Document Reviewed: 01/02/2022 Elsevier Patient Education  2024 [employer].

## 2024-02-26 ENCOUNTER — Emergency Department

## 2024-02-26 ENCOUNTER — Observation Stay

## 2024-02-26 ENCOUNTER — Encounter: Payer: Self-pay | Admitting: Nurse Practitioner

## 2024-02-26 ENCOUNTER — Other Ambulatory Visit: Payer: Self-pay

## 2024-02-26 ENCOUNTER — Inpatient Hospital Stay
Admission: EM | Admit: 2024-02-26 | Discharge: 2024-02-29 | DRG: 309 | Disposition: A | Discharge status: Home / Self Care | Source: Ambulatory Visit | Attending: Internal Medicine | Admitting: Internal Medicine

## 2024-02-26 ENCOUNTER — Ambulatory Visit (INDEPENDENT_AMBULATORY_CARE_PROVIDER_SITE_OTHER): Payer: Medicare Other | Admitting: Nurse Practitioner

## 2024-02-26 ENCOUNTER — Telehealth: Payer: Self-pay | Admitting: Internal Medicine

## 2024-02-26 ENCOUNTER — Encounter: Payer: Self-pay | Admitting: Internal Medicine

## 2024-02-26 VITALS — BP 104/70 | HR 148 | Temp 97.8°F | Ht 70.5 in | Wt 234.6 lb

## 2024-02-26 DIAGNOSIS — M549 Dorsalgia, unspecified: Secondary | ICD-10-CM | POA: Insufficient documentation

## 2024-02-26 DIAGNOSIS — J209 Acute bronchitis, unspecified: Secondary | ICD-10-CM | POA: Diagnosis present

## 2024-02-26 DIAGNOSIS — Z9081 Acquired absence of spleen: Secondary | ICD-10-CM | POA: Diagnosis not present

## 2024-02-26 DIAGNOSIS — I4891 Unspecified atrial fibrillation: Principal | ICD-10-CM | POA: Diagnosis present

## 2024-02-26 DIAGNOSIS — Z8249 Family history of ischemic heart disease and other diseases of the circulatory system: Secondary | ICD-10-CM

## 2024-02-26 DIAGNOSIS — Z8572 Personal history of non-Hodgkin lymphomas: Secondary | ICD-10-CM | POA: Diagnosis not present

## 2024-02-26 DIAGNOSIS — I2510 Atherosclerotic heart disease of native coronary artery without angina pectoris: Secondary | ICD-10-CM | POA: Diagnosis present

## 2024-02-26 DIAGNOSIS — R7989 Other specified abnormal findings of blood chemistry: Secondary | ICD-10-CM | POA: Diagnosis not present

## 2024-02-26 DIAGNOSIS — R0602 Shortness of breath: Secondary | ICD-10-CM | POA: Diagnosis present

## 2024-02-26 DIAGNOSIS — M48061 Spinal stenosis, lumbar region without neurogenic claudication: Secondary | ICD-10-CM | POA: Diagnosis not present

## 2024-02-26 DIAGNOSIS — I5032 Chronic diastolic (congestive) heart failure: Secondary | ICD-10-CM | POA: Diagnosis present

## 2024-02-26 DIAGNOSIS — R918 Other nonspecific abnormal finding of lung field: Secondary | ICD-10-CM | POA: Diagnosis not present

## 2024-02-26 DIAGNOSIS — J168 Pneumonia due to other specified infectious organisms: Secondary | ICD-10-CM | POA: Diagnosis not present

## 2024-02-26 DIAGNOSIS — F419 Anxiety disorder, unspecified: Secondary | ICD-10-CM

## 2024-02-26 DIAGNOSIS — I110 Hypertensive heart disease with heart failure: Secondary | ICD-10-CM | POA: Diagnosis present

## 2024-02-26 DIAGNOSIS — M1A0720 Idiopathic chronic gout, left ankle and foot, without tophus (tophi): Secondary | ICD-10-CM

## 2024-02-26 DIAGNOSIS — Z8711 Personal history of peptic ulcer disease: Secondary | ICD-10-CM

## 2024-02-26 DIAGNOSIS — R7303 Prediabetes: Secondary | ICD-10-CM

## 2024-02-26 DIAGNOSIS — Z6833 Body mass index (BMI) 33.0-33.9, adult: Secondary | ICD-10-CM

## 2024-02-26 DIAGNOSIS — C884 Extranodal marginal zone b-cell lymphoma of mucosa-associated lymphoid tissue (malt-lymphoma) not having achieved remission: Secondary | ICD-10-CM | POA: Diagnosis present

## 2024-02-26 DIAGNOSIS — Z800 Family history of malignant neoplasm of digestive organs: Secondary | ICD-10-CM

## 2024-02-26 DIAGNOSIS — I480 Paroxysmal atrial fibrillation: Principal | ICD-10-CM | POA: Diagnosis present

## 2024-02-26 DIAGNOSIS — I2489 Other forms of acute ischemic heart disease: Secondary | ICD-10-CM | POA: Diagnosis present

## 2024-02-26 DIAGNOSIS — D692 Other nonthrombocytopenic purpura: Secondary | ICD-10-CM

## 2024-02-26 DIAGNOSIS — I471 Supraventricular tachycardia, unspecified: Secondary | ICD-10-CM

## 2024-02-26 DIAGNOSIS — R0989 Other specified symptoms and signs involving the circulatory and respiratory systems: Secondary | ICD-10-CM | POA: Diagnosis not present

## 2024-02-26 DIAGNOSIS — R590 Localized enlarged lymph nodes: Secondary | ICD-10-CM | POA: Diagnosis not present

## 2024-02-26 DIAGNOSIS — Z83438 Family history of other disorder of lipoprotein metabolism and other lipidemia: Secondary | ICD-10-CM

## 2024-02-26 DIAGNOSIS — Z955 Presence of coronary angioplasty implant and graft: Secondary | ICD-10-CM | POA: Diagnosis not present

## 2024-02-26 DIAGNOSIS — G8929 Other chronic pain: Secondary | ICD-10-CM | POA: Diagnosis present

## 2024-02-26 DIAGNOSIS — E785 Hyperlipidemia, unspecified: Secondary | ICD-10-CM | POA: Diagnosis present

## 2024-02-26 DIAGNOSIS — Z79899 Other long term (current) drug therapy: Secondary | ICD-10-CM | POA: Diagnosis not present

## 2024-02-26 DIAGNOSIS — J189 Pneumonia, unspecified organism: Secondary | ICD-10-CM | POA: Insufficient documentation

## 2024-02-26 DIAGNOSIS — F339 Major depressive disorder, recurrent, unspecified: Secondary | ICD-10-CM | POA: Diagnosis not present

## 2024-02-26 DIAGNOSIS — M51379 Other intervertebral disc degeneration, lumbosacral region without mention of lumbar back pain or lower extremity pain: Secondary | ICD-10-CM | POA: Diagnosis not present

## 2024-02-26 DIAGNOSIS — J439 Emphysema, unspecified: Secondary | ICD-10-CM | POA: Diagnosis present

## 2024-02-26 DIAGNOSIS — Z7902 Long term (current) use of antithrombotics/antiplatelets: Secondary | ICD-10-CM

## 2024-02-26 DIAGNOSIS — N400 Enlarged prostate without lower urinary tract symptoms: Secondary | ICD-10-CM | POA: Diagnosis present

## 2024-02-26 DIAGNOSIS — I10 Essential (primary) hypertension: Secondary | ICD-10-CM | POA: Diagnosis present

## 2024-02-26 DIAGNOSIS — K219 Gastro-esophageal reflux disease without esophagitis: Secondary | ICD-10-CM | POA: Diagnosis present

## 2024-02-26 DIAGNOSIS — K2270 Barrett's esophagus without dysplasia: Secondary | ICD-10-CM | POA: Diagnosis not present

## 2024-02-26 DIAGNOSIS — R051 Acute cough: Secondary | ICD-10-CM | POA: Diagnosis present

## 2024-02-26 DIAGNOSIS — C8330 Diffuse large B-cell lymphoma, unspecified site: Secondary | ICD-10-CM | POA: Diagnosis present

## 2024-02-26 DIAGNOSIS — R9389 Abnormal findings on diagnostic imaging of other specified body structures: Secondary | ICD-10-CM | POA: Diagnosis not present

## 2024-02-26 DIAGNOSIS — I25118 Atherosclerotic heart disease of native coronary artery with other forms of angina pectoris: Secondary | ICD-10-CM | POA: Diagnosis not present

## 2024-02-26 DIAGNOSIS — E669 Obesity, unspecified: Secondary | ICD-10-CM | POA: Diagnosis present

## 2024-02-26 DIAGNOSIS — I4719 Other supraventricular tachycardia: Secondary | ICD-10-CM | POA: Diagnosis not present

## 2024-02-26 DIAGNOSIS — E66811 Obesity, class 1: Secondary | ICD-10-CM | POA: Diagnosis not present

## 2024-02-26 DIAGNOSIS — C8333 Diffuse large B-cell lymphoma, intra-abdominal lymph nodes: Secondary | ICD-10-CM | POA: Diagnosis not present

## 2024-02-26 DIAGNOSIS — M51369 Other intervertebral disc degeneration, lumbar region without mention of lumbar back pain or lower extremity pain: Secondary | ICD-10-CM | POA: Diagnosis not present

## 2024-02-26 DIAGNOSIS — M7989 Other specified soft tissue disorders: Secondary | ICD-10-CM | POA: Diagnosis not present

## 2024-02-26 DIAGNOSIS — E6609 Other obesity due to excess calories: Secondary | ICD-10-CM

## 2024-02-26 LAB — BASIC METABOLIC PANEL
Anion gap: 12 (ref 5–15)
BUN: 26 mg/dL — ABNORMAL HIGH (ref 8–23)
CO2: 23 mmol/L (ref 22–32)
Calcium: 9 mg/dL (ref 8.9–10.3)
Chloride: 96 mmol/L — ABNORMAL LOW (ref 98–111)
Creatinine, Ser: 1.15 mg/dL (ref 0.61–1.24)
GFR, Estimated: >60 mL/min (ref >60)
Glucose, Bld: 127 mg/dL — ABNORMAL HIGH (ref 70–99)
Potassium: 4.3 mmol/L (ref 3.5–5.1)
Sodium: 131 mmol/L — ABNORMAL LOW (ref 135–145)

## 2024-02-26 LAB — URINALYSIS, W/ REFLEX TO CULTURE (INFECTION SUSPECTED)
Bacteria, UA: NONE SEEN
Bilirubin Urine: NEGATIVE
Glucose, UA: NEGATIVE mg/dL
Hgb urine dipstick: NEGATIVE
Ketones, ur: NEGATIVE mg/dL
Leukocytes,Ua: NEGATIVE
Nitrite: NEGATIVE
Protein, ur: NEGATIVE mg/dL
Specific Gravity, Urine: >1.046 — ABNORMAL HIGH (ref 1.005–1.030)
Squamous Epithelial / HPF: 0 /HPF (ref 0–5)
pH: 5 (ref 5.0–8.0)

## 2024-02-26 LAB — CBC
HCT: 29.4 % — ABNORMAL LOW (ref 39.0–52.0)
Hemoglobin: 10 g/dL — ABNORMAL LOW (ref 13.0–17.0)
MCH: 35.6 pg — ABNORMAL HIGH (ref 26.0–34.0)
MCHC: 34 g/dL (ref 30.0–36.0)
MCV: 104.6 fL — ABNORMAL HIGH (ref 80.0–100.0)
Platelets: 314 10*3/uL (ref 150–400)
RBC: 2.81 MIL/uL — ABNORMAL LOW (ref 4.22–5.81)
RDW: 14.5 % (ref 11.5–15.5)
WBC: 7.8 10*3/uL (ref 4.0–10.5)
nRBC: 1.1 % — ABNORMAL HIGH (ref 0.0–0.2)

## 2024-02-26 LAB — RESP PANEL BY RT-PCR (RSV, FLU A&B, COVID)  RVPGX2
Influenza A by PCR: NEGATIVE
Influenza B by PCR: NEGATIVE
Resp Syncytial Virus by PCR: NEGATIVE
SARS Coronavirus 2 by RT PCR: NEGATIVE

## 2024-02-26 LAB — BRAIN NATRIURETIC PEPTIDE: B Natriuretic Peptide: 49.9 pg/mL (ref 0.0–100.0)

## 2024-02-26 LAB — PROTIME-INR
INR: 1 (ref 0.8–1.2)
Prothrombin Time: 13.9 s (ref 11.4–15.2)

## 2024-02-26 LAB — TROPONIN I (HIGH SENSITIVITY): Troponin I (High Sensitivity): 19 ng/L — ABNORMAL HIGH (ref <18)

## 2024-02-26 MED ORDER — SODIUM CHLORIDE 0.9 % IV SOLN
100.0000 mg | Freq: Once | INTRAVENOUS | Status: DC
  Filled 2024-02-26: qty 100

## 2024-02-26 MED ORDER — ONDANSETRON HCL 4 MG PO TABS: 4.0000 mg | ORAL_TABLET | Freq: Four times a day (QID) | ORAL | Status: DC | PRN

## 2024-02-26 MED ORDER — SODIUM CHLORIDE 0.9 % IV SOLN: INTRAVENOUS | Status: AC

## 2024-02-26 MED ORDER — ALLOPURINOL 100 MG PO TABS
300.0000 mg | ORAL_TABLET | Freq: Two times a day (BID) | ORAL | Status: DC
  Administered 2024-02-27 – 2024-02-29 (×5): 300 mg via ORAL
  Filled 2024-02-26: qty 1
  Filled 2024-02-26 (×2): qty 3
  Filled 2024-02-26 (×2): qty 1

## 2024-02-26 MED ORDER — METOPROLOL TARTRATE 5 MG/5ML IV SOLN
5.0000 mg | INTRAVENOUS | Status: DC | PRN
  Filled 2024-02-26: qty 5

## 2024-02-26 MED ORDER — LIDOCAINE 5 % EX PTCH: 1.0000 | MEDICATED_PATCH | CUTANEOUS | 3 refills | Status: DC

## 2024-02-26 MED ORDER — SODIUM CHLORIDE 0.9 % IV SOLN
2.0000 g | INTRAVENOUS | Status: DC
  Administered 2024-02-26 – 2024-02-28 (×3): 2 g via INTRAVENOUS
  Filled 2024-02-26 (×3): qty 20

## 2024-02-26 MED ORDER — LACTATED RINGERS IV BOLUS
500.0000 mL | Freq: Once | INTRAVENOUS | Status: AC
  Administered 2024-02-26: 500 mL via INTRAVENOUS

## 2024-02-26 MED ORDER — MORPHINE SULFATE (PF) 2 MG/ML IV SOLN
2.0000 mg | INTRAVENOUS | Status: DC | PRN
  Administered 2024-02-27: 2 mg via INTRAVENOUS
  Filled 2024-02-26: qty 1

## 2024-02-26 MED ORDER — HEPARIN SODIUM (PORCINE) 5000 UNIT/ML IJ SOLN
5000.0000 [IU] | Freq: Three times a day (TID) | INTRAMUSCULAR | Status: DC
  Administered 2024-02-26 – 2024-02-27 (×3): 5000 [IU] via SUBCUTANEOUS
  Filled 2024-02-26 (×3): qty 1

## 2024-02-26 MED ORDER — ATORVASTATIN CALCIUM 20 MG PO TABS
40.0000 mg | ORAL_TABLET | Freq: Every day | ORAL | Status: DC
  Administered 2024-02-27 – 2024-02-29 (×3): 40 mg via ORAL
  Filled 2024-02-26 (×3): qty 2

## 2024-02-26 MED ORDER — HYDRALAZINE HCL 20 MG/ML IJ SOLN: 5.0000 mg | Freq: Four times a day (QID) | INTRAMUSCULAR | Status: DC | PRN

## 2024-02-26 MED ORDER — FLUOXETINE HCL 10 MG PO TABS: 10.0000 mg | ORAL_TABLET | Freq: Every day | ORAL | 3 refills | Status: DC

## 2024-02-26 MED ORDER — HYDROCODONE-ACETAMINOPHEN 5-325 MG PO TABS
1.0000 | ORAL_TABLET | Freq: Four times a day (QID) | ORAL | Status: DC | PRN
  Administered 2024-02-28: 1 via ORAL
  Filled 2024-02-26: qty 1

## 2024-02-26 MED ORDER — LIDOCAINE 5 % EX PTCH
1.0000 | MEDICATED_PATCH | CUTANEOUS | Status: DC
  Administered 2024-02-27: 2 via TRANSDERMAL
  Administered 2024-02-27 (×2): 1 via TRANSDERMAL
  Administered 2024-02-28: 2 via TRANSDERMAL
  Filled 2024-02-26 (×4): qty 2
  Filled 2024-02-26: qty 1

## 2024-02-26 MED ORDER — HYDROCODONE-ACETAMINOPHEN 5-325 MG PO TABS: 1.0000 | ORAL_TABLET | Freq: Four times a day (QID) | ORAL | 0 refills | Status: AC | PRN

## 2024-02-26 MED ORDER — ACETAMINOPHEN 325 MG PO TABS
650.0000 mg | ORAL_TABLET | Freq: Four times a day (QID) | ORAL | Status: DC | PRN
  Administered 2024-02-27: 650 mg via ORAL
  Filled 2024-02-26: qty 2

## 2024-02-26 MED ORDER — SODIUM CHLORIDE 0.9 % IV SOLN: 1.0000 g | Freq: Once | INTRAVENOUS | Status: DC

## 2024-02-26 MED ORDER — ACETAMINOPHEN 650 MG RE SUPP: 650.0000 mg | Freq: Four times a day (QID) | RECTAL | Status: DC | PRN

## 2024-02-26 MED ORDER — CLOPIDOGREL BISULFATE 75 MG PO TABS
75.0000 mg | ORAL_TABLET | Freq: Every day | ORAL | Status: DC
  Filled 2024-02-26: qty 1

## 2024-02-26 MED ORDER — ACYCLOVIR 200 MG PO CAPS
400.0000 mg | ORAL_CAPSULE | Freq: Two times a day (BID) | ORAL | Status: DC
  Administered 2024-02-27 – 2024-02-29 (×5): 400 mg via ORAL
  Filled 2024-02-26 (×5): qty 2

## 2024-02-26 MED ORDER — SENNOSIDES-DOCUSATE SODIUM 8.6-50 MG PO TABS: 1.0000 | ORAL_TABLET | Freq: Every evening | ORAL | Status: DC | PRN

## 2024-02-26 MED ORDER — IOHEXOL 350 MG/ML SOLN
75.0000 mL | Freq: Once | INTRAVENOUS | Status: AC | PRN
  Administered 2024-02-26: 75 mL via INTRAVENOUS

## 2024-02-26 MED ORDER — NITROGLYCERIN 0.4 MG SL SUBL: 0.4000 mg | SUBLINGUAL_TABLET | SUBLINGUAL | Status: DC | PRN

## 2024-02-26 MED ORDER — DILTIAZEM HCL 25 MG/5ML IV SOLN
15.0000 mg | Freq: Once | INTRAVENOUS | Status: AC
  Administered 2024-02-26: 15 mg via INTRAVENOUS
  Filled 2024-02-26: qty 5

## 2024-02-26 MED ORDER — SODIUM CHLORIDE 0.9 % IV SOLN
500.0000 mg | INTRAVENOUS | Status: DC
  Administered 2024-02-26 – 2024-02-28 (×3): 500 mg via INTRAVENOUS
  Filled 2024-02-26 (×3): qty 5

## 2024-02-26 MED ORDER — ONDANSETRON HCL 4 MG/2ML IJ SOLN
4.0000 mg | Freq: Four times a day (QID) | INTRAMUSCULAR | Status: DC | PRN
  Administered 2024-02-29: 4 mg via INTRAVENOUS
  Filled 2024-02-26: qty 2

## 2024-02-26 NOTE — Assessment & Plan Note (Signed)
 Refer to depression plan of care.

## 2024-02-26 NOTE — Assessment & Plan Note (Signed)
Chronic, ongoing.  Recommend gentle skin care at home and monitor for abrasions or skin breakdown, alert provider if present.

## 2024-02-26 NOTE — ED Triage Notes (Signed)
 Pt was at PCP today for a wellness check. While there pt was in Afib rvr and was advised to come to ER. Pt states has felt SOB and no energy. Pt had first chemo treatment last week.

## 2024-02-26 NOTE — Telephone Encounter (Signed)
 Discussed with pt PCP- Hr in 160-recommend to go to ER.  Also discussed with path- currently pending- FISH/clonal studies.    Just FYI

## 2024-02-26 NOTE — Assessment & Plan Note (Signed)
 BMI 33.19 with underlying cardiac issues and prediabetes.  Educated on this medication at length.  Recommended eating smaller high protein, low fat meals more frequently and exercising 30 mins a day 5 times a week with a goal of 10-15lb weight loss in the next 3 months. Patient voiced their understanding and motivation to adhere to these recommendations.

## 2024-02-26 NOTE — Hospital Course (Signed)
 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  Vitals in the ED showed temperature 98.3, respiration rate 16, heart rate initially 160 and improved to 105, blood pressure 104/70, improved to 114/75,, SpO2 97% on room air currently 100% on room air.  Serum sodium is 131, potassium 4.3, chloride 96, bicarb 23, BUN of 26, serum creatinine 1.15, EGFR greater than 60, nonfasting blood glucose 127, WBC 7.8, hemoglobin 10.0, platelets of 314.  BNP is 49.9.  High sensitive troponin is 19.  ED treatment: LR 500 mL liter bolus, ceftriaxone 1 g IV, doxycycline 100 mg IV one-time dose.

## 2024-02-26 NOTE — Assessment & Plan Note (Signed)
 Azithromycin 500 mg IV daily, 5 days ordered; ceftriaxone 2 g IV daily, 5 days ordered Flutter valve, incentive spirometry

## 2024-02-26 NOTE — Assessment & Plan Note (Signed)
 Chronic with BP well below goal.  Off all BP medications per oncology. Recommend he continue to monitor BP at home and document + focus on DASH diet and regular exercise.  At this time recommend ER visit due to elevation in HR, concern for a fib.  Is off BB.  Sent secure chat to Dr. Donneta Romberg who also recommends ER.

## 2024-02-26 NOTE — Assessment & Plan Note (Signed)
 Ongoing.  6% in September.  Will reach out to oncology to see if they can obtain A1c and TSH on upcoming labs, is on Prednisone.  Unable to draw labs today, no return on attempt.

## 2024-02-26 NOTE — Assessment & Plan Note (Signed)
 Suspect the pain is musculoskeletal as it is reproducible on palpation however given patient's history of lymphoma, lumbar x-ray ordered on admission Lidocaine patch, 1-2 patch every 24 hours ordered on admission for 5 days Morphine 2 mg IV every 4 hours as needed for severe pain, 20 hours of coverage ordered AM team to reevaluate patient at bedside for continued IV pain medication requirement

## 2024-02-26 NOTE — Assessment & Plan Note (Signed)
 Current treatments due to positive biopsy. Spoke to Dr. Donneta Romberg via secure chat.  Discussed with patient would like him to go to ER for evaluation of elevation in HR, concern for a fib.  Has been off all BP medications for weeks, including Metoprolol, due to low readings and treatments.  Continue collaboration with oncology and may benefit palliative team involvement for pain management.  Sent in Norco for lower back pain + Lidocaine patches, may help with sleep and pain.

## 2024-02-26 NOTE — Assessment & Plan Note (Signed)
 Suspect demand ischemia in think of left lower lobe pneumonia as patient denies chest pain or shortness of breath Second high sensitive troponin is pending collection at this time

## 2024-02-26 NOTE — Assessment & Plan Note (Deleted)
 Current treatments due to positive biopsy. Spoke to Dr. Donneta Romberg via secure chat.  Discussed with patient would like him to go to ER for evaluation of elevation in HR, concern for a fib.  Has been off all BP medications for weeks, including Metoprolol, due to low readings and treatments.  Continue collaboration with oncology and may benefit palliative team involvement for pain management.  Sent in Norco for lower back pain + Lidocaine patches, may help with sleep and pain.

## 2024-02-26 NOTE — H&P (Addendum)
 History and Physical   Jesse Norton WNU:[id number] DOB: September 06, 1946 DOA: 02/26/2024  PCP: Marjie Skiff, NP  Outpatient Specialists: Dr. Okey Dupre, [facility] Cardiology Patient coming from: Oncology clinic   I have personally briefly reviewed patient's old medical records in [facility] EMR.  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  Vitals in the ED showed temperature 98.3, respiration rate 16, heart rate initially 160 and improved to 105, blood pressure 104/70, improved to 114/75,, SpO2 97% on room air currently 100% on room air.  Serum sodium is 131, potassium 4.3, chloride 96, bicarb 23, BUN of 26, serum creatinine 1.15, EGFR greater than 60, nonfasting blood glucose 127, WBC 7.8, hemoglobin 10.0, platelets of 314.  BNP is 49.9.  High sensitive troponin is 19.  ED treatment: LR 500 mL liter bolus, ceftriaxone 1 g IV, doxycycline 100 mg IV one-time dose. ------------------------------- At bedside, patinet is able to tell me his name, age, current location, current calendar year.   He reports he has been having back pain for 1.5 months.  He reports he has told his outpatient provider however nothing has been done.  He reports the back pain is improved with forward flexion.  He denies trauma to his person.  He reports coughing does not make the back pain worse.  He reports he has been coughing for several days and denies known sick contacts.  He denies shortness of breath, chest pain, dysuria, hematuria, nausea, vomiting, syncope.  At bedside, the back pain is reproducible with palpation.  He reports the cough is nonproductive  Social history: He lives at home with his wife. He denies tobacco, etoh, recreational drug use. He is retired and formerly was a high  school history Runner, broadcasting/film/video.   ROS: Constitutional: no weight change, no fever ENT/Mouth: no sore throat, no rhinorrhea Eyes: no eye pain, no vision changes Cardiovascular: no chest pain, no dyspnea,  no edema, no palpitations Respiratory: + cough, no sputum, no wheezing Gastrointestinal: no nausea, no vomiting, no diarrhea, no constipation Genitourinary: no urinary incontinence, no dysuria, no hematuria Musculoskeletal: no arthralgias, + myalgias ( low back pain) Skin: no skin lesions, no pruritus, Neuro: + weakness, no loss of consciousness, no syncope Psych: no anxiety, no depression, no decrease appetite Heme/Lymph: no bruising, no bleeding  ED Course: Discussed with EDP, patient requiring hospitalization for chief concerns of left lower lobe pneumonia.  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)   Hyperlipidemia LDL goal <70   Obesity   Chronic heart failure with preserved ejection fraction (HCC)   Prediabetes   DLBCL (diffuse large B cell lymphoma) (HCC)   Left lower lobe pneumonia   Musculoskeletal back pain   Elevated troponin   Assessment and Plan:  * Atrial fibrillation with RVR (HCC) Etiology workup in progress Admit to telemetry cardiac Patient heart rate improved with diltiazem 15 mg IV one-time dose per EDP Metoprolol 5 mg IV every 4 hours as needed for heart rate greater than 115, 3 doses ordered  Elevated troponin Suspect demand ischemia in think of left lower lobe pneumonia as patient denies chest pain or shortness of  breath Second high sensitive troponin is pending collection at this time  Musculoskeletal back pain Suspect the pain is musculoskeletal as it is reproducible on palpation however given patient's history of lymphoma, lumbar x-ray  ordered on admission Lidocaine patch, 1-2 patch every 24 hours ordered on admission for 5 days Morphine 2 mg IV every 4 hours as needed for severe pain, 20 hours of coverage ordered AM team to reevaluate patient at bedside for continued IV pain medication requirement  Left lower lobe pneumonia Azithromycin 500 mg IV daily, 5 days ordered; ceftriaxone 2 g IV daily, 5 days ordered Flutter valve, incentive spirometry  Chronic heart failure with preserved ejection fraction (HCC) Does not appear to be in acute exacerbation  Essential hypertension Hydralazine 5 mg IV every 6 hours as needed for sbp > 165  Chart reviewed.   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   a.) TTE 07/16/2023: 38 mm   Barrett's esophagus    BPH (benign prostatic hyperplasia)    CAD (coronary artery disease) 10/2018   a.) ETT 11/06/2018: 1mm horizontal inflat ST depression, freq PVC's -> intermediate risk; b.) cCTA 12/23/2018: Ca2+ 2408 (93rd %'ile); FFR 0.6 mLAD, 0.87 mLCx, 0.77 dLCx --> cath recommended; c.) LHC/PCI 01/01/2019:  40% pLAD, 85% mLAD, 70% D2, 70% pLCx (2.75 x 15 mm Resolute Onyx DES), RCA min irregs -- staged PCI; d.) staged PCI 01/21/2019: orbital athrectomy + 3.0 x 15 mm Resolute Onyx DES mLAD   Chronic heart failure with preserved ejection fraction (HCC)    Colon polyps    Duodenal adenoma    Gastritis    GERD (gastroesophageal reflux disease)    Gout     H. pylori infection    H/O urethral stricture    Hemorrhoids    Hiatal hernia    History of bilateral cataract extraction 2023   History of splenectomy 1957   Hypertension    Mucosa-associated lymphoid tissue (MALT) lymphoma    Pre-diabetes    Pulmonary emphysema (HCC)    Stomach ulcer    Past Surgical History:  Procedure Laterality Date   CATARACT EXTRACTION W/PHACO Left 03/07/2022   Procedure: CATARACT EXTRACTION PHACO AND INTRAOCULAR LENS PLACEMENT (IOC) LEFT VIVITY TORIC LENS 10.41 00:55.7;  Surgeon: Galen Manila, MD;  Location: [facility];  Service: Ophthalmology;  Laterality: Left;   CATARACT EXTRACTION W/PHACO Right 03/28/2022   Procedure: CATARACT EXTRACTION PHACO AND INTRAOCULAR LENS PLACEMENT (IOC) RIGHT VIVITY LENS 6.68 00:50.0;  Surgeon: Galen Manila, MD;  Location: [facility];  Service: Ophthalmology;  Laterality: Right;   COLONOSCOPY     COLONOSCOPY N/A 06/19/2022   Procedure: COLONOSCOPY;  Surgeon: Jaynie Collins, DO;  Location: [facility] ENDOSCOPY;  Service: Gastroenterology;  Laterality: N/A;   COLONOSCOPY WITH PROPOFOL N/A 08/20/2018   Procedure: COLONOSCOPY WITH PROPOFOL;  Surgeon: Christena Deem, MD;  Location: [facility] ENDOSCOPY;  Service: Endoscopy;  Laterality: N/A;   COLONOSCOPY WITH PROPOFOL N/A 12/19/2018   Procedure: COLONOSCOPY WITH PROPOFOL;  Surgeon: Christena Deem, MD;  Location: [facility] ENDOSCOPY;  Service: Endoscopy;  Laterality: N/A;   CORONARY ATHERECTOMY N/A 02/03/2019   Procedure: CORONARY ATHERECTOMY;  Surgeon: Yvonne Kendall, MD;  Location: [facility] INVASIVE CV LAB;  Service: Cardiovascular;  Laterality: N/A;   CORONARY STENT INTERVENTION N/A 01/21/2019   Procedure: CORONARY STENT INTERVENTION;  Surgeon: Yvonne Kendall, MD;  Location: [facility] INVASIVE CV LAB;  Service: Cardiovascular;  Laterality: N/A;   CORONARY STENT INTERVENTION N/A 02/03/2019   Procedure: CORONARY STENT INTERVENTION;  Surgeon: Yvonne Kendall, MD;   Location: [facility] INVASIVE CV LAB;  Service: Cardiovascular;  Laterality: N/A;   CORONARY ULTRASOUND/IVUS N/A 02/03/2019   Procedure: Intravascular Ultrasound/IVUS;  Surgeon: Yvonne Kendall, MD;  Location: [facility]VASIVE CV LAB;  Service: Cardiovascular;  Laterality: N/A;   CYSTOSCOPY WITH URETHRAL DILATATION N/A 08/16/2018   Procedure: CYSTOSCOPY WITH URETHRAL DILATATION;  Surgeon: Sondra Come, MD;  Location: [facility] ORS;  Service: Urology;  Laterality: N/A;   ESOPHAGOGASTRODUODENOSCOPY N/A 01/29/2017   Procedure: ESOPHAGOGASTRODUODENOSCOPY (EGD);  Surgeon: Christena Deem, MD;  Location: [facility] ENDOSCOPY;  Service: Endoscopy;  Laterality: N/A;   ESOPHAGOGASTRODUODENOSCOPY N/A 06/19/2022   Procedure: ESOPHAGOGASTRODUODENOSCOPY (EGD);  Surgeon: Jaynie Collins, DO;  Location: [facility] ENDOSCOPY;  Service: Gastroenterology;  Laterality: N/A;   ESOPHAGOGASTRODUODENOSCOPY (EGD) WITH PROPOFOL N/A 07/10/2016   Procedure: ESOPHAGOGASTRODUODENOSCOPY (EGD) WITH PROPOFOL;  Surgeon: Christena Deem, MD;  Location: [facility] ENDOSCOPY;  Service: Endoscopy;  Laterality: N/A;   ESOPHAGOGASTRODUODENOSCOPY (EGD) WITH PROPOFOL N/A 12/19/2018   Procedure: ESOPHAGOGASTRODUODENOSCOPY (EGD) WITH PROPOFOL;  Surgeon: Christena Deem, MD;  Location: [facility] ENDOSCOPY;  Service: Endoscopy;  Laterality: N/A;   left elbow repair Left    LEFT HEART CATH AND CORONARY ANGIOGRAPHY Left 01/21/2019   Procedure: LEFT HEART CATH AND CORONARY ANGIOGRAPHY;  Surgeon: Yvonne Kendall, MD;  Location: [facility] INVASIVE CV LAB;  Service: Cardiovascular;  Laterality: Left;   LYMPH NODE DISSECTION Left 02/08/2024   Procedure: LYMPH NODE DISSECTION;  Surgeon: Carolan Shiver, MD;  Location: [facility] ORS;  Service: General;  Laterality: Left;   PORTACATH PLACEMENT N/A 02/13/2024   Procedure: INSERTION, TUNNELED CENTRAL VENOUS DEVICE, WITH PORT;  Surgeon: Carolan Shiver, MD;  Location: [facility] ORS;  Service: General;  Laterality: N/A;    SPLENECTOMY  1957   TONSILLECTOMY     as a child   Social History:  reports that he has never smoked. He has never used smokeless tobacco. He reports that he does not currently use alcohol. He reports that he does not use drugs.  No Known Allergies Family History  Adopted: Yes  Problem Relation Age of Onset   Heart attack Mother 32   Pancreatic cancer Father        died in WWII - pt adopted   Hypertension Sister    Hyperlipidemia Sister    Hypertension Brother    Hyperlipidemia Brother    Prostate cancer Neg Hx    Kidney cancer Neg Hx    Bladder Cancer Neg Hx    Family history: Family history reviewed and not pertinent.  Prior to Admission medications   Medication Sig Start Date End Date Taking? Authorizing Provider  acyclovir (ZOVIRAX) 400 MG tablet Take 1 tablet (400 mg total) by mouth 2 (two) times daily. 02/19/24  Yes Earna Coder, MD  allopurinol (ZYLOPRIM) 300 MG tablet Take 1 tablet (300 mg total) by mouth 2 (two) times daily. 02/11/24  Yes Earna Coder, MD  atorvastatin (LIPITOR) 40 MG tablet TAKE 1 TABLET(40 MG) BY MOUTH DAILY 01/26/23  Yes Marjie Skiff, NP  clopidogrel (PLAVIX) 75 MG tablet TAKE 1 TABLET(75 MG) BY MOUTH DAILY WITH BREAKFAST 02/05/24  Yes Cannady, Jolene T, NP  HYDROcodone-acetaminophen (NORCO/VICODIN) 5-325 MG tablet Take 1 tablet by mouth every 6 (six) hours as needed for up to 10 days. 02/26/24 03/07/24 Yes Cannady, Jolene T, NP  nitroGLYCERIN (NITROSTAT) 0.4 MG SL tablet Place 1 tablet (0.4 mg total) under the tongue every 5 (five) minutes as needed for chest pain. Maximum of 3 doses. 08/25/20  Yes End, Cristal Deer, MD  ondansetron (ZOFRAN) 8 MG tablet One pill every 8 hours as needed for nausea/vomitting. 02/11/24  Yes Earna Coder, MD  prochlorperazine (COMPAZINE) 10 MG tablet Take 1 tablet (10 mg total) by mouth every 6 (six) hours as needed for nausea or vomiting. 02/11/24  Yes Earna Coder, MD  Blood Glucose Monitoring  Suppl (ONETOUCH VERIO) w/Device KIT To check blood sugar twice a day with goal fasting in morning <130 and goal 2 hours after meal <180.  Write all checks down for provider visits. 02/11/24   Cannady, Corrie Dandy T, NP  clobetasol ointment (TEMOVATE) 0.05 % Apply topically as needed. 01/20/22   [provider]  FLUoxetine (PROZAC) 10 MG tablet Take 1 tablet (10 mg total) by mouth daily. 02/26/24   Cannady, Corrie Dandy T, NP  glucose blood test strip To check blood sugar twice a day with goal fasting in morning <130 and goal 2 hours after meal <180.  Write all checks down for provider visits. 02/11/24   Cannady, Corrie Dandy T, NP  Lancets (ONETOUCH ULTRASOFT) lancets 1 each by Other route 2 (two) times daily. 02/13/24   Cannady, Corrie Dandy T, NP  lidocaine (LIDODERM) 5 % Place 1 patch onto the skin daily. Remove & Discard patch within 12 hours or as directed by MD 02/26/24   Aura Dials T, NP  lidocaine-prilocaine (EMLA) cream Apply on the port. 30 -45 min  prior to port access. 02/11/24   Earna Coder, MD  losartan (COZAAR) 100 MG tablet TAKE 1 TABLET(100 MG) BY MOUTH DAILY Patient not taking: Reported on 02/26/2024 01/26/23   Aura Dials T, NP  pantoprazole (PROTONIX) 20 MG tablet TAKE 1 TABLET(20 MG) BY MOUTH TWICE DAILY Patient not taking: Reported on 02/26/2024 03/14/23   Aura Dials T, NP  predniSONE (DELTASONE) 50 MG tablet Take 2 tablets once day x 5 days.  Take in AM; with FOOD. Patient not taking: Reported on 02/26/2024 02/19/24   Earna Coder, MD   Physical Exam: Vitals:   02/26/24 1500 02/26/24 1530 02/26/24 1700 02/26/24 2029  BP: 136/68 114/75  134/81  Pulse: (!) 119 (!) 105  78  Resp: (!) 25 (!) 35  18  Temp:   98.7 F (37.1 C) 99.4 F (37.4 C)  TempSrc:    Oral  SpO2: 100% 100%  100%  Weight:      Height:       Constitutional: appears age-appropriate, NAD, calm Eyes: PERRL, lids and conjunctivae normal ENMT: Mucous membranes are moist. Posterior pharynx clear of any  exudate or lesions. Age-appropriate dentition. Hearing appropriate Neck: normal, supple, no masses, no thyromegaly Respiratory: no wheezing, no crackles. Normal respiratory effort. No accessory muscle use. Decreased lung sounds in the left lower lobe Cardiovascular: Regular rate and rhythm, no murmurs / rubs / gallops. No extremity edema. 2+ pedal pulses. No carotid bruits.  Abdomen: no tenderness, no masses palpated, no hepatosplenomegaly. Bowel sounds positive.  Musculoskeletal: no clubbing / cyanosis. No joint deformity upper and lower extremities. Good ROM,  no contractures, no atrophy. Normal muscle tone.  Skin: no rashes, lesions, ulcers. No induration Neurologic: Sensation intact. Strength 5/5 in all 4.  Psychiatric: Normal judgment and insight. Alert and oriented x 3. Normal mood.   EKG: independently reviewed, showing atrial fibrillation with rate of 154, QTc 320  Chest x-ray on Admission: I personally reviewed and I agree with radiologist reading as below.  CT Angio Chest PE W/Cm &/Or Wo Cm Result Date: 02/26/2024 CLINICAL DATA:  PE suspected. High probability. * Tracking Code: BO * EXAM: CT ANGIOGRAPHY CHEST WITH CONTRAST TECHNIQUE: Multidetector CT imaging of the chest was performed using the standard protocol during bolus administration of intravenous contrast. Multiplanar CT image reconstructions and MIPs were obtained to evaluate the vascular anatomy. RADIATION DOSE REDUCTION: This exam was performed according to the departmental dose-optimization program which includes automated exposure control, adjustment of the mA and/or kV according to patient size and/or use of iterative reconstruction technique. CONTRAST:  75mL OMNIPAQUE IOHEXOL 350 MG/ML SOLN COMPARISON:  PET-CT 02/05/2024 FINDINGS: Cardiovascular: No filling defects within the pulmonary arteries to suggest acute pulmonary embolism. Mediastinum/Nodes: Bulky mediastinum and LEFT axillary lymphadenopathy. RIGHT lower paratracheal  node measures 3.4 cm short axis compared to 3.1 cm on PET-CT. LEFT supraclavicular node measures 3.2 cm compared to 2.3 cm. LEFT axillary nodal mass with bulky nodes. Individual node measures up to 3.4 cm compared to 2.3 cm. Node beneath the pectoralis muscle measures 2.7 cm compared to 2.0 cm. Visually nodes appear larger. Lungs/Pleura: Interstitial edema pattern. no  Infarction.  No pneumonia. 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 Multiple small spleen spanning diaphragm. Adrenal glands normal. Musculoskeletal: No aggressive osseous lesion. Review of the MIP images confirms the above findings. IMPRESSION: 1. No acute pulmonary embolism. 2. No acute pulmonary parenchymal findings. 3. mild interstitial edema pattern. 4. Bulky mediastinal, supraclavicular and LEFT axillary adenopathy. The bulky LEFT axillary adenopathy is increased in size. Electronically Signed   By: Genevive Bi M.D.   On: 02/26/2024 17:07   DG Chest Port 1 View Result Date: 02/26/2024 CLINICAL DATA:  AFib with RVR EXAM: PORTABLE CHEST 1 VIEW COMPARISON:  Chest radiograph dated 02/13/2024 FINDINGS: Lines/tubes: Right chest wall port tip projects over the SVC. Lungs: Unchanged asymmetric elevation of the right hemidiaphragm. Low lung volumes with bronchovascular crowding. Hazy and patchy left lower lung opacity. Pleura: No pneumothorax or pleural effusion. Heart/mediastinum: Similar mildly enlarged cardiomediastinal silhouette. Bones: No acute osseous abnormality. IMPRESSION: Low lung volumes with bronchovascular crowding. Hazy and patchy left lower lung opacity, likely atelectasis. Aspiration or pneumonia can be considered in the appropriate clinical setting. Electronically Signed   By: Agustin Cree M.D.   On: 02/26/2024 16:30   Labs on Admission: I have personally reviewed following labs  CBC: Recent Labs  Lab 02/26/24 1353  WBC 7.8  HGB 10.0*  HCT 29.4*   MCV 104.6*  PLT 314   Basic Metabolic Panel: Recent Labs  Lab 02/26/24 1353  NA 131*  K 4.3  CL 96*  CO2 23  GLUCOSE 127*  BUN 26*  CREATININE 1.15  CALCIUM 9.0   GFR: Estimated Creatinine Clearance: 63.7 mL/min (by C-G formula based on SCr of 1.15 mg/dL).  Coagulation Profile: Recent Labs  Lab 02/26/24 1353  INR 1.0   Urine analysis:    Component Value Date/Time   COLORURINE YELLOW (A) 02/26/2024 1714   APPEARANCEUR CLEAR (A) 02/26/2024 1714   APPEARANCEUR Clear 08/19/2019 1443  LABSPEC >1.046 (H) 02/26/2024 1714   PHURINE 5.0 02/26/2024 1714   GLUCOSEU NEGATIVE 02/26/2024 1714   HGBUR NEGATIVE 02/26/2024 1714   BILIRUBINUR NEGATIVE 02/26/2024 1714   BILIRUBINUR Negative 08/19/2019 1443   KETONESUR NEGATIVE 02/26/2024 1714   PROTEINUR NEGATIVE 02/26/2024 1714   NITRITE NEGATIVE 02/26/2024 1714   LEUKOCYTESUR NEGATIVE 02/26/2024 1714   This document was prepared using Dragon Voice Recognition software and may include unintentional dictation errors.  Dr. Sedalia Muta [facility]  If 7PM-7AM, please contact overnight-coverage provider If 7AM-7PM, please contact day attending provider [email or web address]  02/26/2024, 10:51 PM

## 2024-02-26 NOTE — ED Notes (Signed)
 EDP Tan at bedside

## 2024-02-26 NOTE — ED Notes (Signed)
 Pt taken to bathroom by Nettie Elm, NT. Urine sample collected

## 2024-02-26 NOTE — ED Provider Notes (Signed)
 [facility] Provider Note    Event Date/Time   First MD Initiated Contact with Patient 02/26/24 1417     (approximate)   History   Palpitations   HPI  Jesse Norton is a 78 y.o. male with history of non-Hodgkin's with, on chemo, anxiety, BPH, GERD, CAD, presenting with shortness of breath and cough.  Was also found to be tachycardic in atrial fibrillation which he says is new for him.  Per wife patient is on Plavix but not on any other anticoagulation.  He does not have any chest pain.  He does have some shortness of breath and cough for last couple days.  No fever.  No leg swelling.  On independent review he was seen by his oncologist today, does have history of heart failure with preserved ejection fraction, not on any Lasix at this time, his history of CAD, hypertension.     Physical Exam   Triage Vital Signs: ED Triage Vitals  Encounter Vitals Group     BP 02/26/24 1338 (!) 127/91     Systolic BP Percentile --      Diastolic BP Percentile --      Pulse Rate 02/26/24 1338 (!) 150     Resp 02/26/24 1338 18     Temp 02/26/24 1341 98.3 F (36.8 C)     Temp src --      SpO2 02/26/24 1349 100 %     Weight 02/26/24 1339 234 lb 9.1 oz (106.4 kg)     Height 02/26/24 1339 5\' 10"  (1.778 m)     Head Circumference --      Peak Flow --      Pain Score 02/26/24 1339 6     Pain Loc --      Pain Education --      Exclude from Growth Chart --     Most recent vital signs: Vitals:   02/26/24 1500 02/26/24 1530  BP: 136/68 114/75  Pulse: (!) 119 (!) 105  Resp: (!) 25 (!) 35  Temp:    SpO2: 100% 100%     General: Awake, no distress.  CV:  Good peripheral perfusion.  Resp:  Normal effort.  No tachypnea or increased work of breathing Abd:  No distention.  Abdomen soft nontender Other:  No lower extremity edema or unilateral calf swelling or tenderness.   ED Results / Procedures / Treatments   Labs (all labs ordered are listed, but only  abnormal results are displayed) Labs Reviewed  BASIC METABOLIC PANEL - Abnormal; Notable for the following components:      Result Value   Sodium 131 (*)    Chloride 96 (*)    Glucose, Bld 127 (*)    BUN 26 (*)    All other components within normal limits  CBC - Abnormal; Notable for the following components:   RBC 2.81 (*)    Hemoglobin 10.0 (*)    HCT 29.4 (*)    MCV 104.6 (*)    MCH 35.6 (*)    nRBC 1.1 (*)    All other components within normal limits  TROPONIN I (HIGH SENSITIVITY) - Abnormal; Notable for the following components:   Troponin I (High Sensitivity) 19 (*)    All other components within normal limits  RESP PANEL BY RT-PCR (RSV, FLU A&B, COVID)  RVPGX2  PROTIME-INR  BRAIN NATRIURETIC PEPTIDE  URINALYSIS, W/ REFLEX TO CULTURE (INFECTION SUSPECTED)     EKG  Atrial fibrillation with RVR,  rate 154, normal QRS, normal QTc, baseline is wandering but no obvious ischemic ST elevation, he has no T wave flattening in V5, V6, this is change compared to prior since prior EKG showed normal sinus rhythm.   RADIOLOGY Chest x-ray on my interpretation showed left-sided opacities.   PROCEDURES:  Critical Care performed: Yes, see critical care procedure note(s)  .Critical Care  Performed by: Claybon Jabs, MD Authorized by: Claybon Jabs, MD   Critical care provider statement:    Critical care time (minutes):  35   Critical care was necessary to treat or prevent imminent or life-threatening deterioration of the following conditions:  Circulatory failure   Critical care was time spent personally by me on the following activities:  Development of treatment plan with patient or surrogate, discussions with consultants, evaluation of patient's response to treatment, examination of patient, ordering and review of laboratory studies, ordering and review of radiographic studies, ordering and performing treatments and interventions, pulse oximetry, re-evaluation of patient's condition  and review of old charts    MEDICATIONS ORDERED IN ED: Medications  cefTRIAXone (ROCEPHIN) 1 g in sodium chloride 0.9 % 100 mL IVPB (has no administration in time range)  doxycycline (VIBRAMYCIN) 100 mg in sodium chloride 0.9 % 250 mL IVPB (has no administration in time range)  lactated ringers bolus 500 mL (500 mLs Intravenous New Bag/Given 02/26/24 1510)  diltiazem (CARDIZEM) injection 15 mg (15 mg Intravenous Given 02/26/24 1503)  iohexol (OMNIPAQUE) 350 MG/ML injection 75 mL (75 mLs Intravenous Contrast Given 02/26/24 1448)     IMPRESSION / MDM / ASSESSMENT AND PLAN / ED COURSE  I reviewed the triage vital signs and the nursing notes.                              Differential diagnosis includes, but is not limited to, new arrhythmia, considered PE, pneumonia, ACS, electrolyte derangements, considered CHF exacerbation but he does not have any lower extremity swelling, does not appear overtly fluid overloaded at this time.  Also considered mild dehydration.  Will do a bedside ultrasound, labs, EKG, troponin, chest x-ray, CT PE study.  Patient's presentation is most consistent with acute presentation with potential threat to life or bodily function.  Bedside ultrasound was done, noted B-lines on the left, none on the right, no pleural effusion on the right.  Possible trace to small pericardial effusion seen in subxiphoid, RV is less than LV, contractility hard to assess due to the tachycardia but appears slightly diminished but also could be secondary to A-fib with RVR.  IVC is not plethoric and is collapsing.  Focal B-lines on the left could be concerning for pneumonia versus viral illness, since IVC is collapsing, will give him 500 cc of IV fluids.  Given that his heart rate is persistently in the 150s, will also give him 15 mg of IV Dilt to bring the heart rate down.  Chest x-ray on my interpretation showed left-sided opacity concerning for pneumonia given B-lines findings on exam as well  as reports of shortness of breath and cough, will start him on antibiotics here.  Clinical Course as of 02/26/24 1538  Tue Feb 26, 2024  1530 Independent review of labs, troponin is mildly elevated, BNP is normal, no leukocytosis, sodium is mildly low, otherwise electrolytes are not severely deranged. [TT]  1536 Discussed with patient about admission for his A-fib with RVR, he is agreeable with the plan. [TT]  Clinical Course User Index [TT] Claybon Jabs, MD     FINAL CLINICAL IMPRESSION(S) / ED DIAGNOSES   Final diagnoses:  Atrial fibrillation with RVR (HCC)  Shortness of breath  Acute cough  Pneumonia of left lung due to infectious organism, unspecified part of lung     Rx / DC Orders   ED Discharge Orders     None        Note:  This document was prepared using Dragon voice recognition software and may include unintentional dictation errors.

## 2024-02-26 NOTE — Assessment & Plan Note (Signed)
 -  Does not appear to be in acute exacerbation

## 2024-02-26 NOTE — Addendum Note (Signed)
 Addended by: Aura Dials T on: 02/26/2024 02:12 PM   Modules accepted: Orders

## 2024-02-26 NOTE — Assessment & Plan Note (Signed)
 Chronic, stable with no recent exacerbations.  Euvolemic.  Continue collaboration with cardiology.  Recent notes reviewed.  Recommend: - Reminded to call for an overnight weight gain of >2 pounds or a weekly weight weight of >5 pounds - not adding salt to his food and has been reading food labels. Reviewed the importance of keeping daily sodium intake to 2000mg  daily  - Avoid Ibuprofen containing products - At this time recommend attend ER due to elevation in HR, currently off all BP medications.  Discussed with Dr. Donneta Romberg who also recommends ER.

## 2024-02-26 NOTE — Assessment & Plan Note (Signed)
 Hydralazine 5 mg IV every 6 hours as needed for sbp > 165

## 2024-02-26 NOTE — ED Notes (Signed)
 Pt asked for a coca cola to drink, this tech gave pt drink. No other needs at the moment.

## 2024-02-26 NOTE — Assessment & Plan Note (Signed)
 Chronic, stable.  Controlled with Allopurinol and renal dose if needed.  Continue current medication regimen.

## 2024-02-26 NOTE — ED Provider Notes (Signed)
 Care assumed of patient from outgoing provider.  See their note for initial history, exam and plan.  Clinical Course as of 02/26/24 1559  Tue Feb 26, 2024  1530 Independent review of labs, troponin is mildly elevated, BNP is normal, no leukocytosis, sodium is mildly low, otherwise electrolytes are not severely deranged. [TT]  1536 Discussed with patient about admission for his A-fib with RVR, he is agreeable with the plan. [TT]  1538 Heart rate after diltiazem is 110. [TT]  1557 New onset afib w/ rvr, cough and dyspnea not on AC. Given IVF and dilt.  CTA with ?PNA. Given abx.  Plan to admit for new onset Afib w/ rvr.  [SM]    Clinical Course User Index [SM] Corena Herter, MD [TT] Claybon Jabs, MD     Corena Herter, MD 02/26/24 [phone]

## 2024-02-26 NOTE — Assessment & Plan Note (Signed)
 Chronic, stable.  Denies SI/HI.  Has been off benzo for one year and does not wan to restart.  Would like a daily medication, will trial Prozac, however if starts on Metoprolol again will need to monitor this.  Would avoid Celexa or Lexapro if possible as concern for higher risk of interactions and he is currently undergoing chemo.  Discussed addition of Buspar in future if needed.  Educated on this plan of  care.  May benefit palliative, discussed with oncology via secure chat.

## 2024-02-26 NOTE — Assessment & Plan Note (Signed)
Chronic, stable.  Followed by GI as needed, continue this collaboration and PPI as ordered.  Risks of PPI use were discussed with patient including bone loss, C. Diff diarrhea, pneumonia, infections, CKD, electrolyte abnormalities.  Verbalizes understanding and chooses to continue the medication. Mag level annually.

## 2024-02-26 NOTE — Assessment & Plan Note (Signed)
 Etiology workup in progress Admit to telemetry cardiac Patient heart rate improved with diltiazem 15 mg IV one-time dose per EDP Metoprolol 5 mg IV every 4 hours as needed for heart rate greater than 115, 3 doses ordered

## 2024-02-26 NOTE — Progress Notes (Signed)
 BP 104/70   Pulse (!) 148 Comment: apical  Temp 97.8 F (36.6 C) (Oral)   Ht 5' 10.5" (1.791 m)   Wt 234 lb 9.6 oz (106.4 kg)   SpO2 97%   BMI 33.19 kg/m    Subjective:    Patient ID: Jesse Norton, male    DOB: Dec 17, 1945, 78 y.o.   MRN: [id number]  HPI: Jesse Norton is a 78 y.o. male presenting on 02/26/2024 for annual exam. Current medical complaints include:none  He currently lives with: self Interim Problems from his last visit: no  HYPERTENSION / HYPERLIPIDEMIA/HF Taking Plavix and Atorvastatin.  Follows with cardiology for CAD, HFpEF and HTN, last seen 06/04/23 -- has visit coming up in 2 weeks.  His HR has been elevated the past 1 1/2 months patient reports, had been on Metoprolol but he reports this and his BP medications were stopped by oncology.  No recent NTG use, has not used since provided to him.  Takes Allopurinol for history of gout. Satisfied with current treatment? yes Duration of hypertension: chronic BP monitoring frequency: daily BP range: 115 to 148/70 range, BP medications have been stopped BP medication side effects: no Duration of hyperlipidemia: chronic Cholesterol medication side effects: no Cholesterol supplements: none Medication compliance: good compliance Aspirin: none Recent stressors: no Recurrent headaches: no Visual changes: no Palpitations: no Dyspnea: yes Chest pain: no Lower extremity edema: no Dizzy/lightheaded: no    B-CELL LYMPHOMA: Follows with oncology, last visit 02/25/25.  MALT lymphoma/low-grade B cell lymphoma/non-Hodgkin's.  On 01/18/24 was noted to have lymphadenopathy by oncology, had lymph node dissection on 02/08/24 and was noted to be positive.  He has started chemo again, port in place.  BP medications were stopped due to low readings. Having pain to lower part of back, lymph nodes pressing on spinal cord per patient.      ANXIETY/STRESS Has been off Ativan for over one year.  Would like to take something for  anxiety, but not benzo, would prefer daily medication.  PDMP review last Ativan fill 07/26/22 for #30 tablets.  Lots of depression with return of cancer.  Did try Zoloft in past without benefit. Duration: stable Anxious mood: yes Excessive worrying: no Irritability: no Sweating: no Nausea: no Palpitations:no Hyperventilation: no Panic attacks: no Agoraphobia: no  Obscessions/compulsions: no Depressed mood: yes    02/26/2024   11:19 AM 08/28/2023   11:29 AM 07/17/2023   11:28 AM 11/14/2022    8:57 AM 07/26/2022    9:49 AM  Depression screen PHQ 2/9  Decreased Interest 3 0 0 0 0  Down, Depressed, Hopeless 2 0 0 0 0  PHQ - 2 Score 5 0 0 0 0  Altered sleeping 3 0 0 0 0  Tired, decreased energy 3 1 0 1 1  Change in appetite 3 1 0 0 0  Feeling bad or failure about yourself  1 0 0 0 0  Trouble concentrating 1 0 0 0 0  Moving slowly or fidgety/restless 2 0 0 0 0  Suicidal thoughts 1 0 0 0 0  PHQ-9 Score 19 2 0 1 1  Difficult doing work/chores Somewhat difficult Not difficult at all Not difficult at all Not difficult at all Not difficult at all       02/26/2024   11:19 AM 08/28/2023   11:30 AM 11/14/2022    8:57 AM 07/26/2022    9:49 AM  GAD 7 : Generalized Anxiety Score  Nervous, Anxious, on Edge 2 1 1  1  Control/stop worrying 2 0 0 1  Worry too much - different things 2 0 0 1  Trouble relaxing 2 0 1 1  Restless 1 0 0 0  Easily annoyed or irritable 1 1 0 0  Afraid - awful might happen 1 0 0 0  Total GAD 7 Score 11 2 2 4   Anxiety Difficulty Somewhat difficult Not difficult at all Not difficult at all Not difficult at all   Functional Status Survey: Is the patient deaf or have difficulty hearing?: No Does the patient have difficulty seeing, even when wearing glasses/contacts?: No Does the patient have difficulty concentrating, remembering, or making decisions?: No Does the patient have difficulty walking or climbing stairs?: No Does the patient have difficulty dressing or  bathing?: No Does the patient have difficulty doing errands alone such as visiting a doctor's office or shopping?: No  FALL RISK:    08/28/2023   11:29 AM 07/17/2023   11:31 AM 11/14/2022    8:56 AM 07/26/2022    9:49 AM 07/04/2022    1:27 PM  Fall Risk   Falls in the past year? 0 1 0 1 1  Number falls in past yr: 0 0 0 0 0  Injury with Fall? 0 0 0 0 0  Risk for fall due to : No Fall Risks History of fall(s) No Fall Risks History of fall(s)   Follow up Falls evaluation completed Falls prevention discussed Falls evaluation completed Falls evaluation completed Falls evaluation completed;Education provided;Falls prevention discussed   Advanced Directives Does patient have a HCPOA?    yes If yes, name and contact information:  Does patient have a living will or MOST form?  yes  Past Medical History:  Past Medical History:  Diagnosis Date   (HFpEF) heart failure with preserved ejection fraction (HCC) 01/21/2019   a.) LHC 01/21/2019: EF 55-60%, LVEDP 25-30 mmHg   Anxiety    Aortic atherosclerosis (HCC)    Ascending aorta dilatation (HCC) 07/16/2023   a.) TTE 07/16/2023: 38 mm   Barrett's esophagus    BPH (benign prostatic hyperplasia)    CAD (coronary artery disease) 10/2018   a.) ETT 11/06/2018: 1mm horizontal inflat ST depression, freq PVC's -> intermediate risk; b.) cCTA 12/23/2018: Ca2+ 2408 (93rd %'ile); FFR 0.6 mLAD, 0.87 mLCx, 0.77 dLCx --> cath recommended; c.) LHC/PCI 01/01/2019:  40% pLAD, 85% mLAD, 70% D2, 70% pLCx (2.75 x 15 mm Resolute Onyx DES), RCA min irregs -- staged PCI; d.) staged PCI 01/21/2019: orbital athrectomy + 3.0 x 15 mm Resolute Onyx DES mLAD   Chronic heart failure with preserved ejection fraction (HCC)    Colon polyps    Duodenal adenoma    Gastritis    GERD (gastroesophageal reflux disease)    Gout    H. pylori infection    H/O urethral stricture    Hemorrhoids    Hiatal hernia    History of bilateral cataract extraction 2023   History of splenectomy  1957   Hypertension    Mucosa-associated lymphoid tissue (MALT) lymphoma    Pre-diabetes    Pulmonary emphysema (HCC)    Stomach ulcer     Surgical History:  Past Surgical History:  Procedure Laterality Date   CATARACT EXTRACTION W/PHACO Left 03/07/2022   Procedure: CATARACT EXTRACTION PHACO AND INTRAOCULAR LENS PLACEMENT (IOC) LEFT VIVITY TORIC LENS 10.41 00:55.7;  Surgeon: Galen Manila, MD;  Location: [facility];  Service: Ophthalmology;  Laterality: Left;   CATARACT EXTRACTION W/PHACO Right 03/28/2022   Procedure:  CATARACT EXTRACTION PHACO AND INTRAOCULAR LENS PLACEMENT (IOC) RIGHT VIVITY LENS 6.68 00:50.0;  Surgeon: Galen Manila, MD;  Location: [facility];  Service: Ophthalmology;  Laterality: Right;   COLONOSCOPY     COLONOSCOPY N/A 06/19/2022   Procedure: COLONOSCOPY;  Surgeon: Jaynie Collins, DO;  Location: [facility] ENDOSCOPY;  Service: Gastroenterology;  Laterality: N/A;   COLONOSCOPY WITH PROPOFOL N/A 08/20/2018   Procedure: COLONOSCOPY WITH PROPOFOL;  Surgeon: Christena Deem, MD;  Location: [facility];  Service: Endoscopy;  Laterality: N/A;   COLONOSCOPY WITH PROPOFOL N/A 12/19/2018   Procedure: COLONOSCOPY WITH PROPOFOL;  Surgeon: Christena Deem, MD;  Location: [facility] ENDOSCOPY;  Service: Endoscopy;  Laterality: N/A;   CORONARY ATHERECTOMY N/A 02/03/2019   Procedure: CORONARY ATHERECTOMY;  Surgeon: Yvonne Kendall, MD;  Location: [facility];  Service: Cardiovascular;  Laterality: N/A;   CORONARY STENT INTERVENTION N/A 01/21/2019   Procedure: CORONARY STENT INTERVENTION;  Surgeon: Yvonne Kendall, MD;  Location: [facility];  Service: Cardiovascular;  Laterality: N/A;   CORONARY STENT INTERVENTION N/A 02/03/2019   Procedure: CORONARY STENT INTERVENTION;  Surgeon: Yvonne Kendall, MD;  Location: [facility];  Service: Cardiovascular;  Laterality: N/A;   CORONARY ULTRASOUND/IVUS N/A 02/03/2019   Procedure:  Intravascular Ultrasound/IVUS;  Surgeon: Yvonne Kendall, MD;  Location: [facility];  Service: Cardiovascular;  Laterality: N/A;   CYSTOSCOPY WITH URETHRAL DILATATION N/A 08/16/2018   Procedure: CYSTOSCOPY WITH URETHRAL DILATATION;  Surgeon: Sondra Come, MD;  Location: [facility];  Service: Urology;  Laterality: N/A;   ESOPHAGOGASTRODUODENOSCOPY N/A 01/29/2017   Procedure: ESOPHAGOGASTRODUODENOSCOPY (EGD);  Surgeon: Christena Deem, MD;  Location: [facility] ENDOSCOPY;  Service: Endoscopy;  Laterality: N/A;   ESOPHAGOGASTRODUODENOSCOPY N/A 06/19/2022   Procedure: ESOPHAGOGASTRODUODENOSCOPY (EGD);  Surgeon: Jaynie Collins, DO;  Location[facility] ENDOSCOPY;  Service: Gastroenterology;  Laterality: N/A;   ESOPHAGOGASTRODUODENOSCOPY (EGD) WITH PROPOFOL N/A 07/10/2016   Procedure: ESOPHAGOGASTRODUODENOSCOPY (EGD) WITH PROPOFOL;  Surgeon: Christena Deem, MD;  Location: [facility] ENDOSCOPY;  Service: Endoscopy;  Laterality: N/A;   ESOPHAGOGASTRODUODENOSCOPY (EGD) WITH PROPOFOL N/A 12/19/2018   Procedure: ESOPHAGOGASTRODUODENOSCOPY (EGD) WITH PROPOFOL;  Surgeon: Christena Deem, MD;  Location: [facility] ENDOSCOPY;  Service: Endoscopy;  Laterality: N/A;   left elbow repair Left    LEFT HEART CATH AND CORONARY ANGIOGRAPHY Left 01/21/2019   Procedure: LEFT HEART CATH AND CORONARY ANGIOGRAPHY;  Surgeon: Yvonne Kendall, MD;  Location: [facility];  Service: Cardiovascular;  Laterality: Left;   LYMPH NODE DISSECTION Left 02/08/2024   Procedure: LYMPH NODE DISSECTION;  Surgeon: Carolan Shiver, MD;  Location: [facility];  Service: General;  Laterality: Left;   PORTACATH PLACEMENT N/A 02/13/2024   Procedure: INSERTION, TUNNELED CENTRAL VENOUS DEVICE, WITH PORT;  Surgeon: Carolan Shiver, MD;  Location: [facility];  Service: General;  Laterality: N/A;   SPLENECTOMY  1957   TONSILLECTOMY     as a child    Medications:  Current Outpatient Medications on File Prior to Visit  Medication  Sig   acyclovir (ZOVIRAX) 400 MG tablet Take 1 tablet (400 mg total) by mouth 2 (two) times daily.   allopurinol (ZYLOPRIM) 300 MG tablet Take 1 tablet (300 mg total) by mouth 2 (two) times daily.   atorvastatin (LIPITOR) 40 MG tablet TAKE 1 TABLET(40 MG) BY MOUTH DAILY   Blood Glucose Monitoring Suppl (ONETOUCH VERIO) w/Device KIT To check blood sugar twice a day with goal fasting in morning <130 and goal 2 hours after meal <180.  Write all checks down  for provider visits.   clobetasol ointment (TEMOVATE) 0.05 % Apply topically as needed.   clopidogrel (PLAVIX) 75 MG tablet TAKE 1 TABLET(75 MG) BY MOUTH DAILY WITH BREAKFAST   glucose blood test strip To check blood sugar twice a day with goal fasting in morning <130 and goal 2 hours after meal <180.  Write all checks down for provider visits.   Lancets (ONETOUCH ULTRASOFT) lancets 1 each by Other route 2 (two) times daily.   lidocaine-prilocaine (EMLA) cream Apply on the port. 30 -45 min  prior to port access.   nitroGLYCERIN (NITROSTAT) 0.4 MG SL tablet Place 1 tablet (0.4 mg total) under the tongue every 5 (five) minutes as needed for chest pain. Maximum of 3 doses.   ondansetron (ZOFRAN) 8 MG tablet One pill every 8 hours as needed for nausea/vomitting.   pantoprazole (PROTONIX) 20 MG tablet TAKE 1 TABLET(20 MG) BY MOUTH TWICE DAILY   predniSONE (DELTASONE) 50 MG tablet Take 2 tablets once day x 5 days.  Take in AM; with FOOD.   prochlorperazine (COMPAZINE) 10 MG tablet Take 1 tablet (10 mg total) by mouth every 6 (six) hours as needed for nausea or vomiting.   amLODipine (NORVASC) 10 MG tablet TAKE 1 TABLET(10 MG) BY MOUTH DAILY (Patient not taking: Reported on 02/26/2024)   furosemide (LASIX) 20 MG tablet Take 1 tablet (20 mg total) by mouth daily. (Patient not taking: Reported on 02/26/2024)   losartan (COZAAR) 100 MG tablet TAKE 1 TABLET(100 MG) BY MOUTH DAILY (Patient not taking: Reported on 02/26/2024)   No current facility-administered  medications on file prior to visit.    Allergies:  No Known Allergies  Social History:  Social History   Socioeconomic History   Marital status: Married    Spouse name: Marylu Lund   Number of children: 2   Years of education: Not on file   Highest education level: Master's degree (e.g., MA, MS, MEng, MEd, MSW, MBA)  Occupational History   Occupation: retired     Comment: Psychologist, occupational  Tobacco Use   Smoking status: Never   Smokeless tobacco: Never  Vaping Use   Vaping status: Never Used  Substance and Sexual Activity   Alcohol use: Not Currently   Drug use: No   Sexual activity: Yes  Other Topics Concern   Not on file  Social History Narrative   Married, 2 sons, 1 in [place], [place] and 1 in [employer], stationary bike      No smoking.  Occasional alcohol.  Lives at home.  Retired Psychologist, occupational.   Social Drivers of Corporate investment banker Strain: Low Risk  (01/29/2024)   Received from [facility]   Overall Financial Resource Strain (CARDIA)    Difficulty of Paying Living Expenses: Not hard at all  Food Insecurity: No Food Insecurity (01/29/2024)   Received from [facility]   Hunger Vital Sign    Worried About Running Out of Food in the Last Year: Never true    Ran Out of Food in the Last Year: Never true  Transportation Needs: No Transportation Needs (01/29/2024)   Received from [facility] - Transportation    In the past 12 months, has lack of transportation kept you from medical appointments or from getting medications?: No    Lack of Transportation (Non-Medical): No  Physical Activity: Insufficiently Active (07/17/2023)   Exercise Vital Sign    Days of Exercise per Week: 4  days    Minutes of Exercise per Session: 30 min  Stress: No Stress Concern Present (07/17/2023)   [employer] of Occupational Health - Occupational Stress Questionnaire    Feeling of Stress : Only a little  Social Connections:  Moderately Integrated (07/17/2023)   Social Connection and Isolation Panel [NHANES]    Frequency of Communication with Friends and Family: Three times a week    Frequency of Social Gatherings with Friends and Family: Three times a week    Attends Religious Services: More than 4 times per year    Active Member of Clubs or Organizations: No    Attends Banker Meetings: Never    Marital Status: Married  Catering manager Violence: Not At Risk (07/17/2023)   Humiliation, Afraid, Rape, and Kick questionnaire    Fear of Current or Ex-Partner: No    Emotionally Abused: No    Physically Abused: No    Sexually Abused: No   Social History   Tobacco Use  Smoking Status Never  Smokeless Tobacco Never   Social History   Substance and Sexual Activity  Alcohol Use Not Currently    Family History:  Family History  Adopted: Yes  Problem Relation Age of Onset   Heart attack Mother 32   Pancreatic cancer Father        died in WWII - pt adopted   Hypertension Sister    Hyperlipidemia Sister    Hypertension Brother    Hyperlipidemia Brother    Prostate cancer Neg Hx    Kidney cancer Neg Hx    Bladder Cancer Neg Hx     Past medical history, surgical history, medications, allergies, family history and social history reviewed with patient today and changes made to appropriate areas of the chart.   ROS All other ROS negative except what is listed above and in the HPI.      Objective:    BP 104/70   Pulse (!) 148 Comment: apical  Temp 97.8 F (36.6 C) (Oral)   Ht 5' 10.5" (1.791 m)   Wt 234 lb 9.6 oz (106.4 kg)   SpO2 97%   BMI 33.19 kg/m   Wt Readings from Last 3 Encounters:  02/26/24 234 lb 9.6 oz (106.4 kg)  02/19/24 235 lb 1.6 oz (106.6 kg)  02/13/24 235 lb (106.6 kg)    Physical Exam Vitals and nursing note reviewed.  Constitutional:      General: He is awake. He is not in acute distress.    Appearance: He is well-developed and well-groomed. He is obese. He is  not ill-appearing or toxic-appearing.  HENT:     Head: Normocephalic and atraumatic.     Right Ear: Hearing, tympanic membrane, ear canal and external ear normal. No drainage.     Left Ear: Hearing, tympanic membrane, ear canal and external ear normal. No drainage.     Nose: Nose normal.     Mouth/Throat:     Pharynx: Uvula midline.  Eyes:     General: Lids are normal.        Right eye: No discharge.        Left eye: No discharge.     Extraocular Movements: Extraocular movements intact.     Conjunctiva/sclera: Conjunctivae normal.     Pupils: Pupils are equal, round, and reactive to light.     Visual Fields: Right eye visual fields normal and left eye visual fields normal.  Neck:     Thyroid: No thyromegaly.  Vascular: No carotid bruit or JVD.     Trachea: Trachea normal.  Cardiovascular:     Rate and Rhythm: Tachycardia present. Rhythm irregularly irregular.     Heart sounds: Normal heart sounds, S1 normal and S2 normal. No murmur heard.    No gallop.  Pulmonary:     Effort: Pulmonary effort is normal. No accessory muscle usage or respiratory distress.     Breath sounds: Normal breath sounds.     Comments: Some SOB with walking noted.   Abdominal:     General: Bowel sounds are normal.     Palpations: Abdomen is soft. There is no hepatomegaly or splenomegaly.     Tenderness: There is no abdominal tenderness.  Musculoskeletal:        General: Normal range of motion.     Cervical back: Normal range of motion and neck supple.     Right lower leg: No edema.     Left lower leg: No edema.     Comments: Using hiking poles to assist with mobility.  Lymphadenopathy:     Head:     Right side of head: Submental, submandibular and tonsillar adenopathy present. No preauricular or posterior auricular adenopathy.     Left side of head: Submental, submandibular and tonsillar adenopathy present. No preauricular or posterior auricular adenopathy.     Cervical: Cervical adenopathy present.   Skin:    General: Skin is warm and dry.     Capillary Refill: Capillary refill takes less than 2 seconds.     Findings: Bruising present. No rash.     Comments: Scattered bruises to bilateral upper extremities.  Neurological:     Mental Status: He is alert and oriented to person, place, and time.     Gait: Gait is intact.     Deep Tendon Reflexes: Reflexes are normal and symmetric.     Reflex Scores:      Brachioradialis reflexes are 2+ on the right side and 2+ on the left side.      Patellar reflexes are 2+ on the right side and 2+ on the left side. Psychiatric:        Attention and Perception: Attention normal.        Mood and Affect: Mood normal.        Speech: Speech normal.        Behavior: Behavior normal. Behavior is cooperative.        Thought Content: Thought content normal.        Cognition and Memory: Cognition normal.       07/17/2023   11:33 AM 07/04/2022    1:29 PM 06/20/2021    9:10 AM 06/11/2020    9:09 AM 08/19/2019    2:23 PM  6CIT Screen  What Year? 0 points 0 points 0 points 0 points 0 points  What month? 0 points 0 points 0 points 0 points 0 points  What time? 0 points 0 points 0 points 0 points 0 points  Count back from 20 0 points 0 points 0 points 0 points 0 points  Months in reverse 0 points 0 points 0 points 0 points 0 points  Repeat phrase 0 points 0 points 0 points 0 points 0 points  Total Score 0 points 0 points 0 points 0 points 0 points     Results for orders placed or performed in visit on 02/19/24  CMP (Cancer Center only)   Collection Time: 02/19/24  9:37 AM  Result Value Ref Range   Sodium  132 (L) 135 - 145 mmol/L   Potassium 4.0 3.5 - 5.1 mmol/L   Chloride 95 (L) 98 - 111 mmol/L   CO2 26 22 - 32 mmol/L   Glucose, Bld 126 (H) 70 - 99 mg/dL   BUN 33 (H) 8 - 23 mg/dL   Creatinine 7.82 9.56 - 1.24 mg/dL   Calcium 8.9 8.9 - 21.3 mg/dL   Total Protein 6.0 (L) 6.5 - 8.1 g/dL   Albumin 2.9 (L) 3.5 - 5.0 g/dL   AST 49 (H) 15 - 41 U/L   ALT 42  0 - 44 U/L   Alkaline Phosphatase 48 38 - 126 U/L   Total Bilirubin 1.2 0.0 - 1.2 mg/dL   GFR, Estimated >08 >65 mL/min   Anion gap 11 5 - 15  CBC with Differential (Cancer Center Only)   Collection Time: 02/19/24  9:37 AM  Result Value Ref Range   WBC Count 9.5 4.0 - 10.5 K/uL   RBC 2.83 (L) 4.22 - 5.81 MIL/uL   Hemoglobin 10.0 (L) 13.0 - 17.0 g/dL   HCT 78.4 (L) 69.6 - 29.5 %   MCV 104.6 (H) 80.0 - 100.0 fL   MCH 35.3 (H) 26.0 - 34.0 pg   MCHC 33.8 30.0 - 36.0 g/dL   RDW 28.4 13.2 - 44.0 %   Platelet Count 322 150 - 400 K/uL   nRBC 0.5 (H) 0.0 - 0.2 %   Neutrophils Relative % 77 %   Neutro Abs 7.3 1.7 - 7.7 K/uL   Lymphocytes Relative 14 %   Lymphs Abs 1.3 0.7 - 4.0 K/uL   Monocytes Relative 7 %   Monocytes Absolute 0.7 0.1 - 1.0 K/uL   Eosinophils Relative 1 %   Eosinophils Absolute 0.1 0.0 - 0.5 K/uL   Basophils Relative 0 %   Basophils Absolute 0.0 0.0 - 0.1 K/uL   Immature Granulocytes 1 %   Abs Immature Granulocytes 0.08 (H) 0.00 - 0.07 K/uL      Assessment & Plan:   Problem List Items Addressed This Visit       Cardiovascular and Mediastinum   Chronic heart failure with preserved ejection fraction (HCC)   Chronic, stable with no recent exacerbations.  Euvolemic.  Continue collaboration with cardiology.  Recent notes reviewed.  Recommend: - Reminded to call for an overnight weight gain of >2 pounds or a weekly weight weight of >5 pounds - not adding salt to his food and has been reading food labels. Reviewed the importance of keeping daily sodium intake to 2000mg  daily  - Avoid Ibuprofen containing products - At this time recommend attend ER due to elevation in HR, currently off all BP medications.  Discussed with Dr. Donneta Romberg who also recommends ER.      Coronary artery disease of native artery of native heart with stable angina pectoris (HCC)   Chronic, stable.  No recent NTG use.  Continue current medication regimen and collaboration with cardiology.  To  alert provider immediately if CP presents or go immediately to ER. -  Spoke to Dr. Donneta Romberg via secure chat.  Discussed with patient would like him to go to ER for evaluation of elevation in HR, concern for a fib.  Has been off all BP medications for weeks, including Metoprolol, due to low readings and treatments.       Relevant Medications   FLUoxetine (PROZAC) 10 MG tablet   HYDROcodone-acetaminophen (NORCO/VICODIN) 5-325 MG tablet   Essential hypertension   Chronic with BP well below  goal.  Off all BP medications per oncology. Recommend he continue to monitor BP at home and document + focus on DASH diet and regular exercise.  At this time recommend ER visit due to elevation in HR, concern for a fib.  Is off BB.  Sent secure chat to Dr. Donneta Romberg who also recommends ER.      Relevant Orders   TSH   Purpura senilis (HCC)   Chronic, ongoing.  Recommend gentle skin care at home and monitor for abrasions or skin breakdown, alert provider if present.        Digestive   Barrett's esophagus   Chronic, stable.  Followed by GI as needed, continue this collaboration and PPI as ordered.  Risks of PPI use were discussed with patient including bone loss, C. Diff diarrhea, pneumonia, infections, CKD, electrolyte abnormalities.  Verbalizes understanding and chooses to continue the medication. Mag level annually.          Other   Chronic anxiety   Refer to depression plan of care.      Relevant Medications   FLUoxetine (PROZAC) 10 MG tablet   Depression, recurrent (HCC)   Chronic, stable.  Denies SI/HI.  Has been off benzo for one year and does not wan to restart.  Would like a daily medication, will trial Prozac, however if starts on Metoprolol again will need to monitor this.  Would avoid Celexa or Lexapro if possible as concern for higher risk of interactions and he is currently undergoing chemo.  Discussed addition of Buspar in future if needed.  Educated on this plan of  care.  May  benefit palliative, discussed with oncology via secure chat.      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 Spoke to Dr. Donneta Romberg via secure chat.  Discussed with patient would like him to go to ER for evaluation of elevation in HR, concern for a fib.  Has been off all BP medications for weeks, including Metoprolol, due to low readings and treatments.  Continue collaboration with oncology and may benefit palliative team involvement for pain management.  Sent in Norco for lower back pain + Lidocaine patches, may help with sleep and pain.      Relevant Medications   HYDROcodone-acetaminophen (NORCO/VICODIN) 5-325 MG tablet   Gout   Chronic, stable.  Controlled with Allopurinol and renal dose if needed.  Continue current medication regimen.        Hyperlipidemia LDL goal <70   Obesity   BMI 33.19 with underlying cardiac issues and prediabetes.  Educated on this medication at length.  Recommended eating smaller high protein, low fat meals more frequently and exercising 30 mins a day 5 times a week with a goal of 10-15lb weight loss in the next 3 months. Patient voiced their understanding and motivation to adhere to these recommendations.         Prediabetes   Ongoing.  6% in September.  Will reach out to oncology to see if they can obtain A1c and TSH on upcoming labs, is on Prednisone.  Unable to draw labs today, no return on attempt.      Relevant Orders   Bayer DCA Hb A1c Waived   Microalbumin, Urine Waived    Discussed aspirin prophylaxis for myocardial infarction prevention and decision was made to continue ASA  LABORATORY TESTING:  Health maintenance labs ordered today as discussed above.  The natural history of prostate cancer and ongoing controversy regarding screening and potential treatment outcomes of prostate cancer has been discussed with the patient. The  meaning of a false positive PSA and a false negative PSA has been discussed. He indicates understanding of the limitations of this screening test and wishes to proceed with screening PSA testing.   IMMUNIZATIONS:   - Tdap: Tetanus vaccination status reviewed: last tetanus booster within 10 years. - Influenza: Up to date - Pneumovax: Up to date - Prevnar: Up to date - Zostavax vaccine: Refused  SCREENING: - Colonoscopy: Up to date  Discussed with patient purpose of the colonoscopy is to detect colon cancer at curable precancerous or early stages   - AAA Screening: Not applicable  -Hearing Test: Not applicable  -Spirometry: Not applicable   PATIENT COUNSELING:    Sexuality: Discussed sexually transmitted diseases, partner selection, use of condoms, avoidance of unintended pregnancy  and contraceptive alternatives.   Advised to avoid cigarette smoking.  I discussed with the patient that most people either abstain from alcohol or drink within safe limits (<=14/week and <=4 drinks/occasion for males, <=7/weeks and <= 3 drinks/occasion for females) and that the risk for alcohol disorders and other health effects rises proportionally with the number of drinks per week and how often a drinker exceeds daily limits.  Discussed cessation/primary prevention of drug use and availability of treatment for abuse.   Diet: Encouraged to adjust caloric intake to maintain  or achieve ideal body weight, to reduce intake of dietary saturated fat and total fat, to limit sodium intake by avoiding high sodium foods and not adding table salt, and to maintain adequate dietary potassium and calcium preferably from fresh fruits, vegetables, and low-fat dairy products.    Stressed the importance of regular exercise  Injury prevention: Discussed safety belts, safety helmets, smoke detector, smoking near bedding or upholstery.   Dental health: Discussed importance of regular tooth brushing, flossing, and dental  visits.   Follow up plan: NEXT PREVENTATIVE PHYSICAL DUE IN 1 YEAR. Return in about 3 months (around 05/28/2024) for HTN/HF, CANCER. Sees cardiology next week and has lots of upcoming treatments and visit, discussed with him if needed he can see me sooner.

## 2024-02-26 NOTE — Assessment & Plan Note (Signed)
 Chronic, stable.  No recent NTG use.  Continue current medication regimen and collaboration with cardiology.  To alert provider immediately if CP presents or go immediately to ER. -  Spoke to Dr. Donneta Romberg via secure chat.  Discussed with patient would like him to go to ER for evaluation of elevation in HR, concern for a fib.  Has been off all BP medications for weeks, including Metoprolol, due to low readings and treatments.

## 2024-02-27 ENCOUNTER — Encounter: Payer: Self-pay | Admitting: Internal Medicine

## 2024-02-27 ENCOUNTER — Telehealth: Payer: Self-pay | Admitting: *Deleted

## 2024-02-27 ENCOUNTER — Other Ambulatory Visit (HOSPITAL_COMMUNITY): Payer: Self-pay

## 2024-02-27 ENCOUNTER — Other Ambulatory Visit: Payer: Self-pay

## 2024-02-27 ENCOUNTER — Telehealth (HOSPITAL_COMMUNITY): Payer: Self-pay | Admitting: Pharmacy Technician

## 2024-02-27 ENCOUNTER — Inpatient Hospital Stay

## 2024-02-27 ENCOUNTER — Inpatient Hospital Stay: Admitting: Internal Medicine

## 2024-02-27 DIAGNOSIS — I480 Paroxysmal atrial fibrillation: Secondary | ICD-10-CM | POA: Diagnosis present

## 2024-02-27 DIAGNOSIS — I10 Essential (primary) hypertension: Secondary | ICD-10-CM

## 2024-02-27 DIAGNOSIS — F419 Anxiety disorder, unspecified: Secondary | ICD-10-CM | POA: Diagnosis present

## 2024-02-27 DIAGNOSIS — E669 Obesity, unspecified: Secondary | ICD-10-CM | POA: Diagnosis present

## 2024-02-27 DIAGNOSIS — Z955 Presence of coronary angioplasty implant and graft: Secondary | ICD-10-CM | POA: Diagnosis not present

## 2024-02-27 DIAGNOSIS — I4719 Other supraventricular tachycardia: Secondary | ICD-10-CM | POA: Diagnosis not present

## 2024-02-27 DIAGNOSIS — K219 Gastro-esophageal reflux disease without esophagitis: Secondary | ICD-10-CM | POA: Diagnosis present

## 2024-02-27 DIAGNOSIS — Z800 Family history of malignant neoplasm of digestive organs: Secondary | ICD-10-CM | POA: Diagnosis not present

## 2024-02-27 DIAGNOSIS — I4891 Unspecified atrial fibrillation: Secondary | ICD-10-CM | POA: Diagnosis not present

## 2024-02-27 DIAGNOSIS — R7303 Prediabetes: Secondary | ICD-10-CM | POA: Diagnosis present

## 2024-02-27 DIAGNOSIS — C8333 Diffuse large B-cell lymphoma, intra-abdominal lymph nodes: Secondary | ICD-10-CM

## 2024-02-27 DIAGNOSIS — I5032 Chronic diastolic (congestive) heart failure: Secondary | ICD-10-CM

## 2024-02-27 DIAGNOSIS — E785 Hyperlipidemia, unspecified: Secondary | ICD-10-CM | POA: Diagnosis present

## 2024-02-27 DIAGNOSIS — Z83438 Family history of other disorder of lipoprotein metabolism and other lipidemia: Secondary | ICD-10-CM | POA: Diagnosis not present

## 2024-02-27 DIAGNOSIS — G8929 Other chronic pain: Secondary | ICD-10-CM | POA: Diagnosis present

## 2024-02-27 DIAGNOSIS — I2489 Other forms of acute ischemic heart disease: Secondary | ICD-10-CM | POA: Diagnosis present

## 2024-02-27 DIAGNOSIS — N400 Enlarged prostate without lower urinary tract symptoms: Secondary | ICD-10-CM | POA: Diagnosis present

## 2024-02-27 DIAGNOSIS — Z9081 Acquired absence of spleen: Secondary | ICD-10-CM | POA: Diagnosis not present

## 2024-02-27 DIAGNOSIS — R0602 Shortness of breath: Secondary | ICD-10-CM | POA: Diagnosis present

## 2024-02-27 DIAGNOSIS — C884 Extranodal marginal zone b-cell lymphoma of mucosa-associated lymphoid tissue (malt-lymphoma) not having achieved remission: Secondary | ICD-10-CM | POA: Diagnosis present

## 2024-02-27 DIAGNOSIS — R7989 Other specified abnormal findings of blood chemistry: Secondary | ICD-10-CM

## 2024-02-27 DIAGNOSIS — M7989 Other specified soft tissue disorders: Secondary | ICD-10-CM | POA: Diagnosis not present

## 2024-02-27 DIAGNOSIS — Z6833 Body mass index (BMI) 33.0-33.9, adult: Secondary | ICD-10-CM | POA: Diagnosis not present

## 2024-02-27 DIAGNOSIS — I2510 Atherosclerotic heart disease of native coronary artery without angina pectoris: Secondary | ICD-10-CM | POA: Diagnosis present

## 2024-02-27 DIAGNOSIS — J439 Emphysema, unspecified: Secondary | ICD-10-CM | POA: Diagnosis present

## 2024-02-27 DIAGNOSIS — Z8711 Personal history of peptic ulcer disease: Secondary | ICD-10-CM | POA: Diagnosis not present

## 2024-02-27 DIAGNOSIS — Z8572 Personal history of non-Hodgkin lymphomas: Secondary | ICD-10-CM | POA: Diagnosis not present

## 2024-02-27 DIAGNOSIS — Z79899 Other long term (current) drug therapy: Secondary | ICD-10-CM | POA: Diagnosis not present

## 2024-02-27 DIAGNOSIS — Z8249 Family history of ischemic heart disease and other diseases of the circulatory system: Secondary | ICD-10-CM | POA: Diagnosis not present

## 2024-02-27 DIAGNOSIS — Z7902 Long term (current) use of antithrombotics/antiplatelets: Secondary | ICD-10-CM | POA: Diagnosis not present

## 2024-02-27 DIAGNOSIS — I110 Hypertensive heart disease with heart failure: Secondary | ICD-10-CM | POA: Diagnosis present

## 2024-02-27 LAB — CBC
HCT: 26.5 % — ABNORMAL LOW (ref 39.0–52.0)
Hemoglobin: 9.1 g/dL — ABNORMAL LOW (ref 13.0–17.0)
MCH: 36.1 pg — ABNORMAL HIGH (ref 26.0–34.0)
MCHC: 34.3 g/dL (ref 30.0–36.0)
MCV: 105.2 fL — ABNORMAL HIGH (ref 80.0–100.0)
Platelets: 281 10*3/uL (ref 150–400)
RBC: 2.52 MIL/uL — ABNORMAL LOW (ref 4.22–5.81)
RDW: 14.8 % (ref 11.5–15.5)
WBC: 6.8 10*3/uL (ref 4.0–10.5)
nRBC: 0.9 % — ABNORMAL HIGH (ref 0.0–0.2)

## 2024-02-27 LAB — BASIC METABOLIC PANEL
Anion gap: 8 (ref 5–15)
BUN: 17 mg/dL (ref 8–23)
CO2: 26 mmol/L (ref 22–32)
Calcium: 8.2 mg/dL — ABNORMAL LOW (ref 8.9–10.3)
Chloride: 99 mmol/L (ref 98–111)
Creatinine, Ser: 0.81 mg/dL (ref 0.61–1.24)
GFR, Estimated: >60 mL/min (ref >60)
Glucose, Bld: 102 mg/dL — ABNORMAL HIGH (ref 70–99)
Potassium: 3.9 mmol/L (ref 3.5–5.1)
Sodium: 133 mmol/L — ABNORMAL LOW (ref 135–145)

## 2024-02-27 LAB — PROTIME-INR
INR: 1.3 — ABNORMAL HIGH (ref 0.8–1.2)
Prothrombin Time: 16.4 s — ABNORMAL HIGH (ref 11.4–15.2)

## 2024-02-27 LAB — TROPONIN I (HIGH SENSITIVITY): Troponin I (High Sensitivity): 16 ng/L (ref <18)

## 2024-02-27 LAB — APTT: aPTT: 113 s — ABNORMAL HIGH (ref 24–36)

## 2024-02-27 MED ORDER — HEPARIN (PORCINE) 25000 UT/250ML-% IV SOLN
1250.0000 [IU]/h | INTRAVENOUS | Status: DC
  Administered 2024-02-27: 1350 [IU]/h via INTRAVENOUS
  Administered 2024-02-28 – 2024-02-29 (×2): 1250 [IU]/h via INTRAVENOUS
  Filled 2024-02-27 (×3): qty 250

## 2024-02-27 MED ORDER — AMIODARONE HCL 200 MG PO TABS
400.0000 mg | ORAL_TABLET | Freq: Two times a day (BID) | ORAL | Status: DC
  Administered 2024-02-27 – 2024-02-29 (×5): 400 mg via ORAL
  Filled 2024-02-27 (×5): qty 2

## 2024-02-27 MED ORDER — FUROSEMIDE 10 MG/ML IJ SOLN
40.0000 mg | Freq: Once | INTRAMUSCULAR | Status: AC
  Administered 2024-02-27: 40 mg via INTRAVENOUS
  Filled 2024-02-27: qty 4

## 2024-02-27 MED ORDER — METOPROLOL TARTRATE 25 MG PO TABS
12.5000 mg | ORAL_TABLET | Freq: Two times a day (BID) | ORAL | Status: DC
  Administered 2024-02-27: 12.5 mg via ORAL
  Filled 2024-02-27: qty 1

## 2024-02-27 MED ORDER — MORPHINE SULFATE (PF) 2 MG/ML IV SOLN
2.0000 mg | INTRAVENOUS | Status: DC | PRN
  Administered 2024-02-27 – 2024-02-28 (×2): 2 mg via INTRAVENOUS
  Filled 2024-02-27 (×2): qty 1

## 2024-02-27 MED ORDER — METOPROLOL TARTRATE 5 MG/5ML IV SOLN
5.0000 mg | INTRAVENOUS | Status: DC | PRN
  Administered 2024-02-27: 5 mg via INTRAVENOUS

## 2024-02-27 MED ORDER — HEPARIN BOLUS VIA INFUSION
4800.0000 [IU] | Freq: Once | INTRAVENOUS | Status: AC
  Administered 2024-02-27: 4800 [IU] via INTRAVENOUS
  Filled 2024-02-27: qty 4800

## 2024-02-27 MED ORDER — METOPROLOL TARTRATE 25 MG PO TABS
37.5000 mg | ORAL_TABLET | Freq: Once | ORAL | Status: AC
  Administered 2024-02-27: 37.5 mg via ORAL
  Filled 2024-02-27: qty 2

## 2024-02-27 MED ORDER — OXYCODONE HCL 5 MG PO TABS: 5.0000 mg | ORAL_TABLET | ORAL | Status: DC | PRN

## 2024-02-27 MED ORDER — AMIODARONE IV BOLUS ONLY 150 MG/100ML
150.0000 mg | Freq: Once | INTRAVENOUS | Status: DC
  Filled 2024-02-27: qty 100

## 2024-02-27 MED ORDER — METOPROLOL TARTRATE 50 MG PO TABS
50.0000 mg | ORAL_TABLET | Freq: Two times a day (BID) | ORAL | Status: DC
  Administered 2024-02-27 – 2024-02-29 (×4): 50 mg via ORAL
  Filled 2024-02-27: qty 2
  Filled 2024-02-27 (×2): qty 1
  Filled 2024-02-27: qty 2

## 2024-02-27 NOTE — ED Notes (Signed)
 This RN to bedside to administer amiodarone per order for elevated HR, HR now 102bpm, this RN to hold medication and notify attending

## 2024-02-27 NOTE — Telephone Encounter (Signed)
 Patient Product/process development scientist completed.    The patient is insured through [facility]. Patient has Medicare and is not eligible for a copay card, but may be able to apply for patient assistance or Medicare RX Payment Plan (Patient Must reach out to their plan, if eligible for payment plan), if available.    Ran test claim for Eliquis 5 mg and the current 30 day co-pay is $47.00.   This test claim was processed through [facility]- copay amounts may vary at other pharmacies due to pharmacy/plan contracts, or as the patient moves through the different stages of their insurance plan.     Jesse Norton, CPHT Pharmacy Technician III Certified Patient Advocate [facility] Patient Advocate Team Direct Number: [phone]  Fax: [phone]

## 2024-02-27 NOTE — Telephone Encounter (Signed)
 Wife calling about the pt. He is in ER and he is in Afib and they are awaiting getting a bed in tele. She says that the patien hardly eating or drinking. He  has IV bags for him. She was wondering if he is going to get treatment . I told her not in the ER. I told her that I will let Ascencion Dike about this and she is happy about that. I told Dr, Donneta Romberg about this he says it must be the appt today they are talking about because he is not on chemo tx.

## 2024-02-27 NOTE — Progress Notes (Signed)
 PHARMACY - ANTICOAGULATION CONSULT NOTE  Pharmacy Consult for Heparin Infusion Indication: atrial fibrillation  No Known Allergies  Patient Measurements: Height: 5\' 10"  (177.8 cm) Weight: 106.4 kg (234 lb 9.1 oz) IBW/kg (Calculated) : 73 Heparin Dosing Weight: 95.8 kg  Vital Signs: Temp: 98.2 F (36.8 C) (03/19 1254) Temp Source: Oral (03/19 1254) BP: 122/60 (03/19 1254) Pulse Rate: 93 (03/19 1254)  Labs: Recent Labs    02/26/24 1353 02/27/24 0628  HGB 10.0* 9.1*  HCT 29.4* 26.5*  PLT 314 281  LABPROT 13.9  --   INR 1.0  --   CREATININE 1.15 0.81  TROPONINIHS 19* 16    Estimated Creatinine Clearance: 90.4 mL/min (by C-G formula based on SCr of 0.81 mg/dL).   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   a.) TTE 07/16/2023: 38 mm   Barrett's esophagus    BPH (benign prostatic hyperplasia)    CAD (coronary artery disease) 10/2018   a.) ETT 11/06/2018: 1mm horizontal inflat ST depression, freq PVC's -> intermediate risk; b.) cCTA 12/23/2018: Ca2+ 2408 (93rd %'ile); FFR 0.6 mLAD, 0.87 mLCx, 0.77 dLCx --> cath recommended; c.) LHC/PCI 01/01/2019:  40% pLAD, 85% mLAD, 70% D2, 70% pLCx (2.75 x 15 mm Resolute Onyx DES), RCA min irregs -- staged PCI; d.) staged PCI 01/21/2019: orbital athrectomy + 3.0 x 15 mm Resolute Onyx DES mLAD   Chronic heart failure with preserved ejection fraction (HCC)    Colon polyps    Duodenal adenoma    Gastritis    GERD (gastroesophageal reflux disease)    Gout    H. 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 In the ED, he was found to be in atrial fibrillation with a HR of 150. Pharmacy has been consulted to initiate patient on a heparin infusion for Afib  CHADSVASC (agex2, CAD, HTN, CHF) at least 5   Baseline INR and aPTT ordered.  No signs/symptoms of bleeding noted in chart. Hgb 9.1. PLT 281.  Goal of Therapy:  Heparin level 0.3-0.7 units/ml Monitor platelets by anticoagulation protocol: Yes    Plan:  Give 4800 unit bolus x 1 Start heparin infusion at a rate of 1350 units/hr Check heparin level in 8 hours Monitor CBC daily while on heparin  Merryl Hacker, PharmD Clinical Pharmacist  02/27/2024,1:57 PM

## 2024-02-27 NOTE — Progress Notes (Signed)
  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 " See H&P for full HPI on admission & ED course.  Patient was admitted with Cardiology consulted for further evaluation and management of new onset atrial fibrillation with RVR.  Further hospital course and management as outlined below.   Assessment and Plan:  Atrial fibrillation with RVR - apparently new onset, no known A-fib history. Converted to NSR after IV diltiazem push given in ED. SVT - intermittent --Cardiology following --On heparin drip, continue --Eliquis at discharge  --Telemetry monitoring --Echo pending --Metoprolol 50 mg PO BID  Pneumonia of left lung due to infectious organism --Continue Rocephin & Zithromax --IS & flutter --Supportive care PRN per orders  Elevated troponin, minimal Suspect demand ischemia in think of left lower lobe pneumonia as patient denies chest pain or shortness of breath Hx of CAD s/p stent in 2020 --Holding Plavix --On heparin drip >> Eliquis at discharge  Chronic HFpEF -- euvolemic --Resume home Lasix 20 mg PO daily tomorrow, hold with recurrent tachycardia this afternoon to avoid hypotension  Musculoskeletal back pain - Lumbar x-ray no acute findings, shows chronic degenerative changes, no bony metastatic disease was seen.  No red/flag symptoms including bowel or bladder dysfunction, no radicular symptoms. --Lidocaine patches --Percocet or Tylenol PRM  Essential hypertension --Started on metoprolol --PRN hydralazine       Subjective: Pt seen in ED holding for a bed this AM, wife at bedside. He reports feeling okay, mainly back pain bother  him.  Hospital bed exacerbates back pain, feels little better up in chair.  No chest pain, dyspnea or palpitations.  Has ongoing productive cough.  Physical Exam: Vitals:   02/27/24 0730 02/27/24 0744 02/27/24 0830 02/27/24 1254  BP:   120/78 122/60  Pulse: (!) 104  (!) 102 93  Resp:   (!) 26 (!) 27  Temp:  98.4 F (36.9 C)  98.2 F (36.8 C)  TempSrc:  Oral  Oral  SpO2:   97% 99%  Weight:      Height:       General exam: awake, alert, no acute distress, mildly ill-appearing but nontoxic HEENT: atraumatic, clear conjunctiva, anicteric sclera, moist mucus membranes, hearing grossly normal  Respiratory system: CTAB, no wheezes, rales or rhonchi, normal respiratory effort. Productive sounding cough noted. Cardiovascular system: normal S1/S2, tachycardic, regular rhythm, no edema Gastrointestinal system: soft, NT, ND, no HSM felt, +bowel sounds. Central nervous system: A&O x 4. no gross focal neurologic deficits, normal speech Extremities: moves all, no edema, normal tone Skin: dry, intact, normal temperature Psychiatry: normal mood, congruent affect, judgement and insight appear normal   Data Reviewed:  Notable labs --  Na 133, glucose 102, Ca 8.2, Hbg 9.1  Family Communication: wife at bedside on rounds  Disposition: Status is: Inpatient Remains inpatient appropriate because: ongoing evaluation, HR"s uncontrolled, on IV therapies   Planned Discharge Destination: Home    Time spent: 45 minutes  Author: Pennie Banter, DO 02/27/2024 3:08 PM  For on call review [email or web address].

## 2024-02-27 NOTE — ED Notes (Signed)
 Attending notified of pt BP and HR, awaiting orders

## 2024-02-27 NOTE — Consult Note (Signed)
 Cardiology Consultation   Patient ID: Jesse Norton MRN: [id number]; DOB: 02/13/1946  Admit date: 02/26/2024 Date of Consult: 02/27/2024  PCP:  Marjie Skiff, NP   [facility] Providers Cardiologist:  Yvonne Kendall, MD   {  Patient Profile:   Jesse Norton is a 78 y.o. male with a hx of CAD s/p PCI to the Lcx and LAD in 2020, chronic HFpEF, HTN, Barrett's esophagus,  non-Hodgkin's lymphoma on chemotherapy, diffuse B cell lymphoma, BPH, GERD, anxiety who is being seen 02/27/2024 for the evaluation of Afib RVR at the request of Dr. Denton Lank.  History of Present Illness:   Jesse Norton is followed by Dr. Okey Dupre for the above cardiac issues.   He was last seen 01/2024 and EKG showed nonspecific ST/T wave changes. He denied anginal symptoms. He was given ASA and recommended restart of Plavix. He called into the office 02/21/24 reporting a heart rate in the 130-140s and it was recommended he got to the ER. Suspected this was a reaction to the chemotherapy. Echo 02/2024 showed LVEF 60-65%, no WMA, G1DD, normal RVSF.  The patient presented to the ER 02/26/24 with palpitations. Last week he has chemotherapy and was hypotensive. BP meds were held. He noted elevated HR and called the cardiology office. It was recommended he go to there ER, but he felt HR was not that bad. Yesterday he was in the office and HR was 160 and it was recommended he go to the ER. He did not feel heart racing. He did feel very SOB. Reports low appetite, trouble swallowing and weight loss of 10 lbs.   In the ER BP 127/91, pulse 150bpm, afebrile, RR 18, 100% O2. Labs showed sodium 131, BG 127, Hgb 10. HS trop 19. EKG showed Afib RVR, 154 bpm. BNP 49.9. CXR showed low lung volumes with bronchovascular crowding, aspiration PNA can be considered.  Chest CTA showed no PE, mild interstitial edema pattern. He was started on IV dilt and admitted for further work-up.  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   a.) TTE 07/16/2023: 38 mm   Barrett's esophagus    BPH (benign prostatic hyperplasia)    CAD (coronary artery disease) 10/2018   a.) ETT 11/06/2018: 1mm horizontal inflat ST depression, freq PVC's -> intermediate risk; b.) cCTA 12/23/2018: Ca2+ 2408 (93rd %'ile); FFR 0.6 mLAD, 0.87 mLCx, 0.77 dLCx --> cath recommended; c.) LHC/PCI 01/01/2019:  40% pLAD, 85% mLAD, 70% D2, 70% pLCx (2.75 x 15 mm Resolute Onyx DES), RCA min irregs -- staged PCI; d.) staged PCI 01/21/2019: orbital athrectomy + 3.0 x 15 mm Resolute Onyx DES mLAD   Chronic heart failure with preserved ejection fraction (HCC)    Colon polyps    Duodenal adenoma    Gastritis    GERD (gastroesophageal reflux disease)    Gout    H. pylori infection    H/O urethral stricture    Hemorrhoids    Hiatal hernia    History of bilateral cataract extraction 2023   History of splenectomy 1957   Hypertension    Mucosa-associated lymphoid tissue (MALT) lymphoma    Pre-diabetes    Pulmonary emphysema (HCC)    Stomach ulcer     Past Surgical History:  Procedure Laterality Date   CATARACT  EXTRACTION W/PHACO Left 03/07/2022   Procedure: CATARACT EXTRACTION PHACO AND INTRAOCULAR LENS PLACEMENT (IOC) LEFT VIVITY TORIC LENS 10.41 00:55.7;  Surgeon: Galen Manila, MD;  Location: [facility];  Service: Ophthalmology;  Laterality: Left;   CATARACT EXTRACTION W/PHACO Right 03/28/2022   Procedure: CATARACT EXTRACTION PHACO AND INTRAOCULAR LENS PLACEMENT (IOC) RIGHT VIVITY LENS 6.68 00:50.0;  Surgeon: Galen Manila, MD;  Location: [facility];  Service: Ophthalmology;  Laterality: Right;   COLONOSCOPY     COLONOSCOPY N/A 06/19/2022   Procedure: COLONOSCOPY;  Surgeon: Jaynie Collins, DO;  Location: [facility] ENDOSCOPY;   Service: Gastroenterology;  Laterality: N/A;   COLONOSCOPY WITH PROPOFOL N/A 08/20/2018   Procedure: COLONOSCOPY WITH PROPOFOL;  Surgeon: Christena Deem, MD;  Location: [facility] ENDOSCOPY;  Service: Endoscopy;  Laterality: N/A;   COLONOSCOPY WITH PROPOFOL N/A 12/19/2018   Procedure: COLONOSCOPY WITH PROPOFOL;  Surgeon: Christena Deem, MD;  Location: [facility] ENDOSCOPY;  Service: Endoscopy;  Laterality: N/A;   CORONARY ATHERECTOMY N/A 02/03/2019   Procedure: CORONARY ATHERECTOMY;  Surgeon: Yvonne Kendall, MD;  Location: [facility] INVASIVE CV LAB;  Service: Cardiovascular;  Laterality: N/A;   CORONARY STENT INTERVENTION N/A 01/21/2019   Procedure: CORONARY STENT INTERVENTION;  Surgeon: Yvonne Kendall, MD;  Location: [facility] INVASIVE CV LAB;  Service: Cardiovascular;  Laterality: N/A;   CORONARY STENT INTERVENTION N/A 02/03/2019   Procedure: CORONARY STENT INTERVENTION;  Surgeon: Yvonne Kendall, MD;  Location: [facility] INVASIVE CV LAB;  Service: Cardiovascular;  Laterality: N/A;   CORONARY ULTRASOUND/IVUS N/A 02/03/2019   Procedure: Intravascular Ultrasound/IVUS;  Surgeon: Yvonne Kendall, MD;  Location: [facility] INVASIVE CV LAB;  Service: Cardiovascular;  Laterality: N/A;   CYSTOSCOPY WITH URETHRAL DILATATION N/A 08/16/2018   Procedure: CYSTOSCOPY WITH URETHRAL DILATATION;  Surgeon: Sondra Come, MD;  Location: [facility] ORS;  Service: Urology;  Laterality: N/A;   ESOPHAGOGASTRODUODENOSCOPY N/A 01/29/2017   Procedure: ESOPHAGOGASTRODUODENOSCOPY (EGD);  Surgeon: Christena Deem, MD;  Location: [facility] ENDOSCOPY;  Service: Endoscopy;  Laterality: N/A;   ESOPHAGOGASTRODUODENOSCOPY N/A 06/19/2022   Procedure: ESOPHAGOGASTRODUODENOSCOPY (EGD);  Surgeon: Jaynie Collins, DO;  Location: [facility] ENDOSCOPY;  Service: Gastroenterology;  Laterality: N/A;   ESOPHAGOGASTRODUODENOSCOPY (EGD) WITH PROPOFOL N/A 07/10/2016   Procedure: ESOPHAGOGASTRODUODENOSCOPY (EGD) WITH PROPOFOL;  Surgeon: Christena Deem, MD;  Location:  [facility] ENDOSCOPY;  Service: Endoscopy;  Laterality: N/A;   ESOPHAGOGASTRODUODENOSCOPY (EGD) WITH PROPOFOL N/A 12/19/2018   Procedure: ESOPHAGOGASTRODUODENOSCOPY (EGD) WITH PROPOFOL;  Surgeon: Christena Deem, MD;  Location: [facility] ENDOSCOPY;  Service: Endoscopy;  Laterality: N/A;   left elbow repair Left    LEFT HEART CATH AND CORONARY ANGIOGRAPHY Left 01/21/2019   Procedure: LEFT HEART CATH AND CORONARY ANGIOGRAPHY;  Surgeon: Yvonne Kendall, MD;  Location: [facility] INVASIVE CV LAB;  Service: Cardiovascular;  Laterality: Left;   LYMPH NODE DISSECTION Left 02/08/2024   Procedure: LYMPH NODE DISSECTION;  Surgeon: Carolan Shiver, MD;  Location: [facility] ORS;  Service: General;  Laterality: Left;   PORTACATH PLACEMENT N/A 02/13/2024   Procedure: INSERTION, TUNNELED CENTRAL VENOUS DEVICE, WITH PORT;  Surgeon: Carolan Shiver, MD;  Location: [facility] ORS;  Service: General;  Laterality: N/A;   SPLENECTOMY  1957   TONSILLECTOMY     as a child     Home Medications:  Prior to Admission medications   Medication Sig Start Date End Date Taking? Authorizing Provider  acyclovir (ZOVIRAX) 400 MG tablet Take 1 tablet (400 mg total) by mouth 2 (two) times daily. 02/19/24  Yes Earna Coder, MD  allopurinol (ZYLOPRIM) 300 MG tablet Take 1 tablet (  300 mg total) by mouth 2 (two) times daily. 02/11/24  Yes Earna Coder, MD  atorvastatin (LIPITOR) 40 MG tablet TAKE 1 TABLET(40 MG) BY MOUTH DAILY 01/26/23  Yes Cannady, Jolene T, NP  clopidogrel (PLAVIX) 75 MG tablet TAKE 1 TABLET(75 MG) BY MOUTH DAILY WITH BREAKFAST 02/05/24  Yes Cannady, Jolene T, NP  HYDROcodone-acetaminophen (NORCO/VICODIN) 5-325 MG tablet Take 1 tablet by mouth every 6 (six) hours as needed for up to 10 days. 02/26/24 03/07/24 Yes Cannady, Jolene T, NP  nitroGLYCERIN (NITROSTAT) 0.4 MG SL tablet Place 1 tablet (0.4 mg total) under the tongue every 5 (five) minutes as needed for chest pain. Maximum of 3 doses. 08/25/20  Yes End,  Cristal Deer, MD  ondansetron (ZOFRAN) 8 MG tablet One pill every 8 hours as needed for nausea/vomitting. 02/11/24  Yes Earna Coder, MD  prochlorperazine (COMPAZINE) 10 MG tablet Take 1 tablet (10 mg total) by mouth every 6 (six) hours as needed for nausea or vomiting. 02/11/24  Yes Earna Coder, MD  Blood Glucose Monitoring Suppl (ONETOUCH VERIO) w/Device KIT To check blood sugar twice a day with goal fasting in morning <130 and goal 2 hours after meal <180.  Write all checks down for provider visits. 02/11/24   Cannady, Corrie Dandy T, NP  clobetasol ointment (TEMOVATE) 0.05 % Apply topically as needed. 01/20/22   [provider]  FLUoxetine (PROZAC) 10 MG tablet Take 1 tablet (10 mg total) by mouth daily. 02/26/24   Cannady, Corrie Dandy T, NP  glucose blood test strip To check blood sugar twice a day with goal fasting in morning <130 and goal 2 hours after meal <180.  Write all checks down for provider visits. 02/11/24   Cannady, Corrie Dandy T, NP  Lancets (ONETOUCH ULTRASOFT) lancets 1 each by Other route 2 (two) times daily. 02/13/24   Cannady, Corrie Dandy T, NP  lidocaine (LIDODERM) 5 % Place 1 patch onto the skin daily. Remove & Discard patch within 12 hours or as directed by MD 02/26/24   Aura Dials T, NP  lidocaine-prilocaine (EMLA) cream Apply on the port. 30 -45 min  prior to port access. 02/11/24   Earna Coder, MD  losartan (COZAAR) 100 MG tablet TAKE 1 TABLET(100 MG) BY MOUTH DAILY Patient not taking: Reported on 02/26/2024 01/26/23   Aura Dials T, NP  pantoprazole (PROTONIX) 20 MG tablet TAKE 1 TABLET(20 MG) BY MOUTH TWICE DAILY Patient not taking: Reported on 02/26/2024 03/14/23   Aura Dials T, NP  predniSONE (DELTASONE) 50 MG tablet Take 2 tablets once day x 5 days.  Take in AM; with FOOD. Patient not taking: Reported on 02/26/2024 02/19/24   Earna Coder, MD    Inpatient Medications: Scheduled Meds:  acyclovir  400 mg Oral BID   allopurinol  300 mg Oral BID    atorvastatin  40 mg Oral Daily   clopidogrel  75 mg Oral Daily   heparin  5,000 Units Subcutaneous Q8H   lidocaine  1-2 patch Transdermal Q24H   metoprolol tartrate  12.5 mg Oral BID   Continuous Infusions:  sodium chloride 125 mL/hr at 02/27/24 0454   azithromycin Stopped (02/27/24 0539)   cefTRIAXone (ROCEPHIN)  IV Stopped (02/27/24 0538)   PRN Meds: acetaminophen **OR** acetaminophen, hydrALAZINE, HYDROcodone-acetaminophen, metoprolol tartrate, morphine injection, nitroGLYCERIN, ondansetron **OR** ondansetron (ZOFRAN) IV, senna-docusate  Allergies:   No Known Allergies  Social History:   Social History   Socioeconomic History   Marital status: Married    Spouse name: Marylu Lund  Number of children: 2   Years of education: Not on file   Highest education level: Master's degree (e.g., MA, MS, MEng, MEd, MSW, MBA)  Occupational History   Occupation: retired     Comment: Psychologist, occupational  Tobacco Use   Smoking status: Never   Smokeless tobacco: Never  Vaping Use   Vaping status: Never Used  Substance and Sexual Activity   Alcohol use: Not Currently   Drug use: No   Sexual activity: Yes    Partners: Female  Other Topics Concern   Not on file  Social History Narrative   Married, 2 sons, 1 in [place], [place] and 1 in [employer], stationary bike      No smoking.  Occasional alcohol.  Lives at home.  Retired Psychologist, occupational.   Social Drivers of Corporate investment banker Strain: Low Risk  (01/29/2024)   Received from [facility]   Overall Financial Resource Strain (CARDIA)    Difficulty of Paying Living Expenses: Not hard at all  Food Insecurity: No Food Insecurity (01/29/2024)   Received from [facility]   Hunger Vital Sign    Worried About Running Out of Food in the Last Year: Never true    Ran Out of Food in the Last Year: Never true  Transportation Needs: No Transportation Needs (01/29/2024)   Received from [facility] - Transportation    In the past 12 months, has lack of transportation kept you from medical appointments or from getting medications?: No    Lack of Transportation (Non-Medical): No  Physical Activity: Insufficiently Active (07/17/2023)   Exercise Vital Sign    Days of Exercise per Week: 4 days    Minutes of Exercise per Session: 30 min  Stress: No Stress Concern Present (07/17/2023)   [employer] of Occupational Health - Occupational Stress Questionnaire    Feeling of Stress : Only a little  Social Connections: Moderately Integrated (07/17/2023)   Social Connection and Isolation Panel [NHANES]    Frequency of Communication with Friends and Family: Three times a week    Frequency of Social Gatherings with Friends and Family: Three times a week    Attends Religious Services: More than 4 times per year    Active Member of Clubs or Organizations: No    Attends Banker Meetings: Never    Marital Status: Married  Catering manager Violence: Not At Risk (07/17/2023)   Humiliation, Afraid, Rape, and Kick questionnaire    Fear of Current or Ex-Partner: No    Emotionally Abused: No    Physically Abused: No    Sexually Abused: No    Family History:    Family History  Adopted: Yes  Problem Relation Age of Onset   Heart attack Mother 69   Pancreatic cancer Father        died in WWII - pt adopted   Hypertension Sister    Hyperlipidemia Sister    Hypertension Brother    Hyperlipidemia Brother    Prostate cancer Neg Hx    Kidney cancer Neg Hx    Bladder Cancer Neg Hx      ROS:  Please see the history of present illness.   All other ROS reviewed and negative.     Physical Exam/Data:   Vitals:   02/27/24 0728 02/27/24 0729 02/27/24 0730 02/27/24 0744  BP: 127/84     Pulse: (!) 106  (!) 104   Resp: Marland Kitchen)  26 (!) 23    Temp:    98.4 F (36.9 C)  TempSrc:    Oral  SpO2: 100%     Weight:      Height:        Intake/Output Summary (Last 24 hours) at 02/27/2024  0833 Last data filed at 02/27/2024 0016 Gross per 24 hour  Intake 120 ml  Output 1 ml  Net 119 ml      02/26/2024    1:39 PM 02/26/2024   11:10 AM 02/19/2024    9:44 AM  Last 3 Weights  Weight (lbs) 234 lb 9.1 oz 234 lb 9.6 oz 235 lb 1.6 oz  Weight (kg) 106.4 kg 106.414 kg 106.641 kg     Body mass index is 33.66 kg/m.  General:  Well nourished, well developed, in no acute distress HEENT: normal Neck: no JVD Vascular: No carotid bruits; Distal pulses 2+ bilaterally Cardiac:  normal S1, S2; RRR; no murmur  Lungs:  clear to auscultation bilaterally, no wheezing, rhonchi or rales  Abd: soft, nontender, no hepatomegaly  Ext: no edema Musculoskeletal:  No deformities, BUE and BLE strength normal and equal Skin: warm and dry  Neuro:  CNs 2-12 intact, no focal abnormalities noted Psych:  Normal affect   EKG:  The EKG was personally reviewed and demonstrates:  Afib 154bpm, nonspecific t wave changes Telemetry:  Telemetry was personally reviewed and demonstrates:  NS/St HR around 100, PACs  Relevant CV Studies:  Echo 02/2024  1. Left ventricular ejection fraction, by estimation, is 60 to 65%. The  left ventricle has normal function. The left ventricle has no regional  wall motion abnormalities. Left ventricular diastolic parameters are  consistent with Grade I diastolic  dysfunction (impaired relaxation).   2. Right ventricular systolic function is normal. The right ventricular  size is normal. There is normal pulmonary artery systolic pressure. The  estimated right ventricular systolic pressure is 21.8 mmHg.   3. Left atrial size was mildly dilated.   4. The mitral valve is normal in structure. No evidence of mitral valve  regurgitation. No evidence of mitral stenosis.   5. The aortic valve is normal in structure. There is mild calcification  of the aortic valve. Aortic valve regurgitation is not visualized. Aortic  valve sclerosis is present, with no evidence of aortic valve  stenosis.   6. The inferior vena cava is normal in size with greater than 50%  respiratory variability, suggesting right atrial pressure of 3 mmHg.   Echo 07/2023  1. Left ventricular ejection fraction, by estimation, is 60 to 65%. The  left ventricle has normal function. The left ventricle has no regional  wall motion abnormalities. Left ventricular diastolic parameters were  normal.   2. Right ventricular systolic function is normal. The right ventricular  size is normal.   3. The mitral valve is normal in structure. Mild mitral valve  regurgitation.   4. The aortic valve was not well visualized. Aortic valve regurgitation  is not visualized.   5. Aortic dilatation noted. There is mild dilatation of the ascending  aorta, measuring 38 mm.   Coronary atherectomy 2020 Conclusions: Severe, heavily calcified mid LAD stenosis (80-90%), unchanged from diagnostic catheterization earlier this month. Widely patent LCx stent. Normal left ventricular filling pressure. Successful orbital atherectomy and IVUS-guided PCI to the mid LAD using a Resolute Onyx 3.0 x 15 mm drug-eluting stent (postdilated to 3.6 mm) with 0% residual stenosis and TIMI-3 flow.   Recommendations: Overnight extended recovery. Continue dual  antiplatelet therapy with aspirin and clopidogrel for at least 6 months. Aggressive secondary prevention.   Yvonne Kendall, MD [facility] Pager: [phone]     Laboratory Data:  High Sensitivity Troponin:   Recent Labs  Lab 02/26/24 1353 02/27/24 0628  TROPONINIHS 19* 16     Chemistry Recent Labs  Lab 02/26/24 1353 02/27/24 0628  NA 131* 133*  K 4.3 3.9  CL 96* 99  CO2 23 26  GLUCOSE 127* 102*  BUN 26* 17  CREATININE 1.15 0.81  CALCIUM 9.0 8.2*  GFRNONAA >60 >60  ANIONGAP 12 8    No results for input(s): "PROT", "ALBUMIN", "AST", "ALT", "ALKPHOS", "BILITOT" in the last 168 hours. Lipids No results for input(s): "CHOL", "TRIG", "HDL", "LABVLDL",  "LDLCALC", "CHOLHDL" in the last 168 hours.  Hematology Recent Labs  Lab 02/26/24 1353 02/27/24 0628  WBC 7.8 6.8  RBC 2.81* 2.52*  HGB 10.0* 9.1*  HCT 29.4* 26.5*  MCV 104.6* 105.2*  MCH 35.6* 36.1*  MCHC 34.0 34.3  RDW 14.5 14.8  PLT 314 281   Thyroid No results for input(s): "TSH", "FREET4" in the last 168 hours.  BNP Recent Labs  Lab 02/26/24 1354  BNP 49.9    DDimer No results for input(s): "DDIMER" in the last 168 hours.   Radiology/Studies:  DG Lumbar Spine 2-3 Views Result Date: 02/26/2024 CLINICAL DATA:  Back pain EXAM: LUMBAR SPINE - 2-3 VIEW COMPARISON:  CT 01/31/2024 FINDINGS: Degenerative disc disease most pronounced at L5-S1 with disc space narrowing, spurring, and vacuum disc. Diffuse degenerative facet disease, most pronounced in the lower lumbar spine. Normal alignment. No fracture. IMPRESSION: Degenerative disc and facet disease.  No acute bony abnormality. Electronically Signed   By: Charlett Nose M.D.   On: 02/26/2024 23:27   CT Angio Chest PE W/Cm &/Or Wo Cm Result Date: 02/26/2024 CLINICAL DATA:  PE suspected. High probability. * Tracking Code: BO * EXAM: CT ANGIOGRAPHY CHEST WITH CONTRAST TECHNIQUE: Multidetector CT imaging of the chest was performed using the standard protocol during bolus administration of intravenous contrast. Multiplanar CT image reconstructions and MIPs were obtained to evaluate the vascular anatomy. RADIATION DOSE REDUCTION: This exam was performed according to the departmental dose-optimization program which includes automated exposure control, adjustment of the mA and/or kV according to patient size and/or use of iterative reconstruction technique. CONTRAST:  75mL OMNIPAQUE IOHEXOL 350 MG/ML SOLN COMPARISON:  PET-CT 02/05/2024 FINDINGS: Cardiovascular: No filling defects within the pulmonary arteries to suggest acute pulmonary embolism. Mediastinum/Nodes: Bulky mediastinum and LEFT axillary lymphadenopathy. RIGHT lower paratracheal node  measures 3.4 cm short axis compared to 3.1 cm on PET-CT. LEFT supraclavicular node measures 3.2 cm compared to 2.3 cm. LEFT axillary nodal mass with bulky nodes. Individual node measures up to 3.4 cm compared to 2.3 cm. Node beneath the pectoralis muscle measures 2.7 cm compared to 2.0 cm. Visually nodes appear larger. Lungs/Pleura: Interstitial edema pattern. no  Infarction.  No pneumonia. 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 Multiple small spleen spanning diaphragm. Adrenal glands normal. Musculoskeletal: No aggressive osseous lesion. Review of the MIP images confirms the above findings. IMPRESSION: 1. No acute pulmonary embolism. 2. No acute pulmonary parenchymal findings. 3. mild interstitial edema pattern. 4. Bulky mediastinal, supraclavicular and LEFT axillary adenopathy. The bulky LEFT axillary adenopathy is increased in size. Electronically Signed   By: Genevive Bi M.D.   On: 02/26/2024 17:07   DG Chest Port 1 View Result Date:  02/26/2024 CLINICAL DATA:  AFib with RVR EXAM: PORTABLE CHEST 1 VIEW COMPARISON:  Chest radiograph dated 02/13/2024 FINDINGS: Lines/tubes: Right chest wall port tip projects over the SVC. Lungs: Unchanged asymmetric elevation of the right hemidiaphragm. Low lung volumes with bronchovascular crowding. Hazy and patchy left lower lung opacity. Pleura: No pneumothorax or pleural effusion. Heart/mediastinum: Similar mildly enlarged cardiomediastinal silhouette. Bones: No acute osseous abnormality. IMPRESSION: Low lung volumes with bronchovascular crowding. Hazy and patchy left lower lung opacity, likely atelectasis. Aspiration or pneumonia can be considered in the appropriate clinical setting. Electronically Signed   By: Agustin Cree M.D.   On: 02/26/2024 16:30     Assessment and Plan:   New onset Afib - presented with palpitations found to have new onset Afib - last week he had chemo and called in  the office to report elevated HR and ER eval was recommended - he was given IV dilt push and converted to NSR. - PTA metoprolol was previously held for hypotension during chemo - restarted on metoprolol 12.5mg  BID - remains in NSR/ST with PACs - increase metoprolol 50mg  BID - CHADSVASC (agex2, CAD, HTN, CHF) at least 5. He would qualify for long-term a/c with Eliquis  Elevated troponin CAD with prior stenting in 2020 - HS trop 19>16 - no chest pain reported - minimally elevated troponin in the setting of rapid Afib - suspect supply demand mismatch - hold Plavix with anticipation of DOAC  Chronic HFpEF - mild edema on CXR - BNP normal - we will given IV lasix 40mg  once - PTA lasix previously held for hypotension  Left lower lobe PNA - abx per IM  HTN - metoprolol 50mg  BID - other PTA meds held  For questions or updates, please contact [facility] Please consult [email or web address] for contact info under    Signed, Lindzee Gouge David Stall, PA-C  02/27/2024 8:33 AM

## 2024-02-28 ENCOUNTER — Encounter: Payer: Self-pay | Admitting: Internal Medicine

## 2024-02-28 DIAGNOSIS — R7989 Other specified abnormal findings of blood chemistry: Secondary | ICD-10-CM | POA: Diagnosis not present

## 2024-02-28 DIAGNOSIS — I10 Essential (primary) hypertension: Secondary | ICD-10-CM | POA: Diagnosis not present

## 2024-02-28 DIAGNOSIS — I5032 Chronic diastolic (congestive) heart failure: Secondary | ICD-10-CM | POA: Diagnosis not present

## 2024-02-28 DIAGNOSIS — I471 Supraventricular tachycardia, unspecified: Secondary | ICD-10-CM

## 2024-02-28 DIAGNOSIS — I4891 Unspecified atrial fibrillation: Secondary | ICD-10-CM | POA: Diagnosis not present

## 2024-02-28 LAB — CBC
HCT: 26.4 % — ABNORMAL LOW (ref 39.0–52.0)
Hemoglobin: 8.6 g/dL — ABNORMAL LOW (ref 13.0–17.0)
MCH: 34.5 pg — ABNORMAL HIGH (ref 26.0–34.0)
MCHC: 32.6 g/dL (ref 30.0–36.0)
MCV: 106 fL — ABNORMAL HIGH (ref 80.0–100.0)
Platelets: 299 10*3/uL (ref 150–400)
RBC: 2.49 MIL/uL — ABNORMAL LOW (ref 4.22–5.81)
RDW: 14.5 % (ref 11.5–15.5)
WBC: 7.6 10*3/uL (ref 4.0–10.5)
nRBC: 0.7 % — ABNORMAL HIGH (ref 0.0–0.2)

## 2024-02-28 LAB — HEPARIN LEVEL (UNFRACTIONATED)
Heparin Unfractionated: 0.72 [IU]/mL — ABNORMAL HIGH (ref 0.30–0.70)
Heparin Unfractionated: 0.57 [IU]/mL (ref 0.30–0.70)
Heparin Unfractionated: 0.64 [IU]/mL (ref 0.30–0.70)

## 2024-02-28 LAB — MRSA NEXT GEN BY PCR, NASAL: MRSA by PCR Next Gen: NOT DETECTED

## 2024-02-28 MED ORDER — FUROSEMIDE 20 MG PO TABS
20.0000 mg | ORAL_TABLET | Freq: Every day | ORAL | Status: DC
  Administered 2024-02-28 – 2024-02-29 (×2): 20 mg via ORAL
  Filled 2024-02-28 (×2): qty 1

## 2024-02-28 NOTE — Progress Notes (Signed)
 Rounding Note    Patient Name: Jesse Norton Date of Encounter: 02/28/2024  [facility] Cardiologist: Yvonne Kendall, MD   Subjective   Reports sleeping well last night Denies significant shortness of breath Telemetry reviewed, details discussed with him No further atrial fibrillation Run of SVT yesterday afternoon 2:45 until 4:05 PM, 180 bpm Short run SVT 8 PM Asymptomatic for both episodes Started on amiodarone 400 mg twice daily yesterday for SVT, Also on metoprolol 50 twice daily  Chronic back pain, slept better with 2 doses of morphine IV  Inpatient Medications    Scheduled Meds:  acyclovir  400 mg Oral BID   allopurinol  300 mg Oral BID   amiodarone  400 mg Oral BID   atorvastatin  40 mg Oral Daily   lidocaine  1-2 patch Transdermal Q24H   metoprolol tartrate  50 mg Oral BID   Continuous Infusions:  amiodarone Stopped (02/27/24 1650)   azithromycin Stopped (02/27/24 1858)   cefTRIAXone (ROCEPHIN)  IV Stopped (02/27/24 1749)   heparin 1,250 Units/hr (02/28/24 0655)   PRN Meds: acetaminophen **OR** acetaminophen, hydrALAZINE, HYDROcodone-acetaminophen, metoprolol tartrate, metoprolol tartrate, morphine injection, nitroGLYCERIN, ondansetron **OR** ondansetron (ZOFRAN) IV, oxyCODONE, senna-docusate   Vital Signs    Vitals:   02/28/24 0800 02/28/24 0818 02/28/24 0900 02/28/24 1000  BP: 139/73  134/66 (!) 125/114  Pulse: 90  94 91  Resp: (!) 22  18 20   Temp:  98.1 F (36.7 C)    TempSrc:  Oral    SpO2: 94%  97% 93%  Weight:      Height:        Intake/Output Summary (Last 24 hours) at 02/28/2024 1231 Last data filed at 02/28/2024 0655 Gross per 24 hour  Intake 2199.26 ml  Output 500 ml  Net 1699.26 ml      02/26/2024    1:39 PM 02/26/2024   11:10 AM 02/19/2024    9:44 AM  Last 3 Weights  Weight (lbs) 234 lb 9.1 oz 234 lb 9.6 oz 235 lb 1.6 oz  Weight (kg) 106.4 kg 106.414 kg 106.641 kg      Telemetry    Normal sinus rhythm Run of  SVT hour 20 minutes starting 2:45 PM Additional episode of SVT less than 10 minutes at 8 PM- Personally Reviewed  ECG     - Personally Reviewed  Physical Exam   GEN: No acute distress.  Obese Neck: No JVD Cardiac: RRR, no murmurs, rubs, or gallops.  Respiratory: Clear to auscultation bilaterally. GI: Soft, nontender, non-distended  MS: No edema; No deformity. Neuro:  Nonfocal  Psych: Normal affect   Labs    High Sensitivity Troponin:   Recent Labs  Lab 02/26/24 1353 02/27/24 0628  TROPONINIHS 19* 16     Chemistry Recent Labs  Lab 02/26/24 1353 02/27/24 0628  NA 131* 133*  K 4.3 3.9  CL 96* 99  CO2 23 26  GLUCOSE 127* 102*  BUN 26* 17  CREATININE 1.15 0.81  CALCIUM 9.0 8.2*  GFRNONAA >60 >60  ANIONGAP 12 8    Lipids No results for input(s): "CHOL", "TRIG", "HDL", "LABVLDL", "LDLCALC", "CHOLHDL" in the last 168 hours.  Hematology Recent Labs  Lab 02/26/24 1353 02/27/24 0628 02/28/24 0726  WBC 7.8 6.8 7.6  RBC 2.81* 2.52* 2.49*  HGB 10.0* 9.1* 8.6*  HCT 29.4* 26.5* 26.4*  MCV 104.6* 105.2* 106.0*  MCH 35.6* 36.1* 34.5*  MCHC 34.0 34.3 32.6  RDW 14.5 14.8 14.5  PLT 314 281 299  Thyroid No results for input(s): "TSH", "FREET4" in the last 168 hours.  BNP Recent Labs  Lab 02/26/24 1354  BNP 49.9    DDimer No results for input(s): "DDIMER" in the last 168 hours.   Radiology    US Venous Img Upper Uni Left (DVT) Result Date: 02/27/2024 CLINICAL DATA:  Swelling.  History of lymphoma EXAM: Left UPPER EXTREMITY VENOUS DOPPLER ULTRASOUND TECHNIQUE: Gray-scale sonography with graded compression, as well as color Doppler and duplex ultrasound were performed to evaluate the upper extremity deep venous system from the level of the subclavian vein and including the jugular, axillary, basilic, radial, ulnar and upper cephalic vein. Spectral Doppler was utilized to evaluate flow at rest and with distal augmentation maneuvers. COMPARISON:  None Available.  FINDINGS: Contralateral Subclavian Vein: Respiratory phasicity is normal and symmetric with the symptomatic side. No evidence of thrombus. Normal compressibility. Internal Jugular Vein: No evidence of thrombus. Normal compressibility, respiratory phasicity and response to augmentation. Subclavian Vein: No evidence of thrombus. Normal compressibility, respiratory phasicity and response to augmentation. Axillary Vein: No evidence of thrombus. Normal compressibility, respiratory phasicity and response to augmentation. Cephalic Vein: No evidence of thrombus. Normal compressibility, respiratory phasicity and response to augmentation. Basilic Vein: No evidence of thrombus. Normal compressibility, respiratory phasicity and response to augmentation. Brachial Veins: No evidence of thrombus. Normal compressibility, respiratory phasicity and response to augmentation. Radial Veins: No evidence of thrombus. Normal compressibility, respiratory phasicity and response to augmentation. Ulnar Veins: No evidence of thrombus. Normal compressibility, respiratory phasicity and response to augmentation. Venous Reflux:  None visualized. Other Findings: Note is made of numerous abnormal nodes identified in the visualized neck. Please correlate with prior workup. Scattered soft tissue edema as well along the forearm. IMPRESSION: No evidence of DVT within the left upper extremity. Electronically Signed   By: Karen Kays M.D.   On: 02/27/2024 14:52   DG Lumbar Spine 2-3 Views Result Date: 02/26/2024 CLINICAL DATA:  Back pain EXAM: LUMBAR SPINE - 2-3 VIEW COMPARISON:  CT 01/31/2024 FINDINGS: Degenerative disc disease most pronounced at L5-S1 with disc space narrowing, spurring, and vacuum disc. Diffuse degenerative facet disease, most pronounced in the lower lumbar spine. Normal alignment. No fracture. IMPRESSION: Degenerative disc and facet disease.  No acute bony abnormality. Electronically Signed   By: Charlett Nose M.D.   On: 02/26/2024  23:27   CT Angio Chest PE W/Cm &/Or Wo Cm Result Date: 02/26/2024 CLINICAL DATA:  PE suspected. High probability. * Tracking Code: BO * EXAM: CT ANGIOGRAPHY CHEST WITH CONTRAST TECHNIQUE: Multidetector CT imaging of the chest was performed using the standard protocol during bolus administration of intravenous contrast. Multiplanar CT image reconstructions and MIPs were obtained to evaluate the vascular anatomy. RADIATION DOSE REDUCTION: This exam was performed according to the departmental dose-optimization program which includes automated exposure control, adjustment of the mA and/or kV according to patient size and/or use of iterative reconstruction technique. CONTRAST:  75mL OMNIPAQUE IOHEXOL 350 MG/ML SOLN COMPARISON:  PET-CT 02/05/2024 FINDINGS: Cardiovascular: No filling defects within the pulmonary arteries to suggest acute pulmonary embolism. Mediastinum/Nodes: Bulky mediastinum and LEFT axillary lymphadenopathy. RIGHT lower paratracheal node measures 3.4 cm short axis compared to 3.1 cm on PET-CT. LEFT supraclavicular node measures 3.2 cm compared to 2.3 cm. LEFT axillary nodal mass with bulky nodes. Individual node measures up to 3.4 cm compared to 2.3 cm. Node beneath the pectoralis muscle measures 2.7 cm compared to 2.0 cm. Visually nodes appear larger. Lungs/Pleura: Interstitial edema pattern. no  Infarction.  No  pneumonia. 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 Multiple small spleen spanning diaphragm. Adrenal glands normal. Musculoskeletal: No aggressive osseous lesion. Review of the MIP images confirms the above findings. IMPRESSION: 1. No acute pulmonary embolism. 2. No acute pulmonary parenchymal findings. 3. mild interstitial edema pattern. 4. Bulky mediastinal, supraclavicular and LEFT axillary adenopathy. The bulky LEFT axillary adenopathy is increased in size. Electronically Signed   By: Genevive Bi M.D.   On:  02/26/2024 17:07   DG Chest Port 1 View Result Date: 02/26/2024 CLINICAL DATA:  AFib with RVR EXAM: PORTABLE CHEST 1 VIEW COMPARISON:  Chest radiograph dated 02/13/2024 FINDINGS: Lines/tubes: Right chest wall port tip projects over the SVC. Lungs: Unchanged asymmetric elevation of the right hemidiaphragm. Low lung volumes with bronchovascular crowding. Hazy and patchy left lower lung opacity. Pleura: No pneumothorax or pleural effusion. Heart/mediastinum: Similar mildly enlarged cardiomediastinal silhouette. Bones: No acute osseous abnormality. IMPRESSION: Low lung volumes with bronchovascular crowding. Hazy and patchy left lower lung opacity, likely atelectasis. Aspiration or pneumonia can be considered in the appropriate clinical setting. Electronically Signed   By: Agustin Cree M.D.   On: 02/26/2024 16:30    Cardiac Studies   Echo February 12, 2024 1. Left ventricular ejection fraction, by estimation, is 60 to 65%. The  left ventricle has normal function. The left ventricle has no regional  wall motion abnormalities. Left ventricular diastolic parameters are  consistent with Grade I diastolic  dysfunction (impaired relaxation).   2. Right ventricular systolic function is normal. The right ventricular  size is normal. There is normal pulmonary artery systolic pressure. The  estimated right ventricular systolic pressure is 21.8 mmHg.   3. Left atrial size was mildly dilated.   4. The mitral valve is normal in structure. No evidence of mitral valve  regurgitation. No evidence of mitral stenosis.   5. The aortic valve is normal in structure. There is mild calcification  of the aortic valve. Aortic valve regurgitation is not visualized. Aortic  valve sclerosis is present, with no evidence of aortic valve stenosis.   6. The inferior vena cava is normal in size with greater than 50%  respiratory variability, suggesting right atrial pressure of 3 mmHg.    Patient Profile     Mr. 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, diffuse B-cell lymphoma, presenting with tachycardia, palpitations /atrial ablation, SVT  Assessment & Plan    Atrial fibrillation with RVR Noted on EKG on arrival rate up to 160 bpm Converting with diltiazem IV push - metoprolol  tartrate dose increased, tolerating 50 mg twice daily -Also started amiodarone 400 twice daily load given paroxysmal SVT with rate in the 180s -Heparin infusion with plan to transition to Eliquis 5 twice daily at discharge given CHA2DS2-VASc at least 5  SVT Numerous runs up to 180 bpm, longest episode so far 1 hour 20 minutes yesterday afternoon starting 2:45 PM -Started on amiodarone 400 twice daily 7 days then down to 200 twice daily   Minimally elevated troponin Known history of coronary artery disease prior stenting 2020 Troponin remains low -Likely supply/demand mismatch in the setting of tachycardia as above Denies chest pain concerning for angina Plavix on hold in place of Eliquis   Diastolics CHF/pulmonary edema Edema on chest x-ray -Given dose IV Lasix 40 yesterday -Consider restarting Lasix 20  daily, home dose -No BMP today.   Essential hypertension gentleman Metoprolol tartrate 50 twice daily,  Lasix 20 daily amlodipine losartan on hold given blood pressure well-controlled   Diffuse large B-cell lymphoma Positive biopsy Receiving infusion Chronic low back pain, poor sleep, likely from lymphadenopathy Receiving morphine IV   [facility] will sign off.   Medication Recommendations: Amiodarone 400 twice daily 7 days then down to 200 twice daily, continue metoprolol tartrate 50 twice daily, 5 twice daily at discharge Other recommendations (labs, testing, etc): Further testing needed Follow up as an outpatient: Outpatient follow-up with Dr. Okey Dupre   For  questions or updates, please contact [facility] Please consult [email or web address] for contact info under        Signed, Julien Nordmann, MD  02/28/2024, 12:31 PM

## 2024-02-28 NOTE — Progress Notes (Signed)
 PHARMACY - ANTICOAGULATION CONSULT NOTE  Pharmacy Consult for Heparin Infusion Indication: atrial fibrillation  No Known Allergies  Patient Measurements: Height: 5\' 10"  (177.8 cm) Weight: 106.4 kg (234 lb 9.1 oz) IBW/kg (Calculated) : 73 Heparin Dosing Weight: 95.8 kg  Vital Signs: Temp: 98.5 F (36.9 C) (03/20 0030) Temp Source: Oral (03/20 0030) BP: 123/61 (03/20 0145) Pulse Rate: 88 (03/20 0145)  Labs: Recent Labs    02/26/24 1353 02/27/24 0628 02/27/24 1606 02/28/24 0146  HGB 10.0* 9.1*  --   --   HCT 29.4* 26.5*  --   --   PLT 314 281  --   --   APTT  --   --  113*  --   LABPROT 13.9  --  16.4*  --   INR 1.0  --  1.3*  --   HEPARINUNFRC  --   --   --  0.72*  CREATININE 1.15 0.81  --   --   TROPONINIHS 19* 16  --   --     Estimated Creatinine Clearance: 90.4 mL/min (by C-G formula based on SCr of 0.81 mg/dL).   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   a.) TTE 07/16/2023: 38 mm   Barrett's esophagus    BPH (benign prostatic hyperplasia)    CAD (coronary artery disease) 10/2018   a.) ETT 11/06/2018: 1mm horizontal inflat ST depression, freq PVC's -> intermediate risk; b.) cCTA 12/23/2018: Ca2+ 2408 (93rd %'ile); FFR 0.6 mLAD, 0.87 mLCx, 0.77 dLCx --> cath recommended; c.) LHC/PCI 01/01/2019:  40% pLAD, 85% mLAD, 70% D2, 70% pLCx (2.75 x 15 mm Resolute Onyx DES), RCA min irregs -- staged PCI; d.) staged PCI 01/21/2019: orbital athrectomy + 3.0 x 15 mm Resolute Onyx DES mLAD   Chronic heart failure with preserved ejection fraction (HCC)    Colon polyps    Duodenal adenoma    Gastritis    GERD (gastroesophageal reflux disease)    Gout    H. pylori infection    H/O urethral stricture    Hemorrhoids    Hiatal hernia    History of  bilateral cataract extraction 2023   History of splenectomy 1957   Hypertension    Mucosa-associated lymphoid tissue (MALT) lymphoma    Pre-diabetes    Pulmonary emphysema (HCC)    Stomach ulcer     Medications:  Not on anticoagulation at home per chart review  Assessment: Patient is a 78 year old male with a past medical history of coronary artery disease, prior PCI to the left circumflex and LAD 2020, diastolic CHF, hypertension, non-Hodgkin's lymphoma on chemotherapy, and diffuse B-cell lymphoma who presented to ED with tachycardia and palpitations. In the ED, he was found to be in atrial fibrillation with a HR of 150. Pharmacy has been consulted to initiate patient on a heparin infusion for Afib  CHADSVASC (agex2, CAD, HTN, CHF) at least 5   Baseline INR and aPTT ordered.  No signs/symptoms of bleeding noted in chart. Hgb 9.1. PLT 281.  Goal of Therapy:  Heparin level 0.3-0.7 units/ml Monitor platelets by anticoagulation protocol: Yes   03/20 0146 HL 0.72, supratherapeutic  Plan:  Decrease heparin infusion rate to 1250 units/hr Recheck HL in 8 hrs after rate change Monitor CBC daily while on heparin  Lendon Collar.  Rosalyn Gess, PharmD, MBA 02/28/2024 2:11 AM

## 2024-02-28 NOTE — Progress Notes (Signed)
 PHARMACY - ANTICOAGULATION CONSULT NOTE  Pharmacy Consult for Heparin Infusion Indication: atrial fibrillation  No Known Allergies  Patient Measurements: Height: 5\' 10"  (177.8 cm) Weight: 106.4 kg (234 lb 9.1 oz) IBW/kg (Calculated) : 73 Heparin Dosing Weight: 95.8 kg  Vital Signs: Temp: 98.3 F (36.8 C) (03/20 1230) Temp Source: Oral (03/20 0818) BP: 117/62 (03/20 1230) Pulse Rate: 89 (03/20 1230)  Labs: Recent Labs    02/26/24 1353 02/27/24 0628 02/27/24 1606 02/28/24 0146 02/28/24 0726 02/28/24 1248  HGB 10.0* 9.1*  --   --  8.6*  --   HCT 29.4* 26.5*  --   --  26.4*  --   PLT 314 281  --   --  299  --   APTT  --   --  113*  --   --   --   LABPROT 13.9  --  16.4*  --   --   --   INR 1.0  --  1.3*  --   --   --   HEPARINUNFRC  --   --   --  0.72*  --  0.57  CREATININE 1.15 0.81  --   --   --   --   TROPONINIHS 19* 16  --   --   --   --     Estimated Creatinine Clearance: 90.4 mL/min (by C-G formula based on SCr of 0.81 mg/dL).   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   a.) TTE 07/16/2023: 38 mm   Barrett's esophagus    BPH (benign prostatic hyperplasia)    CAD (coronary artery disease) 10/2018   a.) ETT 11/06/2018: 1mm horizontal inflat ST depression, freq PVC's -> intermediate risk; b.) cCTA 12/23/2018: Ca2+ 2408 (93rd %'ile); FFR 0.6 mLAD, 0.87 mLCx, 0.77 dLCx --> cath recommended; c.) LHC/PCI 01/01/2019:  40% pLAD, 85% mLAD, 70% D2, 70% pLCx (2.75 x 15 mm Resolute Onyx DES), RCA min irregs -- staged PCI; d.) staged PCI 01/21/2019: orbital athrectomy + 3.0 x 15 mm Resolute Onyx DES mLAD   Chronic heart failure with preserved ejection fraction (HCC)    Colon polyps    Duodenal adenoma    Gastritis    GERD (gastroesophageal reflux  disease)    Gout    H. 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 In the ED, he was found to be in atrial fibrillation with a HR of 150. Pharmacy has been consulted to initiate patient on a heparin infusion for Afib  CHADSVASC (agex2, CAD, HTN, CHF) at least 5   Baseline INR 1.3  No signs/symptoms of bleeding noted in chart. H&H trending down, PLT wnl  Goal of Therapy:  Heparin level 0.3-0.7 units/ml Monitor platelets by anticoagulation protocol: Yes   03/20 0146 HL 0.72, supratherapeutic 03/20 1248 HL 0.57, therapeutic x 1  Plan:  Heparin therapeutic x 2 Continue heparin infusion rate of 1250 units/hr Recheck heparin level in am Monitor CBC daily while on heparin  Burnis Medin, PharmD, BCPS Clinical Pharmacist  02/28/2024 2:51 PM

## 2024-02-28 NOTE — Plan of Care (Signed)
   Problem: Education: Goal: Knowledge of General Education information will improve Description: Including pain rating scale, medication(s)/side effects and non-pharmacologic comfort measures Outcome: Not Progressing   Problem: Health Behavior/Discharge Planning: Goal: Ability to manage health-related needs will improve Outcome: Not Progressing   Problem: Clinical Measurements: Goal: Ability to maintain clinical measurements within normal limits will improve Outcome: Not Progressing Goal: Will remain free from infection Outcome: Not Progressing Goal: Diagnostic test results will improve Outcome: Not Progressing Goal: Respiratory complications will improve Outcome: Not Progressing Goal: Cardiovascular complication will be avoided Outcome: Not Progressing   Problem: Activity: Goal: Risk for activity intolerance will decrease Outcome: Not Progressing   Problem: Nutrition: Goal: Adequate nutrition will be maintained Outcome: Not Progressing   Problem: Coping: Goal: Level of anxiety will decrease Outcome: Not Progressing   Problem: Elimination: Goal: Will not experience complications related to bowel motility Outcome: Not Progressing Goal: Will not experience complications related to urinary retention Outcome: Not Progressing   Problem: Pain Managment: Goal: General experience of comfort will improve and/or be controlled Outcome: Not Progressing   Problem: Safety: Goal: Ability to remain free from injury will improve Outcome: Not Progressing   Problem: Skin Integrity: Goal: Risk for impaired skin integrity will decrease Outcome: Not Progressing   Problem: Activity: Goal: Ability to tolerate increased activity will improve Outcome: Not Progressing   Problem: Clinical Measurements: Goal: Ability to maintain a body temperature in the normal range will improve Outcome: Not Progressing   Problem: Respiratory: Goal: Ability to maintain adequate ventilation will  improve Outcome: Not Progressing Goal: Ability to maintain a clear airway will improve Outcome: Not Progressing

## 2024-02-28 NOTE — Progress Notes (Signed)
 PHARMACY - ANTICOAGULATION CONSULT NOTE  Pharmacy Consult for Heparin Infusion Indication: atrial fibrillation  No Known Allergies  Patient Measurements: Height: 5\' 10"  (177.8 cm) Weight: 106.4 kg (234 lb 9.1 oz) IBW/kg (Calculated) : 73 Heparin Dosing Weight: 95.8 kg  Vital Signs: Temp: 98.3 F (36.8 C) (03/20 1230) Temp Source: Oral (03/20 0818) BP: 117/62 (03/20 1230) Pulse Rate: 89 (03/20 1230)  Labs: Recent Labs    02/26/24 1353 02/27/24 0628 02/27/24 1606 02/28/24 0146 02/28/24 0726 02/28/24 1248  HGB 10.0* 9.1*  --   --  8.6*  --   HCT 29.4* 26.5*  --   --  26.4*  --   PLT 314 281  --   --  299  --   APTT  --   --  113*  --   --   --   LABPROT 13.9  --  16.4*  --   --   --   INR 1.0  --  1.3*  --   --   --   HEPARINUNFRC  --   --   --  0.72*  --  0.57  CREATININE 1.15 0.81  --   --   --   --   TROPONINIHS 19* 16  --   --   --   --     Estimated Creatinine Clearance: 90.4 mL/min (by C-G formula based on SCr of 0.81 mg/dL).   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   a.) TTE 07/16/2023: 38 mm   Barrett's esophagus    BPH (benign prostatic hyperplasia)    CAD (coronary artery disease) 10/2018   a.) ETT 11/06/2018: 1mm horizontal inflat ST depression, freq PVC's -> intermediate risk; b.) cCTA 12/23/2018: Ca2+ 2408 (93rd %'ile); FFR 0.6 mLAD, 0.87 mLCx, 0.77 dLCx --> cath recommended; c.) LHC/PCI 01/01/2019:  40% pLAD, 85% mLAD, 70% D2, 70% pLCx (2.75 x 15 mm Resolute Onyx DES), RCA min irregs -- staged PCI; d.) staged PCI 01/21/2019: orbital athrectomy + 3.0 x 15 mm Resolute Onyx DES mLAD   Chronic heart failure with preserved ejection fraction (HCC)    Colon polyps    Duodenal adenoma    Gastritis    GERD (gastroesophageal reflux  disease)    Gout    H. 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 In the ED, he was found to be in atrial fibrillation with a HR of 150. Pharmacy has been consulted to initiate patient on a heparin infusion for Afib  CHADSVASC (agex2, CAD, HTN, CHF) at least 5   Baseline INR and aPTT ordered.  No signs/symptoms of bleeding noted in chart. Hgb 9.1. PLT  281.  Goal of Therapy:  Heparin level 0.3-0.7 units/ml Monitor platelets by anticoagulation protocol: Yes   03/20 0146 HL 0.72, supratherapeutic 03/20 1248 HL 0.57, therapeutic x 1  Plan:  Heparin therapeutic x 1 Continue heparin infusion rate of 1250 units/hr Recheck HL in 8 hrs Monitor CBC daily while on heparin  Paulita Fujita, PharmD Clinical Pharmacist  02/28/2024 1:33 PM

## 2024-02-28 NOTE — Progress Notes (Addendum)
 Progress Note   Patient: Jesse Norton WNU:[id number] DOB: 02-Feb-1946 DOA: 02/26/2024     1 DOS: the patient was seen and examined on 02/28/2024   Brief hospital course:  "Jesse Norton is a 78 year old male with history of non-Hodgkin's lymphoma on chemotherapy, diffuse B-cell lymphoma, BPH, GERD, CAD, anxiety, who presents emergency department for chief concerns of A-fib with RVR. " See H&P for full HPI on admission & ED course.  Patient was admitted with Cardiology consulted for further evaluation and management of new onset atrial fibrillation with RVR.  Further hospital course and management as outlined below.   Assessment and Plan:  Atrial fibrillation with RVR - apparently new onset, no known A-fib history. Converted to NSR after IV diltiazem push given in ED. SVT - intermittent runs with HR up to 180. Echo 02/12/24 - EF 60-65%, grade I DD --Cardiology following --Started PO amiodarone 400 mg BID loading dose x 7 days, then 200 mg BID --On heparin drip, continue >> --Eliquis at discharge  --Telemetry monitoring --Metoprolol 50 mg PO BID  Acute bronchitis  - ruled out LLL pneumonia by CTA chest.  However, 3+ weeks of URI symptoms persisting warrant antimicrobial therapy.   3/20 -- pt feels much better since antibiotics started on admission --Continue Rocephin & Zithromax >> PO antibiotics at d/c for 3-5 day course --Check MRSA screen --Sputum culture if able to collect --IS & flutter --Supportive care PRN per orders  Elevated troponin, minimal Suspect demand ischemia in think of left lower lobe pneumonia as patient denies chest pain or shortness of breath Hx of CAD s/p stent in 2020 --Holding Plavix --On heparin drip >> Eliquis at discharge  Chronic HFpEF -- euvolemic --Resumed home Lasix 20 mg PO daily    Musculoskeletal back pain - Lumbar x-ray no acute findings, shows chronic degenerative changes, no bony metastatic disease was seen.  No red/flag symptoms  including bowel or bladder dysfunction, no radicular symptoms. --Lidocaine patches --Percocet or Tylenol PRM  Essential hypertension --Started on metoprolol --PRN hydralazine  Obesity - Body mass index is 33.66 kg/m. Complicates overall care and prognosis.  Recommend lifestyle modifications including physical activity and diet for weight loss and overall long-term health.       Subjective: Pt seen in ED holding for a bed this AM, wife at bedside. He reports feeling better. Slept great overnight.  Some improvement in his appetite which has been poor for weeks.  Cough is more dry today.  No fever/chills or other complaints.  Physical Exam: Vitals:   02/28/24 0818 02/28/24 0900 02/28/24 1000 02/28/24 1230  BP:  134/66 (!) 125/114 117/62  Pulse:  94 91 89  Resp:  18 20 20   Temp: 98.1 F (36.7 C)   98.3 F (36.8 C)  TempSrc: Oral     SpO2:  97% 93% 93%  Weight:      Height:       General exam: awake, alert, no acute distress, mildly ill-appearing but nontoxic HEENT: moist mucus membranes, hearing grossly normal  Respiratory system: CTAB, no wheezes, rales or rhonchi, normal respiratory effort.  On room air Cardiovascular system: normal S1/S2, tachycardic, regular rhythm, no edema Gastrointestinal system: soft, NT, ND Central nervous system: A&O x 4. no gross focal neurologic deficits, normal speech Skin: dry, intact, normal temperature Psychiatry: normal mood, congruent affect, judgement and insight appear normal   Data Reviewed:  Notable labs --  Hbg 9.1 >> 8.6 INR 1.3  Family Communication: wife at bedside on rounds  3/19, 3/20  Disposition: Status is: Inpatient Remains inpatient appropriate because:  on IV therapies pending further clinical improvement.  Probable d/c in 24-48 hours if improved and HR's stable.    Planned Discharge Destination: Home    Time spent: 42 minutes  Author: Pennie Banter, DO 02/28/2024 1:16 PM  For on call review  [email or web address].

## 2024-02-29 ENCOUNTER — Other Ambulatory Visit: Payer: Self-pay | Admitting: Internal Medicine

## 2024-02-29 ENCOUNTER — Encounter: Payer: Self-pay | Admitting: Internal Medicine

## 2024-02-29 ENCOUNTER — Telehealth: Payer: Self-pay | Admitting: *Deleted

## 2024-02-29 DIAGNOSIS — C8333 Diffuse large B-cell lymphoma, intra-abdominal lymph nodes: Secondary | ICD-10-CM

## 2024-02-29 DIAGNOSIS — I480 Paroxysmal atrial fibrillation: Principal | ICD-10-CM

## 2024-02-29 LAB — CBC
HCT: 23.3 % — ABNORMAL LOW (ref 39.0–52.0)
Hemoglobin: 7.8 g/dL — ABNORMAL LOW (ref 13.0–17.0)
MCH: 35.1 pg — ABNORMAL HIGH (ref 26.0–34.0)
MCHC: 33.5 g/dL (ref 30.0–36.0)
MCV: 105 fL — ABNORMAL HIGH (ref 80.0–100.0)
Platelets: 271 10*3/uL (ref 150–400)
RBC: 2.22 MIL/uL — ABNORMAL LOW (ref 4.22–5.81)
RDW: 14.1 % (ref 11.5–15.5)
WBC: 7.2 10*3/uL (ref 4.0–10.5)
nRBC: 1.1 % — ABNORMAL HIGH (ref 0.0–0.2)

## 2024-02-29 LAB — BASIC METABOLIC PANEL
Anion gap: 11 (ref 5–15)
BUN: 26 mg/dL — ABNORMAL HIGH (ref 8–23)
CO2: 21 mmol/L — ABNORMAL LOW (ref 22–32)
Calcium: 8.2 mg/dL — ABNORMAL LOW (ref 8.9–10.3)
Chloride: 98 mmol/L (ref 98–111)
Creatinine, Ser: 0.89 mg/dL (ref 0.61–1.24)
GFR, Estimated: >60 mL/min (ref >60)
Glucose, Bld: 120 mg/dL — ABNORMAL HIGH (ref 70–99)
Potassium: 4.5 mmol/L (ref 3.5–5.1)
Sodium: 130 mmol/L — ABNORMAL LOW (ref 135–145)

## 2024-02-29 LAB — HEPARIN LEVEL (UNFRACTIONATED): Heparin Unfractionated: 0.54 [IU]/mL (ref 0.30–0.70)

## 2024-02-29 LAB — MAGNESIUM: Magnesium: 1.7 mg/dL (ref 1.7–2.4)

## 2024-02-29 MED ORDER — CEFUROXIME AXETIL 500 MG PO TABS: 500.0000 mg | ORAL_TABLET | Freq: Two times a day (BID) | ORAL | 0 refills | Status: AC

## 2024-02-29 MED ORDER — APIXABAN 5 MG PO TABS: 5.0000 mg | ORAL_TABLET | Freq: Two times a day (BID) | ORAL | 2 refills | Status: DC

## 2024-02-29 MED ORDER — OXYCODONE HCL 5 MG PO TABS: 5.0000 mg | ORAL_TABLET | ORAL | 0 refills | Status: DC | PRN

## 2024-02-29 MED ORDER — FUROSEMIDE 20 MG PO TABS: 20.0000 mg | ORAL_TABLET | Freq: Every day | ORAL | 2 refills | Status: DC

## 2024-02-29 MED ORDER — METOPROLOL TARTRATE 50 MG PO TABS: 50.0000 mg | ORAL_TABLET | Freq: Two times a day (BID) | ORAL | 2 refills | Status: DC

## 2024-02-29 MED ORDER — ENSURE ENLIVE PO LIQD
237.0000 mL | Freq: Two times a day (BID) | ORAL | Status: DC
  Administered 2024-02-29: 237 mL via ORAL

## 2024-02-29 MED ORDER — AZITHROMYCIN 250 MG PO TABS: ORAL_TABLET | ORAL | 0 refills | Status: DC

## 2024-02-29 MED ORDER — ENSURE ENLIVE PO LIQD: 237.0000 mL | Freq: Two times a day (BID) | ORAL | Status: DC

## 2024-02-29 MED ORDER — APIXABAN 5 MG PO TABS
5.0000 mg | ORAL_TABLET | Freq: Two times a day (BID) | ORAL | Status: DC
  Administered 2024-02-29: 5 mg via ORAL
  Filled 2024-02-29: qty 1

## 2024-02-29 MED ORDER — AMIODARONE HCL 200 MG PO TABS: ORAL_TABLET | ORAL | 0 refills | Status: DC

## 2024-02-29 MED ORDER — AZITHROMYCIN 250 MG PO TABS
500.0000 mg | ORAL_TABLET | Freq: Every day | ORAL | Status: DC
  Administered 2024-02-29: 500 mg via ORAL
  Filled 2024-02-29: qty 2

## 2024-02-29 MED ORDER — CEFUROXIME AXETIL 500 MG PO TABS
500.0000 mg | ORAL_TABLET | Freq: Two times a day (BID) | ORAL | Status: DC
  Filled 2024-02-29: qty 1

## 2024-02-29 NOTE — Telephone Encounter (Signed)
 The wife and pt wants to get back on treatment with Dr. Donneta Romberg  asap.

## 2024-02-29 NOTE — Progress Notes (Signed)
 PHARMACY - ANTICOAGULATION CONSULT NOTE  Pharmacy Consult for Heparin Infusion Indication: atrial fibrillation  No Known Allergies  Patient Measurements: Height: 5\' 10"  (177.8 cm) Weight: 106.4 kg (234 lb 9.1 oz) IBW/kg (Calculated) : 73 Heparin Dosing Weight: 95.8 kg  Vital Signs: Temp: 97.4 F (36.3 C) (03/21 0344) BP: 129/64 (03/21 0344) Pulse Rate: 72 (03/21 0344)  Labs: Recent Labs    02/26/24 1353 02/27/24 0628 02/27/24 1606 02/28/24 0146 02/28/24 0726 02/28/24 1248 02/28/24 2116 02/29/24 0512  HGB 10.0* 9.1*  --   --  8.6*  --   --  7.8*  HCT 29.4* 26.5*  --   --  26.4*  --   --  23.3*  PLT 314 281  --   --  299  --   --  271  APTT  --   --  113*  --   --   --   --   --   LABPROT 13.9  --  16.4*  --   --   --   --   --   INR 1.0  --  1.3*  --   --   --   --   --   HEPARINUNFRC  --   --   --    < >  --  0.57 0.64 0.54  CREATININE 1.15 0.81  --   --   --   --   --  0.89  TROPONINIHS 19* 16  --   --   --   --   --   --    < > = values in this interval not displayed.    Estimated Creatinine Clearance: 82.2 mL/min (by C-G formula based on SCr of 0.89 mg/dL).   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   a.) TTE 07/16/2023: 38 mm   Barrett's esophagus    BPH (benign prostatic hyperplasia)    CAD (coronary artery disease) 10/2018   a.) ETT 11/06/2018: 1mm horizontal inflat ST depression, freq PVC's -> intermediate risk; b.) cCTA 12/23/2018: Ca2+ 2408 (93rd %'ile); FFR 0.6 mLAD, 0.87 mLCx, 0.77 dLCx --> cath recommended; c.) LHC/PCI 01/01/2019:  40% pLAD, 85% mLAD, 70% D2, 70% pLCx (2.75 x 15 mm Resolute Onyx DES), RCA min irregs -- staged PCI; d.) staged PCI 01/21/2019: orbital athrectomy + 3.0 x 15 mm Resolute Onyx DES mLAD   Chronic  heart failure with preserved ejection fraction (HCC)    Colon polyps    Duodenal adenoma    Gastritis    GERD (gastroesophageal reflux disease)    Gout    H. 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 In  the ED, he was found to be in atrial fibrillation with a HR of 150. Pharmacy has been consulted to initiate patient on a heparin infusion for Afib  CHADSVASC (agex2, CAD, HTN, CHF) at least 5   Baseline INR 1.3  No signs/symptoms of bleeding noted in chart. H&H trending down, PLT wnl  Goal of Therapy:  Heparin level 0.3-0.7 units/ml Monitor platelets by anticoagulation protocol: Yes   03/20 0146 HL 0.72, supratherapeutic 03/20 1248 HL 0.57, therapeutic x 1 03/21 0512 HL 0.54. therapeutic x 3, Hgb trending down  Plan:  Continue heparin infusion rate of 1250 units/hr Recheck heparin level daily w/ AM labs while therapeutic Monitor CBC daily while on heparin  Otelia Sergeant, PharmD, [facility] 02/29/2024 6:54 AM

## 2024-02-29 NOTE — Discharge Summary (Signed)
 Physician Discharge Summary   Patient: Jesse Norton MRN: [id number] DOB: 09-05-1946  Admit date:     02/26/2024  Discharge date: 02/29/2024  Discharge Physician: Pennie Banter   PCP: Marjie Skiff, NP   Recommendations at discharge:    Follow up with Cardiology Follow up with Primary Care Follow up with Oncology Repeat CBC, BMP, Mg at follow up Follow up on BP and HR control  Discharge Diagnoses: Principal Problem:   Paroxysmal atrial fibrillation (HCC) Active Problems:   Chronic heart failure with preserved ejection fraction (HCC)   DLBCL (diffuse large B cell lymphoma) (HCC)   Essential hypertension   Chronic anxiety   BPH (benign prostatic hyperplasia)   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 *  Hospital Course:  "Norberto Wishon is a 78 year old male with history of non-Hodgkin's lymphoma on chemotherapy, diffuse B-cell lymphoma, BPH, GERD, CAD, anxiety, who presents emergency department for chief concerns of A-fib with RVR. " See H&P for full HPI on admission & ED course.   Patient was admitted with Cardiology consulted for further evaluation and management of new onset atrial fibrillation with RVR.   Further hospital course and management as outlined below.  3/21 -- pt feeling better this AM.  HR's controlled.  Cleared by Cardiology and is medically stable, requesting discharge home today.     Assessment and Plan:  Atrial fibrillation with RVR - apparently new onset, no known A-fib history. Converted to NSR after IV diltiazem push given in ED. SVT - intermittent runs with HR up to 180. Echo 02/12/24 - EF 60-65%, grade I  DD --Cardiology following --Started PO amiodarone 400 mg BID loading dose x 7 days, then 200 mg BID --Transitioned from heparin drip  >> Eliquis at discharge  --Telemetry monitoring --Metoprolol 50 mg PO BID   Acute bronchitis  - ruled out LLL pneumonia by CTA chest.  However, 3+ weeks of URI symptoms persisting warrant antimicrobial therapy.   3/20 -- pt feels much better since antibiotics started on admission --Treated with Rocephin & Zithromax >> PO antibiotics at d/c --IS & flutter --Supportive care PRN per orders   Elevated troponin, minimal Suspect demand ischemia in think of left lower lobe pneumonia as patient denies chest pain or shortness of breath Hx of CAD s/p stent in 2020 --Holding Plavix --Transitioned heparin drip >> Eliquis at discharge   Chronic HFpEF -- euvolemic --Resumed home Lasix 20 mg PO daily     Musculoskeletal back pain - Lumbar x-ray no acute findings, shows chronic degenerative changes, no bony metastatic disease was seen.  No red/flag symptoms including bowel or bladder dysfunction, no radicular symptoms. --Lidocaine patches --Percocet or Tylenol PRM   Essential hypertension --Started on metoprolol --PRN hydralazine   Obesity - Body mass index is 33.66 kg/m. Complicates overall care and prognosis.  Recommend lifestyle modifications including physical activity and diet for weight loss and overall long-term health.         Consultants: Cardiology Procedures performed: None  Disposition: Home Diet recommendation:  Cardiac diet DISCHARGE MEDICATION: Allergies as of 02/29/2024   No Known Allergies      Medication List  STOP taking these medications    clopidogrel 75 MG tablet Commonly known as: PLAVIX   losartan 100 MG tablet Commonly known as: COZAAR   pantoprazole 20 MG tablet Commonly known as: PROTONIX   predniSONE 50 MG tablet Commonly known as: DELTASONE       TAKE these medications    acyclovir 400 MG  tablet Commonly known as: ZOVIRAX Take 1 tablet (400 mg total) by mouth 2 (two) times daily.   allopurinol 300 MG tablet Commonly known as: ZYLOPRIM Take 1 tablet (300 mg total) by mouth 2 (two) times daily.   amiodarone 200 MG tablet Commonly known as: Pacerone Take 2 tablets (400 mg total) by mouth 2 (two) times daily for 5 days, THEN 1 tablet (200 mg total) daily. Start taking on: February 29, 2024   apixaban 5 MG Tabs tablet Commonly known as: ELIQUIS Take 1 tablet (5 mg total) by mouth 2 (two) times daily.   atorvastatin 40 MG tablet Commonly known as: LIPITOR TAKE 1 TABLET(40 MG) BY MOUTH DAILY   azithromycin 250 MG tablet Commonly known as: ZITHROMAX Take one tablet (500 mg) by mouth once daily for 2 days   clobetasol ointment 0.05 % Commonly known as: TEMOVATE Apply topically as needed.   feeding supplement Liqd Take 237 mLs by mouth 2 (two) times daily between meals.   FLUoxetine 10 MG tablet Commonly known as: PROZAC Take 1 tablet (10 mg total) by mouth daily.   furosemide 20 MG tablet Commonly known as: LASIX Take 1 tablet (20 mg total) by mouth daily.   glucose blood test strip To check blood sugar twice a day with goal fasting in morning <130 and goal 2 hours after meal <180.  Write all checks down for provider visits.   lidocaine 5 % Commonly known as: Lidoderm Place 1 patch onto the skin daily. Remove & Discard patch within 12 hours or as directed by MD   lidocaine-prilocaine cream Commonly known as: EMLA Apply on the port. 30 -45 min  prior to port access.   metoprolol tartrate 50 MG tablet Commonly known as: LOPRESSOR Take 1 tablet (50 mg total) by mouth 2 (two) times daily.   nitroGLYCERIN 0.4 MG SL tablet Commonly known as: Nitrostat Place 1 tablet (0.4 mg total) under the tongue every 5 (five) minutes as needed for chest pain. Maximum of 3 doses.   ondansetron 8 MG tablet Commonly known as: ZOFRAN One pill every 8 hours as needed for  nausea/vomitting.   onetouch ultrasoft lancets 1 each by Other route 2 (two) times daily.   OneTouch Verio w/Device Kit To check blood sugar twice a day with goal fasting in morning <130 and goal 2 hours after meal <180.  Write all checks down for provider visits.   oxyCODONE 5 MG immediate release tablet Commonly known as: Oxy IR/ROXICODONE Take 1 tablet (5 mg total) by mouth every 4 (four) hours as needed for severe pain (pain score 7-10).   prochlorperazine 10 MG tablet Commonly known as: COMPAZINE Take 1 tablet (10 mg total) by mouth every 6 (six) hours as needed for nausea or vomiting.       ASK your doctor about these medications    cefUROXime 500 MG tablet Commonly known as: CEFTIN Take 1 tablet (500 mg total) by mouth 2 (two) times daily with a meal for 2 days. Ask about: Should I take this medication?   HYDROcodone-acetaminophen 5-325 MG tablet Commonly known as: NORCO/VICODIN Take 1 tablet by mouth every 6 (  six) hours as needed for up to 10 days. Ask about: Should I take this medication?        Discharge Exam: Filed Weights   02/26/24 1339  Weight: 106.4 kg   General exam: awake, alert, no acute distress HEENT: moist mucus membranes, hearing grossly normal  Respiratory system: CTAB no wheezes, rales or rhonchi, normal respiratory effort. Cardiovascular system: normal S1/S2, RRR, no JVD, murmurs, rubs, gallops, no pedal edema.   Gastrointestinal system: soft, NT, ND, no HSM felt, +bowel sounds. Central nervous system: A&O x 4. no gross focal neurologic deficits, normal speech Skin: dry, intact, normal temperaturers Psychiatry: normal mood, congruent affect, judgement and insight appear normal   Condition at discharge: stable  The results of significant diagnostics from this hospitalization (including imaging, microbiology, ancillary and laboratory) are listed below for reference.   Imaging Studies: US Venous Img Upper Bilat (DVT) Result Date:  03/06/2024 CLINICAL DATA:  Bilateral upper extremity swelling. EXAM: BILATERAL UPPER EXTREMITY VENOUS DOPPLER ULTRASOUND TECHNIQUE: Gray-scale sonography with graded compression, as well as color Doppler and duplex ultrasound were performed to evaluate the bilateral upper extremity deep venous systems from the level of the subclavian vein and including the jugular, axillary, basilic, radial, ulnar and upper cephalic vein. Spectral Doppler was utilized to evaluate flow at rest and with distal augmentation maneuvers. COMPARISON:  February 27, 2024. FINDINGS: RIGHT UPPER EXTREMITY Internal Jugular Vein: No evidence of thrombus. Normal compressibility, respiratory phasicity and response to augmentation. Subclavian Vein: No evidence of thrombus. Normal compressibility, respiratory phasicity and response to augmentation. Axillary Vein: No evidence of thrombus. Normal compressibility, respiratory phasicity and response to augmentation. Cephalic Vein: No evidence of thrombus. Normal compressibility, respiratory phasicity and response to augmentation. Basilic Vein: No evidence of thrombus. Normal compressibility, respiratory phasicity and response to augmentation. Brachial Veins: No evidence of thrombus. Normal compressibility, respiratory phasicity and response to augmentation. Radial Veins: No evidence of thrombus. Normal compressibility, respiratory phasicity and response to augmentation. Ulnar Veins: No evidence of thrombus. Normal compressibility, respiratory phasicity and response to augmentation. Venous Reflux:  None. Multiple enlarged lymph nodes are noted in right cervical region concerning for metastatic disease, the largest measuring 2.1 cm in minor axis. LEFT UPPER EXTREMITY Internal Jugular Vein: No evidence of thrombus. Normal compressibility, respiratory phasicity and response to augmentation. Subclavian Vein: No evidence of thrombus. Normal compressibility, respiratory phasicity and response to augmentation.  Axillary Vein: No evidence of thrombus. Normal compressibility, respiratory phasicity and response to augmentation. Cephalic Vein: No evidence of thrombus. Normal compressibility, respiratory phasicity and response to augmentation. Basilic Vein: No evidence of thrombus. Normal compressibility, respiratory phasicity and response to augmentation. Brachial Veins: No evidence of thrombus. Normal compressibility, respiratory phasicity and response to augmentation. Radial Veins: No evidence of thrombus. Normal compressibility, respiratory phasicity and response to augmentation. Ulnar Veins: No evidence of thrombus. Normal compressibility, respiratory phasicity and response to augmentation. Venous Reflux:  None. Other Findings: Multiple enlarged left cervical lymph nodes are noted consistent with metastatic disease, the largest measuring 2.8 cm in minor axis. IMPRESSION: No evidence of DVT within either upper extremity. Bilateral cervical adenopathy is noted consistent with metastatic disease. Electronically Signed   By: Lupita Raider M.D.   On: 03/06/2024 14:43   US Venous Img Lower Unilateral Right (DVT) Result Date: 03/05/2024 CLINICAL DATA:  Right leg swelling. EXAM: RIGHT LOWER EXTREMITY VENOUS DOPPLER ULTRASOUND TECHNIQUE: Gray-scale sonography with graded compression, as well as color Doppler and duplex ultrasound were performed to evaluate the lower extremity deep venous  systems from the level of the common femoral vein and including the common femoral, femoral, profunda femoral, popliteal and calf veins including the posterior tibial, peroneal and gastrocnemius veins when visible. The superficial great saphenous vein was also interrogated. Spectral Doppler was utilized to evaluate flow at rest and with distal augmentation maneuvers in the common femoral, femoral and popliteal veins. COMPARISON:  None Available. FINDINGS: Contralateral Common Femoral Vein: Respiratory phasicity is normal and symmetric with the  symptomatic side. No evidence of thrombus. Normal compressibility. Common Femoral Vein: No evidence of thrombus. Normal compressibility, respiratory phasicity and response to augmentation. Saphenofemoral Junction: No evidence of thrombus. Normal compressibility and flow on color Doppler imaging. Profunda Femoral Vein: No evidence of thrombus. Normal compressibility and flow on color Doppler imaging. Femoral Vein: No evidence of thrombus. Normal compressibility, respiratory phasicity and response to augmentation. Popliteal Vein: No evidence of thrombus. Normal compressibility, respiratory phasicity and response to augmentation. Calf Veins: Visualized right deep calf veins are patent without thrombus. Other Findings:  None. IMPRESSION: Negative for deep venous thrombosis in right lower extremity. Electronically Signed   By: Richarda Overlie M.D.   On: 03/05/2024 15:29   US Venous Img Upper Uni Left (DVT) Result Date: 02/27/2024 CLINICAL DATA:  Swelling.  History of lymphoma EXAM: Left UPPER EXTREMITY VENOUS DOPPLER ULTRASOUND TECHNIQUE: Gray-scale sonography with graded compression, as well as color Doppler and duplex ultrasound were performed to evaluate the upper extremity deep venous system from the level of the subclavian vein and including the jugular, axillary, basilic, radial, ulnar and upper cephalic vein. Spectral Doppler was utilized to evaluate flow at rest and with distal augmentation maneuvers. COMPARISON:  None Available. FINDINGS: Contralateral Subclavian Vein: Respiratory phasicity is normal and symmetric with the symptomatic side. No evidence of thrombus. Normal compressibility. Internal Jugular Vein: No evidence of thrombus. Normal compressibility, respiratory phasicity and response to augmentation. Subclavian Vein: No evidence of thrombus. Normal compressibility, respiratory phasicity and response to augmentation. Axillary Vein: No evidence of thrombus. Normal compressibility, respiratory phasicity and  response to augmentation. Cephalic Vein: No evidence of thrombus. Normal compressibility, respiratory phasicity and response to augmentation. Basilic Vein: No evidence of thrombus. Normal compressibility, respiratory phasicity and response to augmentation. Brachial Veins: No evidence of thrombus. Normal compressibility, respiratory phasicity and response to augmentation. Radial Veins: No evidence of thrombus. Normal compressibility, respiratory phasicity and response to augmentation. Ulnar Veins: No evidence of thrombus. Normal compressibility, respiratory phasicity and response to augmentation. Venous Reflux:  None visualized. Other Findings: Note is made of numerous abnormal nodes identified in the visualized neck. Please correlate with prior workup. Scattered soft tissue edema as well along the forearm. IMPRESSION: No evidence of DVT within the left upper extremity. Electronically Signed   By: Karen Kays M.D.   On: 02/27/2024 14:52   DG Lumbar Spine 2-3 Views Result Date: 02/26/2024 CLINICAL DATA:  Back pain EXAM: LUMBAR SPINE - 2-3 VIEW COMPARISON:  CT 01/31/2024 FINDINGS: Degenerative disc disease most pronounced at L5-S1 with disc space narrowing, spurring, and vacuum disc. Diffuse degenerative facet disease, most pronounced in the lower lumbar spine. Normal alignment. No fracture. IMPRESSION: Degenerative disc and facet disease.  No acute bony abnormality. Electronically Signed   By: Charlett Nose M.D.   On: 02/26/2024 23:27   CT Angio Chest PE W/Cm &/Or Wo Cm Result Date: 02/26/2024 CLINICAL DATA:  PE suspected. High probability. * Tracking Code: BO * EXAM: CT ANGIOGRAPHY CHEST WITH CONTRAST TECHNIQUE: Multidetector CT imaging of the chest was performed using the  standard protocol during bolus administration of intravenous contrast. Multiplanar CT image reconstructions and MIPs were obtained to evaluate the vascular anatomy. RADIATION DOSE REDUCTION: This exam was performed according to the  departmental dose-optimization program which includes automated exposure control, adjustment of the mA and/or kV according to patient size and/or use of iterative reconstruction technique. CONTRAST:  75mL OMNIPAQUE IOHEXOL 350 MG/ML SOLN COMPARISON:  PET-CT 02/05/2024 FINDINGS: Cardiovascular: No filling defects within the pulmonary arteries to suggest acute pulmonary embolism. Mediastinum/Nodes: Bulky mediastinum and LEFT axillary lymphadenopathy. RIGHT lower paratracheal node measures 3.4 cm short axis compared to 3.1 cm on PET-CT. LEFT supraclavicular node measures 3.2 cm compared to 2.3 cm. LEFT axillary nodal mass with bulky nodes. Individual node measures up to 3.4 cm compared to 2.3 cm. Node beneath the pectoralis muscle measures 2.7 cm compared to 2.0 cm. Visually nodes appear larger. Lungs/Pleura: Interstitial edema pattern. no  Infarction.  No pneumonia. 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 Multiple small spleen spanning diaphragm. Adrenal glands normal. Musculoskeletal: No aggressive osseous lesion. Review of the MIP images confirms the above findings. IMPRESSION: 1. No acute pulmonary embolism. 2. No acute pulmonary parenchymal findings. 3. mild interstitial edema pattern. 4. Bulky mediastinal, supraclavicular and LEFT axillary adenopathy. The bulky LEFT axillary adenopathy is increased in size. Electronically Signed   By: Genevive Bi M.D.   On: 02/26/2024 17:07   DG Chest Port 1 View Result Date: 02/26/2024 CLINICAL DATA:  AFib with RVR EXAM: PORTABLE CHEST 1 VIEW COMPARISON:  Chest radiograph dated 02/13/2024 FINDINGS: Lines/tubes: Right chest wall port tip projects over the SVC. Lungs: Unchanged asymmetric elevation of the right hemidiaphragm. Low lung volumes with bronchovascular crowding. Hazy and patchy left lower lung opacity. Pleura: No pneumothorax or pleural effusion. Heart/mediastinum: Similar mildly enlarged  cardiomediastinal silhouette. Bones: No acute osseous abnormality. IMPRESSION: Low lung volumes with bronchovascular crowding. Hazy and patchy left lower lung opacity, likely atelectasis. Aspiration or pneumonia can be considered in the appropriate clinical setting. Electronically Signed   By: Agustin Cree M.D.   On: 02/26/2024 16:30   DG Chest Port 1 View Result Date: 02/13/2024 CLINICAL DATA:  Recurrent lymphoma, port catheter placement EXAM: PORTABLE CHEST 1 VIEW COMPARISON:  07/03/2018 FINDINGS: Right IJ power port catheter tip mid SVC level. No effusion or pneumothorax. Low lung volumes. Bibasilar atelectasis. Negative for edema. Mediastinal and hilar adenopathy better appreciated by comparison PET-CT. Aorta atherosclerotic. Trachea midline. IMPRESSION: 1. Right IJ port catheter tip mid SVC level. No pneumothorax. 2. Low lung volumes with bibasilar atelectasis. Electronically Signed   By: Judie Petit.  Shick M.D.   On: 02/13/2024 13:40   DG C-Arm 1-60 Min-No Report Result Date: 02/13/2024 Fluoroscopy was utilized by the requesting physician.  No radiographic interpretation.   ECHOCARDIOGRAM COMPLETE Result Date: 02/12/2024    ECHOCARDIOGRAM REPORT   Patient Name:   ALIEU FINNIGAN Date of Exam: 02/12/2024 Medical Rec #:  [id number]        Height:       70.5 in Accession #:    [id number]       Weight:       235.0 lb Date of Birth:  1946/10/27        BSA:          2.247 m Patient Age:    78 years         BP:           116/72 mmHg Patient Gender: M  HR:           86 bpm. Exam Location:  [facility] Procedure: 2D Echo, Color Doppler, Cardiac Doppler and Intracardiac            Opacification Agent (Both Spectral and Color Flow Doppler were            utilized during procedure). Indications:    R06.9 DOE; Z51.11 Encounter for antineoplastic chemotheraphy  History:        Patient has prior history of Echocardiogram examinations, most                 recent 07/16/2023. CHF, CAD, Signs/Symptoms:Dyspnea and Shortness                  of Breath; Risk Factors:Hypertension, Dyslipidemia and                 Non-Smoker.  Sonographer:    Ilda Mori MHA, BS, RDCS Referring Phys: [phone] [facility]  Sonographer Comments: Technically challenging study due to limited acoustic windows and patient is obese. Image acquisition challenging due to patient body habitus. IMPRESSIONS  1. Left ventricular ejection fraction, by estimation, is 60 to 65%. The left ventricle has normal function. The left ventricle has no regional wall motion abnormalities. Left ventricular diastolic parameters are consistent with Grade I diastolic dysfunction (impaired relaxation).  2. Right ventricular systolic function is normal. The right ventricular size is normal. There is normal pulmonary artery systolic pressure. The estimated right ventricular systolic pressure is 21.8 mmHg.  3. Left atrial size was mildly dilated.  4. The mitral valve is normal in structure. No evidence of mitral valve regurgitation. No evidence of mitral stenosis.  5. The aortic valve is normal in structure. There is mild calcification of the aortic valve. Aortic valve regurgitation is not visualized. Aortic valve sclerosis is present, with no evidence of aortic valve stenosis.  6. The inferior vena cava is normal in size with greater than 50% respiratory variability, suggesting right atrial pressure of 3 mmHg. FINDINGS  Left Ventricle: Left ventricular ejection fraction, by estimation, is 60 to 65%. The left ventricle has normal function. The left ventricle has no regional wall motion abnormalities. Strain was performed and the global longitudinal strain is indeterminate. The left ventricular internal cavity size was normal in size. There is no left ventricular hypertrophy. Left ventricular diastolic parameters are consistent with Grade I diastolic dysfunction (impaired relaxation). Right Ventricle: The right ventricular size is normal. No increase in right ventricular wall thickness.  Right ventricular systolic function is normal. There is normal pulmonary artery systolic pressure. The tricuspid regurgitant velocity is 2.05 m/s, and  with an assumed right atrial pressure of 5 mmHg, the estimated right ventricular systolic pressure is 21.8 mmHg. Left Atrium: Left atrial size was mildly dilated. Right Atrium: Right atrial size was normal in size. Pericardium: There is no evidence of pericardial effusion. Mitral Valve: The mitral valve is normal in structure. No evidence of mitral valve regurgitation. No evidence of mitral valve stenosis. Tricuspid Valve: The tricuspid valve is normal in structure. Tricuspid valve regurgitation is mild . No evidence of tricuspid stenosis. Aortic Valve: The aortic valve is normal in structure. There is mild calcification of the aortic valve. Aortic valve regurgitation is not visualized. Aortic valve sclerosis is present, with no evidence of aortic valve stenosis. Aortic valve mean gradient  measures 4.0 mmHg. Aortic valve peak gradient measures 7.0 mmHg. Pulmonic Valve: The pulmonic valve was normal in structure. Pulmonic valve regurgitation is not  visualized. No evidence of pulmonic stenosis. Aorta: The aortic root is normal in size and structure. Venous: The inferior vena cava is normal in size with greater than 50% respiratory variability, suggesting right atrial pressure of 3 mmHg. IAS/Shunts: No atrial level shunt detected by color flow Doppler. Additional Comments: 3D was performed not requiring image post processing on an independent workstation and was indeterminate.  LEFT VENTRICLE PLAX 2D LVIDd:         4.60 cm Diastology LVIDs:         3.80 cm LV e' medial:    8.92 cm/s LV PW:         0.80 cm LV E/e' medial:  6.1 LV IVS:        0.80 cm LV e' lateral:   8.49 cm/s                        LV E/e' lateral: 6.4  RIGHT VENTRICLE RV S prime:     17.00 cm/s TAPSE (M-mode): 3.3 cm LEFT ATRIUM             Index LA diam:        4.90 cm 2.18 cm/m LA Vol (A2C):   56.8  ml 25.28 ml/m LA Vol (A4C):   34.6 ml 15.40 ml/m LA Biplane Vol: 44.5 ml 19.80 ml/m  AORTIC VALVE AV Vmax:           132.00 cm/s AV Vmean:          87.100 cm/s AV VTI:            0.211 m AV Peak Grad:      7.0 mmHg AV Mean Grad:      4.0 mmHg LVOT Vmax:         121.00 cm/s LVOT Vmean:        78.100 cm/s LVOT VTI:          0.205 m LVOT/AV VTI ratio: 0.97  AORTA Ao Root diam: 3.50 cm Ao Asc diam:  3.80 cm MITRAL VALVE               TRICUSPID VALVE MV Area (PHT): 3.42 cm    TR Peak grad:   16.8 mmHg MV Decel Time: 222 msec    TR Vmax:        205.00 cm/s MV E velocity: 54.50 cm/s MV A velocity: 93.80 cm/s  SHUNTS MV E/A ratio:  0.58        Systemic VTI: 0.20 m Julien Nordmann MD Electronically signed by Julien Nordmann MD Signature Date/Time: 02/12/2024/7:32:02 PM    Final     Microbiology: Results for orders placed or performed during the hospital encounter of 02/26/24  Resp panel by RT-PCR (RSV, Flu A&B, Covid) Anterior Nasal Swab     Status: None   Collection Time: 02/26/24  3:13 PM   Specimen: Anterior Nasal Swab  Result Value Ref Range Status   SARS Coronavirus 2 by RT PCR NEGATIVE NEGATIVE Final    Comment: (NOTE) SARS-CoV-2 target nucleic acids are NOT DETECTED.  The SARS-CoV-2 RNA is generally detectable in upper respiratory specimens during the acute phase of infection. The lowest concentration of SARS-CoV-2 viral copies this assay can detect is 138 copies/mL. A negative result does not preclude SARS-Cov-2 infection and should not be used as the sole basis for treatment or other patient management decisions. A negative result may occur with  improper specimen collection/handling, submission of specimen other than nasopharyngeal swab, presence of  viral mutation(s) within the areas targeted by this assay, and inadequate number of viral copies(<138 copies/mL). A negative result must be combined with clinical observations, patient history, and epidemiological information. The expected result  is Negative.  Fact Sheet for Patients:  BloggerCourse.com  Fact Sheet for Healthcare Providers:  SeriousBroker.it  This test is no t yet approved or cleared by the [place] FDA and  has been authorized for detection and/or diagnosis of SARS-CoV-2 by FDA under an Emergency Use Authorization (EUA). This EUA will remain  in effect (meaning this test can be used) for the duration of the COVID-19 declaration under Section 564(b)(1) of the Act, 21 U.S.C.section 360bbb-3(b)(1), unless the authorization is terminated  or revoked sooner.       Influenza A by PCR NEGATIVE NEGATIVE Final   Influenza B by PCR NEGATIVE NEGATIVE Final    Comment: (NOTE) The Xpert Xpress SARS-CoV-2/FLU/RSV plus assay is intended as an aid in the diagnosis of influenza from Nasopharyngeal swab specimens and should not be used as a sole basis for treatment. Nasal washings and aspirates are unacceptable for Xpert Xpress SARS-CoV-2/FLU/RSV testing.  Fact Sheet for Patients: BloggerCourse.com  Fact Sheet for Healthcare Providers: SeriousBroker.it  This test is not yet approved or cleared by the [place] FDA and has been authorized for detection and/or diagnosis of SARS-CoV-2 by FDA under an Emergency Use Authorization (EUA). This EUA will remain in effect (meaning this test can be used) for the duration of the COVID-19 declaration under Section 564(b)(1) of the Act, 21 U.S.C. section 360bbb-3(b)(1), unless the authorization is terminated or revoked.     Resp Syncytial Virus by PCR NEGATIVE NEGATIVE Final    Comment: (NOTE) Fact Sheet for Patients: BloggerCourse.com  Fact Sheet for Healthcare Providers: SeriousBroker.it  This test is not yet approved or cleared by the [place] FDA and has been authorized for detection and/or diagnosis of  SARS-CoV-2 by FDA under an Emergency Use Authorization (EUA). This EUA will remain in effect (meaning this test can be used) for the duration of the COVID-19 declaration under Section 564(b)(1) of the Act, 21 U.S.C. section 360bbb-3(b)(1), unless the authorization is terminated or revoked.  Performed at [facility], [address] [place], [place] [address]   MRSA Next Gen by PCR, Nasal     Status: None   Collection Time: 02/28/24 11:10 AM   Specimen: Nasal Mucosa; Nasal Swab  Result Value Ref Range Status   MRSA by PCR Next Gen NOT DETECTED NOT DETECTED Final    Comment: (NOTE) The GeneXpert MRSA Assay (FDA approved for NASAL specimens only), is one component of a comprehensive MRSA colonization surveillance program. It is not intended to diagnose MRSA infection nor to guide or monitor treatment for MRSA infections. Test performance is not FDA approved in patients less than 27 years old. Performed at [facility], [address] [place], [place] [address]     Labs: CBC: Recent Labs  Lab 03/09/24 [phone] 03/10/24 0510 03/11/24 0642 03/12/24 1025 03/13/24 0602  WBC 11.2* 11.3* 4.1 1.2* 0.8*  NEUTROABS  --   --   --  0.5*  --   HGB 7.8* 7.3* 7.9* 6.3* 7.0*  HCT 22.8* 21.5* 23.3* 19.1* 21.5*  MCV 101.3* 101.4* 101.3* 104.9* 101.4*  PLT 317 261 205 148* 120*   Basic Metabolic Panel: Recent Labs  Lab 03/09/24 0605 03/11/24 0642 03/12/24 1020 03/13/24 0602  NA 131* 135 135 141  K 3.9 3.8 3.9 5.4*  CL 97* 102  102 107  CO2 24 25 25 22   GLUCOSE 85 105* 192* 196*  BUN 43* 37* 40* 40*  CREATININE 1.24 1.07 1.02 1.20  CALCIUM 8.4* 8.4* 8.2* 8.8*   Liver Function Tests: Recent Labs  Lab 03/12/24 1020 03/13/24 0602  AST 25 35  ALT 16 18  ALKPHOS 46 49  BILITOT 0.4 0.9  PROT 4.4* 4.9*  ALBUMIN 2.1* 2.2*   CBG: No results for input(s): "GLUCAP" in the last 168 hours.  Discharge time spent: less than 30 minutes.  Signed: Pennie Banter,  DO [facility] 03/13/2024

## 2024-02-29 NOTE — Plan of Care (Signed)
  Problem: Education: Goal: Knowledge of General Education information will improve Description: Including pain rating scale, medication(s)/side effects and non-pharmacologic comfort measures Outcome: Progressing   Problem: Coping: Goal: Level of anxiety will decrease Outcome: Progressing   Problem: Safety: Goal: Ability to remain free from injury will improve Outcome: Progressing   Problem: Respiratory: Goal: Ability to maintain adequate ventilation will improve Outcome: Progressing

## 2024-02-29 NOTE — Progress Notes (Signed)
 DISCONTINUE ON PATHWAY REGIMEN - Lymphoma and CLL     A cycle is every 21 days:     Prednisone      Rituximab-xxxx      Cyclophosphamide      Doxorubicin      Vincristine   **Always confirm dose/schedule in your pharmacy ordering system**  PRIOR TREATMENT: [id number]: R(IV)-CHOP q21 Days x 6 Cycles  START ON PATHWAY REGIMEN - Lymphoma and CLL     Cycles 1 through 6: A cycle is every 21 days:     Prednisone      Rituximab-xxxx      Polatuzumab vedotin-piiq      Cyclophosphamide      Doxorubicin      Pegfilgrastim-xxxx    Cycles 7 and 8: A cycle is every 21 days:     Rituximab-xxxx   **Always confirm dose/schedule in your pharmacy ordering system**  Patient Characteristics: Diffuse Large B-Cell Lymphoma or Follicular Lymphoma, Grade 3B, First Line, Stage III and IV Disease Type: Not Applicable Disease Type: Diffuse Large B-Cell Lymphoma Disease Type: Not Applicable Line of therapy: First Line Intent of Therapy: Curative Intent, Discussed with Patient

## 2024-02-29 NOTE — Progress Notes (Signed)
 AVS reviewed and given to patient. PIV removed. All questions answered.

## 2024-02-29 NOTE — Plan of Care (Signed)

## 2024-03-01 ENCOUNTER — Other Ambulatory Visit: Payer: Self-pay

## 2024-03-03 ENCOUNTER — Other Ambulatory Visit: Payer: Self-pay | Admitting: *Deleted

## 2024-03-03 ENCOUNTER — Other Ambulatory Visit: Payer: Self-pay

## 2024-03-03 ENCOUNTER — Encounter: Payer: Self-pay | Admitting: Internal Medicine

## 2024-03-03 ENCOUNTER — Telehealth: Payer: Self-pay | Admitting: *Deleted

## 2024-03-03 DIAGNOSIS — C8333 Diffuse large B-cell lymphoma, intra-abdominal lymph nodes: Secondary | ICD-10-CM

## 2024-03-03 DIAGNOSIS — R7303 Prediabetes: Secondary | ICD-10-CM | POA: Diagnosis not present

## 2024-03-03 LAB — MICROALBUMIN, URINE WAIVED
Creatinine, Urine Waived: 100 mg/dL (ref 10–300)
Microalb, Ur Waived: 80 mg/L — ABNORMAL HIGH (ref 0–19)
Microalb/Creat Ratio: <30 mg/g (ref <30)

## 2024-03-03 NOTE — Telephone Encounter (Signed)
 I called the wife and let her know that he needs to come over cancer center and he does not want to come in today. He will come in tom. With labs and see [facility] and the pt wants it in afternoon, and person will call with the apps for tom.I also told the wife that he needs to stop the eliquis for now. Wife sill stop it this evening until a MD says to go back on. She understands

## 2024-03-03 NOTE — Telephone Encounter (Signed)
 Then she called again that when she got home he was having dark black stools and she sees blood and what to do

## 2024-03-04 ENCOUNTER — Encounter: Payer: Self-pay | Admitting: Internal Medicine

## 2024-03-04 ENCOUNTER — Inpatient Hospital Stay (HOSPITAL_BASED_OUTPATIENT_CLINIC_OR_DEPARTMENT_OTHER): Admitting: Hospice and Palliative Medicine

## 2024-03-04 ENCOUNTER — Other Ambulatory Visit: Payer: Self-pay

## 2024-03-04 ENCOUNTER — Encounter: Payer: Self-pay | Admitting: Hospice and Palliative Medicine

## 2024-03-04 ENCOUNTER — Inpatient Hospital Stay

## 2024-03-04 ENCOUNTER — Encounter: Payer: Self-pay | Admitting: Emergency Medicine

## 2024-03-04 ENCOUNTER — Inpatient Hospital Stay
Admission: EM | Admit: 2024-03-04 | Discharge: 2024-04-10 | DRG: 377 | Disposition: E | Source: Ambulatory Visit | Attending: Student in an Organized Health Care Education/Training Program | Admitting: Student in an Organized Health Care Education/Training Program

## 2024-03-04 VITALS — BP 101/58 | HR 70 | Temp 97.3°F | Resp 12

## 2024-03-04 DIAGNOSIS — J440 Chronic obstructive pulmonary disease with acute lower respiratory infection: Secondary | ICD-10-CM | POA: Diagnosis present

## 2024-03-04 DIAGNOSIS — I110 Hypertensive heart disease with heart failure: Secondary | ICD-10-CM | POA: Diagnosis present

## 2024-03-04 DIAGNOSIS — Z95828 Presence of other vascular implants and grafts: Secondary | ICD-10-CM

## 2024-03-04 DIAGNOSIS — Z4682 Encounter for fitting and adjustment of non-vascular catheter: Secondary | ICD-10-CM | POA: Diagnosis not present

## 2024-03-04 DIAGNOSIS — C8330 Diffuse large B-cell lymphoma, unspecified site: Secondary | ICD-10-CM | POA: Diagnosis not present

## 2024-03-04 DIAGNOSIS — E871 Hypo-osmolality and hyponatremia: Secondary | ICD-10-CM | POA: Diagnosis present

## 2024-03-04 DIAGNOSIS — K625 Hemorrhage of anus and rectum: Secondary | ICD-10-CM | POA: Diagnosis not present

## 2024-03-04 DIAGNOSIS — F419 Anxiety disorder, unspecified: Secondary | ICD-10-CM | POA: Diagnosis present

## 2024-03-04 DIAGNOSIS — M7989 Other specified soft tissue disorders: Secondary | ICD-10-CM | POA: Diagnosis not present

## 2024-03-04 DIAGNOSIS — J189 Pneumonia, unspecified organism: Secondary | ICD-10-CM | POA: Diagnosis present

## 2024-03-04 DIAGNOSIS — K319 Disease of stomach and duodenum, unspecified: Secondary | ICD-10-CM | POA: Diagnosis not present

## 2024-03-04 DIAGNOSIS — Z9081 Acquired absence of spleen: Secondary | ICD-10-CM | POA: Diagnosis not present

## 2024-03-04 DIAGNOSIS — T45515A Adverse effect of anticoagulants, initial encounter: Secondary | ICD-10-CM | POA: Diagnosis present

## 2024-03-04 DIAGNOSIS — K219 Gastro-esophageal reflux disease without esophagitis: Secondary | ICD-10-CM | POA: Diagnosis present

## 2024-03-04 DIAGNOSIS — Z515 Encounter for palliative care: Secondary | ICD-10-CM | POA: Diagnosis not present

## 2024-03-04 DIAGNOSIS — D62 Acute posthemorrhagic anemia: Secondary | ICD-10-CM | POA: Diagnosis present

## 2024-03-04 DIAGNOSIS — K922 Gastrointestinal hemorrhage, unspecified: Principal | ICD-10-CM | POA: Diagnosis present

## 2024-03-04 DIAGNOSIS — R0602 Shortness of breath: Secondary | ICD-10-CM | POA: Diagnosis not present

## 2024-03-04 DIAGNOSIS — F32A Depression, unspecified: Secondary | ICD-10-CM | POA: Diagnosis present

## 2024-03-04 DIAGNOSIS — A419 Sepsis, unspecified organism: Secondary | ICD-10-CM | POA: Diagnosis not present

## 2024-03-04 DIAGNOSIS — Z7901 Long term (current) use of anticoagulants: Secondary | ICD-10-CM

## 2024-03-04 DIAGNOSIS — R600 Localized edema: Secondary | ICD-10-CM | POA: Diagnosis not present

## 2024-03-04 DIAGNOSIS — R55 Syncope and collapse: Secondary | ICD-10-CM | POA: Diagnosis not present

## 2024-03-04 DIAGNOSIS — C8338 Diffuse large B-cell lymphoma, lymph nodes of multiple sites: Secondary | ICD-10-CM | POA: Diagnosis not present

## 2024-03-04 DIAGNOSIS — Z66 Do not resuscitate: Secondary | ICD-10-CM | POA: Diagnosis present

## 2024-03-04 DIAGNOSIS — Z8572 Personal history of non-Hodgkin lymphomas: Secondary | ICD-10-CM | POA: Diagnosis not present

## 2024-03-04 DIAGNOSIS — K264 Chronic or unspecified duodenal ulcer with hemorrhage: Principal | ICD-10-CM | POA: Diagnosis present

## 2024-03-04 DIAGNOSIS — C8333 Diffuse large B-cell lymphoma, intra-abdominal lymph nodes: Secondary | ICD-10-CM

## 2024-03-04 DIAGNOSIS — K229 Disease of esophagus, unspecified: Secondary | ICD-10-CM | POA: Diagnosis present

## 2024-03-04 DIAGNOSIS — D709 Neutropenia, unspecified: Secondary | ICD-10-CM | POA: Diagnosis present

## 2024-03-04 DIAGNOSIS — R6521 Severe sepsis with septic shock: Secondary | ICD-10-CM | POA: Diagnosis not present

## 2024-03-04 DIAGNOSIS — I739 Peripheral vascular disease, unspecified: Secondary | ICD-10-CM | POA: Diagnosis present

## 2024-03-04 DIAGNOSIS — D6832 Hemorrhagic disorder due to extrinsic circulating anticoagulants: Secondary | ICD-10-CM | POA: Diagnosis present

## 2024-03-04 DIAGNOSIS — I5032 Chronic diastolic (congestive) heart failure: Secondary | ICD-10-CM | POA: Diagnosis not present

## 2024-03-04 DIAGNOSIS — Y95 Nosocomial condition: Secondary | ICD-10-CM | POA: Diagnosis present

## 2024-03-04 DIAGNOSIS — Z955 Presence of coronary angioplasty implant and graft: Secondary | ICD-10-CM

## 2024-03-04 DIAGNOSIS — Z83438 Family history of other disorder of lipoprotein metabolism and other lipidemia: Secondary | ICD-10-CM

## 2024-03-04 DIAGNOSIS — Z1152 Encounter for screening for COVID-19: Secondary | ICD-10-CM | POA: Diagnosis not present

## 2024-03-04 DIAGNOSIS — I672 Cerebral atherosclerosis: Secondary | ICD-10-CM | POA: Diagnosis not present

## 2024-03-04 DIAGNOSIS — I959 Hypotension, unspecified: Secondary | ICD-10-CM | POA: Diagnosis not present

## 2024-03-04 DIAGNOSIS — K2270 Barrett's esophagus without dysplasia: Secondary | ICD-10-CM | POA: Diagnosis not present

## 2024-03-04 DIAGNOSIS — I25118 Atherosclerotic heart disease of native coronary artery with other forms of angina pectoris: Secondary | ICD-10-CM | POA: Diagnosis not present

## 2024-03-04 DIAGNOSIS — Z8249 Family history of ischemic heart disease and other diseases of the circulatory system: Secondary | ICD-10-CM

## 2024-03-04 DIAGNOSIS — D649 Anemia, unspecified: Secondary | ICD-10-CM

## 2024-03-04 DIAGNOSIS — M109 Gout, unspecified: Secondary | ICD-10-CM | POA: Diagnosis present

## 2024-03-04 DIAGNOSIS — R0989 Other specified symptoms and signs involving the circulatory and respiratory systems: Secondary | ICD-10-CM | POA: Diagnosis not present

## 2024-03-04 DIAGNOSIS — J439 Emphysema, unspecified: Secondary | ICD-10-CM | POA: Diagnosis present

## 2024-03-04 DIAGNOSIS — K921 Melena: Secondary | ICD-10-CM | POA: Diagnosis not present

## 2024-03-04 DIAGNOSIS — K269 Duodenal ulcer, unspecified as acute or chronic, without hemorrhage or perforation: Secondary | ICD-10-CM

## 2024-03-04 DIAGNOSIS — G9340 Encephalopathy, unspecified: Secondary | ICD-10-CM | POA: Diagnosis present

## 2024-03-04 DIAGNOSIS — R7303 Prediabetes: Secondary | ICD-10-CM | POA: Diagnosis present

## 2024-03-04 DIAGNOSIS — R918 Other nonspecific abnormal finding of lung field: Secondary | ICD-10-CM | POA: Diagnosis not present

## 2024-03-04 DIAGNOSIS — I6523 Occlusion and stenosis of bilateral carotid arteries: Secondary | ICD-10-CM | POA: Diagnosis not present

## 2024-03-04 DIAGNOSIS — N179 Acute kidney failure, unspecified: Secondary | ICD-10-CM

## 2024-03-04 DIAGNOSIS — R579 Shock, unspecified: Secondary | ICD-10-CM

## 2024-03-04 DIAGNOSIS — J9601 Acute respiratory failure with hypoxia: Secondary | ICD-10-CM | POA: Diagnosis not present

## 2024-03-04 DIAGNOSIS — R5081 Fever presenting with conditions classified elsewhere: Secondary | ICD-10-CM | POA: Diagnosis present

## 2024-03-04 DIAGNOSIS — E66811 Obesity, class 1: Secondary | ICD-10-CM | POA: Diagnosis present

## 2024-03-04 DIAGNOSIS — B379 Candidiasis, unspecified: Secondary | ICD-10-CM | POA: Diagnosis not present

## 2024-03-04 DIAGNOSIS — N400 Enlarged prostate without lower urinary tract symptoms: Secondary | ICD-10-CM | POA: Diagnosis present

## 2024-03-04 DIAGNOSIS — K3189 Other diseases of stomach and duodenum: Secondary | ICD-10-CM | POA: Diagnosis present

## 2024-03-04 DIAGNOSIS — I10 Essential (primary) hypertension: Secondary | ICD-10-CM | POA: Diagnosis not present

## 2024-03-04 DIAGNOSIS — K254 Chronic or unspecified gastric ulcer with hemorrhage: Secondary | ICD-10-CM | POA: Diagnosis present

## 2024-03-04 DIAGNOSIS — R591 Generalized enlarged lymph nodes: Secondary | ICD-10-CM | POA: Diagnosis present

## 2024-03-04 DIAGNOSIS — I480 Paroxysmal atrial fibrillation: Secondary | ICD-10-CM | POA: Diagnosis present

## 2024-03-04 DIAGNOSIS — C884 Extranodal marginal zone b-cell lymphoma of mucosa-associated lymphoid tissue (malt-lymphoma) not having achieved remission: Secondary | ICD-10-CM | POA: Diagnosis present

## 2024-03-04 DIAGNOSIS — R0902 Hypoxemia: Secondary | ICD-10-CM | POA: Diagnosis not present

## 2024-03-04 DIAGNOSIS — Z6835 Body mass index (BMI) 35.0-35.9, adult: Secondary | ICD-10-CM

## 2024-03-04 DIAGNOSIS — K259 Gastric ulcer, unspecified as acute or chronic, without hemorrhage or perforation: Secondary | ICD-10-CM | POA: Diagnosis not present

## 2024-03-04 DIAGNOSIS — C8590 Non-Hodgkin lymphoma, unspecified, unspecified site: Secondary | ICD-10-CM | POA: Diagnosis not present

## 2024-03-04 DIAGNOSIS — B3781 Candidal esophagitis: Secondary | ICD-10-CM | POA: Diagnosis not present

## 2024-03-04 DIAGNOSIS — I4891 Unspecified atrial fibrillation: Secondary | ICD-10-CM | POA: Diagnosis not present

## 2024-03-04 DIAGNOSIS — R590 Localized enlarged lymph nodes: Secondary | ICD-10-CM | POA: Diagnosis not present

## 2024-03-04 DIAGNOSIS — Z7902 Long term (current) use of antithrombotics/antiplatelets: Secondary | ICD-10-CM

## 2024-03-04 DIAGNOSIS — Z800 Family history of malignant neoplasm of digestive organs: Secondary | ICD-10-CM

## 2024-03-04 DIAGNOSIS — Z79899 Other long term (current) drug therapy: Secondary | ICD-10-CM

## 2024-03-04 LAB — CBC WITH DIFFERENTIAL (CANCER CENTER ONLY)
Abs Immature Granulocytes: 0.17 10*3/uL — ABNORMAL HIGH (ref 0.00–0.07)
Basophils Absolute: 0 10*3/uL (ref 0.0–0.1)
Basophils Relative: 1 %
Eosinophils Absolute: 0.1 10*3/uL (ref 0.0–0.5)
Eosinophils Relative: 2 %
HCT: 19.8 % — ABNORMAL LOW (ref 39.0–52.0)
Hemoglobin: 6.5 g/dL — CL (ref 13.0–17.0)
Immature Granulocytes: 2 %
Lymphocytes Relative: 8 %
Lymphs Abs: 0.6 10*3/uL — ABNORMAL LOW (ref 0.7–4.0)
MCH: 36.3 pg — ABNORMAL HIGH (ref 26.0–34.0)
MCHC: 32.8 g/dL (ref 30.0–36.0)
MCV: 110.6 fL — ABNORMAL HIGH (ref 80.0–100.0)
Monocytes Absolute: 0.6 10*3/uL (ref 0.1–1.0)
Monocytes Relative: 7 %
Neutro Abs: 6.8 10*3/uL (ref 1.7–7.7)
Neutrophils Relative %: 80 %
Platelet Count: 386 10*3/uL (ref 150–400)
RBC: 1.79 MIL/uL — ABNORMAL LOW (ref 4.22–5.81)
RDW: 16.3 % — ABNORMAL HIGH (ref 11.5–15.5)
Smear Review: NORMAL
WBC Count: 8.3 10*3/uL (ref 4.0–10.5)
nRBC: 18.3 % — ABNORMAL HIGH (ref 0.0–0.2)

## 2024-03-04 LAB — CMP (CANCER CENTER ONLY)
ALT: 23 U/L (ref 0–44)
AST: 50 U/L — ABNORMAL HIGH (ref 15–41)
Albumin: 2.6 g/dL — ABNORMAL LOW (ref 3.5–5.0)
Alkaline Phosphatase: 55 U/L (ref 38–126)
Anion gap: 14 (ref 5–15)
BUN: 33 mg/dL — ABNORMAL HIGH (ref 8–23)
CO2: 22 mmol/L (ref 22–32)
Calcium: 8.8 mg/dL — ABNORMAL LOW (ref 8.9–10.3)
Chloride: 93 mmol/L — ABNORMAL LOW (ref 98–111)
Creatinine: 1.44 mg/dL — ABNORMAL HIGH (ref 0.61–1.24)
GFR, Estimated: 49 mL/min — ABNORMAL LOW (ref >60)
Glucose, Bld: 128 mg/dL — ABNORMAL HIGH (ref 70–99)
Potassium: 4.6 mmol/L (ref 3.5–5.1)
Sodium: 129 mmol/L — ABNORMAL LOW (ref 135–145)
Total Bilirubin: 0.7 mg/dL (ref 0.0–1.2)
Total Protein: 5.6 g/dL — ABNORMAL LOW (ref 6.5–8.1)

## 2024-03-04 LAB — CBC WITH DIFFERENTIAL/PLATELET
Abs Immature Granulocytes: 0.17 10*3/uL — ABNORMAL HIGH (ref 0.00–0.07)
Basophils Absolute: 0.1 10*3/uL (ref 0.0–0.1)
Basophils Relative: 1 %
Eosinophils Absolute: 0.1 10*3/uL (ref 0.0–0.5)
Eosinophils Relative: 1 %
HCT: 19.8 % — ABNORMAL LOW (ref 39.0–52.0)
Hemoglobin: 6.5 g/dL — ABNORMAL LOW (ref 13.0–17.0)
Immature Granulocytes: 2 %
Lymphocytes Relative: 7 %
Lymphs Abs: 0.6 10*3/uL — ABNORMAL LOW (ref 0.7–4.0)
MCH: 36.1 pg — ABNORMAL HIGH (ref 26.0–34.0)
MCHC: 32.8 g/dL (ref 30.0–36.0)
MCV: 110 fL — ABNORMAL HIGH (ref 80.0–100.0)
Monocytes Absolute: 0.7 10*3/uL (ref 0.1–1.0)
Monocytes Relative: 8 %
Neutro Abs: 6.9 10*3/uL (ref 1.7–7.7)
Neutrophils Relative %: 81 %
Platelets: 381 10*3/uL (ref 150–400)
RBC: 1.8 MIL/uL — ABNORMAL LOW (ref 4.22–5.81)
RDW: 16.7 % — ABNORMAL HIGH (ref 11.5–15.5)
Smear Review: NORMAL
WBC: 8.6 10*3/uL (ref 4.0–10.5)
nRBC: 19.7 % — ABNORMAL HIGH (ref 0.0–0.2)

## 2024-03-04 LAB — RETICULOCYTES
Immature Retic Fract: 50.8 % — ABNORMAL HIGH (ref 2.3–15.9)
RBC.: 1.79 MIL/uL — ABNORMAL LOW (ref 4.22–5.81)
Retic Count, Absolute: 142.1 10*3/uL (ref 19.0–186.0)
Retic Ct Pct: 7.9 % — ABNORMAL HIGH (ref 0.4–3.1)

## 2024-03-04 LAB — PROTIME-INR
INR: 1.6 — ABNORMAL HIGH (ref 0.8–1.2)
Prothrombin Time: 18.9 s — ABNORMAL HIGH (ref 11.4–15.2)

## 2024-03-04 LAB — LACTATE DEHYDROGENASE: LDH: 695 U/L — ABNORMAL HIGH (ref 98–192)

## 2024-03-04 LAB — APTT: aPTT: 30 s (ref 24–36)

## 2024-03-04 LAB — SAMPLE TO BLOOD BANK

## 2024-03-04 LAB — ABO/RH: ABO/RH(D): A POS

## 2024-03-04 LAB — PREPARE RBC (CROSSMATCH)

## 2024-03-04 MED ORDER — HYDROCODONE-ACETAMINOPHEN 5-325 MG PO TABS
1.0000 | ORAL_TABLET | Freq: Four times a day (QID) | ORAL | Status: DC | PRN
  Administered 2024-03-04: 1 via ORAL
  Administered 2024-03-05 (×3): 2 via ORAL
  Administered 2024-03-05: 1 via ORAL
  Administered 2024-03-06 – 2024-03-10 (×11): 2 via ORAL
  Administered 2024-03-11 (×2): 1 via ORAL
  Administered 2024-03-12 – 2024-03-13 (×3): 2 via ORAL
  Filled 2024-03-04 (×8): qty 2
  Filled 2024-03-04: qty 1
  Filled 2024-03-04 (×3): qty 2
  Filled 2024-03-04: qty 1
  Filled 2024-03-04 (×9): qty 2

## 2024-03-04 MED ORDER — ACETAMINOPHEN 650 MG RE SUPP: 650.0000 mg | Freq: Four times a day (QID) | RECTAL | Status: DC | PRN

## 2024-03-04 MED ORDER — PANTOPRAZOLE SODIUM 40 MG IV SOLR
80.0000 mg | Freq: Once | INTRAVENOUS | Status: AC
  Administered 2024-03-04: 80 mg via INTRAVENOUS
  Filled 2024-03-04: qty 20

## 2024-03-04 MED ORDER — ONDANSETRON HCL 4 MG/2ML IJ SOLN
4.0000 mg | Freq: Once | INTRAMUSCULAR | Status: AC
  Administered 2024-03-04: 4 mg via INTRAVENOUS
  Filled 2024-03-04: qty 2

## 2024-03-04 MED ORDER — ACETAMINOPHEN 325 MG PO TABS
650.0000 mg | ORAL_TABLET | Freq: Four times a day (QID) | ORAL | Status: DC | PRN
  Administered 2024-03-04: 650 mg via ORAL
  Filled 2024-03-04: qty 2

## 2024-03-04 MED ORDER — PANTOPRAZOLE SODIUM 40 MG IV SOLR
40.0000 mg | Freq: Two times a day (BID) | INTRAVENOUS | Status: DC
  Administered 2024-03-04: 40 mg via INTRAVENOUS
  Filled 2024-03-04: qty 10

## 2024-03-04 MED ORDER — SODIUM CHLORIDE 0.9 % IV SOLN
10.0000 mL/h | Freq: Once | INTRAVENOUS | Status: AC
  Administered 2024-03-07: 10 mL/h via INTRAVENOUS

## 2024-03-04 MED ORDER — ONDANSETRON HCL 4 MG/2ML IJ SOLN
4.0000 mg | Freq: Four times a day (QID) | INTRAMUSCULAR | Status: DC | PRN
Start: 2024-03-04 — End: 2024-03-14
  Administered 2024-03-09 – 2024-03-13 (×2): 4 mg via INTRAVENOUS
  Filled 2024-03-04 (×3): qty 2

## 2024-03-04 MED ORDER — ATORVASTATIN CALCIUM 20 MG PO TABS
40.0000 mg | ORAL_TABLET | Freq: Every day | ORAL | Status: DC
  Administered 2024-03-05 – 2024-03-13 (×9): 40 mg via ORAL
  Filled 2024-03-04 (×9): qty 2

## 2024-03-04 MED ORDER — SODIUM CHLORIDE 0.9% FLUSH
3.0000 mL | Freq: Two times a day (BID) | INTRAVENOUS | Status: DC
  Administered 2024-03-04 – 2024-03-14 (×20): 3 mL via INTRAVENOUS

## 2024-03-04 MED ORDER — LACTATED RINGERS IV BOLUS
500.0000 mL | Freq: Once | INTRAVENOUS | Status: AC
Start: 2024-03-04 — End: 2024-03-04
  Administered 2024-03-04: 500 mL via INTRAVENOUS

## 2024-03-04 MED ORDER — ALLOPURINOL 300 MG PO TABS
300.0000 mg | ORAL_TABLET | Freq: Two times a day (BID) | ORAL | Status: DC
Start: 2024-03-05 — End: 2024-03-14
  Administered 2024-03-05 – 2024-03-14 (×18): 300 mg via ORAL
  Filled 2024-03-04 (×6): qty 3
  Filled 2024-03-04: qty 1
  Filled 2024-03-04 (×12): qty 3

## 2024-03-04 MED ORDER — METOPROLOL TARTRATE 50 MG PO TABS
50.0000 mg | ORAL_TABLET | Freq: Two times a day (BID) | ORAL | Status: DC
  Administered 2024-03-05 – 2024-03-10 (×10): 50 mg via ORAL
  Filled 2024-03-04 (×11): qty 1

## 2024-03-04 MED ORDER — ONDANSETRON HCL 4 MG PO TABS
4.0000 mg | ORAL_TABLET | Freq: Four times a day (QID) | ORAL | Status: DC | PRN
Start: 2024-03-04 — End: 2024-03-14
  Administered 2024-03-06 – 2024-03-09 (×2): 4 mg via ORAL
  Filled 2024-03-04 (×2): qty 1

## 2024-03-04 MED ORDER — ACYCLOVIR 200 MG PO CAPS
400.0000 mg | ORAL_CAPSULE | Freq: Two times a day (BID) | ORAL | Status: DC
  Administered 2024-03-05 – 2024-03-14 (×19): 400 mg via ORAL
  Filled 2024-03-04 (×19): qty 2

## 2024-03-04 MED ORDER — AMIODARONE HCL 200 MG PO TABS
200.0000 mg | ORAL_TABLET | Freq: Every day | ORAL | Status: DC
  Administered 2024-03-05 – 2024-03-13 (×9): 200 mg via ORAL
  Filled 2024-03-04 (×9): qty 1

## 2024-03-04 NOTE — H&P (Signed)
 History and Physical    Patient: Jesse Norton:[id number] DOB: 25-Nov-1946 DOA: 03/04/2024 DOS: the patient was seen and examined on 03/04/2024 PCP: Marjie Skiff, NP  Patient coming from: Home  Chief Complaint:  Chief Complaint  Patient presents with   Rectal Bleeding   HPI: Jesse Norton is a 78 y.o. 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  [name] states that he has been experiencing black stool for several days now and that it began prior to his discharge from his recent hospitalization for atrial fibrillation.  His wife at bedside states she did not notice the melena until yesterday.  Since being discharged home, he has noted worsening generalized weakness with dizziness, and worsening dyspnea on exertion.  Per chart review, patient was recently admitted for atrial fibrillation with RVR, new diagnosis.  He was treated with amiodarone, metoprolol, and Eliquis.  During this admission, he was also treated for CAP with azithromycin and ceftriaxone.  ED course: On arrival to the ED, patient was normotensive at 112/59 with heart rate of 71.  He was saturating at 93% on room air.  He was afebrile at 97.8.  Initial workup notable for hemoglobin of 6.5, MCV of 110, creatinine 1.44, sodium 129, AST 50, GFR 49.  INR 1.6.  2 units of packed RBCs ordered and patient started on Protonix.  [facility] contacted for admission.   Review of Systems: As mentioned in the history of present illness. All other systems reviewed and are negative.  Past Medical History:  Diagnosis Date   (HFpEF) heart failure with preserved ejection fraction (HCC) 01/21/2019   a.) LHC 01/21/2019: EF 55-60%, LVEDP 25-30 mmHg   Anxiety    Aortic atherosclerosis (HCC)    Ascending aorta  dilatation (HCC) 07/16/2023   a.) TTE 07/16/2023: 38 mm   Barrett's esophagus    BPH (benign prostatic hyperplasia)    CAD (coronary artery disease) 10/2018   a.) ETT 11/06/2018: 1mm horizontal inflat ST depression, freq PVC's -> intermediate risk; b.) cCTA 12/23/2018: Ca2+ 2408 (93rd %'ile); FFR 0.6 mLAD, 0.87 mLCx, 0.77 dLCx --> cath recommended; c.) LHC/PCI 01/01/2019:  40% pLAD, 85% mLAD, 70% D2, 70% pLCx (2.75 x 15 mm Resolute Onyx DES), RCA min irregs -- staged PCI; d.) staged PCI 01/21/2019: orbital athrectomy + 3.0 x 15 mm Resolute Onyx DES mLAD   Chronic heart failure with preserved ejection fraction (HCC)    Colon polyps    Duodenal adenoma    Gastritis    GERD (gastroesophageal reflux disease)    Gout    H. pylori infection    H/O urethral stricture    Hemorrhoids    Hiatal hernia    History of bilateral cataract extraction 2023   History of splenectomy 1957   Hypertension    Mucosa-associated lymphoid tissue (MALT) lymphoma    Pre-diabetes    Pulmonary emphysema (HCC)    Stomach ulcer    Past Surgical History:  Procedure Laterality Date   CATARACT EXTRACTION W/PHACO Left 03/07/2022   Procedure: CATARACT EXTRACTION PHACO AND INTRAOCULAR LENS PLACEMENT (IOC) LEFT VIVITY TORIC LENS 10.41 00:55.7;  Surgeon: Galen Manila, MD;  Location: [facility];  Service: Ophthalmology;  Laterality: Left;   CATARACT EXTRACTION W/PHACO Right 03/28/2022   Procedure: CATARACT EXTRACTION PHACO AND INTRAOCULAR LENS PLACEMENT (IOC)  RIGHT VIVITY LENS 6.68 00:50.0;  Surgeon: Galen Manila, MD;  Location: [facility];  Service: Ophthalmology;  Laterality: Right;   COLONOSCOPY     COLONOSCOPY N/A 06/19/2022   Procedure: COLONOSCOPY;  Surgeon: Jaynie Collins, DO;  Location: [facility] ENDOSCOPY;  Service: Gastroenterology;  Laterality: N/A;   COLONOSCOPY WITH PROPOFOL N/A 08/20/2018   Procedure: COLONOSCOPY WITH PROPOFOL;  Surgeon: Christena Deem, MD;  Location: [facility]  ENDOSCOPY;  Service: Endoscopy;  Laterality: N/A;   COLONOSCOPY WITH PROPOFOL N/A 12/19/2018   Procedure: COLONOSCOPY WITH PROPOFOL;  Surgeon: Christena Deem, MD;  Location: [facility] ENDOSCOPY;  Service: Endoscopy;  Laterality: N/A;   CORONARY ATHERECTOMY N/A 02/03/2019   Procedure: CORONARY ATHERECTOMY;  Surgeon: Yvonne Kendall, MD;  Location: [facility] INVASIVE CV LAB;  Service: Cardiovascular;  Laterality: N/A;   CORONARY STENT INTERVENTION N/A 01/21/2019   Procedure: CORONARY STENT INTERVENTION;  Surgeon: Yvonne Kendall, MD;  Location: [facility] INVASIVE CV LAB;  Service: Cardiovascular;  Laterality: N/A;   CORONARY STENT INTERVENTION N/A 02/03/2019   Procedure: CORONARY STENT INTERVENTION;  Surgeon: Yvonne Kendall, MD;  Location: [facility] INVASIVE CV LAB;  Service: Cardiovascular;  Laterality: N/A;   CORONARY ULTRASOUND/IVUS N/A 02/03/2019   Procedure: Intravascular Ultrasound/IVUS;  Surgeon: Yvonne Kendall, MD;  Location: [facility] INVASIVE CV LAB;  Service: Cardiovascular;  Laterality: N/A;   CYSTOSCOPY WITH URETHRAL DILATATION N/A 08/16/2018   Procedure: CYSTOSCOPY WITH URETHRAL DILATATION;  Surgeon: Sondra Come, MD;  Location: [facility] ORS;  Service: Urology;  Laterality: N/A;   ESOPHAGOGASTRODUODENOSCOPY N/A 01/29/2017   Procedure: ESOPHAGOGASTRODUODENOSCOPY (EGD);  Surgeon: Christena Deem, MD;  Location: [facility] ENDOSCOPY;  Service: Endoscopy;  Laterality: N/A;   ESOPHAGOGASTRODUODENOSCOPY N/A 06/19/2022   Procedure: ESOPHAGOGASTRODUODENOSCOPY (EGD);  Surgeon: Jaynie Collins, DO;  Location: [facility];  Service: Gastroenterology;  Laterality: N/A;   ESOPHAGOGASTRODUODENOSCOPY (EGD) WITH PROPOFOL N/A 07/10/2016   Procedure: ESOPHAGOGASTRODUODENOSCOPY (EGD) WITH PROPOFOL;  Surgeon: Christena Deem, MD;  Location: [facility] ENDOSCOPY;  Service: Endoscopy;  Laterality: N/A;   ESOPHAGOGASTRODUODENOSCOPY (EGD) WITH PROPOFOL N/A 12/19/2018   Procedure: ESOPHAGOGASTRODUODENOSCOPY (EGD) WITH PROPOFOL;   Surgeon: Christena Deem, MD;  Location: [facility] ENDOSCOPY;  Service: Endoscopy;  Laterality: N/A;   left elbow repair Left    LEFT HEART CATH AND CORONARY ANGIOGRAPHY Left 01/21/2019   Procedure: LEFT HEART CATH AND CORONARY ANGIOGRAPHY;  Surgeon: Yvonne Kendall, MD;  Location: [facility] INVASIVE CV LAB;  Service: Cardiovascular;  Laterality: Left;   LYMPH NODE DISSECTION Left 02/08/2024   Procedure: LYMPH NODE DISSECTION;  Surgeon: Carolan Shiver, MD;  Location: [facility] ORS;  Service: General;  Laterality: Left;   PORTACATH PLACEMENT N/A 02/13/2024   Procedure: INSERTION, TUNNELED CENTRAL VENOUS DEVICE, WITH PORT;  Surgeon: Carolan Shiver, MD;  Location: [facility] ORS;  Service: General;  Laterality: N/A;   SPLENECTOMY  1957   TONSILLECTOMY     as a child   Social History:  reports that he has never smoked. He has never used smokeless tobacco. He reports that he does not currently use alcohol. He reports that he does not use drugs.  No Known Allergies  Family History  Adopted: Yes  Problem Relation Age of Onset   Heart attack Mother 18   Pancreatic cancer Father        died in WWII - pt adopted   Hypertension Sister    Hyperlipidemia Sister    Hypertension Brother    Hyperlipidemia Brother    Prostate cancer Neg Hx    Kidney cancer Neg Hx    Bladder  Cancer Neg Hx     Prior to Admission medications   Medication Sig Start Date End Date Taking? Authorizing Provider  acyclovir (ZOVIRAX) 400 MG tablet Take 1 tablet (400 mg total) by mouth 2 (two) times daily. 02/19/24   Earna Coder, MD  allopurinol (ZYLOPRIM) 300 MG tablet Take 1 tablet (300 mg total) by mouth 2 (two) times daily. 02/11/24   Earna Coder, MD  amiodarone (PACERONE) 200 MG tablet Take 2 tablets (400 mg total) by mouth 2 (two) times daily for 5 days, THEN 1 tablet (200 mg total) daily. 02/29/24 04/04/24  Pennie Banter, DO  apixaban (ELIQUIS) 5 MG TABS tablet Take 1 tablet (5 mg total) by mouth 2  (two) times daily. Patient not taking: Reported on 03/04/2024 02/29/24   Esaw Grandchild A, DO  atorvastatin (LIPITOR) 40 MG tablet TAKE 1 TABLET(40 MG) BY MOUTH DAILY 01/26/23   Aura Dials T, NP  azithromycin (ZITHROMAX) 250 MG tablet Take one tablet (500 mg) by mouth once daily for 2 days Patient not taking: Reported on 03/04/2024 03/01/24   Esaw Grandchild A, DO  Blood Glucose Monitoring Suppl (ONETOUCH VERIO) w/Device KIT To check blood sugar twice a day with goal fasting in morning <130 and goal 2 hours after meal <180.  Write all checks down for provider visits. 02/11/24   Cannady, Corrie Dandy T, NP  clobetasol ointment (TEMOVATE) 0.05 % Apply topically as needed. 01/20/22   [provider]  feeding supplement (ENSURE ENLIVE / ENSURE PLUS) LIQD Take 237 mLs by mouth 2 (two) times daily between meals. 02/29/24   Pennie Banter, DO  FLUoxetine (PROZAC) 10 MG tablet Take 1 tablet (10 mg total) by mouth daily. 02/26/24   Cannady, Corrie Dandy T, NP  furosemide (LASIX) 20 MG tablet Take 1 tablet (20 mg total) by mouth daily. 03/01/24   Esaw Grandchild A, DO  glucose blood test strip To check blood sugar twice a day with goal fasting in morning <130 and goal 2 hours after meal <180.  Write all checks down for provider visits. 02/11/24   Cannady, Corrie Dandy T, NP  HYDROcodone-acetaminophen (NORCO/VICODIN) 5-325 MG tablet Take 1 tablet by mouth every 6 (six) hours as needed for up to 10 days. 02/26/24 03/07/24  Aura Dials T, NP  Lancets (ONETOUCH ULTRASOFT) lancets 1 each by Other route 2 (two) times daily. 02/13/24   Cannady, Corrie Dandy T, NP  lidocaine (LIDODERM) 5 % Place 1 patch onto the skin daily. Remove & Discard patch within 12 hours or as directed by MD 02/26/24   Aura Dials T, NP  lidocaine-prilocaine (EMLA) cream Apply on the port. 30 -45 min  prior to port access. 02/11/24   Earna Coder, MD  metoprolol tartrate (LOPRESSOR) 50 MG tablet Take 1 tablet (50 mg total) by mouth 2 (two) times daily.  02/29/24   Pennie Banter, DO  nitroGLYCERIN (NITROSTAT) 0.4 MG SL tablet Place 1 tablet (0.4 mg total) under the tongue every 5 (five) minutes as needed for chest pain. Maximum of 3 doses. 08/25/20   End, Cristal Deer, MD  ondansetron (ZOFRAN) 8 MG tablet One pill every 8 hours as needed for nausea/vomitting. 02/11/24   Earna Coder, MD  oxyCODONE (OXY IR/ROXICODONE) 5 MG immediate release tablet Take 1 tablet (5 mg total) by mouth every 4 (four) hours as needed for severe pain (pain score 7-10). 02/29/24   Pennie Banter, DO  prochlorperazine (COMPAZINE) 10 MG tablet Take 1 tablet (10 mg total) by mouth  every 6 (six) hours as needed for nausea or vomiting. 02/11/24   Earna Coder, MD    Physical Exam: Vitals:   03/04/24 2018 03/04/24 2037 03/04/24 2052 03/04/24 2054  BP: (!) 98/53 (!) 100/58 (!) 113/56 (!) 113/56  Pulse: 73   74  Resp: 16  20 20   Temp: 97.6 F (36.4 C) 98.2 F (36.8 C) 97.7 F (36.5 C) 97.7 F (36.5 C)  TempSrc:  Oral Oral   SpO2: 97% 98% 99% 97%  Weight:      Height:       Physical Exam Vitals and nursing note reviewed.  Constitutional:      General: He is not in acute distress.    Appearance: He is obese.  HENT:     Mouth/Throat:     Mouth: Mucous membranes are moist.     Pharynx: Oropharynx is clear.  Cardiovascular:     Rate and Rhythm: Normal rate and regular rhythm.     Heart sounds: No murmur heard. Pulmonary:     Effort: Pulmonary effort is normal. No respiratory distress.     Breath sounds: Normal breath sounds. No wheezing or rales.  Abdominal:     General: Bowel sounds are normal. There is no distension.     Palpations: Abdomen is soft.     Tenderness: There is no abdominal tenderness. There is no guarding.  Musculoskeletal:     Right lower leg: Edema (2+ pitting edema, notably larger than left) present.     Left lower leg: Edema (1+ pitting edema) present.  Skin:    Coloration: Skin is pale.     Findings: Bruising (Right  upper arm) present.  Neurological:     General: No focal deficit present.     Mental Status: He is alert and oriented to person, place, and time. Mental status is at baseline.  Psychiatric:        Mood and Affect: Mood normal.        Behavior: Behavior normal.    Data Reviewed: CBC with WBC of 8.6, hemoglobin of 6.5, platelets of 381.  MCV 110. CMP with sodium of 129, potassium 4.6, bicarb 22, glucose 128, BUN 33, creat 1.44, AST 50, ALT 23, GFR 49 LDH 695 INR 1.6 EKG personally reviewed.  Sinus rhythm with rate of 72.  No acute ischemic changes.  There are no new results to review at this time.  Assessment and Plan:  * Upper GI bleed Patient is presenting with several day history of melena with prior history of gastric ulcer and Barrett's esophagus.  Likely exacerbated by recent initiation of Eliquis.  - GI consulted; appreciate their recommendations - Clear liquid diet with n.p.o. after midnight - S/p Protonix 80 mg IV once.  Continue Protonix 40 mg IV twice daily - Hold home Eliquis  Symptomatic anemia Acute on chronic anemia with symptoms including worsening dyspnea on exertion, dizziness, generalized weakness in the setting of upper GI bleed.  - S/p 2 packed RBC units - Check posttransfusion CBC - Continue to transfuse for hemoglobin less than 7 - Discussed with oncology; lymphoma may be contributing to anemia.  Formal consultation placed  AKI (acute kidney injury) (HCC) Likely due to acute blood loss anemia with recent initiation of Lasix.  - Hold home nephrotoxic agents, including Lasix - S/p 500 cc bolus - Recheck BMP in the a.m.  Leg edema, right Patient states he has chronic bilateral lower extremity edema, however his right lower extremity has become suddenly larger than  his left.  DVT would be unusual given anticoagulation use, however he is high risk in the setting of active cancer.  - Right lower extremity Doppler  Paroxysmal atrial fibrillation  (HCC) Recently admitted for new onset paroxysmal atrial fibrillation.  Currently in sinus rhythm  - Continue amiodarone and metoprolol - Hold home Eliquis  Chronic heart failure with preserved ejection fraction (HCC) Patient appears hypervolemic on examination, however this may be due to hypoalbuminemia.  Given AKI, will hold Lasix.  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  Discussed with patient his healthcare goals, which he stated he would like to work on getting back on chemo as soon as possible.    - Oncology consulted; appreciate their recommendations  Coronary artery disease of native artery of native heart with stable angina pectoris [facility]) Patient states his Plavix was discontinued when he was started on Eliquis.  May ultimately require cardiology consultation if unable to resume anticoagulation.  Advance Care Planning:   Code Status: Full Code   Consults: GI, oncology  Family Communication: Patient's wife updated at bedside  Severity of Illness: The appropriate patient status for this patient is INPATIENT. Inpatient status is judged to be reasonable and necessary in order to provide the required intensity of service to ensure the patient's safety. The patient's presenting symptoms, physical exam findings, and initial radiographic and laboratory data in the context of their chronic comorbidities is felt to place them at high risk for further clinical deterioration. Furthermore, it is not anticipated that the patient will be medically stable for discharge from the hospital within 2 midnights of admission.   * I certify that at the point of admission it is my clinical judgment that the patient will require inpatient hospital care spanning beyond 2 midnights from the point of admission due  to high intensity of service, high risk for further deterioration and high frequency of surveillance required.*  Author: Verdene Lennert, MD 03/04/2024 9:43 PM  For on call review [email or web address].

## 2024-03-04 NOTE — Assessment & Plan Note (Signed)
 Patient appears hypervolemic on examination, however this may be due to hypoalbuminemia.  Given AKI, will hold Lasix.  - Hold home diuretics - Strict in and out - Daily weights

## 2024-03-04 NOTE — ED Notes (Signed)
Informed rn bed assigned 

## 2024-03-04 NOTE — Assessment & Plan Note (Signed)
 Likely due to acute blood loss anemia with recent initiation of Lasix.  - Hold home nephrotoxic agents, including Lasix - S/p 500 cc bolus - Recheck BMP in the a.m.

## 2024-03-04 NOTE — Assessment & Plan Note (Signed)
 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  Discussed with patient his healthcare goals, which he stated he would like to work on getting back on chemo as soon as possible.    - Oncology consulted; appreciate their recommendations

## 2024-03-04 NOTE — ED Triage Notes (Signed)
 Patient to ED via POV for rectal bleeding x1.5 weeks. States black and tarry stools. From cancer center- had 1 chemo tx so far. Hgb 6.5 at cancer center this AM. Stopped blood thinner yesterday. Also having nausea and weakness at this time. C/o left sided back pain. Pt pale in color.

## 2024-03-04 NOTE — Assessment & Plan Note (Signed)
 Patient states his Plavix was discontinued when he was started on Eliquis.  May ultimately require cardiology consultation if unable to resume anticoagulation.

## 2024-03-04 NOTE — Assessment & Plan Note (Signed)
 Patient is presenting with several day history of melena with prior history of gastric ulcer and Barrett's esophagus.  Likely exacerbated by recent initiation of Eliquis.  - GI consulted; appreciate their recommendations - Clear liquid diet with n.p.o. after midnight - S/p Protonix 80 mg IV once.  Continue Protonix 40 mg IV twice daily - Hold home Eliquis

## 2024-03-04 NOTE — Assessment & Plan Note (Signed)
 Acute on chronic anemia with symptoms including worsening dyspnea on exertion, dizziness, generalized weakness in the setting of upper GI bleed.  - S/p 2 packed RBC units - Check posttransfusion CBC - Continue to transfuse for hemoglobin less than 7 - Discussed with oncology; lymphoma may be contributing to anemia.  Formal consultation placed

## 2024-03-04 NOTE — Assessment & Plan Note (Signed)
 Recently admitted for new onset paroxysmal atrial fibrillation.  Currently in sinus rhythm  - Continue amiodarone and metoprolol - Hold home Eliquis

## 2024-03-04 NOTE — ED Notes (Signed)
 First Nurse Note: Pt to ED via Cancer center for rectal bleeding. Per NP pt has had blood in his stool. 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 Pt recently diagnosed with A. Fib and started on Eliquis. Pts initial sats at the cancer center were 87%, recheck SpO2 96%. Pt is in NAD.

## 2024-03-04 NOTE — Assessment & Plan Note (Signed)
 Patient states he has chronic bilateral lower extremity edema, however his right lower extremity has become suddenly larger than his left.  DVT would be unusual given anticoagulation use, however he is high risk in the setting of active cancer.  - Right lower extremity Doppler

## 2024-03-04 NOTE — Progress Notes (Signed)
 Symptom Management Clinic [facility] [phone] [phone]  Patient Care Team: Marjie Skiff, NP as PCP - General (Nurse Practitioner) End, Cristal Deer, MD as PCP - Cardiology (Cardiology) Minor, Theadora Rama, RN (Inactive) as [facility] Care Management Donneta Romberg, Worthy Flank, MD as Consulting Physician (Oncology) Jaynie Collins, DO as Consulting Physician (Gastroenterology)   NAME OF PATIENT: Jesse Norton  [id number]  June 26, 1946   DATE OF VISIT: 03/04/24  REASON FOR CONSULT: Jesse Norton is a 78 y.o. 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   INTERVAL HISTORY: Patient started cycle 1 rituximab dexamethasone on 02/20/2024.  He was subsequently hospitalized 02/26/2024 to 02/29/2024 with A-fib RVR.  Was started on Eliquis.  Patient also treated for pneumonia.  Patient presents to [facility] today for evaluation of fatigue, shortness of breath, and having dark and tarry stools.  Patient endorses progressive weakness and poor oral intake since he was discharged from the hospital.  He has noted loose and dark-colored/black stools for the past 1 to 1.5 weeks.  He has also noticed occasional bright red blood in the toilet after he defecates.  He denies hematuria or other bleeding.  He says that he has had shortness of breath without chest pain over that 1 to 2 days.  Denies fever or chills.  Does have occasional cough.  Denies any neurologic complaints. Patient offers no further specific complaints today.   PAST MEDICAL HISTORY: Past Medical History:  Diagnosis Date   (HFpEF) heart failure with preserved ejection fraction (HCC) 01/21/2019   a.) LHC 01/21/2019: EF 55-60%, LVEDP 25-30 mmHg   Anxiety    Aortic atherosclerosis (HCC)    Ascending aorta dilatation (HCC) 07/16/2023   a.) TTE 07/16/2023: 38 mm   Barrett's esophagus    BPH (benign prostatic hyperplasia)    CAD (coronary  artery disease) 10/2018   a.) ETT 11/06/2018: 1mm horizontal inflat ST depression, freq PVC's -> intermediate risk; b.) cCTA 12/23/2018: Ca2+ 2408 (93rd %'ile); FFR 0.6 mLAD, 0.87 mLCx, 0.77 dLCx --> cath recommended; c.) LHC/PCI 01/01/2019:  40% pLAD, 85% mLAD, 70% D2, 70% pLCx (2.75 x 15 mm Resolute Onyx DES), RCA min irregs -- staged PCI; d.) staged PCI 01/21/2019: orbital athrectomy + 3.0 x 15 mm Resolute Onyx DES mLAD   Chronic heart failure with preserved ejection fraction (HCC)    Colon polyps    Duodenal adenoma    Gastritis    GERD (gastroesophageal reflux disease)    Gout    H. pylori infection    H/O urethral stricture    Hemorrhoids    Hiatal hernia    History of bilateral cataract extraction 2023   History of splenectomy 1957   Hypertension    Mucosa-associated lymphoid tissue (MALT) lymphoma    Pre-diabetes    Pulmonary emphysema (HCC)    Stomach ulcer     PAST SURGICAL HISTORY:  Past Surgical History:  Procedure Laterality Date   CATARACT EXTRACTION W/PHACO Left 03/07/2022   Procedure: CATARACT EXTRACTION PHACO AND INTRAOCULAR LENS PLACEMENT (IOC) LEFT VIVITY TORIC LENS 10.41 00:55.7;  Surgeon: Galen Manila, MD;  Location: [facility];  Service: Ophthalmology;  Laterality: Left;   CATARACT EXTRACTION W/PHACO Right 03/28/2022   Procedure: CATARACT EXTRACTION PHACO AND INTRAOCULAR LENS PLACEMENT (IOC) RIGHT VIVITY LENS 6.68 00:50.0;  Surgeon: Galen Manila, MD;  Location: [facility];  Service: Ophthalmology;  Laterality: Right;   COLONOSCOPY     COLONOSCOPY N/A 06/19/2022   Procedure:  COLONOSCOPY;  Surgeon: Jaynie Collins, DO;  Location: [facility] ENDOSCOPY;  Service: Gastroenterology;  Laterality: N/A;   COLONOSCOPY WITH PROPOFOL N/A 08/20/2018   Procedure: COLONOSCOPY WITH PROPOFOL;  Surgeon: Christena Deem, MD;  Location: [facility] ENDOSCOPY;  Service: Endoscopy;  Laterality: N/A;   COLONOSCOPY WITH PROPOFOL N/A 12/19/2018   Procedure:  COLONOSCOPY WITH PROPOFOL;  Surgeon: Christena Deem, MD;  Location: [facility] ENDOSCOPY;  Service: Endoscopy;  Laterality: N/A;   CORONARY ATHERECTOMY N/A 02/03/2019   Procedure: CORONARY ATHERECTOMY;  Surgeon: Yvonne Kendall, MD;  Location: [facility] INVASIVE CV LAB;  Service: Cardiovascular;  Laterality: N/A;   CORONARY STENT INTERVENTION N/A 01/21/2019   Procedure: CORONARY STENT INTERVENTION;  Surgeon: Yvonne Kendall, MD;  Location: [facility] INVASIVE CV LAB;  Service: Cardiovascular;  Laterality: N/A;   CORONARY STENT INTERVENTION N/A 02/03/2019   Procedure: CORONARY STENT INTERVENTION;  Surgeon: Yvonne Kendall, MD;  Location: [facility]ASIVE CV LAB;  Service: Cardiovascular;  Laterality: N/A;   CORONARY ULTRASOUND/IVUS N/A 02/03/2019   Procedure: Intravascular Ultrasound/IVUS;  Surgeon: Yvonne Kendall, MD;  Location: [facility];  Service: Cardiovascular;  Laterality: N/A;   CYSTOSCOPY WITH URETHRAL DILATATION N/A 08/16/2018   Procedure: CYSTOSCOPY WITH URETHRAL DILATATION;  Surgeon: Sondra Come, MD;  Location: [facility] ORS;  Service: Urology;  Laterality: N/A;   ESOPHAGOGASTRODUODENOSCOPY N/A 01/29/2017   Procedure: ESOPHAGOGASTRODUODENOSCOPY (EGD);  Surgeon: Christena Deem, MD;  Location: [facility] ENDOSCOPY;  Service: Endoscopy;  Laterality: N/A;   ESOPHAGOGASTRODUODENOSCOPY N/A 06/19/2022   Procedure: ESOPHAGOGASTRODUODENOSCOPY (EGD);  Surgeon: Jaynie Collins, DO;  Location: [facility] ENDOSCOPY;  Service: Gastroenterology;  Laterality: N/A;   ESOPHAGOGASTRODUODENOSCOPY (EGD) WITH PROPOFOL N/A 07/10/2016   Procedure: ESOPHAGOGASTRODUODENOSCOPY (EGD) WITH PROPOFOL;  Surgeon: Christena Deem, MD;  Location: [facility] ENDOSCOPY;  Service: Endoscopy;  Laterality: N/A;   ESOPHAGOGASTRODUODENOSCOPY (EGD) WITH PROPOFOL N/A 12/19/2018   Procedure: ESOPHAGOGASTRODUODENOSCOPY (EGD) WITH PROPOFOL;  Surgeon: Christena Deem, MD;  Location: [facility] ENDOSCOPY;  Service: Endoscopy;  Laterality: N/A;   left  elbow repair Left    LEFT HEART CATH AND CORONARY ANGIOGRAPHY Left 01/21/2019   Procedure: LEFT HEART CATH AND CORONARY ANGIOGRAPHY;  Surgeon: Yvonne Kendall, MD;  Location: [facility] INVASIVE CV LAB;  Service: Cardiovascular;  Laterality: Left;   LYMPH NODE DISSECTION Left 02/08/2024   Procedure: LYMPH NODE DISSECTION;  Surgeon: Carolan Shiver, MD;  Location: [facility] ORS;  Service: General;  Laterality: Left;   PORTACATH PLACEMENT N/A 02/13/2024   Procedure: INSERTION, TUNNELED CENTRAL VENOUS DEVICE, WITH PORT;  Surgeon: Carolan Shiver, MD;  Location: [facility] ORS;  Service: General;  Laterality: N/A;   SPLENECTOMY  1957   TONSILLECTOMY     as a child    HEMATOLOGY/ONCOLOGY HISTORY:  Oncology History Overview Note  # NOV 2019- MALT [II opinion at [facility] path]; s/p sigmoid colon polyp resection [Dr.Skulskie; incidental]; PET scan -NED  # Barrett's esophagus- 2018;#Colonic polyps surveillance/Dr. Marva Panda ; s/p  EGD & Colo- Jan 2020  FEB 20th, 2025-  Numerous new bulky lymph nodes, predominantly in the neck and chest although also present in the abdomen and pelvis, consistent with recurrent lymphoma. 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  Left Axillary excisional lymph node biopsy on 2/28- Path- Discussed with pathology Dr.Smir-pathologically highly suspicious of EBV associated diffuse large B-cell lymphoma.  However B-cell clonality/and also FISH panel pending.  ABC subtype. Once path is confirmed proceed with Pola-RCHP.  Hepatitis panel negative-EBV IgG positive however IgM negative.    # MARCH 12th, 2025- Rituxan-Prednisone.   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) (HCC)   02/13/2024 Cancer Staging   Staging form: Hodgkin and Non-Hodgkin Lymphoma, AJCC 8th Edition - Clinical: Stage III - Signed by Earna Coder, MD on 02/13/2024   02/20/2024 - 02/20/2024 Chemotherapy   Patient is on Treatment Plan : NON-HODGKINS LYMPHOMA R-CHOP q21d     DLBCL (diffuse large B cell lymphoma) (HCC)  02/19/2024 Initial Diagnosis   DLBCL (diffuse large B cell lymphoma) (HCC)   02/19/2024 Cancer Staging   Staging form: Hodgkin and Non-Hodgkin Lymphoma, AJCC 8th Edition - Clinical: Stage III (Diffuse large B-cell lymphoma) - Signed by Earna Coder, MD on 02/19/2024   03/12/2024 -  Chemotherapy   Patient is on Treatment Plan : NON-HODGKINS LYMPHOMA POLA-R-CHP D1 X 6 cycles / Rituximab D1 q21d X 2 cycles       ALLERGIES:  has no known allergies.  MEDICATIONS:  Current Outpatient Medications  Medication Sig Dispense Refill   acyclovir (ZOVIRAX) 400 MG tablet Take 1 tablet (400 mg total) by mouth 2 (two) times daily. 120 tablet 2   allopurinol (ZYLOPRIM) 300 MG tablet Take 1 tablet (300 mg total) by mouth 2 (two) times daily. 120 tablet 1   amiodarone (PACERONE) 200 MG tablet Take 2 tablets (400 mg total) by mouth 2 (two) times daily for 5 days, THEN 1 tablet (200 mg total) daily. 50 tablet 0   apixaban (ELIQUIS) 5 MG TABS tablet Take 1 tablet (5 mg total) by mouth 2 (two) times daily. 60 tablet 2   atorvastatin (LIPITOR) 40 MG tablet TAKE 1 TABLET(40 MG) BY MOUTH DAILY 90 tablet 4   azithromycin (ZITHROMAX) 250 MG tablet Take one tablet (500 mg) by mouth once daily for 2 days 6 each 0   Blood Glucose Monitoring Suppl (ONETOUCH VERIO) w/Device KIT To check blood sugar twice a day with goal fasting in morning <130 and goal 2 hours after meal <180.  Write all checks down for provider visits. 1 kit 2   clobetasol ointment (TEMOVATE) 0.05 % Apply topically as needed.      feeding supplement (ENSURE ENLIVE / ENSURE PLUS) LIQD Take 237 mLs by mouth 2 (two) times daily between meals.     FLUoxetine (PROZAC) 10 MG tablet Take 1 tablet (10 mg total) by mouth daily. 90 tablet 3   furosemide (LASIX) 20 MG tablet Take 1 tablet (20 mg total) by mouth daily. 30 tablet 2   glucose blood test strip To check blood sugar twice a day with goal fasting in morning <130 and goal 2 hours after meal <180.  Write all checks down for provider visits. 100 each 2   HYDROcodone-acetaminophen (NORCO/VICODIN) 5-325 MG tablet Take 1 tablet by mouth every 6 (six) hours as needed for up to 10 days. 40 tablet 0   Lancets (ONETOUCH ULTRASOFT) lancets 1 each by Other route 2 (two) times daily. 100 each 12   lidocaine (LIDODERM) 5 % Place 1 patch onto the skin daily. Remove & Discard patch within 12 hours or as directed by MD 30 patch 3   lidocaine-prilocaine (EMLA) cream Apply on the port. 30 -45 min  prior to port access. 30 g 3  metoprolol tartrate (LOPRESSOR) 50 MG tablet Take 1 tablet (50 mg total) by mouth 2 (two) times daily. 60 tablet 2   nitroGLYCERIN (NITROSTAT) 0.4 MG SL tablet Place 1 tablet (0.4 mg total) under the tongue every 5 (five) minutes as needed for chest pain. Maximum of 3 doses. 25 tablet 3   ondansetron (ZOFRAN) 8 MG tablet One pill every 8 hours as needed for nausea/vomitting. 40 tablet 1   oxyCODONE (OXY IR/ROXICODONE) 5 MG immediate release tablet Take 1 tablet (5 mg total) by mouth every 4 (four) hours as needed for severe pain (pain score 7-10). 30 tablet 0   prochlorperazine (COMPAZINE) 10 MG tablet Take 1 tablet (10 mg total) by mouth every 6 (six) hours as needed for nausea or vomiting. 40 tablet 1   No current facility-administered medications for this visit.    VITAL SIGNS: There were no vitals taken for this visit. There were no vitals filed for this visit.  Estimated body mass index is 33.66 kg/m as calculated from the following:   Height as of 02/26/24: 5'  10" (1.778 m).   Weight as of 02/26/24: 234 lb 9.1 oz (106.4 kg).  LABS: CBC:    Component Value Date/Time   WBC 8.3 03/04/2024 1230   WBC 7.2 02/29/2024 0512   HGB 6.5 (LL) 03/04/2024 1230   HGB 14.3 01/26/2023 0946   HCT 19.8 (L) 03/04/2024 1230   HCT 38.9 01/26/2023 0946   PLT 386 03/04/2024 1230   PLT 322 01/26/2023 0946   MCV 110.6 (H) 03/04/2024 1230   MCV 107 (H) 01/26/2023 0946   MCV 95 01/06/2014 1007   NEUTROABS PENDING 03/04/2024 1230   NEUTROABS 2.5 01/26/2023 0946   LYMPHSABS PENDING 03/04/2024 1230   LYMPHSABS 2.4 01/26/2023 0946   MONOABS PENDING 03/04/2024 1230   EOSABS PENDING 03/04/2024 1230   EOSABS 0.5 (H) 01/26/2023 0946   BASOSABS PENDING 03/04/2024 1230   BASOSABS 0.2 01/26/2023 0946   Comprehensive Metabolic Panel:    Component Value Date/Time   NA 130 (L) 02/29/2024 0512   NA 141 08/28/2023 1131   NA 134 (L) 01/06/2014 1007   K 4.5 02/29/2024 0512   K 4.0 01/06/2014 1007   CL 98 02/29/2024 0512   CL 101 01/06/2014 1007   CO2 21 (L) 02/29/2024 0512   CO2 27 01/06/2014 1007   BUN 26 (H) 02/29/2024 0512   BUN 12 08/28/2023 1131   BUN 22 (H) 01/06/2014 1007   CREATININE 0.89 02/29/2024 0512   CREATININE 1.18 02/19/2024 0937   CREATININE 1.13 01/06/2014 1007   GLUCOSE 120 (H) 02/29/2024 0512   GLUCOSE 113 (H) 01/06/2014 1007   CALCIUM 8.2 (L) 02/29/2024 0512   CALCIUM 9.7 01/06/2014 1007   AST 49 (H) 02/19/2024 0937   ALT 42 02/19/2024 0937   ALKPHOS 48 02/19/2024 0937   BILITOT 1.2 02/19/2024 0937   PROT 6.0 (L) 02/19/2024 0937   PROT 7.2 08/28/2023 1131   ALBUMIN 2.9 (L) 02/19/2024 0937   ALBUMIN 4.4 08/28/2023 1131    RADIOGRAPHIC STUDIES: US Venous Img Upper Uni Left (DVT) Result Date: 02/27/2024 CLINICAL DATA:  Swelling.  History of lymphoma EXAM: Left UPPER EXTREMITY VENOUS DOPPLER ULTRASOUND TECHNIQUE: Gray-scale sonography with graded compression, as well as color Doppler and duplex ultrasound were performed to evaluate the  upper extremity deep venous system from the level of the subclavian vein and including the jugular, axillary, basilic, radial, ulnar and upper cephalic vein. Spectral Doppler was utilized to evaluate flow at  rest and with distal augmentation maneuvers. COMPARISON:  None Available. FINDINGS: Contralateral Subclavian Vein: Respiratory phasicity is normal and symmetric with the symptomatic side. No evidence of thrombus. Normal compressibility. Internal Jugular Vein: No evidence of thrombus. Normal compressibility, respiratory phasicity and response to augmentation. Subclavian Vein: No evidence of thrombus. Normal compressibility, respiratory phasicity and response to augmentation. Axillary Vein: No evidence of thrombus. Normal compressibility, respiratory phasicity and response to augmentation. Cephalic Vein: No evidence of thrombus. Normal compressibility, respiratory phasicity and response to augmentation. Basilic Vein: No evidence of thrombus. Normal compressibility, respiratory phasicity and response to augmentation. Brachial Veins: No evidence of thrombus. Normal compressibility, respiratory phasicity and response to augmentation. Radial Veins: No evidence of thrombus. Normal compressibility, respiratory phasicity and response to augmentation. Ulnar Veins: No evidence of thrombus. Normal compressibility, respiratory phasicity and response to augmentation. Venous Reflux:  None visualized. Other Findings: Note is made of numerous abnormal nodes identified in the visualized neck. Please correlate with prior workup. Scattered soft tissue edema as well along the forearm. IMPRESSION: No evidence of DVT within the left upper extremity. Electronically Signed   By: Karen Kays M.D.   On: 02/27/2024 14:52   DG Lumbar Spine 2-3 Views Result Date: 02/26/2024 CLINICAL DATA:  Back pain EXAM: LUMBAR SPINE - 2-3 VIEW COMPARISON:  CT 01/31/2024 FINDINGS: Degenerative disc disease most pronounced at L5-S1 with disc space  narrowing, spurring, and vacuum disc. Diffuse degenerative facet disease, most pronounced in the lower lumbar spine. Normal alignment. No fracture. IMPRESSION: Degenerative disc and facet disease.  No acute bony abnormality. Electronically Signed   By: Charlett Nose M.D.   On: 02/26/2024 23:27   CT Angio Chest PE W/Cm &/Or Wo Cm Result Date: 02/26/2024 CLINICAL DATA:  PE suspected. High probability. * Tracking Code: BO * EXAM: CT ANGIOGRAPHY CHEST WITH CONTRAST TECHNIQUE: Multidetector CT imaging of the chest was performed using the standard protocol during bolus administration of intravenous contrast. Multiplanar CT image reconstructions and MIPs were obtained to evaluate the vascular anatomy. RADIATION DOSE REDUCTION: This exam was performed according to the departmental dose-optimization program which includes automated exposure control, adjustment of the mA and/or kV according to patient size and/or use of iterative reconstruction technique. CONTRAST:  75mL OMNIPAQUE IOHEXOL 350 MG/ML SOLN COMPARISON:  PET-CT 02/05/2024 FINDINGS: Cardiovascular: No filling defects within the pulmonary arteries to suggest acute pulmonary embolism. Mediastinum/Nodes: Bulky mediastinum and LEFT axillary lymphadenopathy. RIGHT lower paratracheal node measures 3.4 cm short axis compared to 3.1 cm on PET-CT. LEFT supraclavicular node measures 3.2 cm compared to 2.3 cm. LEFT axillary nodal mass with bulky nodes. Individual node measures up to 3.4 cm compared to 2.3 cm. Node beneath the pectoralis muscle measures 2.7 cm compared to 2.0 cm. Visually nodes appear larger. Lungs/Pleura: Interstitial edema pattern. no  Infarction.  No pneumonia. 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 Multiple small spleen spanning diaphragm. Adrenal glands normal. Musculoskeletal: No aggressive osseous lesion. Review of the MIP images confirms the above findings. IMPRESSION: 1. No  acute pulmonary embolism. 2. No acute pulmonary parenchymal findings. 3. mild interstitial edema pattern. 4. Bulky mediastinal, supraclavicular and LEFT axillary adenopathy. The bulky LEFT axillary adenopathy is increased in size. Electronically Signed   By: Genevive Bi M.D.   On: 02/26/2024 17:07   DG Chest Port 1 View Result Date: 02/26/2024 CLINICAL DATA:  AFib with RVR EXAM: PORTABLE CHEST 1 VIEW COMPARISON:  Chest radiograph dated 02/13/2024 FINDINGS: Lines/tubes: Right chest  wall port tip projects over the SVC. Lungs: Unchanged asymmetric elevation of the right hemidiaphragm. Low lung volumes with bronchovascular crowding. Hazy and patchy left lower lung opacity. Pleura: No pneumothorax or pleural effusion. Heart/mediastinum: Similar mildly enlarged cardiomediastinal silhouette. Bones: No acute osseous abnormality. IMPRESSION: Low lung volumes with bronchovascular crowding. Hazy and patchy left lower lung opacity, likely atelectasis. Aspiration or pneumonia can be considered in the appropriate clinical setting. Electronically Signed   By: Agustin Cree M.D.   On: 02/26/2024 16:30   DG Chest Port 1 View Result Date: 02/13/2024 CLINICAL DATA:  Recurrent lymphoma, port catheter placement EXAM: PORTABLE CHEST 1 VIEW COMPARISON:  07/03/2018 FINDINGS: Right IJ power port catheter tip mid SVC level. No effusion or pneumothorax. Low lung volumes. Bibasilar atelectasis. Negative for edema. Mediastinal and hilar adenopathy better appreciated by comparison PET-CT. Aorta atherosclerotic. Trachea midline. IMPRESSION: 1. Right IJ port catheter tip mid SVC level. No pneumothorax. 2. Low lung volumes with bibasilar atelectasis. Electronically Signed   By: Judie Petit.  Shick M.D.   On: 02/13/2024 13:40   DG C-Arm 1-60 Min-No Report Result Date: 02/13/2024 Fluoroscopy was utilized by the requesting physician.  No radiographic interpretation.   ECHOCARDIOGRAM COMPLETE Result Date: 02/12/2024    ECHOCARDIOGRAM REPORT   Patient  Name:   HAJIME ASFAW Date of Exam: 02/12/2024 Medical Rec #:  [id number]        Height:       70.5 in Accession #:    [id number]       Weight:       235.0 lb Date of Birth:  03-11-1946        BSA:          2.247 m Patient Age:    78 years         BP:           116/72 mmHg Patient Gender: M                HR:           86 bpm. Exam Location:  [place] Procedure: 2D Echo, Color Doppler, Cardiac Doppler and Intracardiac            Opacification Agent (Both Spectral and Color Flow Doppler were            utilized during procedure). Indications:    R06.9 DOE; Z51.11 Encounter for antineoplastic chemotheraphy  History:        Patient has prior history of Echocardiogram examinations, most                 recent 07/16/2023. CHF, CAD, Signs/Symptoms:Dyspnea and Shortness                 of Breath; Risk Factors:Hypertension, Dyslipidemia and                 Non-Smoker.  Sonographer:    Ilda Mori MHA, BS, RDCS Referring Phys: [phone] CHRISTOPHER END  Sonographer Comments: Technically challenging study due to limited acoustic windows and patient is obese. Image acquisition challenging due to patient body habitus. IMPRESSIONS  1. Left ventricular ejection fraction, by estimation, is 60 to 65%. The left ventricle has normal function. The left ventricle has no regional wall motion abnormalities. Left ventricular diastolic parameters are consistent with Grade I diastolic dysfunction (impaired relaxation).  2. Right ventricular systolic function is normal. The right ventricular size is normal. There is normal pulmonary artery systolic pressure. The estimated right ventricular systolic pressure is 21.8 mmHg.  3.  Left atrial size was mildly dilated.  4. The mitral valve is normal in structure. No evidence of mitral valve regurgitation. No evidence of mitral stenosis.  5. The aortic valve is normal in structure. There is mild calcification of the aortic valve. Aortic valve regurgitation is not visualized. Aortic valve sclerosis is  present, with no evidence of aortic valve stenosis.  6. The inferior vena cava is normal in size with greater than 50% respiratory variability, suggesting right atrial pressure of 3 mmHg. FINDINGS  Left Ventricle: Left ventricular ejection fraction, by estimation, is 60 to 65%. The left ventricle has normal function. The left ventricle has no regional wall motion abnormalities. Strain was performed and the global longitudinal strain is indeterminate. The left ventricular internal cavity size was normal in size. There is no left ventricular hypertrophy. Left ventricular diastolic parameters are consistent with Grade I diastolic dysfunction (impaired relaxation). Right Ventricle: The right ventricular size is normal. No increase in right ventricular wall thickness. Right ventricular systolic function is normal. There is normal pulmonary artery systolic pressure. The tricuspid regurgitant velocity is 2.05 m/s, and  with an assumed right atrial pressure of 5 mmHg, the estimated right ventricular systolic pressure is 21.8 mmHg. Left Atrium: Left atrial size was mildly dilated. Right Atrium: Right atrial size was normal in size. Pericardium: There is no evidence of pericardial effusion. Mitral Valve: The mitral valve is normal in structure. No evidence of mitral valve regurgitation. No evidence of mitral valve stenosis. Tricuspid Valve: The tricuspid valve is normal in structure. Tricuspid valve regurgitation is mild . No evidence of tricuspid stenosis. Aortic Valve: The aortic valve is normal in structure. There is mild calcification of the aortic valve. Aortic valve regurgitation is not visualized. Aortic valve sclerosis is present, with no evidence of aortic valve stenosis. Aortic valve mean gradient  measures 4.0 mmHg. Aortic valve peak gradient measures 7.0 mmHg. Pulmonic Valve: The pulmonic valve was normal in structure. Pulmonic valve regurgitation is not visualized. No evidence of pulmonic stenosis. Aorta: The  aortic root is normal in size and structure. Venous: The inferior vena cava is normal in size with greater than 50% respiratory variability, suggesting right atrial pressure of 3 mmHg. IAS/Shunts: No atrial level shunt detected by color flow Doppler. Additional Comments: 3D was performed not requiring image post processing on an independent workstation and was indeterminate.  LEFT VENTRICLE PLAX 2D LVIDd:         4.60 cm Diastology LVIDs:         3.80 cm LV e' medial:    8.92 cm/s LV PW:         0.80 cm LV E/e' medial:  6.1 LV IVS:        0.80 cm LV e' lateral:   8.49 cm/s                        LV E/e' lateral: 6.4  RIGHT VENTRICLE RV S prime:     17.00 cm/s TAPSE (M-mode): 3.3 cm LEFT ATRIUM             Index LA diam:        4.90 cm 2.18 cm/m LA Vol (A2C):   56.8 ml 25.28 ml/m LA Vol (A4C):   34.6 ml 15.40 ml/m LA Biplane Vol: 44.5 ml 19.80 ml/m  AORTIC VALVE AV Vmax:           132.00 cm/s AV Vmean:          87.100  cm/s AV VTI:            0.211 m AV Peak Grad:      7.0 mmHg AV Mean Grad:      4.0 mmHg LVOT Vmax:         121.00 cm/s LVOT Vmean:        78.100 cm/s LVOT VTI:          0.205 m LVOT/AV VTI ratio: 0.97  AORTA Ao Root diam: 3.50 cm Ao Asc diam:  3.80 cm MITRAL VALVE               TRICUSPID VALVE MV Area (PHT): 3.42 cm    TR Peak grad:   16.8 mmHg MV Decel Time: 222 msec    TR Vmax:        205.00 cm/s MV E velocity: 54.50 cm/s MV A velocity: 93.80 cm/s  SHUNTS MV E/A ratio:  0.58        Systemic VTI: 0.20 m Julien Nordmann MD Electronically signed by Julien Nordmann MD Signature Date/Time: 02/12/2024/7:32:02 PM    Final    NM PET Image Restage (PS) Skull Base to Thigh (F-18 FDG) Result Date: 02/09/2024 CLINICAL DATA:  Subsequent treatment strategy for recurrent lymphoma. EXAM: NUCLEAR MEDICINE PET SKULL BASE TO THIGH TECHNIQUE: 12.51 mCi F-18 FDG was injected intravenously. Full-ring PET imaging was performed from the skull base to thigh after the radiotracer. CT data was obtained and used for  attenuation correction and anatomic localization. Fasting blood glucose: 109 mg/dl COMPARISON:  Prior PET-CT 11/11/2018 and recent CT scan 01/31/2024 FINDINGS: Mediastinal blood pool activity: SUV max 3.27 Liver activity: SUV max 4.51 NECK: Extensive multi station hypermetabolic cervical lymphadenopathy. Many of these nodes demonstrate low attenuation suggesting necrosis. Index right level 2 lymph node on image 24/6 measures 27 mm and SUV max is 16.28. Left-sided level 2 lymph node on image 18/6 measures 20 mm and SUV max is 12.8. Incidental CT findings: Bilateral carotid artery calcifications. CHEST: Extensive supraclavicular and left axillary lymphadenopathy. Many of the left axillary lymph nodes are partially necrotic. Index left axillary node on image 54/6 measures 26 mm and SUV max is 15.73. Extensive hypermetabolic mediastinal and hilar lymphadenopathy. 3 cm precarinal lymph node on image 49/6 has an SUV max of 16.95. No worrisome hypermetabolic pulmonary lesions. Incidental CT findings: Stable advanced aortic and three-vessel coronary artery calcifications. ABDOMEN/PELVIS: Extensive mesenteric and retroperitoneal hypermetabolic lymphadenopathy. Index 3.6 cm largely necrotic node on image 93/6 has an SUV max of 10.0. 21 mm right retroperitoneal lymph node on image 92/6 has an SUV max of 21.37. 11 mm right obturator node on image 136/6 has an SUV max of 14.47. 11 mm left inguinal node on image 151/6 has an SUV max of 11.79. Incidental CT findings: Stable advanced vascular disease but no aortic aneurysm. 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 Stable simple bilateral renal cysts not requiring any further imaging evaluation or follow-up. Stable right renal calculus. SKELETON: No findings suspicious for osseous lymphoma. Incidental CT findings: None. IMPRESSION: 1. Extensive partially necrotic hypermetabolic lymphadenopathy involving the neck, chest, abdomen and pelvis as detailed  above. Very similar findings when compared to the prior PET-CT from 2019. [place] 5. 2. No findings suspicious for osseous lymphoma. 3. 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 4. Stable advanced vascular disease. 5. Aortic atherosclerosis. Electronically Signed   By: Rudie Meyer M.D.   On: 02/09/2024 12:14    PERFORMANCE STATUS (ECOG) : 2 - Symptomatic, <  50% confined to bed  Review of Systems Unless otherwise noted, a complete review of systems is negative.  Physical Exam General: Pale, frail appearing Cardiovascular: regular rate and rhythm Pulmonary: clear anterior/posterior fields Abdomen: soft, nontender, + bowel sounds GU: no suprapubic tenderness Extremities: BLE/LUE edema, no joint deformities Skin: no rashes Neurological: Weakness but otherwise nonfocal  IMPRESSION/PLAN: DLBCL -status post cycle 1 Rituxan dexamethasone  Symptomatic anemia -hemoglobin 6.5 today, down from 10 1 week ago.  Patient held Eliquis this morning given apparent blood in stools.  Patient presently symptomatic with fatigue, shortness of breath, and mild hypoxia.  Discussed with Dr. Donneta Romberg and patient agreeable to transfer to the emergency department for further evaluation and management.  Report called to ED triage.   Patient expressed understanding and was in agreement with this plan. He also understands that He can call clinic at any time with any questions, concerns, or complaints.   Thank you for allowing me to participate in the care of this very pleasant patient.   Time Total: 20 minutes  Visit consisted of counseling and education dealing with the complex and emotionally intense issues of symptom management in the setting of serious illness.Greater than 50%  of this time was spent counseling and coordinating care related to the above assessment and plan.  Signed by: Laurette Schimke, PhD, NP-C

## 2024-03-04 NOTE — Progress Notes (Signed)
 Dizziness and weakness is worse today compared to yesterday.

## 2024-03-04 NOTE — ED Provider Notes (Signed)
 [facility] Provider Note    Event Date/Time   First MD Initiated Contact with Patient 03/04/24 1506     (approximate)   History   Rectal Bleeding   HPI  Jesse Norton is a 78 y.o. 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  Patient states that he is have black stool over the past week and a half that has been worsening.  He is concerned that he has a GI bleed.  Discontinued his Eliquis yesterday.  Complaining of generalized fatigue, weakness and shortness of breath.  Had lab work done today and was found to have a hemoglobin of 6.5 and was sent to the emergency department.  Does endorse some mild nausea but no episodes of vomiting.  No hematemesis.  No history of cirrhosis.  No NSAID use.  Denies any abdominal pain.  History of endoscopy in the past and states that he had abnormal tumor.  No alcohol use.     Physical Exam   Triage Vital Signs: ED Triage Vitals  Encounter Vitals Group     BP 03/04/24 1405 (!) 105/58     Systolic BP Percentile --      Diastolic BP Percentile --      Pulse Rate 03/04/24 1405 70     Resp 03/04/24 1405 18     Temp 03/04/24 1409 98.3 F (36.8 C)     Temp Source 03/04/24 1409 Oral     SpO2 03/04/24 1405 92 %     Weight 03/04/24 1407 235 lb (106.6 kg)     Height 03/04/24 1407 5\' 10"  (1.778 m)     Head Circumference --      Peak Flow --      Pain Score 03/04/24 1407 8     Pain Loc --      Pain Education --      Exclude from Growth Chart --     Most recent vital signs: Vitals:   03/04/24 2143 03/04/24 2232  BP: 109/71 106/62  Pulse: 72 78  Resp: 14 16  Temp: 97.8 F (36.6 C) 97.6 F (36.4 C)  SpO2: 99% 98%    Physical Exam Exam conducted with a chaperone present.  Constitutional:      Appearance: He is well-developed. He is ill-appearing.  HENT:      Head: Atraumatic.  Eyes:     Comments: Pale conjunctiva  Cardiovascular:     Rate and Rhythm: Regular rhythm.  Pulmonary:     Effort: No respiratory distress.     Comments: Tachypneic with minimal effort Abdominal:     Tenderness: There is no abdominal tenderness. There is no right CVA tenderness or left CVA tenderness.  Genitourinary:    Comments: Melanotic stool Musculoskeletal:     Cervical back: Normal range of motion.     Right lower leg: Edema present.     Left lower leg: Edema present.     Comments: Diffuse body wall edema  Skin:    General: Skin is warm.  Neurological:     Mental Status: He is alert. Mental status is at baseline.  Psychiatric:        Mood and Affect: Mood normal.     IMPRESSION / MDM / ASSESSMENT AND PLAN / ED COURSE  I reviewed the triage vital signs and the nursing notes.  Differential diagnosis including upper  GI bleed, lower GI bleed, anemia secondary to chemotherapy.  Patient was typed and crossed.  Hemoglobin 6.5, repeat hemoglobin in the emergency department remained stable at 6.5.  Appears microcytic.  Has obvious melena on exam.   No tachycardic or bradycardic dysrhythmias while on cardiac telemetry.  LABS (all labs ordered are listed, but only abnormal results are displayed) Labs interpreted as -    Labs Reviewed  CBC WITH DIFFERENTIAL/PLATELET - Abnormal; Notable for the following components:      Result Value   RBC 1.80 (*)    Hemoglobin 6.5 (*)    HCT 19.8 (*)    MCV 110.0 (*)    MCH 36.1 (*)    RDW 16.7 (*)    nRBC 19.7 (*)    Lymphs Abs 0.6 (*)    Abs Immature Granulocytes 0.17 (*)    All other components within normal limits  PROTIME-INR - Abnormal; Notable for the following components:   Prothrombin Time 18.9 (*)    INR 1.6 (*)    All other components within normal limits  RETICULOCYTES - Abnormal; Notable for the following components:   Retic Ct Pct 7.9 (*)    RBC. 1.79 (*)    Immature Retic Fract 50.8 (*)    All  other components within normal limits  APTT  BASIC METABOLIC PANEL  HAPTOGLOBIN  URIC ACID  GLUCOSE 6 PHOSPHATE DEHYDROGENASE  TYPE AND SCREEN  PREPARE RBC (CROSSMATCH)  ABO/RH     MDM  Patient was typed and crossed for 2 units PRBC.  Given 1 unit PRBC.  Concern for upper GI bleed.  Given IV Protonix.  Hemoglobin stable.  Patient is already holding Eliquis, no hypotension.  Will hold on giving Kcentra at this time.  Consulted hospitalist for admission.     PROCEDURES:  Critical Care performed: yes  .Critical Care  Performed by: Corena Herter, MD Authorized by: Corena Herter, MD   Critical care provider statement:    Critical care time (minutes):  30   Critical care time was exclusive of:  Separately billable procedures and treating other patients   Critical care was necessary to treat or prevent imminent or life-threatening deterioration of the following conditions:  Circulatory failure   Critical care was time spent personally by me on the following activities:  Development of treatment plan with patient or surrogate, discussions with consultants, evaluation of patient's response to treatment, examination of patient, ordering and review of laboratory studies, ordering and review of radiographic studies, ordering and performing treatments and interventions, pulse oximetry, re-evaluation of patient's condition and review of old charts   Care discussed with: admitting provider     Patient's presentation is most consistent with acute presentation with potential threat to life or bodily function.   MEDICATIONS ORDERED IN ED: Medications  0.9 %  sodium chloride infusion (has no administration in time range)  sodium chloride flush (NS) 0.9 % injection 3 mL (3 mLs Intravenous Given 03/04/24 2228)  acetaminophen (TYLENOL) tablet 650 mg (650 mg Oral Given 03/04/24 2018)    Or  acetaminophen (TYLENOL) suppository 650 mg ( Rectal See Alternative 03/04/24 2018)  ondansetron (ZOFRAN)  tablet 4 mg (has no administration in time range)    Or  ondansetron (ZOFRAN) injection 4 mg (has no administration in time range)  pantoprazole (PROTONIX) injection 40 mg (40 mg Intravenous Given 03/04/24 2227)  HYDROcodone-acetaminophen (NORCO/VICODIN) 5-325 MG per tablet 1-2 tablet (1 tablet Oral Given 03/04/24 2232)  amiodarone (PACERONE) tablet 200 mg (has no administration  in time range)  acyclovir (ZOVIRAX) 200 MG capsule 400 mg (has no administration in time range)  allopurinol (ZYLOPRIM) tablet 300 mg (has no administration in time range)  atorvastatin (LIPITOR) tablet 40 mg (has no administration in time range)  metoprolol tartrate (LOPRESSOR) tablet 50 mg (has no administration in time range)  ondansetron (ZOFRAN) injection 4 mg (4 mg Intravenous Given 03/04/24 1538)  pantoprazole (PROTONIX) injection 80 mg (80 mg Intravenous Given 03/04/24 1541)  lactated ringers bolus 500 mL (0 mLs Intravenous Stopped 03/04/24 1616)    FINAL CLINICAL IMPRESSION(S) / ED DIAGNOSES   Final diagnoses:  Acute GI bleeding     Rx / DC Orders   ED Discharge Orders     None        Note:  This document was prepared using Dragon voice recognition software and may include unintentional dictation errors.   Corena Herter, MD 03/05/24 0030

## 2024-03-05 ENCOUNTER — Encounter
Admission: EM | Disposition: E | Payer: Self-pay | Source: Ambulatory Visit | Attending: Student in an Organized Health Care Education/Training Program

## 2024-03-05 ENCOUNTER — Encounter: Payer: Self-pay | Admitting: Internal Medicine

## 2024-03-05 ENCOUNTER — Telehealth: Payer: Self-pay | Admitting: Internal Medicine

## 2024-03-05 ENCOUNTER — Other Ambulatory Visit: Payer: Self-pay | Admitting: Internal Medicine

## 2024-03-05 ENCOUNTER — Other Ambulatory Visit: Payer: Self-pay

## 2024-03-05 ENCOUNTER — Inpatient Hospital Stay

## 2024-03-05 ENCOUNTER — Inpatient Hospital Stay: Admitting: Certified Registered"

## 2024-03-05 DIAGNOSIS — K921 Melena: Secondary | ICD-10-CM

## 2024-03-05 DIAGNOSIS — K269 Duodenal ulcer, unspecified as acute or chronic, without hemorrhage or perforation: Secondary | ICD-10-CM | POA: Diagnosis not present

## 2024-03-05 DIAGNOSIS — C8333 Diffuse large B-cell lymphoma, intra-abdominal lymph nodes: Secondary | ICD-10-CM

## 2024-03-05 DIAGNOSIS — B379 Candidiasis, unspecified: Secondary | ICD-10-CM | POA: Diagnosis not present

## 2024-03-05 DIAGNOSIS — K319 Disease of stomach and duodenum, unspecified: Secondary | ICD-10-CM

## 2024-03-05 DIAGNOSIS — D649 Anemia, unspecified: Secondary | ICD-10-CM | POA: Diagnosis not present

## 2024-03-05 DIAGNOSIS — K922 Gastrointestinal hemorrhage, unspecified: Secondary | ICD-10-CM | POA: Diagnosis not present

## 2024-03-05 HISTORY — PX: ESOPHAGOGASTRODUODENOSCOPY: SHX5428

## 2024-03-05 LAB — BASIC METABOLIC PANEL
Anion gap: 10 (ref 5–15)
BUN: 33 mg/dL — ABNORMAL HIGH (ref 8–23)
CO2: 24 mmol/L (ref 22–32)
Calcium: 9 mg/dL (ref 8.9–10.3)
Chloride: 97 mmol/L — ABNORMAL LOW (ref 98–111)
Creatinine, Ser: 1.27 mg/dL — ABNORMAL HIGH (ref 0.61–1.24)
GFR, Estimated: 57 mL/min — ABNORMAL LOW (ref >60)
Glucose, Bld: 99 mg/dL (ref 70–99)
Potassium: 4.2 mmol/L (ref 3.5–5.1)
Sodium: 131 mmol/L — ABNORMAL LOW (ref 135–145)

## 2024-03-05 LAB — CBC
HCT: 24.4 % — ABNORMAL LOW (ref 39.0–52.0)
Hemoglobin: 8.6 g/dL — ABNORMAL LOW (ref 13.0–17.0)
MCH: 34.1 pg — ABNORMAL HIGH (ref 26.0–34.0)
MCHC: 35.2 g/dL (ref 30.0–36.0)
MCV: 96.8 fL (ref 80.0–100.0)
Platelets: 365 10*3/uL (ref 150–400)
RBC: 2.52 MIL/uL — ABNORMAL LOW (ref 4.22–5.81)
RDW: 21.5 % — ABNORMAL HIGH (ref 11.5–15.5)
WBC: 7.3 10*3/uL (ref 4.0–10.5)
nRBC: 24.2 % — ABNORMAL HIGH (ref 0.0–0.2)

## 2024-03-05 LAB — TYPE AND SCREEN
ABO/RH(D): A POS
Antibody Screen: NEGATIVE
Unit division: 0
Unit division: 0

## 2024-03-05 LAB — KOH PREP

## 2024-03-05 LAB — BPAM RBC
Blood Product Expiration Date: 202504052359
Blood Product Expiration Date: 202504182359
ISSUE DATE / TIME: 202503251612
ISSUE DATE / TIME: 202503252035
Unit Type and Rh: 600
Unit Type and Rh: 6200

## 2024-03-05 LAB — URIC ACID: Uric Acid, Serum: 3.7 mg/dL (ref 3.7–8.6)

## 2024-03-05 SURGERY — EGD (ESOPHAGOGASTRODUODENOSCOPY)
Anesthesia: General

## 2024-03-05 MED ORDER — SODIUM CHLORIDE 0.9 % IV SOLN: INTRAVENOUS | Status: DC

## 2024-03-05 MED ORDER — CHLORHEXIDINE GLUCONATE CLOTH 2 % EX PADS: 6.0000 | MEDICATED_PAD | Freq: Every day | CUTANEOUS | Status: DC

## 2024-03-05 MED ORDER — PROPOFOL 500 MG/50ML IV EMUL
INTRAVENOUS | Status: DC | PRN
  Administered 2024-03-05: 145 ug/kg/min via INTRAVENOUS

## 2024-03-05 MED ORDER — GLYCOPYRROLATE 0.2 MG/ML IJ SOLN
INTRAMUSCULAR | Status: DC | PRN
  Administered 2024-03-05: .2 mg via INTRAVENOUS

## 2024-03-05 MED ORDER — PROPOFOL 10 MG/ML IV BOLUS
INTRAVENOUS | Status: DC | PRN
  Administered 2024-03-05: 60 mg via INTRAVENOUS

## 2024-03-05 MED ORDER — PANTOPRAZOLE SODIUM 40 MG PO TBEC
40.0000 mg | DELAYED_RELEASE_TABLET | Freq: Two times a day (BID) | ORAL | Status: DC
  Administered 2024-03-05 – 2024-03-13 (×17): 40 mg via ORAL
  Filled 2024-03-05 (×17): qty 1

## 2024-03-05 MED ORDER — LIDOCAINE HCL (CARDIAC) PF 100 MG/5ML IV SOSY
PREFILLED_SYRINGE | INTRAVENOUS | Status: DC | PRN
  Administered 2024-03-05: 100 mg via INTRAVENOUS

## 2024-03-05 NOTE — Progress Notes (Signed)
 PROGRESS NOTE Jesse Norton    DOB: 10/10/1946, 78 y.o.  JYN:[id number]    Code Status: Full Code   DOA: 03/04/2024   LOS: 1   Brief hospital course  Jesse Norton is a 78 y.o. 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 Of note, patient was recently admitted for atrial fibrillation with RVR, new diagnosis and started on amiodarone, metoprolol, and Eliquis.  During this admission, he was also treated for CAP with azithromycin and ceftriaxone.   ED course: 112/59 with heart rate of 71.  He was saturating at 93% on room air.  He was afebrile at 97.8.  Initial workup notable for hemoglobin of 6.5, MCV of 110, creatinine 1.44, sodium 129, AST 50, GFR 49.  INR 1.6.  2 units of packed RBCs ordered and patient started on Protonix.   03/05/24 -feeling improved. stable  Assessment & Plan  Principal Problem:   Upper GI bleed Active Problems:   Symptomatic anemia   AKI (acute kidney injury) (HCC)   Leg edema, right   Paroxysmal atrial fibrillation (HCC)   Chronic heart failure with preserved ejection fraction (HCC)   DLBCL (diffuse large B cell lymphoma) (HCC)   Coronary artery disease of native artery of native heart with stable angina pectoris (HCC)  Upper GI bleed- relating to recent eliquis initiation for afib Symptomatic anemia- symptoms improving. Partial bleed and lymphoma contributing. Hgb 6.5>>8.6 s/p transfusions prior history of gastric ulcer and Barrett's esophagus.   White plaque on lower esophagus, gastric antrum erosions, duodenal ulcers. None actively bleeding. No biopsies were collected. Found on EGD 3/26. - f/u testing for H. Pylori - continue hold eliquis - s/p 2 units pRBCs - heme/onc following -CBC am  Suspected candidal esophagitis- brushings  obtained in EGD - start empirical anti-fungal and follow pathology - GI consulted; appreciate their recommendations - Clear liquid diet with n.p.o. after midnight - S/p Protonix 80 mg IV once.  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   - Oncology consulted; appreciate their recommendations. Planning chemo Friday. Likely dc on Saturday if clinically and labs stable    AKI- improving. Normal baseline. Cr 1.44>1.27 - BMP am   Leg edema, right - f/u results Right lower extremity Doppler   Paroxysmal atrial fibrillation (HCC) Recently admitted for new onset paroxysmal atrial fibrillation.  Currently in sinus rhythm - Continue amiodarone and metoprolol - Hold home Eliquis   Chronic heart failure with preserved ejection fraction (HCC) Patient appears hypervolemic on examination - Hold home diuretics - Strict in and out - Daily weights   Coronary artery disease of native artery of native heart with stable angina pectoris [name]) Patient states his Plavix was discontinued when he was started on Eliquis.  May ultimately require cardiology consultation if unable to resume anticoagulation.  Body mass index is 33.72 kg/m.  VTE ppx: SCDs Start: 03/04/24 1739   Diet:     Diet   Diet NPO time specified   Consultants: GI Heme/onc  Subjective 03/05/24    Pt reports feeling much improved today. Still having generalized weakness, sob, lightheadedness, etc attributing to  anemia. Had multiple Bms without apparent bleeding   Objective   Vitals:   03/04/24 2052 03/04/24 2054 03/04/24 2143 03/04/24 2232  BP: (!) 113/56 (!) 113/56 109/71 106/62  Pulse:  74 72 78  Resp: 20 20 14 16   Temp: 97.7 F (36.5 C) 97.7 F (36.5 C) 97.8 F (36.6 C) 97.6 F (36.4 C)  TempSrc: Oral   Oral  SpO2: 99%  97% 99% 98%  Weight:      Height:        Intake/Output Summary (Last 24 hours) at 03/05/2024 3086 Last data filed at 03/05/2024 0200 Gross per 24 hour  Intake 1943.75 ml  Output 222 ml  Net 1721.75 ml   Filed Weights   03/04/24 1407  Weight: 106.6 kg    Physical Exam:  General: awake, alert, NAD HEENT: atraumatic, clear conjunctiva, anicteric sclera, MMM, hearing grossly normal Respiratory: normal respiratory effort. CTAB. Healing incision on R chest.  Cardiovascular: quick capillary refill, normal S1/S2, RRR, no JVD, murmurs Gastrointestinal: soft, NT, ND Nervous: A&O x3. no gross focal neurologic deficits, normal speech Extremities: moves all equally, no edema, normal tone Skin: dry, intact, normal temperature, normal color. No rashes, lesions or ulcers on exposed skin Psychiatry: normal mood, congruent affect  Labs   I have personally reviewed the following labs and imaging studies CBC    Component Value Date/Time   WBC 8.6 03/04/2024 1529   WBC 8.3 03/04/2024 1230   RBC 1.80 (L) 03/04/2024 1529   RBC 1.79 (L) 03/04/2024 1529   HGB 6.5 (L) 03/04/2024 1529   HGB 6.5 (LL) 03/04/2024 1230   HGB 14.3 01/26/2023 0946   HCT 19.8 (L) 03/04/2024 1529   HCT 38.9 01/26/2023 0946   PLT 381 03/04/2024 1529   PLT 386 03/04/2024 1230   PLT 322 01/26/2023 0946   MCV 110.0 (H) 03/04/2024 1529   MCV 107 (H) 01/26/2023 0946   MCV 95 01/06/2014 1007   MCH 36.1 (H) 03/04/2024 1529   MCHC 32.8 03/04/2024 1529   RDW 16.7 (H) 03/04/2024 1529   RDW 12.0 01/26/2023 0946   RDW 13.6 01/06/2014 1007   LYMPHSABS 0.6 (L) 03/04/2024 1529   LYMPHSABS 2.4 01/26/2023 0946   MONOABS 0.7 03/04/2024 1529   EOSABS 0.1 03/04/2024 1529   EOSABS 0.5 (H) 01/26/2023 0946   BASOSABS 0.1 03/04/2024 1529   BASOSABS 0.2 01/26/2023 0946      Latest Ref Rng & Units 03/05/2024    4:46 AM 03/04/2024   12:30 PM 02/29/2024    5:12 AM  BMP  Glucose 70 - 99 mg/dL 99  578  469   BUN 8 - 23 mg/dL 33  33  26    Creatinine 0.61 - 1.24 mg/dL 6.29  5.28  4.13   Sodium 135 - 145 mmol/L 131  129  130   Potassium 3.5 - 5.1 mmol/L 4.2  4.6  4.5   Chloride 98 - 111 mmol/L 97  93  98   CO2 22 - 32 mmol/L 24  22  21    Calcium 8.9 - 10.3 mg/dL 9.0  8.8  8.2     No results found.  Disposition Plan & Communication  Patient status: Inpatient  Admitted From: Home Planned disposition location: Home Anticipated discharge date: 3/29 pending chemo treatment  Family Communication: wife at bedside    Author: Leeroy Bock, DO [facility] 03/05/2024, 7:22 AM   Available by Epic secure chat 7AM-7PM. If 7PM-7AM, please contact night-coverage.  [facility] contact information  found on [email or web address].

## 2024-03-05 NOTE — Telephone Encounter (Signed)
 Called patient Jesse Norton, advised that Dr.End was out of office at this time. Advised that we would send a message to inbox to make aware.   Advised to call us back with any questions/concerns.

## 2024-03-05 NOTE — Telephone Encounter (Signed)
 Patient over the likely take chemotherapy inpatient on 3/28-Friday-May get out out of the hospital over the weekend.  Please schedule appointment with- APP on on 3/31- venofer- labs- cbc/cmp;LDH; phos, mag- D-2 possible 1 unit.  Cancel chemo appts for 4/2; 4-3; and 4-3  Schedule appt with MD-4/4-  venofer- labs- cbc/cmp- GB

## 2024-03-05 NOTE — Transfer of Care (Signed)
 Immediate Anesthesia Transfer of Care Note  Patient: Jesse Norton  Procedure(s) Performed: EGD (ESOPHAGOGASTRODUODENOSCOPY)  Patient Location: PACU and Endoscopy Unit  Anesthesia Type:General  Level of Consciousness: drowsy and patient cooperative  Airway & Oxygen Therapy: Patient Spontanous Breathing and Patient connected to face mask oxygen  Post-op Assessment: Report given to RN and Post -op Vital signs reviewed and stable  Post vital signs: Reviewed and stable  Last Vitals:  Vitals Value Taken Time  BP 87/59 03/05/24 0902  Temp 35.4 C 03/05/24 0901  Pulse 86 03/05/24 0903  Resp 26 03/05/24 0904  SpO2 100 % 03/05/24 0903  Vitals shown include unfiled device data.  Last Pain:  Vitals:   03/05/24 0901  TempSrc: Temporal  PainSc:          Complications: No notable events documented.

## 2024-03-05 NOTE — Evaluation (Signed)
 Physical Therapy Evaluation Patient Details Name: Jesse Norton MRN: [id number] DOB: 1946-03-18 Today's Date: 03/05/2024  History of Present Illness  Pt is a 78 y.o. male  who presents to the ED due to melena. Admitted for management of upper GI bleed, anemia, AKI. 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  Clinical Impression  Pt is a pleasant 78 year old male who was admitted for upper GI bleed. Pt performs bed mobility/transfers with supervision and ambulation with cga and RW. Pt demonstrates deficits with endurance/mobility/strength. Will plan for another session tomorrow to attempt stair training. Would benefit from skilled PT to address above deficits and promote optimal return to PLOF. Pt will continue to receive skilled PT services while admitted and will defer to TOC/care team for updates regarding disposition planning.       If plan is discharge home, recommend the following: A little help with walking and/or transfers;A little help with bathing/dressing/bathroom;Help with stairs or ramp for entrance;Assist for transportation   Can travel by private vehicle        Equipment Recommendations Rolling walker (2 wheels);BSC/3in1  Recommendations for Other Services       Functional Status Assessment Patient has had a recent decline in their functional status and demonstrates the ability to make significant improvements in function in a reasonable and predictable amount of time.     Precautions / Restrictions Precautions Precautions: Fall Recall of Precautions/Restrictions: Intact Restrictions Weight Bearing Restrictions Per Provider Order: No      Mobility  Bed Mobility Overal bed mobility: Needs Assistance Bed Mobility: Supine to Sit     Supine to sit: Supervision Sit to supine: Supervision   General bed mobility comments: takes  slightly increased time.    Transfers Overall transfer level: Needs assistance Equipment used: Rolling walker (2 wheels) Transfers: Sit to/from Stand, Bed to chair/wheelchair/BSC Sit to Stand: Supervision           General transfer comment: tries to pull up on RW. Cues for sequencing. Once standing, upright posture    Ambulation/Gait Ambulation/Gait assistance: Contact guard assist Gait Distance (Feet): 40 Feet Assistive device: Rolling walker (2 wheels) Gait Pattern/deviations: Step-through pattern       General Gait Details: ambulated in room, fatigues quickly but no rest break needed. HR at 97bpm post exertion  Stairs            Wheelchair Mobility     Tilt Bed    Modified Rankin (Stroke Patients Only)       Balance Overall balance assessment: Needs assistance Sitting-balance support: Bilateral upper extremity supported Sitting balance-Leahy Scale: Good     Standing balance support: Bilateral upper extremity supported Standing balance-Leahy Scale: Fair                               Pertinent Vitals/Pain Pain Assessment Pain Assessment: No/denies pain    Home Living Family/patient expects to be discharged to:: Private residence Living Arrangements: Spouse/significant other Available Help at Discharge: Family;Available 24 hours/day Type of Home: House Home Access: Stairs to enter Entrance Stairs-Rails: Lawyer of Steps: 6 Alternate Level Stairs-Number of Steps: 16 steps with bil HR Home Layout: Two level;Bed/bath upstairs Home Equipment: Rollator (4 wheels);Shower seat;Grab bars - tub/shower      Prior Function Prior Level of Function : Independent/Modified Independent;Driving  Mobility Comments: IND without AD use prior to 2 weeks ago ADLs Comments: IND and driving 2 weeks ago     Extremity/Trunk Assessment   Upper Extremity Assessment Upper Extremity Assessment: Overall WFL for  tasks assessed    Lower Extremity Assessment Lower Extremity Assessment: Generalized weakness (B LE grossly 4+/5)       Communication   Communication Communication: No apparent difficulties    Cognition Arousal: Alert Behavior During Therapy: WFL for tasks assessed/performed   PT - Cognitive impairments: No apparent impairments                       PT - Cognition Comments: pleasant and agreeable to session Following commands: Intact       Cueing Cueing Techniques: Verbal cues     General Comments General comments (skin integrity, edema, etc.): BP 100/63 seated dropped to 93/46 with standing for peri-care; pt reports mild lightheadedness with standing    Exercises     Assessment/Plan    PT Assessment Patient needs continued PT services  PT Problem List Decreased strength;Decreased balance;Decreased mobility;Decreased knowledge of use of DME;Cardiopulmonary status limiting activity       PT Treatment Interventions Gait training;DME instruction;Therapeutic exercise;Balance training    PT Goals (Current goals can be found in the Care Plan section)  Acute Rehab PT Goals Patient Stated Goal: to go home PT Goal Formulation: With patient Time For Goal Achievement: 03/19/24 Potential to Achieve Goals: Good    Frequency Min 2X/week     Co-evaluation               AM-PAC PT "6 Clicks" Mobility  Outcome Measure Help needed turning from your back to your side while in a flat bed without using bedrails?: None Help needed moving from lying on your back to sitting on the side of a flat bed without using bedrails?: A Little Help needed moving to and from a bed to a chair (including a wheelchair)?: A Little Help needed standing up from a chair using your arms (e.g., wheelchair or bedside chair)?: A Little Help needed to walk in hospital room?: A Little Help needed climbing 3-5 steps with a railing? : A Lot 6 Click Score: 18    End of Session   Activity  Tolerance: Patient tolerated treatment well Patient left: in bed;with bed alarm set Nurse Communication: Mobility status PT Visit Diagnosis: Muscle weakness (generalized) (M62.81);Difficulty in walking, not elsewhere classified (R26.2);Unsteadiness on feet (R26.81)    Time: 1610-9604 PT Time Calculation (min) (ACUTE ONLY): 24 min   Charges:   PT Evaluation $PT Eval Low Complexity: 1 Low PT Treatments $Gait Training: 8-22 mins PT General Charges $$ ACUTE PT VISIT: 1 Visit         Elizabeth Palau, PT, DPT, GCS [phone]   Leler Brion 03/05/2024, 4:52 PM

## 2024-03-05 NOTE — Consult Note (Signed)
 [facility] CONSULT NOTE  Patient Care Team: Marjie Skiff, NP as PCP - General (Nurse Practitioner) End, Cristal Deer, MD as PCP - Cardiology (Cardiology) Minor, Theadora Rama, RN (Inactive) as [facility] Care Management Donneta Romberg, Worthy Flank, MD as Consulting Physician (Oncology) Jaynie Collins, DO as Consulting Physician (Gastroenterology)  CHIEF COMPLAINTS/PURPOSE OF CONSULTATION: DLBCL/severe anemia  HISTORY OF PRESENTING ILLNESS:  Jesse Norton 78 y.o.  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  Patient was recently discharged in the hospital after diagnosed with A-fib with RVR and at discharge was started on Eliquis and amiodarone.  However, he continued to have black loose stool and also worsening fatigue and extreme paleness.  Patient did have worsening generalized weakness and also dizziness with worsening dyspnea on exertion.  On the day of admission patient was evaluated in the cancer center noted to have a hemoglobin around 6.5.  Patient is s/p cardiac units of PRBC transfusion  With regards to history of diffuse large cell lymphoma patient approximate 2 weeks ago received rituximab alone while pending final pathology.  Patient has clinically stable.  Complains of ongoing swelling in the legs right more than left.  And also complains of swelling of his right upper extremity.  Review of Systems  Constitutional:  Positive for malaise/fatigue. Negative for chills, diaphoresis, fever and weight loss.  HENT:  Negative for nosebleeds and sore throat.   Eyes:  Negative for double vision.  Respiratory:  Positive for shortness of breath. Negative for cough, hemoptysis, sputum production and wheezing.   Cardiovascular:  Positive for orthopnea and leg  swelling. Negative for chest pain and palpitations.  Gastrointestinal:  Positive for nausea. Negative for abdominal pain, blood in stool, constipation, diarrhea, heartburn, melena and vomiting.  Genitourinary:  Negative for dysuria, frequency and urgency.  Musculoskeletal:  Positive for back pain and joint pain.  Skin: Negative.  Negative for itching and rash.  Neurological:  Positive for dizziness and weakness. Negative for tingling, focal weakness and headaches.  Endo/Heme/Allergies:  Does not bruise/bleed easily.  Psychiatric/Behavioral:  Negative for depression. The patient is not nervous/anxious and does not have insomnia.     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   a.) TTE 07/16/2023: 38 mm   Barrett's esophagus    BPH (benign prostatic hyperplasia)    CAD (coronary artery disease) 10/2018   a.) ETT 11/06/2018: 1mm horizontal inflat ST depression, freq PVC's -> intermediate risk; b.) cCTA 12/23/2018: Ca2+ 2408 (93rd %'ile); FFR 0.6 mLAD, 0.87 mLCx, 0.77 dLCx --> cath recommended; c.) LHC/PCI 01/01/2019:  40% pLAD, 85% mLAD, 70% D2, 70% pLCx (2.75 x 15 mm Resolute Onyx DES), RCA min irregs -- staged PCI; d.) staged PCI 01/21/2019: orbital athrectomy + 3.0 x 15 mm Resolute Onyx DES mLAD   Chronic heart failure with preserved ejection fraction (HCC)    Colon polyps    Duodenal adenoma    Gastritis    GERD (gastroesophageal reflux disease)    Gout    H. pylori infection    H/O urethral stricture    Hemorrhoids  Hiatal hernia    History of bilateral cataract extraction 2023   History of splenectomy 1957   Hypertension    Mucosa-associated lymphoid tissue (MALT) lymphoma    Pre-diabetes    Pulmonary emphysema (HCC)    Stomach ulcer     SURGICAL  HISTORY: Past Surgical History:  Procedure Laterality Date   CATARACT EXTRACTION W/PHACO Left 03/07/2022   Procedure: CATARACT EXTRACTION PHACO AND INTRAOCULAR LENS PLACEMENT (IOC) LEFT VIVITY TORIC LENS 10.41 00:55.7;  Surgeon: Galen Manila, MD;  Location: [facility];  Service: Ophthalmology;  Laterality: Left;   CATARACT EXTRACTION W/PHACO Right 03/28/2022   Procedure: CATARACT EXTRACTION PHACO AND INTRAOCULAR LENS PLACEMENT (IOC) RIGHT VIVITY LENS 6.68 00:50.0;  Surgeon: Galen Manila, MD;  Location: [facility];  Service: Ophthalmology;  Laterality: Right;   COLONOSCOPY     COLONOSCOPY N/A 06/19/2022   Procedure: COLONOSCOPY;  Surgeon: Jaynie Collins, DO;  Location: [facility] ENDOSCOPY;  Service: Gastroenterology;  Laterality: N/A;   COLONOSCOPY WITH PROPOFOL N/A 08/20/2018   Procedure: COLONOSCOPY WITH PROPOFOL;  Surgeon: Christena Deem, MD;  Location: [facility] ENDOSCOPY;  Service: Endoscopy;  Laterality: N/A;   COLONOSCOPY WITH PROPOFOL N/A 12/19/2018   Procedure: COLONOSCOPY WITH PROPOFOL;  Surgeon: Christena Deem, MD;  Location: [facility] ENDOSCOPY;  Service: Endoscopy;  Laterality: N/A;   CORONARY ATHERECTOMY N/A 02/03/2019   Procedure: CORONARY ATHERECTOMY;  Surgeon: Yvonne Kendall, MD;  Location: [facility] INVASIVE CV LAB;  Service: Cardiovascular;  Laterality: N/A;   CORONARY STENT INTERVENTION N/A 01/21/2019   Procedure: CORONARY STENT INTERVENTION;  Surgeon: Yvonne Kendall, MD;  Location: [facility] INVASIVE CV LAB;  Service: Cardiovascular;  Laterality: N/A;   CORONARY STENT INTERVENTION N/A 02/03/2019   Procedure: CORONARY STENT INTERVENTION;  Surgeon: Yvonne Kendall, MD;  Location: [facility] INVASIVE CV LAB;  Service: Cardiovascular;  Laterality: N/A;   CORONARY ULTRASOUND/IVUS N/A 02/03/2019   Procedure: Intravascular Ultrasound/IVUS;  Surgeon: Yvonne Kendall, MD;  Location: [facility] INVASIVE CV LAB;  Service: Cardiovascular;  Laterality: N/A;   CYSTOSCOPY WITH  URETHRAL DILATATION N/A 08/16/2018   Procedure: CYSTOSCOPY WITH URETHRAL DILATATION;  Surgeon: Sondra Come, MD;  Location: [facility] ORS;  Service: Urology;  Laterality: N/A;   ESOPHAGOGASTRODUODENOSCOPY N/A 01/29/2017   Procedure: ESOPHAGOGASTRODUODENOSCOPY (EGD);  Surgeon: Christena Deem, MD;  Location: [facility] ENDOSCOPY;  Service: Endoscopy;  Laterality: N/A;   ESOPHAGOGASTRODUODENOSCOPY N/A 06/19/2022   Procedure: ESOPHAGOGASTRODUODENOSCOPY (EGD);  Surgeon: Jaynie Collins, DO;  Location: [facility] ENDOSCOPY;  Service: Gastroenterology;  Laterality: N/A;   ESOPHAGOGASTRODUODENOSCOPY (EGD) WITH PROPOFOL N/A 07/10/2016   Procedure: ESOPHAGOGASTRODUODENOSCOPY (EGD) WITH PROPOFOL;  Surgeon: Christena Deem, MD;  Location: [facility] ENDOSCOPY;  Service: Endoscopy;  Laterality: N/A;   ESOPHAGOGASTRODUODENOSCOPY (EGD) WITH PROPOFOL N/A 12/19/2018   Procedure: ESOPHAGOGASTRODUODENOSCOPY (EGD) WITH PROPOFOL;  Surgeon: Christena Deem, MD;  Location: [facility] ENDOSCOPY;  Service: Endoscopy;  Laterality: N/A;   left elbow repair Left    LEFT HEART CATH AND CORONARY ANGIOGRAPHY Left 01/21/2019   Procedure: LEFT HEART CATH AND CORONARY ANGIOGRAPHY;  Surgeon: Yvonne Kendall, MD;  Location: [facility] INVASIVE CV LAB;  Service: Cardiovascular;  Laterality: Left;   LYMPH NODE DISSECTION Left 02/08/2024   Procedure: LYMPH NODE DISSECTION;  Surgeon: Carolan Shiver, MD;  Location: [facility] ORS;  Service: General;  Laterality: Left;   PORTACATH PLACEMENT N/A 02/13/2024   Procedure: INSERTION, TUNNELED CENTRAL VENOUS DEVICE, WITH PORT;  Surgeon: Carolan Shiver, MD;  Location: [facility] ORS;  Service: General;  Laterality: N/A;   SPLENECTOMY  1957   TONSILLECTOMY  as a child    SOCIAL HISTORY: Social History   Socioeconomic History   Marital status: Married    Spouse name: Marylu Lund   Number of children: 2   Years of education: Not on file   Highest education level: Master's degree (e.g., MA, MS, MEng, MEd,  MSW, MBA)  Occupational History   Occupation: retired     Comment: Psychologist, occupational  Tobacco Use   Smoking status: Never   Smokeless tobacco: Never  Vaping Use   Vaping status: Never Used  Substance and Sexual Activity   Alcohol use: Not Currently   Drug use: No   Sexual activity: Yes    Partners: Female  Other Topics Concern   Not on file  Social History Narrative   Married, 2 sons, 1 in [place], [place] and 1 in [employer], stationary bike      No smoking.  Occasional alcohol.  Lives at home.  Retired Psychologist, occupational.   Social Drivers of Corporate investment banker Strain: Low Risk  (01/29/2024)   Received from [facility]   Overall Financial Resource Strain (CARDIA)    Difficulty of Paying Living Expenses: Not hard at all  Food Insecurity: No Food Insecurity (03/04/2024)   Hunger Vital Sign    Worried About Running Out of Food in the Last Year: Never true    Ran Out of Food in the Last Year: Never true  Transportation Needs: No Transportation Needs (03/04/2024)   PRAPARE - Administrator, Civil Service (Medical): No    Lack of Transportation (Non-Medical): No  Physical Activity: Insufficiently Active (07/17/2023)   Exercise Vital Sign    Days of Exercise per Week: 4 days    Minutes of Exercise per Session: 30 min  Stress: No Stress Concern Present (07/17/2023)   [employer] of Occupational Health - Occupational Stress Questionnaire    Feeling of Stress : Only a little  Social Connections: Moderately Integrated (03/04/2024)   Social Connection and Isolation Panel [NHANES]    Frequency of Communication with Friends and Family: Three times a week    Frequency of Social Gatherings with Friends and Family: Three times a week    Attends Religious Services: More than 4 times per year    Active Member of Clubs or Organizations: No    Attends Banker Meetings: Never    Marital Status: Married  Catering manager Violence: Not At Risk (03/04/2024)    Humiliation, Afraid, Rape, and Kick questionnaire    Fear of Current or Ex-Partner: No    Emotionally Abused: No    Physically Abused: No    Sexually Abused: No    FAMILY HISTORY: Family History  Adopted: Yes  Problem Relation Age of Onset   Heart attack Mother 53   Pancreatic cancer Father        died in WWII - pt adopted   Hypertension Sister    Hyperlipidemia Sister    Hypertension Brother    Hyperlipidemia Brother    Prostate cancer Neg Hx    Kidney cancer Neg Hx    Bladder Cancer Neg Hx     ALLERGIES:  has no known allergies.  MEDICATIONS:  Current Facility-Administered Medications  Medication Dose Route Frequency Provider Last Rate Last Admin   0.9 %  sodium chloride infusion  10 mL/hr Intravenous Once Mumma, Carollee Herter, MD       acetaminophen (TYLENOL) tablet 650 mg  650 mg Oral Q6H PRN Verdene Lennert, MD  650 mg at 03/04/24 2018   Or   acetaminophen (TYLENOL) suppository 650 mg  650 mg Rectal Q6H PRN Verdene Lennert, MD       acyclovir (ZOVIRAX) 200 MG capsule 400 mg  400 mg Oral BID Verdene Lennert, MD   400 mg at 03/05/24 1030   allopurinol (ZYLOPRIM) tablet 300 mg  300 mg Oral BID Verdene Lennert, MD   300 mg at 03/05/24 1028   amiodarone (PACERONE) tablet 200 mg  200 mg Oral Daily Verdene Lennert, MD   200 mg at 03/05/24 1028   atorvastatin (LIPITOR) tablet 40 mg  40 mg Oral Daily Verdene Lennert, MD   40 mg at 03/05/24 1028   HYDROcodone-acetaminophen (NORCO/VICODIN) 5-325 MG per tablet 1-2 tablet  1-2 tablet Oral Q6H PRN Verdene Lennert, MD   2 tablet at 03/05/24 1637   metoprolol tartrate (LOPRESSOR) tablet 50 mg  50 mg Oral BID Verdene Lennert, MD   50 mg at 03/05/24 1029   ondansetron (ZOFRAN) tablet 4 mg  4 mg Oral Q6H PRN Verdene Lennert, MD       Or   ondansetron (ZOFRAN) injection 4 mg  4 mg Intravenous Q6H PRN Verdene Lennert, MD       pantoprazole (PROTONIX) EC tablet 40 mg  40 mg Oral BID AC [name], MD   40 mg at 03/05/24 1637   sodium  chloride flush (NS) 0.9 % injection 3 mL  3 mL Intravenous Q12H Verdene Lennert, MD   3 mL at 03/05/24 1030    PHYSICAL EXAMINATION:   Vitals:   03/05/24 0931 03/05/24 1445  BP: (!) 155/68 (!) 100/54  Pulse: 86 68  Resp: (!) 21 18  Temp:  98 F (36.7 C)  SpO2: 99% 98%   Filed Weights   03/04/24 1407  Weight: 235 lb (106.6 kg)   Bilateral extensive neck adenopathy and also bilateral axilla adenopathy left more than right.  Right upper extremity swelling noted compared to left.  Pulses intact.  Physical Exam Vitals and nursing note reviewed.  HENT:     Head: Normocephalic and atraumatic.     Mouth/Throat:     Pharynx: Oropharynx is clear.  Eyes:     Extraocular Movements: Extraocular movements intact.     Pupils: Pupils are equal, round, and reactive to light.  Cardiovascular:     Rate and Rhythm: Normal rate and regular rhythm.  Pulmonary:     Comments: Decreased breath sounds bilaterally.  Abdominal:     Palpations: Abdomen is soft.  Musculoskeletal:        General: Normal range of motion.     Cervical back: Normal range of motion.     Right lower leg: Edema present.     Left lower leg: Edema present.  Skin:    General: Skin is warm.  Neurological:     General: No focal deficit present.     Mental Status: He is alert and oriented to person, place, and time.  Psychiatric:        Behavior: Behavior normal.        Judgment: Judgment normal.     LABORATORY DATA:  I have reviewed the data as listed Lab Results  Component Value Date   WBC 7.3 03/05/2024   HGB 8.6 (L) 03/05/2024   HCT 24.4 (L) 03/05/2024   MCV 96.8 03/05/2024   PLT 365 03/05/2024   Recent Labs    02/11/24 0922 02/19/24 0937 02/26/24 1353 02/29/24 0512 03/04/24 1230 03/05/24 0446  NA  135 132*   < > 130* 129* 131*  K 3.8 4.0   < > 4.5 4.6 4.2  CL 99 95*   < > 98 93* 97*  CO2 24 26   < > 21* 22 24  GLUCOSE 137* 126*   < > 120* 128* 99  BUN 19 33*   < > 26* 33* 33*  CREATININE 1.06  1.18   < > 0.89 1.44* 1.27*  CALCIUM 8.7* 8.9   < > 8.2* 8.8* 9.0  GFRNONAA >60 >60   < > >60 49* 57*  PROT 6.4* 6.0*  --   --  5.6*  --   ALBUMIN 3.1* 2.9*  --   --  2.6*  --   AST 48* 49*  --   --  50*  --   ALT 31 42  --   --  23  --   ALKPHOS 52 48  --   --  55  --   BILITOT 0.8 1.2  --   --  0.7  --    < > = values in this interval not displayed.    RADIOGRAPHIC STUDIES: I have personally reviewed the radiological images as listed and agreed with the findings in the report. US Venous Img Lower Unilateral Right (DVT) Result Date: 03/05/2024 CLINICAL DATA:  Right leg swelling. EXAM: RIGHT LOWER EXTREMITY VENOUS DOPPLER ULTRASOUND TECHNIQUE: Gray-scale sonography with graded compression, as well as color Doppler and duplex ultrasound were performed to evaluate the lower extremity deep venous systems from the level of the common femoral vein and including the common femoral, femoral, profunda femoral, popliteal and calf veins including the posterior tibial, peroneal and gastrocnemius veins when visible. The superficial great saphenous vein was also interrogated. Spectral Doppler was utilized to evaluate flow at rest and with distal augmentation maneuvers in the common femoral, femoral and popliteal veins. COMPARISON:  None Available. FINDINGS: Contralateral Common Femoral Vein: Respiratory phasicity is normal and symmetric with the symptomatic side. No evidence of thrombus. Normal compressibility. Common Femoral Vein: No evidence of thrombus. Normal compressibility, respiratory phasicity and response to augmentation. Saphenofemoral Junction: No evidence of thrombus. Normal compressibility and flow on color Doppler imaging. Profunda Femoral Vein: No evidence of thrombus. Normal compressibility and flow on color Doppler imaging. Femoral Vein: No evidence of thrombus. Normal compressibility, respiratory phasicity and response to augmentation. Popliteal Vein: No evidence of thrombus. Normal  compressibility, respiratory phasicity and response to augmentation. Calf Veins: Visualized right deep calf veins are patent without thrombus. Other Findings:  None. IMPRESSION: Negative for deep venous thrombosis in right lower extremity. Electronically Signed   By: Richarda Overlie M.D.   On: 03/05/2024 15:29   US Venous Img Upper Uni Left (DVT) Result Date: 02/27/2024 CLINICAL DATA:  Swelling.  History of lymphoma EXAM: Left UPPER EXTREMITY VENOUS DOPPLER ULTRASOUND TECHNIQUE: Gray-scale sonography with graded compression, as well as color Doppler and duplex ultrasound were performed to evaluate the upper extremity deep venous system from the level of the subclavian vein and including the jugular, axillary, basilic, radial, ulnar and upper cephalic vein. Spectral Doppler was utilized to evaluate flow at rest and with distal augmentation maneuvers. COMPARISON:  None Available. FINDINGS: Contralateral Subclavian Vein: Respiratory phasicity is normal and symmetric with the symptomatic side. No evidence of thrombus. Normal compressibility. Internal Jugular Vein: No evidence of thrombus. Normal compressibility, respiratory phasicity and response to augmentation. Subclavian Vein: No evidence of thrombus. Normal compressibility, respiratory phasicity and response to augmentation. Axillary Vein: No evidence  of thrombus. Normal compressibility, respiratory phasicity and response to augmentation. Cephalic Vein: No evidence of thrombus. Normal compressibility, respiratory phasicity and response to augmentation. Basilic Vein: No evidence of thrombus. Normal compressibility, respiratory phasicity and response to augmentation. Brachial Veins: No evidence of thrombus. Normal compressibility, respiratory phasicity and response to augmentation. Radial Veins: No evidence of thrombus. Normal compressibility, respiratory phasicity and response to augmentation. Ulnar Veins: No evidence of thrombus. Normal compressibility, respiratory  phasicity and response to augmentation. Venous Reflux:  None visualized. Other Findings: Note is made of numerous abnormal nodes identified in the visualized neck. Please correlate with prior workup. Scattered soft tissue edema as well along the forearm. IMPRESSION: No evidence of DVT within the left upper extremity. Electronically Signed   By: Karen Kays M.D.   On: 02/27/2024 14:52   DG Lumbar Spine 2-3 Views Result Date: 02/26/2024 CLINICAL DATA:  Back pain EXAM: LUMBAR SPINE - 2-3 VIEW COMPARISON:  CT 01/31/2024 FINDINGS: Degenerative disc disease most pronounced at L5-S1 with disc space narrowing, spurring, and vacuum disc. Diffuse degenerative facet disease, most pronounced in the lower lumbar spine. Normal alignment. No fracture. IMPRESSION: Degenerative disc and facet disease.  No acute bony abnormality. Electronically Signed   By: Charlett Nose M.D.   On: 02/26/2024 23:27   CT Angio Chest PE W/Cm &/Or Wo Cm Result Date: 02/26/2024 CLINICAL DATA:  PE suspected. High probability. * Tracking Code: BO * EXAM: CT ANGIOGRAPHY CHEST WITH CONTRAST TECHNIQUE: Multidetector CT imaging of the chest was performed using the standard protocol during bolus administration of intravenous contrast. Multiplanar CT image reconstructions and MIPs were obtained to evaluate the vascular anatomy. RADIATION DOSE REDUCTION: This exam was performed according to the departmental dose-optimization program which includes automated exposure control, adjustment of the mA and/or kV according to patient size and/or use of iterative reconstruction technique. CONTRAST:  75mL OMNIPAQUE IOHEXOL 350 MG/ML SOLN COMPARISON:  PET-CT 02/05/2024 FINDINGS: Cardiovascular: No filling defects within the pulmonary arteries to suggest acute pulmonary embolism. Mediastinum/Nodes: Bulky mediastinum and LEFT axillary lymphadenopathy. RIGHT lower paratracheal node measures 3.4 cm short axis compared to 3.1 cm on PET-CT. LEFT supraclavicular node  measures 3.2 cm compared to 2.3 cm. LEFT axillary nodal mass with bulky nodes. Individual node measures up to 3.4 cm compared to 2.3 cm. Node beneath the pectoralis muscle measures 2.7 cm compared to 2.0 cm. Visually nodes appear larger. Lungs/Pleura: Interstitial edema pattern. no  Infarction.  No pneumonia. 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 Multiple small spleen spanning diaphragm. Adrenal glands normal. Musculoskeletal: No aggressive osseous lesion. Review of the MIP images confirms the above findings. IMPRESSION: 1. No acute pulmonary embolism. 2. No acute pulmonary parenchymal findings. 3. mild interstitial edema pattern. 4. Bulky mediastinal, supraclavicular and LEFT axillary adenopathy. The bulky LEFT axillary adenopathy is increased in size. Electronically Signed   By: Genevive Bi M.D.   On: 02/26/2024 17:07   DG Chest Port 1 View Result Date: 02/26/2024 CLINICAL DATA:  AFib with RVR EXAM: PORTABLE CHEST 1 VIEW COMPARISON:  Chest radiograph dated 02/13/2024 FINDINGS: Lines/tubes: Right chest wall port tip projects over the SVC. Lungs: Unchanged asymmetric elevation of the right hemidiaphragm. Low lung volumes with bronchovascular crowding. Hazy and patchy left lower lung opacity. Pleura: No pneumothorax or pleural effusion. Heart/mediastinum: Similar mildly enlarged cardiomediastinal silhouette. Bones: No acute osseous abnormality. IMPRESSION: Low lung volumes with bronchovascular crowding. Hazy and patchy left lower lung opacity, likely atelectasis. Aspiration or  pneumonia can be considered in the appropriate clinical setting. Electronically Signed   By: Agustin Cree M.D.   On: 02/26/2024 16:30   DG Chest Port 1 View Result Date: 02/13/2024 CLINICAL DATA:  Recurrent lymphoma, port catheter placement EXAM: PORTABLE CHEST 1 VIEW COMPARISON:  07/03/2018 FINDINGS: Right IJ power port catheter tip mid SVC level. No effusion or  pneumothorax. Low lung volumes. Bibasilar atelectasis. Negative for edema. Mediastinal and hilar adenopathy better appreciated by comparison PET-CT. Aorta atherosclerotic. Trachea midline. IMPRESSION: 1. Right IJ port catheter tip mid SVC level. No pneumothorax. 2. Low lung volumes with bibasilar atelectasis. Electronically Signed   By: Judie Petit.  Shick M.D.   On: 02/13/2024 13:40   DG C-Arm 1-60 Min-No Report Result Date: 02/13/2024 Fluoroscopy was utilized by the requesting physician.  No radiographic interpretation.   ECHOCARDIOGRAM COMPLETE Result Date: 02/12/2024    ECHOCARDIOGRAM REPORT   Patient Name:   JAMILLE YOSHINO Date of Exam: 02/12/2024 Medical Rec #:  [id number]        Height:       70.5 in Accession #:    [id number]       Weight:       235.0 lb Date of Birth:  [birth year]        BSA:          2.247 m Patient Age:    78 years         BP:           116/72 mmHg Patient Gender: M                HR:           86 bpm. Exam Location:  [place] Procedure: 2D Echo, Color Doppler, Cardiac Doppler and Intracardiac            Opacification Agent (Both Spectral and Color Flow Doppler were            utilized during procedure). Indications:    R06.9 DOE; Z51.11 Encounter for antineoplastic chemotheraphy  History:        Patient has prior history of Echocardiogram examinations, most                 recent 07/16/2023. CHF, CAD, Signs/Symptoms:Dyspnea and Shortness                 of Breath; Risk Factors:Hypertension, Dyslipidemia and                 Non-Smoker.  Sonographer:    Ilda Mori MHA, BS, RDCS Referring Phys: [phone] CHRISTOPHER END  Sonographer Comments: Technically challenging study due to limited acoustic windows and patient is obese. Image acquisition challenging due to patient body habitus. IMPRESSIONS  1. Left ventricular ejection fraction, by estimation, is 60 to 65%. The left ventricle has normal function. The left ventricle has no regional wall motion abnormalities. Left ventricular diastolic  parameters are consistent with Grade I diastolic dysfunction (impaired relaxation).  2. Right ventricular systolic function is normal. The right ventricular size is normal. There is normal pulmonary artery systolic pressure. The estimated right ventricular systolic pressure is 21.8 mmHg.  3. Left atrial size was mildly dilated.  4. The mitral valve is normal in structure. No evidence of mitral valve regurgitation. No evidence of mitral stenosis.  5. The aortic valve is normal in structure. There is mild calcification of the aortic valve. Aortic valve regurgitation is not visualized. Aortic valve sclerosis is present, with no evidence of aortic valve stenosis.  6. The inferior vena cava is normal in size with greater than 50% respiratory variability, suggesting right atrial pressure of 3 mmHg. FINDINGS  Left Ventricle: Left ventricular ejection fraction, by estimation, is 60 to 65%. The left ventricle has normal function. The left ventricle has no regional wall motion abnormalities. Strain was performed and the global longitudinal strain is indeterminate. The left ventricular internal cavity size was normal in size. There is no left ventricular hypertrophy. Left ventricular diastolic parameters are consistent with Grade I diastolic dysfunction (impaired relaxation). Right Ventricle: The right ventricular size is normal. No increase in right ventricular wall thickness. Right ventricular systolic function is normal. There is normal pulmonary artery systolic pressure. The tricuspid regurgitant velocity is 2.05 m/s, and  with an assumed right atrial pressure of 5 mmHg, the estimated right ventricular systolic pressure is 21.8 mmHg. Left Atrium: Left atrial size was mildly dilated. Right Atrium: Right atrial size was normal in size. Pericardium: There is no evidence of pericardial effusion. Mitral Valve: The mitral valve is normal in structure. No evidence of mitral valve regurgitation. No evidence of mitral valve  stenosis. Tricuspid Valve: The tricuspid valve is normal in structure. Tricuspid valve regurgitation is mild . No evidence of tricuspid stenosis. Aortic Valve: The aortic valve is normal in structure. There is mild calcification of the aortic valve. Aortic valve regurgitation is not visualized. Aortic valve sclerosis is present, with no evidence of aortic valve stenosis. Aortic valve mean gradient  measures 4.0 mmHg. Aortic valve peak gradient measures 7.0 mmHg. Pulmonic Valve: The pulmonic valve was normal in structure. Pulmonic valve regurgitation is not visualized. No evidence of pulmonic stenosis. Aorta: The aortic root is normal in size and structure. Venous: The inferior vena cava is normal in size with greater than 50% respiratory variability, suggesting right atrial pressure of 3 mmHg. IAS/Shunts: No atrial level shunt detected by color flow Doppler. Additional Comments: 3D was performed not requiring image post processing on an independent workstation and was indeterminate.  LEFT VENTRICLE PLAX 2D LVIDd:         4.60 cm Diastology LVIDs:         3.80 cm LV e' medial:    8.92 cm/s LV PW:         0.80 cm LV E/e' medial:  6.1 LV IVS:        0.80 cm LV e' lateral:   8.49 cm/s                        LV E/e' lateral: 6.4  RIGHT VENTRICLE RV S prime:     17.00 cm/s TAPSE (M-mode): 3.3 cm LEFT ATRIUM             Index LA diam:        4.90 cm 2.18 cm/m LA Vol (A2C):   56.8 ml 25.28 ml/m LA Vol (A4C):   34.6 ml 15.40 ml/m LA Biplane Vol: 44.5 ml 19.80 ml/m  AORTIC VALVE AV Vmax:           132.00 cm/s AV Vmean:          87.100 cm/s AV VTI:            0.211 m AV Peak Grad:      7.0 mmHg AV Mean Grad:      4.0 mmHg LVOT Vmax:         121.00 cm/s LVOT Vmean:        78.100 cm/s LVOT VTI:  0.205 m LVOT/AV VTI ratio: 0.97  AORTA Ao Root diam: 3.50 cm Ao Asc diam:  3.80 cm MITRAL VALVE               TRICUSPID VALVE MV Area (PHT): 3.42 cm    TR Peak grad:   16.8 mmHg MV Decel Time: 222 msec    TR Vmax:         205.00 cm/s MV E velocity: 54.50 cm/s MV A velocity: 93.80 cm/s  SHUNTS MV E/A ratio:  0.58        Systemic VTI: 0.20 m Julien Nordmann MD Electronically signed by Julien Nordmann MD Signature Date/Time: 02/12/2024/7:32:02 PM    Final    NM PET Image Restage (PS) Skull Base to Thigh (F-18 FDG) Result Date: 02/09/2024 CLINICAL DATA:  Subsequent treatment strategy for recurrent lymphoma. EXAM: NUCLEAR MEDICINE PET SKULL BASE TO THIGH TECHNIQUE: 12.51 mCi F-18 FDG was injected intravenously. Full-ring PET imaging was performed from the skull base to thigh after the radiotracer. CT data was obtained and used for attenuation correction and anatomic localization. Fasting blood glucose: 109 mg/dl COMPARISON:  Prior PET-CT 11/11/2018 and recent CT scan 01/31/2024 FINDINGS: Mediastinal blood pool activity: SUV max 3.27 Liver activity: SUV max 4.51 NECK: Extensive multi station hypermetabolic cervical lymphadenopathy. Many of these nodes demonstrate low attenuation suggesting necrosis. Index right level 2 lymph node on image 24/6 measures 27 mm and SUV max is 16.28. Left-sided level 2 lymph node on image 18/6 measures 20 mm and SUV max is 12.8. Incidental CT findings: Bilateral carotid artery calcifications. CHEST: Extensive supraclavicular and left axillary lymphadenopathy. Many of the left axillary lymph nodes are partially necrotic. Index left axillary node on image 54/6 measures 26 mm and SUV max is 15.73. Extensive hypermetabolic mediastinal and hilar lymphadenopathy. 3 cm precarinal lymph node on image 49/6 has an SUV max of 16.95. No worrisome hypermetabolic pulmonary lesions. Incidental CT findings: Stable advanced aortic and three-vessel coronary artery calcifications. ABDOMEN/PELVIS: Extensive mesenteric and retroperitoneal hypermetabolic lymphadenopathy. Index 3.6 cm largely necrotic node on image 93/6 has an SUV max of 10.0. 21 mm right retroperitoneal lymph node on image 92/6 has an SUV max of 21.37. 11 mm right  obturator node on image 136/6 has an SUV max of 14.47. 11 mm left inguinal node on image 151/6 has an SUV max of 11.79. Incidental CT findings: Stable advanced vascular disease but no aortic aneurysm. 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 Stable simple bilateral renal cysts not requiring any further imaging evaluation or follow-up. Stable right renal calculus. SKELETON: No findings suspicious for osseous lymphoma. Incidental CT findings: None. IMPRESSION: 1. Extensive partially necrotic hypermetabolic lymphadenopathy involving the neck, chest, abdomen and pelvis as detailed above. Very similar findings when compared to the prior PET-CT from 2019. [place] 5. 2. No findings suspicious for osseous lymphoma. 3. 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 4. Stable advanced vascular disease. 5. Aortic atherosclerosis. Electronically Signed   By: Rudie Meyer M.D.   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  # Diffuse large B-cell lymphoma-EBV associated-non-GCB subtype currently status post 1 treatment of rituximab approximately 2 weeks ago.  Clinically noted to have progression of disease based on lymphadenopathy and also rising LDH.  # Severe anemia hemoglobin 6.5-on admission suspect secondary  to GI bleed/and also underlying lymphoma.  Rule out hemolysis.  S/p EGD-  # Acute kidney injury-creatinine up up to 1.47 on admission status post gentle hydration.  # Bilateral extremity edema-likely third spacing.  Right lower extremity Doppler-negative.  # A-fib-continue metoprolol/and amiodarone.  Currently-Eliquis on hold.  # Recommendations/plan:  # I discussed with patient and wife that unfortunately patient's malignancy seems to be progressing quite  rapidly needing to institute treatment ASAP.  Given the patient's tenuous clinical situation/other logistical challenges recommend starting chemotherapy with R-CHOP.  Will plan to start the patient on R-Pola-CHP with subsequent cycles.  # Will monitor closely for tumor lysis.  Will check uric acid mag and also phosphorus.  G6PD workup pending.  Start patient on allopurinol.  # Thank you Dr.Anderson for allowing me to participate in the care of your pleasant patient. Please do not hesitate to contact me with questions or concerns in the interim.  If patient clinically stable-postchemotherapy/labs adequate patient could be discharged for the weekend.  Plan is to have the patient come back on March 31 to the clinic for labs and also growth factor injection.-Care was discussed the patient and his wife in detail.  Also discussed with the primary team and also pharmacy staff.     Earna Coder, MD 03/05/2024 7:52 PM

## 2024-03-05 NOTE — Telephone Encounter (Signed)
 The patient's wife would like Dr. Okey Dupre to know that the patient is at [facility] in room 208, in case he would like to visit.

## 2024-03-05 NOTE — Anesthesia Preprocedure Evaluation (Signed)
 Anesthesia Evaluation  Patient identified by MRN, date of birth, ID band Patient awake    Reviewed: Allergy & Precautions, NPO status , Patient's Chart, lab work & pertinent test results  History of Anesthesia Complications Negative for: history of anesthetic complications  Airway Mallampati: III  TM Distance: >3 FB Neck ROM: full    Dental no notable dental hx.    Pulmonary shortness of breath, COPD,  COPD inhaler   Pulmonary exam normal        Cardiovascular hypertension, On Medications (-) angina + CAD, + Peripheral Vascular Disease and +CHF  + dysrhythmias Atrial Fibrillation      Neuro/Psych  PSYCHIATRIC DISORDERS Anxiety Depression     Neuromuscular disease    GI/Hepatic Neg liver ROS, hiatal hernia, PUD,GERD  Medicated,,  Endo/Other  negative endocrine ROS    Renal/GU Renal disease  negative genitourinary   Musculoskeletal   Abdominal   Peds  Hematology  (+) Blood dyscrasia, anemia   Anesthesia Other Findings Past Medical History: 01/21/2019: (HFpEF) heart failure with preserved ejection fraction  (HCC)     Comment:  a.) LHC 01/21/2019: EF 55-60%, LVEDP 25-30 mmHg No date: Anxiety No date: Aortic atherosclerosis (HCC) 07/16/2023: Ascending aorta dilatation (HCC)     Comment:  a.) TTE 07/16/2023: 38 mm No date: Barrett's esophagus No date: BPH (benign prostatic hyperplasia) 10/2018: CAD (coronary artery disease)     Comment:  a.) ETT 11/06/2018: 1mm horizontal inflat ST depression,              freq PVC's -> intermediate risk; b.) cCTA 12/23/2018:               Ca2+ 2408 (93rd %'ile); FFR 0.6 mLAD, 0.87 mLCx, 0.77               dLCx --> cath recommended; c.) LHC/PCI 01/01/2019:  40%               pLAD, 85% mLAD, 70% D2, 70% pLCx (2.75 x 15 mm Resolute               Onyx DES), RCA min irregs -- staged PCI; d.) staged PCI               01/21/2019: orbital athrectomy + 3.0 x 15 mm Resolute                Onyx DES mLAD No date: Chronic heart failure with preserved ejection fraction (HCC) No date: Colon polyps 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 pylori infection No date: H/O urethral stricture No date: Hemorrhoids No date: Hiatal hernia 2023: History of bilateral cataract extraction 1957: History of splenectomy No date: Hypertension No date: Mucosa-associated lymphoid tissue (MALT) lymphoma No date: Pre-diabetes No date: Pulmonary emphysema (HCC) No date: Stomach ulcer  Past Surgical History: 03/07/2022: CATARACT EXTRACTION W/PHACO; Left     Comment:  Procedure: CATARACT EXTRACTION PHACO AND INTRAOCULAR               LENS PLACEMENT (IOC) LEFT VIVITY TORIC LENS 10.41               00:55.7;  Surgeon: Galen Manila, MD;  Location:               [facility];  Service: Ophthalmology;                Laterality: Left; 03/28/2022: CATARACT EXTRACTION W/PHACO; Right  Comment:  Procedure: CATARACT EXTRACTION PHACO AND INTRAOCULAR               LENS PLACEMENT (IOC) RIGHT VIVITY LENS 6.68 00:50.0;                Surgeon: Galen Manila, MD;  Location: [facility]              [facility];  Service: Ophthalmology;  Laterality: Right; No date: COLONOSCOPY 06/19/2022: COLONOSCOPY; N/A     Comment:  Procedure: COLONOSCOPY;  Surgeon: Jaynie Collins,              DO;  Location: [facility] ENDOSCOPY;  Service:               Gastroenterology;  Laterality: N/A; 08/20/2018: COLONOSCOPY WITH PROPOFOL; N/A     Comment:  Procedure: COLONOSCOPY WITH PROPOFOL;  Surgeon:               Christena Deem, MD;  Location: [facility] ENDOSCOPY;                Service: Endoscopy;  Laterality: N/A; 12/19/2018: COLONOSCOPY WITH PROPOFOL; N/A     Comment:  Procedure: COLONOSCOPY WITH PROPOFOL;  Surgeon:               Christena Deem, MD;  Location: [facility] ENDOSCOPY;                Service: Endoscopy;  Laterality:  N/A; 02/03/2019: CORONARY ATHERECTOMY; N/A     Comment:  Procedure: CORONARY ATHERECTOMY;  Surgeon: Yvonne Kendall, MD;  Location: [facility] INVAS[facility] CV LAB;  Service:              Cardiovascular;  Laterality: N/A; 01/21/2019: CORONARY STENT INTERVENTION; N/A     Comment:  Procedure: CORONARY STENT INTERVENTION;  Surgeon: Yvonne Kendall, MD;  Location: [facility] INVASIVE CV LAB;                Service: Cardiovascular;  Laterality: N/A; 02/03/2019: CORONARY STENT INTERVENTION; N/A     Comment:  Procedure: CORONARY STENT INTERVENTION;  Surgeon: Yvonne Kendall, MD;  Location: [facility] CV [facility];  Service:              Cardiovascular;  Laterality: N/A; 02/03/2019: CORONARY ULTRASOUND/IVUS; N/A     Comment:  Procedure: Intravascular Ultrasound/IVUS;  Surgeon: Yvonne Kendall, MD;  Location: [facility] CV [facility];  Service:              Cardiovascular;  Laterality: N/A; 08/16/2018: CYSTOSCOPY WITH URETHRAL DILATATION; N/A     Comment:  Procedure: CYSTOSCOPY WITH URETHRAL DILATATION;                Surgeon: Sondra Come, MD;  Location: [facility] ORS;                Service: Urology;  Laterality: N/A; 01/29/2017: ESOPHAGOGASTRODUODENOSCOPY; N/A     Comment:  Procedure: ESOPHAGOGASTRODUODENOSCOPY (EGD);  Surgeon:               Christena Deem, MD;  Location: [facility] ENDOSCOPY;                Service: Endoscopy;  Laterality: N/A; 06/19/2022: ESOPHAGOGASTRODUODENOSCOPY;  N/A     Comment:  Procedure: ESOPHAGOGASTRODUODENOSCOPY (EGD);  Surgeon:               Jaynie Collins, DO;  Location: [facility] ENDOSCOPY;                Service: Gastroenterology;  Laterality: N/A; 07/10/2016: ESOPHAGOGASTRODUODENOSCOPY (EGD) WITH PROPOFOL; N/A     Comment:  Procedure: ESOPHAGOGASTRODUODENOSCOPY (EGD) WITH               PROPOFOL;  Surgeon: Christena Deem, MD;  Location:               [facility] ENDOSCOPY;  Service: Endoscopy;  Laterality: N/A; 12/19/2018:  ESOPHAGOGASTRODUODENOSCOPY (EGD) WITH PROPOFOL; N/A     Comment:  Procedure: ESOPHAGOGASTRODUODENOSCOPY (EGD) WITH               PROPOFOL;  Surgeon: Christena Deem, MD;  Location:               [facility] ENDOSCOPY;  Service: Endoscopy;  Laterality: N/A; No date: left elbow repair; Left 01/21/2019: LEFT HEART CATH AND CORONARY ANGIOGRAPHY; Left     Comment:  Procedure: LEFT HEART CATH AND CORONARY ANGIOGRAPHY;                Surgeon: Yvonne Kendall, MD;  Location: [facility] INVASIVE               CV LAB;  Service: Cardiovascular;  Laterality: Left; 02/08/2024: LYMPH NODE DISSECTION; Left     Comment:  Procedure: LYMPH NODE DISSECTION;  Surgeon:               Carolan Shiver, MD;  Location: [facility] ORS;  Service:              General;  Laterality: Left; 02/13/2024: PORTACATH PLACEMENT; N/A     Comment:  Procedure: INSERTION, TUNNELED CENTRAL VENOUS DEVICE,               WITH PORT;  Surgeon: Carolan Shiver, MD;                Location: [facility] ORS;  Service: General;  Laterality: N/A; 1957: SPLENECTOMY No date: TONSILLECTOMY     Comment:  as a child  BMI    Body Mass Index: 33.72 kg/m      Reproductive/Obstetrics negative OB ROS                              Anesthesia Physical Anesthesia Plan  ASA: 3  Anesthesia Plan: General   Post-op Pain Management: Minimal or no pain anticipated   Induction: Intravenous  PONV Risk Score and Plan: 1 and Propofol infusion and TIVA  Airway Management Planned: Natural Airway and Nasal Cannula  Additional Equipment:   Intra-op Plan:   Post-operative Plan:   Informed Consent: I have reviewed the patients History and Physical, chart, labs and discussed the procedure including the risks, benefits and alternatives for the proposed anesthesia with the patient or authorized representative who has indicated his/her understanding and acceptance.     Dental Advisory Given  Plan Discussed with: Anesthesiologist,  CRNA and Surgeon  Anesthesia Plan Comments: (Patient consented for risks of anesthesia including but not limited to:  - adverse reactions to medications - risk of airway placement if required - damage to eyes, teeth, lips or other oral mucosa - nerve damage due to positioning  - sore throat or hoarseness - Damage to heart, brain, nerves, lungs,  other parts of body or loss of life  Patient voiced understanding and assent.)         Anesthesia Quick Evaluation

## 2024-03-05 NOTE — Consult Note (Signed)
 [name], MD [facility]  [address], [address] [place], [place] [address] Phone: [phone] Fax : [phone]  Consultation  Referring Provider:     Dr. Huel Cote Primary Care Physician:  Marjie Skiff, NP Primary Gastroenterologist:  Dr. Timothy Lasso         Reason for Consultation:     Melena  Date of Admission:  03/04/2024 Date of Consultation:  03/05/2024         HPI:   Jesse Norton is a 78 y.o. male with a history of atrial fibrillation on Eliquis.  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  The patient has been followed in the past by Dr. Timothy Lasso and prior to that by Dr. Marva Panda.  He does report that he had biopsies in the past that were sent off to [facility] and he was eventually diagnosed with MALT lymphoma.  He now reports that he has had melena.  He states that he has been having melena for least the last few weeks.  In the emergency department the patient's wife reported that she did not notice him having melena until the day prior to admission.  The patient was recently discharged from the hospital for atrial fibrillation with rapid ventricular rate. The patient's blood counts have shown:  Component     Latest Ref Rng 02/28/2024 02/29/2024 03/04/2024 03/05/2024  Hemoglobin     13.0 - 17.0 g/dL 8.6 (L)  7.8 (L)  6.5 (L)  8.6 (L)   Hemoglobin        6.5 (LL)    HCT     39.0 - 52.0 % 26.4 (L)  23.3 (L)  19.8 (L)  24.4 (L)   HCT        19.8 (L)     The patient did receive 2 units of packed red blood cells since admission.  He reports his last black stool was yesterday.  He also reports that his black stools are diarrhea in consistency.  Past Medical History:  Diagnosis Date   (HFpEF) heart failure with preserved ejection fraction (HCC) 01/21/2019   a.) LHC 01/21/2019: EF 55-60%, LVEDP 25-30 mmHg   Anxiety    Aortic atherosclerosis (HCC)    Ascending aorta dilatation (HCC) 07/16/2023    a.) TTE 07/16/2023: 38 mm   Barrett's esophagus    BPH (benign prostatic hyperplasia)    CAD (coronary artery disease) 10/2018   a.) ETT 11/06/2018: 1mm horizontal inflat ST depression, freq PVC's -> intermediate risk; b.) cCTA 12/23/2018: Ca2+ 2408 (93rd %'ile); FFR 0.6 mLAD, 0.87 mLCx, 0.77 dLCx --> cath recommended; c.) LHC/PCI 01/01/2019:  40% pLAD, 85% mLAD, 70% D2, 70% pLCx (2.75 x 15 mm Resolute Onyx DES), RCA min irregs -- staged PCI; d.) staged PCI 01/21/2019: orbital athrectomy + 3.0 x 15 mm Resolute Onyx DES mLAD   Chronic heart failure with preserved ejection fraction (HCC)    Colon polyps    Duodenal adenoma    Gastritis    GERD (gastroesophageal reflux disease)    Gout    H. pylori infection    H/O urethral stricture    Hemorrhoids    Hiatal hernia    History of bilateral cataract extraction 2023   History of splenectomy 1957   Hypertension    Mucosa-associated lymphoid tissue (MALT) lymphoma    Pre-diabetes    Pulmonary emphysema (HCC)    Stomach ulcer  Past Surgical History:  Procedure Laterality Date   CATARACT EXTRACTION W/PHACO Left 03/07/2022   Procedure: CATARACT EXTRACTION PHACO AND INTRAOCULAR LENS PLACEMENT (IOC) LEFT VIVITY TORIC LENS 10.41 00:55.7;  Surgeon: Galen Manila, MD;  Location: [facility];  Service: Ophthalmology;  Laterality: Left;   CATARACT EXTRACTION W/PHACO Right 03/28/2022   Procedure: CATARACT EXTRACTION PHACO AND INTRAOCULAR LENS PLACEMENT (IOC) RIGHT VIVITY LENS 6.68 00:50.0;  Surgeon: Galen Manila, MD;  Location: [facility];  Service: Ophthalmology;  Laterality: Right;   COLONOSCOPY     COLONOSCOPY N/A 06/19/2022   Procedure: COLONOSCOPY;  Surgeon: Jaynie Collins, DO;  Location: [facility] ENDOSCOPY;  Service: Gastroenterology;  Laterality: N/A;   COLONOSCOPY WITH PROPOFOL N/A 08/20/2018   Procedure: COLONOSCOPY WITH PROPOFOL;  Surgeon: Christena Deem, MD;  Location: [facility] ENDOSCOPY;  Service: Endoscopy;   Laterality: N/A;   COLONOSCOPY WITH PROPOFOL N/A 12/19/2018   Procedure: COLONOSCOPY WITH PROPOFOL;  Surgeon: Christena Deem, MD;  Location: [facility] ENDOSCOPY;  Service: Endoscopy;  Laterality: N/A;   CORONARY ATHERECTOMY N/A 02/03/2019   Procedure: CORONARY ATHERECTOMY;  Surgeon: Yvonne Kendall, MD;  Location: [facility] INVASIVE CV LAB;  Service: Cardiovascular;  Laterality: N/A;   CORONARY STENT INTERVENTION N/A 01/21/2019   Procedure: CORONARY STENT INTERVENTION;  Surgeon: Yvonne Kendall, MD;  Location: [facility] INVASIVE CV LAB;  Service: Cardiovascular;  Laterality: N/A;   CORONARY STENT INTERVENTION N/A 02/03/2019   Procedure: CORONARY STENT INTERVENTION;  Surgeon: Yvonne Kendall, MD;  Location: [facility] INVASIVE CV LAB;  Service: Cardiovascular;  Laterality: N/A;   CORONARY ULTRASOUND/IVUS N/A 02/03/2019   Procedure: Intravascular Ultrasound/IVUS;  Surgeon: Yvonne Kendall, MD;  Location: [facility] INVASIVE CV LAB;  Service: Cardiovascular;  Laterality: N/A;   CYSTOSCOPY WITH URETHRAL DILATATION N/A 08/16/2018   Procedure: CYSTOSCOPY WITH URETHRAL DILATATION;  Surgeon: Sondra Come, MD;  Location: [facility] ORS;  Service: Urology;  Laterality: N/A;   ESOPHAGOGASTRODUODENOSCOPY N/A 01/29/2017   Procedure: ESOPHAGOGASTRODUODENOSCOPY (EGD);  Surgeon: Christena Deem, MD;  Location: [facility] ENDOSCOPY;  Service: Endoscopy;  Laterality: N/A;   ESOPHAGOGASTRODUODENOSCOPY N/A 06/19/2022   Procedure: ESOPHAGOGASTRODUODENOSCOPY (EGD);  Surgeon: Jaynie Collins, DO;  Location: [facility] ENDOSCOPY;  Service: Gastroenterology;  Laterality: N/A;   ESOPHAGOGASTRODUODENOSCOPY (EGD) WITH PROPOFOL N/A 07/10/2016   Procedure: ESOPHAGOGASTRODUODENOSCOPY (EGD) WITH PROPOFOL;  Surgeon: Christena Deem, MD;  Location: [facility] ENDOSCOPY;  Service: Endoscopy;  Laterality: N/A;   ESOPHAGOGASTRODUODENOSCOPY (EGD) WITH PROPOFOL N/A 12/19/2018   Procedure: ESOPHAGOGASTRODUODENOSCOPY (EGD) WITH PROPOFOL;  Surgeon: Christena Deem,  MD;  Location: [facility] ENDOSCOPY;  Service: Endoscopy;  Laterality: N/A;   left elbow repair Left    LEFT HEART CATH AND CORONARY ANGIOGRAPHY Left 01/21/2019   Procedure: LEFT HEART CATH AND CORONARY ANGIOGRAPHY;  Surgeon: Yvonne Kendall, MD;  Location: [facility] INVASIVE CV LAB;  Service: Cardiovascular;  Laterality: Left;   LYMPH NODE DISSECTION Left 02/08/2024   Procedure: LYMPH NODE DISSECTION;  Surgeon: Carolan Shiver, MD;  Location: [facility] ORS;  Service: General;  Laterality: Left;   PORTACATH PLACEMENT N/A 02/13/2024   Procedure: INSERTION, TUNNELED CENTRAL VENOUS DEVICE, WITH PORT;  Surgeon: Carolan Shiver, MD;  Location: [facility] ORS;  Service: General;  Laterality: N/A;   SPLENECTOMY  1957   TONSILLECTOMY     as a child    Prior to Admission medications   Medication Sig Start Date End Date Taking? Authorizing Provider  acyclovir (ZOVIRAX) 400 MG tablet Take 1 tablet (400 mg total) by mouth 2 (two) times daily. 02/19/24   Earna Coder, MD  allopurinol (ZYLOPRIM)  300 MG tablet Take 1 tablet (300 mg total) by mouth 2 (two) times daily. 02/11/24   Earna Coder, MD  amiodarone (PACERONE) 200 MG tablet Take 2 tablets (400 mg total) by mouth 2 (two) times daily for 5 days, THEN 1 tablet (200 mg total) daily. 02/29/24 04/04/24  Pennie Banter, DO  apixaban (ELIQUIS) 5 MG TABS tablet Take 1 tablet (5 mg total) by mouth 2 (two) times daily. Patient not taking: Reported on 03/04/2024 02/29/24   Esaw Grandchild A, DO  atorvastatin (LIPITOR) 40 MG tablet TAKE 1 TABLET(40 MG) BY MOUTH DAILY 01/26/23   Aura Dials T, NP  azithromycin (ZITHROMAX) 250 MG tablet Take one tablet (500 mg) by mouth once daily for 2 days Patient not taking: Reported on 03/04/2024 03/01/24   Esaw Grandchild A, DO  Blood Glucose Monitoring Suppl (ONETOUCH VERIO) w/Device KIT To check blood sugar twice a day with goal fasting in morning <130 and goal 2 hours after meal <180.  Write all checks down for  provider visits. 02/11/24   Cannady, Corrie Dandy T, NP  clobetasol ointment (TEMOVATE) 0.05 % Apply topically as needed. 01/20/22   [provider]  feeding supplement (ENSURE ENLIVE / ENSURE PLUS) LIQD Take 237 mLs by mouth 2 (two) times daily between meals. 02/29/24   Pennie Banter, DO  FLUoxetine (PROZAC) 10 MG tablet Take 1 tablet (10 mg total) by mouth daily. 02/26/24   Cannady, Corrie Dandy T, NP  furosemide (LASIX) 20 MG tablet Take 1 tablet (20 mg total) by mouth daily. 03/01/24   Esaw Grandchild A, DO  glucose blood test strip To check blood sugar twice a day with goal fasting in morning <130 and goal 2 hours after meal <180.  Write all checks down for provider visits. 02/11/24   Cannady, Corrie Dandy T, NP  HYDROcodone-acetaminophen (NORCO/VICODIN) 5-325 MG tablet Take 1 tablet by mouth every 6 (six) hours as needed for up to 10 days. 02/26/24 03/07/24  Aura Dials T, NP  Lancets (ONETOUCH ULTRASOFT) lancets 1 each by Other route 2 (two) times daily. 02/13/24   Cannady, Corrie Dandy T, NP  lidocaine (LIDODERM) 5 % Place 1 patch onto the skin daily. Remove & Discard patch within 12 hours or as directed by MD 02/26/24   Aura Dials T, NP  lidocaine-prilocaine (EMLA) cream Apply on the port. 30 -45 min  prior to port access. 02/11/24   Earna Coder, MD  metoprolol tartrate (LOPRESSOR) 50 MG tablet Take 1 tablet (50 mg total) by mouth 2 (two) times daily. 02/29/24   Pennie Banter, DO  nitroGLYCERIN (NITROSTAT) 0.4 MG SL tablet Place 1 tablet (0.4 mg total) under the tongue every 5 (five) minutes as needed for chest pain. Maximum of 3 doses. 08/25/20   End, Cristal Deer, MD  ondansetron (ZOFRAN) 8 MG tablet One pill every 8 hours as needed for nausea/vomitting. 02/11/24   Earna Coder, MD  oxyCODONE (OXY IR/ROXICODONE) 5 MG immediate release tablet Take 1 tablet (5 mg total) by mouth every 4 (four) hours as needed for severe pain (pain score 7-10). 02/29/24   Pennie Banter, DO  prochlorperazine  (COMPAZINE) 10 MG tablet Take 1 tablet (10 mg total) by mouth every 6 (six) hours as needed for nausea or vomiting. 02/11/24   Earna Coder, MD    Family History  Adopted: Yes  Problem Relation Age of Onset   Heart attack Mother 26   Pancreatic cancer Father        died  in WWII - pt adopted   Hypertension Sister    Hyperlipidemia Sister    Hypertension Brother    Hyperlipidemia Brother    Prostate cancer Neg Hx    Kidney cancer Neg Hx    Bladder Cancer Neg Hx      Social History   Tobacco Use   Smoking status: Never   Smokeless tobacco: Never  Vaping Use   Vaping status: Never Used  Substance Use Topics   Alcohol use: Not Currently   Drug use: No    Allergies as of 03/04/2024   (No Known Allergies)    Review of Systems:    All systems reviewed and negative except where noted in HPI.   Physical Exam:  Vital signs in last 24 hours: Temp:  [97.3 F (36.3 C)-98.3 F (36.8 C)] 97.6 F (36.4 C) (03/25 2232) Pulse Rate:  [70-78] 78 (03/25 2232) Resp:  [12-23] 16 (03/25 2232) BP: (98-128)/(53-86) 106/62 (03/25 2232) SpO2:  [87 %-100 %] 98 % (03/25 2232) Weight:  [106.6 kg] 106.6 kg (03/25 1407) Last BM Date : 03/04/24 General:   Pleasant, cooperative in NAD Head:  Normocephalic and atraumatic. Eyes:   No icterus.   Conjunctiva pink. PERRLA. Ears:  Normal auditory acuity. Neck:  Supple; no masses or thyroidomegaly Lungs: Respirations even and unlabored. Lungs clear to auscultation bilaterally.   No wheezes, crackles, or rhonchi.  Heart:  Regular rate and rhythm;  Without murmur, clicks, rubs or gallops Abdomen:  Soft, nondistended, nontender. Normal bowel sounds. No appreciable masses or hepatomegaly.  No rebound or guarding.  Rectal:  Not performed. Msk:  Symmetrical without gross deformities.   Extremities:  Without edema, cyanosis or clubbing. Neurologic:  Alert and oriented x3;  grossly normal neurologically. Skin:  Intact without significant lesions or  rashes. Cervical Nodes:  No significant cervical adenopathy. Psych:  Alert and cooperative. Normal affect.  LAB RESULTS: Recent Labs    03/04/24 1230 03/04/24 1529 03/05/24 0740  WBC 8.3 8.6 7.3  HGB 6.5* 6.5* 8.6*  HCT 19.8* 19.8* 24.4*  PLT 386 381 365   BMET Recent Labs    03/04/24 1230 03/05/24 0446  NA 129* 131*  K 4.6 4.2  CL 93* 97*  CO2 22 24  GLUCOSE 128* 99  BUN 33* 33*  CREATININE 1.44* 1.27*  CALCIUM 8.8* 9.0   LFT Recent Labs    03/04/24 1230  PROT 5.6*  ALBUMIN 2.6*  AST 50*  ALT 23  ALKPHOS 55  BILITOT 0.7   PT/INR Recent Labs    03/04/24 1529  LABPROT 18.9*  INR 1.6*    STUDIES: No results found.    Impression / Plan:   Assessment: Principal Problem:   Upper GI bleed Active Problems:   Coronary artery disease of native artery of native heart with stable angina pectoris (HCC)   Chronic heart failure with preserved ejection fraction (HCC)   DLBCL (diffuse large B cell lymphoma) (HCC)   Paroxysmal atrial fibrillation (HCC)   Symptomatic anemia   Leg edema, right   AKI (acute kidney injury) (HCC)   Jesse Norton is a 78 y.o. y/o male with melena and anemia on chemotherapy for diffuse large B-cell lymphoma with a history of A-fib on anticoagulation.  I am now being asked to see the patient for his black stools and anemia.  Plan:  The patient will be set up for an upper endoscopy for today.  The patient has been kept n.p.o. after midnight in anticipation of a  procedure today.  The patient has been explained the risks and benefits of the procedure and agrees to having the procedure done today.  The differential diagnosis includes peptic ulcer disease versus esophagitis versus recurrent MALT lymphoma.  PPI IV twice daily  Continue serial CBCs and transfuse PRN Avoid NSAIDs Maintain 2 large-bore IV lines Please page GI with any acute hemodynamic changes, or signs of active GI bleeding   Thank you for involving me in the care of  this patient.      LOS: 1 day   [name], MD, MD. Clementeen Graham 03/05/2024, 8:01 AM,  Pager [phone] 7am-5pm  Check AMION for 5pm -7am coverage and on weekends   Note: This dictation was prepared with Dragon dictation along with smaller phrase technology. Any transcriptional errors that result from this process are unintentional.

## 2024-03-05 NOTE — Telephone Encounter (Signed)
 Labs ordered.

## 2024-03-05 NOTE — Progress Notes (Signed)
 Pt currently out of room for procedure. Will re-attempt.  Elizabeth Palau, PT, DPT, GCS [phone]

## 2024-03-05 NOTE — Care Plan (Signed)
 Patient s/p  EGD Tolerated procedure will MD into talk with patient Patient alert and oriented  Report called to RN

## 2024-03-05 NOTE — Plan of Care (Addendum)
 Received report, sitting up in bed, reports has back pain from fall 1.5 months ago. Reports pain 8/10, given tylenol as orders. States usually takes oxy at home, discussed with AM MD, no new orders noted. Will notify on call MD. Taking fluids well, using urinal. Blood infusing via pump to R AC PIV. No redness/swelling to L PIV. Continue to monitor, aware of NPO at midnight.  Sitting in chair, transferred with one to assist. SOB on exertion.  0200 Transferred to [facility] with 2 to assist. Tolerated well. Small amount of liquid tarry stool. Returned to bed. Aware of NPO status.  0440 Reports back pain has returned, unable to sleep, given meds as ordered. Given heat pack. 0600 Appears to be sleeping when checked.

## 2024-03-05 NOTE — Op Note (Signed)
 [facility] Patient Name: Jesse Norton Procedure Date: 03/05/2024 8:35 AM MRN: [id number] Account #: [id number] Date of Birth: 01-06-1946 Admit Type: Outpatient Age: 78 Room: [facility] ENDO ROOM 4 Gender: Male Note Status: Finalized Instrument Name: Laurette Schimke [id number] Procedure:             Upper GI endoscopy Indications:           Melena Providers:             [name] MD, MD Referring MD:          Dorie Rank. Cannady (Referring MD) Medicines:             Propofol per Anesthesia Complications:         No immediate complications. Procedure:             Pre-Anesthesia Assessment:                        - Prior to the procedure, a History and Physical was                         performed, and patient medications and allergies were                         reviewed. The patient's tolerance of previous                         anesthesia was also reviewed. The risks and benefits                         of the procedure and the sedation options and risks                         were discussed with the patient. All questions were                         answered, and informed consent was obtained. Prior                         Anticoagulants: The patient has taken Eliquis                         (apixaban), last dose was 1 day prior to procedure.                         ASA Grade Assessment: II - A patient with mild                         systemic disease. After reviewing the risks and                         benefits, the patient was deemed in satisfactory                         condition to undergo the procedure.                        After obtaining informed consent, the endoscope was  passed under direct vision. Throughout the procedure,                         the patient's blood pressure, pulse, and oxygen                         saturations were monitored continuously. The Endoscope                         was  introduced through the mouth, and advanced to the                         second part of duodenum. The upper GI endoscopy was                         accomplished without difficulty. The patient tolerated                         the procedure well. Findings:      Patchy, white plaques were found in the lower third of the esophagus.       Cells for cytology were obtained by brushing.      A few erosions with no bleeding and no stigmata of recent bleeding were       found in the gastric antrum.      Two non-bleeding cratered duodenal ulcers with pigmented material were       found in the duodenal bulb and in the first portion of the duodenum. The       largest lesion was 12 mm in largest dimension. Impression:            - Esophageal plaques were found, suspicious for                         candidiasis. Cells for cytology obtained.                        - Erosive gastropathy with no bleeding and no stigmata                         of recent bleeding.                        - Non-bleeding duodenal ulcers with pigmented material. Recommendation:        - Return patient to hospital ward for ongoing care.                        - Resume previous diet.                        - Continue present medications.                        - No biopsies due to patient on anticoagulation.                        - Perform an H. pylori serology. Procedure Code(s):     --- Professional ---  40347, Esophagogastroduodenoscopy, flexible,                         transoral; diagnostic, including collection of                         specimen(s) by brushing or washing, when performed                         (separate procedure) Diagnosis Code(s):     --- Professional ---                        K92.1, Melena (includes Hematochezia)                        K26.9, Duodenal ulcer, unspecified as acute or                         chronic, without hemorrhage or perforation                         K31.89, Other diseases of stomach and duodenum                        K22.9, Disease of esophagus, unspecified CPT copyright 2022 American Medical Association. All rights reserved. The codes documented in this report are preliminary and upon coder review may  be revised to meet current compliance requirements. [name] MD, MD 03/05/2024 9:00:09 AM This report has been signed electronically. Number of Addenda: 0 Note Initiated On: 03/05/2024 8:35 AM Estimated Blood Loss:  Estimated blood loss: none.      [facility]

## 2024-03-05 NOTE — Anesthesia Postprocedure Evaluation (Signed)
 Anesthesia Post Note  Patient: QUASIM DOYON  Procedure(s) Performed: EGD (ESOPHAGOGASTRODUODENOSCOPY)  Patient location during evaluation: Endoscopy Anesthesia Type: General Level of consciousness: awake and alert Pain management: pain level controlled Vital Signs Assessment: post-procedure vital signs reviewed and stable Respiratory status: spontaneous breathing, nonlabored ventilation, respiratory function stable and patient connected to nasal cannula oxygen Cardiovascular status: blood pressure returned to baseline and stable Postop Assessment: no apparent nausea or vomiting Anesthetic complications: no   No notable events documented.   Last Vitals:  Vitals:   03/05/24 0910 03/05/24 0914  BP:  (!) 152/65  Pulse: 88 88  Resp: (!) 25 (!) 21  Temp:    SpO2: 100% 93%    Last Pain:  Vitals:   03/05/24 0901  TempSrc: Temporal  PainSc:                  Louie Boston

## 2024-03-05 NOTE — Evaluation (Signed)
 Occupational Therapy Evaluation Patient Details Name: Jesse Norton MRN: [id number] DOB: [birth year] Today's Date: 03/05/2024   History of Present Illness   Pt is a 78 y.o. male  who presents to the ED due to melena. Admitted for management of upper GI bleed, anemia, AKI. 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     Clinical Impressions Pt was seen for OT evaluation this date. PTA, pt was living at home with his wife in a 2 level home with 6 STE and bil HR. Reports bathroom/bedroom on 2nd level where he must ascend/descend 16 steps with bil HR. Pt was IND without AD use prior to 2 weeks ago, driving and community mobile.   Pt presents to acute OT demonstrating impaired ADL performance and functional mobility 2/2 weakness, pain, and low activity tolerance. Pt currently requires SUP for bed mobility and to don socks at bed level. CGA for STS from EOB without AD use and HHA with Min/CGA for a few steps to [facility] against the wall. BM and urinated noted with blood present-notified nurse. BP monitored with drop from 100/63 to 93/46. Mild lightheadedness noted. Total/Max A for LB dressing to doff/don boxer briefs and for peri-care in standing at [facility] for support. CGA using RW to take a few steps back to the bed. Retrieved fresh water for pt and secure chat sent to [facility] regarding DC needs.  Pt would benefit from skilled OT services to address noted impairments and functional limitations (see below for any additional details) in order to maximize safety and independence while minimizing falls risk and caregiver burden. Do anticipate the need for follow up OT services upon acute hospital DC.     If plan is discharge home, recommend the following:   A little help with walking and/or transfers;A lot of help with bathing/dressing/bathroom;Assistance with cooking/housework;Assist  for transportation;Help with stairs or ramp for entrance     Functional Status Assessment   Patient has had a recent decline in their functional status and demonstrates the ability to make significant improvements in function in a reasonable and predictable amount of time.     Equipment Recommendations   BSC/3in1     Recommendations for Other Services         Precautions/Restrictions   Precautions Precautions: Fall Restrictions Weight Bearing Restrictions Per Provider Order: No     Mobility Bed Mobility Overal bed mobility: Needs Assistance Bed Mobility: Sit to Supine, Supine to Sit     Supine to sit: Supervision Sit to supine: Supervision   General bed mobility comments: no physical assist needed-found in long sitting on entry    Transfers Overall transfer level: Needs assistance Equipment used: None Transfers: Sit to/from Stand, Bed to chair/wheelchair/BSC Sit to Stand: Contact guard assist     Step pivot transfers: Contact guard assist, Min assist     General transfer comment: CGA/SBA for STS from EOB and BSC; Min/CGA via HHA to take a few steps to [facility], utilized RW to return to bed after standing peri-care performed d/t fatigue      Balance Overall balance assessment: Needs assistance   Sitting balance-Leahy Scale: Good     Standing balance support: Reliant on assistive device for balance, Bilateral upper extremity supported Standing balance-Leahy Scale: Fair Standing balance comment: CGA and RW use  ADL either performed or assessed with clinical judgement   ADL Overall ADL's : Needs assistance/impaired                 Upper Body Dressing : Minimal assistance;Standing Upper Body Dressing Details (indicate cue type and reason): to manuever tele monitor Lower Body Dressing: Maximal assistance;Sit to/from stand;Sitting/lateral leans;Moderate assistance Lower Body Dressing Details (indicate cue type and  reason): to change boxer briefs; however able to don socks at bed level with set up assist Toilet Transfer: Minimal assistance;Contact guard assist;BSC/3in1;Rolling walker (2 wheels)   Toileting- Clothing Manipulation and Hygiene: Sit to/from stand;Maximal assistance               Vision         Perception         Praxis         Pertinent Vitals/Pain Pain Assessment Pain Assessment: Faces Faces Pain Scale: Hurts little more Pain Location: back Pain Descriptors / Indicators: Aching Pain Intervention(s): Monitored during session, Repositioned     Extremity/Trunk Assessment Upper Extremity Assessment Upper Extremity Assessment: Overall WFL for tasks assessed   Lower Extremity Assessment Lower Extremity Assessment: Generalized weakness       Communication Communication Communication: No apparent difficulties   Cognition Arousal: Alert Behavior During Therapy: WFL for tasks assessed/performed Cognition: No apparent impairments, Cognition impaired             OT - Cognition Comments: wife reports mild confusion d/t recent hospitalizations with only being home a few days in between, however he was able to state name, location, month and year accurately                 Following commands: Intact       Cueing  General Comments   Cueing Techniques: Verbal cues  BP 100/63 seated dropped to 93/46 with standing for peri-care; pt reports mild lightheadedness with standing   Exercises Other Exercises Other Exercises: Edu on role of OT in care setting.   Shoulder Instructions      Home Living Family/patient expects to be discharged to:: Private residence Living Arrangements: Spouse/significant other Available Help at Discharge: Family;Available 24 hours/day Type of Home: House Home Access: Stairs to enter [employer] of Steps: 6 Entrance Stairs-Rails: Left;Right (cannot reach both) Home Layout: Two level;Bed/bath upstairs Alternate  Level Stairs-Number of Steps: 16 steps with bil HR Alternate Level Stairs-Rails: Can reach both;Left;Right Bathroom Shower/Tub: Producer, television/film/video: Handicapped height Bathroom Accessibility: Yes   Home Equipment: Rollator (4 wheels);Shower seat;Grab bars - tub/shower          Prior Functioning/Environment Prior Level of Function : Independent/Modified Independent;Driving             Mobility Comments: IND without AD use prior to 2 weeks ago ADLs Comments: IND and driving 2 weeks ago    OT Problem List: Decreased strength;Decreased activity tolerance   OT Treatment/Interventions: Self-care/ADL training;Therapeutic exercise;Balance training;Therapeutic activities;DME and/or AE instruction;Energy conservation;Patient/family education      OT Goals(Current goals can be found in the care plan section)   Acute Rehab OT Goals Patient Stated Goal: return home OT Goal Formulation: With patient/family Time For Goal Achievement: 03/19/24 Potential to Achieve Goals: Good ADL Goals Pt Will Perform Lower Body Bathing: with supervision;sit to/from stand;sitting/lateral leans;with contact guard assist Pt Will Perform Lower Body Dressing: with supervision;with contact guard assist;sit to/from stand;sitting/lateral leans Pt Will Transfer to Toilet: with supervision;ambulating;regular height toilet Pt Will Perform Toileting - Clothing Manipulation and  hygiene: with supervision;sitting/lateral leans;sit to/from stand   OT Frequency:  Min 2X/week    Co-evaluation              AM-PAC OT "6 Clicks" Daily Activity     Outcome Measure Help from another person eating meals?: None Help from another person taking care of personal grooming?: None Help from another person toileting, which includes using toliet, bedpan, or urinal?: A Lot Help from another person bathing (including washing, rinsing, drying)?: A Lot Help from another person to put on and taking off regular  upper body clothing?: None Help from another person to put on and taking off regular lower body clothing?: A Lot 6 Click Score: 18   End of Session Equipment Utilized During Treatment: Rolling walker (2 wheels) Nurse Communication: Mobility status  Activity Tolerance: Patient tolerated treatment well Patient left: in bed;with call bell/phone within reach;with bed alarm set;with family/visitor present  OT Visit Diagnosis: Other abnormalities of gait and mobility (R26.89);Unsteadiness on feet (R26.81)                Time: 1610-9604 OT Time Calculation (min): 53 min Charges:  OT General Charges $OT Visit: 1 Visit OT Evaluation $OT Eval Moderate Complexity: 1 Mod OT Treatments $Self Care/Home Management : 38-52 mins Sharne Linders, OTR/L 03/05/24, 3:28 PM  Dorris Pierre E Luby Seamans 03/05/2024, 3:24 PM

## 2024-03-06 ENCOUNTER — Other Ambulatory Visit: Payer: Self-pay | Admitting: Internal Medicine

## 2024-03-06 ENCOUNTER — Encounter: Payer: Self-pay | Admitting: Gastroenterology

## 2024-03-06 ENCOUNTER — Ambulatory Visit: Payer: Medicare Other | Admitting: Nurse Practitioner

## 2024-03-06 ENCOUNTER — Inpatient Hospital Stay

## 2024-03-06 DIAGNOSIS — K269 Duodenal ulcer, unspecified as acute or chronic, without hemorrhage or perforation: Secondary | ICD-10-CM | POA: Diagnosis not present

## 2024-03-06 DIAGNOSIS — B3781 Candidal esophagitis: Secondary | ICD-10-CM

## 2024-03-06 DIAGNOSIS — K922 Gastrointestinal hemorrhage, unspecified: Secondary | ICD-10-CM | POA: Diagnosis not present

## 2024-03-06 LAB — BASIC METABOLIC PANEL WITH GFR
Anion gap: 10 (ref 5–15)
BUN: 29 mg/dL — ABNORMAL HIGH (ref 8–23)
CO2: 25 mmol/L (ref 22–32)
Calcium: 8.7 mg/dL — ABNORMAL LOW (ref 8.9–10.3)
Chloride: 96 mmol/L — ABNORMAL LOW (ref 98–111)
Creatinine, Ser: 1.1 mg/dL (ref 0.61–1.24)
GFR, Estimated: >60 mL/min (ref >60)
Glucose, Bld: 84 mg/dL (ref 70–99)
Potassium: 4.3 mmol/L (ref 3.5–5.1)
Sodium: 131 mmol/L — ABNORMAL LOW (ref 135–145)

## 2024-03-06 LAB — CBC
HCT: 25.1 % — ABNORMAL LOW (ref 39.0–52.0)
Hemoglobin: 8.8 g/dL — ABNORMAL LOW (ref 13.0–17.0)
MCH: 35.1 pg — ABNORMAL HIGH (ref 26.0–34.0)
MCHC: 35.1 g/dL (ref 30.0–36.0)
MCV: 100 fL (ref 80.0–100.0)
Platelets: 370 10*3/uL (ref 150–400)
RBC: 2.51 MIL/uL — ABNORMAL LOW (ref 4.22–5.81)
RDW: 21.7 % — ABNORMAL HIGH (ref 11.5–15.5)
WBC: 6.1 10*3/uL (ref 4.0–10.5)
nRBC: 28.5 % — ABNORMAL HIGH (ref 0.0–0.2)

## 2024-03-06 LAB — HAPTOGLOBIN: Haptoglobin: 234 mg/dL (ref 34–355)

## 2024-03-06 MED ORDER — ACETAMINOPHEN 325 MG PO TABS
650.0000 mg | ORAL_TABLET | Freq: Once | ORAL | Status: AC
  Administered 2024-03-07: 650 mg via ORAL
  Filled 2024-03-06: qty 2

## 2024-03-06 MED ORDER — DOXORUBICIN HCL CHEMO IV INJECTION 2 MG/ML
50.0000 mg/m2 | Freq: Once | INTRAVENOUS | Status: AC
  Administered 2024-03-07: 114 mg via INTRAVENOUS
  Filled 2024-03-06: qty 57

## 2024-03-06 MED ORDER — SODIUM CHLORIDE 0.9 % IV SOLN
375.0000 mg/m2 | Freq: Once | INTRAVENOUS | Status: AC
  Administered 2024-03-07: 900 mg via INTRAVENOUS
  Filled 2024-03-06: qty 90

## 2024-03-06 MED ORDER — FUROSEMIDE 40 MG PO TABS
40.0000 mg | ORAL_TABLET | Freq: Every day | ORAL | Status: DC
  Administered 2024-03-06 – 2024-03-10 (×5): 40 mg via ORAL
  Filled 2024-03-06 (×5): qty 1

## 2024-03-06 MED ORDER — CYCLOPHOSPHAMIDE CHEMO INJECTION 1 GM
750.0000 mg/m2 | Freq: Once | INTRAMUSCULAR | Status: AC
  Administered 2024-03-07: 1720 mg via INTRAVENOUS
  Filled 2024-03-06: qty 86

## 2024-03-06 MED ORDER — PREDNISONE 20 MG PO TABS
50.0000 mg | ORAL_TABLET | Freq: Every day | ORAL | Status: DC
  Administered 2024-03-06 – 2024-03-13 (×8): 50 mg via ORAL
  Filled 2024-03-06 (×8): qty 1

## 2024-03-06 MED ORDER — DIPHENHYDRAMINE HCL 25 MG PO CAPS
50.0000 mg | ORAL_CAPSULE | Freq: Once | ORAL | Status: AC
Start: 2024-03-07 — End: 2024-03-07
  Administered 2024-03-07: 50 mg via ORAL
  Filled 2024-03-06: qty 2

## 2024-03-06 MED ORDER — VINCRISTINE SULFATE CHEMO INJECTION 1 MG/ML
2.0000 mg | Freq: Once | INTRAVENOUS | Status: AC
  Administered 2024-03-07: 2 mg via INTRAVENOUS
  Filled 2024-03-06: qty 2

## 2024-03-06 MED ORDER — PALONOSETRON HCL INJECTION 0.25 MG/5ML
0.2500 mg | Freq: Once | INTRAVENOUS | Status: AC
  Administered 2024-03-07: 0.25 mg via INTRAVENOUS
  Filled 2024-03-06 (×2): qty 5

## 2024-03-06 MED ORDER — FOSAPREPITANT DIMEGLUMINE INJECTION 150 MG
150.0000 mg | Freq: Once | INTRAVENOUS | Status: AC
  Administered 2024-03-07: 150 mg via INTRAVENOUS
  Filled 2024-03-06: qty 5

## 2024-03-06 MED ORDER — FLUCONAZOLE IN SODIUM CHLORIDE 200-0.9 MG/100ML-% IV SOLN
200.0000 mg | Freq: Once | INTRAVENOUS | Status: AC
  Administered 2024-03-06: 200 mg via INTRAVENOUS
  Filled 2024-03-06: qty 100

## 2024-03-06 NOTE — Progress Notes (Signed)
 Jesse Minium, MD [facility]   [address] [place], [place] [address] Phone: [phone] Fax : [phone]   Subjective: The patient had an upper endoscopy with candidal esophagitis and duodenal ulcers with some gastritis yesterday.  The patient had some swelling of his arms and has gone down to ultrasound.  I spoke to the patient's wife who states that he has no other GI issues at the present time except some burping when he was at home.   Objective: Vital signs in last 24 hours: Vitals:   03/06/24 0418 03/06/24 0826 03/06/24 0840 03/06/24 0911  BP: 112/63  (!) 107/52   Pulse: 68  78   Resp: 16 18 18 18   Temp: 98 F (36.7 C)  (!) 97.5 F (36.4 C)   TempSrc:      SpO2: 95%  97%   Weight:      Height:       Weight change:   Intake/Output Summary (Last 24 hours) at 03/06/2024 1104 Last data filed at 03/06/2024 0300 Gross per 24 hour  Intake 480 ml  Output 402 ml  Net 78 ml      Lab Results: @LABTEST2 @ Micro Results: Recent Results (from the past 240 hours)  Resp panel by RT-PCR (RSV, Flu A&B, Covid) Anterior Nasal Swab     Status: None   Collection Time: 02/26/24  3:13 PM   Specimen: Anterior Nasal Swab  Result Value Ref Range Status   SARS Coronavirus 2 by RT PCR NEGATIVE NEGATIVE Final    Comment: (NOTE) SARS-CoV-2 target nucleic acids are NOT DETECTED.  The SARS-CoV-2 RNA is generally detectable in upper respiratory specimens during the acute phase of infection. The lowest concentration of SARS-CoV-2 viral copies this assay can detect is 138 copies/mL. A negative result does not preclude SARS-Cov-2 infection and should not be used as the sole basis for treatment or other patient management decisions. A negative result may occur with  improper specimen collection/handling, submission of specimen other than nasopharyngeal swab, presence of viral mutation(s) within the areas targeted by this assay, and inadequate number of viral copies(<138 copies/mL).  A negative result must be combined with clinical observations, patient history, and epidemiological information. The expected result is Negative.  Fact Sheet for Patients:  BloggerCourse.com  Fact Sheet for Healthcare Providers:  SeriousBroker.it  This test is no t yet approved or cleared by the [place] FDA and  has been authorized for detection and/or diagnosis of SARS-CoV-2 by FDA under an Emergency Use Authorization (EUA). This EUA will remain  in effect (meaning this test can be used) for the duration of the COVID-19 declaration under Section 564(b)(1) of the Act, 21 U.S.C.section 360bbb-3(b)(1), unless the authorization is terminated  or revoked sooner.       Influenza A by PCR NEGATIVE NEGATIVE Final   Influenza B by PCR NEGATIVE NEGATIVE Final    Comment: (NOTE) The Xpert Xpress SARS-CoV-2/FLU/RSV plus assay is intended as an aid in the diagnosis of influenza from Nasopharyngeal swab specimens and should not be used as a sole basis for treatment. Nasal washings and aspirates are unacceptable for Xpert Xpress SARS-CoV-2/FLU/RSV testing.  Fact Sheet for Patients: BloggerCourse.com  Fact Sheet for Healthcare Providers: SeriousBroker.it  This test is not yet approved or cleared by the [place] FDA and has been authorized for detection and/or diagnosis of SARS-CoV-2 by FDA under an Emergency Use Authorization (EUA). This EUA will remain in effect (meaning this test can be used) for the duration of  the COVID-19 declaration under Section 564(b)(1) of the Act, 21 U.S.C. section 360bbb-3(b)(1), unless the authorization is terminated or revoked.     Resp Syncytial Virus by PCR NEGATIVE NEGATIVE Final    Comment: (NOTE) Fact Sheet for Patients: BloggerCourse.com  Fact Sheet for Healthcare  Providers: SeriousBroker.it  This test is not yet approved or cleared by the [place] FDA and has been authorized for detection and/or diagnosis of SARS-CoV-2 by FDA under an Emergency Use Authorization (EUA). This EUA will remain in effect (meaning this test can be used) for the duration of the COVID-19 declaration under Section 564(b)(1) of the Act, 21 U.S.C. section 360bbb-3(b)(1), unless the authorization is terminated or revoked.  Performed at [facility], [address] [place], [place] [address]   MRSA Next Gen by PCR, Nasal     Status: None   Collection Time: 02/28/24 11:10 AM   Specimen: Nasal Mucosa; Nasal Swab  Result Value Ref Range Status   MRSA by PCR Next Gen NOT DETECTED NOT DETECTED Final    Comment: (NOTE) The GeneXpert MRSA Assay (FDA approved for NASAL specimens only), is one component of a comprehensive MRSA colonization surveillance program. It is not intended to diagnose MRSA infection nor to guide or monitor treatment for MRSA infections. Test performance is not FDA approved in patients less than 60 years old. Performed at [facility], [address] [place], [place] [address]   KOH prep     Status: None   Collection Time: 03/05/24  9:00 AM   Specimen: Esophagus  Result Value Ref Range Status   Specimen Description ESOPHAGUS  Final   Special Requests brushing  Final   KOH Prep   Final    YEAST PRESENT Performed at [facility], [address] [place], [place] [address]    Report Status 03/05/2024 FINAL  Final   Studies/Results: US Venous Img Lower Unilateral Right (DVT) Result Date: 03/05/2024 CLINICAL DATA:  Right leg swelling. EXAM: RIGHT LOWER EXTREMITY VENOUS DOPPLER ULTRASOUND TECHNIQUE: Gray-scale sonography with graded compression, as well as color Doppler and duplex ultrasound were performed to evaluate the lower extremity deep venous systems from the level of the common femoral  vein and including the common femoral, femoral, profunda femoral, popliteal and calf veins including the posterior tibial, peroneal and gastrocnemius veins when visible. The superficial great saphenous vein was also interrogated. Spectral Doppler was utilized to evaluate flow at rest and with distal augmentation maneuvers in the common femoral, femoral and popliteal veins. COMPARISON:  None Available. FINDINGS: Contralateral Common Femoral Vein: Respiratory phasicity is normal and symmetric with the symptomatic side. No evidence of thrombus. Normal compressibility. Common Femoral Vein: No evidence of thrombus. Normal compressibility, respiratory phasicity and response to augmentation. Saphenofemoral Junction: No evidence of thrombus. Normal compressibility and flow on color Doppler imaging. Profunda Femoral Vein: No evidence of thrombus. Normal compressibility and flow on color Doppler imaging. Femoral Vein: No evidence of thrombus. Normal compressibility, respiratory phasicity and response to augmentation. Popliteal Vein: No evidence of thrombus. Normal compressibility, respiratory phasicity and response to augmentation. Calf Veins: Visualized right deep calf veins are patent without thrombus. Other Findings:  None. IMPRESSION: Negative for deep venous thrombosis in right lower extremity. Electronically Signed   By: Richarda Overlie M.D.   On: 03/05/2024 15:29   Medications: I have reviewed the patient's current medications. Scheduled Meds:  acyclovir  400 mg Oral BID   allopurinol  300 mg Oral BID   amiodarone  200 mg Oral  Daily   atorvastatin  40 mg Oral Daily   metoprolol tartrate  50 mg Oral BID   pantoprazole  40 mg Oral BID AC   predniSONE  50 mg Oral Q breakfast   sodium chloride flush  3 mL Intravenous Q12H   Continuous Infusions:  sodium chloride     PRN Meds:.acetaminophen **OR** acetaminophen, HYDROcodone-acetaminophen, ondansetron **OR** ondansetron (ZOFRAN) IV   Assessment: Principal  Problem:   Upper GI bleed Active Problems:   Esophageal lesion   Coronary artery disease of native artery of native heart with stable angina pectoris (HCC)   Chronic heart failure with preserved ejection fraction (HCC)   DLBCL (diffuse large B cell lymphoma) (HCC)   Paroxysmal atrial fibrillation (HCC)   Symptomatic anemia   Leg edema, right   AKI (acute kidney injury) (HCC)   Melena   Postpyloric ulcer    Plan: The patient had clean-based ulcers without any active bleeding with a stable hemoglobin.  The patient is being treated for his candidal esophagitis and is on a PPI.  No further sign of bleeding.  Nothing further to do from a GI point of view.  I will sign off.  Please call if any further GI concerns or questions.  We would like to thank you for the opportunity to participate in the care of Jesse Norton.    LOS: 2 days   Jesse Minium, MD.FACG 03/06/2024, 11:04 AM Pager [phone] 7am-5pm  Check AMION for 5pm -7am coverage and on weekends

## 2024-03-06 NOTE — Progress Notes (Signed)
 PT Cancellation Note  Patient Details Name: Jesse Norton MRN: [id number] DOB: September 11, 1946   Cancelled Treatment:     PT attempt. Pt is currently off floor in Ultrasound. Family member in room requested Thereasa Parkin return this afternoon.    Rushie Chestnut 03/06/2024, 10:34 AM

## 2024-03-06 NOTE — Progress Notes (Signed)
 PT Cancellation Note  Patient Details Name: Jesse Norton MRN: [id number] DOB: Mar 14, 1946   Cancelled Treatment:     PT attempt, 2nd attempt. Pt currently seated EOB eating lunch (cheeseburger) with OT and spouse at bedside. Acute PT will continue to follow and progress per current POC. Will return at a later time/date.    Rushie Chestnut 03/06/2024, 1:46 PM

## 2024-03-06 NOTE — Progress Notes (Signed)
 Occupational Therapy Treatment Patient Details Name: Jesse Norton MRN: [id number] DOB: 07-Jan-1946 Today's Date: 03/06/2024   History of present illness Pt is a 78 y.o. male  who presents to the ED due to melena. Admitted for management of upper GI bleed, anemia, AKI. 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   OT comments  Pt demonstrates good progress today: more stable in standing, able to ambulate to bathroom rather than BSC, improved sitting balance, increased endurance, able to ambulate while carrying on conversation, improved mood. Pt required supervision for safety but needed physical assistance only for LB dressing. Provided educ to pt and spouse re: home safety, potential home modifications, importance of gradually but steadily increasing physical activity, falls prevention.       If plan is discharge home, recommend the following:  A little help with walking and/or transfers;A lot of help with bathing/dressing/bathroom;Assistance with cooking/housework;Assist for transportation;Help with stairs or ramp for entrance   Equipment Recommendations       Recommendations for Other Services      Precautions / Restrictions Precautions Precautions: Fall       Mobility Bed Mobility                    Transfers Overall transfer level: Needs assistance Equipment used: Rolling walker (2 wheels) Transfers: Sit to/from Stand Sit to Stand: Supervision           General transfer comment: Able to come into standing fairly fluidly. Required verbal cueing for safe use of RW     Balance Overall balance assessment: Needs assistance Sitting-balance support: Single extremity supported, No upper extremity supported Sitting balance-Leahy Scale: Good     Standing balance support: Bilateral upper extremity supported Standing balance-Leahy Scale:  Good Standing balance comment: endorses mild lightheadedness in standing, but displays no unsteadiness                           ADL either performed or assessed with clinical judgement   ADL Overall ADL's : Needs assistance/impaired Eating/Feeding: Modified independent;Set up   Grooming: Wash/dry hands;Wash/dry face;Modified independent;Set up                   Toilet Transfer: Contact guard Actor;Ambulation;Rolling walker (2 wheels)   Toileting- Clothing Manipulation and Hygiene: Sit to/from stand;Minimal assistance Toileting - Clothing Manipulation Details (indicate cue type and reason): Min A for pulling up underwear            Extremity/Trunk Assessment Upper Extremity Assessment Upper Extremity Assessment: Generalized weakness   Lower Extremity Assessment Lower Extremity Assessment: Generalized weakness        Vision       Perception     Praxis     Communication     Cognition Arousal: Alert Behavior During Therapy: WFL for tasks assessed/performed Cognition: No apparent impairments                               Following commands: Intact        Cueing      Exercises      Shoulder Instructions       General Comments      Pertinent Vitals/ Pain       Pain Assessment Pain Assessment: 0-10 Pain Score: 3  Pain  Location: back Pain Descriptors / Indicators: Aching Pain Intervention(s): Monitored during session, Repositioned  Home Living                                          Prior Functioning/Environment              Frequency  Min 2X/week        Progress Toward Goals  OT Goals(current goals can now be found in the care plan section)  Progress towards OT goals: Progressing toward goals     Plan      Co-evaluation                 AM-PAC OT "6 Clicks" Daily Activity     Outcome Measure   Help from another person eating meals?: None Help from another  person taking care of personal grooming?: None Help from another person toileting, which includes using toliet, bedpan, or urinal?: A Little Help from another person bathing (including washing, rinsing, drying)?: A Lot Help from another person to put on and taking off regular upper body clothing?: None Help from another person to put on and taking off regular lower body clothing?: A Lot 6 Click Score: 19    End of Session Equipment Utilized During Treatment: Rolling walker (2 wheels)  OT Visit Diagnosis: Other abnormalities of gait and mobility (R26.89);Unsteadiness on feet (R26.81)   Activity Tolerance Patient tolerated treatment well   Patient Left in bed;with family/visitor present;with call bell/phone within reach   Nurse Communication          Time: 1335-1401 OT Time Calculation (min): 26 min  Charges: OT General Charges $OT Visit: 1 Visit OT Treatments $Self Care/Home Management : 23-37 mins  Latina Craver, PhD, MS, OTR/L 03/06/24, 2:37 PM

## 2024-03-06 NOTE — Progress Notes (Signed)
 Pharmacy Antimicrobial Note  Jesse Norton is a 78 y.o. male admitted on 03/04/2024 with  suspected esophageal candidiasis . Patient presenting with upper GI bleed. EGD on 3/26 showed esophageal plaques concerning for candidiasis. Patient is afebrile with no leukocytosis. Pharmacy has been consulted for fluconazole dosing.  Plan: Give fluconazole 200 mg IV x1 today Start fluconazole 100 mg PO daily on 03/07/24 Monitor renal function, clinical status, culture data, and LOT F/u cx/pathology from EGD  Height: 5\' 10"  (177.8 cm) Weight: 106.6 kg (235 lb) IBW/kg (Calculated) : 73  Temp (24hrs), Avg:97.5 F (36.4 C), Min:95.8 F (35.4 C), Max:98 F (36.7 C)  Recent Labs  Lab 02/29/24 0512 03/04/24 1230 03/04/24 1529 03/05/24 0446 03/05/24 0740 03/06/24 0434  WBC 7.2 8.3 8.6  --  7.3 6.1  CREATININE 0.89 1.44*  --  1.27*  --  1.10    Estimated Creatinine Clearance: 66.5 mL/min (by C-G formula based on SCr of 1.1 mg/dL).    No Known Allergies  Antimicrobials this admission: acyclovir 3/27 >>  fluconazole 3/27 >>  Dose adjustments this admission: N/A  Microbiology results: 3/26 EGD cx: pending  Thank you for involving pharmacy in this patient's care.   Rockwell Alexandria, PharmD Clinical Pharmacist 03/06/2024 7:38 AM

## 2024-03-06 NOTE — Progress Notes (Signed)
 Jesse Norton   DOB:October 26, 1946   WU#:[id number]    Subjective: No acute events overnight.  Patient resting comfortably in the bed.  Patient states he feels down given the hectic past few days.  Otherwise denies any blood or black loose stool.  No nausea or vomiting.  Continues to have swelling of the legs and in the arm.  More so on the right arm today.  Objective:  Vitals:   03/06/24 0418 03/06/24 0840  BP: 112/63 (!) 107/52  Pulse: 68 78  Resp: 16 18  Temp: 98 F (36.7 C) (!) 97.5 F (36.4 C)  SpO2: 95% 97%     Intake/Output Summary (Last 24 hours) at 03/06/2024 0911 Last data filed at 03/06/2024 0300 Gross per 24 hour  Intake 480 ml  Output 402 ml  Net 78 ml   Bilateral upper lower extremity swelling.  Generalized neck adenopathy and active adenopathy. Physical Exam Vitals and nursing note reviewed.  HENT:     Head: Normocephalic and atraumatic.     Mouth/Throat:     Pharynx: Oropharynx is clear.  Eyes:     Extraocular Movements: Extraocular movements intact.     Pupils: Pupils are equal, round, and reactive to light.  Cardiovascular:     Rate and Rhythm: Normal rate and regular rhythm.  Pulmonary:     Comments: Decreased breath sounds bilaterally.  Abdominal:     Palpations: Abdomen is soft.  Musculoskeletal:        General: Normal range of motion.     Cervical back: Normal range of motion.     Right lower leg: Edema present.     Left lower leg: Edema present.  Skin:    General: Skin is warm.  Neurological:     General: No focal deficit present.     Mental Status: He is alert and oriented to person, place, and time.  Psychiatric:        Behavior: Behavior normal.        Judgment: Judgment normal.      Labs:  Lab Results  Component Value Date   WBC 6.1 03/06/2024   HGB 8.8 (L) 03/06/2024   HCT 25.1 (L) 03/06/2024   MCV 100.0 03/06/2024   PLT 370 03/06/2024   NEUTROABS 6.9 03/04/2024    Lab Results  Component Value Date   NA 131 (L) 03/06/2024   K  4.3 03/06/2024   CL 96 (L) 03/06/2024   CO2 25 03/06/2024    Studies:  US Venous Img Lower Unilateral Right (DVT) Result Date: 03/05/2024 CLINICAL DATA:  Right leg swelling. EXAM: RIGHT LOWER EXTREMITY VENOUS DOPPLER ULTRASOUND TECHNIQUE: Gray-scale sonography with graded compression, as well as color Doppler and duplex ultrasound were performed to evaluate the lower extremity deep venous systems from the level of the common femoral vein and including the common femoral, femoral, profunda femoral, popliteal and calf veins including the posterior tibial, peroneal and gastrocnemius veins when visible. The superficial great saphenous vein was also interrogated. Spectral Doppler was utilized to evaluate flow at rest and with distal augmentation maneuvers in the common femoral, femoral and popliteal veins. COMPARISON:  None Available. FINDINGS: Contralateral Common Femoral Vein: Respiratory phasicity is normal and symmetric with the symptomatic side. No evidence of thrombus. Normal compressibility. Common Femoral Vein: No evidence of thrombus. Normal compressibility, respiratory phasicity and response to augmentation. Saphenofemoral Junction: No evidence of thrombus. Normal compressibility and flow on color Doppler imaging. Profunda Femoral Vein: No evidence of thrombus. Normal compressibility and flow on  color Doppler imaging. Femoral Vein: No evidence of thrombus. Normal compressibility, respiratory phasicity and response to augmentation. Popliteal Vein: No evidence of thrombus. Normal compressibility, respiratory phasicity and response to augmentation. Calf Veins: Visualized right deep calf veins are patent without thrombus. Other Findings:  None. IMPRESSION: Negative for deep venous thrombosis in right lower extremity. Electronically Signed   By: Richarda Overlie M.D.   On: 03/05/2024 56:35    78 year old male patient history of atrial fibrillation on Eliquis, diffuse large B-cell lymphoma , HFpEF, CAD s/p PCI  with DES to left circumflex and LAD,COPD, who presents to the ED due to melena.   # Diffuse large B-cell lymphoma-EBV associated-non-GCB subtype currently status post 1 treatment of rituximab approximately 2 weeks ago.  Clinically noted to have progression of disease based on lymphadenopathy and also rising LDH. Plan to start R-CHOP chemo on 3/28. Will monitor closely for tumor lysis.  Will check uric acid mag and also phosphorus.  G6PD workup pending.  Patient will be started on prednisone 50 mg a day x 10 days   # Severe anemia hemoglobin 6.5-on admission suspect secondary to GI bleed/and also underlying lymphoma.  Rule out hemolysis- haptoglobin-P.  S/p EGD- on PPI.   # Bilateral extremity upper extremity edema-likely third spacing/lymphatic blockage on the left side-recommend bilateral upper extremity Doppler.     # A-fib-continue metoprolol/and amiodarone.  Currently-Eliquis on hold.   # Prophylaxis: TLS- on allopurinol; ID- on Acyclovir.   # Discussed with pharmacy and also discussed with Dr. Dareen Piano.  If patient is discharged from the hospital over the weekend-appointments scheduled for close follow-up next week.    Earna Coder, MD 03/06/2024  9:11 AM

## 2024-03-06 NOTE — Progress Notes (Addendum)
 PROGRESS NOTE Jesse Norton    DOB: [birth year], 78 y.o.  ZOX:[id number]    Code Status: Full Code   DOA: 03/04/2024   LOS: 2   Brief hospital course  Jesse Norton is a 78 y.o. 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 Of note, patient was recently admitted for atrial fibrillation with RVR, new diagnosis and started on amiodarone, metoprolol, and Eliquis.  During this admission, he was also treated for CAP with azithromycin and ceftriaxone.   ED course: 112/59 with heart rate of 71.  He was saturating at 93% on room air.  He was afebrile at 97.8.  Initial workup notable for hemoglobin of 6.5, MCV of 110, creatinine 1.44, sodium 129, AST 50, GFR 49.  INR 1.6.  2 units of packed RBCs ordered and patient started on Protonix.   03/06/24 -s/p EGD yesterday showing candidal esophagitis. Started on diflucan. Pending chemo tomorrow.   Assessment & Plan  Principal Problem:   Upper GI bleed Active Problems:   Symptomatic anemia   AKI (acute kidney injury) (HCC)   Leg edema, right   Paroxysmal atrial fibrillation (HCC)   Chronic heart failure with preserved ejection fraction (HCC)   DLBCL (diffuse large B cell lymphoma) (HCC)   Coronary artery disease of native artery of native heart with stable angina pectoris (HCC)   Esophageal lesion   Melena   Postpyloric ulcer  Upper GI bleed- relating to recent eliquis initiation for afib Symptomatic anemia- symptoms improving. Partial bleed and lymphoma contributing. Hgb 6.5>>8.6>8.8 s/p transfusions prior history of gastric ulcer and Barrett's esophagus.   White plaque on lower esophagus, gastric antrum erosions, duodenal ulcers. None actively bleeding. No biopsies were collected. Found on EGD 3/26. - f/u testing for H.  Pylori - continue hold eliquis - s/p 2 units pRBCs - heme/onc following -CBC am  Candidal esophagitis- brushings obtained in EGD positive for yeast.  - diflucan started - GI consulted; appreciate their recommendations - soft diet -  Continue Protonix 40 mg IV twice daily - Hold home Eliquis  DLBCL (diffuse large B cell lymphoma) (HCC)-  - Oncology consulted; appreciate their recommendations. Planning chemo Friday. Likely dc on Saturday if clinically and labs stable - suspect lymphedema contributing to UE edema but obtaining doppler to R/o DVT.  - further management per onc orders    AKI- resolved. Normal baseline. 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  May ultimately require cardiology consultation if unable to resume anticoagulation.  Hyponatremia- mild.  - monitor periodically  Body mass index is 33.72 kg/m.  VTE ppx: SCDs Start: 03/04/24 1739  Diet:     Diet   DIET SOFT Room service appropriate? Yes; Fluid consistency: Thin   Consultants: GI Heme/onc  Subjective 03/06/24    Pt reports feeling fine today. Denies SOB,  CP. No bleeding apparent with Bms.    Objective   Vitals:   03/05/24 0931 03/05/24 1445 03/05/24 2131 03/06/24 0418  BP: (!) 155/68 (!) 100/54 115/65 112/63  Pulse: 86 68  68  Resp: (!) 21 18  16   Temp:  98 F (36.7 C) 97.8 F (36.6 C) 98 F (36.7 C)  TempSrc:   Oral   SpO2: 99% 98% 99% 95%  Weight:      Height:        Intake/Output  Summary (Last 24 hours) at 03/06/2024 0716 Last data filed at 03/06/2024 0300 Gross per 24 hour  Intake 480 ml  Output 402 ml  Net 78 ml   Filed Weights   03/04/24 1407  Weight: 106.6 kg    Physical Exam:  General: awake, alert, NAD HEENT: atraumatic, clear conjunctiva, anicteric sclera, MMM, hearing grossly normal Respiratory: normal respiratory effort. CTAB. Healing incision on R chest.  Cardiovascular: quick capillary refill, normal S1/S2, RRR, no JVD, murmurs Gastrointestinal: soft, NT, ND Nervous: A&O x3. no gross focal neurologic deficits, normal speech Extremities: moves all equally, non pitting edema, normal tone Skin: dry, intact, normal temperature, normal color. No rashes, lesions or ulcers on exposed skin Psychiatry: normal mood, congruent affect  Labs   I have personally reviewed the following labs and imaging studies CBC    Component Value Date/Time   WBC 6.1 03/06/2024 0434   RBC 2.51 (L) 03/06/2024 0434   HGB 8.8 (L) 03/06/2024 0434   HGB 6.5 (LL) 03/04/2024 1230   HGB 14.3 01/26/2023 0946   HCT 25.1 (L) 03/06/2024 0434   HCT 38.9 01/26/2023 0946   PLT 370 03/06/2024 0434   PLT 386 03/04/2024 1230   PLT 322 01/26/2023 0946   MCV 100.0 03/06/2024 0434   MCV 107 (H) 01/26/2023 0946   MCV 95 01/06/2014 1007   MCH 35.1 (H) 03/06/2024 0434   MCHC 35.1 03/06/2024 0434   RDW 21.7 (H) 03/06/2024 0434   RDW 12.0 01/26/2023 0946   RDW 13.6 01/06/2014 1007   LYMPHSABS 0.6 (L) 03/04/2024 1529   LYMPHSABS 2.4 01/26/2023 0946   MONOABS 0.7 03/04/2024 1529   EOSABS 0.1 03/04/2024 1529   EOSABS 0.5 (H) 01/26/2023 0946   BASOSABS 0.1 03/04/2024 1529   BASOSABS 0.2 01/26/2023 0946      Latest Ref Rng & Units 03/06/2024    4:34 AM 03/05/2024    4:46 AM 03/04/2024   12:30 PM  BMP  Glucose 70 - 99 mg/dL 84  99  161   BUN 8 - 23 mg/dL 29  33  33   Creatinine 0.61 - 1.24 mg/dL 0.96  0.45  4.09   Sodium 135 - 145 mmol/L 131  131  129   Potassium 3.5 - 5.1 mmol/L 4.3   4.2  4.6   Chloride 98 - 111 mmol/L 96  97  93   CO2 22 - 32 mmol/L 25  24  22    Calcium 8.9 - 10.3 mg/dL 8.7  9.0  8.8     US Venous Img Lower Unilateral Right (DVT) Result Date: 03/05/2024 CLINICAL DATA:  Right leg swelling. EXAM: RIGHT LOWER EXTREMITY VENOUS DOPPLER ULTRASOUND TECHNIQUE: Gray-scale sonography with graded compression, as well as color Doppler and duplex ultrasound were performed to evaluate the lower extremity deep venous systems from the level of the common femoral vein and including the common femoral, femoral, profunda femoral, popliteal and calf veins including the posterior tibial, peroneal and gastrocnemius veins when visible. The  superficial great saphenous vein was also interrogated. Spectral Doppler was utilized to evaluate flow at rest and with distal augmentation maneuvers in the common femoral, femoral and popliteal veins. COMPARISON:  None Available. FINDINGS: Contralateral Common Femoral Vein: Respiratory phasicity is normal and symmetric with the symptomatic side. No evidence of thrombus. Normal compressibility. Common Femoral Vein: No evidence of thrombus. Normal compressibility, respiratory phasicity and response to augmentation. Saphenofemoral Junction: No evidence of thrombus. Normal compressibility and flow on color Doppler imaging. Profunda Femoral Vein: No evidence of thrombus. Normal compressibility and flow on color Doppler imaging. Femoral Vein: No evidence of thrombus. Normal compressibility, respiratory phasicity and response to augmentation. Popliteal Vein: No evidence of thrombus. Normal compressibility, respiratory phasicity and response to augmentation. Calf Veins: Visualized right deep calf veins are patent without thrombus. Other Findings:  None. IMPRESSION: Negative for deep venous thrombosis in right lower extremity. Electronically Signed   By: Richarda Overlie M.D.   On: 03/05/2024 15:29    Disposition Plan & Communication  Patient status: Inpatient   Admitted From: Home Planned disposition location: Home Anticipated discharge date: 3/29 pending chemo treatment  Family Communication: wife at bedside    Author: Leeroy Bock, DO [facility] 03/06/2024, 7:16 AM   Available by Epic secure chat 7AM-7PM. If 7PM-7AM, please contact night-coverage.  [facility] contact information found on [email or web address].

## 2024-03-06 NOTE — Progress Notes (Signed)
 Per Dr. Donneta Romberg,  OK to use labs from 03/04/24 for in-patient chemotherapy C1D1 on 03/07/24   Sharen Hones, PharmD, BCPS Clinical Pharmacist

## 2024-03-06 NOTE — Progress Notes (Signed)
 OT Cancellation Note  Patient Details Name: [name] MRN: [id number] DOB: Jan 20, 1946   Cancelled Treatment:    Reason Eval/Treat Not Completed: Patient at procedure or test/ unavailable. Attempted to see pt for OT this AM, but he was off the floor for a procedure. Will attempt at a later time/date as pt is available.   Latina Craver 03/06/2024, 10:15 AM

## 2024-03-06 NOTE — Progress Notes (Signed)
 Pharmacist Chemotherapy Monitoring - Initial Assessment    Anticipated start date: 03/07/24   The following has been reviewed per standard work regarding the patient's treatment regimen: The patient's diagnosis, treatment plan and drug doses, and organ/hematologic function Lab orders and baseline tests specific to treatment regimen  The treatment plan start date, drug sequencing, and pre-medications Prior authorization status  Patient's documented medication list, including drug-drug interaction screen and prescriptions for anti-emetics and supportive care specific to the treatment regimen The drug concentrations, fluid compatibility, administration routes, and timing of the medications to be used The patient's access for treatment and lifetime cumulative dose history, if applicable  The patient's medication allergies and previous infusion related reactions, if applicable   Changes made to treatment plan:  Per inpatient policy, [facility]b will be reserved for cycle 2.  At request of Dr.Brahmanday, have added Vincristine 2 mg = R+CHOP for cycle 1 only.    Follow up needed:  F/u Tol of RTX for consideration of rapid, if appropriate.   Ebony Hail, Pharm.D., CPP 03/06/2024@9 :09 AM

## 2024-03-07 ENCOUNTER — Other Ambulatory Visit: Payer: Self-pay

## 2024-03-07 DIAGNOSIS — C8330 Diffuse large B-cell lymphoma, unspecified site: Secondary | ICD-10-CM

## 2024-03-07 DIAGNOSIS — K922 Gastrointestinal hemorrhage, unspecified: Secondary | ICD-10-CM | POA: Diagnosis not present

## 2024-03-07 LAB — CBC
HCT: 24.9 % — ABNORMAL LOW (ref 39.0–52.0)
Hemoglobin: 8.5 g/dL — ABNORMAL LOW (ref 13.0–17.0)
MCH: 34.4 pg — ABNORMAL HIGH (ref 26.0–34.0)
MCHC: 34.1 g/dL (ref 30.0–36.0)
MCV: 100.8 fL — ABNORMAL HIGH (ref 80.0–100.0)
Platelets: 349 10*3/uL (ref 150–400)
RBC: 2.47 MIL/uL — ABNORMAL LOW (ref 4.22–5.81)
RDW: 21.2 % — ABNORMAL HIGH (ref 11.5–15.5)
WBC: 6.7 10*3/uL (ref 4.0–10.5)
nRBC: 19.7 % — ABNORMAL HIGH (ref 0.0–0.2)

## 2024-03-07 LAB — H. PYLORI ANTIGEN, STOOL: H. Pylori Stool Ag, Eia: NEGATIVE

## 2024-03-07 LAB — GLUCOSE 6 PHOSPHATE DEHYDROGENASE
G6PDH: 13 U/g{Hb} (ref 4.8–15.7)
Hemoglobin: 9 g/dL — ABNORMAL LOW (ref 13.0–17.7)

## 2024-03-07 MED ORDER — SODIUM CHLORIDE 0.9 % IV SOLN: INTRAVENOUS | Status: DC

## 2024-03-07 MED ORDER — FILGRASTIM-AAFI 480 MCG/0.8ML IJ SOSY
480.0000 ug | PREFILLED_SYRINGE | Freq: Every day | INTRAMUSCULAR | Status: AC
  Administered 2024-03-08: 480 ug via SUBCUTANEOUS
  Filled 2024-03-07: qty 0.8

## 2024-03-07 MED ORDER — PROCHLORPERAZINE EDISYLATE 10 MG/2ML IJ SOLN
10.0000 mg | Freq: Four times a day (QID) | INTRAMUSCULAR | Status: DC | PRN
  Administered 2024-03-07: 10 mg via INTRAVENOUS
  Filled 2024-03-07: qty 2

## 2024-03-07 MED ORDER — FLUCONAZOLE 100 MG PO TABS
100.0000 mg | ORAL_TABLET | Freq: Every day | ORAL | Status: DC
  Administered 2024-03-07 – 2024-03-13 (×7): 100 mg via ORAL
  Filled 2024-03-07 (×8): qty 1

## 2024-03-07 NOTE — Progress Notes (Signed)
 Physical Therapy Treatment Patient Details Name: Jesse Norton MRN: [id number] DOB: [birth year] Today's Date: 03/07/2024   History of Present Illness Pt is a 78 y.o. male  who presents to the ED due to melena. Admitted for management of upper GI bleed, anemia, AKI. 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    PT Comments  Pt was long sitting in bed with supportive spouse and chemo/oncology RN present. Pt about to receive a treatment however agreeable to OOB before. He endorsed getting OOB early to BR and is currently fatigued from that. Discussed at length DC disposition. Both he/spouse want to DC directly home when able. Pt endorses having stairs to enter home but did not want to perform during this limited session. Author will return later to progress activity and attempt stairs.  He demonstrated safe abilities this session to exit bed, stand to RW, and ambulate short distance to recliner. Dc recs remain appropriate if able to tolerate and perform stairs to simulate home entry/exit. Acute PT will continue to follow per current POC.    If plan is discharge home, recommend the following: A little help with walking and/or transfers;A little help with bathing/dressing/bathroom;Help with stairs or ramp for entrance;Assist for transportation     Equipment Recommendations  Rolling walker (2 wheels);BSC/3in1       Precautions / Restrictions Precautions Precautions: Fall Recall of Precautions/Restrictions: Intact Restrictions Weight Bearing Restrictions Per Provider Order: No     Mobility  Bed Mobility Overal bed mobility: Needs Assistance Bed Mobility: Supine to Sit, Sit to Supine  Supine to sit: Supervision  General bed mobility comments: no physical assistance to exit R side of bed    Transfers Overall transfer level: Needs assistance Equipment used:  Rolling walker (2 wheels) Transfers: Sit to/from Stand Sit to Stand: Supervision  General transfer comment: pt was able to stand without physical assistance.    Ambulation/Gait Ambulation/Gait assistance: Supervision Gait Distance (Feet): 10 Feet Assistive device: Rolling walker (2 wheels) Gait Pattern/deviations: Step-through pattern Gait velocity: decreased  General Gait Details: Distance limited by pt about tostart chemo-treatment    Balance Overall balance assessment: Needs assistance Sitting-balance support: Bilateral upper extremity supported, Feet supported Sitting balance-Leahy Scale: Good     Standing balance support: Bilateral upper extremity supported Standing balance-Leahy Scale: Good       Communication Communication Communication: No apparent difficulties  Cognition Arousal: Alert Behavior During Therapy: WFL for tasks assessed/performed   PT - Cognitive impairments: No apparent impairments    PT - Cognition Comments: pleasant and agreeable to session Following commands: Intact                Pertinent Vitals/Pain Pain Assessment Pain Assessment: No/denies pain     PT Goals (current goals can now be found in the care plan section) Acute Rehab PT Goals Patient Stated Goal: to go home Progress towards PT goals: Progressing toward goals    Frequency    Min 2X/week       AM-PAC PT "6 Clicks" Mobility   Outcome Measure  Help needed turning from your back to your side while in a flat bed without using bedrails?: None Help needed moving from lying on your back to sitting on the side of a flat bed without using bedrails?: A Little Help needed moving to and from a bed to a chair (including a wheelchair)?: A Little Help needed standing  up from a chair using your arms (e.g., wheelchair or bedside chair)?: A Little Help needed to walk in hospital room?: A Little Help needed climbing 3-5 steps with a railing? : A Little 6 Click Score: 19    End  of Session   Activity Tolerance: Patient tolerated treatment well;Patient limited by fatigue Patient left: in chair;with call bell/phone within reach;with nursing/sitter in room;with family/visitor present Nurse Communication: Mobility status PT Visit Diagnosis: Muscle weakness (generalized) (M62.81);Difficulty in walking, not elsewhere classified (R26.2);Unsteadiness on feet (R26.81)     Time: 1610-9604 PT Time Calculation (min) (ACUTE ONLY): 12 min  Charges:    $Gait Training: 8-22 mins PT General Charges $$ ACUTE PT VISIT: 1 Visit                     Jetta Lout PTA 03/07/24, 12:00 PM

## 2024-03-07 NOTE — Progress Notes (Signed)
 PROGRESS NOTE Jesse Norton    DOB: May 16, 1946, 78 y.o.  JYN:[id number]    Code Status: Full Code   DOA: 03/04/2024   LOS: 3   Brief hospital course  Jesse Norton is a 78 y.o. 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 Of note, patient was recently admitted for atrial fibrillation with RVR, new diagnosis and started on amiodarone, metoprolol, and Eliquis.  During this admission, he was also treated for CAP with azithromycin and ceftriaxone.   ED course: 112/59 with heart rate of 71.  He was saturating at 93% on room air.  He was afebrile at 97.8.  Initial workup notable for hemoglobin of 6.5, MCV of 110, creatinine 1.44, sodium 129, AST 50, GFR 49.  INR 1.6.  2 units of packed RBCs ordered and patient started on Protonix.   3/26- EGD without active bleed, positive esophageal candidal infection.   03/07/24 -chemo treatment today. PT evaluation to help with dispo planning. Anemia is stable. No active bleeding.   Assessment & Plan  Principal Problem:   Upper GI bleed Active Problems:   Symptomatic anemia   AKI (acute kidney injury) (HCC)   Leg edema, right   Paroxysmal atrial fibrillation (HCC)   Chronic heart failure with preserved ejection fraction (HCC)   DLBCL (diffuse large B cell lymphoma) (HCC)   Coronary artery disease of native artery of native heart with stable angina pectoris (HCC)   Esophageal lesion   Melena   Postpyloric ulcer  Upper GI bleed- relating to recent eliquis initiation for afib Symptomatic anemia- symptoms improving. Partial bleed and lymphoma contributing. Hgb 6.5>>8.6>8.8>8.5 s/p transfusions prior history of gastric ulcer and Barrett's esophagus.   White plaque on lower esophagus, gastric antrum erosions, duodenal ulcers. None  actively bleeding. No biopsies were collected. Found on EGD 3/26. - f/u testing for H. Pylori - continue hold eliquis - s/p 2 units pRBCs - heme/onc following -CBC am  Candidal esophagitis- brushings obtained in EGD positive for yeast.  - diflucan started - GI consulted; appreciate their recommendations - soft diet -  Continue Protonix 40 mg IV twice daily - Hold home Eliquis  DLBCL (diffuse large B cell lymphoma) (HCC)-  - Oncology consulted; appreciate their recommendations.Chemo today. Likely dc on Saturday if clinically and labs stable - suspect lymphedema contributing to UE edema but obtaining doppler to R/o DVT. Doppler was negative for DVT. Positive for lymphadenopathy.  - arm compression sleeve and elevation - further management per onc orders  CAD of native artery of native heart with stable angina pectoris [facility]) Patient states his Plavix was discontinued when he was started on Eliquis.  May ultimately require cardiology consultation if unable to resume anticoagulation. DES in LAD 2020.  - restart aspirin or plavix tomorrow after discussing with patient    AKI- resolved. Normal baseline. Cr 1.44>1.27>1.10    Paroxysmal atrial fibrillation (HCC)-  Currently in sinus rhythm - Continue amiodarone and metoprolol - Hold home Eliquis. Have discussion about risks/benefits about restarting with shared-decision making with patient and wife   Chronic heart failure with preserved ejection fraction (HCC) Patient appears hypervolemic on examination - continue home diuretics now that AKI completed - Strict in and out - Daily weights   Hyponatremia- mild.  - monitor periodically  Body mass index is 35.24 kg/m.  VTE ppx: SCDs Start: 03/04/24 1739  Diet:     Diet   DIET SOFT Room service appropriate? Yes; Fluid consistency: Thin   Consultants: GI Heme/onc  Subjective 03/07/24    Pt reports feeling fine today. Denies SOB, CP. No bleeding apparent with Bms.     Objective   Vitals:   03/06/24 1900 03/06/24 1948 03/07/24 0452 03/07/24 0455  BP: 116/67 109/67 101/61   Pulse: 74 73 63   Resp: 17 16 16    Temp: 97.8 F (36.6 C) 97.7 F (36.5 C) (!) 96.6 F (35.9 C)   TempSrc:      SpO2: 100% 95% 97%   Weight:    111.4 kg  Height:       No intake or output data in the 24 hours ending 03/07/24 0722  Filed Weights   03/04/24 1407 03/07/24 0455  Weight: 106.6 kg 111.4 kg    Physical Exam:  General: awake, alert, NAD HEENT: atraumatic, clear conjunctiva, anicteric sclera, MMM, hearing grossly normal Respiratory: normal respiratory effort. CTAB. Healing incision on R chest.  Cardiovascular: quick capillary refill, normal S1/S2, RRR, no JVD, murmurs Gastrointestinal: soft, NT, ND Nervous: A&O x3. no gross focal neurologic deficits, normal speech Extremities: moves all equally, non pitting edema, normal tone Skin: dry, intact, normal temperature, normal color. No rashes, lesions or ulcers on exposed skin Psychiatry: normal mood, congruent affect  Labs   I have personally reviewed the following labs and imaging studies CBC    Component Value Date/Time   WBC 6.7 03/07/2024 0444   RBC 2.47 (L) 03/07/2024 0444   HGB 8.5 (L) 03/07/2024 0444   HGB 9.0 (L) 03/05/2024 0446   HCT 24.9 (L) 03/07/2024 0444   HCT 38.9 01/26/2023 0946   PLT 349 03/07/2024 0444   PLT 386 03/04/2024 1230   PLT 322 01/26/2023 0946   MCV 100.8 (H) 03/07/2024 0444   MCV 107 (H) 01/26/2023 0946   MCV 95 01/06/2014 1007   MCH 34.4 (H) 03/07/2024 0444   MCHC 34.1 03/07/2024 0444   RDW 21.2 (H) 03/07/2024 0444   RDW 12.0 01/26/2023 0946   RDW 13.6 01/06/2014 1007   LYMPHSABS 0.6 (L) 03/04/2024 1529   LYMPHSABS 2.4 01/26/2023 0946   MONOABS 0.7 03/04/2024 1529   EOSABS 0.1 03/04/2024 1529   EOSABS 0.5 (H) 01/26/2023 0946   BASOSABS 0.1 03/04/2024 1529   BASOSABS 0.2 01/26/2023 0946      Latest Ref Rng & Units 03/06/2024    4:34 AM 03/05/2024    4:46 AM  03/04/2024   12:30 PM  BMP  Glucose 70 - 99 mg/dL 84  99  161   BUN 8 - 23 mg/dL 29  33  33   Creatinine 0.61 - 1.24 mg/dL 0.96  0.45  4.09   Sodium 135 - 145 mmol/L 131  131  129   Potassium 3.5 - 5.1 mmol/L 4.3  4.2  4.6   Chloride 98 - 111 mmol/L 96  97  93   CO2 22 - 32 mmol/L 25  24  22    Calcium 8.9 - 10.3 mg/dL 8.7  9.0  8.8     US Venous Img Upper Bilat (DVT) Result Date: 03/06/2024 CLINICAL DATA:  Bilateral upper extremity swelling. EXAM: BILATERAL UPPER EXTREMITY VENOUS DOPPLER ULTRASOUND TECHNIQUE: Gray-scale sonography with graded compression, as well as color Doppler and duplex ultrasound were performed to evaluate the bilateral upper extremity deep venous systems from the level of the subclavian vein and including the  jugular, axillary, basilic, radial, ulnar and upper cephalic vein. Spectral Doppler was utilized to evaluate flow at rest and with distal augmentation maneuvers. COMPARISON:  February 27, 2024. FINDINGS: RIGHT UPPER EXTREMITY Internal Jugular Vein: No evidence of thrombus. Normal compressibility, respiratory phasicity and response to augmentation. Subclavian Vein: No evidence of thrombus. Normal compressibility, respiratory phasicity and response to augmentation. Axillary Vein: No evidence of thrombus. Normal compressibility, respiratory phasicity and response to augmentation. Cephalic Vein: No evidence of thrombus. Normal compressibility, respiratory phasicity and response to augmentation. Basilic Vein: No evidence of thrombus. Normal compressibility, respiratory phasicity and response to augmentation. Brachial Veins: No evidence of thrombus. Normal compressibility, respiratory phasicity and response to augmentation. Radial Veins: No evidence of thrombus. Normal compressibility, respiratory phasicity and response to augmentation. Ulnar Veins: No evidence of thrombus. Normal compressibility, respiratory phasicity and response to augmentation. Venous Reflux:  None. Multiple  enlarged lymph nodes are noted in right cervical region concerning for metastatic disease, the largest measuring 2.1 cm in minor axis. LEFT UPPER EXTREMITY Internal Jugular Vein: No evidence of thrombus. Normal compressibility, respiratory phasicity and response to augmentation. Subclavian Vein: No evidence of thrombus. Normal compressibility, respiratory phasicity and response to augmentation. Axillary Vein: No evidence of thrombus. Normal compressibility, respiratory phasicity and response to augmentation. Cephalic Vein: No evidence of thrombus. Normal compressibility, respiratory phasicity and response to augmentation. Basilic Vein: No evidence of thrombus. Normal compressibility, respiratory phasicity and response to augmentation. Brachial Veins: No evidence of thrombus. Normal compressibility, respiratory phasicity and response to augmentation. Radial Veins: No evidence of thrombus. Normal compressibility, respiratory phasicity and response to augmentation. Ulnar Veins: No evidence of thrombus. Normal compressibility, respiratory phasicity and response to augmentation. Venous Reflux:  None. Other Findings: Multiple enlarged left cervical lymph nodes are noted consistent with metastatic disease, the largest measuring 2.8 cm in minor axis. IMPRESSION: No evidence of DVT within either upper extremity. Bilateral cervical adenopathy is noted consistent with metastatic disease. Electronically Signed   By: Lupita Raider M.D.   On: 03/06/2024 14:43   US Venous Img Lower Unilateral Right (DVT) Result Date: 03/05/2024 CLINICAL DATA:  Right leg swelling. EXAM: RIGHT LOWER EXTREMITY VENOUS DOPPLER ULTRASOUND TECHNIQUE: Gray-scale sonography with graded compression, as well as color Doppler and duplex ultrasound were performed to evaluate the lower extremity deep venous systems from the level of the common femoral vein and including the common femoral, femoral, profunda femoral, popliteal and calf veins including the  posterior tibial, peroneal and gastrocnemius veins when visible. The superficial great saphenous vein was also interrogated. Spectral Doppler was utilized to evaluate flow at rest and with distal augmentation maneuvers in the common femoral, femoral and popliteal veins. COMPARISON:  None Available. FINDINGS: Contralateral Common Femoral Vein: Respiratory phasicity is normal and symmetric with the symptomatic side. No evidence of thrombus. Normal compressibility. Common Femoral Vein: No evidence of thrombus. Normal compressibility, respiratory phasicity and response to augmentation. Saphenofemoral Junction: No evidence of thrombus. Normal compressibility and flow on color Doppler imaging. Profunda Femoral Vein: No evidence of thrombus. Normal compressibility and flow on color Doppler imaging. Femoral Vein: No evidence of thrombus. Normal compressibility, respiratory phasicity and response to augmentation. Popliteal Vein: No evidence of thrombus. Normal compressibility, respiratory phasicity and response to augmentation. Calf Veins: Visualized right deep calf veins are patent without thrombus. Other Findings:  None. IMPRESSION: Negative for deep venous thrombosis in right lower extremity. Electronically Signed   By: Richarda Overlie M.D.   On: 03/05/2024 15:29    Disposition Plan & Communication  Patient status: Inpatient  Admitted From: Home Planned disposition location: Home Anticipated discharge date: 3/29 pending chemo treatment  Family Communication: none at bedside    Author: Leeroy Bock, DO [facility] 03/07/2024, 7:22 AM   Available by Epic secure chat 7AM-7PM. If 7PM-7AM, please contact night-coverage.  [facility] contact information found on [email or web address].

## 2024-03-07 NOTE — TOC Initial Note (Signed)
 Transition of Care [facility]) - Initial/Assessment Note    Patient Details  Name: Jesse Norton MRN: [id number] Date of Birth: 1946/03/28  Transition of Care [facility]) CM/SW Contact:    Chapman Fitch, RN Phone Number: 03/07/2024, 10:34 AM  Clinical Narrative:                 Met with patient and wife Marylu Lund at bedside  Admitted for: Gi bleed Admitted from: home with wife [id number]   Therapy recommending home health, BSC, and RW Patient agreeable to [facility], and RW.  Declines BSC  Referral made Jon with [employer] for RW Patient and wife state they do not have a preference of home health agency.  Referral made and accepted  by Kandee Keen with [facility]          Patient Goals and CMS Choice            Expected Discharge Plan and Services                                              Prior Living Arrangements/Services                       Activities of Daily Living   ADL Screening (condition at time of admission) Independently performs ADLs?: Yes (appropriate for developmental age) Is the patient deaf or have difficulty hearing?: No Does the patient have difficulty seeing, even when wearing glasses/contacts?: No Does the patient have difficulty concentrating, remembering, or making decisions?: No  Permission Sought/Granted                  Emotional Assessment              Admission diagnosis:  Upper GI bleed [K92.2] Patient Active Problem List   Diagnosis Date Noted   Melena 03/05/2024   Postpyloric ulcer 03/05/2024   Upper GI bleed 03/04/2024   Symptomatic anemia 03/04/2024   Leg edema, right 03/04/2024   AKI (acute kidney injury) (HCC) 03/04/2024   SVT (supraventricular tachycardia) (HCC) 02/28/2024   Paroxysmal atrial fibrillation (HCC) 02/26/2024   Pneumonia of left lung due to infectious organism 02/26/2024   Musculoskeletal back pain 02/26/2024   Elevated troponin 02/26/2024   DLBCL (diffuse large B cell lymphoma) (HCC) 02/19/2024    Shortness of breath 06/04/2023   Hypomagnesemia 01/28/2023   Cyst of skin 05/03/2022   Prediabetes 09/14/2021   Allergic rhinitis due to pollen 05/10/2021   Acute cough 04/13/2021   History of benzodiazepine use 03/15/2021   Controlled substance agreement signed 03/11/2021   Chronic heart failure with preserved ejection fraction (HCC) 09/12/2020   Obesity 02/18/2020   Purpura senilis (HCC) 02/12/2019   Hyperlipidemia LDL goal <70 02/12/2019   Coronary artery disease of native artery of native heart with stable angina pectoris (HCC) 01/21/2019   Depression, recurrent (HCC) 12/30/2018   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) 10/30/2018   BPH (benign prostatic hyperplasia) 06/26/2018   Advanced care planning/counseling discussion 06/26/2018   Gout 03/21/2016   Essential hypertension 05/25/2015   Esophageal lesion 05/25/2015   Chronic anxiety 05/25/2015   PCP:  Marjie Skiff, NP Pharmacy:   [facility] [phone] - [place], [place] - [address] AT [facility] [address] [place] [address] [place] [address] Phone: [phone] Fax:  [phone]     Social Drivers of Health (SDOH) Social History: SDOH Screenings   Food Insecurity: No Food Insecurity (03/04/2024)  Housing: Low Risk  (03/04/2024)  Transportation Needs: No Transportation Needs (03/04/2024)  Utilities: Not At Risk (03/04/2024)  Alcohol Screen: Low Risk  (07/17/2023)  Depression (PHQ2-9): High Risk (02/26/2024)  Financial Resource Strain: Low Risk  (01/29/2024)   Received from [facility]  Physical Activity: Insufficiently Active (07/17/2023)  Social Connections: Moderately Integrated (03/04/2024)  Stress: No Stress Concern Present (07/17/2023)  Tobacco Use: Low Risk  (03/04/2024)  Health Literacy: Adequate Health Literacy (07/17/2023)   SDOH Interventions:     Readmission Risk Interventions     No data to display

## 2024-03-07 NOTE — Progress Notes (Signed)
 JAH ALARID   DOB:04-04-1946   ZO#:[id number]    Subjective: No acute events overnight.  Patient resting comfortably in the bed.   Otherwise denies any blood or black loose stool.  No nausea or vomiting.    Patient states he noticed improvement of the leg swelling.  Continues to have bilateral arm swelling.  Objective:  Vitals:   03/07/24 0950 03/07/24 1020  BP: 130/88 128/78  Pulse: 62 62  Resp: 18 18  Temp: 98.2 F (36.8 C) 98.5 F (36.9 C)  SpO2: 95% 98%    No intake or output data in the 24 hours ending 03/07/24 1414  Bilateral upper lower extremity swelling.  Generalized neck adenopathy and axillary adenopathy. Physical Exam Vitals and nursing note reviewed.  HENT:     Head: Normocephalic and atraumatic.     Mouth/Throat:     Pharynx: Oropharynx is clear.  Eyes:     Extraocular Movements: Extraocular movements intact.     Pupils: Pupils are equal, round, and reactive to light.  Cardiovascular:     Rate and Rhythm: Normal rate and regular rhythm.  Pulmonary:     Comments: Decreased breath sounds bilaterally.  Abdominal:     Palpations: Abdomen is soft.  Musculoskeletal:        General: Normal range of motion.     Cervical back: Normal range of motion.     Right lower leg: Edema present.     Left lower leg: Edema present.  Skin:    General: Skin is warm.  Neurological:     General: No focal deficit present.     Mental Status: He is alert and oriented to person, place, and time.  Psychiatric:        Behavior: Behavior normal.        Judgment: Judgment normal.      Labs:  Lab Results  Component Value Date   WBC 6.7 03/07/2024   HGB 8.5 (L) 03/07/2024   HCT 24.9 (L) 03/07/2024   MCV 100.8 (H) 03/07/2024   PLT 349 03/07/2024   NEUTROABS 6.9 03/04/2024    Lab Results  Component Value Date   NA 131 (L) 03/06/2024   K 4.3 03/06/2024   CL 96 (L) 03/06/2024   CO2 25 03/06/2024    Studies:  US Venous Img Upper Bilat (DVT) Result Date:  03/06/2024 CLINICAL DATA:  Bilateral upper extremity swelling. EXAM: BILATERAL UPPER EXTREMITY VENOUS DOPPLER ULTRASOUND TECHNIQUE: Gray-scale sonography with graded compression, as well as color Doppler and duplex ultrasound were performed to evaluate the bilateral upper extremity deep venous systems from the level of the subclavian vein and including the jugular, axillary, basilic, radial, ulnar and upper cephalic vein. Spectral Doppler was utilized to evaluate flow at rest and with distal augmentation maneuvers. COMPARISON:  February 27, 2024. FINDINGS: RIGHT UPPER EXTREMITY Internal Jugular Vein: No evidence of thrombus. Normal compressibility, respiratory phasicity and response to augmentation. Subclavian Vein: No evidence of thrombus. Normal compressibility, respiratory phasicity and response to augmentation. Axillary Vein: No evidence of thrombus. Normal compressibility, respiratory phasicity and response to augmentation. Cephalic Vein: No evidence of thrombus. Normal compressibility, respiratory phasicity and response to augmentation. Basilic Vein: No evidence of thrombus. Normal compressibility, respiratory phasicity and response to augmentation. Brachial Veins: No evidence of thrombus. Normal compressibility, respiratory phasicity and response to augmentation. Radial Veins: No evidence of thrombus. Normal compressibility, respiratory phasicity and response to augmentation. Ulnar Veins: No evidence of thrombus. Normal compressibility, respiratory phasicity and response to augmentation. Venous Reflux:  None. Multiple enlarged lymph nodes are noted in right cervical region concerning for metastatic disease, the largest measuring 2.1 cm in minor axis. LEFT UPPER EXTREMITY Internal Jugular Vein: No evidence of thrombus. Normal compressibility, respiratory phasicity and response to augmentation. Subclavian Vein: No evidence of thrombus. Normal compressibility, respiratory phasicity and response to augmentation.  Axillary Vein: No evidence of thrombus. Normal compressibility, respiratory phasicity and response to augmentation. Cephalic Vein: No evidence of thrombus. Normal compressibility, respiratory phasicity and response to augmentation. Basilic Vein: No evidence of thrombus. Normal compressibility, respiratory phasicity and response to augmentation. Brachial Veins: No evidence of thrombus. Normal compressibility, respiratory phasicity and response to augmentation. Radial Veins: No evidence of thrombus. Normal compressibility, respiratory phasicity and response to augmentation. Ulnar Veins: No evidence of thrombus. Normal compressibility, respiratory phasicity and response to augmentation. Venous Reflux:  None. Other Findings: Multiple enlarged left cervical lymph nodes are noted consistent with metastatic disease, the largest measuring 2.8 cm in minor axis. IMPRESSION: No evidence of DVT within either upper extremity. Bilateral cervical adenopathy is noted consistent with metastatic disease. Electronically Signed   By: Lupita Raider M.D.   On: 03/06/2024 53:45    78 year old male patient history of atrial fibrillation on Eliquis, diffuse large B-cell lymphoma , HFpEF, CAD s/p PCI with DES to left circumflex and LAD,COPD, who presents to the ED due to melena.   # Diffuse large B-cell lymphoma-EBV associated-non-GCB subtype currently status post 1 treatment of rituximab approximately 2 weeks ago.  Clinically noted to have progression of disease based on lymphadenopathy and also rising LDH. #1  R-CHOP chemo on 3/28. 3/27 started prednisone 50 mg a day x 10 days.   # Severe anemia hemoglobin 6.5-on admission suspect secondary to GI bleed/and also underlying lymphoma.  Rule out hemolysis- haptoglobin-WNL- .  S/p EGD- on PPI-hemoglobin is 8.4 overall stable.  Monitor closely.  # Bilateral extremity upper extremity edema-likely third spacing/lymphatic blockage on the left side- bilateral upper extremity  Doppler-negative for DVT.   # A-fib-continue metoprolol/and amiodarone.  Currently-Eliquis on hold.   # Prophylaxis: TLS- on allopurinol; ID- on Acyclovir.   # Discussed with chemo RN-  If patient is discharged from the hospital over the weekend-appointments scheduled for close follow-up next week for G-CSF injections. Pending discharge will start patient on short acting GCSF in hospital starting tomorrow-  Earna Coder, MD 03/07/2024  2:14 PM

## 2024-03-07 NOTE — Progress Notes (Signed)
 X

## 2024-03-07 NOTE — Progress Notes (Signed)
 Jesse Norton received Chemotherapy treatment today. Tolerated treatment well with no issues. Vital signs stable throughout treatment.

## 2024-03-07 NOTE — Progress Notes (Signed)
 PT Cancellation Note  Patient Details Name: Jesse Norton MRN: [id number] DOB: 1946/05/17   Cancelled Treatment:     Author attempted several times this afternoon to progress pt with OOB activity and perform stairs. Each attempt, pt had RN care going on or he was eating lunch, etc. Thereasa Parkin will personally return tomorrow as requested to perform stairs in prep for safe DC home once deemed medically stable.    Rushie Chestnut 03/07/2024, 4:06 PM

## 2024-03-08 ENCOUNTER — Encounter: Payer: Self-pay | Admitting: Internal Medicine

## 2024-03-08 DIAGNOSIS — K922 Gastrointestinal hemorrhage, unspecified: Secondary | ICD-10-CM | POA: Diagnosis not present

## 2024-03-08 DIAGNOSIS — C8330 Diffuse large B-cell lymphoma, unspecified site: Secondary | ICD-10-CM | POA: Diagnosis not present

## 2024-03-08 MED ORDER — CLOPIDOGREL BISULFATE 75 MG PO TABS
75.0000 mg | ORAL_TABLET | Freq: Every day | ORAL | Status: DC
  Administered 2024-03-08 – 2024-03-12 (×5): 75 mg via ORAL
  Filled 2024-03-08 (×6): qty 1

## 2024-03-08 MED ORDER — CHLORHEXIDINE GLUCONATE CLOTH 2 % EX PADS
6.0000 | MEDICATED_PAD | Freq: Every day | CUTANEOUS | Status: DC
  Administered 2024-03-08 – 2024-03-14 (×7): 6 via TOPICAL

## 2024-03-08 NOTE — Progress Notes (Signed)
 Physical Therapy Treatment Patient Details Name: Jesse Norton MRN: [id number] DOB: 1946/07/17 Today's Date: 03/08/2024   History of Present Illness Pt is a 78 y.o. male  who presents to the ED due to melena. Admitted for management of upper GI bleed, anemia, AKI. 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    PT Comments  Pt was long sitting in bed upon arrival. He is A and O x 4. Agreeable and pleasant throughout session. Pt was able to exit bed, stand, and ambulate with supervision. Overall pt has poor activity tolerance and fatigued quickly with ambulation and stair performance. Pt only able to tolerate up/down 3 steps before needing seated rest. Discussed recommendation for use of W/C + both son's assist with getting pt in/out of house at [place]. MD aware. Acute PT will continue to follow per current POC progressing as able. Pt will continue to benefit form skilled PT at [place] to maximize his activity tolerance and safety with all ADLs.     If plan is discharge home, recommend the following: A little help with walking and/or transfers;A little help with bathing/dressing/bathroom;Help with stairs or ramp for entrance;Assist for transportation     Equipment Recommendations  Wheelchair (measurements PT);Wheelchair cushion (measurements PT);Rolling walker (2 wheels);BSC/3in1       Precautions / Restrictions Precautions Precautions: Fall Recall of Precautions/Restrictions: Intact Restrictions Weight Bearing Restrictions Per Provider Order: No     Mobility  Bed Mobility Overal bed mobility: Needs Assistance Bed Mobility: Supine to Sit  Supine to sit: Supervision   Transfers Overall transfer level: Needs assistance Equipment used: Rolling walker (2 wheels) Transfers: Sit to/from Stand Sit to Stand: Supervision   Ambulation/Gait Ambulation/Gait assistance:  Supervision Gait Distance (Feet): 50 Feet Assistive device: Rolling walker (2 wheels) Gait Pattern/deviations: Step-through pattern  General Gait Details: pt ambulated 2 x ~ 50 ft with RW. w/c follow and pt pushed to rehab gym for stair training   Stairs Stairs: Yes Stairs assistance: Contact guard assist Stair Management: One rail Left, Step to pattern, Sideways Number of Stairs: 3 General stair comments: Pt was able to ascend/descend 3 stairs but endorses severe fatigue and slight dizziness. encouraged pt to have both sons to assist hm with getting into out of his house at [place]   Balance Overall balance assessment: Needs assistance Sitting-balance support: Bilateral upper extremity supported, Feet supported Sitting balance-Leahy Scale: Good     Standing balance support: Bilateral upper extremity supported Standing balance-Leahy Scale: Good     Communication Communication Communication: No apparent difficulties  Cognition Arousal: Alert, Lethargic Behavior During Therapy: WFL for tasks assessed/performed   PT - Cognitive impairments: No apparent impairments      PT - Cognition Comments: pleasant and agreeable to session Following commands: Intact            General Comments General comments (skin integrity, edema, etc.): discussed ordering him a WC for home use due to his limited activity tolerance and upcoming treatments. He is agreeable to [employer] loss [employer] ordering. MD made aware      Pertinent Vitals/Pain Pain Assessment Pain Assessment: No/denies pain     PT Goals (current goals can now be found in the care plan section) Acute Rehab PT Goals Patient Stated Goal: to go home Progress towards PT goals: Progressing toward goals    Frequency    Min 2X/week       AM-PAC PT "6  Clicks" Mobility   Outcome Measure  Help needed turning from your back to your side while in a flat bed without using bedrails?: None Help needed moving from lying on your back to sitting on  the side of a flat bed without using bedrails?: A Little Help needed moving to and from a bed to a chair (including a wheelchair)?: A Little Help needed standing up from a chair using your arms (e.g., wheelchair or bedside chair)?: A Little Help needed to walk in hospital room?: A Little Help needed climbing 3-5 steps with a railing? : A Little 6 Click Score: 19    End of Session   Activity Tolerance: Patient tolerated treatment well Patient left: in chair;with call bell/phone within reach;with nursing/sitter in room;with family/visitor present Nurse Communication: Mobility status PT Visit Diagnosis: Muscle weakness (generalized) (M62.81);Difficulty in walking, not elsewhere classified (R26.2);Unsteadiness on feet (R26.81)     Time: 1610-9604 PT Time Calculation (min) (ACUTE ONLY): 24 min  Charges:    $Gait Training: 23-37 mins PT General Charges $$ ACUTE PT VISIT: 1 Visit                    Jetta Lout PTA 03/08/24, 12:25 PM

## 2024-03-08 NOTE — Progress Notes (Signed)
 PROGRESS NOTE Jesse Norton    DOB: 10/08/1946, 78 y.o.  ZOX:[id number]    Code Status: Full Code   DOA: 03/04/2024   LOS: 4   Brief hospital course  Jesse Norton is a 78 y.o. 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 Of note, patient was recently admitted for atrial fibrillation with RVR, new diagnosis and started on amiodarone, metoprolol, and Eliquis.  During this admission, he was also treated for CAP with azithromycin and ceftriaxone.   ED course: 112/59 with heart rate of 71.  He was saturating at 93% on room air.  He was afebrile at 97.8.  Initial workup notable for hemoglobin of 6.5, MCV of 110, creatinine 1.44, sodium 129, AST 50, GFR 49.  INR 1.6.  2 units of packed RBCs ordered and patient started on Protonix.   3/26- EGD without active bleed, positive esophageal candidal infection.  3/28: chemo treatment completed and tolerated well 03/08/24 -hgb stable. Restarting plavix due to h/o DES. Continue PT evaluation for goal to go home in next couple days  Assessment & Plan  Principal Problem:   Upper GI bleed Active Problems:   Symptomatic anemia   AKI (acute kidney injury) (HCC)   Leg edema, right   Paroxysmal atrial fibrillation (HCC)   Chronic heart failure with preserved ejection fraction (HCC)   DLBCL (diffuse large B cell lymphoma) (HCC)   Coronary artery disease of native artery of native heart with stable angina pectoris (HCC)   Esophageal lesion   Melena   Postpyloric ulcer  Upper GI bleed- relating to recent eliquis initiation for afib Symptomatic anemia- symptoms improving. Partial bleed and lymphoma contributing. Hgb 6.5>>8.6>8.8>8.5 s/p transfusions prior history of gastric ulcer and Barrett's esophagus.   White plaque on lower  esophagus, gastric antrum erosions, duodenal ulcers. None actively bleeding. No biopsies were collected. Found on EGD 3/26. - negative H. Pylori - continue hold eliquis - s/p 2 units pRBCs - heme/onc following -CBC am  Candidal esophagitis- brushings obtained in EGD positive for yeast.  - diflucan completed - GI consulted; appreciate their recommendations - advance to regular diet -  Continue Protonix 40 mg twice daily  DLBCL (diffuse large B cell lymphoma) (HCC)-  - Oncology consulted; appreciate their recommendations.Chemo 3/28.  - UE Doppler was negative for DVT. Positive for lymphadenopathy.  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  - restart plavix  today after discussing risks/benefits with patient    AKI- resolved. Normal baseline. Cr 1.44>1.27>1.10    Paroxysmal atrial fibrillation (HCC)-  Currently in sinus rhythm - Continue amiodarone and metoprolol - Hold home Eliquis. Have discussion about risks/benefits about restarting with shared-decision making with patient and wife   Chronic heart failure with preserved ejection fraction (HCC) Patient appears hypervolemic on examination - continue home diuretics now that AKI completed - Strict in and out - Daily weights   Hyponatremia- mild.  - monitor periodically  Body mass index is 34.92 kg/m.  VTE ppx: SCDs Start: 03/04/24 1739  Diet:     Diet   DIET SOFT Room service appropriate? Yes; Fluid consistency: Thin   Consultants: GI Heme/onc  Subjective 03/08/24    Pt  reports feeling fine today. He is looking forward to getting strength back and being able to go home   Objective   Vitals:   03/07/24 1451 03/07/24 2026 03/08/24 0352 03/08/24 0356  BP: (!) 116/58 (!) 102/47 117/69   Pulse: 70 71 67   Resp: 16 20 20    Temp: 97.8 F (36.6 C) (!) 97.3 F (36.3 C) (!) 97.4 F (36.3  C)   TempSrc: Oral  Oral   SpO2: 94% 92% 97%   Weight:    110.4 kg  Height:        Intake/Output Summary (Last 24 hours) at 03/08/2024 0714 Last data filed at 03/07/2024 1500 Gross per 24 hour  Intake 806.08 ml  Output --  Net 806.08 ml    Filed Weights   03/04/24 1407 03/07/24 0455 03/08/24 0356  Weight: 106.6 kg 111.4 kg 110.4 kg    Physical Exam:  General: awake, alert, NAD HEENT: atraumatic, clear conjunctiva, anicteric sclera, MMM, hearing grossly normal Respiratory: normal respiratory effort. CTAB. Healing incision on R chest.  Cardiovascular: quick capillary refill, normal S1/S2, RRR, no JVD, murmurs Gastrointestinal: soft, NT, ND Nervous: A&O x3. no gross focal neurologic deficits, normal speech Extremities: moves all equally, non pitting edema, normal tone Skin: dry, intact, normal temperature, normal color. No rashes, lesions or ulcers on exposed skin Psychiatry: normal mood, congruent affect  Labs   I have personally reviewed the following labs and imaging studies CBC    Component Value Date/Time   WBC 6.7 03/07/2024 0444   RBC 2.47 (L) 03/07/2024 0444   HGB 8.5 (L) 03/07/2024 0444   HGB 9.0 (L) 03/05/2024 0446   HCT 24.9 (L) 03/07/2024 0444   HCT 38.9 01/26/2023 0946   PLT 349 03/07/2024 0444   PLT 386 03/04/2024 1230   PLT 322 01/26/2023 0946   MCV 100.8 (H) 03/07/2024 0444   MCV 107 (H) 01/26/2023 0946   MCV 95 01/06/2014 1007   MCH 34.4 (H) 03/07/2024 0444   MCHC 34.1 03/07/2024 0444   RDW 21.2 (H) 03/07/2024 0444   RDW 12.0 01/26/2023 0946   RDW 13.6 01/06/2014 1007   LYMPHSABS 0.6 (L) 03/04/2024 1529   LYMPHSABS 2.4 01/26/2023 0946   MONOABS 0.7 03/04/2024 1529   EOSABS 0.1 03/04/2024 1529   EOSABS 0.5 (H) 01/26/2023 0946   BASOSABS 0.1 03/04/2024 1529   BASOSABS 0.2 01/26/2023 0946      Latest Ref Rng & Units 03/06/2024    4:34 AM 03/05/2024    4:46 AM 03/04/2024   12:30 PM  BMP  Glucose 70 - 99 mg/dL 84  99  161   BUN 8 - 23 mg/dL 29   33  33   Creatinine 0.61 - 1.24 mg/dL 0.96  0.45  4.09   Sodium 135 - 145 mmol/L 131  131  129   Potassium 3.5 - 5.1 mmol/L 4.3  4.2  4.6   Chloride 98 - 111 mmol/L 96  97  93   CO2 22 - 32 mmol/L 25  24  22    Calcium 8.9 - 10.3 mg/dL 8.7  9.0  8.8     US Venous Img Upper Bilat (DVT) Result Date: 03/06/2024 CLINICAL DATA:  Bilateral upper extremity swelling. EXAM: BILATERAL UPPER EXTREMITY VENOUS DOPPLER ULTRASOUND TECHNIQUE: Gray-scale sonography with graded compression, as well as color Doppler and duplex ultrasound were performed to evaluate the bilateral upper extremity deep venous systems from the level of the subclavian vein and including the jugular, axillary, basilic, radial,  ulnar and upper cephalic vein. Spectral Doppler was utilized to evaluate flow at rest and with distal augmentation maneuvers. COMPARISON:  February 27, 2024. FINDINGS: RIGHT UPPER EXTREMITY Internal Jugular Vein: No evidence of thrombus. Normal compressibility, respiratory phasicity and response to augmentation. Subclavian Vein: No evidence of thrombus. Normal compressibility, respiratory phasicity and response to augmentation. Axillary Vein: No evidence of thrombus. Normal compressibility, respiratory phasicity and response to augmentation. Cephalic Vein: No evidence of thrombus. Normal compressibility, respiratory phasicity and response to augmentation. Basilic Vein: No evidence of thrombus. Normal compressibility, respiratory phasicity and response to augmentation. Brachial Veins: No evidence of thrombus. Normal compressibility, respiratory phasicity and response to augmentation. Radial Veins: No evidence of thrombus. Normal compressibility, respiratory phasicity and response to augmentation. Ulnar Veins: No evidence of thrombus. Normal compressibility, respiratory phasicity and response to augmentation. Venous Reflux:  None. Multiple enlarged lymph nodes are noted in right cervical region concerning for metastatic disease,  the largest measuring 2.1 cm in minor axis. LEFT UPPER EXTREMITY Internal Jugular Vein: No evidence of thrombus. Normal compressibility, respiratory phasicity and response to augmentation. Subclavian Vein: No evidence of thrombus. Normal compressibility, respiratory phasicity and response to augmentation. Axillary Vein: No evidence of thrombus. Normal compressibility, respiratory phasicity and response to augmentation. Cephalic Vein: No evidence of thrombus. Normal compressibility, respiratory phasicity and response to augmentation. Basilic Vein: No evidence of thrombus. Normal compressibility, respiratory phasicity and response to augmentation. Brachial Veins: No evidence of thrombus. Normal compressibility, respiratory phasicity and response to augmentation. Radial Veins: No evidence of thrombus. Normal compressibility, respiratory phasicity and response to augmentation. Ulnar Veins: No evidence of thrombus. Normal compressibility, respiratory phasicity and response to augmentation. Venous Reflux:  None. Other Findings: Multiple enlarged left cervical lymph nodes are noted consistent with metastatic disease, the largest measuring 2.8 cm in minor axis. IMPRESSION: No evidence of DVT within either upper extremity. Bilateral cervical adenopathy is noted consistent with metastatic disease. Electronically Signed   By: Lupita Raider M.D.   On: 03/06/2024 14:43    Disposition Plan & Communication  Patient status: Inpatient  Admitted From: Home Planned disposition location: Home Anticipated discharge date: 3/31 pending safe dispo. Very physically deconditioned  Family Communication: none at bedside    Author: Leeroy Bock, DO [facility] 03/08/2024, 7:14 AM   Available by Epic secure chat 7AM-7PM. If 7PM-7AM, please contact night-coverage.  [facility] contact information found on [email or web address].

## 2024-03-08 NOTE — Plan of Care (Signed)

## 2024-03-09 DIAGNOSIS — C8330 Diffuse large B-cell lymphoma, unspecified site: Secondary | ICD-10-CM | POA: Diagnosis not present

## 2024-03-09 DIAGNOSIS — K922 Gastrointestinal hemorrhage, unspecified: Secondary | ICD-10-CM | POA: Diagnosis not present

## 2024-03-09 LAB — BASIC METABOLIC PANEL WITH GFR
Anion gap: 10 (ref 5–15)
BUN: 43 mg/dL — ABNORMAL HIGH (ref 8–23)
CO2: 24 mmol/L (ref 22–32)
Calcium: 8.4 mg/dL — ABNORMAL LOW (ref 8.9–10.3)
Chloride: 97 mmol/L — ABNORMAL LOW (ref 98–111)
Creatinine, Ser: 1.24 mg/dL (ref 0.61–1.24)
GFR, Estimated: 59 mL/min — ABNORMAL LOW (ref >60)
Glucose, Bld: 85 mg/dL (ref 70–99)
Potassium: 3.9 mmol/L (ref 3.5–5.1)
Sodium: 131 mmol/L — ABNORMAL LOW (ref 135–145)

## 2024-03-09 LAB — CBC
HCT: 22.8 % — ABNORMAL LOW (ref 39.0–52.0)
Hemoglobin: 7.8 g/dL — ABNORMAL LOW (ref 13.0–17.0)
MCH: 34.7 pg — ABNORMAL HIGH (ref 26.0–34.0)
MCHC: 34.2 g/dL (ref 30.0–36.0)
MCV: 101.3 fL — ABNORMAL HIGH (ref 80.0–100.0)
Platelets: 317 10*3/uL (ref 150–400)
RBC: 2.25 MIL/uL — ABNORMAL LOW (ref 4.22–5.81)
RDW: 19.9 % — ABNORMAL HIGH (ref 11.5–15.5)
WBC: 11.2 10*3/uL — ABNORMAL HIGH (ref 4.0–10.5)
nRBC: 0.9 % — ABNORMAL HIGH (ref 0.0–0.2)

## 2024-03-09 MED ORDER — MIRTAZAPINE 15 MG PO TABS
7.5000 mg | ORAL_TABLET | Freq: Every day | ORAL | Status: DC
  Administered 2024-03-09: 7.5 mg via ORAL
  Filled 2024-03-09: qty 1

## 2024-03-09 MED ORDER — FLUOXETINE HCL 10 MG PO CAPS
10.0000 mg | ORAL_CAPSULE | Freq: Every day | ORAL | Status: DC
  Administered 2024-03-09 – 2024-03-13 (×5): 10 mg via ORAL
  Filled 2024-03-09 (×6): qty 1

## 2024-03-09 NOTE — Progress Notes (Signed)
 PROGRESS NOTE LENDELL GALLICK    DOB: [birth year], 78 y.o.  ZOX:[id number]    Code Status: Full Code   DOA: 03/04/2024   LOS: 5   Brief hospital course  Jesse Norton is a 78 y.o. 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 Of note, patient was recently admitted for atrial fibrillation with RVR, new diagnosis and started on amiodarone, metoprolol, and Eliquis.  During this admission, he was also treated for CAP with azithromycin and ceftriaxone.   ED course: 112/59 with heart rate of 71.  He was saturating at 93% on room air.  He was afebrile at 97.8.  Initial workup notable for hemoglobin of 6.5, MCV of 110, creatinine 1.44, sodium 129, AST 50, GFR 49.  INR 1.6.  2 units of packed RBCs ordered and patient started on Protonix.   3/26- EGD without active bleed, positive esophageal candidal infection.  3/28: chemo treatment completed and tolerated well presently-hgb roughly stable. Restarted plavix due to h/o DES. Continue PT evaluation for goal to go home in next couple days Experiencing weakness/nausea in post-chemo setting.   Assessment & Plan  Principal Problem:   Upper GI bleed Active Problems:   Symptomatic anemia   AKI (acute kidney injury) (HCC)   Leg edema, right   Paroxysmal atrial fibrillation (HCC)   Chronic heart failure with preserved ejection fraction (HCC)   DLBCL (diffuse large B cell lymphoma) (HCC)   Coronary artery disease of native artery of native heart with stable angina pectoris (HCC)   Esophageal lesion   Melena   Postpyloric ulcer  Upper GI bleed- relating to recent eliquis initiation for afib Symptomatic anemia- symptoms improving. Partial bleed and lymphoma contributing. Hgb 6.5>>8.6>8.8>8.5>7.8 s/p transfusions prior history of  gastric ulcer and Barrett's esophagus.   White plaque on lower esophagus, gastric antrum erosions, duodenal ulcers. None actively bleeding. No biopsies were collected. Found on EGD 3/26. - negative H. Pylori - continue hold eliquis - s/p 2 units pRBCs - heme/onc following -CBC am  Candidal esophagitis- brushings obtained in EGD positive for yeast.  - diflucan completed - GI consulted; appreciate their recommendations - advance to regular diet -  Continue Protonix 40 mg twice daily  DLBCL (diffuse large B cell lymphoma) (HCC)-  - Oncology consulted; appreciate their recommendations.Chemo 3/28.  - UE Doppler was negative for DVT. Positive for lymphadenopathy.  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  - restart plavix  today after discussing risks/benefits with patient    AKI- resolved. Normal baseline. Cr 1.44>1.2  Depression- chronic and in acute grief currently - continue home prozac - trial of remeron tonight for sleep, appetite, depression treatment    Paroxysmal atrial fibrillation (HCC)-  Currently in sinus rhythm - Continue amiodarone and metoprolol - Hold home Eliquis. Have discussion about risks/benefits about restarting with shared-decision making with patient and wife   Chronic heart failure with preserved ejection fraction (HCC) Patient appears hypervolemic on examination - continue home diuretics now that AKI completed - Strict in and out - Daily weights   Hyponatremia- mild.  - monitor periodically  Body mass index is 34.8 kg/m.  VTE ppx: SCDs Start: 03/04/24 1739  Diet:  Diet   Diet regular Fluid consistency: Thin   Consultants: GI Heme/onc  Subjective 03/09/24    Pt reports feeling very tired today. Didn't sleep much last night and he is nauseas this am. Feeling depressed and no appetite. Wants to see family today    Objective   Vitals:   03/08/24 1531 03/08/24 2051 03/08/24 2121 03/09/24 0500  BP: (!) 102/58 118/60 (!) 103/54   Pulse: 70 74 73   Resp: 20 19 18    Temp: 97.8 F (36.6 C) 97.7 F (36.5 C) (!) 97.5 F (36.4 C)   TempSrc:  Oral Oral   SpO2: 95% 96% 99%   Weight:    110 kg  Height:        Intake/Output Summary (Last 24 hours) at 03/09/2024 0717 Last data filed at 03/08/2024 2051 Gross per 24 hour  Intake 240 ml  Output --  Net 240 ml    Filed Weights   03/07/24 0455 03/08/24 0356 03/09/24 0500  Weight: 111.4 kg 110.4 kg 110 kg    Physical Exam:  General: awake, alert, NAD HEENT: atraumatic, clear conjunctiva, anicteric sclera, MMM, hearing grossly normal Respiratory: normal respiratory effort. CTAB. Healing incision on R chest.  Cardiovascular: quick capillary refill, normal S1/S2, RRR, no JVD, murmurs Nervous: A&O x3. no gross focal neurologic deficits, normal speech Extremities: moves all equally, non pitting edema, normal tone. Left arm continues to be edematous and wife describes drainage but there is no drainage on my exam Skin: dry, intact, normal temperature, normal color. No rashes, lesions or ulcers on exposed skin Psychiatry: normal mood, congruent affect  Labs   I have personally reviewed the following labs and imaging studies CBC    Component Value Date/Time   WBC 11.2 (H) 03/09/2024 0605   RBC 2.25 (L) 03/09/2024 0605   HGB 7.8 (L) 03/09/2024 0605   HGB 9.0 (L) 03/05/2024 0446   HCT 22.8 (L) 03/09/2024 0605   HCT 38.9 01/26/2023 0946   PLT 317 03/09/2024 0605   PLT 386 03/04/2024 1230   PLT 322 01/26/2023 0946   MCV 101.3 (H) 03/09/2024 0605   MCV 107 (H) 01/26/2023 0946   MCV 95 01/06/2014 1007   MCH 34.7 (H) 03/09/2024 0605   MCHC 34.2 03/09/2024 0605   RDW 19.9 (H) 03/09/2024 0605   RDW 12.0 01/26/2023 0946   RDW 13.6 01/06/2014 1007   LYMPHSABS 0.6 (L) 03/04/2024 1529   LYMPHSABS 2.4 01/26/2023 0946   MONOABS 0.7 03/04/2024 1529   EOSABS  0.1 03/04/2024 1529   EOSABS 0.5 (H) 01/26/2023 0946   BASOSABS 0.1 03/04/2024 1529   BASOSABS 0.2 01/26/2023 0946      Latest Ref Rng & Units 03/09/2024    6:05 AM 03/06/2024    4:34 AM 03/05/2024    4:46 AM  BMP  Glucose 70 - 99 mg/dL 85  84  99   BUN 8 - 23 mg/dL 43  29  33   Creatinine 0.61 - 1.24 mg/dL 0.98  1.19  1.47   Sodium 135 - 145 mmol/L 131  131  131   Potassium 3.5 - 5.1 mmol/L 3.9  4.3  4.2   Chloride 98 - 111 mmol/L 97  96  97   CO2 22 - 32 mmol/L 24  25  24    Calcium 8.9 - 10.3 mg/dL 8.4  8.7  9.0     No results found.   Disposition Plan & Communication  Patient status: Inpatient  Admitted From: Home Planned disposition  location: Home Anticipated discharge date: 4/1 pending safe dispo. Very physically deconditioned  Family Communication: wife at bedside    Author: Leeroy Bock, DO [facility] 03/09/2024, 7:17 AM   Available by Epic secure chat 7AM-7PM. If 7PM-7AM, please contact night-coverage.  [facility] contact information found on [email or web address].

## 2024-03-10 ENCOUNTER — Inpatient Hospital Stay

## 2024-03-10 ENCOUNTER — Inpatient Hospital Stay: Admitting: Nurse Practitioner

## 2024-03-10 ENCOUNTER — Other Ambulatory Visit: Payer: Self-pay | Admitting: Internal Medicine

## 2024-03-10 DIAGNOSIS — K922 Gastrointestinal hemorrhage, unspecified: Secondary | ICD-10-CM | POA: Diagnosis not present

## 2024-03-10 DIAGNOSIS — C8333 Diffuse large B-cell lymphoma, intra-abdominal lymph nodes: Secondary | ICD-10-CM | POA: Diagnosis not present

## 2024-03-10 LAB — CBC
HCT: 21.5 % — ABNORMAL LOW (ref 39.0–52.0)
Hemoglobin: 7.3 g/dL — ABNORMAL LOW (ref 13.0–17.0)
MCH: 34.4 pg — ABNORMAL HIGH (ref 26.0–34.0)
MCHC: 34 g/dL (ref 30.0–36.0)
MCV: 101.4 fL — ABNORMAL HIGH (ref 80.0–100.0)
Platelets: 261 10*3/uL (ref 150–400)
RBC: 2.12 MIL/uL — ABNORMAL LOW (ref 4.22–5.81)
RDW: 20 % — ABNORMAL HIGH (ref 11.5–15.5)
WBC: 11.3 10*3/uL — ABNORMAL HIGH (ref 4.0–10.5)
nRBC: 0.4 % — ABNORMAL HIGH (ref 0.0–0.2)

## 2024-03-10 LAB — PREPARE RBC (CROSSMATCH)

## 2024-03-10 MED ORDER — MIDODRINE HCL 5 MG PO TABS
10.0000 mg | ORAL_TABLET | Freq: Three times a day (TID) | ORAL | Status: DC
  Administered 2024-03-10 – 2024-03-14 (×12): 10 mg via ORAL
  Filled 2024-03-10 (×12): qty 2

## 2024-03-10 MED ORDER — MIRTAZAPINE 15 MG PO TABS
7.5000 mg | ORAL_TABLET | Freq: Every day | ORAL | Status: DC
  Administered 2024-03-10 – 2024-03-13 (×4): 7.5 mg via ORAL
  Filled 2024-03-10 (×4): qty 1

## 2024-03-10 MED ORDER — SODIUM CHLORIDE 0.9% IV SOLUTION: Freq: Once | INTRAVENOUS | Status: AC

## 2024-03-10 MED ORDER — SODIUM CHLORIDE 0.9 % IV BOLUS
1000.0000 mL | Freq: Once | INTRAVENOUS | Status: AC
  Administered 2024-03-10: 1000 mL via INTRAVENOUS

## 2024-03-10 MED ORDER — ADULT MULTIVITAMIN W/MINERALS CH
1.0000 | ORAL_TABLET | Freq: Every day | ORAL | Status: DC
  Administered 2024-03-10 – 2024-03-14 (×5): 1 via ORAL
  Filled 2024-03-10 (×5): qty 1

## 2024-03-10 MED ORDER — FILGRASTIM-AAFI 480 MCG/0.8ML IJ SOSY
480.0000 ug | PREFILLED_SYRINGE | Freq: Every day | INTRAMUSCULAR | Status: DC
  Administered 2024-03-10 – 2024-03-13 (×4): 480 ug via SUBCUTANEOUS
  Filled 2024-03-10 (×4): qty 0.8

## 2024-03-10 MED ORDER — METOPROLOL SUCCINATE ER 25 MG PO TB24
12.5000 mg | ORAL_TABLET | Freq: Every day | ORAL | Status: DC
  Administered 2024-03-11 – 2024-03-13 (×3): 12.5 mg via ORAL
  Filled 2024-03-10 (×2): qty 1
  Filled 2024-03-10: qty 0.5
  Filled 2024-03-10: qty 1

## 2024-03-10 MED ORDER — ENSURE ENLIVE PO LIQD
237.0000 mL | Freq: Three times a day (TID) | ORAL | Status: DC
  Administered 2024-03-10 – 2024-03-13 (×10): 237 mL via ORAL

## 2024-03-10 NOTE — Progress Notes (Signed)
 Jesse Norton   DOB:1946/09/08   TG#:[id number]    Subjective: No acute events overnight.  Patient resting comfortably in the chair. No nausea or vomiting.  Patient states he noticed improvement of the leg swelling.  Continues to have bilateral arm swelling.  Objective:  Vitals:   03/10/24 1300 03/10/24 1355  BP: (!) 92/54 (!) 104/58  Pulse: 73 74  Resp: 18 16  Temp: (!) 97.4 F (36.3 C) 97.6 F (36.4 C)  SpO2: 94% 94%     Intake/Output Summary (Last 24 hours) at 03/10/2024 1424 Last data filed at 03/10/2024 1355 Gross per 24 hour  Intake 645 ml  Output --  Net 645 ml    Bilateral upper lower extremity swelling.  Generalized neck adenopathy and axillary adenopathy.  Physical Exam Vitals and nursing note reviewed.  HENT:     Head: Normocephalic and atraumatic.     Mouth/Throat:     Pharynx: Oropharynx is clear.  Eyes:     Extraocular Movements: Extraocular movements intact.     Pupils: Pupils are equal, round, and reactive to light.  Cardiovascular:     Rate and Rhythm: Normal rate and regular rhythm.  Pulmonary:     Comments: Decreased breath sounds bilaterally.  Abdominal:     Palpations: Abdomen is soft.  Musculoskeletal:        General: Normal range of motion.     Cervical back: Normal range of motion.     Right lower leg: Edema present.     Left lower leg: Edema present.  Skin:    General: Skin is warm.  Neurological:     General: No focal deficit present.     Mental Status: He is alert and oriented to person, place, and time.  Psychiatric:        Behavior: Behavior normal.        Judgment: Judgment normal.      Labs:  Lab Results  Component Value Date   WBC 11.3 (H) 03/10/2024   HGB 7.3 (L) 03/10/2024   HCT 21.5 (L) 03/10/2024   MCV 101.4 (H) 03/10/2024   PLT 261 03/10/2024   NEUTROABS 6.9 03/04/2024    Lab Results  Component Value Date   NA 131 (L) 03/09/2024   K 3.9 03/09/2024   CL 97 (L) 03/09/2024   CO2 24 03/09/2024    Studies:  No  results found.   78 year old male patient history of atrial fibrillation on Eliquis, diffuse large B-cell lymphoma , HFpEF, CAD s/p PCI with DES to left circumflex and LAD,COPD, who presents to the ED due to melena.   # Diffuse large B-cell lymphoma-EBV associated-non-GCB subtype currently status post 1 treatment of rituximab approximately 2 weeks ago.  Clinically noted to have progression of disease based on lymphadenopathy and also rising LDH. #1  R-CHOP chemo on 3/28. 3/27 started prednisone 50 mg a day x 10 days.  Clinically stable.   # Severe anemia hemoglobin 6.5-on admission suspect secondary to GI bleed/and also underlying lymphoma. haptoglobin-WNL- .  S/p EGD- on PPI-agree with blood transfusion to keep hemoglobin above 8.  Continue growth factor support on a daily basis.   # Bilateral extremity upper extremity edema-likely third spacing/lymphatic blockage on the left side- bilateral upper extremity Doppler-negative for DVT.   # A-fib-continue metoprolol/and amiodarone.  Currently-Eliquis on hold.   # Prophylaxis: TLS- on allopurinol; ID- on Acyclovir.  Monitor closely for tumor lysis.    Earna Coder, MD 03/10/2024  2:24 PM

## 2024-03-10 NOTE — TOC Progression Note (Signed)
 Transition of Care [facility]) - Progression Note    Patient Details  Name: Jesse Norton MRN: [id number] Date of Birth: 02-19-1946  Transition of [facility] CM/SW Contact  Margarito Liner, LCSW Phone Number: 03/10/2024, 2:35 PM  Clinical Narrative:  TOC continues to follow progress.   Expected Discharge Plan and Services                                               Social Determinants of Health (SDOH) Interventions SDOH Screenings   Food Insecurity: No Food Insecurity (03/04/2024)  Housing: Low Risk  (03/04/2024)  Transportation Needs: No Transportation Needs (03/04/2024)  Utilities: Not At Risk (03/04/2024)  Alcohol Screen: Low Risk  (07/17/2023)  Depression (PHQ2-9): High Risk (02/26/2024)  Financial Resource Strain: Low Risk  (01/29/2024)   Received from [facility]  Physical Activity: Insufficiently Active (07/17/2023)  Social Connections: Moderately Integrated (03/04/2024)  Stress: No Stress Concern Present (07/17/2023)  Tobacco Use: Low Risk  (03/04/2024)  Health Literacy: Adequate Health Literacy (07/17/2023)    Readmission Risk Interventions    03/07/2024   10:43 AM  Readmission Risk Prevention Plan  Transportation Screening Complete  HRI or Home Care Consult Complete  SW Recovery Care/Counseling Consult Complete  Skilled Nursing Facility Patient Refused

## 2024-03-10 NOTE — Consult Note (Signed)
 [facility] Liaison Note  03/10/2024  Jesse Norton 01-06-1946 [id number]  Location: RN Hospital Liaison met patient at bedside at [facility].  Insurance: Medicare   Jesse Norton is a 78 y.o. male who is a Primary Care Patient of Cannady, Dorie Rank, NP [facility]. The patient was screened for 7 and 30 day readmission hospitalization with noted high risk score for unplanned readmission risk with 2 IP in 6 months.  The patient was assessed for potential Care Management service needs for post hospital transition for care coordination. Review of patient's electronic medical record reveals patient was admitted for Atrial Fibrillation. Liaison attempt bedside visit however pt was on the phone. Also attempted outreach to his spouse unsuccessful. Pt follow heavily by oncology.Pt recommended for [facility] upon his discharge.   Plan: [facility] Liaison will continue to follow progress and disposition to asess for post hospital community care coordination/management needs.  Referral request for community care coordination: anticipate Transitions of Care Team follow up.   [facility] Care Management/Population Health does not replace or interfere with any arrangements made by the Inpatient Transition of Care team.   For questions contact:   Jesse Cousin, RN, BSN Hospital Liaison [place]   [facility], Population Health Office Hours MTWF  8:00 am-6:00 pm Direct Dial: [phone] mobile [email or web address]

## 2024-03-10 NOTE — Progress Notes (Signed)
 Initial Nutrition Assessment  DOCUMENTATION CODES:   Obesity unspecified  INTERVENTION:   -Ensure Enlive po TID, each supplement provides 350 kcal and 20 grams of protein.  -MVI with minerals daily -Continue regular diet  NUTRITION DIAGNOSIS:   Increased nutrient needs related to cancer and cancer related treatments as evidenced by estimated needs.  GOAL:   Patient will meet greater than or equal to 90% of their needs  MONITOR:   PO intake, Supplement acceptance  REASON FOR ASSESSMENT:   Consult Assessment of nutrition requirement/status  ASSESSMENT:   Pt with medical history significant of Atrial fibrillation on Eliquis, diffuse large B-cell lymphoma on chemotherapy, HFpEF, CAD s/p PCI with DES to left circumflex and LAD, Barrett's esophagitis, MALT lymphoma, hypertension, COPD, who presents due to melena.  Pt admitted with upper GIB.   3/26- s/p EGD- revealed esophageal plaques (suspicious for candidiasis), erosive gastropathy with no bleeding and no stigmata of recent bleeding, non bleeding duodenal ulcers  Reviewed I/O's: +645 ml x 24 hours and +2.8 L since admission  Per oncology notes, malignancy be progressing rapidly. Started on R-CHOP on 03/07/24.  Spoke with pt at bedside, who was pleasant and in good spirits today. Pt reports feeling happy about improved overall condition. He shares that he does not have a great appetite at baseline, but has been trying to consume a lot of liquids and protein. Pt shares that his thrush has resolved and that painful swallowing has improved. Pt shares that he still has some difficulty with large pills and tougher foods. He admits that he can be a selective eater; discussed continuing regular diet vs downgrading diet for ease of intake. Pt desires to continue regular diet for wider variety of meal selections so he can pick and choose what he is able to eat.   Reviewed wt hx; no wt loss noted over the past month. Pt with moderate  edema, which may be masking true weight loss as well as fat and muscle depletions.   Discussed importance of good meal and supplement intake to promote healing. Pt admits to being able to drink more than eat and is amenable to supplements. He would like a high protein shake; discussed importance of receiving adequate calories and protein, especially to help obtain adequate nutrition. Pt amenable to Ensure, stating he drinks 1-2 per day at home.   Medications reviewed and include diflucan, prednisone, and remeron.   Lab Results  Component Value Date   HGBA1C 6.0 (H) 08/28/2023   PTA DM medications are none.   Labs reviewed: Na: 131, CBGS: 109 (inpatient orders for glycemic control are none).    NUTRITION - FOCUSED PHYSICAL EXAM:  Flowsheet Row Most Recent Value  Orbital Region No depletion  Upper Arm Region No depletion  Thoracic and Lumbar Region No depletion  Buccal Region No depletion  Temple Region No depletion  Clavicle Bone Region No depletion  Clavicle and Acromion Bone Region No depletion  Scapular Bone Region No depletion  Dorsal Hand No depletion  Patellar Region No depletion  Anterior Thigh Region No depletion  Posterior Calf Region No depletion  Edema (RD Assessment) Moderate  Hair Reviewed  Eyes Reviewed  Mouth Reviewed  Skin Reviewed  Nails Reviewed       Diet Order:   Diet Order             Diet regular Fluid consistency: Thin  Diet effective now  EDUCATION NEEDS:   Education needs have been addressed  Skin:  Skin Assessment: Skin Integrity Issues: Skin Integrity Issues:: Incisions Incisions: closed lt axilla  Last BM:  03/10/24 (type 7)  Height:   Ht Readings from Last 1 Encounters:  03/04/24 5\' 10"  (1.778 m)    Weight:   Wt Readings from Last 1 Encounters:  03/09/24 110 kg    Ideal Body Weight:  75.5 kg  BMI:  Body mass index is 34.8 kg/m.  Estimated Nutritional Needs:   Kcal:  2050-2250  Protein:   100-115 grams  Fluid:  2.0-2.2 L    Levada Schilling, RD, LDN, CDCES Registered Dietitian III Certified Diabetes Care and Education Specialist If unable to reach this RD, please use "RD Inpatient" group chat on secure chat between hours of 8am-4 pm daily

## 2024-03-10 NOTE — Plan of Care (Signed)

## 2024-03-10 NOTE — Progress Notes (Signed)
 Occupational Therapy Treatment Patient Details Name: Jesse Norton MRN: [id number] DOB: Jul 08, 1946 Today's Date: 03/10/2024   History of present illness Pt is a 78 y.o. male  who presents to the ED due to melena. Admitted for management of upper GI bleed, anemia, AKI. 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   OT comments  Pt is seated in recliner on arrival with wife present. Reports his hbg was slightly lower today so they are ordering another transfusion, but he still feels like he can engage in a light therapy session. He continues to have minimal back pain with standing. Pt performed STS from recliner with SUP to RW, mild lightheadedness that did not worsen with activity. Ambulated to the sink at the door to perform standing grooming tasks with CGA prior to returning to recliner. Provided theraband and edu on strengthening exercises and encouraged seated exercises while in recliner to prevent further weakness/decline. Pt appreciative. Pt left in recliner with all needs in place and will cont to require skilled acute OT services to maximize his safety and IND to return to PLOF.       If plan is discharge home, recommend the following:  A little help with walking and/or transfers;A lot of help with bathing/dressing/bathroom;Assistance with cooking/housework;Assist for transportation;Help with stairs or ramp for entrance   Equipment Recommendations  BSC/3in1    Recommendations for Other Services      Precautions / Restrictions Precautions Precautions: Fall Recall of Precautions/Restrictions: Intact Restrictions Weight Bearing Restrictions Per Provider Order: No       Mobility Bed Mobility               General bed mobility comments: up in recliner pre/post session    Transfers Overall transfer level: Needs assistance Equipment used:  Rolling walker (2 wheels) Transfers: Sit to/from Stand Sit to Stand: Supervision           General transfer comment: SUP for STS from recliner to RW and CGA/SBA for mobility to the sink/door and back     Balance Overall balance assessment: Needs assistance Sitting-balance support: Bilateral upper extremity supported, Feet supported Sitting balance-Leahy Scale: Good     Standing balance support: Bilateral upper extremity supported Standing balance-Leahy Scale: Good Standing balance comment: BUE support on sink counter d/t back pain during standing grooming tasks at sink                           ADL either performed or assessed with clinical judgement   ADL Overall ADL's : Needs assistance/impaired     Grooming: Wash/dry hands;Oral care;Wash/dry face;Standing;Contact guard assist Grooming Details (indicate cue type and reason): at sink/counter with BUE support d/t back pain                             Functional mobility during ADLs: Contact guard assist;Supervision/safety;Rolling walker (2 wheels)      Extremity/Trunk Assessment              Vision       Perception     Praxis     Communication Communication Communication: No apparent difficulties   Cognition Arousal: Alert Behavior During Therapy: WFL for tasks assessed/performed Cognition: No apparent impairments  Following commands: Intact        Cueing      Exercises Other Exercises Other Exercises: Provided theraband and educated on performing seated exercises as tolerable throughout the day to prevent further decline/weakness.    Shoulder Instructions       General Comments generalized weakness and fatigue with lower Hgb today    Pertinent Vitals/ Pain       Pain Assessment Pain Assessment: Faces Faces Pain Scale: Hurts a little bit Pain Location: back Pain Descriptors / Indicators: Aching Pain Intervention(s): Monitored  during session, Repositioned  Home Living                                          Prior Functioning/Environment              Frequency  Min 2X/week        Progress Toward Goals  OT Goals(current goals can now be found in the care plan section)  Progress towards OT goals: Progressing toward goals  Acute Rehab OT Goals Patient Stated Goal: return home OT Goal Formulation: With patient/family Time For Goal Achievement: 03/19/24 Potential to Achieve Goals: Good  Plan      Co-evaluation                 AM-PAC OT "6 Clicks" Daily Activity     Outcome Measure   Help from another person eating meals?: None Help from another person taking care of personal grooming?: None Help from another person toileting, which includes using toliet, bedpan, or urinal?: A Little Help from another person bathing (including washing, rinsing, drying)?: A Lot Help from another person to put on and taking off regular upper body clothing?: None Help from another person to put on and taking off regular lower body clothing?: A Lot 6 Click Score: 19    End of Session Equipment Utilized During Treatment: Rolling walker (2 wheels)  OT Visit Diagnosis: Other abnormalities of gait and mobility (R26.89);Unsteadiness on feet (R26.81)   Activity Tolerance Patient tolerated treatment well   Patient Left in chair;with call bell/phone within reach;with family/visitor present   Nurse Communication Mobility status        Time: 6578-4696 OT Time Calculation (min): 19 min  Charges: OT General Charges $OT Visit: 1 Visit OT Treatments $Self Care/Home Management : 8-22 mins  Corynne Scibilia, OTR/L  03/10/24, 10:50 AM   Constance Goltz 03/10/2024, 10:47 AM

## 2024-03-10 NOTE — Care Management Important Message (Signed)
 Important Message  Patient Details  Name: Jesse Norton MRN: [id number] Date of Birth: 11/20/1946   Important Message Given:  Yes - Medicare IM     Cristela Blue, CMA 03/10/2024, 11:37 AM

## 2024-03-10 NOTE — Progress Notes (Signed)
 PROGRESS NOTE Jesse Norton    DOB: 03-Feb-1946, 78 y.o.  SWF:[id number]    Code Status: Full Code   DOA: 03/04/2024   LOS: 6   Brief hospital course  Jesse Norton is a 78 y.o. 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 Of note, patient was recently admitted for atrial fibrillation with RVR, new diagnosis and started on amiodarone, metoprolol, and Eliquis.  During this admission, he was also treated for CAP with azithromycin and ceftriaxone.   ED course: 112/59 with heart rate of 71.  He was saturating at 93% on room air.  He was afebrile at 97.8.  Initial workup notable for hemoglobin of 6.5, MCV of 110, creatinine 1.44, sodium 129, AST 50, GFR 49.  INR 1.6.  2 units of packed RBCs ordered and patient started on Protonix.   3/26- EGD without active bleed, positive esophageal candidal infection.  3/28: chemo treatment completed and tolerated well presently-hgb roughly stable but with gradual decline so receiving another blood transfusion 3/31. Restarted plavix due to h/o DES. Continue PT evaluation for goal to go home in next couple days Experiencing weakness/nausea in post-chemo setting.   Assessment & Plan  Principal Problem:   Upper GI bleed Active Problems:   Symptomatic anemia   AKI (acute kidney injury) (HCC)   Leg edema, right   Paroxysmal atrial fibrillation (HCC)   Chronic heart failure with preserved ejection fraction (HCC)   DLBCL (diffuse large B cell lymphoma) (HCC)   Coronary artery disease of native artery of native heart with stable angina pectoris (HCC)   Esophageal lesion   Melena   Postpyloric ulcer  Upper GI bleed- relating to recent eliquis initiation for afib Symptomatic anemia- symptoms improving. Partial bleed and lymphoma  contributing. Hgb 6.5>>8.6>8.8>8.5>7.8 s/p transfusions prior history of gastric ulcer and Barrett's esophagus.   White plaque on lower esophagus, gastric antrum erosions, duodenal ulcers. None actively bleeding. No biopsies were collected. Found on EGD 3/26. - negative H. Pylori - continue hold eliquis - s/p 3 units pRBCs - heme/onc following -CBC am  Hypotension- difficult to assess true blood pressure as patient has significant edema of bilateral upper extremities. Due not think his true pressures are assessed accurately so will need to monitor for symptoms of hypotension which is also difficult due to overlapping symptoms from his chemo treatment/cancer/anemia.  - received 1L bolus, blood transfusion, started on midodrine.  - decreased home metoprolol  Candidal esophagitis- brushings obtained in EGD positive for yeast.  - diflucan completed - GI consulted; appreciate their recommendations - advance to regular diet - Continue Protonix 40 mg twice daily  DLBCL (diffuse large B cell lymphoma) (HCC)-  - Oncology consulted; appreciate their recommendations.Chemo 3/28.  - UE Doppler was negative for DVT. Positive for lymphadenopathy.  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  - restart plavix after discussing risks/benefits with patient    AKI- resolved. Normal baseline. Cr 1.44>1.2  Depression- chronic and in acute grief currently - continue home prozac - trial of remeron for sleep, appetite, depression treatment - requested chaplain consult    Paroxysmal atrial fibrillation (HCC)-  Currently in sinus rhythm - Continue amiodarone and metoprolol -  Hold home Eliquis.   Chronic heart failure with preserved ejection fraction (HCC) Patient appears hypervolemic on examination - continue home diuretics now that AKI completed - Strict in and out - Daily  weights   Hyponatremia- mild.  - monitor periodically  Body mass index is 34.8 kg/m.  VTE ppx: SCDs Start: 03/04/24 1739  Diet:     Diet   Diet regular Fluid consistency: Thin   Consultants: GI Heme/onc  Subjective 03/10/24    Pt reports feeling good today. Was depressed yesterday but better today after getting up and brushing his teeth. Looking forward to going home.    Objective   Vitals:   03/09/24 0800 03/09/24 1600 03/09/24 1947 03/10/24 0232  BP: 118/61 (!) 87/61 (!) 97/50 (!) 129/57  Pulse: 75 73 78 81  Resp: 18 17 19 17   Temp: 97.7 F (36.5 C) 97.7 F (36.5 C) 97.7 F (36.5 C) 97.8 F (36.6 C)  TempSrc: Oral Oral Oral Oral  SpO2: 98% 95% 95% 98%  Weight:      Height:       No intake or output data in the 24 hours ending 03/10/24 0717   Filed Weights   03/07/24 0455 03/08/24 0356 03/09/24 0500  Weight: 111.4 kg 110.4 kg 110 kg    Physical Exam:  General: awake, alert, NAD HEENT: atraumatic, clear conjunctiva, anicteric sclera, MMM, hearing grossly normal Respiratory: normal respiratory effort. CTAB. Healing incision on R chest.  Cardiovascular: quick capillary refill, normal S1/S2, RRR, no JVD, murmurs Nervous: A&O x3. no gross focal neurologic deficits, normal speech Extremities: moves all equally, non pitting edema, normal tone. Left arm continues to be edematous and wife describes drainage but there is no drainage on my exam Skin: dry, intact, normal temperature, normal color. No rashes, lesions or ulcers on exposed skin Psychiatry: normal mood, congruent affect  Labs   I have personally reviewed the following labs and imaging studies CBC    Component Value Date/Time   WBC 11.3 (H) 03/10/2024 0510   RBC 2.12 (L) 03/10/2024 0510   HGB 7.3 (L) 03/10/2024 0510   HGB 9.0 (L) 03/05/2024 0446   HCT 21.5 (L) 03/10/2024 0510   HCT 38.9 01/26/2023 0946   PLT 261 03/10/2024 0510   PLT 386 03/04/2024 1230   PLT 322 01/26/2023 0946   MCV 101.4 (H)  03/10/2024 0510   MCV 107 (H) 01/26/2023 0946   MCV 95 01/06/2014 1007   MCH 34.4 (H) 03/10/2024 0510   MCHC 34.0 03/10/2024 0510   RDW 20.0 (H) 03/10/2024 0510   RDW 12.0 01/26/2023 0946   RDW 13.6 01/06/2014 1007   LYMPHSABS 0.6 (L) 03/04/2024 1529   LYMPHSABS 2.4 01/26/2023 0946   MONOABS 0.7 03/04/2024 1529   EOSABS 0.1 03/04/2024 1529   EOSABS 0.5 (H) 01/26/2023 0946   BASOSABS 0.1 03/04/2024 1529   BASOSABS 0.2 01/26/2023 0946      Latest Ref Rng & Units 03/09/2024    6:05 AM 03/06/2024    4:34 AM 03/05/2024    4:46 AM  BMP  Glucose 70 - 99 mg/dL 85  84  99   BUN 8 - 23 mg/dL 43  29  33   Creatinine 0.61 - 1.24 mg/dL 5.40  9.81  1.91   Sodium 135 - 145 mmol/L 131  131  131   Potassium 3.5 - 5.1 mmol/L 3.9  4.3  4.2   Chloride 98 - 111 mmol/L 97  96  97   CO2 22 -  32 mmol/L 24  25  24    Calcium 8.9 - 10.3 mg/dL 8.4  8.7  9.0     No results found.   Disposition Plan & Communication  Patient status: Inpatient  Admitted From: Home Planned disposition location: Home Anticipated discharge date: 4/2 pending safe dispo. Very physically deconditioned  Family Communication: wife at bedside    Author: Leeroy Bock, DO [facility] 03/10/2024, 7:17 AM   Available by Epic secure chat 7AM-7PM. If 7PM-7AM, please contact night-coverage.  [facility] contact information found on [email or web address].

## 2024-03-10 NOTE — Progress Notes (Signed)
 Please add hold tube- on 4/4 appointment; and add zarxio;   possible  d-3- 1 unit of blood.   GB

## 2024-03-10 NOTE — Progress Notes (Signed)
   03/10/24 0900  Spiritual Encounters  Type of Visit Initial  Care provided to: Pt and family  Conversation partners present during encounter Nurse  Referral source Patient request  Reason for visit Routine spiritual support  OnCall Visit No  Spiritual Framework  Presenting Themes Goals in life/care;Meaning/purpose/sources of inspiration;Courage hope and growth;Impactful experiences and emotions;Rituals and practive  Patient Stress Factors Health changes  Interventions  Spiritual Care Interventions Made Established relationship of care and support;Compassionate presence;Reflective listening;Encouragement  Intervention Outcomes  Outcomes Connection to spiritual care;Awareness around self/spiritual resourses;Awareness of health;Awareness of support   Chaplain received a spiritual consult for prayer for the patient. The patient shared with the chaplain about his cancer diagnosis and his concerns and fear about it. Patients wife shared that she is trying to remain strong for the patient and that they are on this journey together. Patient also shared with the chaplain that he would like a bedside commode so that he can try to start going to the bathroom by himself. Chaplain spoke with the patients nurse about the request and she said that she will assist the patient with this. Chaplain prayed with patient and his wife and offered them words of encouragement. Chaplain will follow up with patient to check on his progress.

## 2024-03-11 ENCOUNTER — Inpatient Hospital Stay

## 2024-03-11 DIAGNOSIS — K922 Gastrointestinal hemorrhage, unspecified: Secondary | ICD-10-CM | POA: Diagnosis not present

## 2024-03-11 DIAGNOSIS — C8330 Diffuse large B-cell lymphoma, unspecified site: Secondary | ICD-10-CM | POA: Diagnosis not present

## 2024-03-11 LAB — BASIC METABOLIC PANEL WITH GFR
Anion gap: 8 (ref 5–15)
BUN: 37 mg/dL — ABNORMAL HIGH (ref 8–23)
CO2: 25 mmol/L (ref 22–32)
Calcium: 8.4 mg/dL — ABNORMAL LOW (ref 8.9–10.3)
Chloride: 102 mmol/L (ref 98–111)
Creatinine, Ser: 1.07 mg/dL (ref 0.61–1.24)
GFR, Estimated: >60 mL/min (ref >60)
Glucose, Bld: 105 mg/dL — ABNORMAL HIGH (ref 70–99)
Potassium: 3.8 mmol/L (ref 3.5–5.1)
Sodium: 135 mmol/L (ref 135–145)

## 2024-03-11 LAB — CBC
HCT: 23.3 % — ABNORMAL LOW (ref 39.0–52.0)
Hemoglobin: 7.9 g/dL — ABNORMAL LOW (ref 13.0–17.0)
MCH: 34.3 pg — ABNORMAL HIGH (ref 26.0–34.0)
MCHC: 33.9 g/dL (ref 30.0–36.0)
MCV: 101.3 fL — ABNORMAL HIGH (ref 80.0–100.0)
Platelets: 205 10*3/uL (ref 150–400)
RBC: 2.3 MIL/uL — ABNORMAL LOW (ref 4.22–5.81)
RDW: 21.6 % — ABNORMAL HIGH (ref 11.5–15.5)
WBC: 4.1 10*3/uL (ref 4.0–10.5)
nRBC: 0.5 % — ABNORMAL HIGH (ref 0.0–0.2)

## 2024-03-11 LAB — LACTATE DEHYDROGENASE: LDH: 530 U/L — ABNORMAL HIGH (ref 98–192)

## 2024-03-11 NOTE — Progress Notes (Signed)
 Physical Therapy Treatment Patient Details Name: Jesse Norton MRN: [id number] DOB: 06-03-1946 Today's Date: 03/11/2024   History of Present Illness Pt is a 78 y.o. male  who presents to the ED due to melena. Admitted for management of upper GI bleed, anemia, AKI. 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    PT Comments  Pt is making good progress towards goals with ability to ambulate in hallway this date using RW. Does fatigue with exertion and needs cues for pacing and energy conservation. Pt agreeable to another session for stairs training later this week. Will continue to progress.    If plan is discharge home, recommend the following: A little help with walking and/or transfers;A little help with bathing/dressing/bathroom;Help with stairs or ramp for entrance;Assist for transportation   Can travel by private vehicle        Equipment Recommendations  Wheelchair (measurements PT);Wheelchair cushion (measurements PT);Rolling walker (2 wheels);BSC/3in1    Recommendations for Other Services       Precautions / Restrictions Precautions Precautions: Fall Recall of Precautions/Restrictions: Intact Restrictions Weight Bearing Restrictions Per Provider Order: No     Mobility  Bed Mobility Overal bed mobility: Needs Assistance Bed Mobility: Supine to Sit     Supine to sit: Supervision     General bed mobility comments: ease of mobility with ability to sit at EOB without assistance    Transfers Overall transfer level: Needs assistance Equipment used: Rolling walker (2 wheels) Transfers: Sit to/from Stand Sit to Stand: Supervision           General transfer comment: safe technique with upright posture    Ambulation/Gait Ambulation/Gait assistance: Supervision Gait Distance (Feet): 70 Feet Assistive device: Rolling walker (2  wheels) Gait Pattern/deviations: Step-through pattern       General Gait Details: able to ambulate in hallway with 1 standing rest break due to fatigue. Very slow gait speed and cues for breathing due to increased WOB. HR at 108bpm with exertion   Stairs             Wheelchair Mobility     Tilt Bed    Modified Rankin (Stroke Patients Only)       Balance Overall balance assessment: Needs assistance Sitting-balance support: Bilateral upper extremity supported, Feet supported Sitting balance-Leahy Scale: Good     Standing balance support: Bilateral upper extremity supported Standing balance-Leahy Scale: Good                              Communication Communication Communication: No apparent difficulties  Cognition Arousal: Alert Behavior During Therapy: WFL for tasks assessed/performed   PT - Cognitive impairments: No apparent impairments                       PT - Cognition Comments: pleasant and agreeable to session Following commands: Intact      Cueing Cueing Techniques: Verbal cues  Exercises Other Exercises Other Exercises: reviewed theraband exercises on yellow band in addition to reviewing energy conservation strategies.    General Comments        Pertinent Vitals/Pain Pain Assessment Pain Assessment: No/denies pain    Home Living                          Prior Function  PT Goals (current goals can now be found in the care plan section) Acute Rehab PT Goals Patient Stated Goal: to go home PT Goal Formulation: With patient Time For Goal Achievement: 03/19/24 Potential to Achieve Goals: Good Progress towards PT goals: Progressing toward goals    Frequency    Min 2X/week      PT Plan      Co-evaluation              AM-PAC PT "6 Clicks" Mobility   Outcome Measure  Help needed turning from your back to your side while in a flat bed without using bedrails?: None Help needed  moving from lying on your back to sitting on the side of a flat bed without using bedrails?: A Little Help needed moving to and from a bed to a chair (including a wheelchair)?: A Little Help needed standing up from a chair using your arms (e.g., wheelchair or bedside chair)?: A Little Help needed to walk in hospital room?: A Little Help needed climbing 3-5 steps with a railing? : A Little 6 Click Score: 19    End of Session   Activity Tolerance: Patient tolerated treatment well Patient left: in chair;with call bell/phone within reach Nurse Communication: Mobility status PT Visit Diagnosis: Muscle weakness (generalized) (M62.81);Difficulty in walking, not elsewhere classified (R26.2);Unsteadiness on feet (R26.81)     Time: 6045-4098 PT Time Calculation (min) (ACUTE ONLY): 14 min  Charges:    $Gait Training: 8-22 mins PT General Charges $$ ACUTE PT VISIT: 1 Visit                     Elizabeth Palau, PT, DPT, GCS [phone]    Jaira Canady 03/11/2024, 9:11 AM

## 2024-03-11 NOTE — Progress Notes (Signed)
 PROGRESS NOTE Jesse Norton    DOB: 1946-01-08, 78 y.o.  QIO:[id number]    Code Status: Full Code   DOA: 03/04/2024   LOS: 7   Brief hospital course  Jesse Norton is a 78 y.o. 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 Of note, patient was recently admitted for atrial fibrillation with RVR, new diagnosis and started on amiodarone, metoprolol, and Eliquis.  During this admission, he was also treated for CAP with azithromycin and ceftriaxone.   ED course: 112/59 with heart rate of 71.  He was saturating at 93% on room air.  He was afebrile at 97.8.  Initial workup notable for hemoglobin of 6.5, MCV of 110, creatinine 1.44, sodium 129, AST 50, GFR 49.  INR 1.6.  2 units of packed RBCs ordered and patient started on Protonix.   3/26- EGD without active bleed, positive esophageal candidal infection.  3/28: chemo treatment completed and tolerated well presently-hgb roughly stable but with gradual decline so receiving another blood transfusion 3/31. Restarted plavix due to h/o DES. Continue PT evaluation for goal to go home in next couple days Experiencing weakness/nausea in post-chemo setting but feels better today.  Possible home 4/2 or 4/3 pending work with PT  Assessment & Plan  Principal Problem:   Upper GI bleed Active Problems:   Symptomatic anemia   AKI (acute kidney injury) (HCC)   Leg edema, right   Paroxysmal atrial fibrillation (HCC)   Chronic heart failure with preserved ejection fraction (HCC)   DLBCL (diffuse large B cell lymphoma) (HCC)   Coronary artery disease of native artery of native heart with stable angina pectoris (HCC)   Esophageal lesion   Melena   Postpyloric ulcer  Upper GI bleed- relating to recent eliquis initiation for  afib Symptomatic anemia- symptoms improving. Partial bleed and lymphoma contributing. Hgb 6.5>>>7.9 s/p transfusions prior history of gastric ulcer and Barrett's esophagus.  EGD showed 3/26: gastric antrum erosions, duodenal ulcers. None actively bleeding. No biopsies were collected.  - negative H. Pylori - continue hold eliquis - s/p 3 units pRBCs - heme/onc following -CBC am  Physical deconditioning- working diligently with PT with goal to go home soon. Needs to work on stairs which they plan to do tomorrow.  - PT/OT, recommending HH  Hypotension- difficult to assess true blood pressure as patient has significant edema of bilateral upper extremities. Do not think his true pressures are assessed accurately so will need to monitor for symptoms of hypotension which is also difficult due to overlapping symptoms from his chemo treatment/cancer/anemia.  - received 1L bolus, blood transfusion, started on midodrine. Tolerating well. - decreased home metoprolol  Candidal esophagitis- brushings obtained in EGD positive for yeast.  - diflucan completed - GI consulted; appreciate their recommendations - advance to regular diet - Continue Protonix 40 mg twice daily  DLBCL (diffuse large B cell lymphoma) (HCC)-  - Oncology consulted; appreciate their recommendations.Chemo 3/28.  - UE Doppler was negative for DVT. Positive for lymphadenopathy.  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  - restart plavix after discussing risks/benefits with patient    AKI- resolved. Normal baseline. Cr 1.44>1.2  Depression- chronic and in  acute grief currently - continue home prozac - trial of remeron for sleep, appetite, depression treatment and patient is having a good response. Will continue - requested chaplain consult    Paroxysmal atrial fibrillation (HCC)-  Currently in sinus  rhythm - Continue amiodarone and metoprolol - Hold home Eliquis.   Chronic heart failure with preserved ejection fraction (HCC) Patient appears hypervolemic on examination - continue home diuretics now that AKI completed - Strict in and out - Daily weights   Hyponatremia- mild.  - monitor periodically  Body mass index is 35.89 kg/m.  VTE ppx: SCDs Start: 03/04/24 1739  Diet:     Diet   Diet regular Fluid consistency: Thin   Consultants: GI Heme/onc  Subjective 03/11/24    Pt reports doing well today. Has had increased activity with PT and is hopfeul to work on stairs tomorrow.    Objective   Vitals:   03/10/24 1453 03/10/24 2027 03/11/24 0249 03/11/24 0350  BP: (!) 94/56 126/69 112/66   Pulse: 72 73 70   Resp: 18 18 20    Temp: 98 F (36.7 C) 98.7 F (37.1 C) (!) 96.9 F (36.1 C)   TempSrc:      SpO2: 96% 97% 96%   Weight:    113.4 kg  Height:        Intake/Output Summary (Last 24 hours) at 03/11/2024 0733 Last data filed at 03/10/2024 2137 Gross per 24 hour  Intake 882 ml  Output --  Net 882 ml     Filed Weights   03/08/24 0356 03/09/24 0500 03/11/24 0350  Weight: 110.4 kg 110 kg 113.4 kg    Physical Exam:  General: awake, alert, NAD HEENT: atraumatic, clear conjunctiva, anicteric sclera, MMM, hearing grossly normal Respiratory: normal respiratory effort. CTAB. Healing incision on R chest.  Cardiovascular: quick capillary refill, normal S1/S2, RRR, no JVD, murmurs Nervous: A&O x3. no gross focal neurologic deficits, normal speech Extremities: moves all equally, non pitting edema, normal tone. Left arm continues to be edematous but much improved Skin: dry, intact, normal temperature, normal color. No rashes, lesions or ulcers on exposed skin Psychiatry: normal mood, congruent affect  Labs   I have personally reviewed the following labs and imaging studies CBC    Component Value Date/Time   WBC 4.1 03/11/2024 0642   RBC 2.30 (L) 03/11/2024 0642    HGB 7.9 (L) 03/11/2024 0642   HGB 9.0 (L) 03/05/2024 0446   HCT 23.3 (L) 03/11/2024 0642   HCT 38.9 01/26/2023 0946   PLT 205 03/11/2024 0642   PLT 386 03/04/2024 1230   PLT 322 01/26/2023 0946   MCV 101.3 (H) 03/11/2024 0642   MCV 107 (H) 01/26/2023 0946   MCV 95 01/06/2014 1007   MCH 34.3 (H) 03/11/2024 0642   MCHC 33.9 03/11/2024 0642   RDW 21.6 (H) 03/11/2024 0642   RDW 12.0 01/26/2023 0946   RDW 13.6 01/06/2014 1007   LYMPHSABS 0.6 (L) 03/04/2024 1529   LYMPHSABS 2.4 01/26/2023 0946   MONOABS 0.7 03/04/2024 1529   EOSABS 0.1 03/04/2024 1529   EOSABS 0.5 (H) 01/26/2023 0946   BASOSABS 0.1 03/04/2024 1529   BASOSABS 0.2 01/26/2023 0946      Latest Ref Rng & Units 03/11/2024    6:42 AM 03/09/2024    6:05 AM 03/06/2024    4:34 AM  BMP  Glucose 70 - 99 mg/dL 161  85  84   BUN 8 - 23 mg/dL 37  43  29   Creatinine  0.61 - 1.24 mg/dL 1.61  0.96  0.45   Sodium 135 - 145 mmol/L 135  131  131   Potassium 3.5 - 5.1 mmol/L 3.8  3.9  4.3   Chloride 98 - 111 mmol/L 102  97  96   CO2 22 - 32 mmol/L 25  24  25    Calcium 8.9 - 10.3 mg/dL 8.4  8.4  8.7     No results found.   Disposition Plan & Communication  Patient status: Inpatient  Admitted From: Home Planned disposition location: Home Anticipated discharge date: 4/2 pending safe dispo. Very physically deconditioned  Family Communication: wife at bedside    Author: Leeroy Bock, DO [facility] 03/11/2024, 7:33 AM   Available by Epic secure chat 7AM-7PM. If 7PM-7AM, please contact night-coverage.  [facility] contact information found on [email or web address].

## 2024-03-11 NOTE — Plan of Care (Signed)

## 2024-03-11 NOTE — Progress Notes (Signed)
 Jesse Norton   DOB:1946/06/10   VW#:[id number]    Subjective: No acute events overnight.  Patient resting comfortably in the chair. No nausea or vomiting.  Patient states he noticed improvement of the leg swelling.  Continues to have bilateral arm swelling.  He is currently working with physical therapy.  Able to walk few feet in the hallways.  As per the wife noted to have some cough at nighttime.  Per wife from improvement of the neck adenopathy.  Patient admits to dark-colored stool but no blood.  However this seems to be improving.  Objective:  Vitals:   03/11/24 0249 03/11/24 0824  BP: 112/66 (!) 112/52  Pulse: 70 80  Resp: 20 18  Temp: (!) 96.9 F (36.1 C) 97.8 F (36.6 C)  SpO2: 96% 95%     Intake/Output Summary (Last 24 hours) at 03/11/2024 1430 Last data filed at 03/11/2024 1054 Gross per 24 hour  Intake 237 ml  Output --  Net 237 ml    Bilateral upper lower extremity swelling.  Generalized neck adenopathy and axillary adenopathy.  Physical Exam Vitals and nursing note reviewed.  HENT:     Head: Normocephalic and atraumatic.     Mouth/Throat:     Pharynx: Oropharynx is clear.  Eyes:     Extraocular Movements: Extraocular movements intact.     Pupils: Pupils are equal, round, and reactive to light.  Cardiovascular:     Rate and Rhythm: Normal rate and regular rhythm.  Pulmonary:     Comments: Decreased breath sounds bilaterally.  Abdominal:     Palpations: Abdomen is soft.  Musculoskeletal:        General: Normal range of motion.     Cervical back: Normal range of motion.     Right lower leg: Edema present.     Left lower leg: Edema present.  Skin:    General: Skin is warm.  Neurological:     General: No focal deficit present.     Mental Status: He is alert and oriented to person, place, and time.  Psychiatric:        Behavior: Behavior normal.        Judgment: Judgment normal.      Labs:  Lab Results  Component Value Date   WBC 4.1 03/11/2024   HGB  7.9 (L) 03/11/2024   HCT 23.3 (L) 03/11/2024   MCV 101.3 (H) 03/11/2024   PLT 205 03/11/2024   NEUTROABS 6.9 03/04/2024    Lab Results  Component Value Date   NA 135 03/11/2024   K 3.8 03/11/2024   CL 102 03/11/2024   CO2 25 03/11/2024    Studies:  No results found.   78 year old male patient history of atrial fibrillation on Eliquis, diffuse large B-cell lymphoma , HFpEF, CAD s/p PCI with DES to left circumflex and LAD,COPD, who presents to the ED due to melena.   # Diffuse large B-cell lymphoma-EBV associated-non-GCB subtype currently status post 1 treatment of rituximab approximately 2 weeks ago.  Clinically noted to have progression of disease based on lymphadenopathy and also rising LDH. #1  R-CHOP chemo on 3/28. 3/27 started prednisone 50 mg a day x 10 days.  Clinically stable. Continue G-CSF daily x7 days.   # Severe anemia hemoglobin 6.5-on admission suspect secondary to GI bleed- on eliquis/and also underlying lymphoma. haptoglobin-WNL- .  S/p EGD- on PPI-agree with blood transfusion to keep hemoglobin above 8. Stable.   # Bilateral extremity upper extremity edema-likely third spacing/lymphatic blockage on the  left side- bilateral upper extremity Doppler-negative for DVT-if worsens consider low-dose of Lasix.    # A-fib-continue metoprolol/and amiodarone.  Currently-Eliquis on hold..stable.     # Prophylaxis: TLS- on allopurinol; ID- on Acyclovir.  Monitor closely for tumor lysis.stable.     Earna Coder, MD 03/11/2024  2:30 PM

## 2024-03-11 NOTE — Plan of Care (Signed)
 Received report. Assessment completed, wife present. Pharmacy notified about missing injection due at 1800.

## 2024-03-11 DEATH — deceased

## 2024-03-12 ENCOUNTER — Ambulatory Visit: Admitting: Internal Medicine

## 2024-03-12 ENCOUNTER — Other Ambulatory Visit

## 2024-03-12 ENCOUNTER — Ambulatory Visit

## 2024-03-12 DIAGNOSIS — D649 Anemia, unspecified: Secondary | ICD-10-CM | POA: Diagnosis not present

## 2024-03-12 DIAGNOSIS — K922 Gastrointestinal hemorrhage, unspecified: Secondary | ICD-10-CM | POA: Diagnosis not present

## 2024-03-12 DIAGNOSIS — I480 Paroxysmal atrial fibrillation: Secondary | ICD-10-CM | POA: Diagnosis not present

## 2024-03-12 DIAGNOSIS — N179 Acute kidney failure, unspecified: Secondary | ICD-10-CM | POA: Diagnosis not present

## 2024-03-12 LAB — CBC WITH DIFFERENTIAL/PLATELET
Abs Immature Granulocytes: 0.5 10*3/uL — ABNORMAL HIGH (ref 0.00–0.07)
Basophils Absolute: 0 10*3/uL (ref 0.0–0.1)
Basophils Relative: 1 %
Eosinophils Absolute: 0 10*3/uL (ref 0.0–0.5)
Eosinophils Relative: 2 %
HCT: 19.1 % — ABNORMAL LOW (ref 39.0–52.0)
Hemoglobin: 6.3 g/dL — ABNORMAL LOW (ref 13.0–17.0)
Immature Granulocytes: 40 %
Lymphocytes Relative: 14 %
Lymphs Abs: 0.2 10*3/uL — ABNORMAL LOW (ref 0.7–4.0)
MCH: 34.6 pg — ABNORMAL HIGH (ref 26.0–34.0)
MCHC: 33 g/dL (ref 30.0–36.0)
MCV: 104.9 fL — ABNORMAL HIGH (ref 80.0–100.0)
Monocytes Absolute: 0 10*3/uL — ABNORMAL LOW (ref 0.1–1.0)
Monocytes Relative: 2 %
Neutro Abs: 0.5 10*3/uL — ABNORMAL LOW (ref 1.7–7.7)
Neutrophils Relative %: 41 %
Platelets: 148 10*3/uL — ABNORMAL LOW (ref 150–400)
RBC: 1.82 MIL/uL — ABNORMAL LOW (ref 4.22–5.81)
RDW: 20.7 % — ABNORMAL HIGH (ref 11.5–15.5)
Smear Review: NORMAL
WBC: 1.2 10*3/uL — CL (ref 4.0–10.5)
nRBC: 0 % (ref 0.0–0.2)

## 2024-03-12 LAB — COMPREHENSIVE METABOLIC PANEL WITH GFR
ALT: 16 U/L (ref 0–44)
AST: 25 U/L (ref 15–41)
Albumin: 2.1 g/dL — ABNORMAL LOW (ref 3.5–5.0)
Alkaline Phosphatase: 46 U/L (ref 38–126)
Anion gap: 8 (ref 5–15)
BUN: 40 mg/dL — ABNORMAL HIGH (ref 8–23)
CO2: 25 mmol/L (ref 22–32)
Calcium: 8.2 mg/dL — ABNORMAL LOW (ref 8.9–10.3)
Chloride: 102 mmol/L (ref 98–111)
Creatinine, Ser: 1.02 mg/dL (ref 0.61–1.24)
GFR, Estimated: >60 mL/min (ref >60)
Glucose, Bld: 192 mg/dL — ABNORMAL HIGH (ref 70–99)
Potassium: 3.9 mmol/L (ref 3.5–5.1)
Sodium: 135 mmol/L (ref 135–145)
Total Bilirubin: 0.4 mg/dL (ref 0.0–1.2)
Total Protein: 4.4 g/dL — ABNORMAL LOW (ref 6.5–8.1)

## 2024-03-12 LAB — LACTATE DEHYDROGENASE: LDH: 416 U/L — ABNORMAL HIGH (ref 98–192)

## 2024-03-12 LAB — PREPARE RBC (CROSSMATCH)

## 2024-03-12 LAB — URIC ACID: Uric Acid, Serum: 3.5 mg/dL — ABNORMAL LOW (ref 3.7–8.6)

## 2024-03-12 LAB — PATHOLOGIST SMEAR REVIEW

## 2024-03-12 MED ORDER — GUAIFENESIN-DM 100-10 MG/5ML PO SYRP
5.0000 mL | ORAL_SOLUTION | ORAL | Status: DC | PRN
  Administered 2024-03-12 – 2024-03-13 (×3): 5 mL via ORAL
  Filled 2024-03-12 (×3): qty 10

## 2024-03-12 MED ORDER — SODIUM CHLORIDE 0.9% IV SOLUTION: Freq: Once | INTRAVENOUS | Status: AC

## 2024-03-12 NOTE — Progress Notes (Signed)
 Occupational Therapy Treatment Patient Details Name: Jesse Norton MRN: [id number] DOB: [birth year] Today's Date: 03/12/2024   History of present illness Pt is a 78 y.o. male  who presents to the ED due to melena. Admitted for management of upper GI bleed, anemia, AKI. 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   OT comments  Pt is supine in bed on arrival. Easily arousable and agreeable to OT session. He denies pain. Pt performed bed mobility with MOD I. Pt required CGA for STS from EOB and SPT to recliner. Able to stand to don long sleeve robe with CGA/SBA. Seated grooming in recliner with set up assist. MD came in to assess/talk with pt, pt left with all needs in place and will cont to require skilled acute OT services to maximize his safety and IND to return to PLOF.       If plan is discharge home, recommend the following:  A little help with walking and/or transfers;A lot of help with bathing/dressing/bathroom;Assistance with cooking/housework;Assist for transportation;Help with stairs or ramp for entrance   Equipment Recommendations  BSC/3in1    Recommendations for Other Services      Precautions / Restrictions Precautions Precautions: Fall Recall of Precautions/Restrictions: Intact Restrictions Weight Bearing Restrictions Per Provider Order: No       Mobility Bed Mobility Overal bed mobility: Modified Independent             General bed mobility comments: MOD I with HOB slightly elevated    Transfers Overall transfer level: Needs assistance Equipment used: Rolling walker (2 wheels) Transfers: Sit to/from Stand Sit to Stand: Contact guard assist     Step pivot transfers: Contact guard assist     General transfer comment: CGA for transfers during session using RW     Balance Overall balance assessment: Needs  assistance Sitting-balance support: Bilateral upper extremity supported, Feet supported Sitting balance-Leahy Scale: Good     Standing balance support: Bilateral upper extremity supported, Reliant on assistive device for balance Standing balance-Leahy Scale: Good                             ADL either performed or assessed with clinical judgement   ADL Overall ADL's : Needs assistance/impaired     Grooming: Wash/dry face;Sitting;Set up Grooming Details (indicate cue type and reason): able to wash face at recliner level         Upper Body Dressing : Standing;Supervision/safety Upper Body Dressing Details (indicate cue type and reason): to don long sleeve robe     Toilet Transfer: Contact guard assist;Supervision/safety;Rolling walker (2 wheels);Ambulation Toilet Transfer Details (indicate cue type and reason): simulated to recliner                Extremity/Trunk Assessment              Vision       Perception     Praxis     Communication Communication Communication: No apparent difficulties   Cognition Arousal: Alert Behavior During Therapy: WFL for tasks assessed/performed Cognition: No apparent impairments                               Following commands: Intact        Cueing   Cueing Techniques: Verbal cues  Exercises  Shoulder Instructions       General Comments      Pertinent Vitals/ Pain       Pain Assessment Pain Assessment: No/denies pain Pain Intervention(s): Monitored during session  Home Living                                          Prior Functioning/Environment              Frequency  Min 2X/week        Progress Toward Goals  OT Goals(current goals can now be found in the care plan section)  Progress towards OT goals: Progressing toward goals  Acute Rehab OT Goals Patient Stated Goal: return home OT Goal Formulation: With patient Time For Goal Achievement:  03/19/24 Potential to Achieve Goals: Good  Plan      Co-evaluation                 AM-PAC OT "6 Clicks" Daily Activity     Outcome Measure   Help from another person eating meals?: None Help from another person taking care of personal grooming?: None Help from another person toileting, which includes using toliet, bedpan, or urinal?: A Little Help from another person bathing (including washing, rinsing, drying)?: A Lot Help from another person to put on and taking off regular upper body clothing?: None Help from another person to put on and taking off regular lower body clothing?: A Lot 6 Click Score: 19    End of Session Equipment Utilized During Treatment: Rolling walker (2 wheels)  OT Visit Diagnosis: Other abnormalities of gait and mobility (R26.89);Unsteadiness on feet (R26.81)   Activity Tolerance Patient tolerated treatment well   Patient Left in chair;with call bell/phone within reach   Nurse Communication Mobility status        Time: 1128-1140 OT Time Calculation (min): 12 min  Charges: OT General Charges $OT Visit: 1 Visit OT Treatments $Therapeutic Activity: 8-22 mins  Havanna Groner, OTR/L  03/12/24, 1:56 PM   Aubrynn Katona E Mykala Mccready 03/12/2024, 1:55 PM

## 2024-03-12 NOTE — Progress Notes (Signed)
 Physical Therapy Treatment Patient Details Name: Jesse Norton MRN: [id number] DOB: November 17, 1946 Today's Date: 03/12/2024   History of Present Illness Pt is a 78 y.o. male  who presents to the ED due to melena. Admitted for management of upper GI bleed, anemia, AKI. 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    PT Comments  Pt is making gradual progress towards goals with ability to navigate 16 stairs this date with HEAVY effort, light headedness and quick fatigue. Needed seated rest break after every 4 steps. Currently not safe to perform without direct physical assist. Pt wanting information on EMS transition home when medically stabilized. Pt continues to be very motivated and willing to participate.     If plan is discharge home, recommend the following: A little help with walking and/or transfers;A little help with bathing/dressing/bathroom;Help with stairs or ramp for entrance;Assist for transportation   Can travel by private vehicle        Equipment Recommendations  Rolling walker (2 wheels);BSC/3in1    Recommendations for Other Services       Precautions / Restrictions Precautions Precautions: Fall Recall of Precautions/Restrictions: Intact Restrictions Weight Bearing Restrictions Per Provider Order: No     Mobility  Bed Mobility               General bed mobility comments: NT, received seated in recliner    Transfers Overall transfer level: Needs assistance Equipment used: 1 person hand held assist Transfers: Sit to/from Stand Sit to Stand: Contact guard assist           General transfer comment: safe technique with upright posture    Ambulation/Gait               General Gait Details: focused session on stair training this date   Stairs Stairs: Yes Stairs assistance: Contact guard assist Stair Management: One  rail Left, Step to pattern, Sideways Number of Stairs: 16 General stair comments: pt able to ascend 4 stairs at a time with seated rest break in between. Becomes light headed with exertion. HR ranges from 119-139bpm with stair training. Very effortful and needed hands on assist   Wheelchair Mobility     Tilt Bed    Modified Rankin (Stroke Patients Only)       Balance Overall balance assessment: Needs assistance Sitting-balance support: Bilateral upper extremity supported, Feet supported Sitting balance-Leahy Scale: Good     Standing balance support: Bilateral upper extremity supported Standing balance-Leahy Scale: Good                              Communication Communication Communication: No apparent difficulties  Cognition Arousal: Alert Behavior During Therapy: WFL for tasks assessed/performed   PT - Cognitive impairments: No apparent impairments                       PT - Cognition Comments: pleasant and agreeable to session Following commands: Intact      Cueing Cueing Techniques: Verbal cues  Exercises      General Comments        Pertinent Vitals/Pain Pain Assessment Pain Assessment: No/denies pain    Home Living                          Prior Function  PT Goals (current goals can now be found in the care plan section) Acute Rehab PT Goals Patient Stated Goal: to go home PT Goal Formulation: With patient Time For Goal Achievement: 03/19/24 Potential to Achieve Goals: Good Progress towards PT goals: Progressing toward goals    Frequency    Min 2X/week      PT Plan      Co-evaluation              AM-PAC PT "6 Clicks" Mobility   Outcome Measure  Help needed turning from your back to your side while in a flat bed without using bedrails?: None Help needed moving from lying on your back to sitting on the side of a flat bed without using bedrails?: A Little Help needed moving to and from a  bed to a chair (including a wheelchair)?: A Little Help needed standing up from a chair using your arms (e.g., wheelchair or bedside chair)?: A Little Help needed to walk in hospital room?: A Little Help needed climbing 3-5 steps with a railing? : A Lot 6 Click Score: 18    End of Session   Activity Tolerance: Patient tolerated treatment well Patient left: in chair;with call bell/phone within reach Nurse Communication: Mobility status PT Visit Diagnosis: Muscle weakness (generalized) (M62.81);Difficulty in walking, not elsewhere classified (R26.2);Unsteadiness on feet (R26.81)     Time: 5188-4166 PT Time Calculation (min) (ACUTE ONLY): 32 min  Charges:    $Gait Training: 23-37 mins PT General Charges $$ ACUTE PT VISIT: 1 Visit                     Elizabeth Palau, PT, DPT, GCS [phone]    Aquan Kope 03/12/2024, 1:39 PM

## 2024-03-12 NOTE — Progress Notes (Signed)
   03/12/24 1400  Spiritual Encounters  Type of Visit Follow up  Care provided to: Patient  Conversation partners present during encounter Nurse  Referral source Chaplain assessment  Reason for visit Routine spiritual support  OnCall Visit No  Spiritual Framework  Presenting Themes Meaning/purpose/sources of inspiration;Goals in life/care;Impactful experiences and emotions;Courage hope and growth   Chaplain went to follow up with patient to check on his progress. Patient is feeling very hopeful and said that his faith is very strong. Patient told me about experiences that he's had that he said were miracles. Patient is worried about his wife and wants to make sure she is getting enough rest and taking time for herself. Chaplain will continue to follow up with patient.

## 2024-03-12 NOTE — Plan of Care (Signed)
 Received report, family present, asking for pain med with bedtime meds. Assessment completed. 2120 messaged pharmacy re: missing Nivestym 2245 Messaged pharmacy re: missing med. 2303 messaged pharmacy re: missing med. Informed someone needs to go to pharmacy and pick it up.  0200 Sleeping when checked. 0515 Up to BR, reports nauseated, given meds. Returned to bed.  Slept most of night.

## 2024-03-12 NOTE — Progress Notes (Signed)
 Jesse Norton   DOB:[birth year]   ZO#:[id number]    Subjective: No acute events overnight.  Patient resting comfortably in the chair. No nausea or vomiting.  Patient states he noticed improvement of the leg swelling/  bilateral arm swelling.    He is currently working with physical therapy.  Able to walk few feet in the hallways. Had again episode of cough at nighttime. Patient admits to little blood in stool.  However this seems to be improving.  Objective:  Vitals:   03/12/24 0737 03/12/24 0947  BP: (!) 116/54   Pulse: 85   Resp: 17 18  Temp: 98.6 F (37 C)   SpO2: 95%      Intake/Output Summary (Last 24 hours) at 03/12/2024 1240 Last data filed at 03/12/2024 1027 Gross per 24 hour  Intake 760 ml  Output --  Net 760 ml    Bilateral upper lower extremity swelling.  Generalized neck adenopathy and axillary adenopathy.  Physical Exam Vitals and nursing note reviewed.  HENT:     Head: Normocephalic and atraumatic.     Mouth/Throat:     Pharynx: Oropharynx is clear.  Eyes:     Extraocular Movements: Extraocular movements intact.     Pupils: Pupils are equal, round, and reactive to light.  Cardiovascular:     Rate and Rhythm: Normal rate and regular rhythm.  Pulmonary:     Comments: Decreased breath sounds bilaterally.  Abdominal:     Palpations: Abdomen is soft.  Musculoskeletal:        General: Normal range of motion.     Cervical back: Normal range of motion.     Right lower leg: Edema present.     Left lower leg: Edema present.  Skin:    General: Skin is warm.  Neurological:     General: No focal deficit present.     Mental Status: He is alert and oriented to person, place, and time.  Psychiatric:        Behavior: Behavior normal.        Judgment: Judgment normal.      Labs:  Lab Results  Component Value Date   WBC 1.2 (LL) 03/12/2024   HGB 6.3 (L) 03/12/2024   HCT 19.1 (L) 03/12/2024   MCV 104.9 (H) 03/12/2024   PLT 148 (L) 03/12/2024   NEUTROABS 0.5 (L)  03/12/2024    Lab Results  Component Value Date   NA 135 03/12/2024   K 3.9 03/12/2024   CL 102 03/12/2024   CO2 25 03/12/2024    Studies:  No results found.   78 year old male patient history of atrial fibrillation on Eliquis, diffuse large B-cell lymphoma , HFpEF, CAD s/p PCI with DES to left circumflex and LAD,COPD, who presents to the ED due to melena.   # Diffuse large B-cell lymphoma-EBV associated-non-GCB subtype currently status post 1 treatment of rituximab approximately 2 weeks ago.  Clinically noted to have progression of disease based on lymphadenopathy and also rising LDH. #1  R-CHOP chemo on 3/28. 3/27 started prednisone 50 mg a day x 10 days.  Clinically stable. Continue G-CSF daily x7 days.   # Severe anemia hemoglobin 6.5-on admission suspect secondary to GI bleed- on eliquis/and also underlying lymphoma. haptoglobin-WNL- .  S/p EGD- on PPI-agree with blood transfusion to keep hemoglobin above 8. Today- Hb 6.8- proceed with 2 units of blood.    # Bilateral extremity upper extremity edema-likely third spacing/lymphatic blockage on the left side- bilateral upper extremity Doppler-negative for DVT-if worsens  consider low-dose of Lasix. Stable.    # A-fib-continue metoprolol/and amiodarone.  Currently-Eliquis on hold. Stable.    # Prophylaxis: TLS- on allopurinol; ID- on Acyclovir.  Monitor closely for tumor lysis. Stable.   # added incentive spirometry- discussed with Dr.Sreeram.    Earna Coder, MD 03/12/2024  12:40 PM

## 2024-03-12 NOTE — Progress Notes (Signed)
 Progress Note   Patient: Jesse Norton XBM:[id number] DOB: 10/12/1946 DOA: 03/04/2024     8 DOS: the patient was seen and examined on 03/12/2024   Brief hospital course: ADIT RIDDLES is a 78 y.o. 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 Of note, patient was recently admitted for atrial fibrillation with RVR, new diagnosis and started on amiodarone, metoprolol, and Eliquis.  During this admission, he was also treated for CAP with azithromycin and ceftriaxone.   ED course: 112/59 with heart rate of 71.  He was saturating at 93% on room air.  He was afebrile at 97.8.  Initial workup notable for hemoglobin of 6.5, MCV of 110, creatinine 1.44, sodium 129, AST 50, GFR 49.  INR 1.6.  2 units of packed RBCs ordered and patient started on Protonix.    3/26- EGD without active bleed, positive esophageal candidal infection.  3/28: chemo treatment completed and tolerated well presently-hgb roughly stable but with gradual decline so receiving another blood transfusion 3/31. Restarted plavix due to h/o DES. Continue PT evaluation for goal to go home in next couple days  Assessment and Plan: Upper GI bleed- relating to recent eliquis initiation for afib Symptomatic anemia- symptoms improving. Partial bleed and lymphoma contributing. Prior history of gastric ulcer and Barrett's esophagus.  EGD showed 3/26: gastric antrum erosions, duodenal ulcers. None actively bleeding. No biopsies were collected.  Negative H. Pylori Continue hold eliquis S/p 3 units pRBCs Heme/onc following Today Hb 6.3. transfusion of 1PRBC ordered. Goal Hb >8   Physical deconditioning- working diligently with PT with goal to go home soon. Needs to work on stairs which they plan to do tomorrow.  PT/OT,  recommending HH   Hypotension- difficult to assess true blood pressure as patient has significant edema of bilateral upper extremities. Do not think his true pressures are assessed accurately so will need to monitor for symptoms of hypotension which is also difficult due to overlapping symptoms from his chemo treatment/cancer/anemia.  Received 1L bolus, blood transfusion, now on midodrine. Tolerating well. decreased home metoprolol   Candidal esophagitis- brushings obtained in EGD positive for yeast.  Diflucan completed GI consulted; appreciate their recommendations Advance to regular diet Continue Protonix 40 mg twice daily   DLBCL (diffuse large B cell lymphoma) [facility])-  Oncology consulted; appreciate their recommendations.Chemo 3/28.  UE Doppler was negative for DVT. Positive for lymphadenopathy.  arm compression sleeve and elevation. Leukopenia expected per oncology. Further management per onc orders.   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  Resume Plavix after discussing risks/benefits with patient    AKI- resolved. Normal baseline. Cr 1.44>1.2   Depression- chronic and in acute grief currently Continue home prozac - trial of remeron for sleep, appetite, depression treatment and patient is having a good response.     Paroxysmal atrial fibrillation (HCC)-  Currently in sinus rhythm Continue amiodarone and metoprolol  Hold home Eliquis.   Chronic heart failure with preserved ejection fraction (HCC) Patient appears hypervolemic on examination continue home diuretics now that AKI completed Strict in and out Daily weights   Hyponatremia- mild.  Improved.       Out of bed to chair. Incentive spirometry. Nursing supportive care. Fall, aspiration precautions. Diet:  Diet Orders (From admission,  onward)     Start     Ordered   03/08/24 1502  Diet regular Fluid consistency: Thin  Diet effective now       Comments:  Plastic utensils only please  Question:  Fluid consistency:  Answer:  Thin   03/08/24 1501           DVT prophylaxis: SCDs Start: 03/04/24 1739  Level of care: Telemetry Medical   Code Status: Full Code  Subjective: Patient is seen and examined today morning. He is sitting in chair. Feels better than yesterday, worked with PT. hemoglobin came back 6.3.  He denies active bleeding.  Physical Exam: Vitals:   03/12/24 1426 03/12/24 1441 03/12/24 1500 03/12/24 1714  BP: 129/66 126/66 119/69 120/70  Pulse: 100 95 92 93  Resp: 18 18 19 18   Temp: 98.1 F (36.7 C) 97.6 F (36.4 C) 97.7 F (36.5 C) 97.9 F (36.6 C)  TempSrc: Oral Oral Oral Oral  SpO2: 95% 95% 94% 96%  Weight:      Height:        General - Elderly ill Caucasian male, sitting, no apparent distress HEENT - PERRLA, EOMI, atraumatic head, non tender sinuses. Lung - Clear, basal rales, rhonchi, no wheezes. Heart - S1, S2 heard, no murmurs, rubs, trace pedal edema. Abdomen - Soft, non tender, bowel sounds good Neuro - Alert, awake and oriented x 3, non focal exam. Skin - Warm and dry.  Data Reviewed:      Latest Ref Rng & Units 03/12/2024   10:25 AM 03/11/2024    6:42 AM 03/10/2024    5:10 AM  CBC  WBC 4.0 - 10.5 K/uL 1.2  4.1  11.3   Hemoglobin 13.0 - 17.0 g/dL 6.3  7.9  7.3   Hematocrit 39.0 - 52.0 % 19.1  23.3  21.5   Platelets 150 - 400 K/uL 148  205  261       Latest Ref Rng & Units 03/12/2024   10:20 AM 03/11/2024    6:42 AM 03/09/2024    6:05 AM  BMP  Glucose 70 - 99 mg/dL 161  096  85   BUN 8 - 23 mg/dL 40  37  43   Creatinine 0.61 - 1.24 mg/dL 0.45  4.09  8.11   Sodium 135 - 145 mmol/L 135  135  131   Potassium 3.5 - 5.1 mmol/L 3.9  3.8  3.9   Chloride 98 - 111 mmol/L 102  102  97   CO2 22 - 32 mmol/L 25  25  24    Calcium 8.9 - 10.3 mg/dL 8.2  8.4  8.4    No results found.  Family Communication: Discussed with patient, he understand and agree. All questions answered.  Disposition: Status is:  Inpatient Remains inpatient appropriate because: anemia, need transfusion, weakness  Planned Discharge Destination: Home with Home Health     Time spent: 40 minutes  Author: Marcelino Duster, MD 03/12/2024 6:28 PM Secure chat 7am to 7pm For on call review [email or web address].

## 2024-03-13 ENCOUNTER — Ambulatory Visit

## 2024-03-13 ENCOUNTER — Encounter

## 2024-03-13 ENCOUNTER — Inpatient Hospital Stay

## 2024-03-13 DIAGNOSIS — D649 Anemia, unspecified: Secondary | ICD-10-CM | POA: Diagnosis not present

## 2024-03-13 DIAGNOSIS — C8333 Diffuse large B-cell lymphoma, intra-abdominal lymph nodes: Secondary | ICD-10-CM | POA: Diagnosis not present

## 2024-03-13 DIAGNOSIS — K922 Gastrointestinal hemorrhage, unspecified: Secondary | ICD-10-CM | POA: Diagnosis not present

## 2024-03-13 DIAGNOSIS — I480 Paroxysmal atrial fibrillation: Secondary | ICD-10-CM | POA: Diagnosis not present

## 2024-03-13 DIAGNOSIS — N179 Acute kidney failure, unspecified: Secondary | ICD-10-CM | POA: Diagnosis not present

## 2024-03-13 LAB — CBC WITH DIFFERENTIAL/PLATELET
Abs Immature Granulocytes: 0 10*3/uL (ref 0.00–0.07)
Basophils Absolute: 0 10*3/uL (ref 0.0–0.1)
Basophils Relative: 15 %
Eosinophils Absolute: 0 10*3/uL (ref 0.0–0.5)
Eosinophils Relative: 0 %
HCT: 18.7 % — ABNORMAL LOW (ref 39.0–52.0)
Hemoglobin: 6.4 g/dL — ABNORMAL LOW (ref 13.0–17.0)
Immature Granulocytes: 0 %
Lymphocytes Relative: 71 %
Lymphs Abs: 0.1 10*3/uL — ABNORMAL LOW (ref 0.7–4.0)
MCH: 33.3 pg (ref 26.0–34.0)
MCHC: 34.2 g/dL (ref 30.0–36.0)
MCV: 97.4 fL (ref 80.0–100.0)
Monocytes Absolute: 0 10*3/uL — ABNORMAL LOW (ref 0.1–1.0)
Monocytes Relative: 0 %
Neutro Abs: 0 10*3/uL — CL (ref 1.7–7.7)
Neutrophils Relative %: 14 %
Platelets: 76 10*3/uL — ABNORMAL LOW (ref 150–400)
RBC: 1.92 MIL/uL — ABNORMAL LOW (ref 4.22–5.81)
RDW: 19.1 % — ABNORMAL HIGH (ref 11.5–15.5)
Smear Review: NORMAL
WBC: <0.1 10*3/uL — CL (ref 4.0–10.5)
nRBC: 0 % (ref 0.0–0.2)

## 2024-03-13 LAB — COMPREHENSIVE METABOLIC PANEL WITH GFR
ALT: 18 U/L (ref 0–44)
ALT: 20 U/L (ref 0–44)
AST: 35 U/L (ref 15–41)
AST: 32 U/L (ref 15–41)
Albumin: 2.2 g/dL — ABNORMAL LOW (ref 3.5–5.0)
Albumin: 2 g/dL — ABNORMAL LOW (ref 3.5–5.0)
Alkaline Phosphatase: 49 U/L (ref 38–126)
Alkaline Phosphatase: 42 U/L (ref 38–126)
Anion gap: 12 (ref 5–15)
Anion gap: 11 (ref 5–15)
BUN: 40 mg/dL — ABNORMAL HIGH (ref 8–23)
BUN: 46 mg/dL — ABNORMAL HIGH (ref 8–23)
CO2: 22 mmol/L (ref 22–32)
CO2: 22 mmol/L (ref 22–32)
Calcium: 8.8 mg/dL — ABNORMAL LOW (ref 8.9–10.3)
Calcium: 8.3 mg/dL — ABNORMAL LOW (ref 8.9–10.3)
Chloride: 107 mmol/L (ref 98–111)
Chloride: 104 mmol/L (ref 98–111)
Creatinine, Ser: 1.2 mg/dL (ref 0.61–1.24)
Creatinine, Ser: 1.29 mg/dL — ABNORMAL HIGH (ref 0.61–1.24)
GFR, Estimated: >60 mL/min (ref >60)
GFR, Estimated: 56 mL/min — ABNORMAL LOW (ref >60)
Glucose, Bld: 196 mg/dL — ABNORMAL HIGH (ref 70–99)
Glucose, Bld: 156 mg/dL — ABNORMAL HIGH (ref 70–99)
Potassium: 5.4 mmol/L — ABNORMAL HIGH (ref 3.5–5.1)
Potassium: 4.1 mmol/L (ref 3.5–5.1)
Sodium: 141 mmol/L (ref 135–145)
Sodium: 137 mmol/L (ref 135–145)
Total Bilirubin: 0.9 mg/dL (ref 0.0–1.2)
Total Bilirubin: 0.6 mg/dL (ref 0.0–1.2)
Total Protein: 4.9 g/dL — ABNORMAL LOW (ref 6.5–8.1)
Total Protein: 4.3 g/dL — ABNORMAL LOW (ref 6.5–8.1)

## 2024-03-13 LAB — CBC
HCT: 21.5 % — ABNORMAL LOW (ref 39.0–52.0)
Hemoglobin: 7 g/dL — ABNORMAL LOW (ref 13.0–17.0)
MCH: 33 pg (ref 26.0–34.0)
MCHC: 32.6 g/dL (ref 30.0–36.0)
MCV: 101.4 fL — ABNORMAL HIGH (ref 80.0–100.0)
Platelets: 120 10*3/uL — ABNORMAL LOW (ref 150–400)
RBC: 2.12 MIL/uL — ABNORMAL LOW (ref 4.22–5.81)
RDW: 21 % — ABNORMAL HIGH (ref 11.5–15.5)
WBC: 0.8 10*3/uL — CL (ref 4.0–10.5)
nRBC: 0 % (ref 0.0–0.2)

## 2024-03-13 LAB — BLOOD GAS, ARTERIAL
Acid-base deficit: 1.5 mmol/L (ref 0.0–2.0)
Bicarbonate: 21.1 mmol/L (ref 20.0–28.0)
O2 Saturation: 99.2 %
Patient temperature: 37
pCO2 arterial: 29 mmHg — ABNORMAL LOW (ref 32–48)
pH, Arterial: 7.47 — ABNORMAL HIGH (ref 7.35–7.45)
pO2, Arterial: 108 mmHg (ref 83–108)

## 2024-03-13 LAB — PHOSPHORUS: Phosphorus: 1.8 mg/dL — ABNORMAL LOW (ref 2.5–4.6)

## 2024-03-13 LAB — URIC ACID: Uric Acid, Serum: 3.3 mg/dL — ABNORMAL LOW (ref 3.7–8.6)

## 2024-03-13 LAB — PREPARE RBC (CROSSMATCH)

## 2024-03-13 LAB — GLUCOSE, CAPILLARY: Glucose-Capillary: 156 mg/dL — ABNORMAL HIGH (ref 70–99)

## 2024-03-13 LAB — BRAIN NATRIURETIC PEPTIDE: B Natriuretic Peptide: 104.1 pg/mL — ABNORMAL HIGH (ref 0.0–100.0)

## 2024-03-13 LAB — LACTATE DEHYDROGENASE: LDH: 412 U/L — ABNORMAL HIGH (ref 98–192)

## 2024-03-13 LAB — MAGNESIUM: Magnesium: 1.6 mg/dL — ABNORMAL LOW (ref 1.7–2.4)

## 2024-03-13 MED ORDER — LACTATED RINGERS IV BOLUS
500.0000 mL | Freq: Once | INTRAVENOUS | Status: AC
  Administered 2024-03-13: 500 mL via INTRAVENOUS

## 2024-03-13 MED ORDER — SODIUM CHLORIDE 0.9% IV SOLUTION: Freq: Once | INTRAVENOUS | Status: AC

## 2024-03-13 MED ORDER — ALBUMIN HUMAN 25 % IV SOLN
25.0000 g | INTRAVENOUS | Status: AC
  Administered 2024-03-13: 25 g via INTRAVENOUS
  Filled 2024-03-13 (×2): qty 100

## 2024-03-13 MED ORDER — FUROSEMIDE 20 MG PO TABS
40.0000 mg | ORAL_TABLET | Freq: Every day | ORAL | Status: DC
  Administered 2024-03-13: 40 mg via ORAL
  Filled 2024-03-13: qty 1

## 2024-03-13 MED ORDER — PANTOPRAZOLE SODIUM 40 MG IV SOLR
40.0000 mg | Freq: Two times a day (BID) | INTRAVENOUS | Status: DC
  Administered 2024-03-14 (×2): 40 mg via INTRAVENOUS
  Filled 2024-03-13 (×2): qty 10

## 2024-03-13 NOTE — TOC Progression Note (Signed)
 Transition of Care [facility]) - Progression Note    Patient Details  Name: Jesse Norton MRN: [id number] Date of Birth: 03-16-1946  Transition of Care [facility]) CM/SW Contact  Chapman Fitch, RN Phone Number: 03/13/2024, 3:32 PM  Clinical Narrative:    Per MD patient not medically stable for discharge today RW has already been delivered to room 3/28.  Since delivery signature was obtained over 48 hours ago TOC to notify [employer] closer to discharge to obtain another signature  Plan remains for [facility] home [facility] at discharge        Expected Discharge Plan and Services                                               Social Determinants of Health (SDOH) Interventions SDOH Screenings   Food Insecurity: No Food Insecurity (03/04/2024)  Housing: Low Risk  (03/04/2024)  Transportation Needs: No Transportation Needs (03/04/2024)  Utilities: Not At Risk (03/04/2024)  Alcohol Screen: Low Risk  (07/17/2023)  Depression (PHQ2-9): High Risk (02/26/2024)  Financial Resource Strain: Low Risk  (01/29/2024)   Received from [facility]  Physical Activity: Insufficiently Active (07/17/2023)  Social Connections: Moderately Integrated (03/04/2024)  Stress: No Stress Concern Present (07/17/2023)  Tobacco Use: Low Risk  (03/04/2024)  Health Literacy: Adequate Health Literacy (07/17/2023)    Readmission Risk Interventions    03/07/2024   10:43 AM  Readmission Risk Prevention Plan  Transportation Screening Complete  HRI or Home Care Consult Complete  SW Recovery Care/Counseling Consult Complete  Skilled Nursing Facility Patient Refused

## 2024-03-13 NOTE — Progress Notes (Signed)
 Progress Note   Patient: Jesse Norton:[id number] DOB: 1946/11/20 DOA: 03/04/2024     9 DOS: the patient was seen and examined on 03/13/2024   Brief hospital course: Jesse Norton is a 78 y.o. 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 Of note, patient was recently admitted for atrial fibrillation with RVR, new diagnosis and started on amiodarone, metoprolol, and Eliquis.  During this admission, he was also treated for CAP with azithromycin and ceftriaxone.   ED course: 112/59 with heart rate of 71.  He was saturating at 93% on room air.  He was afebrile at 97.8.  Initial workup notable for hemoglobin of 6.5, MCV of 110, creatinine 1.44, sodium 129, AST 50, GFR 49.  INR 1.6.  2 units of packed RBCs ordered and patient started on Protonix.    3/26- EGD without active bleed, positive esophageal candidal infection.  3/28: chemo treatment completed and tolerated well presently-hgb roughly stable but with gradual decline so receiving another blood transfusion 3/31. Restarted plavix due to h/o DES. Continue PT evaluation for goal to go home in next couple days. 4/3 Hb 7 after transfusion 4/2. He had large dark bloody bowel movement. Stopped Plavix.   Assessment and Plan: Upper GI bleed- relating to recent eliquis initiation for afib Symptomatic anemia- symptoms improving. Partial bleed and lymphoma contributing. Prior history of gastric ulcer and Barrett's esophagus.  EGD showed 3/26: gastric antrum erosions, duodenal ulcers. None actively bleeding. No biopsies were collected.  Negative H. Pylori Continue hold eliquis S/p 4 units pRBCs Heme/onc following Today Hb 7.0. transfusion of 1 PRBC ordered. Goal Hb >8. He has large bloody bowel movement 4/3. Will recheck CBC  and transfuse accordingly.   Hypotension-  BP lower side. Continue on midodrine. Tolerating well. Decreased home metoprolol   Candidal esophagitis- brushings obtained in EGD positive for yeast.  Diflucan completed. GI input appreciated. Advance to regular diet. Continue Protonix 40 mg twice daily.   DLBCL (diffuse large B cell lymphoma) [facility])-  Oncology recommendations appreciated. Chemo 3/28.  UE Doppler was negative for DVT. Positive for lymphadenopathy.  arm compression sleeve and elevation. Leukopenia expected per oncology. Further management per onc orders.   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  Stopped Plavix given GI bleed, low hemoglobin.    AKI- resolved. Normal baseline. Cr 1.44>1.2   Depression- chronic and in acute grief currently Continue home prozac Continue remeron for sleep, appetite, depression.    Paroxysmal atrial fibrillation (HCC)-  Currently in sinus rhythm Continue amiodarone and metoprolol Hold home Eliquis.   Chronic heart failure with preserved ejection fraction (HCC) Patient appears hypervolemic on examination Resume home dose Lasix now that AKI resolved. Strict in and out. Daily weights   Hyponatremia- mild.  Improved.  Physical deconditioning-  Weak due to his anemia, lymphoma.  PT/OT, recommending HH. He planS to go [facility] with [facility]     Out of bed to chair. Incentive spirometry. Nursing supportive care. Fall, aspiration precautions. Diet:  Diet Orders (From admission, onward)     Start     Ordered   03/08/24 1502  Diet regular Fluid consistency: Thin  Diet effective now       Comments: Plastic utensils only please  Question:  Fluid consistency:  Answer:  Thin   03/08/24 1501           DVT prophylaxis: SCDs Start: 03/04/24 1739  Level of care: Telemetry Medical   Code Status: Full Code  Subjective: Patient is seen and examined today morning. He is weak, lying in  bed. Hemoglobin 7 today. 1PRBC requested. Eating poor.   Physical Exam: Vitals:   03/13/24 0801 03/13/24 1056 03/13/24 1115 03/13/24 1406  BP: (!) 107/54 (!) 101/59 112/68 107/63  Pulse: (!) 122 (!) 120 (!) 109 (!) 113  Resp: (!) 24 (!) 22 20 20   Temp: 98.7 F (37.1 C) 98.5 F (36.9 C) 98.6 F (37 C) 98.2 F (36.8 C)  TempSrc: Oral  Oral   SpO2: 91% 96% 92%   Weight:      Height:        General - Elderly ill Caucasian male, sitting, no apparent distress HEENT - PERRLA, EOMI, atraumatic head, non tender sinuses. Lung - Clear, basal rales, rhonchi, no wheezes. Heart - S1, S2 heard, no murmurs, rubs, 1+ pedal edema, left arm swelling. Abdomen - Soft, non tender, bowel sounds good Neuro - Alert, awake and oriented x 3, non focal exam. Skin - Warm and dry.  Data Reviewed:      Latest Ref Rng & Units 03/13/2024    6:02 AM 03/12/2024   10:25 AM 03/11/2024    6:42 AM  CBC  WBC 4.0 - 10.5 K/uL 0.8  1.2  4.1   Hemoglobin 13.0 - 17.0 g/dL 7.0  6.3  7.9   Hematocrit 39.0 - 52.0 % 21.5  19.1  23.3   Platelets 150 - 400 K/uL 120  148  205       Latest Ref Rng & Units 03/13/2024    6:02 AM 03/12/2024   10:20 AM 03/11/2024    6:42 AM  BMP  Glucose 70 - 99 mg/dL 782  956  213   BUN 8 - 23 mg/dL 40  40  37   Creatinine 0.61 - 1.24 mg/dL 0.86  5.78  4.69   Sodium 135 - 145 mmol/L 141  135  135   Potassium 3.5 - 5.1 mmol/L 5.4  3.9  3.8   Chloride 98 - 111 mmol/L 107  102  102   CO2 22 - 32 mmol/L 22  25  25    Calcium 8.9 - 10.3 mg/dL 8.8  8.2  8.4    No results found.  Family Communication: Discussed with patient, he understand and agree. All questions answered.  Disposition: Status is: Inpatient Remains inpatient appropriate because: anemia, need transfusion, weakness  Planned Discharge Destination: Home with Home Health     Time spent: 39 minutes  Author: Marcelino Duster, MD 03/13/2024 2:21 PM Secure chat 7am to 7pm For on call review [email or web address].

## 2024-03-13 NOTE — Progress Notes (Signed)
 SHANNA STRENGTH   DOB:19-May-1946   JJ#:[id number]    Subjective: No acute events overnight.  Patient resting in bed. Positive for nausea. No vomiting.  Patient states he noticed improvement of the leg swelling/  bilateral arm swelling.   C/o dizzyness. Mild cough. Patient admits to no blood in stool, but appeared black.    Objective:  Vitals:   03/13/24 0329 03/13/24 0801  BP: (!) 112/59 (!) 107/54  Pulse: 98 (!) 122  Resp: 18 (!) 24  Temp: 97.6 F (36.4 C) 98.7 F (37.1 C)  SpO2: 93% 91%     Intake/Output Summary (Last 24 hours) at 03/13/2024 0817 Last data filed at 03/13/2024 0500 Gross per 24 hour  Intake 1116 ml  Output --  Net 1116 ml    Bilateral upper lower extremity swelling.  Generalized neck adenopathy and axillary adenopathy.  Physical Exam Vitals and nursing note reviewed.  HENT:     Head: Normocephalic and atraumatic.     Mouth/Throat:     Pharynx: Oropharynx is clear.  Eyes:     Extraocular Movements: Extraocular movements intact.     Pupils: Pupils are equal, round, and reactive to light.  Cardiovascular:     Rate and Rhythm: Normal rate and regular rhythm.  Pulmonary:     Comments: Decreased breath sounds bilaterally.  Abdominal:     Palpations: Abdomen is soft.  Musculoskeletal:        General: Normal range of motion.     Cervical back: Normal range of motion.     Right lower leg: Edema present.     Left lower leg: Edema present.  Skin:    General: Skin is warm.  Neurological:     General: No focal deficit present.     Mental Status: He is alert and oriented to person, place, and time.  Psychiatric:        Behavior: Behavior normal.        Judgment: Judgment normal.      Labs:  Lab Results  Component Value Date   WBC 0.8 (LL) 03/13/2024   HGB 7.0 (L) 03/13/2024   HCT 21.5 (L) 03/13/2024   MCV 101.4 (H) 03/13/2024   PLT 120 (L) 03/13/2024   NEUTROABS 0.5 (L) 03/12/2024    Lab Results  Component Value Date   NA 141 03/13/2024   K 5.4  (H) 03/13/2024   CL 107 03/13/2024   CO2 22 03/13/2024    Studies:  No results found.   78 year old male patient history of atrial fibrillation on Eliquis, diffuse large B-cell lymphoma , HFpEF, CAD s/p PCI with DES to left circumflex and LAD,COPD, who presents to the ED due to melena.   # Diffuse large B-cell lymphoma-EBV associated-non-GCB subtype currently status post 1 treatment of rituximab approximately 2 weeks ago.  Clinically noted to have progression of disease based on lymphadenopathy and also rising LDH. #1  R-CHOP chemo on 3/28. 3/27 started prednisone 50 mg a day x 10 days.  Clinically stable. Continue G-CSF daily x7 days.   # Severe anemia hemoglobin 6.5-on admission suspect secondary to GI bleed- on eliquis/and also underlying lymphoma. haptoglobin-WNL- .  S/p EGD- on PPI-agree with blood transfusion to keep hemoglobin above 8. Today- Hb 6.8- proceed with PRBD transfusion.   # Bilateral extremity upper extremity edema-likely third spacing/lymphatic blockage on the left side- bilateral upper extremity Doppler-negative for DVT-if worsens consider low-dose of Lasix. Stable.    # CAD -A-fib-continue metoprolol/and amiodarone.  Currently-Eliquis on hold.  Given  the ongoing drop in hemoglobin discussed with hospitalist service discontinue Plavix.   # Prophylaxis: TLS- on allopurinol; ID- on Acyclovir.  Monitor closely for tumor lysis. Stable.   # added incentive spirometry- discussed with Dr.Sreeram.    Earna Coder, MD 03/13/2024  8:17 AM

## 2024-03-13 NOTE — Progress Notes (Signed)
   03/13/24 2200  Spiritual Encounters  Type of Visit Initial  Care provided to: Patient  Conversation partners present during encounter Nurse  Referral source Nurse (RN/NT/LPN)  Reason for visit Code (RRT)  OnCall Visit Yes   Chaplain responded to Rapid Response.  No family at bedside.  Chaplain spiritual support services remain available as the need arises.

## 2024-03-13 NOTE — Progress Notes (Signed)
       CROSS COVER NOTE  NAME: Jesse Norton MRN: [id number] DOB : 09-25-1946 ATTENDING PHYSICIAN: Marcelino Duster, MD    Date of Service   03/13/2024   HPI/Events of Note   Rapid response for syncopal event after getting to bedside toilet and passing liquid, dark red/black stools Patient admitted with upper GI bleed and symptomatic anemia s/p 5 units PRBCs.  EGD done 3/26 + candida esophagitis. Eliquis and plavix for afib on hold secondary to bleed.  Recently started on chemo on 3/28 for his non hodgkins lymphoma that is now on hold. BP lability ongoing as noted by attending "difficult to assess true blood pressure as patient has significant edema of bilateral upper extremities. Do not think his true pressures are assessed accurately so will need to monitor for symptoms of hypotension which is also difficult due to overlapping symptoms from his chemo treatment/cancer/anemia.  Received 1L bolus, blood transfusion, now on midodrine. Tolerating well. decreased home metoprolol"   Interventions   Assessment/Plan:    03/13/2024    7:40 PM 03/13/2024    5:02 PM 03/13/2024    2:06 PM  Vitals with BMI  Systolic 135 103 562  Diastolic 117 76 63  Pulse 106 130 113   EKG ST 127 bpm no ischemic changes Weak and pale, oriented x 4 Some work of breathing, faint crackles BLL  Chest xray IMPRESSION: 1. Bilateral hilar prominence consistent with adenopathy seen on recent CT. 2. No acute airspace disease.  BNP 104 HGB decrease to 6.4 (goal above 8 noted) Pancytopenia - WBC count decrease <0.1 and platelets drop to 76 from 120 (on filgrastin)  Syncopal event  CT head stat 500 ml LR bolus Albumin 25 grams q6h for 4  doses Transfuse 2 units PRBC Neutropenic precautions Transferred to stepdown for closer monitoring - wife updated on phone Despite blood and albumin pressure lability continued and episode of significant increase in work of breathing with reported drop in oxygen sats again  occured. Pressors initiated and consult PCCM for additional management/ ongoing sepsis workup - wife updated on phone     Donnie Mesa NP [facility] Cross Cover 7pm-7am - check amion for availability Pager [phone]

## 2024-03-14 ENCOUNTER — Inpatient Hospital Stay

## 2024-03-14 ENCOUNTER — Inpatient Hospital Stay: Admitting: Internal Medicine

## 2024-03-14 ENCOUNTER — Ambulatory Visit

## 2024-03-14 ENCOUNTER — Encounter: Payer: Self-pay | Admitting: Internal Medicine

## 2024-03-14 DIAGNOSIS — J9601 Acute respiratory failure with hypoxia: Secondary | ICD-10-CM

## 2024-03-14 DIAGNOSIS — R579 Shock, unspecified: Secondary | ICD-10-CM

## 2024-03-14 DIAGNOSIS — K922 Gastrointestinal hemorrhage, unspecified: Secondary | ICD-10-CM | POA: Diagnosis not present

## 2024-03-14 DIAGNOSIS — R6521 Severe sepsis with septic shock: Secondary | ICD-10-CM | POA: Insufficient documentation

## 2024-03-14 DIAGNOSIS — Z515 Encounter for palliative care: Secondary | ICD-10-CM | POA: Diagnosis not present

## 2024-03-14 DIAGNOSIS — B3781 Candidal esophagitis: Secondary | ICD-10-CM | POA: Diagnosis not present

## 2024-03-14 DIAGNOSIS — C8333 Diffuse large B-cell lymphoma, intra-abdominal lymph nodes: Secondary | ICD-10-CM | POA: Diagnosis not present

## 2024-03-14 DIAGNOSIS — E871 Hypo-osmolality and hyponatremia: Secondary | ICD-10-CM

## 2024-03-14 DIAGNOSIS — D649 Anemia, unspecified: Secondary | ICD-10-CM | POA: Diagnosis not present

## 2024-03-14 LAB — BPAM RBC
Blood Product Expiration Date: 202504132359
Blood Product Expiration Date: 202504222359
Blood Product Expiration Date: 202504282359
ISSUE DATE / TIME: 202504031106
ISSUE DATE / TIME: 202503311059
ISSUE DATE / TIME: 202504021421
Unit Type and Rh: 600
Unit Type and Rh: 600
Unit Type and Rh: 6200

## 2024-03-14 LAB — PROCALCITONIN: Procalcitonin: 8.16 ng/mL

## 2024-03-14 LAB — CBC
HCT: 19.9 % — ABNORMAL LOW (ref 39.0–52.0)
Hemoglobin: 7.1 g/dL — ABNORMAL LOW (ref 13.0–17.0)
MCH: 33.3 pg (ref 26.0–34.0)
MCHC: 35.7 g/dL (ref 30.0–36.0)
MCV: 93.4 fL (ref 80.0–100.0)
Platelets: 34 10*3/uL — ABNORMAL LOW (ref 150–400)
RBC: 2.13 MIL/uL — ABNORMAL LOW (ref 4.22–5.81)
RDW: 18.4 % — ABNORMAL HIGH (ref 11.5–15.5)
WBC: 0.1 10*3/uL — CL (ref 4.0–10.5)
nRBC: 0 % (ref 0.0–0.2)

## 2024-03-14 LAB — BLOOD GAS, ARTERIAL
Acid-base deficit: 2.4 mmol/L — ABNORMAL HIGH (ref 0.0–2.0)
Acid-base deficit: 2.8 mmol/L — ABNORMAL HIGH (ref 0.0–2.0)
Bicarbonate: 23.7 mmol/L (ref 20.0–28.0)
Bicarbonate: 23.2 mmol/L (ref 20.0–28.0)
Delivery systems: POSITIVE
Expiratory PAP: 6 cmH2O
FIO2: 100 %
Inspiratory PAP: 12 cmH2O
O2 Content: 4 L/min
O2 Saturation: 92.1 %
O2 Saturation: 89.6 %
Patient temperature: 37
Patient temperature: 37
pCO2 arterial: 45 mmHg (ref 32–48)
pCO2 arterial: 44 mmHg (ref 32–48)
pH, Arterial: 7.33 — ABNORMAL LOW (ref 7.35–7.45)
pH, Arterial: 7.33 — ABNORMAL LOW (ref 7.35–7.45)
pO2, Arterial: 59 mmHg — ABNORMAL LOW (ref 83–108)
pO2, Arterial: 56 mmHg — ABNORMAL LOW (ref 83–108)

## 2024-03-14 LAB — COMPREHENSIVE METABOLIC PANEL WITH GFR
ALT: 15 U/L (ref 0–44)
AST: 28 U/L (ref 15–41)
Albumin: 2.1 g/dL — ABNORMAL LOW (ref 3.5–5.0)
Alkaline Phosphatase: 34 U/L — ABNORMAL LOW (ref 38–126)
Anion gap: 9 (ref 5–15)
BUN: 48 mg/dL — ABNORMAL HIGH (ref 8–23)
CO2: 24 mmol/L (ref 22–32)
Calcium: 8.1 mg/dL — ABNORMAL LOW (ref 8.9–10.3)
Chloride: 105 mmol/L (ref 98–111)
Creatinine, Ser: 1.51 mg/dL — ABNORMAL HIGH (ref 0.61–1.24)
GFR, Estimated: 47 mL/min — ABNORMAL LOW (ref >60)
Glucose, Bld: 95 mg/dL (ref 70–99)
Potassium: 3.7 mmol/L (ref 3.5–5.1)
Sodium: 138 mmol/L (ref 135–145)
Total Bilirubin: 0.7 mg/dL (ref 0.0–1.2)
Total Protein: 3.9 g/dL — ABNORMAL LOW (ref 6.5–8.1)

## 2024-03-14 LAB — TYPE AND SCREEN
ABO/RH(D): A POS
Antibody Screen: NEGATIVE
Unit division: 0
Unit division: 0
Unit division: 0

## 2024-03-14 LAB — RESP PANEL BY RT-PCR (RSV, FLU A&B, COVID)  RVPGX2
Influenza A by PCR: NEGATIVE
Influenza B by PCR: NEGATIVE
Resp Syncytial Virus by PCR: NEGATIVE
SARS Coronavirus 2 by RT PCR: NEGATIVE

## 2024-03-14 LAB — URINALYSIS, W/ REFLEX TO CULTURE (INFECTION SUSPECTED)
Bacteria, UA: NONE SEEN
Bilirubin Urine: NEGATIVE
Glucose, UA: NEGATIVE mg/dL
Hgb urine dipstick: NEGATIVE
Ketones, ur: NEGATIVE mg/dL
Leukocytes,Ua: NEGATIVE
Nitrite: NEGATIVE
Protein, ur: NEGATIVE mg/dL
Specific Gravity, Urine: 1.009 (ref 1.005–1.030)
pH: 5 (ref 5.0–8.0)

## 2024-03-14 LAB — MAGNESIUM: Magnesium: 2.1 mg/dL (ref 1.7–2.4)

## 2024-03-14 LAB — LACTATE DEHYDROGENASE: LDH: 290 U/L — ABNORMAL HIGH (ref 98–192)

## 2024-03-14 LAB — GLUCOSE, CAPILLARY
Glucose-Capillary: 89 mg/dL (ref 70–99)
Glucose-Capillary: 123 mg/dL — ABNORMAL HIGH (ref 70–99)

## 2024-03-14 LAB — MRSA NEXT GEN BY PCR, NASAL: MRSA by PCR Next Gen: NOT DETECTED

## 2024-03-14 LAB — URIC ACID: Uric Acid, Serum: 3.6 mg/dL — ABNORMAL LOW (ref 3.7–8.6)

## 2024-03-14 LAB — PHOSPHORUS: Phosphorus: 2.1 mg/dL — ABNORMAL LOW (ref 2.5–4.6)

## 2024-03-14 LAB — LACTIC ACID, PLASMA
Lactic Acid, Venous: 5.3 mmol/L (ref 0.5–1.9)
Lactic Acid, Venous: 5.5 mmol/L (ref 0.5–1.9)

## 2024-03-14 LAB — PREPARE RBC (CROSSMATCH)

## 2024-03-14 MED ORDER — SODIUM CHLORIDE 0.9 % IV SOLN
500.0000 mg | Freq: Two times a day (BID) | INTRAVENOUS | Status: DC
  Filled 2024-03-14: qty 500

## 2024-03-14 MED ORDER — FUROSEMIDE 10 MG/ML IJ SOLN: 40.0000 mg | Freq: Once | INTRAMUSCULAR | Status: DC

## 2024-03-14 MED ORDER — NOREPINEPHRINE 4 MG/250ML-% IV SOLN
0.0000 ug/min | INTRAVENOUS | Status: DC
Start: 2024-03-14 — End: 2024-03-14

## 2024-03-14 MED ORDER — METRONIDAZOLE 500 MG/100ML IV SOLN
500.0000 mg | Freq: Two times a day (BID) | INTRAVENOUS | Status: DC
  Administered 2024-03-14: 500 mg via INTRAVENOUS
  Filled 2024-03-14 (×2): qty 100

## 2024-03-14 MED ORDER — SODIUM PHOSPHATES 45 MMOLE/15ML IV SOLN
30.0000 mmol | Freq: Once | INTRAVENOUS | Status: AC
  Administered 2024-03-14: 30 mmol via INTRAVENOUS
  Filled 2024-03-14: qty 10

## 2024-03-14 MED ORDER — VANCOMYCIN HCL 1250 MG/250ML IV SOLN: 1250.0000 mg | INTRAVENOUS | Status: DC

## 2024-03-14 MED ORDER — SODIUM CHLORIDE 0.9 % IV SOLN: 300.0000 mg | INTRAVENOUS | Status: DC

## 2024-03-14 MED ORDER — FLUCONAZOLE IN SODIUM CHLORIDE 400-0.9 MG/200ML-% IV SOLN
400.0000 mg | INTRAVENOUS | Status: DC
  Administered 2024-03-14: 400 mg via INTRAVENOUS
  Filled 2024-03-14: qty 200

## 2024-03-14 MED ORDER — METHYLENE BLUE (ANTIDOTE) 1 % IV SOLN
1.0000 mg/kg | Freq: Once | INTRAVENOUS | Status: DC
  Filled 2024-03-14: qty 11

## 2024-03-14 MED ORDER — METHYLENE BLUE (ANTIDOTE) 1 % IV SOLN
100.0000 mg | Freq: Once | INTRAVENOUS | Status: AC
  Administered 2024-03-14: 100 mg via INTRAVENOUS
  Filled 2024-03-14: qty 10

## 2024-03-14 MED ORDER — ORAL CARE MOUTH RINSE: 15.0000 mL | OROMUCOSAL | Status: DC | PRN

## 2024-03-14 MED ORDER — SODIUM CHLORIDE 0.9 % IV SOLN
2.0000 g | Freq: Two times a day (BID) | INTRAVENOUS | Status: DC
  Administered 2024-03-14: 2 g via INTRAVENOUS
  Filled 2024-03-14 (×2): qty 12.5

## 2024-03-14 MED ORDER — FUROSEMIDE 10 MG/ML IJ SOLN
60.0000 mg | Freq: Once | INTRAMUSCULAR | Status: AC
  Administered 2024-03-14: 60 mg via INTRAVENOUS

## 2024-03-14 MED ORDER — KETAMINE HCL 50 MG/5ML IJ SOSY
1.0000 mg/kg | PREFILLED_SYRINGE | Freq: Once | INTRAMUSCULAR | Status: AC
  Administered 2024-03-14: 100 mg via INTRAVENOUS

## 2024-03-14 MED ORDER — ALLOPURINOL 300 MG PO TABS: 300.0000 mg | ORAL_TABLET | Freq: Two times a day (BID) | ORAL | Status: DC

## 2024-03-14 MED ORDER — ADULT MULTIVITAMIN W/MINERALS CH: 1.0000 | ORAL_TABLET | Freq: Every day | ORAL | Status: DC

## 2024-03-14 MED ORDER — VANCOMYCIN HCL 2000 MG/400ML IV SOLN
2000.0000 mg | Freq: Once | INTRAVENOUS | Status: DC
Start: 2024-03-14 — End: 2024-03-14
  Filled 2024-03-14: qty 400

## 2024-03-14 MED ORDER — KETAMINE HCL-SODIUM CHLORIDE 1000-0.69 MG/100ML-% IV SOLN
0.5000 mg/kg/h | INTRAVENOUS | Status: DC
  Filled 2024-03-14: qty 100

## 2024-03-14 MED ORDER — ETOMIDATE 2 MG/ML IV SOLN
INTRAVENOUS | Status: AC
  Filled 2024-03-14: qty 10

## 2024-03-14 MED ORDER — VASOPRESSIN 20 UNITS/100 ML INFUSION FOR SHOCK
0.0000 [IU]/min | INTRAVENOUS | Status: DC
  Administered 2024-03-14: 0.04 [IU]/min via INTRAVENOUS
  Administered 2024-03-14: 0.03 [IU]/min via INTRAVENOUS
  Filled 2024-03-14 (×2): qty 100

## 2024-03-14 MED ORDER — ACYCLOVIR 200 MG PO CAPS: 400.0000 mg | ORAL_CAPSULE | Freq: Two times a day (BID) | ORAL | Status: DC

## 2024-03-14 MED ORDER — MIDODRINE HCL 5 MG PO TABS: 10.0000 mg | ORAL_TABLET | Freq: Three times a day (TID) | ORAL | Status: DC

## 2024-03-14 MED ORDER — VORICONAZOLE 200 MG IV SOLR: 4.0000 mg/kg | Freq: Two times a day (BID) | INTRAVENOUS | Status: DC

## 2024-03-14 MED ORDER — KETAMINE HCL 50 MG/5ML IJ SOSY
PREFILLED_SYRINGE | INTRAMUSCULAR | Status: AC
  Filled 2024-03-14: qty 5

## 2024-03-14 MED ORDER — ORAL CARE MOUTH RINSE: 15.0000 mL | OROMUCOSAL | Status: DC

## 2024-03-14 MED ORDER — SUCCINYLCHOLINE CHLORIDE 200 MG/10ML IV SOSY
150.0000 mg | PREFILLED_SYRINGE | Freq: Once | INTRAVENOUS | Status: AC
  Administered 2024-03-14: 150 mg via INTRAVENOUS

## 2024-03-14 MED ORDER — NOREPINEPHRINE 16 MG/250ML-% IV SOLN
0.0000 ug/min | INTRAVENOUS | Status: DC
  Administered 2024-03-14: 40 ug/min via INTRAVENOUS
  Administered 2024-03-14: 60 ug/min via INTRAVENOUS
  Filled 2024-03-14 (×3): qty 250

## 2024-03-14 MED ORDER — MAGNESIUM SULFATE 2 GM/50ML IV SOLN
2.0000 g | Freq: Once | INTRAVENOUS | Status: AC
  Administered 2024-03-14: 2 g via INTRAVENOUS
  Filled 2024-03-14: qty 50

## 2024-03-14 MED ORDER — SODIUM CHLORIDE 0.9 % IV SOLN
300.0000 mg | Freq: Two times a day (BID) | INTRAVENOUS | Status: DC
Start: 2024-03-14 — End: 2024-03-14

## 2024-03-14 MED ORDER — EPINEPHRINE HCL 5 MG/250ML IV SOLN IN NS
0.5000 ug/min | INTRAVENOUS | Status: DC
  Administered 2024-03-14: 5 ug/min via INTRAVENOUS
  Filled 2024-03-14: qty 250

## 2024-03-14 MED ORDER — SUCCINYLCHOLINE CHLORIDE 200 MG/10ML IV SOSY
PREFILLED_SYRINGE | INTRAVENOUS | Status: AC
  Filled 2024-03-14: qty 10

## 2024-03-14 MED ORDER — SODIUM BICARBONATE 8.4 % IV SOLN
50.0000 meq | Freq: Once | INTRAVENOUS | Status: AC
  Administered 2024-03-14: 50 meq via INTRAVENOUS
  Filled 2024-03-14: qty 50

## 2024-03-14 MED ORDER — MIRTAZAPINE 15 MG PO TABS: 7.5000 mg | ORAL_TABLET | Freq: Every day | ORAL | Status: DC

## 2024-03-14 MED ORDER — DOCUSATE SODIUM 50 MG/5ML PO LIQD: 100.0000 mg | Freq: Two times a day (BID) | ORAL | Status: DC

## 2024-03-14 MED ORDER — ENSURE ENLIVE PO LIQD: 237.0000 mL | Freq: Three times a day (TID) | ORAL | Status: DC

## 2024-03-14 MED ORDER — HYDROCORTISONE SOD SUC (PF) 100 MG IJ SOLR
100.0000 mg | Freq: Three times a day (TID) | INTRAMUSCULAR | Status: DC
  Administered 2024-03-14: 100 mg via INTRAVENOUS
  Filled 2024-03-14: qty 2

## 2024-03-14 MED ORDER — ORAL CARE MOUTH RINSE
15.0000 mL | OROMUCOSAL | Status: DC
  Administered 2024-03-14: 15 mL via OROMUCOSAL

## 2024-03-14 MED ORDER — KETAMINE HCL 10 MG/ML IJ SOLN
0.5000 mg/kg/h | Status: DC
  Filled 2024-03-14: qty 100

## 2024-03-14 MED ORDER — ALBUMIN HUMAN 25 % IV SOLN
25.0000 g | Freq: Four times a day (QID) | INTRAVENOUS | Status: DC
  Administered 2024-03-14 (×2): 25 g via INTRAVENOUS
  Filled 2024-03-14: qty 100

## 2024-03-14 MED ORDER — SODIUM CHLORIDE 0.9 % IV SOLN
2.0000 g | Freq: Two times a day (BID) | INTRAVENOUS | Status: DC
  Filled 2024-03-14: qty 12.5

## 2024-03-14 MED ORDER — VANCOMYCIN HCL 2000 MG/400ML IV SOLN
2000.0000 mg | Freq: Once | INTRAVENOUS | Status: AC
  Administered 2024-03-14: 2000 mg via INTRAVENOUS
  Filled 2024-03-14: qty 400

## 2024-03-14 MED ORDER — SODIUM CHLORIDE 0.9 % IV SOLN
2.0000 g | Freq: Two times a day (BID) | INTRAVENOUS | Status: DC
Start: 2024-03-14 — End: 2024-03-14
  Filled 2024-03-14: qty 12.5

## 2024-03-14 MED ORDER — NOREPINEPHRINE 4 MG/250ML-% IV SOLN
INTRAVENOUS | Status: AC
  Administered 2024-03-14: 8 ug/min via INTRAVENOUS
  Filled 2024-03-14: qty 250

## 2024-03-14 MED ORDER — POLYETHYLENE GLYCOL 3350 17 G PO PACK: 17.0000 g | PACK | Freq: Every day | ORAL | Status: DC

## 2024-03-15 LAB — TYPE AND SCREEN
ABO/RH(D): A POS
Antibody Screen: NEGATIVE
Unit division: 0
Unit division: 0

## 2024-03-15 LAB — BLOOD GAS, ARTERIAL
Acid-base deficit: 3.1 mmol/L — ABNORMAL HIGH (ref 0.0–2.0)
Bicarbonate: 22 mmol/L (ref 20.0–28.0)
Delivery systems: POSITIVE
FIO2: 100 %
O2 Saturation: 98.9 %
Patient temperature: 37
pCO2 arterial: 39 mmHg (ref 32–48)
pH, Arterial: 7.36 (ref 7.35–7.45)
pO2, Arterial: 107 mmHg (ref 83–108)

## 2024-03-15 LAB — BPAM RBC
Blood Product Expiration Date: 202504162359
Blood Product Expiration Date: 202504302359
ISSUE DATE / TIME: 202504040209
ISSUE DATE / TIME: 202504040425
Unit Type and Rh: 5100
Unit Type and Rh: 9500

## 2024-03-16 LAB — BLOOD GAS, ARTERIAL
Acid-base deficit: 7.1 mmol/L — ABNORMAL HIGH (ref 0.0–2.0)
Bicarbonate: 22.5 mmol/L (ref 20.0–28.0)
FIO2: 100 %
Mechanical Rate: 20
O2 Saturation: 84.7 %
PEEP: 10 cmH2O
Patient temperature: 37
Pressure control: 18 cmH2O
pCO2 arterial: 63 mmHg — ABNORMAL HIGH (ref 32–48)
pH, Arterial: 7.16 — CL (ref 7.35–7.45)
pO2, Arterial: 55 mmHg — ABNORMAL LOW (ref 83–108)

## 2024-03-19 LAB — CULTURE, BLOOD (ROUTINE X 2)
Culture: NO GROWTH
Culture: NO GROWTH
Special Requests: ADEQUATE
Special Requests: ADEQUATE

## 2024-04-10 NOTE — Progress Notes (Signed)
 Ketamine infusion sent back to pharmacy by [facility] pharmacist.

## 2024-04-10 NOTE — Progress Notes (Signed)
   03/18/2024 0800  Spiritual Encounters  Type of Visit Initial  Care provided to: Family  Referral source Nurse (RN/NT/LPN)  Reason for visit Routine spiritual support  OnCall Visit Yes  Interventions  Spiritual Care Interventions Made Prayer;Encouragement;Established relationship of care and support;Compassionate presence;Reflective listening  Intervention Outcomes  Outcomes Connection to spiritual care;Awareness of support  Spiritual Care Plan  Spiritual Care Issues Still Outstanding No further spiritual care needs at this time (see row info)

## 2024-04-10 NOTE — Plan of Care (Signed)
  Problem: Education: Goal: Knowledge of General Education information will improve Description: Including pain rating scale, medication(s)/side effects and non-pharmacologic comfort measures Outcome: Progressing   Problem: Health Behavior/Discharge Planning: Goal: Ability to manage health-related needs will improve Outcome: Progressing   Problem: Clinical Measurements: Goal: Will remain free from infection Outcome: Progressing Goal: Diagnostic test results will improve Outcome: Progressing   Problem: Nutrition: Goal: Adequate nutrition will be maintained Outcome: Progressing   Problem: Coping: Goal: Level of anxiety will decrease Outcome: Progressing   Problem: Elimination: Goal: Will not experience complications related to bowel motility Outcome: Progressing Goal: Will not experience complications related to urinary retention Outcome: Progressing   Problem: Pain Managment: Goal: General experience of comfort will improve and/or be controlled Outcome: Progressing   Problem: Safety: Goal: Ability to remain free from injury will improve Outcome: Progressing   Problem: Skin Integrity: Goal: Risk for impaired skin integrity will decrease Outcome: Progressing   Problem: Clinical Measurements: Goal: Ability to maintain clinical measurements within normal limits will improve Outcome: Not Progressing Goal: Respiratory complications will improve Outcome: Not Progressing Goal: Cardiovascular complication will be avoided Outcome: Not Progressing   Problem: Activity: Goal: Risk for activity intolerance will decrease Outcome: Not Progressing

## 2024-04-10 NOTE — Procedures (Signed)
 Arterial Catheter Insertion Procedure Note  Jesse Norton  [id number]  [birth year]  Date:03/25/2024  Time:6:10 AM    Provider Performing: Cecelia Byars [place]    Procedure: Insertion of Arterial Line (40981) with US guidance (19147)   Indication(s) Blood pressure monitoring and/or need for frequent ABGs  Consent Risks of the procedure as well as the alternatives and risks of each were explained to the patient and/or caregiver.  Consent for the procedure was obtained and is signed in the bedside chart  Anesthesia None   Time Out Verified patient identification, verified procedure, site/side was marked, verified correct patient position, special equipment/implants available, medications/allergies/relevant history reviewed, required imaging and test results available.   Sterile Technique Maximal sterile technique including full sterile barrier drape, hand hygiene, sterile gown, sterile gloves, mask, hair covering, sterile ultrasound probe cover (if used).   Procedure Description Area of catheter insertion was cleaned with chlorhexidine and draped in sterile fashion. With real-time ultrasound guidance an arterial catheter was placed into the right radial artery.  Appropriate arterial tracings confirmed on monitor.     Complications/Tolerance None; patient tolerated the procedure well.   EBL Minimal   Specimen(s) None  Betsey Holiday, AGACNP-BC Acute Care Nurse Practitioner [facility]   [phone] / [phone] Please see Amion for details.

## 2024-04-10 NOTE — Progress Notes (Signed)
                                                                                                                                                                                                           Daily Progress Note   Patient Name: Jesse Norton       Date: 04/09/2024 DOB: March 24, 1946  Age: 78 y.o. MRN#: [id number] Attending Physician: No att. providers found Primary Care Physician: Marjie Skiff, NP Admit Date: 03/04/2024  Reason for Consultation/Follow-up: Establishing goals of care  HPI/Brief Hospital Review: ARMISTEAD SULT is a 78 y.o. male with multiple medical problems including A-fib on Eliquis, diffuse large B-cell lymphoma, HFpEF, CAD status post PCI, Barrett's esophagitis, COPD.  Patient was recently hospitalized with A-fib with RVR started on Eliquis.  He was readmitted to hospital with melena.  Patient acutely declined on 04/09/2024 requiring transfer to the ICU where patient was subsequently intubated.  Palliative care consulted to address goals.   Subjective: Extensive chart review has been completed prior to meeting patient including labs, vital signs, imaging, progress notes, orders, and available advanced directive documents from current and previous encounters.    Rounding in ICU but called to bedside due to Mr. Gaffey becoming bradycardic with significant hypotension. At bedside with CCM NP and charge RN-pulse not auscultated by nursing staff. Wife and children at bedside-awaiting sons's arrival later today. Family aware of situation and discussions had to liberate from Mr. Trolinger from ventilator and stop all IV medications for which family in agreement. Remained without pulse, TOD called. Chaplain at bedside with family.  Thank you for allowing the Palliative Medicine Team to assist in the care of this patient.  Total time:  25 minutes  Time spent includes: Detailed review of medical records (labs, imaging, vital signs), medically appropriate exam (mental status,  respiratory, cardiac, skin), discussed with treatment team, counseling and educating patient, family and staff, documenting clinical information, medication management and coordination of care.  Leeanne Deed, DNP, AGNP-C Palliative Medicine   Please contact Palliative Medicine Team phone at [phone] for questions and concerns.

## 2024-04-10 NOTE — Progress Notes (Signed)
 Pharmacy Antibiotic Note  Jesse Norton is a 78 y.o. male admitted on 03/04/2024 with sepsis.  Pharmacy has been consulted for Cefepime & Vancomycin dosing.  Plan: Cefepime 2 gm q12hr per indication & renal fxn.  Pt given Vancomycin 2000 mg once. Vancomycin 1250 mg IV Q 24 hrs. Goal AUC 400-550. Expected AUC: 412.2 SCr used: 1.51 (trending up), Vd used: 0.72, BMI: 34.6  Pharmacy will continue to follow and will adjust abx dosing whenever warranted.  Temp (24hrs), Avg:98.9 F (37.2 C), Min:98.1 F (36.7 C), Max:100.1 F (37.8 C)   Recent Labs  Lab 03/10/24 0510 03/11/24 0642 03/12/24 1020 03/12/24 1025 03/13/24 0602 03/13/24 2229 04/08/2024 0437  WBC 11.3* 4.1  --  1.2* 0.8* <0.1*  --   CREATININE  --  1.07 1.02  --  1.20 1.29* 1.51*  LATICACIDVEN  --   --   --   --   --   --  5.3*    Estimated Creatinine Clearance: 49.2 mL/min (A) (by C-G formula based on SCr of 1.51 mg/dL (H)).    No Known Allergies  Antimicrobials this admission: 3/26 Acyclovir >>  4/04 Cefepime >>  4/04 Flagyl >> 4/04 Fluconazole >>  4/04 Vancomycin >>  Microbiology results: 4/04 BCx: Pending 4/04 FungusCx: Pending   Thank you for allowing pharmacy to be a part of this patient's care.  Otelia Sergeant, PharmD, [facility] 04/09/2024 5:52 AM

## 2024-04-10 NOTE — Procedures (Signed)
 Intubation Procedure Note  NAHIEM DREDGE  [id number]  1946/12/07  Date:04/01/2024  Time:9:14 AM   Provider Performing:Marthena Whitmyer D Elvina Sidle    Procedure: Intubation (31500)  Indication(s) Respiratory Failure  Consent Risks of the procedure as well as the alternatives and risks of each were explained to the patient and/or caregiver.  Consent for the procedure was obtained and is signed in the bedside chart   Anesthesia Succinylcholine and Ketamine   Time Out Verified patient identification, verified procedure, site/side was marked, verified correct patient position, special equipment/implants available, medications/allergies/relevant history reviewed, required imaging and test results available.   Sterile Technique Usual hand hygeine, masks, and gloves were used   Procedure Description Patient positioned in bed supine.  Sedation given as noted above.  Patient was intubated with endotracheal tube using Glidescope.  View was Grade 1 full glottis .  Number of attempts was 1.  Colorimetric CO2 detector was consistent with tracheal placement.   Complications/Tolerance None; patient tolerated the procedure well. Chest X-ray is ordered to verify placement.   EBL Minimal   Specimen(s) None    Size 7.5 ETT Tube secured at 23 cm at lip.   Harlon Ditty, AGACNP-BC [facility] Prefer epic messenger for cross cover needs If after hours, please call E-link ]

## 2024-04-10 NOTE — IPAL (Signed)
  Interdisciplinary Goals of Care Family Meeting   Date carried out: 03/20/2024  Location of the meeting: Bedside  Member's involved: Physician and Family Member or next of kin  Durable Power of Attorney or acting medical decision maker: Patient's wife and son.    Discussion: We discussed goals of care for Jesse Norton. Explained that he's decompensated over the past 24 hours with progressive shock secondary to sepsis. He also has concomitant respiratory failure requiring intubation. He is currently on three vasopressors for blood pressure support + hydrocortisone, with plan to add methylene blue. He remains hypoxic and hypotensive. Explained we have reached the limit of our ability to care for him medically. Discussed code status, and explained that if his heart were to stop chest compressions would not bring him back. We agreed to continue with aggressive medical management while his other son is on his way, but to hold short of chest compressions. Wife and son agreed to change code status to DNR.  Code status: DNR  Disposition: Continue current acute care  Time spent for the meeting: 20 minutes    Raechel Chute, MD  03/28/2024, 9:36 AM

## 2024-04-10 NOTE — Progress Notes (Signed)
 Patient pronounced dead at 1149 by me and Katie per MD order, family at bedside during passing, drips as charted, no discomfort noticed, chaplin with family. MD made aware.

## 2024-04-10 NOTE — Progress Notes (Signed)
   03/18/2024 1100  Spiritual Encounters  Type of Visit Follow up  Care provided to: Family  Referral source Chaplain assessment  Reason for visit End-of-life  OnCall Visit No   Chaplain went to check on patient and family since he was moved to ICU. Chaplain sat and talked with son and wife and offered them words of encouragement. Chaplain is available for spiritual and emotional support whenever they need.

## 2024-04-10 NOTE — Death Summary Note (Signed)
 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 Secondary Diagnoses (including complications and co-morbidities):  Principal Problem:   Upper GI bleed Active Problems:   Esophageal lesion   Coronary artery disease of native artery of native heart with stable angina pectoris (HCC)   Chronic heart failure with preserved ejection fraction (HCC)   DLBCL (diffuse large B cell lymphoma) (HCC)   Paroxysmal atrial fibrillation (HCC)   Symptomatic anemia   Leg edema, right   AKI (acute kidney injury) (HCC)   Melena   Postpyloric ulcer   Shock (HCC)   Severe sepsis with septic shock (CODE) (HCC)   Acute respiratory failure with hypoxia [facility])   Brief Hospital Course (including significant findings, care, treatment, and services provided and events leading to death)  Jesse Norton is a 78 y.o. year old male  presenting to [facility] ED from the cancer center for evaluation of rectal bleeding on 03-18-24.   History provided per chart review as patient is too lethargic to currently participate in an interview at this time. Patient reported black stools over the previous week and a half that has been worsening. He discontinued his Eliquis on his own on 03/03/24 due to concern for possible GI bleed. Lab work prior to arrival showed him anemic at 6.5 and was sent to the ED. He endorsed generalized fatigue,  weakness, shortness of breath on exertion, dizziness, mild nausea without vomiting. No hematemesis, no NSAID use and denies abdominal pain.    ED course: Patient alert and responsive on arrival, afebrile, normotensive with a HR of 71 and 93% on RA. Labs significant for anemia, MCV of 110, AKI with Cr. 1.44, hyponatremia at 129, and mild Transaminitis. 2 units of pRBC's ordered and the patient was started on Protonix. TRH consulted for admission.     Hospital Course: See significant hospital events for details.   Significant labs: (Labs/ Imaging personally reviewed) I, Cheryll Cockayne Rust-Chester, AGACNP-BC, personally viewed and interpreted this ECG. EKG Interpretation: Date: 03/13/24, EKG Time: 22:08, Rate: 127, Rhythm: ST, QRS Axis:  normal Intervals: normal, ST/T Wave abnormalities: non specific T wave abnormalities, Narrative Interpretation: ST Chemistry: Na+:138, K+: 3.7, BUN/Cr.: 48/ 1.51, Serum CO2/ AG: 24/9, Hematology: WBC: <0.1 , Hgb: 6.4, plt: 76 Troponin: 19 > 16, BNP: 104, Lactic/ PCT: 5.3/ pending   ABG: 7.47/ 29/ 108/ 21.1 CXR 2024-03-28: lowe lung volumes with increasing non specific patchy bibasilar opacity CT head 2024/03/28: no acute intracranial disease   PCCM consulted for assistance in management and monitoring due to suspected severe sepsis with shock requiring vasopressor support.  Please see "Significant Hospital Events" section below for full detailed hospital course.  Significant Hospital Events: Including procedures, antibiotic start and stop dates in addition to other pertinent events   03/18/24: Admit with suspected upper GIB, GI consulted & heme/onc consulted 03/05/24: EGD performed without signs of bleeding. Candidal esophagitis noted- Diflucan started 03/06/24: GI s/off 3/28: chemo treatment completed and tolerated  well 03/08/24 -hgb stable. Restarting plavix due to h/o DES. Continue PT evaluation for goal to go home in next couple days 3/30:Experiencing  weakness/nausea in post-chemo setting.   3/31. Restarted plavix due to h/o DES. Continue PT evaluation for goal to go home in next couple day  03/12/24: Patient is seen and examined today morning. He is sitting in chair. Feels better than yesterday, worked with PT. hemoglobin came back 6.3.  He denies active bleeding.  03/13/24: large bloody bowel mvmt. Plavix d/c'd. Hgb 7.0. overnight rapid response with transfer to ICU for syncopal episode. CTH negative, Hgb dropped to 6.4. 2 units of blood transfused. Low grade fever, sepsis w/u initiated, severe shock with multiple vasopressors started. PCCM consulted. 03/11/2024: With worsening refractory shock despite 3 vasopressors and methylene blue.  Required intubation due to severe hypoxia and respiratory distress.   Code status changed to DNR following intubation.  Pt expired while on the vent despite max therapy.   Pertinent Labs and Studies  Significant Diagnostic Studies DG Abd 1 View Result Date: 04/08/2024 CLINICAL DATA:  Orogastric tube placement. EXAM: ABDOMEN - 1 VIEW COMPARISON:  None Available. FINDINGS: Nasogastric tube tip is seen in expected position of proximal stomach. IMPRESSION: Nasogastric tube tip seen in expected position of proximal stomach. Electronically Signed   By: Lupita Raider M.D.   On: 03/29/2024 10:10   DG Chest Port 1 View Result Date: 03/12/2024 CLINICAL DATA:  Endotracheal tube placement. EXAM: PORTABLE CHEST 1 VIEW COMPARISON:  Same day. FINDINGS: Endotracheal tube is in grossly good position. Nasogastric tube is seen entering stomach. Right internal jugular Port-A-Cath is unchanged. Increased left basilar opacity is noted concerning for worsening pneumonia. IMPRESSION: Endotracheal tube in grossly good position. Electronically Signed   By: Lupita Raider M.D.   On: 03/20/2024 10:10   DG Chest Port 1 View Result Date: 03/13/2024 CLINICAL DATA:  78 year old male with hypoxia.  Lymphoma. EXAM: PORTABLE CHEST 1 VIEW COMPARISON:   Portable chest 03/13/2024 and earlier. FINDINGS: Portable AP semi upright view at 0539 hours. Stable right chest Port-A-Cath. Lower lung volume since yesterday. Stable cardiac size and mediastinal contours. Visualized tracheal air column is within normal limits. No pneumothorax or pulmonary edema no pleural effusion. But patchy increasing bibasilar opacity. No air bronchograms. No acute osseous abnormality identified. Visible bowel-gas remains within normal limits. IMPRESSION: Lower lung volumes with increasing nonspecific patchy bibasilar opacity. Electronically Signed   By: Odessa Fleming M.D.   On: 04/06/2024 05:53   CT HEAD WO CONTRAST ( ) Result Date: 04/08/2024 CLINICAL DATA:  Syncope EXAM: CT HEAD WITHOUT CONTRAST TECHNIQUE: Contiguous axial images were obtained from the base of the skull through the vertex without intravenous contrast. RADIATION DOSE REDUCTION: This exam was performed according to the departmental dose-optimization program which includes automated exposure control, adjustment of the mA and/or kV according to patient size and/or use of iterative reconstruction technique. COMPARISON:  None Available. FINDINGS: Brain: There is no mass, hemorrhage or extra-axial collection. The size and configuration of the ventricles and extra-axial CSF spaces are normal. There is hypoattenuation of the white matter, most commonly indicating chronic small vessel disease. Vascular: Atherosclerotic calcification of the vertebral and internal carotid arteries at the skull base. No abnormal hyperdensity of the major intracranial arteries or dural venous sinuses. Skull: The visualized skull base, calvarium and extracranial soft tissues are normal. Sinuses/Orbits: No fluid levels or advanced mucosal thickening of the visualized paranasal sinuses. No mastoid or middle ear effusion. Normal orbits. Other: None. IMPRESSION:  1. No acute intracranial abnormality. 2. Chronic small vessel disease. Electronically Signed   By:  Deatra Robinson M.D.   On: 03/20/2024 02:22   DG Chest Port 1 View Result Date: 03/13/2024 CLINICAL DATA:  Short of breath, history of lymphoma EXAM: PORTABLE CHEST 1 VIEW COMPARISON:  02/26/2024 FINDINGS: Single frontal view of the chest demonstrates a stable right chest wall port. The cardiac silhouette is unremarkable. No acute airspace disease, effusion, or pneumothorax. Bilateral hilar prominence consistent with adenopathy on previous CT. No acute bony abnormalities. IMPRESSION: 1. Bilateral hilar prominence consistent with adenopathy seen on recent CT. 2. No acute airspace disease. Electronically Signed   By: Sharlet Salina M.D.   On: 03/13/2024 22:36   US Venous Img Upper Bilat (DVT) Result Date: 03/06/2024 CLINICAL DATA:  Bilateral upper extremity swelling. EXAM: BILATERAL UPPER EXTREMITY VENOUS DOPPLER ULTRASOUND TECHNIQUE: Gray-scale sonography with graded compression, as well as color Doppler and duplex ultrasound were performed to evaluate the bilateral upper extremity deep venous systems from the level of the subclavian vein and including the jugular, axillary, basilic, radial, ulnar and upper cephalic vein. Spectral Doppler was utilized to evaluate flow at rest and with distal augmentation maneuvers. COMPARISON:  February 27, 2024. FINDINGS: RIGHT UPPER EXTREMITY Internal Jugular Vein: No evidence of thrombus. Normal compressibility, respiratory phasicity and response to augmentation. Subclavian Vein: No evidence of thrombus. Normal compressibility, respiratory phasicity and response to augmentation. Axillary Vein: No evidence of thrombus. Normal compressibility, respiratory phasicity and response to augmentation. Cephalic Vein: No evidence of thrombus. Normal compressibility, respiratory phasicity and response to augmentation. Basilic Vein: No evidence of thrombus. Normal compressibility, respiratory phasicity and response to augmentation. Brachial Veins: No evidence of thrombus. Normal  compressibility, respiratory phasicity and response to augmentation. Radial Veins: No evidence of thrombus. Normal compressibility, respiratory phasicity and response to augmentation. Ulnar Veins: No evidence of thrombus. Normal compressibility, respiratory phasicity and response to augmentation. Venous Reflux:  None. Multiple enlarged lymph nodes are noted in right cervical region concerning for metastatic disease, the largest measuring 2.1 cm in minor axis. LEFT UPPER EXTREMITY Internal Jugular Vein: No evidence of thrombus. Normal compressibility, respiratory phasicity and response to augmentation. Subclavian Vein: No evidence of thrombus. Normal compressibility, respiratory phasicity and response to augmentation. Axillary Vein: No evidence of thrombus. Normal compressibility, respiratory phasicity and response to augmentation. Cephalic Vein: No evidence of thrombus. Normal compressibility, respiratory phasicity and response to augmentation. Basilic Vein: No evidence of thrombus. Normal compressibility, respiratory phasicity and response to augmentation. Brachial Veins: No evidence of thrombus. Normal compressibility, respiratory phasicity and response to augmentation. Radial Veins: No evidence of thrombus. Normal compressibility, respiratory phasicity and response to augmentation. Ulnar Veins: No evidence of thrombus. Normal compressibility, respiratory phasicity and response to augmentation. Venous Reflux:  None. Other Findings: Multiple enlarged left cervical lymph nodes are noted consistent with metastatic disease, the largest measuring 2.8 cm in minor axis. IMPRESSION: No evidence of DVT within either upper extremity. Bilateral cervical adenopathy is noted consistent with metastatic disease. Electronically Signed   By: Lupita Raider M.D.   On: 03/06/2024 14:43   US Venous Img Lower Unilateral Right (DVT) Result Date: 03/05/2024 CLINICAL DATA:  Right leg swelling. EXAM: RIGHT LOWER EXTREMITY VENOUS  DOPPLER ULTRASOUND TECHNIQUE: Gray-scale sonography with graded compression, as well as color Doppler and duplex ultrasound were performed to evaluate the lower extremity deep venous systems from the level of the common femoral vein and including the common femoral, femoral, profunda femoral, popliteal and calf veins including  the posterior tibial, peroneal and gastrocnemius veins when visible. The superficial great saphenous vein was also interrogated. Spectral Doppler was utilized to evaluate flow at rest and with distal augmentation maneuvers in the common femoral, femoral and popliteal veins. COMPARISON:  None Available. FINDINGS: Contralateral Common Femoral Vein: Respiratory phasicity is normal and symmetric with the symptomatic side. No evidence of thrombus. Normal compressibility. Common Femoral Vein: No evidence of thrombus. Normal compressibility, respiratory phasicity and response to augmentation. Saphenofemoral Junction: No evidence of thrombus. Normal compressibility and flow on color Doppler imaging. Profunda Femoral Vein: No evidence of thrombus. Normal compressibility and flow on color Doppler imaging. Femoral Vein: No evidence of thrombus. Normal compressibility, respiratory phasicity and response to augmentation. Popliteal Vein: No evidence of thrombus. Normal compressibility, respiratory phasicity and response to augmentation. Calf Veins: Visualized right deep calf veins are patent without thrombus. Other Findings:  None. IMPRESSION: Negative for deep venous thrombosis in right lower extremity. Electronically Signed   By: Richarda Overlie M.D.   On: 03/05/2024 15:29   US Venous Img Upper Uni Left (DVT) Result Date: 02/27/2024 CLINICAL DATA:  Swelling.  History of lymphoma EXAM: Left UPPER EXTREMITY VENOUS DOPPLER ULTRASOUND TECHNIQUE: Gray-scale sonography with graded compression, as well as color Doppler and duplex ultrasound were performed to evaluate the upper extremity deep venous system from the  level of the subclavian vein and including the jugular, axillary, basilic, radial, ulnar and upper cephalic vein. Spectral Doppler was utilized to evaluate flow at rest and with distal augmentation maneuvers. COMPARISON:  None Available. FINDINGS: Contralateral Subclavian Vein: Respiratory phasicity is normal and symmetric with the symptomatic side. No evidence of thrombus. Normal compressibility. Internal Jugular Vein: No evidence of thrombus. Normal compressibility, respiratory phasicity and response to augmentation. Subclavian Vein: No evidence of thrombus. Normal compressibility, respiratory phasicity and response to augmentation. Axillary Vein: No evidence of thrombus. Normal compressibility, respiratory phasicity and response to augmentation. Cephalic Vein: No evidence of thrombus. Normal compressibility, respiratory phasicity and response to augmentation. Basilic Vein: No evidence of thrombus. Normal compressibility, respiratory phasicity and response to augmentation. Brachial Veins: No evidence of thrombus. Normal compressibility, respiratory phasicity and response to augmentation. Radial Veins: No evidence of thrombus. Normal compressibility, respiratory phasicity and response to augmentation. Ulnar Veins: No evidence of thrombus. Normal compressibility, respiratory phasicity and response to augmentation. Venous Reflux:  None visualized. Other Findings: Note is made of numerous abnormal nodes identified in the visualized neck. Please correlate with prior workup. Scattered soft tissue edema as well along the forearm. IMPRESSION: No evidence of DVT within the left upper extremity. Electronically Signed   By: Karen Kays M.D.   On: 02/27/2024 14:52   DG Lumbar Spine 2-3 Views Result Date: 02/26/2024 CLINICAL DATA:  Back pain EXAM: LUMBAR SPINE - 2-3 VIEW COMPARISON:  CT 01/31/2024 FINDINGS: Degenerative disc disease most pronounced at L5-S1 with disc space narrowing, spurring, and vacuum disc. Diffuse  degenerative facet disease, most pronounced in the lower lumbar spine. Normal alignment. No fracture. IMPRESSION: Degenerative disc and facet disease.  No acute bony abnormality. Electronically Signed   By: Charlett Nose M.D.   On: 02/26/2024 23:27   CT Angio Chest PE W/Cm &/Or Wo Cm Result Date: 02/26/2024 CLINICAL DATA:  PE suspected. High probability. * Tracking Code: BO * EXAM: CT ANGIOGRAPHY CHEST WITH CONTRAST TECHNIQUE: Multidetector CT imaging of the chest was performed using the standard protocol during bolus administration of intravenous contrast. Multiplanar CT image reconstructions and MIPs were obtained to evaluate the vascular anatomy. RADIATION  DOSE REDUCTION: This exam was performed according to the departmental dose-optimization program which includes automated exposure control, adjustment of the mA and/or kV according to patient size and/or use of iterative reconstruction technique. CONTRAST:  75mL OMNIPAQUE IOHEXOL 350 MG/ML SOLN COMPARISON:  PET-CT 02/05/2024 FINDINGS: Cardiovascular: No filling defects within the pulmonary arteries to suggest acute pulmonary embolism. Mediastinum/Nodes: Bulky mediastinum and LEFT axillary lymphadenopathy. RIGHT lower paratracheal node measures 3.4 cm short axis compared to 3.1 cm on PET-CT. LEFT supraclavicular node measures 3.2 cm compared to 2.3 cm. LEFT axillary nodal mass with bulky nodes. Individual node measures up to 3.4 cm compared to 2.3 cm. Node beneath the pectoralis muscle measures 2.7 cm compared to 2.0 cm. Visually nodes appear larger. Lungs/Pleura: Interstitial edema pattern. no  Infarction.  No pneumonia. 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 Multiple small spleen spanning diaphragm. Adrenal glands normal. Musculoskeletal: No aggressive osseous lesion. Review of the MIP images confirms the above findings. IMPRESSION: 1. No acute pulmonary embolism. 2. No acute pulmonary  parenchymal findings. 3. mild interstitial edema pattern. 4. Bulky mediastinal, supraclavicular and LEFT axillary adenopathy. The bulky LEFT axillary adenopathy is increased in size. Electronically Signed   By: Genevive Bi M.D.   On: 02/26/2024 17:07   DG Chest Port 1 View Result Date: 02/26/2024 CLINICAL DATA:  AFib with RVR EXAM: PORTABLE CHEST 1 VIEW COMPARISON:  Chest radiograph dated 02/13/2024 FINDINGS: Lines/tubes: Right chest wall port tip projects over the SVC. Lungs: Unchanged asymmetric elevation of the right hemidiaphragm. Low lung volumes with bronchovascular crowding. Hazy and patchy left lower lung opacity. Pleura: No pneumothorax or pleural effusion. Heart/mediastinum: Similar mildly enlarged cardiomediastinal silhouette. Bones: No acute osseous abnormality. IMPRESSION: Low lung volumes with bronchovascular crowding. Hazy and patchy left lower lung opacity, likely atelectasis. Aspiration or pneumonia can be considered in the appropriate clinical setting. Electronically Signed   By: Agustin Cree M.D.   On: 02/26/2024 16:30    Microbiology Recent Results (from the past 240 hours)  KOH prep     Status: None   Collection Time: 03/05/24  9:00 AM   Specimen: Esophagus  Result Value Ref Range Status   Specimen Description ESOPHAGUS  Final   Special Requests brushing  Final   KOH Prep   Final    YEAST PRESENT Performed at [facility], [address] [place], [place] [address]    Report Status 03/05/2024 FINAL  Final  MRSA Next Gen by PCR, Nasal     Status: None   Collection Time: 04/05/2024 12:39 AM   Specimen: Nasal Mucosa; Nasal Swab  Result Value Ref Range Status   MRSA by PCR Next Gen NOT DETECTED NOT DETECTED Final    Comment: (NOTE) The GeneXpert MRSA Assay (FDA approved for NASAL specimens only), is one component of a comprehensive MRSA colonization surveillance program. It is not intended to diagnose MRSA infection nor to guide or monitor treatment for MRSA  infections. Test performance is not FDA approved in patients less than 53 years old. Performed at [facility], [address] [place], [place] [address]   Resp panel by RT-PCR (RSV, Flu A&B, Covid) Anterior Nasal Swab     Status: None   Collection Time: 03/15/2024  7:58 AM   Specimen: Anterior Nasal Swab  Result Value Ref Range Status   SARS Coronavirus 2 by RT PCR NEGATIVE NEGATIVE Final    Comment: (NOTE) SARS-CoV-2 target nucleic acids are NOT DETECTED.  The SARS-CoV-2 RNA is generally detectable in upper respiratory specimens during the acute phase of infection. The lowest concentration of SARS-CoV-2 viral copies this assay can detect is 138 copies/mL. A negative result does not preclude SARS-Cov-2 infection and should not be used as the sole basis for treatment or other patient management decisions. A negative result may occur with  improper specimen collection/handling, submission of specimen other than nasopharyngeal swab, presence of viral mutation(s) within the areas targeted by this assay, and inadequate number of viral copies(<138 copies/mL). A negative result must be combined with clinical observations, patient history, and epidemiological information. The expected result is Negative.  Fact Sheet for Patients:  BloggerCourse.com  Fact Sheet for Healthcare Providers:  SeriousBroker.it  This test is no t yet approved or cleared by the [place] FDA and  has been authorized for detection and/or diagnosis of SARS-CoV-2 by FDA under an Emergency Use Authorization (EUA). This EUA will remain  in effect (meaning this test can be used) for the duration of the COVID-19 declaration under Section 564(b)(1) of the Act, 21 U.S.C.section 360bbb-3(b)(1), unless the authorization is terminated  or revoked sooner.       Influenza A by PCR NEGATIVE NEGATIVE Final   Influenza B by PCR NEGATIVE NEGATIVE Final     Comment: (NOTE) The Xpert Xpress SARS-CoV-2/FLU/RSV plus assay is intended as an aid in the diagnosis of influenza from Nasopharyngeal swab specimens and should not be used as a sole basis for treatment. Nasal washings and aspirates are unacceptable for Xpert Xpress SARS-CoV-2/FLU/RSV testing.  Fact Sheet for Patients: BloggerCourse.com  Fact Sheet for Healthcare Providers: SeriousBroker.it  This test is not yet approved or cleared by the [place] FDA and has been authorized for detection and/or diagnosis of SARS-CoV-2 by FDA under an Emergency Use Authorization (EUA). This EUA will remain in effect (meaning this test can be used) for the duration of the COVID-19 declaration under Section 564(b)(1) of the Act, 21 U.S.C. section 360bbb-3(b)(1), unless the authorization is terminated or revoked.     Resp Syncytial Virus by PCR NEGATIVE NEGATIVE Final    Comment: (NOTE) Fact Sheet for Patients: BloggerCourse.com  Fact Sheet for Healthcare Providers: SeriousBroker.it  This test is not yet approved or cleared by the [place] FDA and has been authorized for detection and/or diagnosis of SARS-CoV-2 by FDA under an Emergency Use Authorization (EUA). This EUA will remain in effect (meaning this test can be used) for the duration of the COVID-19 declaration under Section 564(b)(1) of the Act, 21 U.S.C. section 360bbb-3(b)(1), unless the authorization is terminated or revoked.  Performed at [facility], [address] [place], [place] [address]     Lab Basic Metabolic Panel: Recent Labs  Lab 03/11/24 [phone] 03/12/24 1020 03/13/24 0602 03/13/24 2229 04/09/2024 0437  NA 135 135 141 137 138  K 3.8 3.9 5.4* 4.1 3.7  CL 102 102 107 104 105  CO2 25 25 22 22 24   GLUCOSE 105* 192* 196* 156* 95  BUN 37* 40* 40* 46* 48*  CREATININE 1.07 1.02 1.20 1.29* 1.51*   CALCIUM 8.4* 8.2* 8.8* 8.3* 8.1*  MG  --   --   --  1.6* 2.1  PHOS  --   --   --  1.8* 2.1*   Liver Function Tests: Recent Labs  Lab 03/12/24 1020 03/13/24 0602 03/13/24 2229 03/28/2024 0437  AST 25 35 32 28  ALT 16 18 20 15   ALKPHOS 46 49 42 34*  BILITOT 0.4 0.9  0.6 0.7  PROT 4.4* 4.9* 4.3* 3.9*  ALBUMIN 2.1* 2.2* 2.0* 2.1*   No results for input(s): "LIPASE", "AMYLASE" in the last 168 hours. No results for input(s): "AMMONIA" in the last 168 hours. CBC: Recent Labs  Lab 03/11/24 0642 03/12/24 1025 03/13/24 0602 03/13/24 2229 04/03/2024 0839  WBC 4.1 1.2* 0.8* <0.1* 0.1*  NEUTROABS  --  0.5*  --  0.0*  --   HGB 7.9* 6.3* 7.0* 6.4* 7.1*  HCT 23.3* 19.1* 21.5* 18.7* 19.9*  MCV 101.3* 104.9* 101.4* 97.4 93.4  PLT 205 148* 120* 76* 34*   Cardiac Enzymes: No results for input(s): "CKTOTAL", "CKMB", "CKMBINDEX", "TROPONINI" in the last 168 hours. Sepsis Labs: Recent Labs  Lab 03/12/24 1025 03/13/24 0602 03/13/24 2229 03/18/2024 0437 04/03/2024 0839  PROCALCITON  --   --   --  8.16  --   WBC 1.2* 0.8* <0.1*  --  0.1*  LATICACIDVEN  --   --   --  5.3* 5.5*    Procedures/Operations  3/26: EGD 4/4: Endotracheal intubation  4/4: right radial A-line    Harlon Ditty, AGACNP-BC [facility] Prefer epic messenger for cross cover needs If after hours, please call E-link  Judithe Modest 04/07/2024, 11:52 AM

## 2024-04-10 NOTE — Progress Notes (Signed)
 PT Cancellation Note  Patient Details Name: Jesse Norton MRN: [id number] DOB: Jun 05, 1946   Cancelled Treatment:    Reason Eval/Treat Not Completed: Medical issues which prohibited therapy Per MD, d/c PT at this time due to medical issues. Please re-consult once medically appropriate. PT will complete orders at this time.   Maylon Peppers, PT, DPT Physical Therapist - [facility]  [facility]    Riely Oetken A Rosmarie Esquibel 03/20/2024, 8:51 AM

## 2024-04-10 NOTE — Consult Note (Signed)
 NAME:  Jesse Norton, MRN:  [id number], DOB:  09/21/1946, LOS: 10 ADMISSION DATE:  03/04/2024, CONSULTATION DATE:  03/15/2024 REFERRING MD:  Cliffton Asters, NP, CHIEF COMPLAINT:  Rectal bleeding   History of Present Illness:  78 yo M presenting to [facility] ED from the cancer center for evaluation of rectal bleeding on 03/04/24.  History provided per chart review as patient is too lethargic to currently participate in an interview at this time. Patient reported black stools over the previous week and a half that has been worsening. He discontinued his Eliquis on his own on 03/03/24 due to concern for possible GI bleed. Lab work prior to arrival showed him anemic at 6.5 and was sent to the ED. He endorsed generalized fatigue, weakness, shortness of breath on exertion, dizziness, mild nausea without vomiting. No hematemesis, no NSAID use and denies abdominal pain.   ED course: Patient alert and responsive on arrival, afebrile, normotensive with a HR of 71 and 93% on RA. Labs significant for anemia, MCV of 110, AKI with Cr. 1.44, hyponatremia at 129, and mild Transaminitis. 2 units of pRBC's ordered and the patient was started on Protonix. TRH consulted for admission.    Hospital Course: See significant hospital events for details.  Significant labs: (Labs/ Imaging personally reviewed) I, Jesse Norton, AGACNP-BC, personally viewed and interpreted this ECG. EKG Interpretation: Date: 03/13/24, EKG Time: 22:08, Rate: 127, Rhythm: ST, QRS Axis:  normal Intervals: normal, ST/T Wave abnormalities: non specific T wave abnormalities, Narrative Interpretation: ST Chemistry: Na+:138, K+: 3.7, BUN/Cr.: 48/ 1.51, Serum CO2/ AG: 24/9, Hematology: WBC: <0.1 , Hgb: 6.4, plt: 76 Troponin: 19 > 16, BNP: 104, Lactic/ PCT: 5.3/ pending  ABG: 7.47/ 29/ 108/ 21.1 CXR 03/20/2024: lowe lung volumes with increasing non specific patchy bibasilar opacity CT head 03/18/2024: no acute intracranial disease  PCCM consulted for  assistance in management and monitoring due to suspected severe sepsis with shock requiring vasopressor support.  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 Candidal esophagitis noted- Diflucan started 03/06/24: GI s/off 3/28: chemo treatment completed and tolerated well 03/08/24 -hgb stable. Restarting plavix due to h/o DES. Continue PT evaluation for goal to go home in next couple days 3/30:Experiencing weakness/nausea in post-chemo setting.   3/31. Restarted plavix due to h/o DES. Continue PT evaluation for goal to go home in next couple day  03/12/24: Patient is seen and examined today morning. He is sitting in chair. Feels better than yesterday, worked with PT. hemoglobin came back 6.3.  He denies active bleeding.  03/13/24: large bloody bowel mvmt. Plavix d/c'd. Hgb 7.0. overnight rapid response with transfer to ICU for syncopal episode. CTH negative, Hgb dropped to 6.4. 2 units of blood transfused. Low grade fever, sepsis w/u initiated, severe shock with multiple vasopressors started. PCCM consulted  Interim History / Subjective:  Patient's mentation waxing and waning with vitals. No current complaints, intermittently following commands  Objective   Blood pressure (!) 70/41, pulse (!) 124, temperature 100.1 F (37.8 C), temperature source Oral, resp. rate 16, height 5\' 10"  (1.778 m), weight 109.7 kg, SpO2 (!) 86%.    FiO2 (%):  [  100 %] 100 %   Intake/Output Summary (Last 24 hours) at 04/06/2024 0513 Last data filed at 04/03/2024 0500 Gross per 24 hour  Intake 1622.23 ml  Output  200 ml  Net 1422.23 ml   Filed Weights   03/12/24 0402 03/13/24 0329 03/30/2024 0400  Weight: 112 kg 111 kg 109.7 kg    Examination: General: Adult male, critically ill, lying in bed, NAD HEENT: MM pink/moist, anicteric, atraumatic, neck supple Neuro: RASS: -1 to -2, follows simple intermittent commands, PERRL +3, generalized weakness CV: s1s2 RRR, ST on monitor, no r/m/g Pulm: Regular,moderately labored on NRB- switching to BIPAP, breath sounds coarse crackles-BUL & diminished-BLL GI: soft, distended, non tender, bs x 4 Skin: limited exam- no rashes/lesions noted Extremities: warm/dry, pulses + 2 R/P, +1, to 2 generalized edema noted  Resolved Hospital Problem list     Assessment & Plan:  Suspected Severe Sepsis with shock due to Candidal esophagitis & suspected HCAP Leukopenia Lactic: 5.3, Baseline PCT: pending, UA: pending Initial interventions/workup included: 500 L of NS/LR due to signs of overload & Cefepime/ Vancomycin/ Metronidazole - Supplemental oxygen as needed, to maintain SpO2 > 90% - f/u cultures, trend lactic/ PCT - Daily CBC, monitor WBC/ fever curve - IV antibiotics: cefepime, flagyl & vancomycin with Diflucan IV - conservative IVF hydration, due to blood transfusion and signs of overload - Initiate vasopressors to maintain MAP< 65: norepinephrine & vasopressin - change daily prednisone to stress dose steroids: hydrocortisone 100 q 8 h - discontinue lopressor    Acute Hypoxic Respiratory Failure secondary to suspected HCAP vs flash pulmonary edema PMHx: COPD - Initiate BIPAP, wean FiO2 as tolerated - Supplemental O2 to maintain SpO2 > 90% - Intermittent chest x-ray & ABG PRN - Ensure adequate pulmonary hygiene  - F/u cultures, trend PCT - bronchodilators PRN  Symptomatic Acute on Chronic Anemia suspect secondary to GI bleed in the setting of Chronic Disease Upper GIB EGD 3/26: gastric antrum erosions, duodenal ulcers- none actively bleeding, no biopsies  were collected.  H.Pylori negative Last bloody bowel mvmt on 03/13/24. Received 2 units pRBC's overnight for a total of 7 units since admission - continue to hold Eliquis & Plavix - Monitor for s/s of bleeding - Daily CBC, serial H&H - Transfuse for Hgb <8 - continue PPI BID  Acute Kidney Injury - worsening Hyponatremia - improved Baseline Cr: 0.89, Cr on admission:1.44 - Strict I/O's: alert provider if UOP < 0.5 mL/kg/hr - Daily BMP, replace electrolytes PRN - Avoid nephrotoxic agents as able, ensure adequate renal perfusion - consider renal US  DLBCL Upper Extremity US negative for DVT. Positive for lymphadenopathy - Oncology following, appreciate input - monitor for tumor lysis syndrome, daily uric acid - continue allopurinol once able to tolerate PO, continue Acyclovir  CAD Recent Atrial Fibrillation diagnosis HFpEF Pmhx: HTN - Plavix, Eliquis, metoprolol, amiodarone on hold - continuous cardiac monitoring   Best Practice (right click and "Reselect all SmartList Selections" daily)  Diet/type: NPO DVT prophylaxis SCD Pressure ulcer(s): N/A GI prophylaxis: PPI Lines: Central line and yes and it is still needed Foley:  Yes, and it is still needed Code Status:  full code Last date of multidisciplinary goals of care discussion [03/13/24]  Labs   CBC: Recent Labs  Lab 03/10/24 0510 03/11/24 0642 03/12/24 1025 03/13/24 0602 03/13/24 2229  WBC 11.3* 4.1 1.2* 0.8* <0.1*  NEUTROABS  --   --  0.5*  --  0.0*  HGB 7.3* 7.9* 6.3* 7.0* 6.4*  HCT 21.5* 23.3*  19.1* 21.5* 18.7*  MCV 101.4* 101.3* 104.9* 101.4* 97.4  PLT 261 205 148* 120* 76*    Basic Metabolic Panel: Recent Labs  Lab 03/11/24 0642 03/12/24 1020 03/13/24 0602 03/13/24 2229 04/02/2024 0437  NA 135 135 141 137 138  K 3.8 3.9 5.4* 4.1 3.7  CL 102 102 107 104 105  CO2 25 25 22 22 24   GLUCOSE 105* 192* 196* 156* 95  BUN 37* 40* 40* 46* 48*  CREATININE 1.07 1.02 1.20 1.29* 1.51*  CALCIUM 8.4* 8.2* 8.8*  8.3* 8.1*  MG  --   --   --  1.6*  --   PHOS  --   --   --  1.8*  --    GFR: Estimated Creatinine Clearance: 49.2 mL/min (A) (by C-G formula based on SCr of 1.51 mg/dL (H)). Recent Labs  Lab 03/11/24 0642 03/12/24 1025 03/13/24 0602 03/13/24 2229  WBC 4.1 1.2* 0.8* <0.1*    Liver Function Tests: Recent Labs  Lab 03/12/24 1020 03/13/24 0602 03/13/24 2229 03/16/2024 0437  AST 25 35 32 28  ALT 16 18 20 15   ALKPHOS 46 49 42 34*  BILITOT 0.4 0.9 0.6 0.7  PROT 4.4* 4.9* 4.3* 3.9*  ALBUMIN 2.1* 2.2* 2.0* 2.1*   No results for input(s): "LIPASE", "AMYLASE" in the last 168 hours. No results for input(s): "AMMONIA" in the last 168 hours.  ABG    Component Value Date/Time   PHART 7.47 (H) 03/13/2024 2220   PCO2ART 29 (L) 03/13/2024 2220   PO2ART 108 03/13/2024 2220   HCO3 21.1 03/13/2024 2220   ACIDBASEDEF 1.5 03/13/2024 2220   O2SAT 99.2 03/13/2024 2220     Coagulation Profile: No results for input(s): "INR", "PROTIME" in the last 168 hours.  Cardiac Enzymes: No results for input(s): "CKTOTAL", "CKMB", "CKMBINDEX", "TROPONINI" in the last 168 hours.  HbA1C: HB A1C (BAYER DCA - WAIVED)  Date/Time Value Ref Range Status  09/15/2021 10:45 AM 5.9 (H) 4.8 - 5.6 % Final    Comment:             Prediabetes: 5.7 - 6.4          Diabetes: >6.4          Glycemic control for adults with diabetes: <7.0               **Please note reference interval change**    Hgb A1c MFr Bld  Date/Time Value Ref Range Status  08/28/2023 11:31 AM 6.0 (H) 4.8 - 5.6 % Final    Comment:             Prediabetes: 5.7 - 6.4          Diabetes: >6.4          Glycemic control for adults with diabetes: <7.0   01/26/2023 09:46 AM 5.8 (H) 4.8 - 5.6 % Final    Comment:             Prediabetes: 5.7 - 6.4          Diabetes: >6.4          Glycemic control for adults with diabetes: <7.0     CBG: Recent Labs  Lab 03/13/24 2156 03/27/2024 0332  GLUCAP 156* 89    Review of Systems:   UTA- unable  to participate in interview at this time Past Medical History:  He,  has a past medical history of (HFpEF) heart failure with preserved ejection fraction (HCC) (01/21/2019), Anxiety, Aortic atherosclerosis (HCC), Ascending aorta  dilatation (HCC) (07/16/2023), Barrett's esophagus, BPH (benign prostatic hyperplasia), CAD (coronary artery disease) (10/2018), Chronic heart failure with preserved ejection fraction (HCC), Colon polyps, Duodenal adenoma, Gastritis, GERD (gastroesophageal reflux disease), Gout, H. pylori infection, H/O urethral stricture, Hemorrhoids, Hiatal hernia, History of bilateral cataract extraction (2023), History of splenectomy (1957), Hypertension, Mucosa-associated lymphoid tissue (MALT) lymphoma, Pre-diabetes, Pulmonary emphysema (HCC), and Stomach ulcer.   Surgical History:   Past Surgical History:  Procedure Laterality Date   CATARACT EXTRACTION W/PHACO Left 03/07/2022   Procedure: CATARACT EXTRACTION PHACO AND INTRAOCULAR LENS PLACEMENT (IOC) LEFT VIVITY TORIC LENS 10.41 00:55.7;  Surgeon: Galen Manila, MD;  Location: [facility];  Service: Ophthalmology;  Laterality: Left;   CATARACT EXTRACTION W/PHACO Right 03/28/2022   Procedure: CATARACT EXTRACTION PHACO AND INTRAOCULAR LENS PLACEMENT (IOC) RIGHT VIVITY LENS 6.68 00:50.0;  Surgeon: Galen Manila, MD;  Location: [facility];  Service: Ophthalmology;  Laterality: Right;   COLONOSCOPY     COLONOSCOPY N/A 06/19/2022   Procedure: COLONOSCOPY;  Surgeon: Jaynie Collins, DO;  Location: [facility] ENDOSCOPY;  Service: Gastroenterology;  Laterality: N/A;   COLONOSCOPY WITH PROPOFOL N/A 08/20/2018   Procedure: COLONOSCOPY WITH PROPOFOL;  Surgeon: Christena Deem, MD;  Location: [facility] ENDOSCOPY;  Service: Endoscopy;  Laterality: N/A;   COLONOSCOPY WITH PROPOFOL N/A 12/19/2018   Procedure: COLONOSCOPY WITH PROPOFOL;  Surgeon: Christena Deem, MD;  Location: [facility] ENDOSCOPY;  Service: Endoscopy;   Laterality: N/A;   CORONARY ATHERECTOMY N/A 02/03/2019   Procedure: CORONARY ATHERECTOMY;  Surgeon: Yvonne Kendall, MD;  Location: [facility] INVASIVE CV LAB;  Service: Cardiovascular;  Laterality: N/A;   CORONARY STENT INTERVENTION N/A 01/21/2019   Procedure: CORONARY STENT INTERVENTION;  Surgeon: Yvonne Kendall, MD;  Location: [facility] INVASIVE CV LAB;  Service: Cardiovascular;  Laterality: N/A;   CORONARY STENT INTERVENTION N/A 02/03/2019   Procedure: CORONARY STENT INTERVENTION;  Surgeon: Yvonne Kendall, MD;  Location: [facility]VASIVE CV LAB;  Service: Cardiovascular;  Laterality: N/A;   CORONARY ULTRASOUND/IVUS N/A 02/03/2019   Procedure: Intravascular Ultrasound/IVUS;  Surgeon: Yvonne Kendall, MD;  Location: [facility]VASIVE CV LAB;  Service: Cardiovascular;  Laterality: N/A;   CYSTOSCOPY WITH URETHRAL DILATATION N/A 08/16/2018   Procedure: CYSTOSCOPY WITH URETHRAL DILATATION;  Surgeon: Sondra Come, MD;  Location: [facility] ORS;  Service: Urology;  Laterality: N/A;   ESOPHAGOGASTRODUODENOSCOPY N/A 01/29/2017   Procedure: ESOPHAGOGASTRODUODENOSCOPY (EGD);  Surgeon: Christena Deem, MD;  Location: [facility] ENDOSCOPY;  Service: Endoscopy;  Laterality: N/A;   ESOPHAGOGASTRODUODENOSCOPY N/A 06/19/2022   Procedure: ESOPHAGOGASTRODUODENOSCOPY (EGD);  Surgeon: Jaynie Collins, DO;  Location: [facility] ENDOSCOPY;  Service: Gastroenterology;  Laterality: N/A;   ESOPHAGOGASTRODUODENOSCOPY N/A 03/05/2024   Procedure: EGD (ESOPHAGOGASTRODUODENOSCOPY);  Surgeon: [name], MD;  Location: [facility] ENDOSCOPY;  Service: Endoscopy;  Laterality: N/A;   ESOPHAGOGASTRODUODENOSCOPY (EGD) WITH PROPOFOL N/A 07/10/2016   Procedure: ESOPHAGOGASTRODUODENOSCOPY (EGD) WITH PROPOFOL;  Surgeon: Christena Deem, MD;  Location: [facility] ENDOSCOPY;  Service: Endoscopy;  Laterality: N/A;   ESOPHAGOGASTRODUODENOSCOPY (EGD) WITH PROPOFOL N/A 12/19/2018   Procedure: ESOPHAGOGASTRODUODENOSCOPY (EGD) WITH PROPOFOL;  Surgeon: Christena Deem,  MD;  Location: [facility] ENDOSCOPY;  Service: Endoscopy;  Laterality: N/A;   left elbow repair Left    LEFT HEART CATH AND CORONARY ANGIOGRAPHY Left 01/21/2019   Procedure: LEFT HEART CATH AND CORONARY ANGIOGRAPHY;  Surgeon: Yvonne Kendall, MD;  Location: [facility] INVASIVE CV LAB;  Service: Cardiovascular;  Laterality: Left;   LYMPH NODE DISSECTION Left 02/08/2024   Procedure: LYMPH NODE DISSECTION;  Surgeon: Carolan Shiver, MD;  Location: [facility] ORS;  Service: General;  Laterality: Left;   PORTACATH PLACEMENT N/A 02/13/2024   Procedure: INSERTION, TUNNELED CENTRAL VENOUS DEVICE, WITH PORT;  Surgeon: Carolan Shiver, MD;  Location: [facility] ORS;  Service: General;  Laterality: N/A;   SPLENECTOMY  1957   TONSILLECTOMY     as a child     Social History:   reports that he has never smoked. He has never used smokeless tobacco. He reports that he does not currently use alcohol. He reports that he does not use drugs.   Family History:  His family history includes Heart attack (age of onset: 40) in his mother; Hyperlipidemia in his brother and sister; Hypertension in his brother and sister; Pancreatic cancer in his father. There is no history of Prostate cancer, Kidney cancer, or Bladder Cancer. He was adopted.   Allergies No Known Allergies   Home Medications  Prior to Admission medications   Medication Sig Start Date End Date Taking? Authorizing Provider  acyclovir (ZOVIRAX) 400 MG tablet Take 1 tablet (400 mg total) by mouth 2 (two) times daily. 02/19/24   Earna Coder, MD  allopurinol (ZYLOPRIM) 300 MG tablet Take 1 tablet (300 mg total) by mouth 2 (two) times daily. 02/11/24   Earna Coder, MD  amiodarone (PACERONE) 200 MG tablet Take 2 tablets (400 mg total) by mouth 2 (two) times daily for 5 days, THEN 1 tablet (200 mg total) daily. 02/29/24 04/04/24  Pennie Banter, DO  apixaban (ELIQUIS) 5 MG TABS tablet Take 1 tablet (5 mg total) by mouth 2 (two) times daily. Patient  not taking: Reported on 03/04/2024 02/29/24   Esaw Grandchild A, DO  atorvastatin (LIPITOR) 40 MG tablet TAKE 1 TABLET(40 MG) BY MOUTH DAILY 01/26/23   Aura Dials T, NP  azithromycin (ZITHROMAX) 250 MG tablet Take one tablet (500 mg) by mouth once daily for 2 days Patient not taking: Reported on 03/04/2024 03/01/24   Esaw Grandchild A, DO  Blood Glucose Monitoring Suppl (ONETOUCH VERIO) w/Device KIT To check blood sugar twice a day with goal fasting in morning <130 and goal 2 hours after meal <180.  Write all checks down for provider visits. 02/11/24   Cannady, Corrie Dandy T, NP  clobetasol ointment (TEMOVATE) 0.05 % Apply topically as needed. 01/20/22   [provider]  feeding supplement (ENSURE ENLIVE / ENSURE PLUS) LIQD Take 237 mLs by mouth 2 (two) times daily between meals. 02/29/24   Pennie Banter, DO  FLUoxetine (PROZAC) 10 MG tablet Take 1 tablet (10 mg total) by mouth daily. 02/26/24   Cannady, Corrie Dandy T, NP  furosemide (LASIX) 20 MG tablet Take 1 tablet (20 mg total) by mouth daily. 03/01/24   Esaw Grandchild A, DO  glucose blood test strip To check blood sugar twice a day with goal fasting in morning <130 and goal 2 hours after meal <180.  Write all checks down for provider visits. 02/11/24   Cannady, Corrie Dandy T, NP  Lancets (ONETOUCH ULTRASOFT) lancets 1 each by Other route 2 (two) times daily. 02/13/24   Cannady, Corrie Dandy T, NP  lidocaine (LIDODERM) 5 % Place 1 patch onto the skin daily. Remove & Discard patch within 12 hours or as directed by MD 02/26/24   Aura Dials T, NP  lidocaine-prilocaine (EMLA) cream Apply on the port. 30 -45 min  prior to port access. 02/11/24   Earna Coder, MD  metoprolol tartrate (LOPRESSOR) 50 MG tablet Take 1 tablet (50 mg total) by mouth 2 (two) times daily. 02/29/24   Esaw Grandchild  A, DO  nitroGLYCERIN (NITROSTAT) 0.4 MG SL tablet Place 1 tablet (0.4 mg total) under the tongue every 5 (five) minutes as needed for chest pain. Maximum of 3 doses.  08/25/20   End, Cristal Deer, MD  ondansetron (ZOFRAN) 8 MG tablet One pill every 8 hours as needed for nausea/vomitting. 02/11/24   Earna Coder, MD  oxyCODONE (OXY IR/ROXICODONE) 5 MG immediate release tablet Take 1 tablet (5 mg total) by mouth every 4 (four) hours as needed for severe pain (pain score 7-10). 02/29/24   Pennie Banter, DO  prochlorperazine (COMPAZINE) 10 MG tablet Take 1 tablet (10 mg total) by mouth every 6 (six) hours as needed for nausea or vomiting. 02/11/24   Earna Coder, MD     Critical care time: 70 minutes       Betsey Holiday, AGACNP-BC Acute Care Nurse Practitioner [facility]   [phone] / [phone] Please see Amion for details.

## 2024-04-10 NOTE — Consult Note (Signed)
 Palliative Medicine [facility] at [facility] [phone] [phone]   Name: Jesse Norton Date: 04/02/2024 MRN: [id number]  DOB: [birth year]  Patient Care Team: Marjie Skiff, NP as PCP - General (Nurse Practitioner) End, Cristal Deer, MD as PCP - Cardiology (Cardiology) Minor, Theadora Rama, RN (Inactive) as [facility] Care Management Donneta Romberg, Worthy Flank, MD as Consulting Physician (Oncology) Jaynie Collins, DO as Consulting Physician (Gastroenterology)    REASON FOR CONSULTATION: Jesse Norton is a 78 y.o. male with multiple medical problems including A-fib on Eliquis, diffuse large B-cell lymphoma, HFpEF, CAD status post PCI, Barrett's esophagitis, COPD.  Patient was recently hospitalized with A-fib with RVR started on Eliquis.  He was readmitted to hospital with melena.  Patient acutely declined on 04/09/2024 requiring transfer to the ICU where patient was subsequently intubated.  Palliative care consulted to address goals.  SOCIAL HISTORY:     reports that he has never smoked. He has never used smokeless tobacco. He reports that he does not currently use alcohol. He reports that he does not use drugs.  Patient is married and lives at home with his wife.  Has a son who lives nearby and another son in [place].  ADVANCE DIRECTIVES:  None on file  CODE STATUS: DNR  PAST MEDICAL HISTORY: Past Medical History:  Diagnosis Date   (HFpEF) heart failure with preserved ejection fraction (HCC) 01/21/2019   a.) LHC 01/21/2019: EF 55-60%, LVEDP 25-30 mmHg   Anxiety    Aortic atherosclerosis (HCC)    Ascending aorta dilatation (HCC) 07/16/2023   a.) TTE 07/16/2023: 38 mm   Barrett's esophagus    BPH (benign prostatic hyperplasia)    CAD (coronary artery disease) 10/2018   a.) ETT 11/06/2018: 1mm horizontal inflat ST depression, freq PVC's -> intermediate risk; b.) cCTA 12/23/2018: Ca2+ 2408 (93rd %'ile); FFR 0.6  mLAD, 0.87 mLCx, 0.77 dLCx --> cath recommended; c.) LHC/PCI 01/01/2019:  40% pLAD, 85% mLAD, 70% D2, 70% pLCx (2.75 x 15 mm Resolute Onyx DES), RCA min irregs -- staged PCI; d.) staged PCI 01/21/2019: orbital athrectomy + 3.0 x 15 mm Resolute Onyx DES mLAD   Chronic heart failure with preserved ejection fraction (HCC)    Colon polyps    Duodenal adenoma    Gastritis    GERD (gastroesophageal reflux disease)    Gout    H. pylori infection    H/O urethral stricture    Hemorrhoids    Hiatal hernia    History of bilateral cataract extraction 2023   History of splenectomy 1957   Hypertension    Mucosa-associated lymphoid tissue (MALT) lymphoma    Pre-diabetes    Pulmonary emphysema (HCC)    Stomach ulcer     PAST SURGICAL HISTORY:  Past Surgical History:  Procedure Laterality Date   CATARACT EXTRACTION W/PHACO Left 03/07/2022   Procedure: CATARACT EXTRACTION PHACO AND INTRAOCULAR LENS PLACEMENT (IOC) LEFT VIVITY TORIC LENS 10.41 00:55.7;  Surgeon: Galen Manila, MD;  Location: [facility];  Service: Ophthalmology;  Laterality: Left;   CATARACT EXTRACTION W/PHACO Right 03/28/2022   Procedure: CATARACT EXTRACTION PHACO AND INTRAOCULAR LENS PLACEMENT (IOC) RIGHT VIVITY LENS 6.68 00:50.0;  Surgeon: Galen Manila, MD;  Location: [facility];  Service: Ophthalmology;  Laterality: Right;   COLONOSCOPY     COLONOSCOPY N/A 06/19/2022   Procedure: COLONOSCOPY;  Surgeon: Jaynie Collins, DO;  Location: [facility] ENDOSCOPY;  Service: Gastroenterology;  Laterality: N/A;   COLONOSCOPY WITH PROPOFOL N/A  08/20/2018   Procedure: COLONOSCOPY WITH PROPOFOL;  Surgeon: Christena Deem, MD;  Location: [facility] ENDOSCOPY;  Service: Endoscopy;  Laterality: N/A;   COLONOSCOPY WITH PROPOFOL N/A 12/19/2018   Procedure: COLONOSCOPY WITH PROPOFOL;  Surgeon: Christena Deem, MD;  Location: [facility] ENDOSCOPY;  Service: Endoscopy;  Laterality: N/A;   CORONARY ATHERECTOMY N/A 02/03/2019    Procedure: CORONARY ATHERECTOMY;  Surgeon: Yvonne Kendall, MD;  Location: [facility] INVASIVE CV LAB;  Service: Cardiovascular;  Laterality: N/A;   CORONARY STENT INTERVENTION N/A 01/21/2019   Procedure: CORONARY STENT INTERVENTION;  Surgeon: Yvonne Kendall, MD;  Location: [facility] INVASIVE CV LAB;  Service: Cardiovascular;  Laterality: N/A;   CORONARY STENT INTERVENTION N/A 02/03/2019   Procedure: CORONARY STENT INTERVENTION;  Surgeon: Yvonne Kendall, MD;  Location: [facility];  Service: Cardiovascular;  Laterality: N/A;   CORONARY ULTRASOUND/IVUS N/A 02/03/2019   Procedure: Intravascular Ultrasound/IVUS;  Surgeon: Yvonne Kendall, MD;  Location: [facility];  Service: Cardiovascular;  Laterality: N/A;   CYSTOSCOPY WITH URETHRAL DILATATION N/A 08/16/2018   Procedure: CYSTOSCOPY WITH URETHRAL DILATATION;  Surgeon: Sondra Come, MD;  Location: [facility] ORS;  Service: Urology;  Laterality: N/A;   ESOPHAGOGASTRODUODENOSCOPY N/A 01/29/2017   Procedure: ESOPHAGOGASTRODUODENOSCOPY (EGD);  Surgeon: Christena Deem, MD;  Location: [facility] ENDOSCOPY;  Service: Endoscopy;  Laterality: N/A;   ESOPHAGOGASTRODUODENOSCOPY N/A 06/19/2022   Procedure: ESOPHAGOGASTRODUODENOSCOPY (EGD);  Surgeon: Jaynie Collins, DO;  Location: [facility] ENDOSCOPY;  Service: Gastroenterology;  Laterality: N/A;   ESOPHAGOGASTRODUODENOSCOPY N/A 03/05/2024   Procedure: EGD (ESOPHAGOGASTRODUODENOSCOPY);  Surgeon: [name], MD;  Location: [facility] ENDOSCOPY;  Service: Endoscopy;  Laterality: N/A;   ESOPHAGOGASTRODUODENOSCOPY (EGD) WITH PROPOFOL N/A 07/10/2016   Procedure: ESOPHAGOGASTRODUODENOSCOPY (EGD) WITH PROPOFOL;  Surgeon: Christena Deem, MD;  Location: [facility] ENDOSCOPY;  Service: Endoscopy;  Laterality: N/A;   ESOPHAGOGASTRODUODENOSCOPY (EGD) WITH PROPOFOL N/A 12/19/2018   Procedure: ESOPHAGOGASTRODUODENOSCOPY (EGD) WITH PROPOFOL;  Surgeon: Christena Deem, MD;  Location: [facility] ENDOSCOPY;  Service: Endoscopy;   Laterality: N/A;   left elbow repair Left    LEFT HEART CATH AND CORONARY ANGIOGRAPHY Left 01/21/2019   Procedure: LEFT HEART CATH AND CORONARY ANGIOGRAPHY;  Surgeon: Yvonne Kendall, MD;  Location: [facility] INVASIVE CV LAB;  Service: Cardiovascular;  Laterality: Left;   LYMPH NODE DISSECTION Left 02/08/2024   Procedure: LYMPH NODE DISSECTION;  Surgeon: Carolan Shiver, MD;  Location: [facility] ORS;  Service: General;  Laterality: Left;   PORTACATH PLACEMENT N/A 02/13/2024   Procedure: INSERTION, TUNNELED CENTRAL VENOUS DEVICE, WITH PORT;  Surgeon: Carolan Shiver, MD;  Location: [facility] ORS;  Service: General;  Laterality: N/A;   SPLENECTOMY  1957   TONSILLECTOMY     as a child    HEMATOLOGY/ONCOLOGY HISTORY:  Oncology History Overview Note  # NOV 2019- MALT [II opinion at [facility] path]; s/p sigmoid colon polyp resection [Dr.Skulskie; incidental]; PET scan -NED  # Barrett's esophagus- 2018;#Colonic polyps surveillance/Dr. Marva Panda ; s/p  EGD & Colo- Jan 2020  FEB 20th, 2025-  Numerous new bulky lymph nodes, predominantly in the neck and chest although also present in the abdomen and pelvis, consistent with recurrent lymphoma. 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  Left Axillary excisional lymph node biopsy on 2/28- Path- Discussed with pathology Dr.Smir-pathologically highly suspicious of EBV associated diffuse large B-cell lymphoma.  However B-cell clonality/and also FISH panel pending.  ABC subtype. Once path is confirmed proceed  with Pola-RCHP.  Hepatitis panel negative-EBV IgG positive however IgM negative.    # MARCH 12th, 2025- Rituxan-Prednisone.   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) (HCC)   02/13/2024 Cancer Staging   Staging form: Hodgkin and Non-Hodgkin Lymphoma, AJCC 8th Edition - Clinical: Stage III - Signed by Earna Coder, MD on 02/13/2024   02/20/2024 - 02/20/2024 Chemotherapy   Patient is on Treatment Plan : NON-HODGKINS LYMPHOMA R-CHOP q21d     DLBCL (diffuse large B cell lymphoma) (HCC)  02/19/2024 Initial Diagnosis   DLBCL (diffuse large B cell lymphoma) (HCC)   02/19/2024 Cancer Staging   Staging form: Hodgkin and Non-Hodgkin Lymphoma, AJCC 8th Edition - Clinical: Stage III (Diffuse large B-cell lymphoma) - Signed by Earna Coder, MD on 02/19/2024   03/06/2024 -  Chemotherapy   Patient is on Treatment Plan : NON-HODGKINS LYMPHOMA POLA-R-CHP D1 X 6 cycles / Rituximab D1 q21d X 2 cycles       ALLERGIES:  has no known allergies.  MEDICATIONS:  Current Facility-Administered Medications  Medication Dose Route Frequency Provider Last Rate Last Admin   acetaminophen (TYLENOL) tablet 650 mg  650 mg Oral Q6H PRN Verdene Lennert, MD   650 mg at 03/04/24 2018   Or   acetaminophen (TYLENOL) suppository 650 mg  650 mg Rectal Q6H PRN Verdene Lennert, MD       acyclovir (ZOVIRAX) 200 MG capsule 400 mg  400 mg Oral BID Verdene Lennert, MD   400 mg at 03/12/2024 0933   albumin human 25 % solution 25 g  25 g Intravenous Q6H Manuela Schwartz, NP 60 mL/hr at 04/05/2024 0952 25 g at 04/06/2024 0952   allopurinol (ZYLOPRIM) tablet 300 mg  300 mg Oral BID Verdene Lennert, MD   300 mg at 03/25/2024 0934   ceFEPIme (MAXIPIME) 2 g in sodium chloride 0.9 % 100 mL IVPB  2 g Intravenous Q12H Otelia Sergeant, RPH   Stopped at 03/12/2024 7829   Chlorhexidine Gluconate Cloth 2 % PADS 6 each  6 each Topical Q0600 Leeroy Bock, MD   6 each at 03/26/2024 0845   docusate (COLACE) 50 MG/5ML liquid 100 mg  100 mg Per Tube BID Raechel Chute, MD       EPINEPHrine  (ADRENALIN) 5 mg in NS 250 mL (0.02 mg/mL) premix infusion  0.5-20 mcg/min Intravenous Titrated Rust-Chester, Britton L, NP 60 mL/hr at 03/28/2024 0952 20 mcg/min at 03/24/2024 0952   feeding supplement (ENSURE ENLIVE / ENSURE PLUS) liquid 237 mL  237 mL Oral TID BM Leeroy Bock, MD   237 mL at 03/13/24 2031   filgrastim-aafi (NIVESTYM) injection 480 mcg  480 mcg Subcutaneous q1800 Louretta Shorten R, MD   480 mcg at 03/13/24 2332   guaiFENesin-dextromethorphan (ROBITUSSIN DM) 100-10 MG/5ML syrup 5 mL  5 mL Oral Q4H PRN Leeroy Bock, MD   5 mL at 03/13/24 0906   HYDROcodone-acetaminophen (NORCO/VICODIN) 5-325 MG per tablet 1-2 tablet  1-2 tablet Oral Q6H PRN Verdene Lennert, MD   2 tablet at 03/13/24 1642   hydrocortisone sodium succinate (SOLU-CORTEF) 100 MG injection 100 mg  100 mg Intravenous Q8H Rust-Chester, Britton L, NP   100 mg at 04/07/2024 0635   ketamine (KETALAR) adult IV infusion 1000mg /18mL (10 mg/mL-Premix)  0.5 mg/kg/hr Intravenous  Continuous Raechel Chute, MD       methylene blue (antidote) 110 mg in dextrose 5 % 50 mL IVPB  1 mg/kg Intravenous Once Judithe Modest, NP       metroNIDAZOLE (FLAGYL) IVPB 500 mg  500 mg Intravenous Q12H Rust-Chester, Cecelia Byars, NP   Stopped at 04/02/2024 0906   midodrine (PROAMATINE) tablet 10 mg  10 mg Oral TID WC Leeroy Bock, MD   10 mg at 03/20/2024 0754   mirtazapine (REMERON) tablet 7.5 mg  7.5 mg Oral QHS Leeroy Bock, MD   7.5 mg at 03/13/24 2028   multivitamin with minerals tablet 1 tablet  1 tablet Oral Daily Leeroy Bock, MD   1 tablet at 03/24/2024 1610   norepinephrine (LEVOPHED) 16 mg in (0.064 mg/mL) premix infusion  0-60 mcg/min Intravenous Titrated Rust-Chester, Britton L, NP 56.3 mL/hr at 03/17/2024 0952 60 mcg/min at 04/04/2024 0952   ondansetron (ZOFRAN) tablet 4 mg  4 mg Oral Q6H PRN Verdene Lennert, MD   4 mg at 03/09/24 0022   Or   ondansetron [facility]) injection 4 mg  4 mg Intravenous Q6H PRN  Verdene Lennert, MD   4 mg at 03/13/24 0528   pantoprazole (PROTONIX) injection 40 mg  40 mg Intravenous Q12H Manuela Schwartz, NP   40 mg at 04/09/2024 0854   polyethylene glycol (MIRALAX / GLYCOLAX) packet 17 g  17 g Per Tube Daily Raechel Chute, MD       prochlorperazine (COMPAZINE) injection 10 mg  10 mg Intravenous Q6H PRN Leeroy Bock, MD   10 mg at 03/07/24 1420   sodium chloride flush (NS) 0.9 % injection 3 mL  3 mL Intravenous Q12H Verdene Lennert, MD   3 mL at 03/27/2024 0846   vancomycin (VANCOREADY) IVPB 2000 mg/400 mL  2,000 mg Intravenous Once Otelia Sergeant, RPH 200 mL/hr at 04/06/2024 9604 Infusion Verify at 03/30/2024 5409   Followed by   Melene Muller ON 03/15/2024] vancomycin (VANCOREADY) IVPB 1250 mg/250 mL  1,250 mg Intravenous Q24H Otelia Sergeant, RPH       vasopressin (PITRESSIN) 20 Units in 100 mL (0.2 unit/mL) infusion-*FOR SHOCK*  0-0.04 Units/min Intravenous Continuous Rust-Chester, Cecelia Byars, NP 12 mL/hr at 03/28/2024 0952 0.04 Units/min at 03/21/2024 0952   voriconazole (VFEND) 500 mg in sodium chloride 0.9 % 100 mL IVPB  500 mg Intravenous Q12H Raechel Chute, MD       Followed by   Melene Muller ON 03/15/2024] voriconazole (VFEND) 350 mg in sodium chloride 0.9 % 100 mL IVPB  4 mg/kg (Adjusted) Intravenous Q12H Raechel Chute, MD        VITAL SIGNS: BP (!) 84/54   Pulse (!) 130   Temp 97.9 F (36.6 C) (Axillary)   Resp 19   Ht 5\' 10"  (1.778 m)   Wt 241 lb 13.5 oz (109.7 kg)   SpO2 (!) 79%   BMI 34.70 kg/m  Filed Weights   03/12/24 0402 03/13/24 0329 04/04/2024 0400  Weight: 246 lb 14.6 oz (112 kg) 244 lb 11.4 oz (111 kg) 241 lb 13.5 oz (109.7 kg)    Estimated body mass index is 34.7 kg/m as calculated from the following:   Height as of this encounter: 5\' 10"  (1.778 m).   Weight as of this encounter: 241 lb 13.5 oz (109.7 kg).  LABS: CBC:    Component Value Date/Time   WBC 0.1 (LL) 03/29/2024 0839   HGB 7.1 (L) 03/11/2024 0839   HGB 9.0 (L)  03/05/2024 0446   HCT 19.9  (L) 03/28/2024 0839   HCT 38.9 01/26/2023 0946   PLT 34 (L) 04/09/2024 0839   PLT 386 03/04/2024 1230   PLT 322 01/26/2023 0946   MCV 93.4 03/29/2024 0839   MCV 107 (H) 01/26/2023 0946   MCV 95 01/06/2014 1007   NEUTROABS 0.0 (LL) 03/13/2024 2229   NEUTROABS 2.5 01/26/2023 0946   LYMPHSABS 0.1 (L) 03/13/2024 2229   LYMPHSABS 2.4 01/26/2023 0946   MONOABS 0.0 (L) 03/13/2024 2229   EOSABS 0.0 03/13/2024 2229   EOSABS 0.5 (H) 01/26/2023 0946   BASOSABS 0.0 03/13/2024 2229   BASOSABS 0.2 01/26/2023 0946   Comprehensive Metabolic Panel:    Component Value Date/Time   NA 138 03/22/2024 0437   NA 141 08/28/2023 1131   NA 134 (L) 01/06/2014 1007   K 3.7 03/31/2024 0437   K 4.0 01/06/2014 1007   CL 105 03/20/2024 0437   CL 101 01/06/2014 1007   CO2 24 03/17/2024 0437   CO2 27 01/06/2014 1007   BUN 48 (H) 04/02/2024 0437   BUN 12 08/28/2023 1131   BUN 22 (H) 01/06/2014 1007   CREATININE 1.51 (H) 03/24/2024 0437   CREATININE 1.44 (H) 03/04/2024 1230   CREATININE 1.13 01/06/2014 1007   GLUCOSE 95 03/29/2024 0437   GLUCOSE 113 (H) 01/06/2014 1007   CALCIUM 8.1 (L) 03/12/2024 0437   CALCIUM 9.7 01/06/2014 1007   AST 28 04/06/2024 0437   AST 50 (H) 03/04/2024 1230   ALT 15 04/08/2024 0437   ALT 23 03/04/2024 1230   ALKPHOS 34 (L) 03/27/2024 0437   BILITOT 0.7 04/09/2024 0437   BILITOT 0.7 03/04/2024 1230   PROT 3.9 (L) 03/28/2024 0437   PROT 7.2 08/28/2023 1131   ALBUMIN 2.1 (L) 04/02/2024 0437   ALBUMIN 4.4 08/28/2023 1131    RADIOGRAPHIC STUDIES: DG Chest Port 1 View Result Date: 03/25/2024 CLINICAL DATA:  78 year old male with hypoxia.  Lymphoma. EXAM: PORTABLE CHEST 1 VIEW COMPARISON:  Portable chest 03/13/2024 and earlier. FINDINGS: Portable AP semi upright view at 0539 hours. Stable right chest Port-A-Cath. Lower lung volume since yesterday. Stable cardiac size and mediastinal contours. Visualized tracheal air column is within normal limits. No pneumothorax or pulmonary  edema no pleural effusion. But patchy increasing bibasilar opacity. No air bronchograms. No acute osseous abnormality identified. Visible bowel-gas remains within normal limits. IMPRESSION: Lower lung volumes with increasing nonspecific patchy bibasilar opacity. Electronically Signed   By: Odessa Fleming M.D.   On: 04/03/2024 05:53   CT HEAD WO CONTRAST ( ) Result Date: 04/03/2024 CLINICAL DATA:  Syncope EXAM: CT HEAD WITHOUT CONTRAST TECHNIQUE: Contiguous axial images were obtained from the base of the skull through the vertex without intravenous contrast. RADIATION DOSE REDUCTION: This exam was performed according to the departmental dose-optimization program which includes automated exposure control, adjustment of the mA and/or kV according to patient size and/or use of iterative reconstruction technique. COMPARISON:  None Available. FINDINGS: Brain: There is no mass, hemorrhage or extra-axial collection. The size and configuration of the ventricles and extra-axial CSF spaces are normal. There is hypoattenuation of the white matter, most commonly indicating chronic small vessel disease. Vascular: Atherosclerotic calcification of the vertebral and internal carotid arteries at the skull base. No abnormal hyperdensity of the major intracranial arteries or dural venous sinuses. Skull: The visualized skull base, calvarium and extracranial soft tissues are normal. Sinuses/Orbits: No fluid levels or advanced mucosal thickening of the visualized paranasal sinuses. No mastoid or middle ear  effusion. Normal orbits. Other: None. IMPRESSION: 1. No acute intracranial abnormality. 2. Chronic small vessel disease. Electronically Signed   By: Deatra Robinson M.D.   On: 03/30/2024 02:22   DG Chest Port 1 View Result Date: 03/13/2024 CLINICAL DATA:  Short of breath, history of lymphoma EXAM: PORTABLE CHEST 1 VIEW COMPARISON:  02/26/2024 FINDINGS: Single frontal view of the chest demonstrates a stable right chest wall port. The  cardiac silhouette is unremarkable. No acute airspace disease, effusion, or pneumothorax. Bilateral hilar prominence consistent with adenopathy on previous CT. No acute bony abnormalities. IMPRESSION: 1. Bilateral hilar prominence consistent with adenopathy seen on recent CT. 2. No acute airspace disease. Electronically Signed   By: Sharlet Salina M.D.   On: 03/13/2024 22:36   US Venous Img Upper Bilat (DVT) Result Date: 03/06/2024 CLINICAL DATA:  Bilateral upper extremity swelling. EXAM: BILATERAL UPPER EXTREMITY VENOUS DOPPLER ULTRASOUND TECHNIQUE: Gray-scale sonography with graded compression, as well as color Doppler and duplex ultrasound were performed to evaluate the bilateral upper extremity deep venous systems from the level of the subclavian vein and including the jugular, axillary, basilic, radial, ulnar and upper cephalic vein. Spectral Doppler was utilized to evaluate flow at rest and with distal augmentation maneuvers. COMPARISON:  February 27, 2024. FINDINGS: RIGHT UPPER EXTREMITY Internal Jugular Vein: No evidence of thrombus. Normal compressibility, respiratory phasicity and response to augmentation. Subclavian Vein: No evidence of thrombus. Normal compressibility, respiratory phasicity and response to augmentation. Axillary Vein: No evidence of thrombus. Normal compressibility, respiratory phasicity and response to augmentation. Cephalic Vein: No evidence of thrombus. Normal compressibility, respiratory phasicity and response to augmentation. Basilic Vein: No evidence of thrombus. Normal compressibility, respiratory phasicity and response to augmentation. Brachial Veins: No evidence of thrombus. Normal compressibility, respiratory phasicity and response to augmentation. Radial Veins: No evidence of thrombus. Normal compressibility, respiratory phasicity and response to augmentation. Ulnar Veins: No evidence of thrombus. Normal compressibility, respiratory phasicity and response to augmentation.  Venous Reflux:  None. Multiple enlarged lymph nodes are noted in right cervical region concerning for metastatic disease, the largest measuring 2.1 cm in minor axis. LEFT UPPER EXTREMITY Internal Jugular Vein: No evidence of thrombus. Normal compressibility, respiratory phasicity and response to augmentation. Subclavian Vein: No evidence of thrombus. Normal compressibility, respiratory phasicity and response to augmentation. Axillary Vein: No evidence of thrombus. Normal compressibility, respiratory phasicity and response to augmentation. Cephalic Vein: No evidence of thrombus. Normal compressibility, respiratory phasicity and response to augmentation. Basilic Vein: No evidence of thrombus. Normal compressibility, respiratory phasicity and response to augmentation. Brachial Veins: No evidence of thrombus. Normal compressibility, respiratory phasicity and response to augmentation. Radial Veins: No evidence of thrombus. Normal compressibility, respiratory phasicity and response to augmentation. Ulnar Veins: No evidence of thrombus. Normal compressibility, respiratory phasicity and response to augmentation. Venous Reflux:  None. Other Findings: Multiple enlarged left cervical lymph nodes are noted consistent with metastatic disease, the largest measuring 2.8 cm in minor axis. IMPRESSION: No evidence of DVT within either upper extremity. Bilateral cervical adenopathy is noted consistent with metastatic disease. Electronically Signed   By: Lupita Raider M.D.   On: 03/06/2024 14:43   US Venous Img Lower Unilateral Right (DVT) Result Date: 03/05/2024 CLINICAL DATA:  Right leg swelling. EXAM: RIGHT LOWER EXTREMITY VENOUS DOPPLER ULTRASOUND TECHNIQUE: Gray-scale sonography with graded compression, as well as color Doppler and duplex ultrasound were performed to evaluate the lower extremity deep venous systems from the level of the common femoral vein and including the common femoral, femoral, profunda femoral,  popliteal  and calf veins including the posterior tibial, peroneal and gastrocnemius veins when visible. The superficial great saphenous vein was also interrogated. Spectral Doppler was utilized to evaluate flow at rest and with distal augmentation maneuvers in the common femoral, femoral and popliteal veins. COMPARISON:  None Available. FINDINGS: Contralateral Common Femoral Vein: Respiratory phasicity is normal and symmetric with the symptomatic side. No evidence of thrombus. Normal compressibility. Common Femoral Vein: No evidence of thrombus. Normal compressibility, respiratory phasicity and response to augmentation. Saphenofemoral Junction: No evidence of thrombus. Normal compressibility and flow on color Doppler imaging. Profunda Femoral Vein: No evidence of thrombus. Normal compressibility and flow on color Doppler imaging. Femoral Vein: No evidence of thrombus. Normal compressibility, respiratory phasicity and response to augmentation. Popliteal Vein: No evidence of thrombus. Normal compressibility, respiratory phasicity and response to augmentation. Calf Veins: Visualized right deep calf veins are patent without thrombus. Other Findings:  None. IMPRESSION: Negative for deep venous thrombosis in right lower extremity. Electronically Signed   By: Richarda Overlie M.D.   On: 03/05/2024 15:29   US Venous Img Upper Uni Left (DVT) Result Date: 02/27/2024 CLINICAL DATA:  Swelling.  History of lymphoma EXAM: Left UPPER EXTREMITY VENOUS DOPPLER ULTRASOUND TECHNIQUE: Gray-scale sonography with graded compression, as well as color Doppler and duplex ultrasound were performed to evaluate the upper extremity deep venous system from the level of the subclavian vein and including the jugular, axillary, basilic, radial, ulnar and upper cephalic vein. Spectral Doppler was utilized to evaluate flow at rest and with distal augmentation maneuvers. COMPARISON:  None Available. FINDINGS: Contralateral Subclavian Vein: Respiratory phasicity is  normal and symmetric with the symptomatic side. No evidence of thrombus. Normal compressibility. Internal Jugular Vein: No evidence of thrombus. Normal compressibility, respiratory phasicity and response to augmentation. Subclavian Vein: No evidence of thrombus. Normal compressibility, respiratory phasicity and response to augmentation. Axillary Vein: No evidence of thrombus. Normal compressibility, respiratory phasicity and response to augmentation. Cephalic Vein: No evidence of thrombus. Normal compressibility, respiratory phasicity and response to augmentation. Basilic Vein: No evidence of thrombus. Normal compressibility, respiratory phasicity and response to augmentation. Brachial Veins: No evidence of thrombus. Normal compressibility, respiratory phasicity and response to augmentation. Radial Veins: No evidence of thrombus. Normal compressibility, respiratory phasicity and response to augmentation. Ulnar Veins: No evidence of thrombus. Normal compressibility, respiratory phasicity and response to augmentation. Venous Reflux:  None visualized. Other Findings: Note is made of numerous abnormal nodes identified in the visualized neck. Please correlate with prior workup. Scattered soft tissue edema as well along the forearm. IMPRESSION: No evidence of DVT within the left upper extremity. Electronically Signed   By: Karen Kays M.D.   On: 02/27/2024 14:52   DG Lumbar Spine 2-3 Views Result Date: 02/26/2024 CLINICAL DATA:  Back pain EXAM: LUMBAR SPINE - 2-3 VIEW COMPARISON:  CT 01/31/2024 FINDINGS: Degenerative disc disease most pronounced at L5-S1 with disc space narrowing, spurring, and vacuum disc. Diffuse degenerative facet disease, most pronounced in the lower lumbar spine. Normal alignment. No fracture. IMPRESSION: Degenerative disc and facet disease.  No acute bony abnormality. Electronically Signed   By: Charlett Nose M.D.   On: 02/26/2024 23:27   CT Angio Chest PE W/Cm &/Or Wo Cm Result Date:  02/26/2024 CLINICAL DATA:  PE suspected. High probability. * Tracking Code: BO * EXAM: CT ANGIOGRAPHY CHEST WITH CONTRAST TECHNIQUE: Multidetector CT imaging of the chest was performed using the standard protocol during bolus administration of intravenous contrast. Multiplanar CT image reconstructions and MIPs were obtained  to evaluate the vascular anatomy. RADIATION DOSE REDUCTION: This exam was performed according to the departmental dose-optimization program which includes automated exposure control, adjustment of the mA and/or kV according to patient size and/or use of iterative reconstruction technique. CONTRAST:  75mL OMNIPAQUE IOHEXOL 350 MG/ML SOLN COMPARISON:  PET-CT 02/05/2024 FINDINGS: Cardiovascular: No filling defects within the pulmonary arteries to suggest acute pulmonary embolism. Mediastinum/Nodes: Bulky mediastinum and LEFT axillary lymphadenopathy. RIGHT lower paratracheal node measures 3.4 cm short axis compared to 3.1 cm on PET-CT. LEFT supraclavicular node measures 3.2 cm compared to 2.3 cm. LEFT axillary nodal mass with bulky nodes. Individual node measures up to 3.4 cm compared to 2.3 cm. Node beneath the pectoralis muscle measures 2.7 cm compared to 2.0 cm. Visually nodes appear larger. Lungs/Pleura: Interstitial edema pattern. no  Infarction.  No pneumonia. 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 Multiple small spleen spanning diaphragm. Adrenal glands normal. Musculoskeletal: No aggressive osseous lesion. Review of the MIP images confirms the above findings. IMPRESSION: 1. No acute pulmonary embolism. 2. No acute pulmonary parenchymal findings. 3. mild interstitial edema pattern. 4. Bulky mediastinal, supraclavicular and LEFT axillary adenopathy. The bulky LEFT axillary adenopathy is increased in size. Electronically Signed   By: Genevive Bi M.D.   On: 02/26/2024 17:07   DG Chest Port 1 View Result Date:  02/26/2024 CLINICAL DATA:  AFib with RVR EXAM: PORTABLE CHEST 1 VIEW COMPARISON:  Chest radiograph dated 02/13/2024 FINDINGS: Lines/tubes: Right chest wall port tip projects over the SVC. Lungs: Unchanged asymmetric elevation of the right hemidiaphragm. Low lung volumes with bronchovascular crowding. Hazy and patchy left lower lung opacity. Pleura: No pneumothorax or pleural effusion. Heart/mediastinum: Similar mildly enlarged cardiomediastinal silhouette. Bones: No acute osseous abnormality. IMPRESSION: Low lung volumes with bronchovascular crowding. Hazy and patchy left lower lung opacity, likely atelectasis. Aspiration or pneumonia can be considered in the appropriate clinical setting. Electronically Signed   By: Agustin Cree M.D.   On: 02/26/2024 16:30   DG Chest Port 1 View Result Date: 02/13/2024 CLINICAL DATA:  Recurrent lymphoma, port catheter placement EXAM: PORTABLE CHEST 1 VIEW COMPARISON:  07/03/2018 FINDINGS: Right IJ power port catheter tip mid SVC level. No effusion or pneumothorax. Low lung volumes. Bibasilar atelectasis. Negative for edema. Mediastinal and hilar adenopathy better appreciated by comparison PET-CT. Aorta atherosclerotic. Trachea midline. IMPRESSION: 1. Right IJ port catheter tip mid SVC level. No pneumothorax. 2. Low lung volumes with bibasilar atelectasis. Electronically Signed   By: Judie Petit.  Shick M.D.   On: 02/13/2024 13:40   DG C-Arm 1-60 Min-No Report Result Date: 02/13/2024 Fluoroscopy was utilized by the requesting physician.  No radiographic interpretation.    PERFORMANCE STATUS (ECOG) : 4 - Bedbound  Review of Systems Unable to provide  Physical Exam General: Critically ill-appearing Pulmonary: Intubated Extremities: no edema, no joint deformities Skin: no rashes Neurological: Sedated  IMPRESSION: Patient recently hospitalized with A-fib RVR and started on Eliquis.  Readmitted with GI bleed.  EGD on 3/26 positive for esophageal Candidal infection.  Patient  received cycle 1 R-CHOP on 3/28.  Unfortunately, patient acutely decompensated overnight with recurrent GI bleed and probable sepsis.  Developed multiorgan failure requiring intubation and pressors.  Patient currently on maxed pressors and high vent support with 100 send FiO2.  SPO2 currently in 70s.  I spoke with patient's wife who verbalized understanding that patient is unlikely to survive.  She wants to continue current scope of treatment until her other son  arrives but recognizes that patient could pass at any time.  She confirms DNR.  PLAN: -Continue current scope of treatment - Family confirms DNR  Case discussed with Dr. Donneta Romberg and Dr. Aundria Rud   Time Total: 45 minutes  Visit consisted of counseling and education dealing with the complex and emotionally intense issues of symptom management and palliative care in the setting of serious and potentially life-threatening illness.Greater than 50%  of this time was spent counseling and coordinating care related to the above assessment and plan.  Signed by: Laurette Schimke, PhD, NP-C

## 2024-04-10 NOTE — Progress Notes (Signed)
 I spoke patient's family and offered my condolences.  Family very thankful for the care.Appreciate the cancer center staff for the care and support provided to the patient.

## 2024-04-10 DEATH — deceased

## 2024-05-30 ENCOUNTER — Ambulatory Visit: Admitting: Nurse Practitioner

## 2024-07-23 ENCOUNTER — Other Ambulatory Visit: Payer: Medicare Other

## 2024-07-23 ENCOUNTER — Ambulatory Visit: Payer: Medicare Other | Admitting: Internal Medicine
